# Patient Record
Sex: Female | Born: 1965 | Race: White | Hispanic: No | Marital: Married | Smoking: Never smoker
Health system: Southern US, Community
[De-identification: ages and names within clinical notes are randomized; demographics above are authoritative.]

## PROBLEM LIST (undated history)

## (undated) DIAGNOSIS — F0150 Vascular dementia without behavioral disturbance: Secondary | ICD-10-CM

## (undated) DIAGNOSIS — R51 Headache: Secondary | ICD-10-CM

## (undated) DIAGNOSIS — G459 Transient cerebral ischemic attack, unspecified: Secondary | ICD-10-CM

## (undated) DIAGNOSIS — F0390 Unspecified dementia without behavioral disturbance: Secondary | ICD-10-CM

## (undated) DIAGNOSIS — I69853 Hemiplegia and hemiparesis following other cerebrovascular disease affecting right non-dominant side: Secondary | ICD-10-CM

## (undated) DIAGNOSIS — F319 Bipolar disorder, unspecified: Secondary | ICD-10-CM

## (undated) DIAGNOSIS — I10 Essential (primary) hypertension: Secondary | ICD-10-CM

## (undated) DIAGNOSIS — I69390 Apraxia following cerebral infarction: Secondary | ICD-10-CM

## (undated) DIAGNOSIS — F329 Major depressive disorder, single episode, unspecified: Secondary | ICD-10-CM

## (undated) DIAGNOSIS — E119 Type 2 diabetes mellitus without complications: Secondary | ICD-10-CM

## (undated) DIAGNOSIS — R1310 Dysphagia, unspecified: Secondary | ICD-10-CM

## (undated) DIAGNOSIS — I69320 Aphasia following cerebral infarction: Secondary | ICD-10-CM

## (undated) DIAGNOSIS — F419 Anxiety disorder, unspecified: Secondary | ICD-10-CM

## (undated) DIAGNOSIS — I639 Cerebral infarction, unspecified: Secondary | ICD-10-CM

## (undated) DIAGNOSIS — D649 Anemia, unspecified: Secondary | ICD-10-CM

## (undated) DIAGNOSIS — M6281 Muscle weakness (generalized): Secondary | ICD-10-CM

## (undated) DIAGNOSIS — E785 Hyperlipidemia, unspecified: Secondary | ICD-10-CM

## (undated) DIAGNOSIS — Z1612 Extended spectrum beta lactamase (ESBL) resistance: Secondary | ICD-10-CM

## (undated) DIAGNOSIS — I82409 Acute embolism and thrombosis of unspecified deep veins of unspecified lower extremity: Secondary | ICD-10-CM

## (undated) DIAGNOSIS — R519 Headache, unspecified: Secondary | ICD-10-CM

## (undated) HISTORY — PX: FOOT GANGLION EXCISION: SHX1660

## (undated) HISTORY — DX: Type 2 diabetes mellitus without complications: E11.9

## (undated) HISTORY — PX: VAGINAL HYSTERECTOMY: SUR661

## (undated) HISTORY — PX: HERNIA REPAIR: SHX51

## (undated) HISTORY — DX: Headache: R51

## (undated) HISTORY — DX: Headache, unspecified: R51.9

## (undated) HISTORY — PX: APPENDECTOMY: SHX54

## (undated) HISTORY — PX: CHOLECYSTECTOMY: SHX55

---

## 1998-08-19 ENCOUNTER — Encounter (HOSPITAL_COMMUNITY): Admission: RE | Admit: 1998-08-19 | Discharge: 1998-11-17 | Payer: Self-pay | Admitting: Obstetrics & Gynecology

## 1999-10-31 ENCOUNTER — Ambulatory Visit: Admission: RE | Admit: 1999-10-31 | Discharge: 1999-10-31 | Payer: Self-pay | Admitting: *Deleted

## 1999-10-31 ENCOUNTER — Encounter: Payer: Self-pay | Admitting: Internal Medicine

## 1999-11-07 ENCOUNTER — Encounter: Admission: RE | Admit: 1999-11-07 | Discharge: 1999-11-07 | Payer: Self-pay | Admitting: Internal Medicine

## 1999-11-07 ENCOUNTER — Encounter: Payer: Self-pay | Admitting: Internal Medicine

## 2010-05-13 ENCOUNTER — Other Ambulatory Visit: Payer: Self-pay | Admitting: Internal Medicine

## 2010-05-13 ENCOUNTER — Other Ambulatory Visit: Payer: Self-pay | Admitting: Gynecologic Oncology

## 2010-05-13 DIAGNOSIS — Z1231 Encounter for screening mammogram for malignant neoplasm of breast: Secondary | ICD-10-CM

## 2010-05-13 DIAGNOSIS — N644 Mastodynia: Secondary | ICD-10-CM

## 2010-05-16 ENCOUNTER — Other Ambulatory Visit: Payer: Self-pay

## 2010-05-20 ENCOUNTER — Ambulatory Visit
Admission: RE | Admit: 2010-05-20 | Discharge: 2010-05-20 | Disposition: A | Discharge status: Home / Self Care | Payer: BC Managed Care – PPO | Source: Ambulatory Visit | Attending: Internal Medicine | Admitting: Internal Medicine

## 2010-05-20 DIAGNOSIS — Z1231 Encounter for screening mammogram for malignant neoplasm of breast: Secondary | ICD-10-CM

## 2010-05-23 ENCOUNTER — Other Ambulatory Visit: Payer: Self-pay | Admitting: Internal Medicine

## 2010-05-23 DIAGNOSIS — R928 Other abnormal and inconclusive findings on diagnostic imaging of breast: Secondary | ICD-10-CM

## 2010-05-25 ENCOUNTER — Emergency Department: Payer: Self-pay | Admitting: Emergency Medicine

## 2010-05-30 ENCOUNTER — Ambulatory Visit
Admission: RE | Admit: 2010-05-30 | Discharge: 2010-05-30 | Disposition: A | Discharge status: Home / Self Care | Payer: BC Managed Care – PPO | Source: Ambulatory Visit | Attending: Internal Medicine | Admitting: Internal Medicine

## 2010-05-30 DIAGNOSIS — R928 Other abnormal and inconclusive findings on diagnostic imaging of breast: Secondary | ICD-10-CM

## 2013-10-29 ENCOUNTER — Emergency Department: Payer: Self-pay | Admitting: Emergency Medicine

## 2013-10-29 LAB — CBC WITH DIFFERENTIAL/PLATELET
BASOS ABS: 0 10*3/uL (ref 0.0–0.1)
Basophil %: 0.3 %
Eosinophil #: 0.1 10*3/uL (ref 0.0–0.7)
Eosinophil %: 1.1 %
HCT: 38.9 % (ref 35.0–47.0)
HGB: 12.9 g/dL (ref 12.0–16.0)
Lymphocyte #: 1.5 10*3/uL (ref 1.0–3.6)
Lymphocyte %: 18.4 %
MCH: 28.3 pg (ref 26.0–34.0)
MCHC: 33.1 g/dL (ref 32.0–36.0)
MCV: 85 fL (ref 80–100)
MONO ABS: 0.5 x10 3/mm (ref 0.2–0.9)
MONOS PCT: 6.4 %
NEUTROS PCT: 73.8 %
Neutrophil #: 6 10*3/uL (ref 1.4–6.5)
PLATELETS: 248 10*3/uL (ref 150–440)
RBC: 4.56 10*6/uL (ref 3.80–5.20)
RDW: 13.4 % (ref 11.5–14.5)
WBC: 8.1 10*3/uL (ref 3.6–11.0)

## 2013-10-29 LAB — URINALYSIS, COMPLETE
Bacteria: NONE SEEN
Bilirubin,UR: NEGATIVE
KETONE: NEGATIVE
LEUKOCYTE ESTERASE: NEGATIVE
Nitrite: NEGATIVE
Ph: 6 (ref 4.5–8.0)
RBC,UR: 19 /HPF (ref 0–5)
Specific Gravity: 1.02 (ref 1.003–1.030)
Squamous Epithelial: 1
WBC UR: 15 /HPF (ref 0–5)

## 2013-10-29 LAB — COMPREHENSIVE METABOLIC PANEL
ALBUMIN: 3.1 g/dL — AB (ref 3.4–5.0)
ALK PHOS: 96 U/L
ALT: 17 U/L
Anion Gap: 8 (ref 7–16)
BILIRUBIN TOTAL: 0.4 mg/dL (ref 0.2–1.0)
BUN: 13 mg/dL (ref 7–18)
CALCIUM: 8.9 mg/dL (ref 8.5–10.1)
CREATININE: 0.96 mg/dL (ref 0.60–1.30)
Chloride: 105 mmol/L (ref 98–107)
Co2: 29 mmol/L (ref 21–32)
GLUCOSE: 80 mg/dL (ref 65–99)
Osmolality: 282 (ref 275–301)
POTASSIUM: 3.9 mmol/L (ref 3.5–5.1)
SGOT(AST): 23 U/L (ref 15–37)
SODIUM: 142 mmol/L (ref 136–145)
Total Protein: 7.8 g/dL (ref 6.4–8.2)

## 2013-10-29 LAB — LIPASE, BLOOD: Lipase: 51 U/L — ABNORMAL LOW (ref 73–393)

## 2013-10-31 LAB — URINE CULTURE

## 2014-10-19 ENCOUNTER — Emergency Department: Payer: 59

## 2014-10-19 DIAGNOSIS — R51 Headache: Secondary | ICD-10-CM | POA: Diagnosis not present

## 2014-10-19 DIAGNOSIS — H53149 Visual discomfort, unspecified: Secondary | ICD-10-CM | POA: Diagnosis not present

## 2014-10-19 DIAGNOSIS — I159 Secondary hypertension, unspecified: Secondary | ICD-10-CM | POA: Diagnosis not present

## 2014-10-19 DIAGNOSIS — R110 Nausea: Secondary | ICD-10-CM | POA: Diagnosis not present

## 2014-10-19 MED ORDER — HYDROCODONE-ACETAMINOPHEN 5-325 MG PO TABS
1.0000 | ORAL_TABLET | Freq: Once | ORAL | Status: AC
  Administered 2014-10-19: 1 via ORAL

## 2014-10-19 MED ORDER — ONDANSETRON 4 MG PO TBDP
4.0000 mg | ORAL_TABLET | Freq: Once | ORAL | Status: AC
  Administered 2014-10-19: 4 mg via ORAL

## 2014-10-19 MED ORDER — HYDROCODONE-ACETAMINOPHEN 5-325 MG PO TABS
ORAL_TABLET | ORAL | Status: AC
  Administered 2014-10-19: 1 via ORAL
  Filled 2014-10-19: qty 1

## 2014-10-19 MED ORDER — ONDANSETRON 4 MG PO TBDP
ORAL_TABLET | ORAL | Status: AC
  Administered 2014-10-19: 4 mg via ORAL
  Filled 2014-10-19: qty 1

## 2014-10-19 NOTE — ED Notes (Signed)
Patient reports history of hypertension and states she has been taking medications as prescribed.

## 2014-10-19 NOTE — ED Notes (Signed)
Reports headache all day and not relieved by over the counter meds.  Reports headache pain goes into her jaw.

## 2014-10-20 ENCOUNTER — Emergency Department
Admission: EM | Admit: 2014-10-20 | Discharge: 2014-10-20 | Disposition: A | Discharge status: Home / Self Care | Payer: 59 | Attending: Emergency Medicine | Admitting: Emergency Medicine

## 2014-10-20 DIAGNOSIS — I159 Secondary hypertension, unspecified: Secondary | ICD-10-CM

## 2014-10-20 DIAGNOSIS — R519 Headache, unspecified: Secondary | ICD-10-CM

## 2014-10-20 DIAGNOSIS — R51 Headache: Secondary | ICD-10-CM

## 2014-10-20 MED ORDER — DIPHENHYDRAMINE HCL 50 MG/ML IJ SOLN
INTRAMUSCULAR | Status: AC
  Filled 2014-10-20: qty 1

## 2014-10-20 MED ORDER — DIPHENHYDRAMINE HCL 50 MG/ML IJ SOLN: 25.0000 mg | Freq: Once | INTRAMUSCULAR | Status: DC

## 2014-10-20 MED ORDER — SODIUM CHLORIDE 0.9 % IV BOLUS (SEPSIS)
1000.0000 mL | Freq: Once | INTRAVENOUS | Status: AC
  Administered 2014-10-20: 1000 mL via INTRAVENOUS

## 2014-10-20 MED ORDER — PROCHLORPERAZINE EDISYLATE 5 MG/ML IJ SOLN
10.0000 mg | Freq: Four times a day (QID) | INTRAMUSCULAR | Status: DC | PRN
  Administered 2014-10-20: 10 mg via INTRAVENOUS
  Filled 2014-10-20: qty 2

## 2014-10-20 NOTE — ED Provider Notes (Signed)
[facility] Emergency Department Provider Note   ____________________________________________  Time seen: 15  I have reviewed the triage vital signs and the nursing notes.   HISTORY  Chief Complaint Headache   History limited by: Not Limited   HPI Kelly Romero is a 49 y.o. female who presents to the emergency department today because of concerns for headache. The headache started roughly 22 hours ago. She states it is located in the frontal area. It is throbbing. She does have sensitivity to light. She states it is progressively gotten worse since that time. She is tried on home meds without any relief. States she has a history of headaches although this one is severe. She denies any fevers. Has had some nausea without any vomiting.      No past medical history on file.  There are no active problems to display for this patient.   No past surgical history on file.  No current outpatient prescriptions on file.  Allergies Erythromycin  No family history on file.  Social History History  Substance Use Topics  . Smoking status: Not on file  . Smokeless tobacco: Not on file  . Alcohol Use: Not on file    Review of Systems  Constitutional: Negative for fever. Cardiovascular: Negative for chest pain. Respiratory: Negative for shortness of breath. Gastrointestinal: Negative for abdominal pain, vomiting and diarrhea. Genitourinary: Negative for dysuria. Musculoskeletal: Negative for back pain. Skin: Negative for rash. Neurological: Positive for headache  10-point ROS otherwise negative.  ____________________________________________   PHYSICAL EXAM:  VITAL SIGNS: ED Triage Vitals  Enc Vitals Group     BP 10/19/14 2256 187/129 mmHg     Pulse Rate 10/19/14 2256 76     Resp 10/19/14 2256 20     Temp 10/19/14 2256 97.9 F (36.6 C)     Temp Source 10/19/14 2256 Oral     SpO2 10/19/14 2256 98 %     Weight 10/19/14 2256 225 lb  (102.059 kg)     Height 10/19/14 2256 5\' 2"  (1.575 m)   Constitutional: Alert and oriented. Mildly uncomfortable. Shielding her eyes. Eyes: Conjunctivae are normal. PERRL. Normal extraocular movements. ENT   Head: Normocephalic and atraumatic.   Nose: No congestion/rhinnorhea.   Mouth/Throat: Mucous membranes are moist.   Neck: No stridor. Hematological/Lymphatic/Immunilogical: No cervical lymphadenopathy. Cardiovascular: Normal rate, regular rhythm.  No murmurs, rubs, or gallops. Respiratory: Normal respiratory effort without tachypnea nor retractions. Breath sounds are clear and equal bilaterally. No wheezes/rales/rhonchi. Gastrointestinal: Soft and nontender. No distention. Genitourinary: Deferred Musculoskeletal: Normal range of motion in all extremities. No joint effusions.  No lower extremity tenderness nor edema. Neurologic:  Normal speech and language. No gross focal neurologic deficits are appreciated. Speech is normal.  Skin:  Skin is warm, dry and intact. No rash noted. Psychiatric: Mood and affect are normal. Speech and behavior are normal. Patient exhibits appropriate insight and judgment.  ____________________________________________    LABS (pertinent positives/negatives)  None  ____________________________________________   EKG  None  ____________________________________________    RADIOLOGY  CT head IMPRESSION: Normal brain  ____________________________________________   PROCEDURES  Procedure(s) performed: None  Critical Care performed: No  ____________________________________________   INITIAL IMPRESSION / ASSESSMENT AND PLAN / ED COURSE  Pertinent labs & imaging results that were available during my care of the patient were reviewed by me and considered in my medical decision making (see chart for details).  Patient presents to the emergency department with concerns for headache and high blood pressure. On  exam patient does  appear mildly uncomfortable. She is shielding her eyes. No focal neuro deficits. At this point will plan on treating headache with medications and reassessment. Head CT was negative. I have low suspicion for intracranial bleed.  Patient did have a bad reaction to the Compazine however this did resolve spontaneously. Patient did state that she felt much better after the Compazine and some fluids. Will plan on discharging home.  ____________________________________________   FINAL CLINICAL IMPRESSION(S) / ED DIAGNOSES  Final diagnoses:  Headache, unspecified headache type  Secondary hypertension, unspecified     Phineas Semen, MD 10/20/14 [phone]

## 2014-10-20 NOTE — Discharge Instructions (Signed)
Please seek medical attention for any high fevers, chest pain, shortness of breath, change in behavior, persistent vomiting, bloody stool or any other new or concerning symptoms. ° °Hypertension °Hypertension, commonly called high blood pressure, is when the force of blood pumping through your arteries is too strong. Your arteries are the blood vessels that carry blood from your heart throughout your body. A blood pressure reading consists of a higher number over a lower number, such as 110/72. The higher number (systolic) is the pressure inside your arteries when your heart pumps. The lower number (diastolic) is the pressure inside your arteries when your heart relaxes. Ideally you want your blood pressure below 120/80. °Hypertension forces your heart to work harder to pump blood. Your arteries may become narrow or stiff. Having hypertension puts you at risk for heart disease, stroke, and other problems.  °RISK FACTORS °Some risk factors for high blood pressure are controllable. Others are not.  °Risk factors you cannot control include:  °· Race. You may be at higher risk if you are African American. °· Age. Risk increases with age. °· Gender. Men are at higher risk than women before age 45 years. After age 65, women are at higher risk than men. °Risk factors you can control include: °· Not getting enough exercise or physical activity. °· Being overweight. °· Getting too much fat, sugar, calories, or salt in your diet. °· Drinking too much alcohol. °SIGNS AND SYMPTOMS °Hypertension does not usually cause signs or symptoms. Extremely high blood pressure (hypertensive crisis) may cause headache, anxiety, shortness of breath, and nosebleed. °DIAGNOSIS  °To check if you have hypertension, your health care provider will measure your blood pressure while you are seated, with your arm held at the level of your heart. It should be measured at least twice using the same arm. Certain conditions can cause a difference in  blood pressure between your right and left arms. A blood pressure reading that is higher than normal on one occasion does not mean that you need treatment. If one blood pressure reading is high, ask your health care provider about having it checked again. °TREATMENT  °Treating high blood pressure includes making lifestyle changes and possibly taking medicine. Living a healthy lifestyle can help lower high blood pressure. You may need to change some of your habits. °Lifestyle changes may include: °· Following the DASH diet. This diet is high in fruits, vegetables, and whole grains. It is low in salt, red meat, and added sugars. °· Getting at least 2½ hours of brisk physical activity every week. °· Losing weight if necessary. °· Not smoking. °· Limiting alcoholic beverages. °· Learning ways to reduce stress. ° If lifestyle changes are not enough to get your blood pressure under control, your health care provider may prescribe medicine. You may need to take more than one. Work closely with your health care provider to understand the risks and benefits. °HOME CARE INSTRUCTIONS °· Have your blood pressure rechecked as directed by your health care provider.   °· Take medicines only as directed by your health care provider. Follow the directions carefully. Blood pressure medicines must be taken as prescribed. The medicine does not work as well when you skip doses. Skipping doses also puts you at risk for problems.   °· Do not smoke.   °· Monitor your blood pressure at home as directed by your health care provider.  °SEEK MEDICAL CARE IF:  °· You think you are having a reaction to medicines taken. °· You have recurrent headaches or feel   dizzy.  You have swelling in your ankles.  You have trouble with your vision. SEEK IMMEDIATE MEDICAL CARE IF:  You develop a severe headache or confusion.  You have unusual weakness, numbness, or feel faint.  You have severe chest or abdominal pain.  You vomit repeatedly.  You  have trouble breathing. MAKE SURE YOU:   Understand these instructions.  Will watch your condition.  Will get help right away if you are not doing well or get worse. Document Released: 03/02/2005 Document Revised: 07/17/2013 Document Reviewed: 12/23/2012 [facility] Patient Information 2015 [place], [place]. This information is not intended to replace advice given to you by your health care provider. Make sure you discuss any questions you have with your health care provider.  General Headache Without Cause A headache is pain or discomfort felt around the head or neck area. The specific cause of a headache may not be found. There are many causes and types of headaches. A few common ones are:  Tension headaches.  Migraine headaches.  Cluster headaches.  Chronic daily headaches. HOME CARE INSTRUCTIONS   Keep all follow-up appointments with your caregiver or any specialist referral.  Only take over-the-counter or prescription medicines for pain or discomfort as directed by your caregiver.  Lie down in a dark, quiet room when you have a headache.  Keep a headache journal to find out what may trigger your migraine headaches. For example, write down:  What you eat and drink.  How much sleep you get.  Any change to your diet or medicines.  Try massage or other relaxation techniques.  Put ice packs or heat on the head and neck. Use these 3 to 4 times per day for 15 to 20 minutes each time, or as needed.  Limit stress.  Sit up straight, and do not tense your muscles.  Quit smoking if you smoke.  Limit alcohol use.  Decrease the amount of caffeine you drink, or stop drinking caffeine.  Eat and sleep on a regular schedule.  Get 7 to 9 hours of sleep, or as recommended by your caregiver.  Keep lights dim if bright lights bother you and make your headaches worse. SEEK MEDICAL CARE IF:   You have problems with the medicines you were prescribed.  Your medicines are not  working.  You have a change from the usual headache.  You have nausea or vomiting. SEEK IMMEDIATE MEDICAL CARE IF:   Your headache becomes severe.  You have a fever.  You have a stiff neck.  You have loss of vision.  You have muscular weakness or loss of muscle control.  You start losing your balance or have trouble walking.  You feel faint or pass out.  You have severe symptoms that are different from your first symptoms. MAKE SURE YOU:   Understand these instructions.  Will watch your condition.  Will get help right away if you are not doing well or get worse. Document Released: 03/02/2005 Document Revised: 05/25/2011 Document Reviewed: 03/18/2011 [facility] Patient Information 2015 [place], [place]. This information is not intended to replace advice given to you by your health care provider. Make sure you discuss any questions you have with your health care provider.

## 2014-11-02 ENCOUNTER — Other Ambulatory Visit: Payer: 59

## 2014-11-02 ENCOUNTER — Ambulatory Visit (INDEPENDENT_AMBULATORY_CARE_PROVIDER_SITE_OTHER): Payer: 59 | Admitting: Neurology

## 2014-11-02 ENCOUNTER — Encounter: Payer: Self-pay | Admitting: Neurology

## 2014-11-02 VITALS — BP 140/86 | HR 84 | Resp 16 | Ht 65.0 in | Wt 227.1 lb

## 2014-11-02 DIAGNOSIS — R51 Headache: Secondary | ICD-10-CM | POA: Diagnosis not present

## 2014-11-02 DIAGNOSIS — R519 Headache, unspecified: Secondary | ICD-10-CM

## 2014-11-02 LAB — CBC
HCT: 40.9 % (ref 36.0–46.0)
HEMOGLOBIN: 13.2 g/dL (ref 12.0–15.0)
MCH: 27.7 pg (ref 26.0–34.0)
MCHC: 32.3 g/dL (ref 30.0–36.0)
MCV: 85.9 fL (ref 78.0–100.0)
MPV: 11.3 fL (ref 8.6–12.4)
Platelets: 295 10*3/uL (ref 150–400)
RBC: 4.76 MIL/uL (ref 3.87–5.11)
RDW: 14.1 % (ref 11.5–15.5)
WBC: 6 10*3/uL (ref 4.0–10.5)

## 2014-11-02 LAB — COMPLETE METABOLIC PANEL WITH GFR
ALT: 11 U/L (ref 6–29)
AST: 14 U/L (ref 10–35)
Albumin: 3.5 g/dL — ABNORMAL LOW (ref 3.6–5.1)
Alkaline Phosphatase: 86 U/L (ref 33–130)
BUN: 14 mg/dL (ref 7–25)
CHLORIDE: 101 mmol/L (ref 98–110)
CO2: 29 mmol/L (ref 20–31)
Calcium: 9.1 mg/dL (ref 8.6–10.4)
Creat: 0.7 mg/dL (ref 0.50–1.05)
GFR, Est African American: >89 mL/min (ref >=60)
GFR, Est Non African American: >89 mL/min (ref >=60)
GLUCOSE: 201 mg/dL — AB (ref 65–99)
POTASSIUM: 4.3 mmol/L (ref 3.5–5.3)
SODIUM: 139 mmol/L (ref 135–146)
Total Bilirubin: 0.6 mg/dL (ref 0.2–1.2)
Total Protein: 6.5 g/dL (ref 6.1–8.1)

## 2014-11-02 MED ORDER — NORTRIPTYLINE HCL 25 MG PO CAPS: 25.0000 mg | ORAL_CAPSULE | Freq: Every day | ORAL | Status: DC

## 2014-11-02 NOTE — Patient Instructions (Addendum)
Start nortriptyline  at bedtime.  Call in 4 weeks with update Will check MRI of brain with and without contrast and MRA of head and neck [facility]  11/09/14 2:45pm  Check CBC, CMP, Sed Rate, ANA, ANCA, ENA Follow up in 8 weeks

## 2014-11-02 NOTE — Progress Notes (Signed)
NEUROLOGY CONSULTATION NOTE  ROGELIO WINBUSH MRN: [id number] DOB: 13-Jun-1965  Referring provider: Dr. Jeanie Sewer Primary care provider: Dr. Jeanie Sewer  Reason for consult:  headache  HISTORY OF PRESENT ILLNESS: Kelly Romero is a 49 year old right-handed female with history of uncontrolled diabetes, hypertension and depression who presents for new onset headache.  History obtained from patient and ED note.  CT image of head personally reviewed.  Onset:  3 weeks ago.  Sudden onset. Location:  Right sided, from front to back of head.  Initially had pain behind right ear. Quality:  squeezing Intensity:  7-8/10 Aura:  no Prodrome:  no Associated symptoms:  Some photophobia and phonophobia.  Sometimes nausea.  She denies visual disturbance. Duration:  All day Frequency:  Daily.  Sometimes wakes her up at night Triggers/exacerbating factors:  Loud noise Relieving factors:  Laying in a cool dark quiet room Activity:  6 days out of last 3 weeks, unable to function  Past abortive medication:  Tylenol, Excedrin, Percocet Past preventative medication:  none Other past therapy:  none  Current abortive medication:  Ibuprofen  Antihypertensive medications:  Lisinopril, metoprolol Antidepressant medications:  lexapro  Anticonvulsant medications:  none Vitamins/Herbal/Supplements:  none Other therapy:  none  She went to [facility] ED on 10/20/14 about 22 hours after onset of headache.  Her blood pressure was elevated at 187/129.  CT of the head was performed and was normal.  She was treated with Benadryl and Compazine. She reports that she fell and hit the right side of her head in April.  There was no loss of consciousness but it was a hard fall. She has uncontrolled diabetes.  Blood sugars are in the 200s.  She has not been hypoglycemic.  Blood pressure has been running higher lately.  Caffeine:  Coffee, cola Alcohol:  seldom Smoker:  no Diet:  Not strict diet Exercise:   no Depression/stress:  stress Sleep hygiene:  varies Family history of headache:  No.  No family history of aneurysm She has had occasional tension-type headache, but no history of migraine.  PAST MEDICAL HISTORY: Past Medical History  Diagnosis Date  . Diabetes mellitus without complication   . Headache     PAST SURGICAL HISTORY: Past Surgical History  Procedure Laterality Date  . Cholecystectomy    . Hernia repair    . Appendectomy    . Foot ganglion excision    . Vaginal hysterectomy      MEDICATIONS: No current outpatient prescriptions on file prior to visit.   No current facility-administered medications on file prior to visit.    ALLERGIES: Allergies  Allergen Reactions  . Erythromycin Itching    FAMILY HISTORY: Family History  Problem Relation Age of Onset  . COPD Mother   . Stroke Father   . Heart failure Brother   . Cancer Maternal Grandmother     unknown   . COPD    . Heart failure Maternal Grandfather   . Hypertension Maternal Grandfather   . Cancer Paternal Grandfather     lung  . Diabetes Paternal Grandmother     SOCIAL HISTORY: Social History   Social History  . Marital Status: Married    Spouse Name: N/A  . Number of Children: N/A  . Years of Education: N/A   Occupational History  . Not on file.   Social History Main Topics  . Smoking status: Never Smoker   . Smokeless tobacco: Never Used  . Alcohol Use: 0.0 oz/week    0  Standard drinks or equivalent per week     Comment: rare   . Drug Use: No  . Sexual Activity: No   Other Topics Concern  . Not on file   Social History Narrative  . No narrative on file    REVIEW OF SYSTEMS: Constitutional: No fevers, chills, or sweats, no generalized fatigue, change in appetite Eyes: No visual changes, double vision, eye pain Ear, nose and throat: No hearing loss, ear pain, nasal congestion, sore throat Cardiovascular: No chest pain, palpitations Respiratory:  No shortness of breath  at rest or with exertion, wheezes GastrointestinaI: No nausea, vomiting, diarrhea, abdominal pain, fecal incontinence Genitourinary:  No dysuria, urinary retention or frequency Musculoskeletal:  No neck pain, back pain Integumentary: No rash, pruritus, skin lesions Neurological: as above Psychiatric: No depression, insomnia, anxiety Endocrine: No palpitations, fatigue, diaphoresis, mood swings, change in appetite, change in weight, increased thirst Hematologic/Lymphatic:  No anemia, purpura, petechiae. Allergic/Immunologic: no itchy/runny eyes, nasal congestion, recent allergic reactions, rashes  PHYSICAL EXAM: Filed Vitals:   11/02/14 1255  BP: 140/86  Pulse: 84  Resp: 16   General: No acute distress.  Patient appears well-groomed.   Head:  Normocephalic/atraumatic Eyes:  fundi unremarkable, without vessel changes, exudates, hemorrhages or papilledema. Neck: supple, right sided tenderness to palpation, full range of motion Back: No paraspinal tenderness Heart: regular rate and rhythm Lungs: Clear to auscultation bilaterally. Vascular: No carotid bruits. Neurological Exam: Mental status: alert and oriented to person, place, and time, recent and remote memory intact, fund of knowledge intact, attention and concentration intact, speech fluent and not dysarthric, language intact. Cranial nerves: CN I: not tested CN II: pupils equal, round and reactive to light, visual fields intact, fundi unremarkable, without vessel changes, exudates, hemorrhages or papilledema. CN III, IV, VI:  full range of motion, no nystagmus, no ptosis CN V: facial sensation intact CN VII: upper and lower face symmetric CN VIII: hearing intact CN IX, X: gag intact, uvula midline CN XI: sternocleidomastoid and trapezius muscles intact CN XII: tongue midline Bulk & Tone: normal, no fasciculations. Motor:  5/5 throughout Sensation:  Pinprick intact.  Mildly reduced vibration sensation in toes. Deep Tendon  Reflexes:  1+ in upper extremities, absent in lower extremities.  Toes downgoing. Finger to nose testing:  intact Heel to shin:  intact Gait:  Normal station and stride.  Able to turn and walk in tandem. Romberg negative.  IMPRESSION: New onset right sided headache.  No history of migraine.  Differential includes hemicrania continua Elevated blood pressure, may be related to pain.  PLAN: 1.  Will start nortriptyline 25mg  at bedtime 2.  Check MRI of brain with and without contrast and MRA of head and neck to look for tumor, aneurysm or arterial dissection for new onset unilateral headache that wakes her up at night 3.  Check CBC, CMP, Sed Rate, ANA, ANCA, ENA 4.  BP should be rechecked with PCP 5.  Follow up in 8 weeks  Thank you for allowing me to take part in the care of this patient.  Shon Millet, DO  CC:  Gwendlyn Deutscher, MD

## 2014-11-03 LAB — SEDIMENTATION RATE: Sed Rate: 30 mm/hr — ABNORMAL HIGH (ref 0–20)

## 2014-11-05 LAB — ANA: Anti Nuclear Antibody(ANA): NEGATIVE

## 2014-11-05 LAB — ANCA SCREEN W REFLEX TITER: ANCA Screen: NEGATIVE

## 2014-11-09 ENCOUNTER — Telehealth: Payer: Self-pay | Admitting: Neurology

## 2014-11-09 ENCOUNTER — Ambulatory Visit (HOSPITAL_COMMUNITY)
Admission: RE | Admit: 2014-11-09 | Discharge: 2014-11-09 | Disposition: A | Discharge status: Home / Self Care | Payer: 59 | Source: Ambulatory Visit | Attending: Neurology | Admitting: Neurology

## 2014-11-09 ENCOUNTER — Other Ambulatory Visit (HOSPITAL_COMMUNITY): Payer: 59

## 2014-11-09 DIAGNOSIS — R51 Headache: Principal | ICD-10-CM

## 2014-11-09 DIAGNOSIS — R519 Headache, unspecified: Secondary | ICD-10-CM

## 2014-11-09 NOTE — Telephone Encounter (Signed)
Pt called and said that she couldn't relax for the MRI @ [facility]/so it wasn't done/ they suggested that she have it done @ [facility] instead?call back @ [phone]

## 2014-11-09 NOTE — Telephone Encounter (Signed)
I spoke with patient and told her that I would set up next MRI at [facility].  She also requested valium.

## 2014-11-12 NOTE — Telephone Encounter (Signed)
I called patient and informed her that I am having trouble getting through to [facility] so I have faxed referral to them and they should be calling her to set up the appointment.  Rx called in for Valium 5 mg.  Patient instructed to have a Zaveon Gillen for MRI.

## 2014-11-12 NOTE — Telephone Encounter (Signed)
Please advise about valium.  Thanks.

## 2014-11-12 NOTE — Telephone Encounter (Signed)
She can have  valium to take 15-30 minutes prior to MRI.  She will need a driver.

## 2014-11-13 ENCOUNTER — Telehealth: Payer: Self-pay | Admitting: *Deleted

## 2014-11-13 NOTE — Telephone Encounter (Signed)
Patient is aware that labs were normal

## 2014-11-16 ENCOUNTER — Telehealth: Payer: Self-pay | Admitting: Neurology

## 2014-11-16 NOTE — Telephone Encounter (Signed)
I spoke with patient and told her Morrie Sheldon would follow up with her regarding MRI at [facility]

## 2014-11-16 NOTE — Telephone Encounter (Signed)
Pt called concerning/ an MRI to be done at [facility]/ call back @ [phone]

## 2014-12-04 ENCOUNTER — Telehealth: Payer: Self-pay | Admitting: Neurology

## 2014-12-04 DIAGNOSIS — I639 Cerebral infarction, unspecified: Secondary | ICD-10-CM

## 2014-12-04 NOTE — Telephone Encounter (Signed)
Pt called and wanted to know the results of her MRI/[name] CB# [phone]

## 2014-12-04 NOTE — Telephone Encounter (Signed)
I had left a message for patient to call me back.  She returned my call and we discussed MRI results.  It showed chronic small vessel ischemic changes, which correlate with her co-morbidities of diabetes, hypertension, and hyperlipidemia.  However, it did show a probable tiny subacute stroke, which is likely incidental.  Anyway, it was recommended to repeat MRI of brain with and without contrast in 3 months for abnormal white matter on MRI of brain.  She is already on ASA and Crestor.  I also would like to check a fasting lipid panel for stroke.

## 2014-12-05 ENCOUNTER — Telehealth: Payer: Self-pay | Admitting: Neurology

## 2014-12-05 ENCOUNTER — Encounter: Payer: Self-pay | Admitting: Neurology

## 2014-12-05 NOTE — Telephone Encounter (Signed)
Pt called and needs a note of no restrictions for place of employment/[name] CB# [phone]  Work Fax# [phone]

## 2014-12-05 NOTE — Telephone Encounter (Signed)
Patient advised. Faxed letter. [facility]

## 2014-12-05 NOTE — Telephone Encounter (Signed)
Spoke with pt. Pt. Will have lab work done and will follow up with [place] in October. Thanks

## 2014-12-05 NOTE — Telephone Encounter (Signed)
Letter drafted and is in [facility]

## 2014-12-14 ENCOUNTER — Telehealth: Payer: Self-pay | Admitting: Neurology

## 2014-12-14 NOTE — Telephone Encounter (Signed)
PT called and would like a call back in regards to see if Dr Everlena Cooper wanted to see her before she went back to work, was in the hospital/[name] CB# [phone]

## 2014-12-14 NOTE — Telephone Encounter (Signed)
Dr Jaffe-  Please advise 

## 2014-12-17 NOTE — Telephone Encounter (Signed)
I have no information regarding why she was in the hospital.

## 2014-12-17 NOTE — Telephone Encounter (Signed)
Put her records in accordion  File folder under [address] per Dr Everlena Cooper

## 2014-12-17 NOTE — Telephone Encounter (Signed)
Called Marcelino Duster back from Medical Records. Will fax letter head over to [facility] for records.. Waiting on call back for fax number

## 2014-12-17 NOTE — Telephone Encounter (Signed)
Dr Everlena Cooper Patient has an appointment on 10/14 and was in [facility] ED patient felt like she was having a stroke  But was not diagnosed with a stroke  Just having symptoms   She was having high blood sugar and hypertension and headache   Dr Everlena Cooper , patient will bring those records with her to her appointment on October the 14th wants to discuss going back to work , she does not feel like she is able to work at this point until she sees you at your appointment. I explained to her that we CAN NOT give her a note out of work until you review her records. That it is her choice to be out of work at this time

## 2014-12-17 NOTE — Telephone Encounter (Signed)
Contacted patient... She was at [facility]  Will needs records for Dr Everlena Cooper to review

## 2014-12-17 NOTE — Telephone Encounter (Signed)
Marcelino Duster with [facility] in the medical records department called in regards to PT coming in to see Dr Everlena Cooper and asked if she could get something in writing to send her medical records to us/[name] CB# [phone]

## 2014-12-17 NOTE — Telephone Encounter (Signed)
Marcelino Duster called back with fax number  [phone]  Faxed letterhead to her for patients records from [facility]  For Dr Moises Blood review before her appointment on 10-14

## 2014-12-19 ENCOUNTER — Ambulatory Visit: Payer: 59 | Admitting: Neurology

## 2014-12-28 ENCOUNTER — Ambulatory Visit (INDEPENDENT_AMBULATORY_CARE_PROVIDER_SITE_OTHER): Payer: 59 | Admitting: Neurology

## 2014-12-28 ENCOUNTER — Encounter: Payer: Self-pay | Admitting: Neurology

## 2014-12-28 VITALS — BP 128/70 | HR 78 | Ht 65.0 in | Wt 225.0 lb

## 2014-12-28 DIAGNOSIS — G9341 Metabolic encephalopathy: Secondary | ICD-10-CM | POA: Diagnosis not present

## 2014-12-28 DIAGNOSIS — IMO0002 Reserved for concepts with insufficient information to code with codable children: Secondary | ICD-10-CM | POA: Insufficient documentation

## 2014-12-28 DIAGNOSIS — E1165 Type 2 diabetes mellitus with hyperglycemia: Secondary | ICD-10-CM

## 2014-12-28 DIAGNOSIS — E1142 Type 2 diabetes mellitus with diabetic polyneuropathy: Secondary | ICD-10-CM | POA: Diagnosis not present

## 2014-12-28 DIAGNOSIS — I161 Hypertensive emergency: Secondary | ICD-10-CM | POA: Diagnosis not present

## 2014-12-28 DIAGNOSIS — I10 Essential (primary) hypertension: Secondary | ICD-10-CM | POA: Diagnosis not present

## 2014-12-28 DIAGNOSIS — I639 Cerebral infarction, unspecified: Secondary | ICD-10-CM

## 2014-12-28 NOTE — Patient Instructions (Signed)
1.  Continue propranolol 30mg .  Call in 4 weeks with update of headaches 2.  Will get 2D echocardiogram and carotid doppler 3.  Will get results of lipid panel 4.  Will get updated medication list from Dr. Jeanie Seweredding 5.  Must get diabetes under control 6.  Discuss with Dr. Jeanie Seweredding regarding chest discomfort 7.  Follow up in 3 months.

## 2014-12-28 NOTE — Progress Notes (Signed)
Note routed

## 2014-12-28 NOTE — Progress Notes (Signed)
NEUROLOGY FOLLOW UP OFFICE NOTE  QUANDA PAVLICEK [id number]  HISTORY OF PRESENT ILLNESS: Kelly Romero is a 49 year old right-handed female with history of uncontrolled diabetes, hypertension and depression who follows up for headache and episode of altered mental status.  History obtained by patient, daughter and hospital notes.  Images of brain MRI, reports of head CT and labs reviewed.  UPDATE:  Sed Rate was 30.  CBC and CMP were unremarkable except for elevated glucose of 201.  MRI of the brain from 11/09/14 showed chronic small vessel ischemic changes with an incidental tiny subacute infarct in the right frontal lobe.  She reportedly had a lipid panel done, but I don't have those results.  She was in [facility] on 12/07/14 for stroke symptoms.  She was going to the funeral of her ex-sister in law, who was like a real sister.  She took her death hard.  She developed slurred speech and right arm numbness.   She was agitated, yelling and screaming in the ED.  She was given Ativan and Haldol.  CT of head was unremarkable.  Repeat CT of head two days later was negative.  CXR negative.  Urine tox screen was positive for opioids.  She was found to have severally elevated blood pressure with systolic at 240 and higher.  She was treated with IV Labetolol and Vasotec.  Hgb A1c was 12 with serum glucose level of 400.  She did not have fever or leukocytosis, and UA was borderline with trace leukocytes but negative nitrite.  She was treated with 5 days of IV Rocephin.  Troponins were negative.  Etiology was unknown but TIA was suspected as a possibility.  Headaches have improved, but they are still present.  She never started nortriptyline.  Her PCP just increased her propranolol from  to  daily.  Sometimes, she notes numbness in the left arm and leg.  She also notes some chest discomfort.  HISTORY: Onset:  July.  Sudden onset. Location:  Right sided, from front to back of head.   Initially had pain behind right ear. Quality:  squeezing Initial intensity:  7-8/10 Aura:  no Prodrome:  no Associated symptoms:  Some photophobia and phonophobia.  Sometimes nausea.  She denies visual disturbance. Initial Duration:  All day Initial Frequency:  Daily.  Sometimes wakes her up at night Triggers/exacerbating factors:  Loud noise Relieving factors:  Laying in a cool dark quiet room Activity:  6 days out of last 3 weeks, unable to function  Past abortive medication:  Tylenol, Excedrin, Percocet Past preventative medication:  none Other past therapy:  none  Current abortive medication:  Ibuprofen  Antihypertensive medications:  Lisinopril, metoprolol Antidepressant medications:  lexapro  Anticonvulsant medications:  none Vitamins/Herbal/Supplements:  none Other therapy:  none  She went to [facility] ED on 10/20/14 about 22 hours after onset of headache.  Her blood pressure was elevated at 187/129.  CT of the head was performed and was normal.  She was treated with Benadryl and Compazine. She reports that she fell and hit the right side of her head in April.  There was no loss of consciousness but it was a hard fall. She has uncontrolled diabetes.  Blood sugars are in the 200s.  She has not been hypoglycemic.  Blood pressure has been running higher lately.  PAST MEDICAL HISTORY: Past Medical History  Diagnosis Date  . Diabetes mellitus without complication (HCC)   . Headache     MEDICATIONS: Current Outpatient Prescriptions  on File Prior to Visit  Medication Sig Dispense Refill  . aspirin EC 81 MG tablet Take 81 mg by mouth.    . escitalopram (LEXAPRO) 10 MG tablet Take 10 mg by mouth.    . Insulin Degludec 100 UNIT/ML SOPN Inject into the skin.    Marland Kitchen lisinopril (PRINIVIL,ZESTRIL) 2.5 MG tablet Take 2.5 mg by mouth.    . metoprolol succinate (TOPROL-XL) 50 MG 24 hr tablet Take 50 mg by mouth daily. Take with or immediately following a meal.    . nortriptyline  (PAMELOR) 25 MG capsule Take 1 capsule (25 mg total) by mouth at bedtime. (Patient not taking: Reported on 12/28/2014) 30 capsule 2  . propranolol (INDERAL) 20 MG tablet Take 20 mg by mouth.    . rosuvastatin (CRESTOR) 10 MG tablet Take 10 mg by mouth.     No current facility-administered medications on file prior to visit.    ALLERGIES: Allergies  Allergen Reactions  . Erythromycin Itching    FAMILY HISTORY: Family History  Problem Relation Age of Onset  . COPD Mother   . Stroke Father   . Heart failure Brother   . Cancer Maternal Grandmother     unknown   . COPD    . Heart failure Maternal Grandfather   . Hypertension Maternal Grandfather   . Cancer Paternal Grandfather     lung  . Diabetes Paternal Grandmother     SOCIAL HISTORY: Social History   Social History  . Marital Status: Married    Spouse Name: N/A  . Number of Children: N/A  . Years of Education: N/A   Occupational History  . Not on file.   Social History Main Topics  . Smoking status: Never Smoker   . Smokeless tobacco: Never Used  . Alcohol Use: 0.0 oz/week    0 Standard drinks or equivalent per week     Comment: rare   . Drug Use: No  . Sexual Activity: No   Other Topics Concern  . Not on file   Social History Narrative    REVIEW OF SYSTEMS: Constitutional: No fevers, chills, or sweats, no generalized fatigue, change in appetite Eyes: No visual changes, double vision, eye pain Ear, nose and throat: No hearing loss, ear pain, nasal congestion, sore throat Cardiovascular: No chest pain, palpitations Respiratory:  No shortness of breath at rest or with exertion, wheezes GastrointestinaI: No nausea, vomiting, diarrhea, abdominal pain, fecal incontinence Genitourinary:  No dysuria, urinary retention or frequency Musculoskeletal:  No neck pain, back pain Integumentary: No rash, pruritus, skin lesions Neurological: as above Psychiatric: No depression, insomnia, anxiety Endocrine: No  palpitations, fatigue, diaphoresis, mood swings, change in appetite, change in weight, increased thirst Hematologic/Lymphatic:  No anemia, purpura, petechiae. Allergic/Immunologic: no itchy/runny eyes, nasal congestion, recent allergic reactions, rashes  PHYSICAL EXAM: Filed Vitals:   12/28/14 1058  BP: 128/70  Pulse: 78   General: No acute distress.  Patient appears well-groomed.   Head:  Normocephalic/atraumatic Eyes:  Fundoscopic exam unremarkable without vessel changes, exudates, hemorrhages or papilledema. Neck: supple, no paraspinal tenderness, full range of motion Heart:  Regular rate and rhythm Lungs:  Clear to auscultation bilaterally Back: No paraspinal tenderness Neurological Exam: alert and oriented to person, place, and time. Attention span and concentration intact, recent and remote memory intact, fund of knowledge intact.  Speech fluent and not dysarthric, language intact.  CN II-XII intact. Fundoscopic exam unremarkable without vessel changes, exudates, hemorrhages or papilledema.  Bulk and tone normal, muscle strength 5/5 throughout.  Sensation to temperature and vibration reduced in feet.  Deep tendon reflexes 2+ in upper extremities and absent in lower extremities, toes downgoing.  Finger to nose and heel to shin testing intact.  Gait normal.  IMPRESSION: Metabolic encephalopathy and hypertensive encephalopathy.  I really do not suspect a stroke, although it cannot be ruled out. CVA, as per incidental finding on MRI Unspecified headache Hypertension Uncontrolled type 2 diabetes.  PLAN: ASA Continue Crestor 10mg  daily. We will check and see if a lipid panel was performed.  If not, this will be done.  LDL goal should be less than 70. Check carotid doppler and 2D echo to complete stroke workup Glycemic and blood pressure control Propranolol 30mg  daily.  She is to call in 4 weeks with update of headache and we can make adjustments if needed. Advised that she contact Dr.  Jeanie Seweredding to inform him about her chest discomfort. Follow up in 3 months.  Shon MilletAdam Eulogia Dismore, DO  CC:  Gwendlyn DeutscherJohn [name], MD

## 2015-01-02 ENCOUNTER — Encounter (HOSPITAL_COMMUNITY): Payer: 59

## 2015-01-04 ENCOUNTER — Other Ambulatory Visit (HOSPITAL_COMMUNITY): Payer: 59

## 2015-01-04 ENCOUNTER — Inpatient Hospital Stay (HOSPITAL_COMMUNITY): Admission: RE | Admit: 2015-01-04 | Payer: 59 | Source: Ambulatory Visit

## 2015-01-07 ENCOUNTER — Other Ambulatory Visit: Payer: Self-pay | Admitting: Neurology

## 2015-01-07 DIAGNOSIS — G9341 Metabolic encephalopathy: Secondary | ICD-10-CM

## 2015-01-07 DIAGNOSIS — R4701 Aphasia: Secondary | ICD-10-CM

## 2015-01-07 DIAGNOSIS — I639 Cerebral infarction, unspecified: Secondary | ICD-10-CM

## 2015-01-07 DIAGNOSIS — R200 Anesthesia of skin: Secondary | ICD-10-CM

## 2015-01-09 ENCOUNTER — Other Ambulatory Visit: Payer: Self-pay | Admitting: Neurology

## 2015-01-09 ENCOUNTER — Encounter: Payer: Self-pay | Admitting: Neurology

## 2015-01-11 ENCOUNTER — Encounter: Payer: Self-pay | Admitting: Neurology

## 2015-01-14 ENCOUNTER — Ambulatory Visit (HOSPITAL_COMMUNITY)
Admission: RE | Admit: 2015-01-14 | Discharge: 2015-01-14 | Disposition: A | Discharge status: Home / Self Care | Payer: 59 | Source: Ambulatory Visit | Attending: Cardiology | Admitting: Cardiology

## 2015-01-14 ENCOUNTER — Other Ambulatory Visit: Payer: Self-pay

## 2015-01-14 ENCOUNTER — Telehealth: Payer: Self-pay

## 2015-01-14 ENCOUNTER — Ambulatory Visit (HOSPITAL_BASED_OUTPATIENT_CLINIC_OR_DEPARTMENT_OTHER): Payer: 59

## 2015-01-14 DIAGNOSIS — I5189 Other ill-defined heart diseases: Secondary | ICD-10-CM | POA: Insufficient documentation

## 2015-01-14 DIAGNOSIS — Q211 Atrial septal defect: Secondary | ICD-10-CM | POA: Insufficient documentation

## 2015-01-14 DIAGNOSIS — G9341 Metabolic encephalopathy: Secondary | ICD-10-CM

## 2015-01-14 DIAGNOSIS — E119 Type 2 diabetes mellitus without complications: Secondary | ICD-10-CM | POA: Insufficient documentation

## 2015-01-14 DIAGNOSIS — I161 Hypertensive emergency: Secondary | ICD-10-CM | POA: Diagnosis not present

## 2015-01-14 DIAGNOSIS — I6523 Occlusion and stenosis of bilateral carotid arteries: Secondary | ICD-10-CM | POA: Insufficient documentation

## 2015-01-14 DIAGNOSIS — I517 Cardiomegaly: Secondary | ICD-10-CM | POA: Diagnosis not present

## 2015-01-14 DIAGNOSIS — I071 Rheumatic tricuspid insufficiency: Secondary | ICD-10-CM | POA: Diagnosis not present

## 2015-01-14 DIAGNOSIS — I340 Nonrheumatic mitral (valve) insufficiency: Secondary | ICD-10-CM | POA: Insufficient documentation

## 2015-01-14 DIAGNOSIS — E1142 Type 2 diabetes mellitus with diabetic polyneuropathy: Secondary | ICD-10-CM

## 2015-01-14 DIAGNOSIS — I639 Cerebral infarction, unspecified: Secondary | ICD-10-CM | POA: Diagnosis not present

## 2015-01-14 DIAGNOSIS — I10 Essential (primary) hypertension: Secondary | ICD-10-CM

## 2015-01-14 DIAGNOSIS — R200 Anesthesia of skin: Secondary | ICD-10-CM | POA: Diagnosis not present

## 2015-01-14 DIAGNOSIS — R4701 Aphasia: Secondary | ICD-10-CM | POA: Diagnosis not present

## 2015-01-14 DIAGNOSIS — E1165 Type 2 diabetes mellitus with hyperglycemia: Secondary | ICD-10-CM

## 2015-01-14 NOTE — Telephone Encounter (Signed)
Message left on pt's vm

## 2015-01-14 NOTE — Telephone Encounter (Signed)
-----   Message from Drema DallasAdam R Jaffe, DO sent at 01/14/2015 12:05 PM EDT ----- 2D echo shows a small hole in the heart, which some people have.  However, it is nothing that would change current management for stroke prevention.

## 2015-01-15 ENCOUNTER — Telehealth: Payer: Self-pay

## 2015-01-15 DIAGNOSIS — R519 Headache, unspecified: Secondary | ICD-10-CM

## 2015-01-15 DIAGNOSIS — G9341 Metabolic encephalopathy: Secondary | ICD-10-CM

## 2015-01-15 DIAGNOSIS — R51 Headache: Secondary | ICD-10-CM

## 2015-01-15 NOTE — Telephone Encounter (Signed)
-----   Message from Drema DallasAdam R Jaffe, DO sent at 01/15/2015  2:03 PM EDT ----- Carotid doppler shows some narrowing of the right carotid artery.  Management would be continuing the aspirin and statin.  Is she taking Crestor?  Also, I would like to check a fasting lipid panel because I never received those results.

## 2015-01-15 NOTE — Telephone Encounter (Signed)
Patient aware.

## 2015-01-15 NOTE — Telephone Encounter (Signed)
Patient aware. Pt is taking Crestor.  Will fax lab order to PCP - Dr. Gwendlyn DeutscherJohn [name] [facility] [place] fax # [phone]. Patient will go get fasting lipid drawn.

## 2015-04-15 ENCOUNTER — Ambulatory Visit (INDEPENDENT_AMBULATORY_CARE_PROVIDER_SITE_OTHER): Payer: 59 | Admitting: Neurology

## 2015-04-15 ENCOUNTER — Telehealth: Payer: Self-pay

## 2015-04-15 ENCOUNTER — Encounter: Payer: Self-pay | Admitting: Neurology

## 2015-04-15 VITALS — BP 138/86 | HR 98 | Ht 64.5 in | Wt 230.0 lb

## 2015-04-15 DIAGNOSIS — G44219 Episodic tension-type headache, not intractable: Secondary | ICD-10-CM

## 2015-04-15 DIAGNOSIS — I639 Cerebral infarction, unspecified: Secondary | ICD-10-CM

## 2015-04-15 DIAGNOSIS — I10 Essential (primary) hypertension: Secondary | ICD-10-CM

## 2015-04-15 DIAGNOSIS — E1165 Type 2 diabetes mellitus with hyperglycemia: Secondary | ICD-10-CM

## 2015-04-15 DIAGNOSIS — E1142 Type 2 diabetes mellitus with diabetic polyneuropathy: Secondary | ICD-10-CM

## 2015-04-15 DIAGNOSIS — G250 Essential tremor: Secondary | ICD-10-CM

## 2015-04-15 NOTE — Progress Notes (Signed)
Chart forwarded.  

## 2015-04-15 NOTE — Progress Notes (Signed)
NEUROLOGY FOLLOW UP OFFICE NOTE  SHANNELLE Romero [id number]  HISTORY OF PRESENT ILLNESS: Kelly Romero is a 50 year old right-handed female with history of uncontrolled diabetes, hypertension and depression who follows up for headache and stroke.  History obtained by patient and hospital notes from [facility].  MRI report and labs reviewed.  UPDATE: She had a similar stroke-like episode on 02/22/15.  She woke up with headache and later developed slurred speech.  She was admitted to [facility], where MRI of brain revealed 2 mm left occipital subacute infarct.  2D echo was performed, which appeared unremarkable, but didn't make mention of a PFO.  Carotid doppler again showed no hemodynamically significant ICA stenosis.  Hypercoagulable panel, ANA, lupus panel, antiextractable nuclear antigen were negative.  LDL was 130.  I am uncertain how elevated was her blood pressure.  Plavix was added to her ASA.   At some point, propranolol was discontinued and she was started on amitriptyline  at bedtime.  Headaches are improved.  They occur once every 2 weeks and respond to ibuprofen , sometimes requiring hydrocodone.  HISTORY: Onset:  July.  Sudden onset. Location:  Right sided, from front to back of head.  Initially had pain behind right ear. Quality:  squeezing Initial intensity:  7-8/10 Aura:  no Prodrome:  no Associated symptoms:  Some photophobia and phonophobia.  Sometimes nausea.  She denies visual disturbance. Initial Duration:  All day Initial Frequency:  Daily.  Sometimes wakes her up at night Triggers/exacerbating factors:  Loud noise Relieving factors:  Laying in a cool dark quiet room Activity:  6 days out of last 3 weeks, unable to function  Past abortive medication:  Tylenol, Excedrin, Percocet Past preventative medication:  none Other past therapy:  none  Current abortive medication:  Ibuprofen  Antihypertensive medications:  Lisinopril,  metoprolol Antidepressant medications:  lexapro  Anticonvulsant medications:  none Vitamins/Herbal/Supplements:  none Other therapy:  none  Sed Rate was 30.  CBC and CMP were unremarkable except for elevated glucose of 201.  MRI of the brain from 11/09/14 showed chronic small vessel ischemic changes with an incidental tiny subacute infarct in the right frontal lobe.    Encephalopathy/Stroke: She was in [facility] on 12/07/14 for stroke symptoms.  She developed slurred speech and right arm numbness.   She was agitated, yelling and screaming in the ED.  She was given Ativan and Haldol.  CT of head was unremarkable.  Repeat CT of head two days later was negative.  CXR negative.  Urine tox screen was positive for opioids.  She was found to have severally elevated blood pressure with systolic at 240 and higher.  She was treated with IV Labetolol and Vasotec.  Hgb A1c was 12 with serum glucose level of 400.  She did not have fever or leukocytosis, and UA was borderline with trace leukocytes but negative nitrite.  She was treated with 5 days of IV Rocephin.  Troponins were negative.  Etiology was unknown but TIA was suspected as a possibility.  PAST MEDICAL HISTORY: Past Medical History  Diagnosis Date  . Diabetes mellitus without complication (HCC)   . Headache     MEDICATIONS: Current Outpatient Prescriptions on File Prior to Visit  Medication Sig Dispense Refill  . aspirin EC 81 MG tablet Take 81 mg by mouth.    . escitalopram (LEXAPRO) 10 MG tablet Take 10 mg by mouth.    . Insulin Degludec 100 UNIT/ML SOPN Inject into the skin.    Marland Kitchen  lisinopril (PRINIVIL,ZESTRIL) 2.5 MG tablet Take 2.5 mg by mouth.    . rosuvastatin (CRESTOR) 10 MG tablet Take 10 mg by mouth.     No current facility-administered medications on file prior to visit.    ALLERGIES: Allergies  Allergen Reactions  . Erythromycin Itching    FAMILY HISTORY: Family History  Problem Relation Age of Onset  . COPD  Mother   . Stroke Father   . Heart failure Brother   . Cancer Maternal Grandmother     unknown   . COPD    . Heart failure Maternal Grandfather   . Hypertension Maternal Grandfather   . Cancer Paternal Grandfather     lung  . Diabetes Paternal Grandmother     SOCIAL HISTORY: Social History   Social History  . Marital Status: Married    Spouse Name: N/A  . Number of Children: N/A  . Years of Education: N/A   Occupational History  . Not on file.   Social History Main Topics  . Smoking status: Never Smoker   . Smokeless tobacco: Never Used  . Alcohol Use: 0.0 oz/week    0 Standard drinks or equivalent per week     Comment: rare   . Drug Use: No  . Sexual Activity: No   Other Topics Concern  . Not on file   Social History Narrative    REVIEW OF SYSTEMS: Constitutional: No fevers, chills, or sweats, no generalized fatigue, change in appetite Eyes: No visual changes, double vision, eye pain Ear, nose and throat: No hearing loss, ear pain, nasal congestion, sore throat Cardiovascular: No chest pain, palpitations Respiratory:  No shortness of breath at rest or with exertion, wheezes GastrointestinaI: No nausea, vomiting, diarrhea, abdominal pain, fecal incontinence Genitourinary:  No dysuria, urinary retention or frequency Musculoskeletal:  No neck pain, back pain Integumentary: No rash, pruritus, skin lesions Neurological: as above Psychiatric: No depression, insomnia, anxiety Endocrine: No palpitations, fatigue, diaphoresis, mood swings, change in appetite, change in weight, increased thirst Hematologic/Lymphatic:  No anemia, purpura, petechiae. Allergic/Immunologic: no itchy/runny eyes, nasal congestion, recent allergic reactions, rashes  PHYSICAL EXAM: Filed Vitals:   04/15/15 1339  BP: 138/86  Pulse: 98   General: No acute distress.  Patient appears well-groomed.   Head:  Normocephalic/atraumatic Eyes:  Fundoscopic exam unremarkable without vessel changes,  exudates, hemorrhages or papilledema. Neck: supple, no paraspinal tenderness, full range of motion Heart:  Regular rate and rhythm Lungs:  Clear to auscultation bilaterally Back: No paraspinal tenderness Neurological Exam: alert and oriented to person, place, and time. Attention span and concentration intact, recent and remote memory intact, fund of knowledge intact.  Speech fluent and not dysarthric, language intact.  CN II-XII intact. Fundoscopic exam unremarkable without vessel changes, exudates, hemorrhages or papilledema.  Bulk and tone normal, muscle strength 5/5 throughout.  Sensation to light touch, temperature and vibration intact.  Deep tendon reflexes 2+ throughout, toes downgoing.  Finger to nose with postural and kinetic tremor, heel to shin testing intact.  Gait normal, Romberg negative.  IMPRESSION: CVA.  There is no mention of PFO on repeat TTE at [facility] Hypertension Uncontrolled type 2 diabetes mellitus Hyperlipidemia Probable tension type headache  PLAN: 1.  Continue ASA  daily and Plavix  daily.  In one month, we can discontinue ASA and just continue Plavix 2.  Continue Crestor.  Check fasting lipid panel in one month and adjust dose if needed (LDL goal less than 70) 3.  Will get TEE.  If a PFO  is confirmed, may consider referral to cardiology for PFO closure, since this is the second stroke and both occurred in different vascular territories 4.  Continue amitriptyline  daily for now 5.  Ibuprofen  for abortive therapy, try to avoid hydrocodone 6.  Monitor tremor for now. 7.  Optimize glycemic control and continue BP control 8.  Follow up in 6 months  Shon Millet, DO  CC: Gwendlyn Deutscher, MD

## 2015-04-15 NOTE — Telephone Encounter (Signed)
Received order from Neurology (Dr. Everlena Cooper) for TEE for TIA. Scheduled patient Friday, April 19, 2015 with Dr. Anne Fu.  Reviewed instructions in detail with patient. Instructional letter and e-mail sent per patient request. She understands to call with further questions or concerns.

## 2015-04-15 NOTE — Telephone Encounter (Signed)
Opened in error

## 2015-04-15 NOTE — Patient Instructions (Signed)
1. Try to avoid using the hydrocodone, Take ibfrophen for your migraines. No more than 2 days a week, to avoid rebound headaches.  2. Continue amtriptyline 25 mg.  3. Continue aspirin 81 mg and Plavix.  4. Try to get your Diabetes undercontrol 5. Follow up in 6 months.

## 2015-04-18 ENCOUNTER — Telehealth: Payer: Self-pay | Admitting: Cardiology

## 2015-04-18 NOTE — Telephone Encounter (Signed)
Reviewed instructions with patient who is aware she may have clear liquids including water, broth, sprite,ginger ale, black coffee, tea (no sugar), cranberry,grape, apple juice, jello (not red) and/or popsicles from clear juices up until 7 am.  She will keep her appt for TEE as scheduled for 04/19/2015.

## 2015-04-18 NOTE — Telephone Encounter (Signed)
Pt is having a procedure tomorrow at 2. She wants to know if you have anything earlier,pt is a diabetic. She wants to know if she can eat anything before procedure.

## 2015-04-19 ENCOUNTER — Encounter (HOSPITAL_COMMUNITY): Payer: Self-pay

## 2015-04-19 ENCOUNTER — Ambulatory Visit (HOSPITAL_COMMUNITY)
Admission: RE | Admit: 2015-04-19 | Discharge: 2015-04-19 | Disposition: A | Discharge status: Home / Self Care | Payer: 59 | Source: Ambulatory Visit | Attending: Cardiology | Admitting: Cardiology

## 2015-04-19 ENCOUNTER — Ambulatory Visit (HOSPITAL_BASED_OUTPATIENT_CLINIC_OR_DEPARTMENT_OTHER): Payer: 59

## 2015-04-19 ENCOUNTER — Encounter (HOSPITAL_COMMUNITY)
Admission: RE | Disposition: A | Discharge status: Home / Self Care | Payer: Self-pay | Source: Ambulatory Visit | Attending: Cardiology

## 2015-04-19 DIAGNOSIS — Z8673 Personal history of transient ischemic attack (TIA), and cerebral infarction without residual deficits: Secondary | ICD-10-CM | POA: Diagnosis not present

## 2015-04-19 DIAGNOSIS — Z79899 Other long term (current) drug therapy: Secondary | ICD-10-CM | POA: Diagnosis not present

## 2015-04-19 DIAGNOSIS — I639 Cerebral infarction, unspecified: Secondary | ICD-10-CM | POA: Diagnosis not present

## 2015-04-19 DIAGNOSIS — F329 Major depressive disorder, single episode, unspecified: Secondary | ICD-10-CM | POA: Insufficient documentation

## 2015-04-19 DIAGNOSIS — E785 Hyperlipidemia, unspecified: Secondary | ICD-10-CM | POA: Insufficient documentation

## 2015-04-19 DIAGNOSIS — E1165 Type 2 diabetes mellitus with hyperglycemia: Secondary | ICD-10-CM | POA: Insufficient documentation

## 2015-04-19 DIAGNOSIS — I340 Nonrheumatic mitral (valve) insufficiency: Secondary | ICD-10-CM | POA: Insufficient documentation

## 2015-04-19 DIAGNOSIS — I10 Essential (primary) hypertension: Secondary | ICD-10-CM | POA: Diagnosis not present

## 2015-04-19 HISTORY — DX: Hyperlipidemia, unspecified: E78.5

## 2015-04-19 HISTORY — PX: TEE WITHOUT CARDIOVERSION: SHX5443

## 2015-04-19 HISTORY — DX: Essential (primary) hypertension: I10

## 2015-04-19 LAB — GLUCOSE, CAPILLARY: GLUCOSE-CAPILLARY: 149 mg/dL — AB (ref 65–99)

## 2015-04-19 SURGERY — ECHOCARDIOGRAM, TRANSESOPHAGEAL
Anesthesia: Moderate Sedation

## 2015-04-19 MED ORDER — HYDRALAZINE HCL 20 MG/ML IJ SOLN
INTRAMUSCULAR | Status: DC | PRN
  Administered 2015-04-19: 10 mg via INTRAVENOUS

## 2015-04-19 MED ORDER — MIDAZOLAM HCL 5 MG/ML IJ SOLN
INTRAMUSCULAR | Status: AC
Start: 2015-04-19 — End: 2015-04-19
  Filled 2015-04-19: qty 2

## 2015-04-19 MED ORDER — LIDOCAINE VISCOUS 2 % MT SOLN
OROMUCOSAL | Status: AC
  Filled 2015-04-19: qty 15

## 2015-04-19 MED ORDER — BUTAMBEN-TETRACAINE-BENZOCAINE 2-2-14 % EX AERO
INHALATION_SPRAY | CUTANEOUS | Status: DC | PRN
  Administered 2015-04-19: 2 via TOPICAL

## 2015-04-19 MED ORDER — FENTANYL CITRATE (PF) 100 MCG/2ML IJ SOLN
INTRAMUSCULAR | Status: AC
Start: 2015-04-19 — End: 2015-04-19
  Filled 2015-04-19: qty 2

## 2015-04-19 MED ORDER — MIDAZOLAM HCL 10 MG/2ML IJ SOLN
INTRAMUSCULAR | Status: DC | PRN
Start: 2015-04-19 — End: 2015-04-19
  Administered 2015-04-19: 2 mg via INTRAVENOUS
  Administered 2015-04-19: 1 mg via INTRAVENOUS
  Administered 2015-04-19: 2 mg via INTRAVENOUS

## 2015-04-19 MED ORDER — HYDRALAZINE HCL 20 MG/ML IJ SOLN
INTRAMUSCULAR | Status: AC
Start: 2015-04-19 — End: 2015-04-19
  Filled 2015-04-19: qty 1

## 2015-04-19 MED ORDER — FENTANYL CITRATE (PF) 100 MCG/2ML IJ SOLN
INTRAMUSCULAR | Status: DC | PRN
  Administered 2015-04-19 (×3): 25 ug via INTRAVENOUS

## 2015-04-19 NOTE — Interval H&P Note (Signed)
History and Physical Interval Note:  04/19/2015 12:48 PM  Kelly Romero  has presented today for surgery, with the diagnosis of TIA  The various methods of treatment have been discussed with the patient and family. After consideration of risks, benefits and other options for treatment, the patient has consented to  Procedure(s): TRANSESOPHAGEAL ECHOCARDIOGRAM (TEE) (N/A) as a surgical intervention .  [name] history has been reviewed, patient examined, no change in status, stable for surgery.  I have reviewed the patient's chart and labs.  Questions were answered to the patient's satisfaction.     SKAINS, MARK

## 2015-04-19 NOTE — H&P (View-Only) (Signed)
 NEUROLOGY FOLLOW UP OFFICE NOTE  Kelly Romero [id number]  HISTORY OF PRESENT ILLNESS: Kelly Romero is a 50 year old right-handed female with history of uncontrolled diabetes, hypertension and depression who follows up for headache and stroke.  History obtained by patient and hospital notes from [facility].  MRI report and labs reviewed.  UPDATE: She had a similar stroke-like episode on 02/22/15.  She woke up with headache and later developed slurred speech.  She was admitted to [facility], where MRI of brain revealed 2 mm left occipital subacute infarct.  2D echo was performed, which appeared unremarkable, but didn't make mention of a PFO.  Carotid doppler again showed no hemodynamically significant ICA stenosis.  Hypercoagulable panel, ANA, lupus panel, antiextractable nuclear antigen were negative.  LDL was 130.  I am uncertain how elevated was her blood pressure.  Plavix was added to her ASA.   At some point, propranolol was discontinued and she was started on amitriptyline 25mg at bedtime.  Headaches are improved.  They occur once every 2 weeks and respond to ibuprofen 800mg, sometimes requiring hydrocodone.  HISTORY: Onset:  July.  Sudden onset. Location:  Right sided, from front to back of head.  Initially had pain behind right ear. Quality:  squeezing Initial intensity:  7-8/10 Aura:  no Prodrome:  no Associated symptoms:  Some photophobia and phonophobia.  Sometimes nausea.  She denies visual disturbance. Initial Duration:  All day Initial Frequency:  Daily.  Sometimes wakes her up at night Triggers/exacerbating factors:  Loud noise Relieving factors:  Laying in a cool dark quiet room Activity:  6 days out of last 3 weeks, unable to function  Past abortive medication:  Tylenol, Excedrin, Percocet Past preventative medication:  none Other past therapy:  none  Current abortive medication:  Ibuprofen 800mg Antihypertensive medications:  Lisinopril,  metoprolol Antidepressant medications:  lexapro 10mg Anticonvulsant medications:  none Vitamins/Herbal/Supplements:  none Other therapy:  none  Sed Rate was 30.  CBC and CMP were unremarkable except for elevated glucose of 201.  MRI of the brain from 11/09/14 showed chronic small vessel ischemic changes with an incidental tiny subacute infarct in the right frontal lobe.    Encephalopathy/Stroke: She was in [facility] on 12/07/14 for stroke symptoms.  She developed slurred speech and right arm numbness.   She was agitated, yelling and screaming in the ED.  She was given Ativan and Haldol.  CT of head was unremarkable.  Repeat CT of head two days later was negative.  CXR negative.  Urine tox screen was positive for opioids.  She was found to have severally elevated blood pressure with systolic at 240 and higher.  She was treated with IV Labetolol and Vasotec.  Hgb A1c was 12 with serum glucose level of 400.  She did not have fever or leukocytosis, and UA was borderline with trace leukocytes but negative nitrite.  She was treated with 5 days of IV Rocephin.  Troponins were negative.  Etiology was unknown but TIA was suspected as a possibility.  PAST MEDICAL HISTORY: Past Medical History  Diagnosis Date  . Diabetes mellitus without complication (HCC)   . Headache     MEDICATIONS: Current Outpatient Prescriptions on File Prior to Visit  Medication Sig Dispense Refill  . aspirin EC 81 MG tablet Take 81 mg by mouth.    . escitalopram (LEXAPRO) 10 MG tablet Take 10 mg by mouth.    . Insulin Degludec 100 UNIT/ML SOPN Inject into the skin.    .   lisinopril (PRINIVIL,ZESTRIL) 2.5 MG tablet Take 2.5 mg by mouth.    . rosuvastatin (CRESTOR) 10 MG tablet Take 10 mg by mouth.     No current facility-administered medications on file prior to visit.    ALLERGIES: Allergies  Allergen Reactions  . Erythromycin Itching    FAMILY HISTORY: Family History  Problem Relation Age of Onset  . COPD  Mother   . Stroke Father   . Heart failure Brother   . Cancer Maternal Grandmother     unknown   . COPD    . Heart failure Maternal Grandfather   . Hypertension Maternal Grandfather   . Cancer Paternal Grandfather     lung  . Diabetes Paternal Grandmother     SOCIAL HISTORY: Social History   Social History  . Marital Status: Married    Spouse Name: N/A  . Number of Children: N/A  . Years of Education: N/A   Occupational History  . Not on file.   Social History Main Topics  . Smoking status: Never Smoker   . Smokeless tobacco: Never Used  . Alcohol Use: 0.0 oz/week    0 Standard drinks or equivalent per week     Comment: rare   . Drug Use: No  . Sexual Activity: No   Other Topics Concern  . Not on file   Social History Narrative    REVIEW OF SYSTEMS: Constitutional: No fevers, chills, or sweats, no generalized fatigue, change in appetite Eyes: No visual changes, double vision, eye pain Ear, nose and throat: No hearing loss, ear pain, nasal congestion, sore throat Cardiovascular: No chest pain, palpitations Respiratory:  No shortness of breath at rest or with exertion, wheezes GastrointestinaI: No nausea, vomiting, diarrhea, abdominal pain, fecal incontinence Genitourinary:  No dysuria, urinary retention or frequency Musculoskeletal:  No neck pain, back pain Integumentary: No rash, pruritus, skin lesions Neurological: as above Psychiatric: No depression, insomnia, anxiety Endocrine: No palpitations, fatigue, diaphoresis, mood swings, change in appetite, change in weight, increased thirst Hematologic/Lymphatic:  No anemia, purpura, petechiae. Allergic/Immunologic: no itchy/runny eyes, nasal congestion, recent allergic reactions, rashes  PHYSICAL EXAM: Filed Vitals:   04/15/15 1339  BP: 138/86  Pulse: 98   General: No acute distress.  Patient appears well-groomed.   Head:  Normocephalic/atraumatic Eyes:  Fundoscopic exam unremarkable without vessel changes,  exudates, hemorrhages or papilledema. Neck: supple, no paraspinal tenderness, full range of motion Heart:  Regular rate and rhythm Lungs:  Clear to auscultation bilaterally Back: No paraspinal tenderness Neurological Exam: alert and oriented to person, place, and time. Attention span and concentration intact, recent and remote memory intact, fund of knowledge intact.  Speech fluent and not dysarthric, language intact.  CN II-XII intact. Fundoscopic exam unremarkable without vessel changes, exudates, hemorrhages or papilledema.  Bulk and tone normal, muscle strength 5/5 throughout.  Sensation to light touch, temperature and vibration intact.  Deep tendon reflexes 2+ throughout, toes downgoing.  Finger to nose with postural and kinetic tremor, heel to shin testing intact.  Gait normal, Romberg negative.  IMPRESSION: CVA.  There is no mention of PFO on repeat TTE at [facility] Hypertension Uncontrolled type 2 diabetes mellitus Hyperlipidemia Probable tension type headache  PLAN: 1.  Continue ASA 81mg daily and Plavix 75mg daily.  In one month, we can discontinue ASA and just continue Plavix 2.  Continue Crestor.  Check fasting lipid panel in one month and adjust dose if needed (LDL goal less than 70) 3.  Will get TEE.  If a PFO   is confirmed, may consider referral to cardiology for PFO closure, since this is the second stroke and both occurred in different vascular territories 4.  Continue amitriptyline 25mg daily for now 5.  Ibuprofen 800mg for abortive therapy, try to avoid hydrocodone 6.  Monitor tremor for now. 7.  Optimize glycemic control and continue BP control 8.  Follow up in 6 months  Adam Jaffe, DO  CC: John [name], MD        

## 2015-04-19 NOTE — CV Procedure (Signed)
   Transesophageal echocardiogram   Indications: Stroke , recurrent   50 year old female with hypertension, recurrent stroke.  During this procedure the patient is administered a total of Versed 5 mg and Fentanyl 75 mg to achieve and maintain moderate conscious sedation.  [name] heart rate, blood pressure, and oxygen saturation are monitored continuously during the procedure. The period of conscious sedation is 7 minutes, of which I was present face-to-face 100% of this time.   Findings:   No embolic source identified  No evidence of PFO with bubble study , with and without Valsalva.  Trace mitral regurgitation  Normal ejection fraction   Impressions:   Reassuring transesophageal echocardiogram.  Continue to treat blood pressure and other risk factors.

## 2015-04-19 NOTE — Discharge Instructions (Signed)
Transesophageal Echocardiogram °Transesophageal echocardiography (TEE) is a picture test of your heart using sound waves. The pictures taken can give very detailed pictures of your heart. This can help your doctor see if there are problems with your heart. TEE can check: °· If your heart has blood clots in it. °· How well your heart valves are working. °· If you have an infection on the inside of your heart. °· Some of the major arteries of your heart. °· If your heart valve is working after a repair. °· Your heart before a procedure that uses a shock to your heart to get the rhythm back to normal. ° °AFTER THE PROCEDURE °· You will be taken to a recovery area so the sedative can wear off. °· Your throat may be sore and scratchy. This will go away slowly over time. °· You will go home when you are fully awake and able to swallow liquids. °· You should have someone stay with you for the next 24 hours. °· Do not drive or operate machinery for the next 24 hours. °  °This information is not intended to replace advice given to you by your health care provider. Make sure you discuss any questions you have with your health care provider. °  °Document Released: 12/28/2008 Document Revised: 03/07/2013 Document Reviewed: 09/01/2012 °Elsevier Interactive Patient Education ©2016 [employer]vier Inc[employer] ° °

## 2015-04-19 NOTE — Progress Notes (Signed)
Echocardiogram 2D Echocardiogram has been performed.  Dorothey Baseman 04/19/2015, 1:24 PM

## 2015-04-22 ENCOUNTER — Encounter (HOSPITAL_COMMUNITY): Payer: Self-pay | Admitting: Cardiology

## 2015-05-02 ENCOUNTER — Other Ambulatory Visit: Payer: Self-pay

## 2015-05-02 DIAGNOSIS — Z1231 Encounter for screening mammogram for malignant neoplasm of breast: Secondary | ICD-10-CM

## 2015-05-13 ENCOUNTER — Telehealth: Payer: Self-pay

## 2015-05-13 NOTE — Telephone Encounter (Signed)
-----   Message from Richarda Overlie, [place] sent at 04/15/2015  2:31 PM EST ----- Needs fasting lipid checked. Order placed. Stop aspirin 81 mg. Continue plavix

## 2015-05-13 NOTE — Telephone Encounter (Signed)
Spoke w/ patient. Just had lipid done at PCP. Dr. Gwendlyn Deutscher, [place]. Called office, left vm on medical records line.

## 2015-05-13 NOTE — Telephone Encounter (Signed)
Lipid results were faxed. Placed in provider's box for review.

## 2015-05-17 ENCOUNTER — Ambulatory Visit: Payer: 59

## 2015-06-04 ENCOUNTER — Ambulatory Visit
Admission: RE | Admit: 2015-06-04 | Discharge: 2015-06-04 | Disposition: A | Discharge status: Home / Self Care | Payer: 59 | Source: Ambulatory Visit

## 2015-06-04 DIAGNOSIS — Z1231 Encounter for screening mammogram for malignant neoplasm of breast: Secondary | ICD-10-CM

## 2015-08-15 ENCOUNTER — Emergency Department (HOSPITAL_COMMUNITY): Payer: 59

## 2015-08-15 ENCOUNTER — Observation Stay (HOSPITAL_COMMUNITY): Payer: 59

## 2015-08-15 ENCOUNTER — Observation Stay (HOSPITAL_COMMUNITY)
Admission: EM | Admit: 2015-08-15 | Discharge: 2015-08-19 | Disposition: A | Discharge status: Home Health | Payer: 59 | Attending: Oncology | Admitting: Oncology

## 2015-08-15 ENCOUNTER — Encounter (HOSPITAL_COMMUNITY): Payer: Self-pay | Admitting: Neurology

## 2015-08-15 DIAGNOSIS — R519 Headache, unspecified: Secondary | ICD-10-CM | POA: Diagnosis present

## 2015-08-15 DIAGNOSIS — G43909 Migraine, unspecified, not intractable, without status migrainosus: Secondary | ICD-10-CM | POA: Insufficient documentation

## 2015-08-15 DIAGNOSIS — N390 Urinary tract infection, site not specified: Secondary | ICD-10-CM | POA: Diagnosis not present

## 2015-08-15 DIAGNOSIS — I10 Essential (primary) hypertension: Secondary | ICD-10-CM | POA: Insufficient documentation

## 2015-08-15 DIAGNOSIS — R4781 Slurred speech: Secondary | ICD-10-CM | POA: Insufficient documentation

## 2015-08-15 DIAGNOSIS — Z794 Long term (current) use of insulin: Secondary | ICD-10-CM | POA: Diagnosis not present

## 2015-08-15 DIAGNOSIS — E1142 Type 2 diabetes mellitus with diabetic polyneuropathy: Secondary | ICD-10-CM | POA: Diagnosis not present

## 2015-08-15 DIAGNOSIS — R51 Headache: Secondary | ICD-10-CM

## 2015-08-15 DIAGNOSIS — Z8249 Family history of ischemic heart disease and other diseases of the circulatory system: Secondary | ICD-10-CM | POA: Insufficient documentation

## 2015-08-15 DIAGNOSIS — I639 Cerebral infarction, unspecified: Secondary | ICD-10-CM | POA: Diagnosis present

## 2015-08-15 DIAGNOSIS — G9340 Encephalopathy, unspecified: Secondary | ICD-10-CM | POA: Diagnosis not present

## 2015-08-15 DIAGNOSIS — IMO0002 Reserved for concepts with insufficient information to code with codable children: Secondary | ICD-10-CM | POA: Diagnosis present

## 2015-08-15 DIAGNOSIS — N39498 Other specified urinary incontinence: Secondary | ICD-10-CM | POA: Diagnosis not present

## 2015-08-15 DIAGNOSIS — R410 Disorientation, unspecified: Secondary | ICD-10-CM | POA: Diagnosis not present

## 2015-08-15 DIAGNOSIS — Z7982 Long term (current) use of aspirin: Secondary | ICD-10-CM | POA: Insufficient documentation

## 2015-08-15 DIAGNOSIS — R29818 Other symptoms and signs involving the nervous system: Secondary | ICD-10-CM | POA: Diagnosis not present

## 2015-08-15 DIAGNOSIS — E669 Obesity, unspecified: Secondary | ICD-10-CM | POA: Insufficient documentation

## 2015-08-15 DIAGNOSIS — Z6838 Body mass index (BMI) 38.0-38.9, adult: Secondary | ICD-10-CM | POA: Insufficient documentation

## 2015-08-15 DIAGNOSIS — Z8673 Personal history of transient ischemic attack (TIA), and cerebral infarction without residual deficits: Secondary | ICD-10-CM | POA: Insufficient documentation

## 2015-08-15 DIAGNOSIS — E785 Hyperlipidemia, unspecified: Secondary | ICD-10-CM | POA: Insufficient documentation

## 2015-08-15 DIAGNOSIS — I160 Hypertensive urgency: Secondary | ICD-10-CM | POA: Insufficient documentation

## 2015-08-15 DIAGNOSIS — F419 Anxiety disorder, unspecified: Secondary | ICD-10-CM | POA: Insufficient documentation

## 2015-08-15 DIAGNOSIS — R2990 Unspecified symptoms and signs involving the nervous system: Secondary | ICD-10-CM | POA: Insufficient documentation

## 2015-08-15 DIAGNOSIS — R4182 Altered mental status, unspecified: Secondary | ICD-10-CM | POA: Insufficient documentation

## 2015-08-15 DIAGNOSIS — R200 Anesthesia of skin: Secondary | ICD-10-CM | POA: Diagnosis not present

## 2015-08-15 DIAGNOSIS — F418 Other specified anxiety disorders: Secondary | ICD-10-CM | POA: Diagnosis not present

## 2015-08-15 DIAGNOSIS — R531 Weakness: Secondary | ICD-10-CM | POA: Insufficient documentation

## 2015-08-15 DIAGNOSIS — F329 Major depressive disorder, single episode, unspecified: Secondary | ICD-10-CM | POA: Insufficient documentation

## 2015-08-15 DIAGNOSIS — Z7902 Long term (current) use of antithrombotics/antiplatelets: Secondary | ICD-10-CM | POA: Diagnosis not present

## 2015-08-15 DIAGNOSIS — R8271 Bacteriuria: Secondary | ICD-10-CM | POA: Diagnosis not present

## 2015-08-15 DIAGNOSIS — R32 Unspecified urinary incontinence: Secondary | ICD-10-CM | POA: Insufficient documentation

## 2015-08-15 DIAGNOSIS — Z79899 Other long term (current) drug therapy: Secondary | ICD-10-CM | POA: Insufficient documentation

## 2015-08-15 DIAGNOSIS — E1165 Type 2 diabetes mellitus with hyperglycemia: Secondary | ICD-10-CM | POA: Diagnosis not present

## 2015-08-15 DIAGNOSIS — F32A Depression, unspecified: Secondary | ICD-10-CM | POA: Diagnosis present

## 2015-08-15 DIAGNOSIS — I619 Nontraumatic intracerebral hemorrhage, unspecified: Secondary | ICD-10-CM | POA: Diagnosis present

## 2015-08-15 LAB — COMPREHENSIVE METABOLIC PANEL
ALK PHOS: 91 U/L (ref 38–126)
ALT: 15 U/L (ref 14–54)
ANION GAP: 6 (ref 5–15)
AST: 17 U/L (ref 15–41)
Albumin: 3.1 g/dL — ABNORMAL LOW (ref 3.5–5.0)
BILIRUBIN TOTAL: 0.7 mg/dL (ref 0.3–1.2)
BUN: 19 mg/dL (ref 6–20)
CALCIUM: 9.4 mg/dL (ref 8.9–10.3)
CO2: 28 mmol/L (ref 22–32)
Chloride: 98 mmol/L — ABNORMAL LOW (ref 101–111)
Creatinine, Ser: 0.86 mg/dL (ref 0.44–1.00)
Glucose, Bld: 370 mg/dL — ABNORMAL HIGH (ref 65–99)
POTASSIUM: 4 mmol/L (ref 3.5–5.1)
Sodium: 132 mmol/L — ABNORMAL LOW (ref 135–145)
TOTAL PROTEIN: 6.7 g/dL (ref 6.5–8.1)

## 2015-08-15 LAB — RAPID URINE DRUG SCREEN, HOSP PERFORMED
Amphetamines: NOT DETECTED
Barbiturates: NOT DETECTED
Benzodiazepines: NOT DETECTED
COCAINE: NOT DETECTED
OPIATES: NOT DETECTED
Tetrahydrocannabinol: NOT DETECTED

## 2015-08-15 LAB — I-STAT TROPONIN, ED: TROPONIN I, POC: 0.01 ng/mL (ref 0.00–0.08)

## 2015-08-15 LAB — URINALYSIS, ROUTINE W REFLEX MICROSCOPIC
BILIRUBIN URINE: NEGATIVE
KETONES UR: NEGATIVE mg/dL
LEUKOCYTES UA: NEGATIVE
NITRITE: POSITIVE — AB
PH: 7 (ref 5.0–8.0)
PROTEIN: 100 mg/dL — AB
Specific Gravity, Urine: 1.017 (ref 1.005–1.030)

## 2015-08-15 LAB — APTT: aPTT: 28 seconds (ref 24–37)

## 2015-08-15 LAB — DIFFERENTIAL
Basophils Absolute: 0 10*3/uL (ref 0.0–0.1)
Basophils Relative: 0 %
EOS ABS: 0.1 10*3/uL (ref 0.0–0.7)
EOS PCT: 2 %
LYMPHS ABS: 1.6 10*3/uL (ref 0.7–4.0)
Lymphocytes Relative: 27 %
MONO ABS: 0.3 10*3/uL (ref 0.1–1.0)
MONOS PCT: 6 %
NEUTROS PCT: 65 %
Neutro Abs: 4 10*3/uL (ref 1.7–7.7)

## 2015-08-15 LAB — URINE MICROSCOPIC-ADD ON

## 2015-08-15 LAB — I-STAT CHEM 8, ED
BUN: 23 mg/dL — ABNORMAL HIGH (ref 6–20)
CALCIUM ION: 1.19 mmol/L (ref 1.12–1.23)
CREATININE: 0.8 mg/dL (ref 0.44–1.00)
Chloride: 95 mmol/L — ABNORMAL LOW (ref 101–111)
Glucose, Bld: 369 mg/dL — ABNORMAL HIGH (ref 65–99)
HEMATOCRIT: 38 % (ref 36.0–46.0)
HEMOGLOBIN: 12.9 g/dL (ref 12.0–15.0)
Potassium: 4.1 mmol/L (ref 3.5–5.1)
Sodium: 135 mmol/L (ref 135–145)
TCO2: 31 mmol/L (ref 0–100)

## 2015-08-15 LAB — ETHANOL: Alcohol, Ethyl (B): <5 mg/dL (ref <5)

## 2015-08-15 LAB — CBC
HEMATOCRIT: 38.2 % (ref 36.0–46.0)
HEMOGLOBIN: 12.4 g/dL (ref 12.0–15.0)
MCH: 27.1 pg (ref 26.0–34.0)
MCHC: 32.5 g/dL (ref 30.0–36.0)
MCV: 83.6 fL (ref 78.0–100.0)
Platelets: 221 10*3/uL (ref 150–400)
RBC: 4.57 MIL/uL (ref 3.87–5.11)
RDW: 12.9 % (ref 11.5–15.5)
WBC: 6 10*3/uL (ref 4.0–10.5)

## 2015-08-15 LAB — PROTIME-INR
INR: 1.07 (ref 0.00–1.49)
Prothrombin Time: 14.1 seconds (ref 11.6–15.2)

## 2015-08-15 LAB — GLUCOSE, CAPILLARY: Glucose-Capillary: 225 mg/dL — ABNORMAL HIGH (ref 65–99)

## 2015-08-15 LAB — CBG MONITORING, ED: Glucose-Capillary: 160 mg/dL — ABNORMAL HIGH (ref 65–99)

## 2015-08-15 MED ORDER — ESCITALOPRAM OXALATE 10 MG PO TABS
10.0000 mg | ORAL_TABLET | Freq: Every day | ORAL | Status: DC
  Administered 2015-08-15 – 2015-08-19 (×4): 10 mg via ORAL
  Filled 2015-08-15 (×4): qty 1

## 2015-08-15 MED ORDER — LORAZEPAM 2 MG/ML IJ SOLN
0.5000 mg | Freq: Once | INTRAMUSCULAR | Status: AC
  Administered 2015-08-15: 0.5 mg via INTRAVENOUS
  Filled 2015-08-15: qty 1

## 2015-08-15 MED ORDER — KETOROLAC TROMETHAMINE 15 MG/ML IJ SOLN
15.0000 mg | Freq: Once | INTRAMUSCULAR | Status: AC
  Administered 2015-08-15: 15 mg via INTRAVENOUS
  Filled 2015-08-15: qty 1

## 2015-08-15 MED ORDER — INSULIN ASPART 100 UNIT/ML ~~LOC~~ SOLN
0.0000 [IU] | Freq: Every day | SUBCUTANEOUS | Status: DC
  Administered 2015-08-15 – 2015-08-16 (×2): 2 [IU] via SUBCUTANEOUS
  Administered 2015-08-18: 3 [IU] via SUBCUTANEOUS

## 2015-08-15 MED ORDER — INSULIN DETEMIR 100 UNIT/ML ~~LOC~~ SOLN
25.0000 [IU] | Freq: Every day | SUBCUTANEOUS | Status: DC
  Administered 2015-08-15 – 2015-08-16 (×2): 25 [IU] via SUBCUTANEOUS
  Filled 2015-08-15 (×2): qty 0.25

## 2015-08-15 MED ORDER — PANTOPRAZOLE SODIUM 40 MG IV SOLR
40.0000 mg | Freq: Once | INTRAVENOUS | Status: AC
  Administered 2015-08-15: 40 mg via INTRAVENOUS
  Filled 2015-08-15: qty 40

## 2015-08-15 MED ORDER — VALPROATE SODIUM 500 MG/5ML IV SOLN
500.0000 mg | Freq: Once | INTRAVENOUS | Status: AC
  Administered 2015-08-15: 500 mg via INTRAVENOUS
  Filled 2015-08-15: qty 5

## 2015-08-15 MED ORDER — CLOPIDOGREL BISULFATE 75 MG PO TABS
75.0000 mg | ORAL_TABLET | Freq: Every day | ORAL | Status: DC
  Administered 2015-08-17 – 2015-08-19 (×3): 75 mg via ORAL
  Filled 2015-08-15 (×3): qty 1

## 2015-08-15 MED ORDER — GADOBENATE DIMEGLUMINE 529 MG/ML IV SOLN
20.0000 mL | Freq: Once | INTRAVENOUS | Status: AC | PRN
  Administered 2015-08-15: 20 mL via INTRAVENOUS

## 2015-08-15 MED ORDER — AMITRIPTYLINE HCL 50 MG PO TABS
25.0000 mg | ORAL_TABLET | Freq: Every evening | ORAL | Status: DC | PRN
  Filled 2015-08-15: qty 1

## 2015-08-15 MED ORDER — INSULIN ASPART 100 UNIT/ML ~~LOC~~ SOLN
0.0000 [IU] | Freq: Three times a day (TID) | SUBCUTANEOUS | Status: DC
  Administered 2015-08-16: 8 [IU] via SUBCUTANEOUS
  Administered 2015-08-16 – 2015-08-17 (×3): 5 [IU] via SUBCUTANEOUS
  Administered 2015-08-17: 8 [IU] via SUBCUTANEOUS
  Administered 2015-08-17: 5 [IU] via SUBCUTANEOUS
  Administered 2015-08-18 (×2): 3 [IU] via SUBCUTANEOUS
  Administered 2015-08-18: 5 [IU] via SUBCUTANEOUS

## 2015-08-15 MED ORDER — ZINC OXIDE 40 % EX OINT
TOPICAL_OINTMENT | Freq: Every morning | CUTANEOUS | Status: DC
  Administered 2015-08-16 – 2015-08-17 (×2): 1 via TOPICAL
  Administered 2015-08-18 – 2015-08-19 (×2): via TOPICAL
  Filled 2015-08-15: qty 114

## 2015-08-15 MED ORDER — BACLOFEN 10 MG PO TABS
10.0000 mg | ORAL_TABLET | Freq: Once | ORAL | Status: AC
  Administered 2015-08-15: 10 mg via ORAL
  Filled 2015-08-15: qty 1

## 2015-08-15 MED ORDER — ASPIRIN EC 81 MG PO TBEC
81.0000 mg | DELAYED_RELEASE_TABLET | Freq: Every day | ORAL | Status: DC
  Administered 2015-08-17: 81 mg via ORAL
  Filled 2015-08-15: qty 1

## 2015-08-15 MED ORDER — ROSUVASTATIN CALCIUM 20 MG PO TABS
20.0000 mg | ORAL_TABLET | Freq: Every day | ORAL | Status: DC
  Administered 2015-08-15 – 2015-08-18 (×3): 20 mg via ORAL
  Filled 2015-08-15 (×3): qty 1

## 2015-08-15 MED ORDER — LORAZEPAM 2 MG/ML IJ SOLN
1.0000 mg | INTRAMUSCULAR | Status: DC | PRN
  Administered 2015-08-15: 1 mg via INTRAVENOUS
  Filled 2015-08-15 (×3): qty 1

## 2015-08-15 MED ORDER — LABETALOL HCL 5 MG/ML IV SOLN
10.0000 mg | Freq: Once | INTRAVENOUS | Status: AC
  Administered 2015-08-15: 10 mg via INTRAVENOUS
  Filled 2015-08-15: qty 4

## 2015-08-15 MED ORDER — ENOXAPARIN SODIUM 40 MG/0.4ML ~~LOC~~ SOLN
40.0000 mg | SUBCUTANEOUS | Status: DC
  Administered 2015-08-16 – 2015-08-19 (×4): 40 mg via SUBCUTANEOUS
  Filled 2015-08-15 (×4): qty 0.4

## 2015-08-15 NOTE — ED Notes (Signed)
Per ems- pt reports woke up at 0300 with h/a, took some excedrin h/a went back to sleep, up around 6 am to get ready for work. Went to work and around [employer]0730 noticed left sided numbness. Slurred speech and feeling "forgetful". Has hx of TIA. EMS arrived endorsed sx but in route developed into AMS with improvement of left sided weakness. CBG 341. BP 240/140 initial BP. Recent BP 200/102. Pt is alert, speech sounds slurred, c/o h/a

## 2015-08-15 NOTE — H&P (Signed)
Date: 08/15/2015               Patient Name:  Kelly Romero MRN: [id number]  DOB: 05/05/1965 Age / Sex: 50 y.o., female   PCP: Noni SaupeJohn F. [name] II, MD              Medical Service: Internal Medicine Teaching Service              Attending Physician: Dr. Levert FeinsteinJames M Granfortuna, MD    First Contact: Ernest HaberNate Belicia Difatta, MS 3 Pager: [phone]  Second Contact: Dr. Darreld McleanVishal Patel Pager: [phone]  Third Contact Dr. Heywood Ilesushil Patel Pager: [phone]       After Hours (After 5p/  First Contact Pager: [phone]  weekends / holidays): Second Contact Pager: [phone]   Chief Complaint: Headahce, dizziness, and left arm weakness  History of Present Illness: Ms. Kelly Romero is a 50 year-old female with a history of hypertension, uncontrolled diabetes mellitus, and hyperlipidemia who presented with headache, dizziness, and left arm weakness.  Patient reports last night she developed a frontal headache that was partially alleviated with ibuprofen 800 mg.   This morning at 3 am she was awoken from sleep with a worsening frontal headache that did not respond to ibuprofen.  She has a history of these intermittent headaches but claims they usually respond to ibuprofen.  Patient went to work later in the morning when she developed left arm weakness, dizziness, and slurred speech around 0730.  At this time, she admitted to difficulty typing on her computer at work because of left arm weakness.  She denies lower extremity weakness.  She endorsed dizziness in a seated and standing position.  Patient also claims she had difficulty forming her words. She denies changes in vision, light headedness, syncope, numbness, and tingling.  At the time of the interview, patient states her weakness and dizziness have improved but continues to complain of a headache.    Of note, she has had 2 prior workups for stroke like symptoms over the past year.  First in September 2016, she presented to [facility] with slurred speech and right arm  numbness.  Her initial head CT was negative, as was a repeat head CT 2 days later.  She was found to have significantly elevated systolic BP over 295240 and a Hgb A1c of 12.  Etiology at the time was unknown but a TIA was suspected.  In 12/16, patient was admitted to [facility] with headache and slurred speech after an MRI of the brain revealed a 2 mm left occipital subacute infarct.  A 2D echo was performed which was unremarkable and didn't mention a PFO as previously reported.  Carotid dopplers were negative for significant ICA stenosis.   Patient's family reports she returned to her baseline level of health and function after her most recent hospitalization and has been doing well other than her intermittent headaches.      Meds: Current Facility-Administered Medications  Medication Dose Route Frequency Provider Last Rate Last Dose  . LORazepam (ATIVAN) injection 1 mg  1 mg Intravenous PRN Pricilla LovelessScott Goldston, MD       Current Outpatient Prescriptions  Medication Sig Dispense Refill  . amitriptyline (ELAVIL) 25 MG tablet Take 25 mg by mouth at bedtime.     Marland Kitchen. aspirin EC 81 MG tablet Take 81 mg by mouth daily.     Marland Kitchen. atorvastatin (LIPITOR) 80 MG tablet Take 80 mg by mouth 2 (two) times daily.     . clopidogrel (PLAVIX) 75  MG tablet Take 75 mg by mouth daily.    . Empagliflozin-Linagliptin (GLYXAMBI) 10-5 MG TABS Take 1 tablet by mouth daily.     Marland Kitchen escitalopram (LEXAPRO) 10 MG tablet Take 10 mg by mouth daily.     . insulin detemir (LEVEMIR) 100 UNIT/ML injection Inject 80 Units into the skin daily.    Marland Kitchen lisinopril (PRINIVIL,ZESTRIL) 30 MG tablet Take 30 mg by mouth daily.    . rosuvastatin (CRESTOR) 10 MG tablet Take 10 mg by mouth at bedtime.     Marland Kitchen lisinopril (PRINIVIL,ZESTRIL) 2.5 MG tablet Take 2.5 mg by mouth daily.       Allergies: Allergies as of 08/15/2015 - Review Complete 08/15/2015  Allergen Reaction Noted  . Erythromycin Itching 10/19/2014   Past Medical History  Diagnosis  Date  . Diabetes mellitus without complication (HCC)   . Headache   . Hypertension   . Hyperlipidemia    Past Surgical History  Procedure Laterality Date  . Cholecystectomy    . Hernia repair    . Appendectomy    . Foot ganglion excision    . Vaginal hysterectomy    . Tee without cardioversion N/A 04/19/2015    Procedure: TRANSESOPHAGEAL ECHOCARDIOGRAM (TEE);  Surgeon: Jake Bathe, MD;  Location: [facility] ENDOSCOPY;  Service: Cardiovascular;  Laterality: N/A;   Family History  Problem Relation Age of Onset  . COPD Mother   . Stroke Father     51s  . Heart failure Brother   . Cancer Maternal Grandmother     unknown   . COPD    . Heart failure Maternal Grandfather   . Hypertension Maternal Grandfather   . Cancer Paternal Grandfather     lung  . Diabetes Paternal Grandmother    Social History   Social History  . Marital Status: Married    Spouse Name: N/A  . Number of Children: N/A  . Years of Education: N/A   Occupational History  . Not on file.   Social History Main Topics  . Smoking status: Never Smoker   . Smokeless tobacco: Never Used  . Alcohol Use: No     Comment: rare   . Drug Use: No  . Sexual Activity: No   Other Topics Concern  . Not on file   Social History Narrative    Review of Systems: Pertinent items noted in HPI and remainder of comprehensive ROS otherwise negative.   Physical Exam: Blood pressure 147/80, pulse 86, resp. rate 20, SpO2 99 %. General:  Alert and oriented x3.  Lying in bed in NAD.   HEENT: Normocephalic, MMM Cardiovascular: RRR without murmurs.  Normal S1 and S2.  No carotid bruits Lungs:  Normal work of breathing.  CTA bilaterally without wheezes or crackles Abdomen: Soft, nontender, nondistended Extremities: No peripheral edema Neuro: 5/5 weakness throughout the bilateral upper and lower extremities.  Cranial nerves II-XII intact.  Sensation intact to light touch bilaterally.  Deep tendon reflexes 1+ bilaterally in the LE.   Normal finger to nose test.  Difficulty forming words.  Lab Results:  Recent Labs Lab 08/15/15 0820 08/15/15 0841  WBC 6.0  --   HGB 12.4 12.9  HCT 38.2 38.0  PLT 221  --     Recent Labs Lab 08/15/15 0820 08/15/15 0841  NA 132* 135  K 4.0 4.1  CL 98* 95*  CO2 28  --   BUN 19 23*  CREATININE 0.86 0.80  CALCIUM 9.4  --   PROT 6.7  --  BILITOT 0.7  --   ALKPHOS 91  --   ALT 15  --   AST 17  --   GLUCOSE 370* 369*    Imaging results:  Ct Head Wo Contrast  08/15/2015  CLINICAL DATA:  Patient with headache, dizziness and stumbling. Slurred speech. Right-sided weakness. EXAM: CT HEAD WITHOUT CONTRAST TECHNIQUE: Contiguous axial images were obtained from the base of the skull through the vertex without intravenous contrast. COMPARISON:  Brain CT 05/13/2015. FINDINGS: Ventricles and sulci are appropriate for patient's age. Periventricular and subcortical white matter hypodensity compatible with chronic microvascular ischemic changes. 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 No evidence for acute cortically based infarct, intracranial hemorrhage, mass lesion or mass-effect. Calvarium is intact. Paranasal sinuses are well aerated. Mastoid air cells unremarkable. IMPRESSION: No acute intracranial process. Chronic microvascular ischemic changes. Critical Value/emergent results were called by telephone at the time of interpretation on 08/15/2015 at 8:54 am to Dr. Lavon Paganini, who verbally acknowledged these results. Electronically Signed   By: Annia Belt M.D.   On: 08/15/2015 08:56   Mr Brain Ltd W/o Cm  08/15/2015  CLINICAL DATA:  50 year old female with acute onset slurred speech and headache upon waking today. Limited diffusion only Brain MRI requested by Neurology. EXAM: LIMITED MRI HEAD WITHOUT CONTRAST TECHNIQUE: Multiplanar, multiecho pulse sequences of the brain and surrounding structures were obtained without  intravenous contrast. COMPARISON:  Head CT without contrast 0827 hours today. [facility] Brain MRI 05/13/2015. [facility] brain MRI 02/22/2015. FINDINGS: Axial in coronal diffusion weighted imaging only as requested by Neurology. Small 8-9 mm focus of restricted diffusion involving the right corona radiata and some subcortical white matter in the posterior right superior frontal lobe (series 3, image 36 and series 4, image 14). This appears to be separate from a small linear area of chronic T2 and FLAIR white matter signal abnormality here seen on the prior studies. No evidence of associated mass effect. No contralateral or other restricted diffusion. No ventriculomegaly. Stable cerebral volume. Facilitated diffusion in the Areas of white matter adjacent to the right frontal horn and caudate nucleus as well as in the bilateral periatrial white matter, which appears to correspond to chronic small vessel ischemia. IMPRESSION: 1. 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 2. Evidence of underlying chronic small vessel disease as seen on the 05/13/2015 comparison. Electronically Signed   By: Odessa Fleming M.D.   On: 08/15/2015 10:38    Other results: EKG: normal EKG, normal sinus rhythm  Assessment & Plan by Problem: Active Problems   Left arm weakness and dizziness   Hypertensive urgency   Headache   Diabetes Mellitus   Hyperlipidemia  Ms. Kushnir is a 50 year-old female with a history of hypertension, uncontrolled diabetes mellitus, hyperlipidemia, and two previous stroke like episodes who presented with hypertensive urgency, headache, dizziness, and left arm weakness.   # Left arm weakness and dizziness: Likely secondary to ischemic CVA vs TIA.  MRI on arrival significant for a small acute white matter infarct in the posterior right MCA distribution and evidence of underlying chronic small vessel disease as seen on previous MRI.  There is some question  from neurology about whether this white matter infarct is new or chronic when compared to previous MRI in Feb 2017.  Other items on the differential include hypoglycemia, syncope, and seizure.  However, given her elevated blood glucose to 370 on arrival this  is less likely.  Patient never lost consciousness and there were no convulsions or change in mental status that would suggest seizure or stroke.  Her weakness, slurred speech, and dizziness improved while in the ED, though she was still having some trouble forming words.  - Consult neurology, recs appreciated - Admit to floor - Brain MRA per neuro - Continue home Asp 81 mg daily and Plavix 75 mg daily - Continue home atorvastatin 80 mg BID - Order Echocardiogram and Carotid dopplers - Risk factor stratification: Hgb A1c, lipid panel - PT, OT, and Speech consult  # Hypertensive urgency: Systolic BPs up to 227.  Given labetalol 10 mg IV in the ED and pressure has been in the 140-160s systolic since then.   -Hold home lisinopril for permissive hypertension  # Headache: Likely 2/2 hypertensive urgency.  History of similar intermittent headaches.  Takes amitryptiline 25 mg qhs and ibuprofen 800 mg at home for this.  Given toradol 15 mg injection and baclofen 10 mg PO in the ED without resolution.   - Ibuprofen as needed  # Diabetes Mellitus: Blood glucose was 370 on arrival to the ED.  Hgb A1c pending - f/u Hgb A1c. - Changed detemir to 25 units basal dosing with moderate SSI.   # Hyperlipidemia: Takes atorvastatin 80 mg BID and rosuvastatin 10 mg qhs at home  - f/u lipid panel - Atorvastatin as above   Diet: normal diet  Prophylaxis: Lovenox  Code: Full  This is a Psychologist, occupational Note.  The care of the patient was discussed with Dr. Allena Katz and the assessment and plan was formulated with their assistance.  Please see their note for official documentation of the patient encounter.   Signed: Jolinda Croak, Med Student 08/15/2015,  5:29 PM

## 2015-08-15 NOTE — ED Notes (Signed)
CODE STROKE ACTIVATED @ 08:23

## 2015-08-15 NOTE — Code Documentation (Signed)
49yo [facility] via [facility] at [phone].  Patient reports waking up at 0300 with headache, dizziness and unsteady gait.  She took Excedrin and went back to bed.  She later woke up at 0600 and got ready and went to work. While at work patient developed right sided weakness and numbness as well as felt "forgetful" and EMS was called.  Code stroke called on patient arrival.  Patient hypertensive on arrival.  Patient to CT.  Stroke team to the bedside.  NIHSS 4, see documentation for details and code stroke times.  Patient with right arm and bilateral leg drift as well as subjective right sided decreased sensation on exam.  Patient's speech is slow, but no evidence of dysarthria or aphasia.  Patient reports going to bed last night at 2000, LKW 2000.  Dr. Lavon PaganiniNandigam to the bedside.  Patient is outside the window for treatment with tPA.  No acute stroke treatment at this time.  Bedside handoff with ED RN Maralyn SagoSarah.

## 2015-08-15 NOTE — ED Notes (Signed)
Checked patient blood sugar it was 160 notified RN Sarah of blood sugar

## 2015-08-15 NOTE — ED Notes (Signed)
Pt told she will need another MRI, pt is agreeable. MRI made aware

## 2015-08-15 NOTE — ED Notes (Signed)
Pt going to MRI, MD made aware pt BP. No orders at this time.

## 2015-08-15 NOTE — H&P (Signed)
Date: 08/15/2015               Patient Name:  Kelly Romero MRN: [id number]  DOB: 08/06/1965 Age / Sex: 50 y.o., female   PCP: Noni SaupeJohn F. [name] II, MD         Medical Service: Internal Medicine Teaching Service         Attending Physician: Dr. Levert FeinsteinJames M Granfortuna, MD    First Contact: Ernest HaberNate Koutlas, MS3 Pager: [phone]  Second Contact: Dr. Darreld McleanVishal Andry Bogden Pager: [phone]       After Hours (After 5p/  First Contact Pager: [phone]  weekends / holidays): Second Contact Pager: [phone]   Chief Complaint: Dizziness, headache, slurred speech, left arm weakness  History of Present Illness: Kelly Romero is a 50 year old female with PMH of HTN, T2DM, HLD, depression, and CVA who presents with  dizziness, headache, slurred speech, left arm weakness. Patient's family is present, who provide additional history. Her headache began last night, located at the front of her head, which found partial relief from Ibuprofen. She went to work and was noted to have new onset dizziness, left arm weakness, and slurred speech around 0730. She reported difficulty typing due to her weakness. She did not try to ambulate due to her dizziness, which persisted regardless of position. She reports difficulty finding words, unable to say what she intended. She reports occasional palpitations at night, which occurred last night as well. She had nausea without vomiting. She denies any lower extremity weakness, chest pain, SOB, loss of consciousness, or seizure-like activity. She reports one episode of urinary incontinence yesterday, but no loss of bowel.  Patient had 2 similar episodes worked up in the last year, which were more severe symptom-wise per family. In September 2016 she was seen [facility] for slurred speech and right arm weakness and suspected to have a TIA after workup. TTE at that time reported a PFO. In December 2016, she was seen at [facility] with headache and slurred speech and found to  have a left occipital subacute infarct on MRI of the brain. TTE at that time did not mention a PFO. She was seen by Dr. Everlena CooperJaffe of Neurology on follow up on 04/15/2015 and scheduled for TEE. This was completed on 04/19/2015 which showed an EF of 60-65%, but no PFO.  In the ED, she was noted to be hypertensive at 206/86. CT head was negative for acute changes. MRI brain was performed and showed a small acute white matter infarct in the posterior right MCA territory. Neurology was consulted in the ED. Per ED note, "Dr. Lavon PaganiniNandigam looks at images he thinks it's similar to Feb 2017 and thinks it's not a CVA. Recommends full MRI w/ and w/o contrast and admission to medicine for BP/DM control."   Social Hx: Never smoker, no alcohol, no illicit drug use. Works as an Data processing managerassistant teacher.  Family Hx: Father had CVA and died of MI at 2363, Mother with emphysema  Meds: Current Facility-Administered Medications  Medication Dose Route Frequency Provider Last Rate Last Dose  . amitriptyline (ELAVIL) tablet 25 mg  25 mg Oral QHS PRN Darreld McleanVishal Shamarie Call, MD      . aspirin EC tablet 81 mg  81 mg Oral Daily Darreld McleanVishal Karisma Meiser, MD      . clopidogrel (PLAVIX) tablet 75 mg  75 mg Oral Daily Darreld McleanVishal Jadelin Eng, MD      . escitalopram (LEXAPRO) tablet 10 mg  10 mg Oral Daily Darreld McleanVishal Delrae Hagey, MD      .  gadobenate dimeglumine (MULTIHANCE) injection 20 mL  20 mL Intravenous Once PRN Levert Feinstein, MD      . Melene Muller ON 08/16/2015] insulin aspart (novoLOG) injection 0-15 Units  0-15 Units Subcutaneous TID WC Darreld Mclean, MD      . insulin aspart (novoLOG) injection 0-5 Units  0-5 Units Subcutaneous QHS Darreld Mclean, MD      . insulin detemir (LEVEMIR) injection 25 Units  25 Units Subcutaneous QHS Darreld Mclean, MD      . LORazepam (ATIVAN) injection 1 mg  1 mg Intravenous PRN Pricilla Loveless, MD   1 mg at 08/15/15 1732  . rosuvastatin (CRESTOR) tablet 20 mg  20 mg Oral QHS Darreld Mclean, MD       Current Outpatient Prescriptions  Medication Sig  Dispense Refill  . amitriptyline (ELAVIL) 25 MG tablet Take 25 mg by mouth at bedtime.     Marland Kitchen aspirin EC 81 MG tablet Take 81 mg by mouth daily.     Marland Kitchen atorvastatin (LIPITOR) 80 MG tablet Take 80 mg by mouth 2 (two) times daily.     . clopidogrel (PLAVIX) 75 MG tablet Take 75 mg by mouth daily.    . Empagliflozin-Linagliptin (GLYXAMBI) 10-5 MG TABS Take 1 tablet by mouth daily.     Marland Kitchen escitalopram (LEXAPRO) 10 MG tablet Take 10 mg by mouth daily.     . insulin detemir (LEVEMIR) 100 UNIT/ML injection Inject 80 Units into the skin daily.    Marland Kitchen lisinopril (PRINIVIL,ZESTRIL) 30 MG tablet Take 30 mg by mouth daily.    . rosuvastatin (CRESTOR) 10 MG tablet Take 10 mg by mouth at bedtime.     Marland Kitchen lisinopril (PRINIVIL,ZESTRIL) 2.5 MG tablet Take 2.5 mg by mouth daily.       Allergies: Allergies as of 08/15/2015 - Review Complete 08/15/2015  Allergen Reaction Noted  . Erythromycin Itching 10/19/2014   Past Medical History  Diagnosis Date  . Diabetes mellitus without complication (HCC)   . Headache   . Hypertension   . Hyperlipidemia    Past Surgical History  Procedure Laterality Date  . Cholecystectomy    . Hernia repair    . Appendectomy    . Foot ganglion excision    . Vaginal hysterectomy    . Tee without cardioversion N/A 04/19/2015    Procedure: TRANSESOPHAGEAL ECHOCARDIOGRAM (TEE);  Surgeon: Jake Bathe, MD;  Location: [facility] ENDOSCOPY;  Service: Cardiovascular;  Laterality: N/A;   Family History  Problem Relation Age of Onset  . COPD Mother   . Stroke Father     59s  . Heart failure Brother   . Cancer Maternal Grandmother     unknown   . COPD    . Heart failure Maternal Grandfather   . Hypertension Maternal Grandfather   . Cancer Paternal Grandfather     lung  . Diabetes Paternal Grandmother   . Heart attack Father 23   Social History   Social History  . Marital Status: Married    Spouse Name: N/A  . Number of Children: N/A  . Years of Education: N/A   Occupational  History  . Data processing manager    Social History Main Topics  . Smoking status: Never Smoker   . Smokeless tobacco: Never Used  . Alcohol Use: No     Comment: rare   . Drug Use: No  . Sexual Activity: No   Other Topics Concern  . Not on file   Social History Narrative    Review of  Systems: Review of Systems  Constitutional: Negative for fever, chills and malaise/fatigue.  Eyes: Negative for blurred vision and double vision.  Respiratory: Negative for cough and shortness of breath.   Cardiovascular: Positive for palpitations. Negative for chest pain.  Gastrointestinal: Positive for nausea. Negative for vomiting, abdominal pain, diarrhea and constipation.  Neurological: Positive for dizziness, sensory change, speech change, focal weakness and headaches. Negative for tingling, tremors, seizures and loss of consciousness.  Psychiatric/Behavioral: Negative for substance abuse.     Physical Exam: Blood pressure 147/80, pulse 86, resp. rate 20, SpO2 99 %. Physical Exam  Constitutional: She is oriented to person, place, and time. She appears well-developed and well-nourished. No distress.  HENT:  Head: Normocephalic and atraumatic.  Mouth/Throat: Oropharynx is clear and moist.  Eyes: EOM are normal. Pupils are equal, round, and reactive to light.  Cardiovascular: Normal rate and regular rhythm.   No murmur heard. Pulmonary/Chest: Effort normal. No respiratory distress. She has no wheezes. She has no rales.  Abdominal: Soft. Bowel sounds are normal. She exhibits no distension. There is no tenderness.  Musculoskeletal: Normal range of motion. She exhibits no edema or tenderness.  Neurological: She is alert and oriented to person, place, and time.  Strength 5/5 all extremities, finger to nose intact, sensation intact to light touch bilaterally. Has difficulty formulating sentences, replacing intended words with "dizzy" numerous times. Patellar tendon reflexes +1  Skin: Skin is warm.  She is not diaphoretic.  Psychiatric: She has a normal mood and affect.     Lab results: Basic Metabolic Panel:  Recent Labs  16/10/96 0820 08/15/15 0841  NA 132* 135  K 4.0 4.1  CL 98* 95*  CO2 28  --   GLUCOSE 370* 369*  BUN 19 23*  CREATININE 0.86 0.80  CALCIUM 9.4  --    Liver Function Tests:  Recent Labs  08/15/15 0820  AST 17  ALT 15  ALKPHOS 91  BILITOT 0.7  PROT 6.7  ALBUMIN 3.1*   No results for input(s): LIPASE, AMYLASE in the last 72 hours. No results for input(s): AMMONIA in the last 72 hours. CBC:  Recent Labs  08/15/15 0820 08/15/15 0841  WBC 6.0  --   NEUTROABS 4.0  --   HGB 12.4 12.9  HCT 38.2 38.0  MCV 83.6  --   PLT 221  --    Cardiac Enzymes: No results for input(s): CKTOTAL, CKMB, CKMBINDEX, TROPONINI in the last 72 hours. BNP: No results for input(s): PROBNP in the last 72 hours. D-Dimer: No results for input(s): DDIMER in the last 72 hours. CBG:  Recent Labs  08/15/15 1516  GLUCAP 160*   Hemoglobin A1C: No results for input(s): HGBA1C in the last 72 hours. Fasting Lipid Panel: No results for input(s): CHOL, HDL, LDLCALC, TRIG, CHOLHDL, LDLDIRECT in the last 72 hours. Thyroid Function Tests: No results for input(s): TSH, T4TOTAL, FREET4, T3FREE, THYROIDAB in the last 72 hours. Anemia Panel: No results for input(s): VITAMINB12, FOLATE, FERRITIN, TIBC, IRON, RETICCTPCT in the last 72 hours. Coagulation:  Recent Labs  08/15/15 0820  LABPROT 14.1  INR 1.07   Urine Drug Screen: Drugs of Abuse     Component Value Date/Time   LABOPIA NONE DETECTED 08/15/2015 0852   COCAINSCRNUR NONE DETECTED 08/15/2015 0852   LABBENZ NONE DETECTED 08/15/2015 0852   AMPHETMU NONE DETECTED 08/15/2015 0852   THCU NONE DETECTED 08/15/2015 0852   LABBARB NONE DETECTED 08/15/2015 0852    Alcohol Level:  Recent Labs  08/15/15 0820  ETH <5   Urinalysis:  Recent Labs  08/15/15 0852  COLORURINE YELLOW  LABSPEC 1.017  PHURINE 7.0   GLUCOSEU >1000*  HGBUR MODERATE*  BILIRUBINUR NEGATIVE  KETONESUR NEGATIVE  PROTEINUR 100*  NITRITE POSITIVE*  LEUKOCYTESUR NEGATIVE     Imaging results:  Ct Head Wo Contrast  08/15/2015  CLINICAL DATA:  Patient with headache, dizziness and stumbling. Slurred speech. Right-sided weakness. EXAM: CT HEAD WITHOUT CONTRAST TECHNIQUE: Contiguous axial images were obtained from the base of the skull through the vertex without intravenous contrast. COMPARISON:  Brain CT 05/13/2015. FINDINGS: Ventricles and sulci are appropriate for patient's age. Periventricular and subcortical white matter hypodensity compatible with chronic microvascular ischemic changes. 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 No evidence for acute cortically based infarct, intracranial hemorrhage, mass lesion or mass-effect. Calvarium is intact. Paranasal sinuses are well aerated. Mastoid air cells unremarkable. IMPRESSION: No acute intracranial process. Chronic microvascular ischemic changes. Critical Value/emergent results were called by telephone at the time of interpretation on 08/15/2015 at 8:54 am to Dr. Lavon Paganini, who verbally acknowledged these results. Electronically Signed   By: Annia Belt M.D.   On: 08/15/2015 08:56   Mr Brain Ltd W/o Cm  08/15/2015  CLINICAL DATA:  50 year old female with acute onset slurred speech and headache upon waking today. Limited diffusion only Brain MRI requested by Neurology. EXAM: LIMITED MRI HEAD WITHOUT CONTRAST TECHNIQUE: Multiplanar, multiecho pulse sequences of the brain and surrounding structures were obtained without intravenous contrast. COMPARISON:  Head CT without contrast 0827 hours today. [facility] Brain MRI 05/13/2015. [facility] brain MRI 02/22/2015. FINDINGS: Axial in coronal diffusion weighted imaging only as requested by Neurology. Small 8-9 mm focus of restricted diffusion involving the  right corona radiata and some subcortical white matter in the posterior right superior frontal lobe (series 3, image 36 and series 4, image 14). This appears to be separate from a small linear area of chronic T2 and FLAIR white matter signal abnormality here seen on the prior studies. No evidence of associated mass effect. No contralateral or other restricted diffusion. No ventriculomegaly. Stable cerebral volume. Facilitated diffusion in the Areas of white matter adjacent to the right frontal horn and caudate nucleus as well as in the bilateral periatrial white matter, which appears to correspond to chronic small vessel ischemia. IMPRESSION: 1. 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 2. Evidence of underlying chronic small vessel disease as seen on the 05/13/2015 comparison. Electronically Signed   By: Odessa Fleming M.D.   On: 08/15/2015 10:38    Other results: EKG: sinus rhythm, non-specific t-wave changes.  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 Neurology was consulted in the ED. Per ED note, "Dr. Lavon Paganini looks at images he thinks it's similar to Feb 2017 and thinks it's not a CVA. Recommends full MRI w/ and w/o contrast." Patient's LUE weakness is resolved, but has continued issues with word finding. If this is considered a new stroke, this would mean she has failed ASA and Plavix as secondary prevention.  - Neurology consulted, recs appreciated - MRI, MRA brain with and without contrast per  neuro - Continue home Asp 81 mg daily and Plavix 75 mg daily - May need to consider Aggrenox on discharge - Increase home Rosuvastatin to 20  mg - Order Echocardiogram and Carotid dopplers - Risk factor stratification: Hgb A1c, lipid panel - PT, OT, and Speech consult  Hypertensive urgency: Systolic BPs up to 227. Given labetalol 10 mg IV in the ED and pressure has been in the 140-160s systolic since then.  -Hold home lisinopril for permissive hypertension  Headache: Likely 2/2 hypertensive urgency vs history of tension headaches.Takes amitryptiline 25 mg qhs and ibuprofen 800 mg at home for this. Given toradol 15 mg injection and baclofen 10 mg PO in the ED without resolution. - Ibuprofen as needed  Diabetes Mellitus: Blood glucose was 370 on arrival to the ED. Hgb A1c pending - f/u Hgb A1c. - Changed detemir to 25 units basal dosing with moderate SSI.   Hyperlipidemia: Curiously takes both atorvastatin 80 mg BID and rosuvastatin 10 mg qhs at home. - f/u lipid panel - Rosuvastatin 20 mg as above   Diet: normal diet  Prophylaxis: Lovenox  Code: Full   Dispo: Disposition is deferred at this time, awaiting improvement of current medical problems. Anticipated discharge in approximately 1-2 day(s).   The patient does have a current PCP Arvin Collard II, MD) and does not need an [facility] hospital follow-up appointment after discharge.  The patient does not have transportation limitations that hinder transportation to clinic appointments.  Signed: Darreld Mclean, MD 08/15/2015, 7:20 PM

## 2015-08-15 NOTE — ED Notes (Signed)
Attempted report. Admitting MDs at bedside.

## 2015-08-15 NOTE — ED Provider Notes (Signed)
CSN: [id number]     Arrival date & time 08/15/15  0813 History   First MD Initiated Contact with Patient 08/15/15 0818     No chief complaint on file.    (Consider location/radiation/quality/duration/timing/severity/associated sxs/prior Treatment) HPI  50 year old female presents by EMS with acute numbness and weakness. Patient states she had a headache that woke her up at 3 AM. Headache went from her front to her back. She states she's had headaches like this before. Most recently was when she had a stroke in the past. She gets headaches like this often. She took 2 Excedrin and went back to sleep. Around 6 exam got up again and states that she did not have much of a headache at all. Went to work and the headache seemed to return. Developed right-sided numbness and heaviness in her arm. Did not notice any leg symptoms. Has been having slurred speech as well. She feels like some the symptoms are improving currently. The numbness and speech symptoms started around 7:30. EMS noted her to be hypertensive with a blood pressure of 240/120. Currently has the headache.  Past Medical History  Diagnosis Date  . Diabetes mellitus without complication (HCC)   . Headache   . Hypertension   . Hyperlipidemia    Past Surgical History  Procedure Laterality Date  . Cholecystectomy    . Hernia repair    . Appendectomy    . Foot ganglion excision    . Vaginal hysterectomy    . Tee without cardioversion N/A 04/19/2015    Procedure: TRANSESOPHAGEAL ECHOCARDIOGRAM (TEE);  Surgeon: Jake Bathe, MD;  Location: [facility] ENDOSCOPY;  Service: Cardiovascular;  Laterality: N/A;   Family History  Problem Relation Age of Onset  . COPD Mother   . Stroke Father   . Heart failure Brother   . Cancer Maternal Grandmother     unknown   . COPD    . Heart failure Maternal Grandfather   . Hypertension Maternal Grandfather   . Cancer Paternal Grandfather     lung  . Diabetes Paternal Grandmother    Social History   Substance Use Topics  . Smoking status: Never Smoker   . Smokeless tobacco: Never Used  . Alcohol Use: 0.0 oz/week    0 Standard drinks or equivalent per week     Comment: rare    OB History    No data available     Review of Systems  Unable to perform ROS: Acuity of condition      Allergies  Erythromycin  Home Medications   Prior to Admission medications   Medication Sig Start Date End Date Taking? Authorizing Provider  amitriptyline (ELAVIL) 25 MG tablet Take 25 mg by mouth 2 (two) times daily.     Historical Provider, MD  aspirin EC 81 MG tablet Take 81 mg by mouth daily.     Historical Provider, MD  atorvastatin (LIPITOR) 80 MG tablet Take 80 mg by mouth 2 (two) times daily.     Historical Provider, MD  clopidogrel (PLAVIX) 75 MG tablet Take 75 mg by mouth daily.    Historical Provider, MD  Empagliflozin-Linagliptin (GLYXAMBI) 10-5 MG TABS Take 1 tablet by mouth daily.     Historical Provider, MD  escitalopram (LEXAPRO) 10 MG tablet Take 10 mg by mouth daily.     Historical Provider, MD  insulin detemir (LEVEMIR) 100 UNIT/ML injection Inject 80 Units into the skin daily.    Historical Provider, MD  lisinopril (PRINIVIL,ZESTRIL) 2.5 MG tablet Take 2.5  mg by mouth daily.     Historical Provider, MD  rosuvastatin (CRESTOR) 10 MG tablet Take 10 mg by mouth daily.     Historical Provider, MD   BP 178/109 mmHg  Pulse 87  Resp 15  SpO2 100% Physical Exam  Constitutional: She appears well-developed and well-nourished.  HENT:  Head: Normocephalic and atraumatic.  Right Ear: External ear normal.  Left Ear: External ear normal.  Nose: Nose normal.  Eyes: EOM are normal. Pupils are equal, round, and reactive to light. Right eye exhibits no discharge. Left eye exhibits no discharge.  Neck: Neck supple.  Cardiovascular: Normal rate, regular rhythm and normal heart sounds.   Pulmonary/Chest: Effort normal and breath sounds normal.  Abdominal: Soft. There is no tenderness.   Neurological: She is alert. She is disoriented.  CN 3-12 grossly intact. No facial droop. Slurred speech. Seems mildly confused. Mild decreased strength in RUE compared to LUE (although this seems somewhat weak/poor effort as well). RLE barely able to be lifted off bed. LLE seems strained to lift off bed but is stronger.  Skin: Skin is warm and dry.  Nursing note and vitals reviewed.   ED Course  Procedures (including critical care time) Labs Review Labs Reviewed  COMPREHENSIVE METABOLIC PANEL - Abnormal; Notable for the following:    Sodium 132 (*)    Chloride 98 (*)    Glucose, Bld 370 (*)    Albumin 3.1 (*)    All other components within normal limits  URINALYSIS, ROUTINE W REFLEX MICROSCOPIC (NOT [facility]) - Abnormal; Notable for the following:    APPearance CLOUDY (*)    Glucose, UA >1000 (*)    Hgb urine dipstick MODERATE (*)    Protein, ur 100 (*)    Nitrite POSITIVE (*)    All other components within normal limits  URINE MICROSCOPIC-ADD ON - Abnormal; Notable for the following:    Squamous Epithelial / LPF 0-5 (*)    Bacteria, UA MANY (*)    Casts HYALINE CASTS (*)    All other components within normal limits  I-STAT CHEM 8, ED - Abnormal; Notable for the following:    Chloride 95 (*)    BUN 23 (*)    Glucose, Bld 369 (*)    All other components within normal limits  CBG MONITORING, ED - Abnormal; Notable for the following:    Glucose-Capillary 160 (*)    All other components within normal limits  ETHANOL  PROTIME-INR  APTT  CBC  DIFFERENTIAL  URINE RAPID DRUG SCREEN, HOSP PERFORMED  I-STAT TROPOININ, ED    Imaging Review Ct Head Wo Contrast  08/15/2015  CLINICAL DATA:  Patient with headache, dizziness and stumbling. Slurred speech. Right-sided weakness. EXAM: CT HEAD WITHOUT CONTRAST TECHNIQUE: Contiguous axial images were obtained from the base of the skull through the vertex without intravenous contrast. COMPARISON:  Brain CT 05/13/2015. FINDINGS: Ventricles  and sulci are appropriate for patient's age. Periventricular and subcortical white matter hypodensity compatible with chronic microvascular ischemic changes. 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 No evidence for acute cortically based infarct, intracranial hemorrhage, mass lesion or mass-effect. Calvarium is intact. Paranasal sinuses are well aerated. Mastoid air cells unremarkable. IMPRESSION: No acute intracranial process. Chronic microvascular ischemic changes. Critical Value/emergent results were called by telephone at the time of interpretation on 08/15/2015 at 8:54 am to Dr. Lavon PaganiniNandigam, who verbally acknowledged these results. Electronically Signed   By: Francis Gainesrew  Davis M.D.  On: 08/15/2015 08:56   Mr Brain Ltd W/o Cm  08/15/2015  CLINICAL DATA:  50 year old female with acute onset slurred speech and headache upon waking today. Limited diffusion only Brain MRI requested by Neurology. EXAM: LIMITED MRI HEAD WITHOUT CONTRAST TECHNIQUE: Multiplanar, multiecho pulse sequences of the brain and surrounding structures were obtained without intravenous contrast. COMPARISON:  Head CT without contrast 0827 hours today. [facility] Brain MRI 05/13/2015. [facility] brain MRI 02/22/2015. FINDINGS: Axial in coronal diffusion weighted imaging only as requested by Neurology. Small 8-9 mm focus of restricted diffusion involving the right corona radiata and some subcortical white matter in the posterior right superior frontal lobe (series 3, image 36 and series 4, image 14). This appears to be separate from a small linear area of chronic T2 and FLAIR white matter signal abnormality here seen on the prior studies. No evidence of associated mass effect. No contralateral or other restricted diffusion. No ventriculomegaly. Stable cerebral volume. Facilitated diffusion in the Areas of white matter adjacent to the right frontal horn and  caudate nucleus as well as in the bilateral periatrial white matter, which appears to correspond to chronic small vessel ischemia. IMPRESSION: 1. 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 2. Evidence of underlying chronic small vessel disease as seen on the 05/13/2015 comparison. Electronically Signed   By: Odessa Fleming M.D.   On: 08/15/2015 10:38   I have personally reviewed and evaluated these images and lab results as part of my medical decision-making.   EKG Interpretation   Date/Time:  Thursday August 15 2015 08:19:41 EDT Ventricular Rate:  86 PR Interval:  157 QRS Duration: 85 QT Interval:  372 QTC Calculation: 445 R Axis:   76 Text Interpretation:  Sinus rhythm nonspecific T waves No old tracing to  compare Confirmed by Adaia Matthies MD, Dyllin Gulley [phone]) on 08/15/2015 8:35:15 AM      MDM   Final diagnoses:  Acute nonintractable headache, unspecified headache type    Code stroke called given acute numbness and slurred speech, but neuro exam is inconsistent. Neuro feels this unlikely to be acute CVA, thus will hold on TPA. MRI shows possible acute CVA but when Dr. Lavon Paganini looks at images he thinks it's similar to Feb 2017 and thinks it's not a CVA. Recommends full MRI w/ and w/o contrast and admission to medicine for BP/DM control. Headache is better. No specific BP goal per neuro but do not need to permissively keep somewhat hypertensive as he doesn't feel a CVA is present. Admit to internal medicine teaching service.     Pricilla Loveless, MD 08/15/15 [phone]

## 2015-08-15 NOTE — ED Notes (Signed)
Pt given graham crackers and coke. Meal tray ordered for patient. Pt family at bedside. Pt is drowsy, awaiting bed placement, MRI, admitting team consult.

## 2015-08-15 NOTE — ED Notes (Signed)
Patient undressed, in gown, on monitor, continuous pulse oximetry and blood pressure cuff 

## 2015-08-15 NOTE — Consult Note (Signed)
Requesting Physician: Dr. Criss Alvine    Chief Complaint: HA and hypertension  History obtained from:  Patient     HPI:                                                                                                                                         Kelly Romero is an 50 y.o. female with PMHx of HA. She awoke and followed by Dr Everlena Cooper neurology as out patient. Per his previous notes, "She had a similar stroke-like episode on 02/22/15. She woke up with headache and later developed slurred speech. She was admitted to [facility], where MRI of brain revealed 2 mm left occipital subacute infarct. 2D echo was performed, which appeared unremarkable, but didn't make mention of a PFO. Carotid doppler again showed no hemodynamically significant ICA stenosis. Hypercoagulable panel, ANA, lupus panel, antiextractable nuclear antigen were negative. LDL was 130. I am uncertain how elevated was her blood pressure. Plavix was added to her ASA. TEE showed no PFO on 04/2015".    Today she awoke at 0300 and noted she had a HA and was off balance when walking to the bathroom. She later woke up at 0600 and got ready and went to work. While at work patient developed right sided weakness and numbness as well as felt "forgetful" and EMS was called. Code stroke called on patient arrival. Patient hypertensive on arrival. Patient to CT.NIHSS 4. CT head was negative. On exam she was slow to speak but had no dysarthria or aphasia. Code stroke was called off due to minimal symptoms. HE BP remained elevated and medication was administered to lower the BP.    Date last known well: 6.1.2017 Time last known well: Time: 03:00 tPA Given: No: minimal symptoms   Past Medical History  Diagnosis Date  . Diabetes mellitus without complication (HCC)   . Headache   . Hypertension   . Hyperlipidemia     Past Surgical History  Procedure Laterality Date  . Cholecystectomy    . Hernia repair    . Appendectomy     . Foot ganglion excision    . Vaginal hysterectomy    . Tee without cardioversion N/A 04/19/2015    Procedure: TRANSESOPHAGEAL ECHOCARDIOGRAM (TEE);  Surgeon: Jake Bathe, MD;  Location: [facility] ENDOSCOPY;  Service: Cardiovascular;  Laterality: N/A;    Family History  Problem Relation Age of Onset  . COPD Mother   . Stroke Father   . Heart failure Brother   . Cancer Maternal Grandmother     unknown   . COPD    . Heart failure Maternal Grandfather   . Hypertension Maternal Grandfather   . Cancer Paternal Grandfather     lung  . Diabetes Paternal Grandmother    Social History:  reports that she has never smoked. She has never used smokeless tobacco. She reports that she drinks alcohol. She reports  that she does not use illicit drugs.  Allergies:  Allergies  Allergen Reactions  . Erythromycin Itching    Medications:                                                                                                                           Current Facility-Administered Medications  Medication Dose Route Frequency Provider Last Rate Last Dose  . baclofen (LIORESAL) tablet 10 mg  10 mg Oral Once Ram Daniel Nones, MD      . ketorolac (TORADOL) 15 MG/ML injection 15 mg  15 mg Intravenous Once Ram Daniel Nones, MD      . LORazepam (ATIVAN) injection 0.5 mg  0.5 mg Intravenous Once Ram Daniel Nones, MD      . pantoprazole (PROTONIX) injection 40 mg  40 mg Intravenous Once Ram Daniel Nones, MD      . valproate (DEPACON) 500 mg in dextrose 5 % 50 mL IVPB  500 mg Intravenous Once Ram Daniel Nones, MD       Current Outpatient Prescriptions  Medication Sig Dispense Refill  . amitriptyline (ELAVIL) 25 MG tablet Take 25 mg by mouth 2 (two) times daily.     Marland Kitchen aspirin EC 81 MG tablet Take 81 mg by mouth daily.     Marland Kitchen atorvastatin (LIPITOR) 80 MG tablet Take 80 mg by mouth 2 (two) times daily.     . clopidogrel (PLAVIX) 75 MG tablet Take 75 mg  by mouth daily.    . Empagliflozin-Linagliptin (GLYXAMBI) 10-5 MG TABS Take 1 tablet by mouth daily.     Marland Kitchen escitalopram (LEXAPRO) 10 MG tablet Take 10 mg by mouth daily.     . insulin detemir (LEVEMIR) 100 UNIT/ML injection Inject 80 Units into the skin daily.    Marland Kitchen lisinopril (PRINIVIL,ZESTRIL) 2.5 MG tablet Take 2.5 mg by mouth daily.     . rosuvastatin (CRESTOR) 10 MG tablet Take 10 mg by mouth daily.        ROS:                                                                                                                                       History obtained from the patient  General ROS: negative for - chills, fatigue, fever, night sweats, weight gain or weight loss Psychological ROS: negative for - behavioral disorder, hallucinations, memory  difficulties, mood swings or suicidal ideation Ophthalmic ROS: negative for - blurry vision, double vision, eye pain or loss of vision ENT ROS: negative for - epistaxis, nasal discharge, oral lesions, sore throat, tinnitus or vertigo Allergy and Immunology ROS: negative for - hives or itchy/watery eyes Hematological and Lymphatic ROS: negative for - bleeding problems, bruising or swollen lymph nodes Endocrine ROS: negative for - galactorrhea, hair pattern changes, polydipsia/polyuria or temperature intolerance Respiratory ROS: negative for - cough, hemoptysis, shortness of breath or wheezing Cardiovascular ROS: negative for - chest pain, dyspnea on exertion, edema or irregular heartbeat Gastrointestinal ROS: negative for - abdominal pain, diarrhea, hematemesis, nausea/vomiting or stool incontinence Genito-Urinary ROS: negative for - dysuria, hematuria, incontinence or urinary frequency/urgency Musculoskeletal ROS: negative for - joint swelling or muscular weakness Neurological ROS: as noted in HPI Dermatological ROS: negative for rash and skin lesion changes  Neurologic Examination:                                                                                                       Blood pressure 206/86, pulse 93, resp. rate 14, SpO2 100 %.  HEENT-  Normocephalic, no lesions, without obvious abnormality.  Normal external eye and conjunctiva.  Normal TM's bilaterally.  Normal auditory canals and external ears. Normal external nose, mucus membranes and septum.  Normal pharynx. Cardiovascular- S1, S2 normal, pulses palpable throughout   Lungs- chest clear, no wheezing, rales, normal symmetric air entry Abdomen- normal findings: bowel sounds normal Extremities- no edema Lymph-no adenopathy palpable Musculoskeletal-no joint tenderness, deformity or swelling Skin-warm and dry, no hyperpigmentation, vitiligo, or suspicious lesions  Neurological Examination Mental Status: Alert, oriented, thought content appropriate.  Speech fluent without evidence of aphasia.  Able to follow 3 step commands without difficulty. Cranial Nerves: II: Visual fields grossly normal, pupils equal, round, reactive to light and accommodation III,IV, VI: ptosis not present, extra-ocular motions intact bilaterally V,VII: smile symmetric, facial light touch sensation decreased on the right and splits midline.  VIII: hearing normal bilaterally IX,X: uvula rises symmetrically XI: bilateral shoulder shrug XII: midline tongue extension Motor: Right : Upper extremity   5/5    Left:     Upper extremity   5/5  Lower extremity   5/5     Lower extremity   5/5 Tone and bulk:normal tone throughout; no atrophy noted Sensory: Pinprick and light touch intact throughout, bilaterally Deep Tendon Reflexes: 1+ and symmetric throughout Plantars: Right: downgoing   Left: downgoing Cerebellar: normal finger-to-nose, normal rapid alternating movements and normal heel-to-shin test Gait: not tested       Lab Results: Basic Metabolic Panel:  Recent Labs Lab 08/15/15 0841  NA 135  K 4.1  CL 95*  GLUCOSE 369*  BUN 23*  CREATININE 0.80    Liver Function Tests: No  results for input(s): AST, ALT, ALKPHOS, BILITOT, PROT, ALBUMIN in the last 168 hours. No results for input(s): LIPASE, AMYLASE in the last 168 hours. No results for input(s): AMMONIA in the last 168 hours.  CBC:  Recent Labs Lab 08/15/15 0820 08/15/15 0841  WBC 6.0  --  NEUTROABS 4.0  --   HGB 12.4 12.9  HCT 38.2 38.0  MCV 83.6  --   PLT 221  --     Cardiac Enzymes: No results for input(s): CKTOTAL, CKMB, CKMBINDEX, TROPONINI in the last 168 hours.  Lipid Panel: No results for input(s): CHOL, TRIG, HDL, CHOLHDL, VLDL, LDLCALC in the last 168 hours.  CBG: No results for input(s): GLUCAP in the last 168 hours.  Microbiology: Results for orders placed or performed in visit on 10/29/13  Urine culture     Status: None   Collection Time: 10/29/13  6:01 PM  Result Value Ref Range Status   Micro Text Report   Final       SOURCE: CLEAN CATCH    ORGANISM 1                >100,000 CFU/ML ESCHERICHIA COLI   COMMENT                   WITH MIXED-BACTERIAL-ORGANISMS   ANTIBIOTIC                    ORG#1     AMPICILLIN                    S         CEFAZOLIN                     S         CEFOXITIN                     S         CEFTRIAXONE                   S         CIPROFLOXACIN                 S         GENTAMICIN                    S         IMIPENEM                      S         LEVOFLOXACIN                  S         NITROFURANTOIN                S         TRIMETHOPRIM/SULFAMETHOXAZOLE S             Coagulation Studies: No results for input(s): LABPROT, INR in the last 72 hours.  Imaging: No results found.     Assessment and plan discussed with with attending physician and they are in agreement.    Felicie Morn PA-C [facility] [phone]  08/15/2015, 8:48 AM   Assessment: 50 y.o. female presenting in hypertensive urgency, HA and right sided weakness. Exam shows right arm and leg drift but no notable weakness. CT head was negative. At this time  she is not a tPA candidate but cannot exclude CVA given her elevated BP. Patient would still benefit from Stroke work up. She currently is on ASA and Plavix.   Stroke Risk Factors - diabetes mellitus, hyperlipidemia and hypertension  Recommend: 1. HgbA1c, fasting lipid panel 2. MRI, MRA  of the brain without contrast 3. PT consult, OT consult, Speech consult 4. Echocardiogram 5. Carotid dopplers 6. Prophylactic therapy-Antiplatelet med: Aspirin - dose 81 mg  and Antiplatelet med: Plavix - dose 75 mg daily 7. Risk factor modification 8. Telemetry monitoring 9. Frequent neuro checks 10 NPO until passes stroke swallow screen 11 please page stroke NP  Or  PA  Or MD from 8am -4 pm  as this patient from this time will be  followed by the stroke.   You can look them up on [email or web address]  Password [email or web address]

## 2015-08-16 ENCOUNTER — Inpatient Hospital Stay (HOSPITAL_BASED_OUTPATIENT_CLINIC_OR_DEPARTMENT_OTHER): Payer: 59

## 2015-08-16 ENCOUNTER — Inpatient Hospital Stay (HOSPITAL_COMMUNITY): Payer: 59

## 2015-08-16 ENCOUNTER — Encounter (HOSPITAL_COMMUNITY): Payer: 59

## 2015-08-16 DIAGNOSIS — G9340 Encephalopathy, unspecified: Secondary | ICD-10-CM | POA: Diagnosis not present

## 2015-08-16 DIAGNOSIS — G43709 Chronic migraine without aura, not intractable, without status migrainosus: Secondary | ICD-10-CM | POA: Diagnosis not present

## 2015-08-16 DIAGNOSIS — N39498 Other specified urinary incontinence: Secondary | ICD-10-CM | POA: Diagnosis not present

## 2015-08-16 DIAGNOSIS — R4182 Altered mental status, unspecified: Secondary | ICD-10-CM | POA: Diagnosis not present

## 2015-08-16 DIAGNOSIS — F418 Other specified anxiety disorders: Secondary | ICD-10-CM | POA: Diagnosis not present

## 2015-08-16 DIAGNOSIS — F419 Anxiety disorder, unspecified: Secondary | ICD-10-CM

## 2015-08-16 DIAGNOSIS — R8271 Bacteriuria: Secondary | ICD-10-CM | POA: Diagnosis not present

## 2015-08-16 DIAGNOSIS — M6248 Contracture of muscle, other site: Secondary | ICD-10-CM | POA: Diagnosis not present

## 2015-08-16 DIAGNOSIS — Z8673 Personal history of transient ischemic attack (TIA), and cerebral infarction without residual deficits: Secondary | ICD-10-CM

## 2015-08-16 DIAGNOSIS — I6789 Other cerebrovascular disease: Secondary | ICD-10-CM

## 2015-08-16 DIAGNOSIS — F329 Major depressive disorder, single episode, unspecified: Secondary | ICD-10-CM | POA: Diagnosis present

## 2015-08-16 DIAGNOSIS — R29818 Other symptoms and signs involving the nervous system: Secondary | ICD-10-CM | POA: Diagnosis not present

## 2015-08-16 DIAGNOSIS — R410 Disorientation, unspecified: Secondary | ICD-10-CM | POA: Diagnosis not present

## 2015-08-16 DIAGNOSIS — G43909 Migraine, unspecified, not intractable, without status migrainosus: Secondary | ICD-10-CM | POA: Diagnosis not present

## 2015-08-16 DIAGNOSIS — I10 Essential (primary) hypertension: Secondary | ICD-10-CM

## 2015-08-16 LAB — COMPREHENSIVE METABOLIC PANEL
ALT: 16 U/L (ref 14–54)
ANION GAP: 8 (ref 5–15)
AST: 20 U/L (ref 15–41)
Albumin: 2.9 g/dL — ABNORMAL LOW (ref 3.5–5.0)
Alkaline Phosphatase: 96 U/L (ref 38–126)
BILIRUBIN TOTAL: 0.7 mg/dL (ref 0.3–1.2)
BUN: 18 mg/dL (ref 6–20)
CHLORIDE: 99 mmol/L — AB (ref 101–111)
CO2: 29 mmol/L (ref 22–32)
Calcium: 9.3 mg/dL (ref 8.9–10.3)
Creatinine, Ser: 0.88 mg/dL (ref 0.44–1.00)
Glucose, Bld: 226 mg/dL — ABNORMAL HIGH (ref 65–99)
POTASSIUM: 3.6 mmol/L (ref 3.5–5.1)
Sodium: 136 mmol/L (ref 135–145)
TOTAL PROTEIN: 6.7 g/dL (ref 6.5–8.1)

## 2015-08-16 LAB — BLOOD GAS, ARTERIAL
ACID-BASE EXCESS: 3.8 mmol/L — AB (ref 0.0–2.0)
BICARBONATE: 27.6 meq/L — AB (ref 20.0–24.0)
Drawn by: 275531
FIO2: 0.21
O2 SAT: 94.8 %
PATIENT TEMPERATURE: 98.6
PO2 ART: 72.2 mmHg — AB (ref 80.0–100.0)
TCO2: 28.8 mmol/L (ref 0–100)
pCO2 arterial: 40.2 mmHg (ref 35.0–45.0)
pH, Arterial: 7.451 — ABNORMAL HIGH (ref 7.350–7.450)

## 2015-08-16 LAB — LIPID PANEL
CHOLESTEROL: 307 mg/dL — AB (ref 0–200)
HDL: 37 mg/dL — ABNORMAL LOW (ref >40)
LDL CALC: 231 mg/dL — AB (ref 0–99)
Total CHOL/HDL Ratio: 8.3 RATIO
Triglycerides: 193 mg/dL — ABNORMAL HIGH (ref <150)
VLDL: 39 mg/dL (ref 0–40)

## 2015-08-16 LAB — ECHOCARDIOGRAM LIMITED
HEIGHTINCHES: 64 in
WEIGHTICAEL: 3622.6 [oz_av]

## 2015-08-16 LAB — CBC
HEMATOCRIT: 40.3 % (ref 36.0–46.0)
Hemoglobin: 12.9 g/dL (ref 12.0–15.0)
MCH: 27 pg (ref 26.0–34.0)
MCHC: 32 g/dL (ref 30.0–36.0)
MCV: 84.3 fL (ref 78.0–100.0)
Platelets: 246 10*3/uL (ref 150–400)
RBC: 4.78 MIL/uL (ref 3.87–5.11)
RDW: 13.2 % (ref 11.5–15.5)
WBC: 11.7 10*3/uL — AB (ref 4.0–10.5)

## 2015-08-16 LAB — GLUCOSE, CAPILLARY
GLUCOSE-CAPILLARY: 180 mg/dL — AB (ref 65–99)
GLUCOSE-CAPILLARY: 219 mg/dL — AB (ref 65–99)
Glucose-Capillary: 244 mg/dL — ABNORMAL HIGH (ref 65–99)
Glucose-Capillary: 269 mg/dL — ABNORMAL HIGH (ref 65–99)
Glucose-Capillary: 207 mg/dL — ABNORMAL HIGH (ref 65–99)

## 2015-08-16 MED ORDER — CEFTRIAXONE SODIUM 2 G IJ SOLR
2.0000 g | Freq: Once | INTRAMUSCULAR | Status: AC
  Administered 2015-08-16: 2 g via INTRAVENOUS
  Filled 2015-08-16: qty 2

## 2015-08-16 MED ORDER — HALOPERIDOL LACTATE 5 MG/ML IJ SOLN: 5.0000 mg | Freq: Once | INTRAMUSCULAR | Status: DC

## 2015-08-16 MED ORDER — HALOPERIDOL LACTATE 5 MG/ML IJ SOLN
5.0000 mg | Freq: Once | INTRAMUSCULAR | Status: AC
  Administered 2015-08-16: 5 mg via INTRAVENOUS
  Filled 2015-08-16: qty 1

## 2015-08-16 MED ORDER — LORAZEPAM 2 MG/ML IJ SOLN
2.0000 mg | Freq: Once | INTRAMUSCULAR | Status: AC
Start: 2015-08-16 — End: 2015-08-16
  Administered 2015-08-16: 2 mg via INTRAVENOUS

## 2015-08-16 NOTE — Significant Event (Signed)
Rapid Response Event Note  Overview: Time Called: 0648 Arrival Time: 0650 Event Type: Neurologic  Initial Focused Assessment: Met Wes as he was transporting pt to CT (HEAD), pt initially calm in bed, during transport, patient became agitated, tried to get out of bed, patient's behavior persisted while on CT table, patient was shouting, speech was incomprehensible, patient was still delirious. Unable to CT HEAD, IM team resident/attending arrived in CT, CT cancelled per MD, patient taken back to room.  Once in the room, patient was incontinent, patient vomited, patient was changed, IV redressed, and while blood was being obtained, patient again became extremely agitated, physically aggressive, impulsive, attempted to get of out bed, IM Team paged again, I spoke with resident, informed him on current patient's status (patient was hypertensive/tachycardiac/agitated etc),  patient given '5mg'$  IV Haldol per MD.  Patient did relax after several minutes.  Primary RN informed to call primary if another episode occurs.  Interventions: - Patient's IV dressing changed - Patient given '5mg'$  IV Haldol - Patient suctioned after vomiting episode x 1  Event Summary: Name of Physician Notified: [place] and Residents at [phone]    at    Outcome: Stayed in room and stabalized  Event End Time: 0900  Benjamim Harnish, [place]

## 2015-08-16 NOTE — Progress Notes (Signed)
RT called and requested that I attempt to draw ABG at 1620. Pt was using bathroom and being cleaned by NT.  ABG was drawn at 17:20 when pt was first available.

## 2015-08-16 NOTE — Progress Notes (Addendum)
Kelly Romero is a 50 y.o. female patient admitted from ED awake, responds to voice - oriented  X 3 - no acute distress noted.  VSS - Blood pressure 161/86, pulse 87, temperature 99.5 F (37.5 C), temperature source Oral, resp. rate 17, height 5\' 4"  (1.626 m), weight 102.7 kg (226 lb 6.6 oz), SpO2 98 %.    IV in place, occlusive dsg intact without redness.  Orientation to room, and floor completed with information packet given to patient/family.  Patient declined safety video at this time.  Admission INP armband ID verified with patient/family, and in place.   SR up x 2, fall assessment complete, with family able to verbalize understanding of risk associated with falls, and verbalized understanding to call nsg before up out of bed.  Call light within reach.  Skin, clean-dry- intact without evidence of bruising, or skin tears.  No evidence of skin break down noted on exam.     Will continue to evaluate and treat per MD orders.  Otis Dialsassandra J Earsel Shouse, RN 08/15/2015 8:00 PM

## 2015-08-16 NOTE — Progress Notes (Signed)
Inpatient Diabetes Program Recommendations  AACE/ADA: New Consensus Statement on Inpatient Glycemic Control (2015)  Target Ranges:  Prepandial:   less than 140 mg/dL      Peak postprandial:   less than 180 mg/dL (1-2 hours)      Critically ill patients:  140 - 180 mg/dL   Lab Results  Component Value Date   GLUCAP 269* 08/16/2015    Review of Glycemic Control  Diabetes history: DM 2  Outpatient Diabetes medications: Empagliflozin-Linagliptin 10-5 mg Daily, Levemir 80 units Daily Current orders for Inpatient glycemic control: Levemir 25 units Daily, Novolog Moderate + HS scale  Inpatient Diabetes Program Recommendations:   Glucose in the 200's after meals. Please consider starting Novolog 5 units TID meal coverage in addition to correction scale if patient is consuming at least 50% of meals.  Thanks,  Christena DeemShannon Jorje Vanatta RN, MSN, [facility] Inpatient Diabetes Coordinator Team Pager [phone] (8a-5p)

## 2015-08-16 NOTE — Progress Notes (Signed)
Called at bedside when patient when to CT sometime around 7-730am. She was resting in bed and did not answer many questions. She had good muscle strength and was able to move all extremities as evident by when she resisted us during neurologic evaluation. Pupils were round and reactive to light, symmetric in size. She was not in any respiratory distress.   Per nursing, moving her to the machine seemed to worsen her agitation.   We asked vitals to be repeated upon returning to the floor. At this point, it is difficult to discern the etiology of her delirium. We have ordered repeat labwork this morning to help us better identify any toxic/metabolic etiologies.

## 2015-08-16 NOTE — Progress Notes (Signed)
Subjective: Patient with several episodes of significant agitation this morning, screaming spells, physical aggression, emesis, and bowel/bladder incontinence. Was unable to complete CT head this morning due to agitation when transferred to imaging bed. She has waxing and waning of severe agitation to somnolence. Appears that her agitation worsens when she feels the need to urinate. Husband reports similar episodes when hospitalized for prior CVA. Neurology has reassessed patient and do not feel that she has had an acute CVA, and believe a lot of her current symptoms are psychiatric in nature.  Objective: Vital signs in last 24 hours: Filed Vitals:   08/15/15 2300 08/16/15 0514 08/16/15 0839 08/16/15 1146  BP:  137/61 130/111 155/80  Pulse:  88 110 111  Temp:  99 F (37.2 C)  99.7 F (37.6 C)  TempSrc:  Oral  Axillary  Resp:  16  18  Height:  (1.626 m)     Weight: 226 lb 6.6 oz (102.7 kg)     SpO2:  91% 95% 95%   Weight change:   Intake/Output Summary (Last 24 hours) at 08/16/15 1340 Last data filed at 08/16/15 0853  Gross per 24 hour  Intake      0 ml  Output      1 ml  Net     -1 ml   General: resting in bed during our examination when left alone, easily agitated with stimulation HEENT: PERRL, EOMI, no scleral icterus Cardiac: tachycardic, systolic murmur heard today, no rubs, or gallops Pulm: clear to auscultation bilaterally, moving normal volumes of air Abd: soft, nondistended Ext: warm and well perfused, no pedal edema, moves all extremities Neuro: alternating somnolence and agitation, demonstrates 5/5 strength all extremities when resisting during exam  Assessment/Plan: Principal Problem:   Cerebrovascular accident (CVA) (HCC) Active Problems:   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 Has history of similar episode on prior admissions for CVA per husband. No obvious metabolic or toxic etiology to cause her symptoms. She does have bacteruria and incontinence which may indicate UTI as possible cause. She does have a history of depression and is on Escitalopram on home. Neurology has seen patient and do not think this is related to a CVA and have ordered an EEG for further evaluation and recommended psychiatric consultation. -Psychiatry consulted, appreciate recommendations -Continue home Escitalopram 10 mg po daily -Continue home Amitryptyline 25 mg po qhs prn -Bedside sitter -Consider Haldol if significant agitation -f/u EEG -f/u ABG -Monitor respiratory status and for aspiration -f/u blood and urine cultures  CVA: MRI brain on 08/15/2015 showed a small acute white matter infarct in the posterior right MCA. Neurology has reviewed imaging and reassessed patient today and do not believe this is an acute CVA, but an old infarct similarly seen on prior scans. They have recommended discontinuing stroke workup at this time. As above, neurology feels current symptoms are psychiatric in nature. -Appreciate Neurology's assistance -Further CVA workup cancelled per neuro -Continue home Asp 81 mg daily and Plavix 75 mg daily -Rosuvastatin 20 mg qd -f/u Hgb A1C -Lipid panel >> Total cholesterol 307, Triglycerides 193, HDL 37, LDL 231 -Continue risk factor modification with DM and HTN control   Bacteruria, incontinence: Urine study positive for nitrites and bacteruria. Patient with multiple episodes of incontinence.  She cannot verbalize other urinary symptoms to us. May be contributing to her agitation/AMS.  -f/u Urine culture -IV Ceftriaxone 2g  Diabetes Mellitus:  - f/u Hgb A1c. - Insulin detemir 25 units basal dosing with moderate SSI and HS  coverage.  HTN: -home lisinopril initially held for permissive hypertension, may restart if BPs elevated    Dispo: Disposition is deferred at this time, awaiting improvement of current medical problems.  Anticipated discharge in approximately 1-3 day(s).   The patient does have a current PCP Arvin Collard(John F. [name] II, MD) and [facility] after [facility].     LOS: 1 day   Darreld McleanVishal Aliyyah Riese, MD 08/16/2015, 1:40 PM

## 2015-08-16 NOTE — Progress Notes (Addendum)
  Nurse Cassandra paged me and notified me that Ms. Hadley had a normal neuro exam at 4am; she was alert and oriented at that time. At 6am she awoke crying inconsolably, speaking incomprehensibly, attempting to get out of bed and fervently asking the nurses to "please leave me alone." When I arrived to the room, she would respond intermittently to my questions with a "yes" to all answers but was not following commands. She was moving all extremities without an obvious facial droop but I was not able to perform a full neurologic exam because she was so uncooperative.  I suspect this is primarily delirium precipitated by her acute right posterior frontal coronary radiata stroke yesterday, but I cannot confidently rule out hemorrhagic conversion or a new stroke because I cannot perform a neurologic exam. I've asked nursing to continue trying to calm her and see if they can elucidate a rational reason for her delirium upon further questioning. If she is still confused in the next 30 minutes, I think we should get a non-contrast CT of her head to rule out hemorrhagic conversion. We will likely need lorazepam to facilitate the imaging.  Addendum: Nurse Cassandra called me back that she started vomiting and her right and left leg are shaking. We'll get a non-contrast head CT to rule out hemorrhage. Should that be normal, I think we should call neurology for a stat consultation to consider giving tPa if she does indeed have new neurologic deficits. An EEG may be prudent because this could very well be an ischemia-induced partial seizure.

## 2015-08-16 NOTE — Progress Notes (Addendum)
PT/OT Cancellation Note  Patient Details Name: Kelly Romero MRN: [id number] DOB: 04/17/1965   Cancelled Treatment:    Reason Eval/Treat Not Completed: Medical issues which prohibited therapy; patient with AMS, vomiting, unable to perform repeat CT to r/o hemorrhage.  Will attempt to see tomorrow.    Elray McgregorCynthia Wynn 08/16/2015, 11:00 AM  Sheran Lawlessyndi Wynn, PT [phone] 08/16/2015  08/16/2015 Martie RoundJulie Tayja Manzer, OTR/L Pager: [phone]

## 2015-08-16 NOTE — Progress Notes (Signed)
EEG Completed; Results Pending  

## 2015-08-16 NOTE — Consult Note (Signed)
[facility] Consult   Reason for Consult:  Depression and stress induced neurological symptoms Referring Physician:  Dr. Servando Snare Patient Identification: Kelly Romero MRN:  [id number] Principal Diagnosis: Cerebrovascular accident (CVA) [facility]) Diagnosis:   Patient Active Problem List   Diagnosis Date Noted  . Acute headache [R51] 08/15/2015  . CVA (cerebrovascular accident due to intracerebral hemorrhage) ([place]) [I61.9] 08/15/2015  . Cerebrovascular accident (CVA) ([place]) [I63.9]   . Encephalopathy, metabolic [K16.01] 09/32/3557  . Uncontrolled type 2 diabetes mellitus with diabetic polyneuropathy ([place]) [E11.42, E11.65] 12/28/2014  . Essential hypertension [I10] 12/28/2014  . New onset headache [R51] 11/02/2014    Total Time spent with patient: 1 hour  Subjective:   Kelly Romero is a 50 y.o. female patient admitted with depression and anxiety.  HPI:  Kelly Romero is a 50 year old female with PMH of HTN, T2DM, HLD, depression, and CVA who presents with dizziness, headache, slurred speech, left arm weakness. Patient seen, chart reviewed for the test for psychiatric consultation and evaluation of increased symptoms of depression, anxiety and neurological symptoms without neuromuscular focus. Patient was not able to contribute for this a history and evaluation. Information for this evaluation of pain from the patient husband was at bedside. Patient was not able to contribute because she just completed her EEG and has been sleeping since returned to her room. Patient husband stated he and other staff member could not wake up. Patient is a Pharmacist, hospital at [employer] and suddenly develop slurred speech, left arm weakness and right facial numbness which required this hospitalization. Reportedly she has it to previous episodes like this which were last about 3-4 days. Patient has been reported no stress and factors for her but she worried about financial and pain pills.  Patient husband has been diagnosed with a COPD and has been on disability. 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 Psychiatric consultation will follow-up with the patient when she was able to participate appropriately for the evaluation.   Review the following information for more details: Patient had 2 similar episodes worked up in the last year, which were more severe symptom-wise per family. In September 2016 she was seen at [facility] for slurred speech and right arm weakness and suspected to have a TIA after workup. TTE at that time reported a PFO. In December 2016, she was seen at [facility] with headache and slurred speech and found to have a left occipital subacute infarct on MRI of the brain. TTE at that time did not mention a PFO. She was seen by Dr. Tomi Likens of Neurology on follow up on 04/15/2015 and scheduled for TEE. This was completed on 04/19/2015 which showed an EF of 60-65%, but no PFO.  In the ED, she was hypertensive at 206/86. CT head was negative for acute changes. MRI brain was performed and showed a small acute white matter infarct in the posterior right MCA territory. Neurology was consulted in the ED. Per ED note, "Dr. Silverio Decamp looks at images he thinks it's similar to Feb 2017 and thinks it's not a CVA. Recommends full MRI w/ and w/o contrast and admission to medicine for BP/DM control."  Past Psychiatric History: No known history of mental health treatment.  Risk to Self: Is patient at risk for suicide?: No Risk to Others:   Prior Inpatient Therapy:   Prior Outpatient Therapy:    Past Medical History:  Past Medical History  Diagnosis Date  . Diabetes mellitus  without complication ([place])   . Headache   . Hypertension   . Hyperlipidemia     Past Surgical History  Procedure Laterality Date  . Cholecystectomy    . Hernia repair    . Appendectomy    . Foot ganglion excision    . Vaginal hysterectomy    .  Tee without cardioversion N/A 04/19/2015    Procedure: TRANSESOPHAGEAL ECHOCARDIOGRAM (TEE);  Surgeon: Jerline Pain, MD;  Location: [facility] ENDOSCOPY;  Service: Cardiovascular;  Laterality: N/A;   Family History:  Family History  Problem Relation Age of Onset  . COPD Mother   . Stroke Father     73s  . Heart failure Brother   . Cancer Maternal Grandmother     unknown   . COPD    . Heart failure Maternal Grandfather   . Hypertension Maternal Grandfather   . Cancer Paternal Grandfather     lung  . Diabetes Paternal Grandmother   . Heart attack Father 25   Family Psychiatric  History: Patient has no known history of psychiatric family history  Social History:  History  Alcohol Use No    Comment: rare      History  Drug Use No    Social History   Social History  . Marital Status: Married    Spouse Name: N/A  . Number of Children: N/A  . Years of Education: N/A   Occupational History  . Building control surveyor    Social History Main Topics  . Smoking status: Never Smoker   . Smokeless tobacco: Never Used  . Alcohol Use: No     Comment: rare   . Drug Use: No  . Sexual Activity: No   Other Topics Concern  . None   Social History Narrative   Additional Social History:    Allergies:   Allergies  Allergen Reactions  . Erythromycin Itching    Labs:  Results for orders placed or performed during the hospital encounter of 08/15/15 (from the past 48 hour(s))  Ethanol     Status: None   Collection Time: 08/15/15  8:20 AM  Result Value Ref Range   Alcohol, Ethyl (B) <5 <5 mg/dL    Comment:        LOWEST DETECTABLE LIMIT FOR SERUM ALCOHOL IS 5 mg/dL FOR MEDICAL PURPOSES ONLY   Protime-INR     Status: None   Collection Time: 08/15/15  8:20 AM  Result Value Ref Range   Prothrombin Time 14.1 11.6 - 15.2 seconds   INR 1.07 0.00 - 1.49  APTT     Status: None   Collection Time: 08/15/15  8:20 AM  Result Value Ref Range   aPTT 28 24 - 37 seconds  CBC     Status: None    Collection Time: 08/15/15  8:20 AM  Result Value Ref Range   WBC 6.0 4.0 - 10.5 K/uL   RBC 4.57 3.87 - 5.11 MIL/uL   Hemoglobin 12.4 12.0 - 15.0 g/dL   HCT 38.2 36.0 - 46.0 %   MCV 83.6 78.0 - 100.0 fL   MCH 27.1 26.0 - 34.0 pg   MCHC 32.5 30.0 - 36.0 g/dL   RDW 12.9 11.5 - 15.5 %   Platelets 221 150 - 400 K/uL  Differential     Status: None   Collection Time: 08/15/15  8:20 AM  Result Value Ref Range   Neutrophils Relative % 65 %   Neutro Abs 4.0 1.7 - 7.7 K/uL   Lymphocytes Relative  27 %   Lymphs Abs 1.6 0.7 - 4.0 K/uL   Monocytes Relative 6 %   Monocytes Absolute 0.3 0.1 - 1.0 K/uL   Eosinophils Relative 2 %   Eosinophils Absolute 0.1 0.0 - 0.7 K/uL   Basophils Relative 0 %   Basophils Absolute 0.0 0.0 - 0.1 K/uL  Comprehensive metabolic panel     Status: Abnormal   Collection Time: 08/15/15  8:20 AM  Result Value Ref Range   Sodium 132 (L) 135 - 145 mmol/L   Potassium 4.0 3.5 - 5.1 mmol/L   Chloride 98 (L) 101 - 111 mmol/L   CO2 28 22 - 32 mmol/L   Glucose, Bld 370 (H) 65 - 99 mg/dL   BUN 19 6 - 20 mg/dL   Creatinine, Ser 0.86 0.44 - 1.00 mg/dL   Calcium 9.4 8.9 - 10.3 mg/dL   Total Protein 6.7 6.5 - 8.1 g/dL   Albumin 3.1 (L) 3.5 - 5.0 g/dL   AST 17 15 - 41 U/L   ALT 15 14 - 54 U/L   Alkaline Phosphatase 91 38 - 126 U/L   Total Bilirubin 0.7 0.3 - 1.2 mg/dL   GFR calc non Af Amer >60 >60 mL/min   GFR calc Af Amer >60 >60 mL/min    Comment: (NOTE) The eGFR has been calculated using the CKD EPI equation. This calculation has not been validated in all clinical situations. eGFR's persistently <60 mL/min signify possible Chronic Kidney Disease.    Anion gap 6 5 - 15  I-stat troponin, ED (not at [facility], [facility])     Status: None   Collection Time: 08/15/15  8:39 AM  Result Value Ref Range   Troponin i, poc 0.01 0.00 - 0.08 ng/mL   Comment 3            Comment: Due to the release kinetics of cTnI, a negative result within the first hours of the onset of symptoms does  not rule out myocardial infarction with certainty. If myocardial infarction is still suspected, repeat the test at appropriate intervals.   I-Stat Chem 8, ED  (not at [facility], [facility])     Status: Abnormal   Collection Time: 08/15/15  8:41 AM  Result Value Ref Range   Sodium 135 135 - 145 mmol/L   Potassium 4.1 3.5 - 5.1 mmol/L   Chloride 95 (L) 101 - 111 mmol/L   BUN 23 (H) 6 - 20 mg/dL   Creatinine, Ser 0.80 0.44 - 1.00 mg/dL   Glucose, Bld 369 (H) 65 - 99 mg/dL   Calcium, Ion 1.19 1.12 - 1.23 mmol/L   TCO2 31 0 - 100 mmol/L   Hemoglobin 12.9 12.0 - 15.0 g/dL   HCT 38.0 36.0 - 46.0 %  Urine rapid drug screen (hosp performed)not at [facility]     Status: None   Collection Time: 08/15/15  8:52 AM  Result Value Ref Range   Opiates NONE DETECTED NONE DETECTED   Cocaine NONE DETECTED NONE DETECTED   Benzodiazepines NONE DETECTED NONE DETECTED   Amphetamines NONE DETECTED NONE DETECTED   Tetrahydrocannabinol NONE DETECTED NONE DETECTED   Barbiturates NONE DETECTED NONE DETECTED    Comment:        DRUG SCREEN FOR MEDICAL PURPOSES ONLY.  IF CONFIRMATION IS NEEDED FOR ANY PURPOSE, NOTIFY LAB WITHIN 5 DAYS.        LOWEST DETECTABLE LIMITS FOR URINE DRUG SCREEN Drug Class       Cutoff (ng/mL) Amphetamine  1000 Barbiturate      200 Benzodiazepine   112 Tricyclics       162 Opiates          300 Cocaine          300 THC              50   Urinalysis, Routine w reflex microscopic (not at [facility])     Status: Abnormal   Collection Time: 08/15/15  8:52 AM  Result Value Ref Range   Color, Urine YELLOW YELLOW   APPearance CLOUDY (A) CLEAR   Specific Gravity, Urine 1.017 1.005 - 1.030   pH 7.0 5.0 - 8.0   Glucose, UA >1000 (A) NEGATIVE mg/dL   Hgb urine dipstick MODERATE (A) NEGATIVE   Bilirubin Urine NEGATIVE NEGATIVE   Ketones, ur NEGATIVE NEGATIVE mg/dL   Protein, ur 100 (A) NEGATIVE mg/dL   Nitrite POSITIVE (A) NEGATIVE   Leukocytes, UA NEGATIVE NEGATIVE  Urine microscopic-add on      Status: Abnormal   Collection Time: 08/15/15  8:52 AM  Result Value Ref Range   Squamous Epithelial / LPF 0-5 (A) NONE SEEN   WBC, UA 0-5 0 - 5 WBC/hpf   RBC / HPF 6-30 0 - 5 RBC/hpf   Bacteria, UA MANY (A) NONE SEEN   Casts HYALINE CASTS (A) NEGATIVE   Urine-Other YEAST PRESENT   CBG monitoring, ED     Status: Abnormal   Collection Time: 08/15/15  3:16 PM  Result Value Ref Range   Glucose-Capillary 160 (H) 65 - 99 mg/dL  Glucose, capillary     Status: Abnormal   Collection Time: 08/15/15 10:34 PM  Result Value Ref Range   Glucose-Capillary 225 (H) 65 - 99 mg/dL  Lipid panel     Status: Abnormal   Collection Time: 08/16/15  5:05 AM  Result Value Ref Range   Cholesterol 307 (H) 0 - 200 mg/dL   Triglycerides 193 (H) <150 mg/dL   HDL 37 (L) >40 mg/dL   Total CHOL/HDL Ratio 8.3 RATIO   VLDL 39 0 - 40 mg/dL   LDL Cholesterol 231 (H) 0 - 99 mg/dL    Comment:        Total Cholesterol/HDL:CHD Risk Coronary Heart Disease Risk Table                     Men   Women  1/2 Average Risk   3.4   3.3  Average Risk       5.0   4.4  2 X Average Risk   9.6   7.1  3 X Average Risk  23.4   11.0        Use the calculated Patient Ratio above and the CHD Risk Table to determine the patient's CHD Risk.        ATP III CLASSIFICATION (LDL):  <100     mg/dL   Optimal  100-129  mg/dL   Near or Above                    Optimal  130-159  mg/dL   Borderline  160-189  mg/dL   High  >190     mg/dL   Very High   Glucose, capillary     Status: Abnormal   Collection Time: 08/16/15  6:53 AM  Result Value Ref Range   Glucose-Capillary 180 (H) 65 - 99 mg/dL  Comprehensive metabolic panel     Status: Abnormal   Collection  Time: 08/16/15  7:53 AM  Result Value Ref Range   Sodium 136 135 - 145 mmol/L   Potassium 3.6 3.5 - 5.1 mmol/L   Chloride 99 (L) 101 - 111 mmol/L   CO2 29 22 - 32 mmol/L   Glucose, Bld 226 (H) 65 - 99 mg/dL   BUN 18 6 - 20 mg/dL   Creatinine, Ser 0.88 0.44 - 1.00 mg/dL   Calcium  9.3 8.9 - 10.3 mg/dL   Total Protein 6.7 6.5 - 8.1 g/dL   Albumin 2.9 (L) 3.5 - 5.0 g/dL   AST 20 15 - 41 U/L   ALT 16 14 - 54 U/L   Alkaline Phosphatase 96 38 - 126 U/L   Total Bilirubin 0.7 0.3 - 1.2 mg/dL   GFR calc non Af Amer >60 >60 mL/min   GFR calc Af Amer >60 >60 mL/min    Comment: (NOTE) The eGFR has been calculated using the CKD EPI equation. This calculation has not been validated in all clinical situations. eGFR's persistently <60 mL/min signify possible Chronic Kidney Disease.    Anion gap 8 5 - 15  CBC     Status: Abnormal   Collection Time: 08/16/15  7:53 AM  Result Value Ref Range   WBC 11.7 (H) 4.0 - 10.5 K/uL   RBC 4.78 3.87 - 5.11 MIL/uL   Hemoglobin 12.9 12.0 - 15.0 g/dL   HCT 40.3 36.0 - 46.0 %   MCV 84.3 78.0 - 100.0 fL   MCH 27.0 26.0 - 34.0 pg   MCHC 32.0 30.0 - 36.0 g/dL   RDW 13.2 11.5 - 15.5 %   Platelets 246 150 - 400 K/uL  Glucose, capillary     Status: Abnormal   Collection Time: 08/16/15  8:39 AM  Result Value Ref Range   Glucose-Capillary 244 (H) 65 - 99 mg/dL    Current Facility-Administered Medications  Medication Dose Route Frequency Provider Last Rate Last Dose  . amitriptyline (ELAVIL) tablet 25 mg  25 mg Oral QHS PRN Zada Finders, MD      . aspirin EC tablet 81 mg  81 mg Oral Daily Zada Finders, MD   81 mg at 08/16/15 0925  . cefTRIAXone (ROCEPHIN) 2 g in dextrose 5 % 50 mL IVPB  2 g Intravenous Once Zada Finders, MD      . clopidogrel (PLAVIX) tablet 75 mg  75 mg Oral Daily Zada Finders, MD   75 mg at 08/16/15 0926  . enoxaparin (LOVENOX) injection 40 mg  40 mg Subcutaneous Q24H Veronda P Bryk, RPH      . escitalopram (LEXAPRO) tablet 10 mg  10 mg Oral Daily Zada Finders, MD   10 mg at 08/15/15 2244  . insulin aspart (novoLOG) injection 0-15 Units  0-15 Units Subcutaneous TID WC Zada Finders, MD   5 Units at 08/16/15 0924  . insulin aspart (novoLOG) injection 0-5 Units  0-5 Units Subcutaneous QHS Zada Finders, MD   2 Units at 08/15/15  2243  . insulin detemir (LEVEMIR) injection 25 Units  25 Units Subcutaneous QHS Zada Finders, MD   25 Units at 08/15/15 2243  . liver oil-zinc oxide (DESITIN) 40 % ointment   Topical q morning - 10a Loleta Chance, MD   1 application at 39/03/00 [phone]  . LORazepam (ATIVAN) injection 1 mg  1 mg Intravenous PRN Sherwood Gambler, MD   1 mg at 08/15/15 1732  . rosuvastatin (CRESTOR) tablet 20 mg  20 mg Oral QHS Zada Finders, MD  20 mg at 08/15/15 2244    Musculoskeletal: Strength & Muscle Tone: decreased Gait & Station: unable to stand Patient leans: N/A  Psychiatric Specialty Exam: Physical Exam as per history and physical   Review of Systems  Unable to perform ROS   Blood pressure 155/80, pulse 111, temperature 99.7 F (37.6 C), temperature source Axillary, resp. rate 18, height 5' 4" (1.626 m), weight 102.7 kg (226 lb 6.6 oz), SpO2 95 %.Body mass index is 38.84 kg/(m^2).  General Appearance: Guarded  Eye Contact:  NA  Speech:  NA  Volume:  na  Mood:  NA  Affect:  NA  Thought Process:  NA  Orientation:  NA  Thought Content:  NA  Suicidal Thoughts:   unknown  Homicidal Thoughts:  Unknown  Memory:  NA  Judgement:  NA  Insight:  NA  Psychomotor Activity:  NA  Concentration:  unknown  Recall:  NA  Fund of Knowledge:  NA  Language:  NA  Akathisia:  NA  Handed:  Right  AIMS (if indicated):     Assets:  Others:  Will be determined  ADL's:  Impaired  Cognition:  Impaired,  Mild  Sleep:        Treatment Plan Summary: Reviewed chart, interviewed the patient husband who is at bedside as patient was unable to wake up from her sleep and reportedly returned to her room 5 minutes ago after procedure. Reportedly patient family and staff RN unable to wake her up in her room even though she woke up briefly on the way to her room.   Patient will be followed by the psychiatric consultation for further evaluation when it is appropriate and she is able to contribute. So please call the  psychiatric consultation when patient is able to stay awake and contribute either during this weekend or this provider will follow-up with her Monday   Reportedly patient has no safety concerns.   Recommended no psychiatric medication at this time.  Daily contact with patient to assess and evaluate symptoms and progress in treatment and Medication management  Disposition: Supportive therapy provided about ongoing stressors. Will be reassessed when appropriate  and able to participate the psychiatric evaluation   Ambrose Finland, MD 08/16/2015 12:25 PM

## 2015-08-16 NOTE — Progress Notes (Signed)
Subjective: Around 6 am for her neuro exam check patient began to cry inconsolably, speak incomprehensibly, and resisted examination.  She was not willing to follow commands but was moving all of her extremities.  Patient later started vomiting and developed bilateral LE tremors.  She went to radiology for a non contrast head CT to rule out a bleed but became delirious, prompting a rapid response.  She was sleeping calmly in bed when we arrived but wouldn't follow commands.  Patient was breathing comfortably and resisting examination with appropriate strength in her extremities.  During our examination, patient was still not following commands but did fluently verbalize a need to go to the bathroom after an episode of urinary incontinence.    Objective: Vital signs in last 24 hours: Filed Vitals:   08/15/15 2300 08/16/15 0514 08/16/15 0839 08/16/15 1146  BP:  137/61 130/111 155/80  Pulse:  88 110 111  Temp:  99 F (37.2 C)  99.7 F (37.6 C)  TempSrc:  Oral  Axillary  Resp:  16  18  Height: 5\' 4"  (1.626 m)     Weight: 102.7 kg (226 lb 6.6 oz)     SpO2:  91% 95% 95%   Physical Exam: General: somnolent, unable to follow commands.  NAD HEENT: Pupils equal, round, and reactive to light.  MMM CV:  Regular rhythm, tachycardic.  No murmurs appreciated.  Normal S1 and S2 Lungs: Normal work of breathing.  CTA bilaterally, no crackles or wheezes Ext:  No peripheral edema Neuro:  Unable to perform a full neuro exam 2/2 patient resistance.  Appropriate strength during resistance.  Able to speak fluently, though didn't follow commands.  Moves all extremities.  Patellar reflexes intact.   Psych: delerius and agitated.    Lab Results:  Recent Labs Lab 08/15/15 0820 08/15/15 0841 08/16/15 0753  WBC 6.0  --  11.7*  HGB 12.4 12.9 12.9  HCT 38.2 38.0 40.3  PLT 221  --  246    Recent Labs Lab 08/15/15 0820 08/15/15 0841 08/16/15 0753  NA 132* 135 136  K 4.0 4.1 3.6  CL 98* 95* 99*  CO2  28  --  29  BUN 19 23* 18  CREATININE 0.86 0.80 0.88  CALCIUM 9.4  --  9.3  PROT 6.7  --  6.7  BILITOT 0.7  --  0.7  ALKPHOS 91  --  96  ALT 15  --  16  AST 17  --  20  GLUCOSE 370* 369* 226*    Studies/Results: Ct Head Wo Contrast  08/15/2015  CLINICAL DATA:  Patient with headache, dizziness and stumbling. Slurred speech. Right-sided weakness. EXAM: CT HEAD WITHOUT CONTRAST TECHNIQUE: Contiguous axial images were obtained from the base of the skull through the vertex without intravenous contrast. COMPARISON:  Brain CT 05/13/2015. FINDINGS: Ventricles and sulci are appropriate for patient's age. Periventricular and subcortical white matter hypodensity compatible with chronic microvascular ischemic changes. 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 No evidence for acute cortically based infarct, intracranial hemorrhage, mass lesion or mass-effect. Calvarium is intact. Paranasal sinuses are well aerated. Mastoid air cells unremarkable. IMPRESSION: No acute intracranial process. Chronic microvascular ischemic changes. Critical Value/emergent results were called by telephone at the time of interpretation on 08/15/2015 at 8:54 am to Dr. Lavon PaganiniNandigam, who verbally acknowledged these results. Electronically Signed   By: Annia Beltrew  Davis M.D.   On: 08/15/2015 08:56   Mr Laqueta JeanBrain W EAWo Contrast  08/15/2015  CLINICAL DATA:  50 y.o. female presenting in hypertensive urgency, with headache, and LEFT arm weakness. Stroke Risk Factors - diabetes mellitus, hyperlipidemia and hypertension EXAM: MRI HEAD WITHOUT AND WITH CONTRAST TECHNIQUE: Multiplanar, multiecho pulse sequences of the brain and surrounding structures were obtained without and with intravenous contrast. CONTRAST:  MultiHance 20 mL. COMPARISON:  Diffusion-weighted imaging earlier today. FINDINGS: Significant motion degradation. Small or subtle lesions could be overlooked. Redemonstrated is a small  acute nonhemorrhagic deep white matter infarct, RIGHT posterior frontal lobe. No other similar abnormalities. No progression from earlier. Premature for age global atrophy. Moderately extensive T2 and FLAIR hyperintensities throughout the white matter, likely chronic microvascular ischemic change. Moderate-sized remote deep white matter lacunar infarct adjacent to the RIGHT frontal horn. This shows sequelae chronic hemorrhage. Flow voids are maintained. Post infusion, no definite abnormal enhancement of the brain or meninges, given the non orthogonal positioning and motion degradation. Extracranial soft tissues grossly unremarkable. 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 This is nonhemorrhagic, and stable from earlier today. Motion degraded examination demonstrating no associated hemorrhage, or associated postcontrast enhancement. Electronically Signed   By: Elsie Stain M.D.   On: 08/15/2015 19:52   Mr Brain Ltd W/o Cm  08/15/2015  CLINICAL DATA:  50 year old female with acute onset slurred speech and headache upon waking today. Limited diffusion only Brain MRI requested by Neurology. EXAM: LIMITED MRI HEAD WITHOUT CONTRAST TECHNIQUE: Multiplanar, multiecho pulse sequences of the brain and surrounding structures were obtained without intravenous contrast. COMPARISON:  Head CT without contrast 0827 hours today. [facility] Brain MRI 05/13/2015. [facility] brain MRI 02/22/2015. FINDINGS: Axial in coronal diffusion weighted imaging only as requested by Neurology. Small 8-9 mm focus of restricted diffusion involving the right corona radiata and some subcortical white matter in the posterior right superior frontal lobe (series 3, image 36 and series 4, image 14). This appears to be separate from a small linear area of chronic T2 and FLAIR white matter signal abnormality here seen on the prior studies. No evidence of associated mass effect. No  contralateral or other restricted diffusion. No ventriculomegaly. Stable cerebral volume. Facilitated diffusion in the Areas of white matter adjacent to the right frontal horn and caudate nucleus as well as in the bilateral periatrial white matter, which appears to correspond to chronic small vessel ischemia. IMPRESSION: 1. 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 2. Evidence of underlying chronic small vessel disease as seen on the 05/13/2015 comparison. Electronically Signed   By: Odessa Fleming M.D.   On: 08/15/2015 10:38   Medications: I have reviewed the patient's current medications. Scheduled Meds: . aspirin EC  81 mg Oral Daily  . cefTRIAXone (ROCEPHIN)  IV  2 g Intravenous Once  . clopidogrel  75 mg Oral Daily  . enoxaparin (LOVENOX) injection  40 mg Subcutaneous Q24H  . escitalopram  10 mg Oral Daily  . insulin aspart  0-15 Units Subcutaneous TID WC  . insulin aspart  0-5 Units Subcutaneous QHS  . insulin detemir  25 Units Subcutaneous QHS  . liver oil-zinc oxide   Topical q morning - 10a  . rosuvastatin  20 mg Oral QHS   Continuous Infusions:  PRN Meds:.amitriptyline, LORazepam Assessment/Plan: Principal Problem:   Cerebrovascular accident (CVA) (HCC) Active Problems:   Uncontrolled type 2 diabetes mellitus with diabetic polyneuropathy (HCC)   Essential hypertension   Acute headache   CVA (cerebrovascular accident due to intracerebral hemorrhage) (  HCC)  Ms. Schecter is a 50 year-old female with a history of hypertension, uncontrolled diabetes mellitus, hyperlipidemia, and two previous stroke like episodes who presented with hypertensive urgency, headache, dizziness, and left arm weakness but later developed altered mental status.  # Altered mental status:  Has had 2 previous similar episodes of AMS during admission for focal neurologic complaints and hypertensive urgency.  MRI on arrival significant for a small acute white matter infarct in the posterior  right MCA distribution and evidence of underlying chronic small vessel disease as seen on previous MRI. Neurology believes this is not a CVA and thewhite matter infarct is chronic when compared to previous MRI in Feb 2017. Her weakness, slurred speech, and dizziness improved while in the ED, though she was still having some trouble forming words. On 6/2 she developed AMS as described above.  Neuro was again consulted and felt this was likely due to an underlying psychiatric illness because of her previous episodes and recommended Psych consult.    - Consult psychiatry, recs appreciated - EEG per neuro  - ABG and CBC/BMP to evaluate for metabolic or hypoxic causes of AMS - Cancelled further CVA workup (echo, carotid doppler, PT/OT/Speech consults) - Continue home Asp 81 mg daily and Plavix 75 mg daily - Rosuvastatin 20 mg BID  # Hypertensive urgency: Systolic BPs up to 227. Given labetalol 10 mg IV in the ED and pressure has been in the 140-160s systolic since then.  -Hold home lisinopril for permissive hypertension  # Headache: Likely 2/2 hypertensive urgency. History of similar intermittent headaches. Takes amitryptiline 25 mg qhs and ibuprofen 800 mg at home for this. Given toradol 15 mg injection and baclofen 10 mg PO in the ED without resolution.  - Ibuprofen and amitryptyline as needed  #UTI: U/A on admission showed >1000 glucose, positive for protein and nitrites, but negative for LE.  Microscopic exam showed many bacteria with hyaline casts.  Unlikely to be a contributing factor to her AMS without systemic signs of infection such as a fever or leukocytosis. - Urine culture - Ceftriaxone 2g IV  # Diabetes Mellitus: Blood glucose was 370 on arrival to the ED. Hgb A1c pending - f/u Hgb A1c. - Changed detemir to 25 units basal dosing with moderate SSI.   # Hyperlipidemia: Takes atorvastatin 80 mg BID and rosuvastatin 10 mg qhs at home.  Lipid panel significant for LDL 231, HDL 37,  Cholesterol 307.  - Rosuvastatin as above   Diet: normal diet  Prophylaxis: Lovenox  Code: Full  This is a Psychologist, occupational Note.  The care of the patient was discussed with Dr. Darreld Mclean and the assessment and plan formulated with their assistance.  Please see their attached note for official documentation of the daily encounter.   LOS: 1 day   Jolinda Croak, Med Student 08/16/2015, 12:36 PM

## 2015-08-16 NOTE — Progress Notes (Signed)
RT Note: AM RT left about 1520 this afternoon and I took over the unit 5W from her at that time. Mrs. Leifheit had an order for an ABG from 0745 am which was ordered prior to my taking over the assignment. I was not notified that there was an order for an ABG nor that it was still pending when given report nor communicated anything about the patient. As soon as I saw the order and confirmed with the RN that it still needed to be done, I attempted to obtain the lab. I stuck the patient once and was unsuccessful so I called and requested another RT come to attempt to do the ABG. ABG is still pending presently but it will be collected. Patient did not appear to be in any acute respiratory distress when I attempted to obtain the ABG but she is a little difficult to arouse and her husband who is at bedside says that she has been like that all day. RT will continue to monitor and will obtain the ABG.

## 2015-08-16 NOTE — Progress Notes (Signed)
SLP Cancellation Note  Patient Details Name: Kelly Romero MRN: [id number] DOB: 07/28/1965   Cancelled treatment:       Reason Eval/Treat Not Completed: Patient's level of consciousness. Pt is not able to participate in swallow eval at this time due to AMS. RN can page me if mentation improves later today.  Harlon DittyBonnie Rhina Kramme, [place] CCC-SLP [phone]    Claudine MoutonDeBlois, Shey Yott Caroline 08/16/2015, 8:59 AM

## 2015-08-16 NOTE — Care Management Note (Addendum)
Case Management Note  Patient Details  Name: Haskel Khanamela R Gamel MRN: [id number] Date of Birth: 08/27/1965  Subjective/Objective:                 Spoke with patient's husband in the room while she was down at EEG. This is the third episode of these symptoms the patient has had. Patient continues to have slurred speech. Husband denies a psychiatric history. He states that she has regained all functionality with each episode prior to hospital discharge. He states that he has not witnessed any episodes as they ave occurred at work. He states that they have walker wc etc at home from patient taking care of her mother when prior to her death. Patient is still employed, drives, and independent.  MRI: Small acute deep white matter infarct RIGHT posterior frontal coronal radiata. This is nonhemorrhagic, and stable from earlier today.   Action/Plan:  CM will continue to follow for DC planning.  Expected Discharge Date:                  Expected Discharge Plan:  Home w Home Health Services  In-House Referral:     Discharge planning Services  CM Consult  Post Acute Care Choice:    Choice offered to:     DME Arranged:    DME Agency:     HH Arranged:    HH Agency:     Status of Service:  In process, will continue to follow  Medicare Important Message Given:    Date Medicare IM Given:    Medicare IM give by:    Date Additional Medicare IM Given:    Additional Medicare Important Message give by:     If discussed at Long Length of Stay Meetings, dates discussed:    Additional Comments:  Lawerance SabalDebbie Hebe Merriwether, RN 08/16/2015, 2:23 PM

## 2015-08-16 NOTE — Progress Notes (Signed)
  Echocardiogram 2D Echocardiogram has been performed.  Kelly Romero, Kelly Romero 08/16/2015, 11:50 AM

## 2015-08-16 NOTE — Progress Notes (Signed)
Paged Teaching service multiple times to inquire about NIH stroke scale orders. No response. Will continue to follow orders, no new orders at this time.

## 2015-08-16 NOTE — Progress Notes (Signed)
Patient's bed alarm was going off. Upon arrival to the patient's room patient was disoriented, jumping out of bed, unable to follow commands, yelling incomprehensibly. Occasionally she would yell "leave me alone" and then "help me" while crying and grabbing my hand. She refused to stay still, constantly moving from bed to chair. RN attempted a neuro check but pt was unable to follow commands, pupils were equal but non-reactive to light. RN paged MD to come assess patient due to the rapid onset of mental status change.  After MD visited pt, she continued this behavior to the point of almost falling. Once RN was able to get patient safely seated, pt began vomiting. Rapid Response was then paged to come assess patient. MD was also paged to be made aware of new symptoms.

## 2015-08-16 NOTE — Consult Note (Signed)
HPI:                                                                                                                                         Kelly Romero is an 50 y.o. female patient admitted with headache and slurred speech symptoms as described in the consultation note. Patient reportedly has been very agitated today, not cooperative with nursing care. Her husband reports that she had similar episodes of agitation and previous hospitalizations. She does have chronic headaches on a daily basis as per husband. It is unclear if she was using any medications for headaches, and possible withdrawal from these are triggered his mental status changes. Urine toxicology screen has been negative.  Repeat MRI of the brain with contrast was performed which did not show any symptom change compared to yesterday's MRI. 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   Past Medical History: Past Medical History  Diagnosis Date  . Diabetes mellitus without complication (HCC)   . Headache   . Hypertension   . Hyperlipidemia     Past Surgical History  Procedure Laterality Date  . Cholecystectomy    . Hernia repair    . Appendectomy    . Foot ganglion excision    . Vaginal hysterectomy    . Tee without cardioversion N/A 04/19/2015    Procedure: TRANSESOPHAGEAL ECHOCARDIOGRAM (TEE);  Surgeon: Jake BatheMark C Skains, MD;  [facility];  Service: Cardiovascular;  Laterality: N/A;    Family History: Family History  Problem Relation Age of Onset  . COPD Mother   . Stroke Father     3850s  . Heart failure Brother   . Cancer Maternal Grandmother     unknown   . COPD    . Heart failure Maternal Grandfather   . Hypertension Maternal Grandfather   . Cancer Paternal Grandfather     lung   . Diabetes Paternal Grandmother   . Heart attack Father [id number]    Social History:   reports that she has never smoked. She has never used smokeless tobacco. She reports that she does not drink alcohol or use illicit drugs.  Allergies:  Allergies  Allergen Reactions  . Erythromycin Itching     Medications:  Current facility-administered medications:  .  amitriptyline (ELAVIL) tablet 25 mg, 25 mg, Oral, QHS PRN, Darreld Mclean, MD .  aspirin EC tablet 81 mg, 81 mg, Oral, Daily, Darreld Mclean, MD, 81 mg at 08/16/15 0925 .  clopidogrel (PLAVIX) tablet 75 mg, 75 mg, Oral, Daily, Darreld Mclean, MD, 75 mg at 08/16/15 0926 .  enoxaparin (LOVENOX) injection 40 mg, 40 mg, Subcutaneous, Q24H, Veronda P Bryk, RPH .  escitalopram (LEXAPRO) tablet 10 mg, 10 mg, Oral, Daily, Darreld Mclean, MD, 10 mg at 08/15/15 2244 .  insulin aspart (novoLOG) injection 0-15 Units, 0-15 Units, Subcutaneous, TID WC, Darreld Mclean, MD, 5 Units at 08/16/15 0924 .  insulin aspart (novoLOG) injection 0-5 Units, 0-5 Units, Subcutaneous, QHS, Darreld Mclean, MD, 2 Units at 08/15/15 2243 .  insulin detemir (LEVEMIR) injection 25 Units, 25 Units, Subcutaneous, QHS, Darreld Mclean, MD, 25 Units at 08/15/15 2243 .  liver oil-zinc oxide (DESITIN) 40 % ointment, , Topical, q morning - 10a, Selina Cooley, MD, 1 application at 08/16/15 [phone] .  LORazepam (ATIVAN) injection 1 mg, 1 mg, Intravenous, PRN, Pricilla Loveless, MD, 1 mg at 08/15/15 1732 .  rosuvastatin (CRESTOR) tablet 20 mg, 20 mg, Oral, QHS, Darreld Mclean, MD, 20 mg at 08/15/15 2244  Neurologic Examination:                                                                                                    Today's Vitals   08/15/15 2300 08/16/15 0514 08/16/15 0815 08/16/15 0839  BP:  137/61  130/111  Pulse:  88  110  Temp:  99 F (37.2 C)    TempSrc:  Oral    Resp:   16    Height:  (1.626 m)     Weight: 102.7 kg (226 lb 6.6 oz)     SpO2:  91%  95%  PainSc:   Asleep    Patient is very drowsy from medication given to her due to agitation. Not cooperative with exam.    Lab Results: Basic Metabolic Panel:  Recent Labs Lab 08/15/15 0820 08/15/15 0841 08/16/15 0753  NA 132* 135 136  K 4.0 4.1 3.6  CL 98* 95* 99*  CO2 28  --  29  GLUCOSE 370* 369* 226*  BUN 19 23* 18  CREATININE 0.86 0.80 0.88  CALCIUM 9.4  --  9.3    Liver Function Tests:  Recent Labs Lab 08/15/15 0820 08/16/15 0753  AST 17 20  ALT 15 16  ALKPHOS 91 96  BILITOT 0.7 0.7  PROT 6.7 6.7  ALBUMIN 3.1* 2.9*   No results for input(s): LIPASE, AMYLASE in the last 168 hours. No results for input(s): AMMONIA in the last 168 hours.  CBC:  Recent Labs Lab 08/15/15 0820 08/15/15 0841 08/16/15 0753  WBC 6.0  --  11.7*  NEUTROABS 4.0  --   --   HGB 12.4 12.9 12.9  HCT 38.2 38.0 40.3  MCV 83.6  --  84.3  PLT 221  --  246    Cardiac Enzymes: No results for input(s): CKTOTAL, CKMB, CKMBINDEX, TROPONINI in  the last 168 hours.  Lipid Panel:  Recent Labs Lab 08/16/15 0505  CHOL 307*  TRIG 193*  HDL 37*  CHOLHDL 8.3  VLDL 39  LDLCALC 308*    CBG:  Recent Labs Lab 08/15/15 1516 08/15/15 2234 08/16/15 0653 08/16/15 0839  GLUCAP 160* 225* 180* 244*    Microbiology: Results for orders placed or performed in visit on 10/29/13  Urine culture     Status: None   Collection Time: 10/29/13  6:01 PM  Result Value Ref Range Status   Micro Text Report   Final       SOURCE: CLEAN CATCH    ORGANISM 1                >100,000 CFU/ML ESCHERICHIA COLI   COMMENT                   WITH MIXED-BACTERIAL-ORGANISMS   ANTIBIOTIC                    ORG#1     AMPICILLIN                    S         CEFAZOLIN                     S         CEFOXITIN                     S         CEFTRIAXONE                   S         CIPROFLOXACIN                 S          GENTAMICIN                    S         IMIPENEM                      S         LEVOFLOXACIN                  S         NITROFURANTOIN                S         TRIMETHOPRIM/SULFAMETHOXAZOLE S              Imaging: Ct Head Wo Contrast  08/15/2015  CLINICAL DATA:  Patient with headache, dizziness and stumbling. Slurred speech. Right-sided weakness. EXAM: CT HEAD WITHOUT CONTRAST TECHNIQUE: Contiguous axial images were obtained from the base of the skull through the vertex without intravenous contrast. COMPARISON:  Brain CT 05/13/2015. FINDINGS: Ventricles and sulci are appropriate for patient's age. Periventricular and subcortical white matter hypodensity compatible with chronic microvascular ischemic changes. 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 No evidence for acute cortically based infarct, intracranial hemorrhage, mass lesion or mass-effect. Calvarium is intact. Paranasal sinuses are well aerated. Mastoid air cells unremarkable. IMPRESSION: No acute intracranial process. Chronic microvascular ischemic changes. Critical Value/emergent results were called by telephone at the time of interpretation on 08/15/2015 at 8:54 am to Dr. Lavon Paganini, who verbally acknowledged these results. Electronically Signed  By: Annia Belt M.D.   On: 08/15/2015 08:56   Mr Laqueta Jean WJ Contrast  08/15/2015  CLINICAL DATA:  50 y.o. female presenting in hypertensive urgency, with headache, and LEFT arm weakness. Stroke Risk Factors - diabetes mellitus, hyperlipidemia and hypertension EXAM: MRI HEAD WITHOUT AND WITH CONTRAST TECHNIQUE: Multiplanar, multiecho pulse sequences of the brain and surrounding structures were obtained without and with intravenous contrast. CONTRAST:  MultiHance 20 mL. COMPARISON:  Diffusion-weighted imaging earlier today. FINDINGS: Significant motion degradation. Small or subtle lesions could be overlooked. Redemonstrated is a small  acute nonhemorrhagic deep white matter infarct, RIGHT posterior frontal lobe. No other similar abnormalities. No progression from earlier. Premature for age global atrophy. Moderately extensive T2 and FLAIR hyperintensities throughout the white matter, likely chronic microvascular ischemic change. Moderate-sized remote deep white matter lacunar infarct adjacent to the RIGHT frontal horn. This shows sequelae chronic hemorrhage. Flow voids are maintained. Post infusion, no definite abnormal enhancement of the brain or meninges, given the non orthogonal positioning and motion degradation. Extracranial soft tissues grossly unremarkable. 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 This is nonhemorrhagic, and stable from earlier today. Motion degraded examination demonstrating no associated hemorrhage, or associated postcontrast enhancement. Electronically Signed   By: Elsie Stain M.D.   On: 08/15/2015 19:52   Mr Brain Ltd W/o Cm  08/15/2015  CLINICAL DATA:  50 year old female with acute onset slurred speech and headache upon waking today. Limited diffusion only Brain MRI requested by Neurology. EXAM: LIMITED MRI HEAD WITHOUT CONTRAST TECHNIQUE: Multiplanar, multiecho pulse sequences of the brain and surrounding structures were obtained without intravenous contrast. COMPARISON:  Head CT without contrast 0827 hours today. [facility] Brain MRI 05/13/2015. [facility] brain [facility] 02/22/2015. FINDINGS: Axial in coronal diffusion weighted imaging only as requested by Neurology. Small 8-9 mm focus of restricted diffusion involving the right corona radiata and some subcortical white matter in the posterior right superior frontal lobe (series 3, image 36 and series 4, image 14). This appears to be separate from a small linear area of chronic T2 and FLAIR white matter signal abnormality here seen on the prior studies. No evidence of associated mass effect. No  contralateral or other restricted diffusion. No ventriculomegaly. Stable cerebral volume. Facilitated diffusion in the Areas of white matter adjacent to the right frontal horn and caudate nucleus as well as in the bilateral periatrial white matter, which appears to correspond to chronic small vessel ischemia. IMPRESSION: 1. 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 2. Evidence of underlying chronic small vessel disease as seen on the 05/13/2015 comparison. Electronically Signed   By: Odessa Fleming M.D.   On: 08/15/2015 10:38    Assessment and plan:   Kelly Romero is an 50 y.o. female patient admitted with headache and slurred speech symptoms as described in the consultation note. Patient reportedly has been very agitated today, not cooperative with nursing care. Her husband reports that she had similar episodes of agitation and previous hospitalizations. She does have chronic headaches on a daily basis as per husband. It is unclear if she was using any medications for headaches, and possible withdrawal from these are triggered his mental status changes. Urine toxicology screen has been negative.  Repeat MRI of the brain with contrast was performed which did not show any symptom change compared to yesterday's MRI. 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  Psychiatry service is consulted to further help with her agitation symptoms, possible acute psychosis as she had similar symptoms of psychosis in the past with hospitalization was reported by husband. Defer management to psychiatry.  We will continue to follow-up to monitor her clinical course.

## 2015-08-17 ENCOUNTER — Encounter: Payer: Self-pay | Admitting: Oncology

## 2015-08-17 DIAGNOSIS — G43709 Chronic migraine without aura, not intractable, without status migrainosus: Secondary | ICD-10-CM

## 2015-08-17 DIAGNOSIS — N39498 Other specified urinary incontinence: Secondary | ICD-10-CM | POA: Diagnosis not present

## 2015-08-17 DIAGNOSIS — F418 Other specified anxiety disorders: Secondary | ICD-10-CM | POA: Diagnosis not present

## 2015-08-17 DIAGNOSIS — R4182 Altered mental status, unspecified: Secondary | ICD-10-CM

## 2015-08-17 DIAGNOSIS — M62838 Other muscle spasm: Secondary | ICD-10-CM

## 2015-08-17 DIAGNOSIS — Z8673 Personal history of transient ischemic attack (TIA), and cerebral infarction without residual deficits: Secondary | ICD-10-CM | POA: Diagnosis not present

## 2015-08-17 DIAGNOSIS — G9340 Encephalopathy, unspecified: Secondary | ICD-10-CM | POA: Diagnosis present

## 2015-08-17 DIAGNOSIS — E1165 Type 2 diabetes mellitus with hyperglycemia: Secondary | ICD-10-CM | POA: Diagnosis not present

## 2015-08-17 LAB — CBC
HCT: 40 % (ref 36.0–46.0)
HEMOGLOBIN: 12.8 g/dL (ref 12.0–15.0)
MCH: 26.6 pg (ref 26.0–34.0)
MCHC: 32 g/dL (ref 30.0–36.0)
MCV: 83.2 fL (ref 78.0–100.0)
Platelets: 232 10*3/uL (ref 150–400)
RBC: 4.81 MIL/uL (ref 3.87–5.11)
RDW: 13.2 % (ref 11.5–15.5)
WBC: 7.9 10*3/uL (ref 4.0–10.5)

## 2015-08-17 LAB — COMPREHENSIVE METABOLIC PANEL
ALBUMIN: 2.6 g/dL — AB (ref 3.5–5.0)
ALK PHOS: 87 U/L (ref 38–126)
ALT: 15 U/L (ref 14–54)
ANION GAP: 11 (ref 5–15)
AST: 19 U/L (ref 15–41)
BUN: 16 mg/dL (ref 6–20)
CALCIUM: 9.2 mg/dL (ref 8.9–10.3)
CO2: 27 mmol/L (ref 22–32)
Chloride: 97 mmol/L — ABNORMAL LOW (ref 101–111)
Creatinine, Ser: 0.89 mg/dL (ref 0.44–1.00)
GFR calc Af Amer: >60 mL/min (ref >60)
GLUCOSE: 244 mg/dL — AB (ref 65–99)
POTASSIUM: 3.2 mmol/L — AB (ref 3.5–5.1)
Sodium: 135 mmol/L (ref 135–145)
TOTAL PROTEIN: 6.2 g/dL — AB (ref 6.5–8.1)
Total Bilirubin: 0.7 mg/dL (ref 0.3–1.2)

## 2015-08-17 LAB — GLUCOSE, CAPILLARY
GLUCOSE-CAPILLARY: 236 mg/dL — AB (ref 65–99)
GLUCOSE-CAPILLARY: 290 mg/dL — AB (ref 65–99)
GLUCOSE-CAPILLARY: 154 mg/dL — AB (ref 65–99)
Glucose-Capillary: 250 mg/dL — ABNORMAL HIGH (ref 65–99)

## 2015-08-17 LAB — HEMOGLOBIN A1C
Hgb A1c MFr Bld: 10.5 % — ABNORMAL HIGH (ref 4.8–5.6)
Mean Plasma Glucose: 255 mg/dL

## 2015-08-17 MED ORDER — VALPROATE SODIUM 500 MG/5ML IV SOLN
250.0000 mg | Freq: Four times a day (QID) | INTRAVENOUS | Status: DC
  Administered 2015-08-17 – 2015-08-18 (×4): 250 mg via INTRAVENOUS
  Filled 2015-08-17 (×5): qty 2.5

## 2015-08-17 MED ORDER — HALOPERIDOL LACTATE 5 MG/ML IJ SOLN
2.0000 mg | Freq: Once | INTRAMUSCULAR | Status: AC
  Administered 2015-08-17: 2 mg via INTRAVENOUS
  Filled 2015-08-17: qty 1

## 2015-08-17 MED ORDER — BACLOFEN 10 MG PO TABS
5.0000 mg | ORAL_TABLET | Freq: Three times a day (TID) | ORAL | Status: DC
  Administered 2015-08-17 – 2015-08-19 (×6): 5 mg via ORAL
  Filled 2015-08-17 (×6): qty 1

## 2015-08-17 MED ORDER — INSULIN DETEMIR 100 UNIT/ML ~~LOC~~ SOLN
30.0000 [IU] | Freq: Every day | SUBCUTANEOUS | Status: DC
  Administered 2015-08-17: 30 [IU] via SUBCUTANEOUS
  Filled 2015-08-17 (×2): qty 0.3

## 2015-08-17 MED ORDER — IBUPROFEN 400 MG PO TABS
800.0000 mg | ORAL_TABLET | Freq: Once | ORAL | Status: AC
  Administered 2015-08-17: 800 mg via ORAL
  Filled 2015-08-17: qty 2

## 2015-08-17 MED ORDER — LEVETIRACETAM 500 MG PO TABS: 500.0000 mg | ORAL_TABLET | Freq: Two times a day (BID) | ORAL | Status: DC

## 2015-08-17 MED ORDER — AMITRIPTYLINE HCL 50 MG PO TABS
25.0000 mg | ORAL_TABLET | Freq: Every day | ORAL | Status: DC
  Administered 2015-08-17 – 2015-08-18 (×2): 25 mg via ORAL
  Filled 2015-08-17 (×2): qty 1

## 2015-08-17 NOTE — Progress Notes (Signed)
Interval History:                                                                                                                      Kelly Romero is an 50 y.o. female patient with  altered mental status, appears to have improved today. She had recurrent bouts of such altered mental status with agitation associated with it, typically last 1-2 days and then she recovers gradually on her own. She had multiple such episodes in the past as reported by husband. The etiology of these episodes of acute encephalopathy are unknown at this time. Patient reports having chronic daily headaches for which she takes ibuprofen 1600 mg on a daily basis. Urine drug screen has been negative.  EEG done yesterday showed evidence of mild to moderate encephalopathy, no seizures or abnormal discharges noted.   Primary team felt that her presentation of altered mental status could represent seizures and was empirically started on Keppra 500 twice a day.  MRI of the brain showed a tiny area of restricted diffusion in the right right white matter. But the similar restricted diffusion was seen previous MRI from February 2017, and hence I don't think this lesion represents an acute infarct. This is most likely an area of susceptibility signal. 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          Past Medical History: Past Medical History  Diagnosis Date  . Diabetes mellitus without complication (HCC)   . Headache   . Hypertension   . Hyperlipidemia     Past Surgical History  Procedure Laterality Date  . Cholecystectomy    . Hernia repair    . Appendectomy    . Foot ganglion excision    . Vaginal hysterectomy    . Tee without cardioversion N/A 04/19/2015    Procedure: TRANSESOPHAGEAL ECHOCARDIOGRAM (TEE);  Surgeon: Jake Bathe, MD;  Location: [facility] ENDOSCOPY;  Service: Cardiovascular;  Laterality: N/A;    Family History: Family History  Problem Relation Age of Onset    . COPD Mother   . Stroke Father     75s  . Heart failure Brother   . Cancer Maternal Grandmother     unknown   . COPD    . Heart failure Maternal Grandfather   . Hypertension Maternal Grandfather   . Cancer Paternal Grandfather     lung  . Diabetes Paternal Grandmother   . Heart attack Father 31    Social History:   reports that she has never smoked. She has never used smokeless tobacco. She reports that she does not drink alcohol or use illicit drugs.  Allergies:  Allergies  Allergen Reactions  . Erythromycin Itching     Medications:  Current facility-administered medications:  .  amitriptyline (ELAVIL) tablet 25 mg, 25 mg, Oral, QHS, Rushil Patel V, MD .  baclofen (LIORESAL) tablet 5 mg, 5 mg, Oral, TID, Izumi Mixon Daniel Nones, MD .  clopidogrel (PLAVIX) tablet 75 mg, 75 mg, Oral, Daily, Darreld Mclean, MD, 75 mg at 08/17/15 1100 .  enoxaparin (LOVENOX) injection 40 mg, 40 mg, Subcutaneous, Q24H, Veronda P Bryk, RPH, 40 mg at 08/17/15 1100 .  escitalopram (LEXAPRO) tablet 10 mg, 10 mg, Oral, Daily, Darreld Mclean, MD, 10 mg at 08/17/15 1100 .  insulin aspart (novoLOG) injection 0-15 Units, 0-15 Units, Subcutaneous, TID WC, Darreld Mclean, MD, 8 Units at 08/17/15 1751 .  insulin aspart (novoLOG) injection 0-5 Units, 0-5 Units, Subcutaneous, QHS, Darreld Mclean, MD, 2 Units at 08/16/15 2200 .  insulin detemir (LEVEMIR) injection 30 Units, 30 Units, Subcutaneous, QHS, Rushil Patel V, MD .  liver oil-zinc oxide (DESITIN) 40 % ointment, , Topical, q morning - 10a, Selina Cooley, MD, 1 application at 08/16/15 [phone] .  LORazepam (ATIVAN) injection 1 mg, 1 mg, Intravenous, PRN, Pricilla Loveless, MD, 1 mg at 08/15/15 1732 .  rosuvastatin (CRESTOR) tablet 20 mg, 20 mg, Oral, QHS, Darreld Mclean, MD, 20 mg at 08/15/15 2244 .  valproate (DEPACON) 250 mg in dextrose 5 % 50 mL  IVPB, 250 mg, Intravenous, Q6H, Christee Mervine Daniel Nones, MD   Neurologic Examination:                                                                                                     Today's Vitals   08/17/15 0537 08/17/15 0548 08/17/15 0704 08/17/15 1436  BP: 161/135  117/76 139/66  Pulse: 111  101 89  Temp:  99.3 F (37.4 C)  98.7 F (37.1 C)  TempSrc:  Oral  Oral  Resp:    17  Height:      Weight:      SpO2: 96%   100%  PainSc:        Evaluation of higher integrative functions including: Level of alertness: Alert, Calm, cooperative today Oriented to time, place and person Speech: fluent, no evidence of dysarthria or aphasia noted.  Test the following cranial nerves: 2-12 grossly intact Motor examination: Normal tone, bulk, full 5/5 motor strength in all 4 extremities Test coordination: Normal finger nose testing, with no evidence of limb appendicular ataxia or abnormal involuntary movements or tremors noted.   Lab Results: Basic Metabolic Panel:  Recent Labs Lab 08/15/15 0820 08/15/15 0841 08/16/15 0753 08/17/15 0603  NA 132* 135 136 135  K 4.0 4.1 3.6 3.2*  CL 98* 95* 99* 97*  CO2 28  --  29 27  GLUCOSE 370* 369* 226* 244*  BUN 19 23* 18 16  CREATININE 0.86 0.80 0.88 0.89  CALCIUM 9.4  --  9.3 9.2    Liver Function Tests:  Recent Labs Lab 08/15/15 0820 08/16/15 0753 08/17/15 0603  AST 17 20 19   ALT 15 16 15   ALKPHOS 91 96 87  BILITOT 0.7 0.7 0.7  PROT 6.7 6.7 6.2*  ALBUMIN 3.1* 2.9* 2.6*  No results for input(s): LIPASE, AMYLASE in the last 168 hours. No results for input(s): AMMONIA in the last 168 hours.  CBC:  Recent Labs Lab 08/15/15 0820 08/15/15 0841 08/16/15 0753 08/17/15 0603  WBC 6.0  --  11.7* 7.9  NEUTROABS 4.0  --   --   --   HGB 12.4 12.9 12.9 12.8  HCT 38.2 38.0 40.3 40.0  MCV 83.6  --  84.3 83.2  PLT 221  --  246 232    Cardiac Enzymes: No results for input(s): CKTOTAL, CKMB, CKMBINDEX, TROPONINI in the last  168 hours.  Lipid Panel:  Recent Labs Lab 08/16/15 0505  CHOL 307*  TRIG 193*  HDL 37*  CHOLHDL 8.3  VLDL 39  LDLCALC 161231*    CBG:  Recent Labs Lab 08/16/15 1729 08/16/15 2145 08/17/15 0808 08/17/15 1248 08/17/15 1716  GLUCAP 219* 207* 236* 250* 290*    Microbiology: Results for orders placed or performed during the hospital encounter of 08/15/15  Culture, blood (routine x 2)     Status: None (Preliminary result)   Collection Time: 08/16/15  8:15 AM  Result Value Ref Range Status   Specimen Description BLOOD RIGHT HAND  Final   Special Requests IN PEDIATRIC BOTTLE 4CC  Final   Culture NO GROWTH < 24 HOURS  Final   Report Status PENDING  Incomplete  Culture, blood (routine x 2)     Status: None (Preliminary result)   Collection Time: 08/16/15  8:20 AM  Result Value Ref Range Status   Specimen Description BLOOD RIGHT ANTECUBITAL  Final   Special Requests IN PEDIATRIC BOTTLE .5CC  Final   Culture NO GROWTH < 24 HOURS  Final   Report Status PENDING  Incomplete    Imaging: Ct Head Wo Contrast  08/15/2015  CLINICAL DATA:  Patient with headache, dizziness and stumbling. Slurred speech. Right-sided weakness. EXAM: CT HEAD WITHOUT CONTRAST TECHNIQUE: Contiguous axial images were obtained from the base of the skull through the vertex without intravenous contrast. COMPARISON:  Brain CT 05/13/2015. FINDINGS: Ventricles and sulci are appropriate for patient's age. Periventricular and subcortical white matter hypodensity compatible with chronic microvascular ischemic changes. 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 No evidence for acute cortically based infarct, intracranial hemorrhage, mass lesion or mass-effect. Calvarium is intact. Paranasal sinuses are well aerated. Mastoid air cells unremarkable. IMPRESSION: No acute intracranial process. Chronic microvascular ischemic changes. Critical Value/emergent results  were called by telephone at the time of interpretation on 08/15/2015 at 8:54 am to Dr. Lavon PaganiniNandigam, who verbally acknowledged these results. Electronically Signed   By: Annia Beltrew  Davis M.D.   On: 08/15/2015 08:56   Mr Laqueta JeanBrain W WRWo Contrast  08/15/2015  CLINICAL DATA:  50 y.o. female presenting in hypertensive urgency, with headache, and LEFT arm weakness. Stroke Risk Factors - diabetes mellitus, hyperlipidemia and hypertension EXAM: MRI HEAD WITHOUT AND WITH CONTRAST TECHNIQUE: Multiplanar, multiecho pulse sequences of the brain and surrounding structures were obtained without and with intravenous contrast. CONTRAST:  MultiHance 20 mL. COMPARISON:  Diffusion-weighted imaging earlier today. FINDINGS: Significant motion degradation. Small or subtle lesions could be overlooked. Redemonstrated is a small acute nonhemorrhagic deep white matter infarct, RIGHT posterior frontal lobe. No other similar abnormalities. No progression from earlier. Premature for age global atrophy. Moderately extensive T2 and FLAIR hyperintensities throughout the white matter, likely chronic microvascular ischemic change. Moderate-sized remote deep white matter lacunar infarct adjacent to the RIGHT frontal horn. This shows  sequelae chronic hemorrhage. Flow voids are maintained. Post infusion, no definite abnormal enhancement of the brain or meninges, given the non orthogonal positioning and motion degradation. Extracranial soft tissues grossly unremarkable. 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 This is nonhemorrhagic, and stable from earlier today. Motion degraded examination demonstrating no associated hemorrhage, or associated postcontrast enhancement. Electronically Signed   By: Elsie Stain M.D.   On: 08/15/2015 19:52   Mr Brain Ltd W/o Cm  08/15/2015  CLINICAL DATA:  50 year old female with acute onset slurred speech and headache upon waking today. Limited diffusion only Brain MRI requested by  Neurology. EXAM: LIMITED MRI HEAD WITHOUT CONTRAST TECHNIQUE: Multiplanar, multiecho pulse sequences of the brain and surrounding structures were obtained without intravenous contrast. COMPARISON:  Head CT without contrast 0827 hours today. [facility] Brain MRI 05/13/2015. High [facility] brain MRI 02/22/2015. FINDINGS: Axial in coronal diffusion weighted imaging only as requested by Neurology. Small 8-9 mm focus of restricted diffusion involving the right corona radiata and some subcortical white matter in the posterior right superior frontal lobe (series 3, image 36 and series 4, image 14). This appears to be separate from a small linear area of chronic T2 and FLAIR white matter signal abnormality here seen on the prior studies. No evidence of associated mass effect. No contralateral or other restricted diffusion. No ventriculomegaly. Stable cerebral volume. Facilitated diffusion in the Areas of white matter adjacent to the right frontal horn and caudate nucleus as well as in the bilateral periatrial white matter, which appears to correspond to chronic small vessel ischemia. IMPRESSION: 1. 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 2. Evidence of underlying chronic small vessel disease as seen on the 05/13/2015 comparison. Electronically Signed   By: Odessa Fleming M.D.   On: 08/15/2015 10:38    Assessment and plan:  Kelly Romero is an 50 y.o. female patient with  altered mental status, appears to have improved today. She had recurrent bouts of such altered mental status with agitation associated with it, typically last 1-2 days and then she recovers gradually on her own. She had multiple such episodes in the past as reported by husband. The etiology of these episodes of acute encephalopathy are unknown at this time. Patient reports having chronic daily headaches for which she takes ibuprofen 1600 mg on a daily basis. Urine drug screen has been negative.  EEG  done yesterday showed evidence of mild to moderate encephalopathy, no seizures or abnormal discharges noted.   Primary team felt that her presentation of altered mental status could represent seizures and was empirically started on Keppra 500 twice a day. MRI of the brain showed a tiny area of restricted diffusion in the right right white matter. But the similar restricted diffusion was seen previous MRI from February 2017, and hence I don't think this lesion represents an acute infarct. This is most likely an area of susceptibility signal. 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  I do not have strong evidence at this point to suggest seizures or underlying epilepsy. Hence do not recommend continuing Keppra at this time.  She does have chronic daily headaches with migraines and may benefit from using a migraine prophylactic medication. Discussed about various available medications with the patient. Based on our discussion she is agreeable to start Depakote for migraine prophylaxis. Discussed about need for continued monitoring of the liver function test, platelets, and to follow-up  with outpatient neurology for dose titration. Discussed about common side effects including weight gain secondary to increased appetite. If she has any side effects or symptoms weight gain and no future, she is advised to switch to different reflecting medication to her follow-up with outpatient neurology. For now we'll start with IV Depacon 250 mg Q6H while she is in the hospital as she's been having severe migraine headache at this time. If her headache improves in the next 24 hours, we'll switch to by mouth dose , anticipating discharge. Also recommend starting baclofen 5 mg TID for chronic neck pain and spasms.  Discontinue keppra. She is on Plavix currently. Echocardiogram showed an EF of 65%. Physical, occupational therapy follow-up.

## 2015-08-17 NOTE — Procedures (Signed)
History: Kelly Romero is an 50 y.o. female patient with altered mental status. Routine inpatient EEG was performed for further evaluation.   Patient Active Problem List   Diagnosis Date Noted  . Acute encephalopathy 08/17/2015  . Anxiety and depression   . CVA (cerebrovascular accident due to intracerebral hemorrhage) (HCC) 08/15/2015  . Encephalopathy, metabolic 12/28/2014  . 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 Essential hypertension 12/28/2014  . New onset headache 11/02/2014     Current facility-administered medications:  .  amitriptyline (ELAVIL) tablet 25 mg, 25 mg, Oral, QHS, Rushil Patel V, MD, 25 mg at 08/17/15 2150 .  baclofen (LIORESAL) tablet 5 mg, 5 mg, Oral, TID, Merril Isakson Daniel NonesNarayan Kaveer Xzaria Teo, MD, 5 mg at 08/17/15 2152 .  clopidogrel (PLAVIX) tablet 75 mg, 75 mg, Oral, Daily, Darreld McleanVishal Patel, MD, 75 mg at 08/17/15 1100 .  enoxaparin (LOVENOX) injection 40 mg, 40 mg, Subcutaneous, Q24H, Veronda P Bryk, RPH, 40 mg at 08/17/15 1100 .  escitalopram (LEXAPRO) tablet 10 mg, 10 mg, Oral, Daily, Darreld McleanVishal Patel, MD, 10 mg at 08/17/15 1100 .  insulin aspart (novoLOG) injection 0-15 Units, 0-15 Units, Subcutaneous, TID WC, Darreld McleanVishal Patel, MD, 8 Units at 08/17/15 1751 .  insulin aspart (novoLOG) injection 0-5 Units, 0-5 Units, Subcutaneous, QHS, Darreld McleanVishal Patel, MD, 2 Units at 08/16/15 2200 .  insulin detemir (LEVEMIR) injection 30 Units, 30 Units, Subcutaneous, QHS, Rushil Terrilee CroakPatel V, MD, 30 Units at 08/17/15 2201 .  liver oil-zinc oxide (DESITIN) 40 % ointment, , Topical, q morning - 10a, Selina CooleyKyle Flores, MD, 1 application at 08/17/15 1000 .  LORazepam (ATIVAN) injection 1 mg, 1 mg, Intravenous, PRN, Pricilla LovelessScott Goldston, MD, 1 mg at 08/15/15 1732 .  rosuvastatin (CRESTOR) tablet 20 mg, 20 mg, Oral, QHS, Darreld McleanVishal Patel, MD, 20 mg at 08/17/15 2150 .  valproate (DEPACON) 250 mg in dextrose 5 % 50 mL IVPB, 250 mg, Intravenous, Q6H, Saifullah Jolley Daniel NonesNarayan Kaveer Marguriete Wootan,  MD, 250 mg at 08/17/15 2145   Introduction:  This is a 19 channel routine scalp EEG performed at the bedside with bipolar and monopolar montages arranged in accordance to the international 10/20 system of electrode placement. One channel was dedicated to EKG recording.   Findings:  Mild generalized background slowing mostly in the range of 7 Hz noted, with intermittent generalized delta activity. No definite evidence of abnormal epileptiform discharges or electrographic seizures were noted during this recording.   Impression:  Abnormal awake and drowsy routine inpatient EEG suggestive of mild to moderate encephalopathy as described. Clinical correlation is recommended .

## 2015-08-17 NOTE — Evaluation (Signed)
Physical Therapy Evaluation Patient Details Name: Kelly Romero MRN: [id number] DOB: 1966/02/24 Today's Date: 08/17/2015   History of Present Illness  Ms. Kelly Romero is a 50 year-old female with a history of hypertension, uncontrolled diabetes mellitus, hyperlipidemia, and two previous stroke like episodes who presented with hypertensive urgency, headache, dizziness, and left arm weakness. Later developed agitation and altered mental status. Small acute deep white matter infarct RIGHT posterior frontal  Clinical Impression   Pt admitted with above diagnosis. Pt currently with functional limitations due to the deficits listed below (see PT Problem List).  Pt will benefit from skilled PT to increase their independence and safety with mobility to allow discharge to the venue listed below.       Follow Up Recommendations Home health PT;Supervision/Assistance - 24 hour (May progress well enough to not need HHPT)    Equipment Recommendations  Rolling walker with 5" wheels;3in1 (PT);Other (comment) (will monitor for progress; may not need these)    Recommendations for Other Services OT consult     Precautions / Restrictions Precautions Precautions: Fall      Mobility  Bed Mobility Overal bed mobility: Needs Assistance Bed Mobility: Supine to Sit     Supine to sit: Min guard     General bed mobility comments: Good use of rail to come to sit  Transfers Overall transfer level: Needs assistance Equipment used: Rolling walker (2 wheeled) Transfers: Sit to/from Stand Sit to Stand: Min assist         General transfer comment: Min assist to steady  Ambulation/Gait Ambulation/Gait assistance: Min assist Ambulation Distance (Feet):  (pivot steps bed to chair with RW) Assistive device: Rolling walker (2 wheeled) Gait Pattern/deviations: Shuffle     General Gait Details: Very sleepy, so sat to recliner earlier than anticipated; no knee buckling noted; did not walk enough to get  a good read on her gait pattern  Stairs            Wheelchair Mobility    Modified Rankin (Stroke Patients Only) Modified Rankin (Stroke Patients Only) Pre-Morbid Rankin Score: No symptoms Modified Rankin: Slight disability     Balance Overall balance assessment: Needs assistance           Standing balance-Leahy Scale: Poor                               Pertinent Vitals/Pain Pain Assessment: No/denies pain    Home Living Family/patient expects to be discharged to:: Private residence Living Arrangements: Spouse/significant other Available Help at Discharge: Family Type of Home: Mobile home Home Access: Stairs to enter Entrance Stairs-Rails: Right Entrance Stairs-Number of Steps: 4 Home Layout: One level Home Equipment: None      Prior Function Level of Independence: Independent         Comments: Very sleepy on eval; Will need to verify above information     Hand Dominance        Extremity/Trunk Assessment   Upper Extremity Assessment: Defer to OT evaluation           Lower Extremity Assessment: Generalized weakness (Difficult to get a good read, quite lethargic and unable to follow about half of commands)         Communication   Communication: Other (comment) (Sleepy; had recieved Haldol)  Cognition Arousal/Alertness: Lethargic;Suspect due to medications Behavior During Therapy: Flat affect Overall Cognitive Status: Difficult to assess  General Comments      Exercises        Assessment/Plan    PT Assessment Patient needs continued PT services  PT Diagnosis Difficulty walking;Generalized weakness   PT Problem List    PT Treatment Interventions DME instruction;Gait training;Stair training;Functional mobility training;Therapeutic activities;Therapeutic exercise;Balance training;Patient/family education   PT Goals (Current goals can be found in the Care Plan section) Acute Rehab PT  Goals Patient Stated Goal: did not state PT Goal Formulation: Patient unable to participate in goal setting Time For Goal Achievement: 08/31/15 Potential to Achieve Goals: Good    Frequency Min 4X/week   Barriers to discharge        Co-evaluation               End of Session Equipment Utilized During Treatment: Gait belt Activity Tolerance: Patient limited by lethargy Patient left: in chair;with call bell/phone within reach;with chair alarm set Nurse Communication: Mobility status    Functional Assessment Tool Used: Clinical Judgement Functional Limitation: Mobility: Walking and moving around Mobility: Walking and Moving Around Current Status [phone](G8978): At least 20 percent but less than 40 percent impaired, limited or restricted Mobility: Walking and Moving Around Goal Status [phone](G8979): 0 percent impaired, limited or restricted    Time: [id number] PT Time Calculation (min) (ACUTE ONLY): 13 min   Charges:   PT Evaluation $PT Eval Moderate Complexity: 1 Procedure     PT G Codes:   PT G-Codes **NOT FOR INPATIENT CLASS** Functional Assessment Tool Used: Clinical Judgement Functional Limitation: Mobility: Walking and moving around Mobility: Walking and Moving Around Current Status ([id number]): At least 20 percent but less than 40 percent impaired, limited or restricted Mobility: Walking and Moving Around Goal Status [phone](G8979): 0 percent impaired, limited or restricted    Van ClinesGarrigan, Maisey Deandrade Hamff 08/17/2015, 12:15 PM  Van ClinesHolly Jetty Berland, PT  Acute Rehabilitation Services Pager [phone] Office [phone]

## 2015-08-17 NOTE — Progress Notes (Addendum)
Subjective: Overnight, she required Haldol 5 mg IV 1 agitation.  This morning, her husband was at bedside. He feels her mental status is slowly improving. He feels the Foley may have provoked her some more yesterday. He denies similar episodes at home though they seem to happen when she is at work.  Objective: Vital signs in last 24 hours: Filed Vitals:   08/17/15 0537 08/17/15 0548 08/17/15 0704 08/17/15 1436  BP: 161/135  117/76 139/66  Pulse: 111  101 89  Temp:  99.3 F (37.4 C)  98.7 F (37.1 C)  TempSrc:  Oral  Oral  Resp:    17  Height:      Weight:      SpO2: 96%   100%   Weight change:   Intake/Output Summary (Last 24 hours) at 08/17/15 1525 Last data filed at 08/17/15 1330  Gross per 24 hour  Intake    480 ml  Output    420 ml  Net     60 ml   General: resting in bed, sleepy HEENT: PERRL, no scleral icterus Cardiac: tachycardic, systolic murmur heard today, no rubs, or gallops Pulm: clear to auscultation bilaterally in the anterior lung fields, moving normal volumes of air Abd: soft, nondistended Ext: warm and well perfused, no pedal edema, moves all extremities Neuro: oriented to name, place, year; moving all extremities spontaneously  Assessment/Plan: Principal Problem:   Cerebrovascular accident (CVA) (HCC) Active Problems:   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 Urinary incontinence could also be consistent with seizure-like activity though EEG is pending. Drug discontinuation syndrome is another possibility and that she has not had amitriptyline in 48 hours though she is on a low dose. No recommendations from psychiatry yesterday.  -Start Keppra  twice  daily -Discontinue Foley and reassess mental status -Continue home Escitalopram 10 mg po daily -Continue home Amitryptyline 25 mg po qhs -Continue Bedside sitter -Consider Haldol if significant agitation -F/u EEG, blood and urine cultures -Psychiatry consulted, appreciate recommendations  History of CVA: MRI brain on 08/15/2015 showed a small acute white matter infarct in the posterior right MCA. Neurology has reviewed imaging and reassessed patient today and do not believe this is an acute CVA, but an old infarct similarly seen on prior scans. They have recommended discontinuing stroke workup at this time. As above, neurology feels current symptoms are psychiatric in nature. Lipid panel notable for total cholesterol 307, Triglycerides 193, HDL 37, LDL 231.  -Continue home Asp 81 mg daily and Plavix 75 mg daily -Continue Rosuvastatin 20 mg qd -Appreciate Neurology's assistance  Diabetes Mellitus: A1c 10.5. CBGs trending 200s yesterday though oral intake poor. More alert this morning and able to eat. -Increase detemir 30 units basal dosing with moderate SSI and HS coverage.  HTN: Mostly normotensive off home lisinopril.   Dispo: Disposition is deferred at this time, awaiting improvement of current medical problems.  Anticipated discharge in approximately 1-3 day(s).   The patient does have a current PCP Arvin Collard II, MD) and does not need an [facility] hospital follow-up appointment after discharge.       [name], MD 08/17/2015, 3:25 PM Medicine attending: I examined this patient today and I concur with the evaluation and management plan  as recorded by resident physician Dr. Heywood Ilesushil Patel which we discussed together.

## 2015-08-17 NOTE — Progress Notes (Signed)
Pt suddenly became agitated and somewhat combative. Patient began pushing against the RN and forced herself to stand up and start walking, then pt had to quickly sit down in the chair. She then started pushing against 3 RN's and 1 nurse tech trying to get out of the chair stating that she had to go to the bathroom. RN explained that pt had a catheter in place and could go ahead and void. Pt persisted to go to the bathroom and continued to push against the staff. RN paged internal medicine teaching services for something to calm the pt. MD placed order for Haldol.

## 2015-08-17 NOTE — Progress Notes (Signed)
Subjective: Patient had another episode of agitation overnight that resolved with Haldol.  This morning she was much more alert and cooperative than yesterday.  Patient claims she is feeling better with her only complaint being a mild headache.  She denies weakness, slurred speech, nausea, vomiting, abdominal pain.  She was able to eat breakfast without any problems.    Objective: Vital signs in last 24 hours: Filed Vitals:   08/17/15 0014 08/17/15 0537 08/17/15 0548 08/17/15 0704  BP: 128/55 161/135  117/76  Pulse: 89 111  101  Temp:   99.3 F (37.4 C)   TempSrc:   Oral   Resp:      Height:      Weight:      SpO2: 96% 96%     Weight change:   Intake/Output Summary (Last 24 hours) at 08/17/15 0808 Last data filed at 08/17/15 0609  Gross per 24 hour  Intake      0 ml  Output    421 ml  Net   -421 ml   Physical Exam: General: NAD, lying comfortably in bed HEENT: Extraocular movements intact.  Pupils equal, round, and reactive to light.  MMM CV:  RRR without appreciable murmur.  Normal S1 S2 without extra heart sounds Lungs:  Normal work of breathing.  CTA bilaterally without crackles or wheezes Abd:  Soft, nontender, nondistended Ext: No peripheral edema Neuro:  Alert and oriented.  Able to follow commands.  Mildly sleepy.  Strength 5/5 throughout.  Normal sensation to light touch bilaterally.  Patellar reflexes 1+. Psych: flat affect  Lab Results:  Recent Labs Lab 08/15/15 0820 08/15/15 0841 08/16/15 0753 08/17/15 0603  WBC 6.0  --  11.7* 7.9  HGB 12.4 12.9 12.9 12.8  HCT 38.2 38.0 40.3 40.0  PLT 221  --  246 232    Recent Labs Lab 08/15/15 0820 08/15/15 0841 08/16/15 0753 08/17/15 0603  NA 132* 135 136 135  K 4.0 4.1 3.6 3.2*  CL 98* 95* 99* 97*  CO2 28  --  29 27  BUN 19 23* 18 16  CREATININE 0.86 0.80 0.88 0.89  CALCIUM 9.4  --  9.3 9.2  PROT 6.7  --  6.7 6.2*  BILITOT 0.7  --  0.7 0.7  ALKPHOS 91  --  96 87  ALT 15  --  16 15  AST 17  --  20 19    GLUCOSE 370* 369* 226* 244*   Arterial Blood Gas result:  pO2 72.2; pCO2 40.2; pH 7.45;  HCO3 27.6  Studies/Results: Ct Head Wo Contrast  08/15/2015  CLINICAL DATA:  Patient with headache, dizziness and stumbling. Slurred speech. Right-sided weakness. EXAM: CT HEAD WITHOUT CONTRAST TECHNIQUE: Contiguous axial images were obtained from the base of the skull through the vertex without intravenous contrast. COMPARISON:  Brain CT 05/13/2015. FINDINGS: Ventricles and sulci are appropriate for patient's age. Periventricular and subcortical white matter hypodensity compatible with chronic microvascular ischemic changes. 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 No evidence for acute cortically based infarct, intracranial hemorrhage, mass lesion or mass-effect. Calvarium is intact. Paranasal sinuses are well aerated. Mastoid air cells unremarkable. IMPRESSION: No acute intracranial process. Chronic microvascular ischemic changes. Critical Value/emergent results were called by telephone at the time of interpretation on 08/15/2015 at 8:54 am to Dr. Lavon Paganini, who verbally acknowledged these results. Electronically Signed   By: Annia Belt M.D.   On: 08/15/2015 08:56   Mr  Brain W Wo Contrast  08/15/2015  CLINICAL DATA:  50 y.o. female presenting in hypertensive urgency, with headache, and LEFT arm weakness. Stroke Risk Factors - diabetes mellitus, hyperlipidemia and hypertension EXAM: MRI HEAD WITHOUT AND WITH CONTRAST TECHNIQUE: Multiplanar, multiecho pulse sequences of the brain and surrounding structures were obtained without and with intravenous contrast. CONTRAST:  MultiHance 20 mL. COMPARISON:  Diffusion-weighted imaging earlier today. FINDINGS: Significant motion degradation. Small or subtle lesions could be overlooked. Redemonstrated is a small acute nonhemorrhagic deep white matter infarct, RIGHT posterior frontal lobe. No other similar  abnormalities. No progression from earlier. Premature for age global atrophy. Moderately extensive T2 and FLAIR hyperintensities throughout the white matter, likely chronic microvascular ischemic change. Moderate-sized remote deep white matter lacunar infarct adjacent to the RIGHT frontal horn. This shows sequelae chronic hemorrhage. Flow voids are maintained. Post infusion, no definite abnormal enhancement of the brain or meninges, given the non orthogonal positioning and motion degradation. Extracranial soft tissues grossly unremarkable. 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 This is nonhemorrhagic, and stable from earlier today. Motion degraded examination demonstrating no associated hemorrhage, or associated postcontrast enhancement. Electronically Signed   By: Elsie Stain M.D.   On: 08/15/2015 19:52   Mr Brain Ltd W/o Cm  08/15/2015  CLINICAL DATA:  50 year old female with acute onset slurred speech and headache upon waking today. Limited diffusion only Brain MRI requested by Neurology. EXAM: LIMITED MRI HEAD WITHOUT CONTRAST TECHNIQUE: Multiplanar, multiecho pulse sequences of the brain and surrounding structures were obtained without intravenous contrast. COMPARISON:  Head CT without contrast 0827 hours today. [facility] Brain MRI 05/13/2015. [employer] [facility] brain MRI 02/22/2015. FINDINGS: Axial in coronal diffusion weighted imaging only as requested by Neurology. Small 8-9 mm focus of restricted diffusion involving the right corona radiata and some subcortical white matter in the posterior right superior frontal lobe (series 3, image 36 and series 4, image 14). This appears to be separate from a small linear area of chronic T2 and FLAIR white matter signal abnormality here seen on the prior studies. No evidence of associated mass effect. No contralateral or other restricted diffusion. No ventriculomegaly. Stable cerebral volume.  Facilitated diffusion in the Areas of white matter adjacent to the right frontal horn and caudate nucleus as well as in the bilateral periatrial white matter, which appears to correspond to chronic small vessel ischemia. IMPRESSION: 1. 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 2. Evidence of underlying chronic small vessel disease as seen on the 05/13/2015 comparison. Electronically Signed   By: Odessa Fleming M.D.   On: 08/15/2015 10:38   Medications: I have reviewed the patient's current medications. Scheduled Meds: . aspirin EC  81 mg Oral Daily  . clopidogrel  75 mg Oral Daily  . enoxaparin (LOVENOX) injection  40 mg Subcutaneous Q24H  . escitalopram  10 mg Oral Daily  . insulin aspart  0-15 Units Subcutaneous TID WC  . insulin aspart  0-5 Units Subcutaneous QHS  . insulin detemir  30 Units Subcutaneous QHS  . liver oil-zinc oxide   Topical q morning - 10a  . 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 Strum is a 50 year-old female with a history of hypertension, uncontrolled diabetes mellitus, hyperlipidemia, and two previous stroke like episodes who presented with hypertensive urgency, headache, dizziness, and left arm weakness.  Later developed agitation and altered mental status.  #AMS: Patient with several episodes of significant agitation 6/2 characterized by screaming, physical aggression, emesis, and bowel/bladder incontinence. Has had 2 similar episodes on prior admissions for CVA per husband. No obvious metabolic, hypoxic, or toxic etiology of her symptoms. ABG unremarkable.  She does have bacteruria and  incontinence which may indicate UTI as possible cause. She has a history of depression and is on Escitalopram and amitryptiline at home.  Symptoms could be related to a missed dose of amitryptiline on 6/1, leading to tricyclic discontinuation symptoms.  Neurology doesn't believe this episode is related to a CVA and ordered an EEG for further evaluation and recommended psychiatric consultation.  Psych unable to evaluate initially due to patient noncompliance and somnolence.   - Psychiatry consulted, appreciate recommendations - Continue home Escitalopram 10 mg po daily - Continue home Amitryptiline 25 mg po qhs prn - Take out foley - Bedside sitter - Consider Haldol if significant agitation - f/u EEG - Monitor respiratory status and for aspiration - f/u blood and urine cultures  #CVA: MRI brain on 08/15/2015 showed a small acute white matter infarct in the posterior right MCA territory. Neurology reviewed image and evaluated patient.  They do not believe this is an acute CVA, but an old infarct similarly seen on prior scans. They have recommended discontinuing stroke workup at this time as they feel symptoms are psychiatric in nature.  - Appreciate Neurology's assistance - Further CVA workup cancelled per neuro - Continue home Asp 81 mg daily and Plavix 75 mg daily - Rosuvastatin 20 mg qd - A1c: 10.5 - Lipid panel: Total cholesterol 307, triglycerides 193, HDL 37, LDL 231  # Hypertensive urgency: Systolic BPs up to 227. Given labetalol 10 mg IV in the ED and pressure has been in the 140-160s systolic since then.  - Hold home lisinopril for permissive hypertension.  May consider adding back if patient becomes hypertensive again given Neuro doesn't believe her symptoms are due to CVA  # Headache: Likely 2/2 hypertensive urgency. History of similar intermittent headaches. Takes amitryptiline 25 mg qhs and ibuprofen 800 mg at home for this. Given toradol 15 mg injection and baclofen 10 mg PO in  the ED without resolution.  - Ibuprofen and amitryptyline as needed  #UTI: U/A on admission showed >1000 glucose, positive for protein and nitrites, but negative for LE. Microscopic exam showed many bacteria with hyaline casts. Unlikely to be a contributing factor to her AMS without systemic signs of infection such as a fever or leukocytosis. - f/u Urine and blood culture - Ceftriaxone 2g IV  # Diabetes Mellitus: Blood glucose was 370 on arrival to the ED. Hgb A1c 10.5.  DM coordinator recommended addition of Novolog 5 units TID meal coverage given postprandial glucose has been in the 200s.   - Changed detemir to 30 units basal dosing with moderate SSI.   # Hyperlipidemia: Takes atorvastatin 80 mg BID and rosuvastatin 10 mg qhs at home. Lipid panel significant for LDL 231, triglycerides 193, HDL 37, Cholesterol 307.  - Rosuvastatin as above   Diet: normal diet  Prophylaxis: Lovenox  Code: Full  This is a Psychologist, occupational Note.  The care of the patient was discussed with Dr. Heywood Iles and the assessment and plan formulated with  their assistance.  Please see their attached note for official documentation of the daily encounter.  Jolinda CroakNathaniel T Chaya Dehaan, Med Student 08/17/2015, 8:08 AM

## 2015-08-18 DIAGNOSIS — R2990 Unspecified symptoms and signs involving the nervous system: Secondary | ICD-10-CM | POA: Insufficient documentation

## 2015-08-18 DIAGNOSIS — G9340 Encephalopathy, unspecified: Secondary | ICD-10-CM | POA: Diagnosis not present

## 2015-08-18 DIAGNOSIS — E1165 Type 2 diabetes mellitus with hyperglycemia: Secondary | ICD-10-CM | POA: Diagnosis not present

## 2015-08-18 DIAGNOSIS — R4182 Altered mental status, unspecified: Secondary | ICD-10-CM | POA: Insufficient documentation

## 2015-08-18 DIAGNOSIS — M6248 Contracture of muscle, other site: Secondary | ICD-10-CM | POA: Diagnosis not present

## 2015-08-18 DIAGNOSIS — G43909 Migraine, unspecified, not intractable, without status migrainosus: Secondary | ICD-10-CM

## 2015-08-18 DIAGNOSIS — F418 Other specified anxiety disorders: Secondary | ICD-10-CM | POA: Diagnosis not present

## 2015-08-18 DIAGNOSIS — I10 Essential (primary) hypertension: Secondary | ICD-10-CM | POA: Diagnosis not present

## 2015-08-18 DIAGNOSIS — Z8673 Personal history of transient ischemic attack (TIA), and cerebral infarction without residual deficits: Secondary | ICD-10-CM | POA: Diagnosis not present

## 2015-08-18 LAB — URINE CULTURE: CULTURE: NO GROWTH

## 2015-08-18 LAB — GLUCOSE, CAPILLARY
GLUCOSE-CAPILLARY: 216 mg/dL — AB (ref 65–99)
Glucose-Capillary: 179 mg/dL — ABNORMAL HIGH (ref 65–99)
Glucose-Capillary: 174 mg/dL — ABNORMAL HIGH (ref 65–99)
Glucose-Capillary: 247 mg/dL — ABNORMAL HIGH (ref 65–99)

## 2015-08-18 LAB — SALICYLATE LEVEL

## 2015-08-18 MED ORDER — DIVALPROEX SODIUM 500 MG PO DR TAB
500.0000 mg | DELAYED_RELEASE_TABLET | Freq: Two times a day (BID) | ORAL | Status: DC
  Administered 2015-08-18 – 2015-08-19 (×2): 500 mg via ORAL
  Filled 2015-08-18 (×4): qty 1

## 2015-08-18 MED ORDER — INSULIN DETEMIR 100 UNIT/ML ~~LOC~~ SOLN
35.0000 [IU] | Freq: Every day | SUBCUTANEOUS | Status: DC
  Administered 2015-08-18: 35 [IU] via SUBCUTANEOUS
  Filled 2015-08-18 (×2): qty 0.35

## 2015-08-18 NOTE — Progress Notes (Signed)
Occupational Therapy Evaluation Patient Details Name: Kelly Romero MRN: [id number] DOB: Apr 26, 1965 Today's Date: 08/18/2015    History of Present Illness Kelly Romero is a 50 year-old female with a history of hypertension, uncontrolled diabetes mellitus, hyperlipidemia, and two previous stroke like episodes who presented with hypertensive urgency, headache, dizziness, and left arm weakness. Later developed agitation and altered mental status. Small acute deep white matter infarct RIGHT posterior frontal   Clinical Impression   PTA, pt was independent with ADLs, mobility, and worked as a Runner, broadcasting/film/video at [employer]. Pt currently presents with increased lethargy, cognitive deficits, dizziness, generalized weakness, and LUE weakness and sensation changes. Pt required mod-max assist for transfers and ambulation due to sudden knee buckling, dizziness and increased lethargy - pt is a very high fall risk and RN notified. Pt was also incontinent of bowel on OT arrival and was unaware. Currently recommend SNF as I feel pt could benefit from post-acute rehab prior to return home, however pt appears to have had episodes similar to this before and could possibly progress to only needing HHOT. Will continue to follow acutely.  After session, RN reported pt was stand-by assist to go to the bathroom with some dizziness and weakness noted. Pt demonstrating inconsistent levels of assistance in short period of time.    Follow Up Recommendations  SNF;Supervision/Assistance - 24 hour (make to progress to needing HHOT)    Equipment Recommendations  3 in 1 bedside comode    Recommendations for Other Services       Precautions / Restrictions Precautions Precautions: Fall Restrictions Weight Bearing Restrictions: No      Mobility Bed Mobility Overal bed mobility: Needs Assistance Bed Mobility: Supine to Sit;Sit to Supine     Supine to sit: Min guard Sit to supine: Min guard   General bed  mobility comments: HOB slightly elevated, use of bedrail. Cues to scoot hips forward, but no physical assist required.   Transfers Overall transfer level: Needs assistance Equipment used: Rolling walker (2 wheeled) Transfers: Sit to/from [employer] Sit to Stand: Min assist Stand pivot transfers: Max assist       General transfer comment: Min assist for initial sit-stand and pt unable to grip RW with L hand. Pt became increasingly lethargic and dizzy while ambulating 5' towards bathroom and required max assist to sit safely on chair pulled up behind her. Subsequent sit-stand and stand-pivot transfers required mod-max assist to due pt having increased knee buckling,  dizziness, and lethargy. VCs and tactile cues for initiation, sequencing, and safety throughout entire session once OOB.    Balance Overall balance assessment: Needs assistance Sitting-balance support: No upper extremity supported;Feet supported Sitting balance-Leahy Scale: Good     Standing balance support: Bilateral upper extremity supported;During functional activity Standing balance-Leahy Scale: Poor Standing balance comment: Bilateral knee buckling, dizziness, mod-max assist for balance and to complete safe transfer.                            ADL Overall ADL's : Needs assistance/impaired         Upper Body Bathing: Moderate assistance;Sitting   Lower Body Bathing: Maximal assistance;Sit to/from stand   Upper Body Dressing : Moderate assistance;Sitting   Lower Body Dressing: Maximal assistance;Sit to/from stand   Toilet Transfer: Maximal assistance;Stand-pivot;RW Toilet Transfer Details (indicate cue type and reason): simulated to/from chair and bed Toileting- Clothing Manipulation and Hygiene: Maximal assistance;Sit to/from stand  Functional mobility during ADLs: Maximal assistance;Rolling walker;Cueing for safety;Cueing for sequencing General ADL Comments: Pt increasingly  lethargic when mobilizing OOB. After ambulating 5' towards bathroom, pt's knees began to buckle and pt reported feeling increased dizziness - immediately pulled chair up behind and assisted pt with controlled descent onto chair. Pt required max assist to complete pericare and bathing as she was incontinent of bowel on OT arrival.       Vision Vision Assessment?: Vision impaired- to be further tested in functional context Additional Comments: Unable to complete vision assessment due to pt's increasing lethargy. Pt reports she sometimes sees double, but unsure of accuracy of this report.   Perception     Praxis      Pertinent Vitals/Pain Pain Assessment: No/denies pain     Hand Dominance Right   Extremity/Trunk Assessment Upper Extremity Assessment Upper Extremity Assessment: LUE deficits/detail LUE Deficits / Details: STRENGTH: very weak grip, overall 2/5 strength; ROM: uanble to achieve full shoulder flexion and abduction, elbow flexion/extension with significantly increased time; SENSATION: pt reports tingling and heavy feeling, unable to accurately assess sensation LUE Sensation: decreased light touch LUE Coordination: decreased fine motor;decreased gross motor   Lower Extremity Assessment Lower Extremity Assessment: Generalized weakness;Defer to PT evaluation   Cervical / Trunk Assessment Cervical / Trunk Assessment: Other exceptions Cervical / Trunk Exceptions: forward head, rounded shoulders   Communication Communication Communication: No difficulties;Other (comment) (Lethargic once mobilizing OOB)   Cognition Arousal/Alertness: Lethargic;Suspect due to medications Behavior During Therapy: Flat affect Overall Cognitive Status: Impaired/Different from baseline Area of Impairment: Attention;Following commands;Memory;Safety/judgement;Awareness;Problem solving;Orientation Orientation Level: Disoriented to;Time Current Attention Level: Sustained Memory: Decreased short-term  memory Following Commands: Follows one step commands inconsistently;Follows one step commands with increased time Safety/Judgement: Decreased awareness of safety;Decreased awareness of deficits Awareness: Intellectual Problem Solving: Slow processing;Decreased initiation;Difficulty sequencing;Requires verbal cues;Requires tactile cues General Comments: Pt unable to remember what day of the week she had her "episode" at work or what day of the week it was. Pt also provided incorrect home information that her husband was able to correct. Pt became increasingly lethargic and less responsive once mobilizing OOB. Pt required max cues to keep eyes open and to respond to various directional cues.   General Comments       Exercises       Shoulder Instructions      Home Living Family/patient expects to be discharged to:: Private residence Living Arrangements: Spouse/significant other Available Help at Discharge: Family;Available 24 hours/day Type of Home: Mobile home Home Access: Stairs to enter Entrance Stairs-Number of Steps: 4 Entrance Stairs-Rails: Right;Left;Can reach both Home Layout: One level     Bathroom Shower/Tub: Walk-in shower;Door   [employer] Toilet: Standard     Home Equipment: Environmental consultant - 2 wheels;Wheelchair - manual   Additional Comments: Pt with inaccurate responses, pt's husband corrected some information.      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    OT Diagnosis: Generalized weakness;Cognitive deficits;Hemiplegia non-dominant side   OT Problem List: Decreased range of motion;Decreased strength;Decreased activity tolerance;Impaired balance (sitting and/or standing);Decreased coordination;Decreased cognition;Decreased knowledge of use of DME or AE;Decreased knowledge of precautions;Decreased safety awareness;Impaired sensation;Impaired  tone;Obesity;Impaired UE functional use   OT Treatment/Interventions: Self-care/ADL training;Therapeutic exercise;Energy conservation;DME and/or AE instruction;Therapeutic activities;Cognitive remediation/compensation;Balance training;Patient/family education    OT Goals(Current goals can be found in the care plan section) Acute Rehab OT Goals Patient Stated Goal: to  get better since her daughter-in-law is pregnant with triplets OT Goal Formulation: With patient Time For Goal Achievement: 09/01/15 Potential to Achieve Goals: Good ADL Goals Pt Will Perform Grooming: with supervision;standing Pt Will Perform Upper Body Bathing: with supervision;sitting Pt Will Perform Lower Body Bathing: with supervision;sit to/from stand Pt Will Transfer to Toilet: with supervision;ambulating;bedside commode (over toilet) Pt Will Perform Toileting - Clothing Manipulation and hygiene: with supervision;sitting/lateral leans;sit to/from stand  OT Frequency: Min 2X/week   Barriers to D/C: Decreased caregiver support  Pt's husband unable to provided current level of physical assistance that she requires.       Co-evaluation              End of Session Equipment Utilized During Treatment: Gait belt;Rolling walker Nurse Communication: Mobility status  Activity Tolerance: Patient limited by lethargy Patient left: in bed;with call bell/phone within reach;with bed alarm set;with family/visitor present   Time: [id number] OT Time Calculation (min): 42 min Charges:  OT General Charges $OT Visit: 1 Procedure OT Evaluation $OT Eval Moderate Complexity: 1 Procedure OT Treatments $Self Care/Home Management : 23-37 mins G-Codes: OT G-codes **NOT FOR INPATIENT CLASS** Functional Assessment Tool Used: clinical judgement Functional Limitation: Self care Self Care Current Status ([id number]): At least 40 percent but less than 60 percent impaired, limited or restricted Self Care Goal Status ([id number]): At least 20  percent but less than 40 percent impaired, limited or restricted  Nils PyleJulia Russel Morain, OTR/L Pager: [phone] 08/18/2015, 11:17 AM

## 2015-08-18 NOTE — Progress Notes (Signed)
   Subjective: This morning, she was answering questions and appeared markedly improved as compared to 48 hours ago. We explained to her that there may be an underlying seizure disorder.   Objective: Vital signs in last 24 hours: Filed Vitals:   08/17/15 1436 08/17/15 2115 08/18/15 0023 08/18/15 0409  BP: 139/66 118/52 127/66 120/58  Pulse: 89 82 79 76  Temp: 98.7 F (37.1 C) 98.1 F (36.7 C) 98.2 F (36.8 C) 98.4 F (36.9 C)  TempSrc: Oral  Oral Oral  Resp: 17 18 15    Height:      Weight:      SpO2: 100% 92% 96% 95%   Weight change:   Intake/Output Summary (Last 24 hours) at 08/18/15 1243 Last data filed at 08/18/15 1036  Gross per 24 hour  Intake    360 ml  Output    250 ml  Net    110 ml   General: resting in bed, sleepy HEENT: PERRL, no scleral icterus Cardiac: tachycardic, no murmurms, no rubs, or gallops Pulm: clear to auscultation bilaterally in the anterior lung fields, moving normal volumes of air Abd: soft, nondistended Ext: warm and well perfused, no pedal edema, moves all extremities Neuro: oriented to name, place, year; moving all extremities spontaneously, slowed movement of LUE with cerebellar testing  Assessment/Plan: Principal Problem:   Acute encephalopathy Active Problems:   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 Blood and urine cultures unremarkable otherwise.  -Continue Depakote 250mg  twice daily today -Continue home Escitalopram 10 mg po daily -Continue home Amitryptyline 25 mg po qhs -Discontinue bedside sitter -OT recommending SNF but will monitor for next 24 hours to decide.  History of CVA: MRI brain on 08/15/2015 showed a small acute white matter infarct in the posterior right MCA.  Neurology has reviewed imaging and reassessed patient today and do not believe this is an acute CVA, but an old infarct similarly seen on prior scans. Lipid panel notable for total cholesterol 307, Triglycerides 193, HDL 37, LDL 231.  -Continue Plavix 75 mg daily only as aspirin was to be discontinued per outpatient neurologist note -Continue Rosuvastatin 20 mg qd -Appreciate Neurology's assistance  Insulin-dependent Type 2 Diabetes Mellitus: A1c 10.5. CBGs trending 150s-170s. -Increase detemir 30->35 units basal dosing with moderate SSI and HS coverage.  HTN: Mostly normotensive off home lisinopril.   Dispo: Disposition is deferred at this time, awaiting improvement of current medical problems.  Anticipated discharge in approximately 1 day.   The patient does have a current PCP Arvin Collard(John F. [name] II, MD) and [facility] after discharge.       Beather Arbourushil Rollie Hynek V, MD 08/18/2015, 12:43 PM

## 2015-08-18 NOTE — Progress Notes (Signed)
Interval History:                                                                                                                      Kelly Romero is an 50 y.o. female patient with  improved encephalopathy, at her baseline mental status now. Her migraine headache is also significantly improved, reports 2/10 on pain scale now. She was on IV Depacon started yesterday. She tolerated the medication without side effects. Also on baclofen 5 mg 3 times a day, improved neck Spasms.  No other new neurological symptoms. Both patient and her husband happy with her improved symptoms and response to treatment  As described in my previous notes, she does not have an acute infarct on the MRI shows and currently reported on the previous radiology report. I discussed this again with radiologist, Dr. 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     Past Medical History: Past Medical History  Diagnosis Date  . Diabetes mellitus without complication (HCC)   . Headache   . Hypertension   . Hyperlipidemia     Past Surgical History  Procedure Laterality Date  . Cholecystectomy    . Hernia repair    . Appendectomy    . Foot ganglion excision    . Vaginal hysterectomy    . Tee without cardioversion N/A 04/19/2015    Procedure: TRANSESOPHAGEAL ECHOCARDIOGRAM (TEE);  Surgeon: Jake Bathe, MD;  Location: [facility] ENDOSCOPY;  Service: Cardiovascular;  Laterality: N/A;    Family History: Family History  Problem Relation Age of Onset  . COPD Mother   . Stroke Father     6s  . Heart failure Brother   . Cancer Maternal Grandmother     unknown   . COPD    . Heart failure Maternal Grandfather   . Hypertension Maternal Grandfather   . Cancer  Paternal Grandfather     lung  . Diabetes Paternal Grandmother   . Heart attack Father 31    Social History:   reports that she has never smoked. She has never used smokeless tobacco. She reports that she does not drink alcohol or use illicit drugs.  Allergies:  Allergies  Allergen Reactions  . Erythromycin Itching     Medications:  Current facility-administered medications:  .  amitriptyline (ELAVIL) tablet 25 mg, 25 mg, Oral, QHS, Rushil Patel V, MD, 25 mg at 08/17/15 2150 .  baclofen (LIORESAL) tablet 5 mg, 5 mg, Oral, TID, Salia Cangemi Daniel Nones, MD, 5 mg at 08/18/15 [phone] .  clopidogrel (PLAVIX) tablet 75 mg, 75 mg, Oral, Daily, Darreld Mclean, MD, 75 mg at 08/18/15 0825 .  divalproex (DEPAKOTE) DR tablet 500 mg, 500 mg, Oral, Q12H, Creola Krotz Daniel Nones, MD, 500 mg at 08/18/15 1246 .  enoxaparin (LOVENOX) injection 40 mg, 40 mg, Subcutaneous, Q24H, Veronda P Bryk, RPH, 40 mg at 08/18/15 0826 .  escitalopram (LEXAPRO) tablet 10 mg, 10 mg, Oral, Daily, Darreld Mclean, MD, 10 mg at 08/18/15 0826 .  insulin aspart (novoLOG) injection 0-15 Units, 0-15 Units, Subcutaneous, TID WC, Darreld Mclean, MD, 3 Units at 08/18/15 1230 .  insulin aspart (novoLOG) injection 0-5 Units, 0-5 Units, Subcutaneous, QHS, Darreld Mclean, MD, 2 Units at 08/16/15 2200 .  insulin detemir (LEVEMIR) injection 35 Units, 35 Units, Subcutaneous, QHS, Rushil Patel V, MD .  liver oil-zinc oxide (DESITIN) 40 % ointment, , Topical, q morning - 10a, Selina Cooley, MD, 1 application at 08/17/15 1000 .  LORazepam (ATIVAN) injection 1 mg, 1 mg, Intravenous, PRN, Pricilla Loveless, MD, 1 mg at 08/15/15 1732 .  rosuvastatin (CRESTOR) tablet 20 mg, 20 mg, Oral, QHS, Darreld Mclean, MD, 20 mg at 08/17/15 2150   Neurologic Examination:                                                                                                      Today's Vitals   08/17/15 2115 08/18/15 0023 08/18/15 0409 08/18/15 0800  BP: 118/52 127/66 120/58   Pulse: 82 79 76   Temp: 98.1 F (36.7 C) 98.2 F (36.8 C) 98.4 F (36.9 C)   TempSrc:  Oral Oral   Resp: 18 15    Height:      Weight:      SpO2: 92% 96% 95%   PainSc:  0-No pain  0-No pain   Alert, normal speech and follows commands appropriately, is calm and cooperative. Full motor strength. No ataxia or tremors noted.   Lab Results: Basic Metabolic Panel:  Recent Labs Lab 08/15/15 0820 08/15/15 0841 08/16/15 0753 08/17/15 0603  NA 132* 135 136 135  K 4.0 4.1 3.6 3.2*  CL 98* 95* 99* 97*  CO2 28  --  29 27  GLUCOSE 370* 369* 226* 244*  BUN 19 23* 18 16  CREATININE 0.86 0.80 0.88 0.89  CALCIUM 9.4  --  9.3 9.2    Liver Function Tests:  Recent Labs Lab 08/15/15 0820 08/16/15 0753 08/17/15 0603  AST ALT ALKPHOS 91 96 87  BILITOT 0.7 0.7 0.7  PROT 6.7 6.7 6.2*  ALBUMIN 3.1* 2.9* 2.6*   No results for input(s): LIPASE, AMYLASE in the last 168 hours. No results for input(s): AMMONIA in the last 168 hours.  CBC:  Recent Labs Lab 08/15/15 0820 08/15/15 9604 08/16/15 5409 08/17/15 8119  WBC 6.0  --  11.7* 7.9  NEUTROABS 4.0  --   --   --   HGB 12.4 12.9 12.9 12.8  HCT 38.2 38.0 40.3 40.0  MCV 83.6  --  84.3 83.2  PLT 221  --  246 232    Cardiac Enzymes: No results for input(s): CKTOTAL, CKMB, CKMBINDEX, TROPONINI in the last 168 hours.  Lipid Panel:  Recent Labs Lab 08/16/15 0505  CHOL 307*  TRIG 193*  HDL 37*  CHOLHDL 8.3  VLDL 39  LDLCALC 161*    CBG:  Recent Labs Lab 08/17/15 1248 08/17/15 1716 08/17/15 2200 08/18/15 0811 08/18/15 1147  GLUCAP 250* 290* 154* 179* 174*    Microbiology: Results for orders placed or performed during the hospital encounter of 08/15/15  Culture, blood (routine x 2)     Status: None (Preliminary result)   Collection Time: 08/16/15  8:15 AM  Result  Value Ref Range Status   Specimen Description BLOOD RIGHT HAND  Final   Special Requests IN PEDIATRIC BOTTLE 4CC  Final   Culture NO GROWTH < 24 HOURS  Final   Report Status PENDING  Incomplete  Culture, blood (routine x 2)     Status: None (Preliminary result)   Collection Time: 08/16/15  8:20 AM  Result Value Ref Range Status   Specimen Description BLOOD RIGHT ANTECUBITAL  Final   Special Requests IN PEDIATRIC BOTTLE .5CC  Final   Culture NO GROWTH < 24 HOURS  Final   Report Status PENDING  Incomplete  Culture, Urine     Status: None   Collection Time: 08/16/15  6:37 PM  Result Value Ref Range Status   Specimen Description URINE, CATHETERIZED  Final   Special Requests NONE  Final   Culture NO GROWTH  Final   Report Status 08/18/2015 FINAL  Final    Imaging: Ct Head Wo Contrast  08/15/2015  CLINICAL DATA:  Patient with headache, dizziness and stumbling. Slurred speech. Right-sided weakness. EXAM: CT HEAD WITHOUT CONTRAST TECHNIQUE: Contiguous axial images were obtained from the base of the skull through the vertex without intravenous contrast. COMPARISON:  Brain CT 05/13/2015. FINDINGS: Ventricles and sulci are appropriate for patient's age. Periventricular and subcortical white matter hypodensity compatible with chronic microvascular ischemic changes. 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 No evidence for acute cortically based infarct, intracranial hemorrhage, mass lesion or mass-effect. Calvarium is intact. Paranasal sinuses are well aerated. Mastoid air cells unremarkable. IMPRESSION: No acute intracranial process. Chronic microvascular ischemic changes. Critical Value/emergent results were called by telephone at the time of interpretation on 08/15/2015 at 8:54 am to Dr. Lavon Paganini, who verbally acknowledged these results. Electronically Signed   By: Annia Belt M.D.   On: 08/15/2015 08:56   Mr Laqueta Jean WR  Contrast  08/18/2015  ADDENDUM REPORT: 08/18/2015 11:21 ADDENDUM: In comparison with earlier MRI scans the brain, a similar area diffusion abnormality and T2 signal change is present in the is same area of the posterior right frontal lobe. Although there is no abnormality evident on the susceptibility weighted images, this may represent 3T artifact rather than acute infarct. Follow-up MRI at 3T in another 2-3 months may be useful for further evaluation of this as an acute versus chronic lesion. Electronically Signed   By: Marin Roberts M.D.   On: 08/18/2015 11:21  08/18/2015  CLINICAL DATA:  50 y.o. female presenting in hypertensive urgency, with headache, and LEFT arm weakness. Stroke Risk  Factors - diabetes mellitus, hyperlipidemia and hypertension EXAM: MRI HEAD WITHOUT AND WITH CONTRAST TECHNIQUE: Multiplanar, multiecho pulse sequences of the brain and surrounding structures were obtained without and with intravenous contrast. CONTRAST:  MultiHance 20 mL. COMPARISON:  Diffusion-weighted imaging earlier today. FINDINGS: Significant motion degradation. Small or subtle lesions could be overlooked. Redemonstrated is a small acute nonhemorrhagic deep white matter infarct, RIGHT posterior frontal lobe. No other similar abnormalities. No progression from earlier. Premature for age global atrophy. Moderately extensive T2 and FLAIR hyperintensities throughout the white matter, likely chronic microvascular ischemic change. Moderate-sized remote deep white matter lacunar infarct adjacent to the RIGHT frontal horn. This shows sequelae chronic hemorrhage. Flow voids are maintained. Post infusion, no definite abnormal enhancement of the brain or meninges, given the non orthogonal positioning and motion degradation. Extracranial soft tissues grossly unremarkable. 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 This is nonhemorrhagic, and stable from earlier today. Motion  degraded examination demonstrating no associated hemorrhage, or associated postcontrast enhancement. Electronically Signed: By: Elsie StainJohn T Curnes M.D. On: 08/15/2015 19:52   Mr Brain Ltd W/o Cm  08/15/2015  CLINICAL DATA:  50 year old female with acute onset slurred speech and headache upon waking today. Limited diffusion only Brain MRI requested by Neurology. EXAM: LIMITED MRI HEAD WITHOUT CONTRAST TECHNIQUE: Multiplanar, multiecho pulse sequences of the brain and surrounding structures were obtained without intravenous contrast. COMPARISON:  Head CT without contrast 0827 hours today[facility]. FINDINGS: Axial in coronal diffusion weighted imaging only as requested by Neurology. Small 8-9 mm focus of restricted diffusion involving the right corona radiata and some subcortical white matter in the posterior right superior frontal lobe (series 3, image 36 and series 4, image 14). This appears to be separate from a small linear area of chronic T2 and FLAIR white matter signal abnormality here seen on the prior studies. No evidence of associated mass effect. No contralateral or other restricted diffusion. No ventriculomegaly. Stable cerebral volume. Facilitated diffusion in the Areas of white matter adjacent to the right frontal horn and caudate nucleus as well as in the bilateral periatrial white matter, which appears to correspond to chronic small vessel ischemia. IMPRESSION: 1. 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 2. Evidence of underlying chronic small vessel disease as seen on the 05/13/2015 comparison. Electronically Signed   By: Odessa FlemingH  Hall M.D.   On: 08/15/2015 10:38    Assessment and plan:   Kelly Romero is an 50 y.o. female patient with  improved encephalopathy, at her baseline mental status now. Her migraine headache is also significantly improved, reports 2/10 on pain scale now. She was on  IV Depacon started yesterday. She tolerated the medication without side effects. Also on baclofen 5 mg 3 times a day, improved neck Spasms.  No other new neurological symptoms. Both patient and her husband happy with her improved symptoms and response to treatment  As described in my previous notes, she does not have an acute infarct on the MRI shows and currently reported on the previous radiology report. I discussed this again with radiologist, Dr. 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  Nonfocal neurological exam.  At this  time, recommend switching her IV Depacon to by mouth Depakote DR 500 mg twice a day, continue baclofen 5 g 3 times a day. Recommend physical and occupation therapy follow up for gait and balance training. Her discharge planning based on physical therapy recommendations if she needs any inpatient rehabilitation versus outpatient therapy. Patient was encouraged to participate with therapy.  No other new recommendations from neurology standpoint. We'll sign off.

## 2015-08-19 DIAGNOSIS — G9340 Encephalopathy, unspecified: Secondary | ICD-10-CM | POA: Diagnosis not present

## 2015-08-19 LAB — CBC
HEMATOCRIT: 37.7 % (ref 36.0–46.0)
Hemoglobin: 11.9 g/dL — ABNORMAL LOW (ref 12.0–15.0)
MCH: 26.4 pg (ref 26.0–34.0)
MCHC: 31.6 g/dL (ref 30.0–36.0)
MCV: 83.6 fL (ref 78.0–100.0)
Platelets: 230 10*3/uL (ref 150–400)
RBC: 4.51 MIL/uL (ref 3.87–5.11)
RDW: 13.2 % (ref 11.5–15.5)
WBC: 4.8 10*3/uL (ref 4.0–10.5)

## 2015-08-19 LAB — COMPREHENSIVE METABOLIC PANEL
ALBUMIN: 2.5 g/dL — AB (ref 3.5–5.0)
ALK PHOS: 72 U/L (ref 38–126)
ALT: 13 U/L — AB (ref 14–54)
AST: 15 U/L (ref 15–41)
Anion gap: 7 (ref 5–15)
BILIRUBIN TOTAL: 0.3 mg/dL (ref 0.3–1.2)
BUN: 36 mg/dL — AB (ref 6–20)
CALCIUM: 9.2 mg/dL (ref 8.9–10.3)
CO2: 31 mmol/L (ref 22–32)
CREATININE: 1.19 mg/dL — AB (ref 0.44–1.00)
Chloride: 100 mmol/L — ABNORMAL LOW (ref 101–111)
GFR calc Af Amer: >60 mL/min (ref >60)
GFR calc non Af Amer: 52 mL/min — ABNORMAL LOW (ref >60)
GLUCOSE: 130 mg/dL — AB (ref 65–99)
POTASSIUM: 3.4 mmol/L — AB (ref 3.5–5.1)
Sodium: 138 mmol/L (ref 135–145)
TOTAL PROTEIN: 5.7 g/dL — AB (ref 6.5–8.1)

## 2015-08-19 LAB — GLUCOSE, CAPILLARY
Glucose-Capillary: 101 mg/dL — ABNORMAL HIGH (ref 65–99)
Glucose-Capillary: 109 mg/dL — ABNORMAL HIGH (ref 65–99)

## 2015-08-19 MED ORDER — BACLOFEN 10 MG PO TABS: 5.0000 mg | ORAL_TABLET | Freq: Three times a day (TID) | ORAL | Status: DC

## 2015-08-19 MED ORDER — DIVALPROEX SODIUM 500 MG PO DR TAB: 500.0000 mg | DELAYED_RELEASE_TABLET | Freq: Two times a day (BID) | ORAL | Status: DC

## 2015-08-19 MED ORDER — POTASSIUM CHLORIDE 20 MEQ/15ML (10%) PO SOLN
40.0000 meq | Freq: Once | ORAL | Status: AC
  Administered 2015-08-19: 40 meq via ORAL
  Filled 2015-08-19: qty 30

## 2015-08-19 MED ORDER — ROSUVASTATIN CALCIUM 20 MG PO TABS: 20.0000 mg | ORAL_TABLET | Freq: Every day | ORAL | Status: DC

## 2015-08-19 NOTE — Discharge Summary (Signed)
Name: IQRA ROTUNDO MRN: [id number] DOB: 1965/11/18 50 y.o. PCP: Noni Saupe, MD  Date of Admission: 08/15/2015  8:13 AM Date of Discharge: 08/19/2015 Attending Physician: Levert Feinstein, MD  Discharge Diagnosis: 1. 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     baclofen 10 MG tablet  Commonly known as:  LIORESAL  Take 0.5 tablets (5 mg total) by mouth 3 (three) times daily.     clopidogrel 75 MG tablet  Commonly known as:  PLAVIX  Take 75 mg by mouth daily.     divalproex 500 MG DR tablet  Commonly known as:  DEPAKOTE  Take 1 tablet (500 mg total) by mouth every 12 (twelve) hours.     escitalopram 10 MG tablet  Commonly known as:  LEXAPRO  Take 10 mg by mouth daily.     GLYXAMBI 10-5 MG Tabs  Generic drug:  Empagliflozin-Linagliptin  Take 1 tablet by mouth daily.     insulin detemir 100 UNIT/ML injection  Commonly known as:  LEVEMIR  Inject 80 Units into the skin daily.     lisinopril 2.5 MG tablet  Commonly known as:  PRINIVIL,ZESTRIL  Take 2.5 mg by mouth daily.     rosuvastatin 20 MG tablet  Commonly known as:  CRESTOR  Take 1 tablet (20 mg total) by mouth at bedtime.        Disposition and follow-up:   Ms.Raini R Lutter was discharged from [facility] in Stable condition.  At the hospital follow up visit please address:  1.   Acute  encephalopathy/migraine: Is patient having recurrent symptoms, tolerating Depakote and Baclofen?  2.  Labs / imaging needed at time of follow-up: BMP, LFTs, and Depakote levels.  3.  Pending labs/ test needing follow-up: none  Follow-up Appointments: Follow-up Information    Follow up with [facility] Valrie Hart., MD. Go on 09/02/2015.   Specialty:  Family Medicine   Why:  @ 1030 AM   Contact information:   [address] [place] [address] [phone]       Follow up with Cira Servant, DO. Go on 08/26/2015.   Specialty:  Neurology   Why:  @ 915 AM   Contact information:   [address]  [address] [place] [address] [phone]       Follow up with [facility].   Contact information:   [address] [place] [address] [phone]       Discharge Instructions:   Consultations: Treatment Team:  Leata Mouse, MD  Procedures Performed:  Ct Head Wo Contrast  08/15/2015  CLINICAL DATA:  Patient with headache, dizziness and stumbling. Slurred speech. Right-sided weakness. EXAM: CT HEAD WITHOUT CONTRAST TECHNIQUE: Contiguous axial images were obtained from the base of the skull through the vertex  without intravenous contrast. COMPARISON:  Brain CT 05/13/2015. FINDINGS: Ventricles and sulci are appropriate for patient's age. Periventricular and subcortical white matter hypodensity compatible with chronic microvascular ischemic changes. 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 No evidence for acute cortically based infarct, intracranial hemorrhage, mass lesion or mass-effect. Calvarium is intact. Paranasal sinuses are well aerated. Mastoid air cells unremarkable. IMPRESSION: No acute intracranial process. Chronic microvascular ischemic changes. Critical Value/emergent results were called by telephone at the time of interpretation on 08/15/2015 at 8:54 am to Dr.  Lavon Paganini, who verbally acknowledged these results. Electronically Signed   By: Annia Belt M.D.   On: 08/15/2015 08:56   Mr Laqueta Jean UJ Contrast  08/18/2015  ADDENDUM REPORT: 08/18/2015 11:21 ADDENDUM: In comparison with earlier MRI scans the brain, a similar area diffusion abnormality and T2 signal change is present in the is same area of the posterior right frontal lobe. Although there is no abnormality evident on the susceptibility weighted images, this may represent 3T artifact rather than acute infarct. Follow-up MRI at 3T in another 2-3 months may be useful for further evaluation of this as an acute versus chronic lesion. Electronically Signed   By: Marin Roberts M.D.   On: 08/18/2015 11:21  08/18/2015  CLINICAL DATA:  50 y.o. female presenting in hypertensive urgency, with headache, and LEFT arm weakness. Stroke Risk Factors - diabetes mellitus, hyperlipidemia and hypertension EXAM: MRI HEAD WITHOUT AND WITH CONTRAST TECHNIQUE: Multiplanar, multiecho pulse sequences of the brain and surrounding structures were obtained without and with intravenous contrast. CONTRAST:  MultiHance 20 mL. COMPARISON:  Diffusion-weighted imaging earlier today. FINDINGS: Significant motion degradation. Small or subtle lesions could be overlooked. Redemonstrated is a small acute nonhemorrhagic deep white matter infarct, RIGHT posterior frontal lobe. No other similar abnormalities. No progression from earlier. Premature for age global atrophy. Moderately extensive T2 and FLAIR hyperintensities throughout the white matter, likely chronic microvascular ischemic change. Moderate-sized remote deep white matter lacunar infarct adjacent to the RIGHT frontal horn. This shows sequelae chronic hemorrhage. Flow voids are maintained. Post infusion, no definite abnormal enhancement of the brain or meninges, given the non orthogonal positioning and motion degradation. Extracranial soft tissues grossly unremarkable. IMPRESSION: Small  acute deep white matter infarct RIGHT posterior frontal coronal radiata. This is nonhemorrhagic, and stable from earlier today. Motion degraded examination demonstrating no associated hemorrhage, or associated postcontrast enhancement. Electronically Signed: By: Elsie Stain M.D. On: 08/15/2015 19:52   Mr Brain Ltd W/o Cm  08/15/2015  CLINICAL DATA:  50 year old female with acute onset slurred speech and headache upon waking today. Limited diffusion only Brain MRI requested by Neurology. EXAM: LIMITED MRI HEAD WITHOUT CONTRAST TECHNIQUE: Multiplanar, multiecho pulse sequences of the brain and surrounding structures were obtained without intravenous contrast. COMPARISON:  Head CT without contrast 0827 hours today. [facility] Brain MRI 05/13/2015. [facility] brain MRI 02/22/2015. FINDINGS: Axial in coronal diffusion weighted imaging only as requested by Neurology. Small 8-9 mm focus of restricted diffusion involving the right corona radiata and some subcortical white matter in the posterior right superior frontal lobe (series 3, image 36 and series 4, image 14). This appears to be separate from a small linear area of chronic T2 and FLAIR white matter signal abnormality here seen on the prior studies. No evidence of associated mass effect. No contralateral or other restricted diffusion. No ventriculomegaly. Stable cerebral volume. Facilitated diffusion in the Areas of white matter adjacent to the right  frontal horn and caudate nucleus as well as in the bilateral periatrial white matter, which appears to correspond to chronic small vessel ischemia. IMPRESSION: 1. 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 2. Evidence of underlying chronic small vessel disease as seen on the 05/13/2015 comparison. Electronically Signed   By: Odessa Fleming M.D.   On: 08/15/2015 10:38    2D Echo: TTE 08/16/2015 Left ventricle: The cavity size was normal. Systolic function was  vigorous.  The estimated ejection fraction was in the range of 65%  to 70%. Wall motion was normal; there were no regional wall  motion abnormalities. There was an increased relative  contribution of atrial contraction to ventricular filling.  Doppler parameters are consistent with abnormal left ventricular  relaxation (grade 1 diastolic dysfunction). - Aortic valve: Trileaflet; normal thickness, mildly calcified  leaflets.  Cardiac Cath:   Admission HPI:  Ms. Curley Fayette is a 50 year old female with PMH of HTN, T2DM, HLD, depression, and CVA who presents with dizziness, headache, slurred speech, left arm weakness. Patient's family is present, who provide additional history. Her headache began last night, located at the front of her head, which found partial relief from Ibuprofen. She went to work and was noted to have new onset dizziness, left arm weakness, and slurred speech around 0730. She reported difficulty typing due to her weakness. She did not try to ambulate due to her dizziness, which persisted regardless of position. She reports difficulty finding words, unable to say what she intended. She reports occasional palpitations at night, which occurred last night as well. She had nausea without vomiting. She denies any lower extremity weakness, chest pain, SOB, loss of consciousness, or seizure-like activity. She reports one episode of urinary incontinence yesterday, but no loss of bowel.  Patient had 2 similar episodes worked up in the last year, which were more severe symptom-wise per family. In September 2016 she was seen at [facility] for slurred speech and right arm weakness and suspected to have a TIA after workup. TTE at that time reported a PFO. In December 2016, she was seen at [facility] with headache and slurred speech and found to have a left occipital subacute infarct on MRI of the brain. TTE at that time did not mention a PFO. She was seen by Dr. Everlena Cooper of Neurology  on follow up on 04/15/2015 and scheduled for TEE. This was completed on 04/19/2015 which showed an EF of 60-65%, but no PFO.  In the ED, she was noted to be hypertensive at 206/86. CT head was negative for acute changes. MRI brain was performed and showed a small acute white matter infarct in the posterior right MCA territory. Neurology was consulted in the ED. Per ED note, "Dr. Lavon Paganini looks at images he thinks it's similar to Feb 2017 and thinks it's not a CVA. Recommends full MRI w/ and w/o contrast and admission to medicine for BP/DM control."   Social Hx: Never smoker, no alcohol, no illicit drug use. Works as an Data processing manager.  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 Neurology reviewed imaging and reassessed patient and did not believe this was an acute CVA, but an old infarct similarly seen on prior scans. She developed considerable agitated, delirious, screaming fits interspersed with lethargic episodes. She was empirically treated for an asymptomatic UTI given her incontinence and inability to provide history due to her delirious state, however this was considered less likely to be the cause of her symptoms. Neurology had considered a psychogenic component or seizure activity (EEG with generalized slow activity but not seizure focus) and eventually her symptoms were thought related to atypical manifestations of migraine headaches. She was started on Depakote and had quick improvement from delirious and  agitated fits to a return to her baseline on hospital day 3. Baclofen was also initiated for muscle spasms.   Discharge Vitals:   BP 156/74 mmHg  Pulse 85  Temp(Src) 97.8 F (36.6 C) (Oral)  Resp 18  Ht 5\' 4"  (1.626 m)  Wt 226 lb 6.6 oz (102.7 kg)  BMI 38.84 kg/m2  SpO2 98%  Discharge Labs:  No results found for this or any previous visit (from the past 24 hour(s)).  Signed: Darreld McleanVishal Faustina Gebert, MD 08/21/2015, 6:27 PM    Services [facility] Discharge[facility] Equipment Ordered on Discharge: 3 in 1

## 2015-08-19 NOTE — Progress Notes (Signed)
Physical Therapy Treatment Patient Details Name: Kelly Romero MRN: [id number] DOB: 05/31/1965 Today's Date: 08/19/2015    History of Present Illness Kelly Romero is a 50 year-old female with a history of hypertension, uncontrolled diabetes mellitus, hyperlipidemia, and two previous stroke like episodes who presented with hypertensive urgency, headache, dizziness, and left arm weakness. Later developed agitation and altered mental status. Small acute deep white matter infarct RIGHT posterior frontal    PT Comments    Patient progressing toward mobility goals. Stair training complete. Pt c/o dizziness when ambulating and needed seated rest breaks. BP checked frequently (see below). Pt continues to demo generalized weakness, balance deficits, and decreased activity tolerance. Recommending OPPT for further skilled PT services to maximize independence and safety with mobility.    Follow Up Recommendations  Outpatient PT;Supervision/Assistance - 24 hour (May progress well enough to not need HHPT)     Equipment Recommendations  3in1 (PT)    Recommendations for Other Services OT consult     Precautions / Restrictions Precautions Precautions: Fall Restrictions Weight Bearing Restrictions: No    Mobility  Bed Mobility               General bed mobility comments: pt sitting EOB upon PT arrival  Transfers Overall transfer level: Needs assistance Equipment used: Rolling walker (2 wheeled) Transfers: Sit to/from Stand Sit to Stand: Min guard         General transfer comment: min guard for safety; cues for hand placement  Ambulation/Gait Ambulation/Gait assistance: Supervision Ambulation Distance (Feet): 100 Feet (2 seated rest breaks) Assistive device: Rolling walker (2 wheeled) Gait Pattern/deviations: Step-through pattern;Decreased stride length;Trunk flexed     General Gait Details: slow cadence and decreased step length; c/o dizziness with ambulation; cues for  posture, stride length, and position of RW   Stairs Stairs: Yes Stairs assistance: Min guard Stair Management: One rail Right;Forwards;Sideways Number of Stairs: 8 General stair comments: educated pt on sequencing and technique; min guard for safety  Wheelchair Mobility    Modified Rankin (Stroke Patients Only) Modified Rankin (Stroke Patients Only) Pre-Morbid Rankin Score: No symptoms Modified Rankin: Slight disability     Balance Overall balance assessment: Needs assistance Sitting-balance support: No upper extremity supported;Feet supported Sitting balance-Leahy Scale: Good     Standing balance support: Bilateral upper extremity supported Standing balance-Leahy Scale: Fair                      Cognition Arousal/Alertness: Awake/alert Behavior During Therapy: WFL for tasks assessed/performed Overall Cognitive Status: Within Functional Limits for tasks assessed                      Exercises      General Comments General comments (skin integrity, edema, etc.): BP in sitting 95/59, in standing 98/62, and after ambulation 113/69; husband present for session      Pertinent Vitals/Pain Pain Assessment: No/denies pain    Home Living                      Prior Function            PT Goals (current goals can now be found in the care plan section) Acute Rehab PT Goals Patient Stated Goal: did not state PT Goal Formulation: Patient unable to participate in goal setting Time For Goal Achievement: 08/31/15 Potential to Achieve Goals: Good Progress towards PT goals: Progressing toward goals    Frequency  Min 4X/week  PT Plan Discharge plan needs to be updated    Co-evaluation             End of Session Equipment Utilized During Treatment: Gait belt Activity Tolerance: Patient tolerated treatment well Patient left: with call bell/phone within reach;with family/visitor present;in bed     Time: 1225-1300 PT Time Calculation  (min) (ACUTE ONLY): 35 min  Charges:  $Gait Training: 8-22 mins $Therapeutic Activity: 8-22 mins                    G Codes:      Derek Mound, PTA Pager: [phone]   08/19/2015, 1:49 PM

## 2015-08-19 NOTE — Discharge Instructions (Signed)
The neurologists believe that the symptoms you were having which brought you to the hospital were NOT related to a new stroke. These might have been side-effects related to the headaches you are having. We have started a medicine called Depakote to help with this. Please try to follow up with Dr. Jeanie Seweredding and Dr. Everlena CooperJaffe in the next few weeks to keep track of your progress and any needed changes to your medical management.

## 2015-08-19 NOTE — Progress Notes (Signed)
   Subjective: Patient alert and oriented this morning. Answering questions appropriately. She reports some continued dizziness with ambulation. She is eager to go home and reports that her husband will be home to assist her, but may need extra help for a short period of time after discharge.  Objective: Vital signs in last 24 hours: Filed Vitals:   08/18/15 1729 08/18/15 2223 08/19/15 0528 08/19/15 1036  BP: 118/80 141/69 120/67 142/79  Pulse: 83 82 78 85  Temp: 97.8 F (36.6 C) 98.7 F (37.1 C) 97.8 F (36.6 C)   TempSrc:      Resp: 17 18 18    Height:      Weight:      SpO2: 97% 95% 97%    Weight change:   Intake/Output Summary (Last 24 hours) at 08/19/15 1155 Last data filed at 08/18/15 2240  Gross per 24 hour  Intake      0 ml  Output    300 ml  Net   -300 ml   General: resting in bed, no acute distress HEENT: PERRL, EOMI, no scleral icterus Cardiac: RRR, no murmurs, no rubs, or gallops Pulm: clear to auscultation bilaterally, moving normal volumes of air Abd: soft, nondistended Ext: warm and well perfused, no pedal edema, moves all extremities Neuro: alert, oriented to name, place, year; finger to nose intact; able to spell house, but has difficulty spelling backwards.  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 Blood and urine cultures unremarkable otherwise.  -Continue Depakote 500mg  twice daily -Continue home Escitalopram 10 mg po daily -Continue home Amitryptyline 25 mg po qhs -f/u PT recommendations, will likely need home health PT. She is resistant to go to SNF. -F/u with  Neurology, Dr. Everlena CooperJaffe outpatient -Will need f/u LFTs and Depakote level outpatient  History of CVA: MRI brain on 08/15/2015 showed a small acute white matter infarct in the posterior right MCA. Neurology has reviewed imaging and reassessed patient and do not believe this is an acute CVA, but an old infarct similarly seen on prior scans. Lipid panel notable for total cholesterol 307, Triglycerides 193, HDL 37, LDL 231.  -Continue Plavix 75 mg daily only as aspirin was to be discontinued per outpatient neurologist note -Continue Rosuvastatin 20 mg qd -Appreciate Neurology's assistance  Insulin-dependent Type 2 Diabetes Mellitus: A1c 10.5. CBGs trending 150s-170s. -Increase detemir 35 units basal dosing with moderate SSI and HS coverage.  HTN: Mostly normotensive off home lisinopril. SCr slightly increased this morning, likely from decreased oral intake. -f/u with Dr. Jeanie Seweredding after discharge, repeat BMP at that time -resume Lisinopril on discharge  Dispo:  Anticipated discharge today pending PT recs for home health assistance.  The patient does have a current PCP Arvin Collard(John F. [name] II, MD) and does not need [facility] after discharge.       Darreld McleanVishal Afomia Blackley, MD 08/19/2015, 11:55 AM

## 2015-08-19 NOTE — Progress Notes (Signed)
Patient was discharged home accompanied by her husband with no apparent distress noted. Medications and discharge instructions explained to the patient. She verbalized understanding. Copies given to her including the Note for work.

## 2015-08-19 NOTE — Care Management Note (Signed)
Case Management Note  Patient Details  Name: Kelly Romero MRN: [id number] Date of Birth: 12/28/1965  Subjective/Objective:                 Spoke with patient and hisband in room. They state they have walker and wc at home, and may have shower seat. Insurance does not cover BSC or shower stool. Patient given the option of shower seat being ordered for a cost of $38, but declined. Patient will DC to home with husband who is able to provide 24 hour assistance. [facility]   Action/Plan:  [facility]  Expected Discharge Date:                  Expected Discharge Plan:  Home w Home Health Services  In-House Referral:     Discharge planning Services  CM Consult  Post Acute Care Choice:  Home Health Choice offered to:     DME Arranged:    DME Agency:     HH Arranged:  PT, OT HH Agency:  [facility]  Status of Service:  Completed, signed off  Medicare Important Message Given:    Date Medicare IM Given:    Medicare IM give by:    Date Additional Medicare IM Given:    Additional Medicare Important Message give by:     If discussed at Long Length of Stay Meetings, dates discussed:    Additional Comments:  Lawerance SabalDebbie Bellagrace Sylvan, RN 08/19/2015, 2:24 PM

## 2015-08-21 LAB — CULTURE, BLOOD (ROUTINE X 2)
CULTURE: NO GROWTH
CULTURE: NO GROWTH

## 2015-08-26 ENCOUNTER — Encounter: Payer: Self-pay | Admitting: Oncology

## 2015-08-26 ENCOUNTER — Ambulatory Visit: Payer: Self-pay | Admitting: Neurology

## 2015-09-04 ENCOUNTER — Ambulatory Visit (INDEPENDENT_AMBULATORY_CARE_PROVIDER_SITE_OTHER): Payer: 59 | Admitting: Neurology

## 2015-09-04 ENCOUNTER — Ambulatory Visit (HOSPITAL_COMMUNITY)
Admission: RE | Admit: 2015-09-04 | Discharge: 2015-09-04 | Disposition: A | Discharge status: Home / Self Care | Payer: 59 | Source: Ambulatory Visit | Attending: Neurology | Admitting: Neurology

## 2015-09-04 ENCOUNTER — Encounter: Payer: Self-pay | Admitting: Neurology

## 2015-09-04 VITALS — BP 130/76 | HR 96 | Ht 64.0 in | Wt 231.0 lb

## 2015-09-04 DIAGNOSIS — G9340 Encephalopathy, unspecified: Secondary | ICD-10-CM | POA: Diagnosis not present

## 2015-09-04 DIAGNOSIS — Z79899 Other long term (current) drug therapy: Secondary | ICD-10-CM | POA: Insufficient documentation

## 2015-09-04 DIAGNOSIS — Z794 Long term (current) use of insulin: Secondary | ICD-10-CM | POA: Insufficient documentation

## 2015-09-04 DIAGNOSIS — I6300 Cerebral infarction due to thrombosis of unspecified precerebral artery: Secondary | ICD-10-CM

## 2015-09-04 DIAGNOSIS — E1142 Type 2 diabetes mellitus with diabetic polyneuropathy: Secondary | ICD-10-CM | POA: Diagnosis not present

## 2015-09-04 DIAGNOSIS — E1165 Type 2 diabetes mellitus with hyperglycemia: Secondary | ICD-10-CM

## 2015-09-04 DIAGNOSIS — G43009 Migraine without aura, not intractable, without status migrainosus: Secondary | ICD-10-CM | POA: Diagnosis not present

## 2015-09-04 DIAGNOSIS — Z7902 Long term (current) use of antithrombotics/antiplatelets: Secondary | ICD-10-CM | POA: Diagnosis not present

## 2015-09-04 DIAGNOSIS — R413 Other amnesia: Secondary | ICD-10-CM

## 2015-09-04 MED ORDER — AMITRIPTYLINE HCL 50 MG PO TABS: 50.0000 mg | ORAL_TABLET | Freq: Every day | ORAL | Status: DC

## 2015-09-04 MED ORDER — IBUPROFEN 800 MG PO TABS: 800.0000 mg | ORAL_TABLET | Freq: Three times a day (TID) | ORAL | Status: DC | PRN

## 2015-09-04 NOTE — Progress Notes (Signed)
NEUROLOGY FOLLOW UP OFFICE NOTE  Kelly Romero [id number]  HISTORY OF PRESENT ILLNESS: Kelly Romero is a 50 year old right-handed female with history of uncontrolled diabetes, hypertension and depression who follows up for headache and stroke.  History obtained by patient and hospital notes from [facility].  Imaging of brain CT and MRI from 08/15/15 personally reviewed.  UPDATE: For continued stroke workup, she had a TEE on 04/19/15, which revealed EF 60-65% with no PFO or atrial septal defect or other cardiac source of emboli.  After 3 months on dual antiplatelet therapy, she was continued on Plavix alone.  She was on amitriptyline 65m at bedtime to help with depression and headache.  [facility] for right-sided (however some notes state left-sided) weakness and numbness associated with forgetfulness and agitation.  Blood pressure was elevated.  NIHSS was 4.  Due to minimal symptoms, she did not receive tPA.  Urine tox screen was negative.  CT of head showed no acute findings.  MRI of brain with and without contrast revealed possible small acute infarct in the posterior right MCA territory.  However, it appears on previous imaging, which suggests it is likely an old finding.  Neurology believed it to be an old infarct, as it apparently was seen on a prior MRI in February 2017, which I do not have.  TTE again was unremarkable with EF of 65-70%.  LDL was 231.  Hgb A1c was 10.5.  Exact cause of encephalopathy was unclear.  She underwent an EEG on 08/18/15, which revealed mild to moderate generalized background slowing but no epileptiform activity.  Neurology felt symptoms may be psychogenic.  She was started on Depakote 5039mtwice daily for possible seizures.    She was maintained on ASA 8141mnd Plavix, as well as Rosuvastatin 65m58mShe reports word-finding difficulties, which has been ongoing for several months.  Meanwhile, headaches are somewhat  improved. Intensity:  5-9/10 Duration:  No more than 2 hours with ibuprofen Frequency:  2 days per week Current NSAIDS:  Ibuprofen 800mg44mrent analgesics:  no Current triptans:  no Current anti-emetic:  no Current muscle relaxants:  baclofen 5mg C93ment anti-anxiolytic:  no Current sleep aide:  no Current Antihypertensive medications:  lisinopril Current Antidepressant medications:  amitriptyline 25mg, 38mtalopram 10mg Cu70mt Anticonvulsant medications:  divalproex DR 500mg twi22maily Current Vitamins/Herbal/Supplements:  no Current Antihistamines/Decongestants:  no Other therapy:  no  08/19/15:  Na 138, K 3.4, BUN 36, Cr 1.19, TP 5.7, albumin 2.5, AST 15, ALT 13, Alk Phos 72, TB 0.3, WBC 4.8, HGB 11.9, HCT 37.7, PLT 230.  HISTORY: Onset:  July 2016.  Sudden onset. Location:  Right sided, from front to back of head.  Initially had pain behind right ear. Quality:  squeezing Initial intensity:  7-8/10 Aura:  no Prodrome:  no Associated symptoms:  Some photophobia and phonophobia.  Sometimes nausea.  She denies visual disturbance. Initial Duration:  All day Initial Frequency:  Daily.  Sometimes wakes her up at night Triggers/exacerbating factors:  Loud noise Relieving factors:  Laying in a cool dark quiet room  Past abortive medication:  Tylenol, Excedrin, Percocet Past preventative medication:  none Other past therapy:  none  Current abortive medication:  Ibuprofen 800mg Anti12mrtensive medications:  Lisinopril, metoprolol Antidepressant medications:  lexapro 10mg Antic91mlsant medications:  none Vitamins/Herbal/Supplements:  none Other therapy:  none  Sed Rate was 30.  CBC and CMP were unremarkable except for elevated glucose of 201.  MRI of the brain from 11/09/14 showed chronic small vessel ischemic changes with an incidental tiny subacute infarct in the right frontal lobe.    Encephalopathy/Stroke: She was in [facility] on 12/07/14 for stroke symptoms.  She  developed slurred speech and right arm numbness.   She was agitated, yelling and screaming in the ED.  She was given Ativan and Haldol.  CT of head was unremarkable.  Repeat CT of head two days later was negative.  CXR negative.  Urine tox screen was positive for opioids.  She was found to have severally elevated blood pressure with systolic at 416 and higher.  Hgb A1c was 12 with serum glucose level of 400.  She did not have fever or leukocytosis, and UA was borderline with trace leukocytes but negative nitrite.  She was treated with 5 days of IV Rocephin.  Troponins were negative.  Etiology was unknown but TIA was suspected as a possibility.  She had a similar stroke-like episode on 02/22/15.  She woke up with headache and later developed slurred speech.  She was admitted to [facility], where MRI of brain revealed 2 mm left occipital subacute infarct.  2D echo was performed, which appeared unremarkable, but didn't make mention of a PFO.  Carotid doppler again showed no hemodynamically significant ICA stenosis.  Hypercoagulable panel, ANA, lupus panel, antiextractable nuclear antigen were negative.  LDL was 130.  Plavix was added to her ASA.  Labs from August 2016 include Sed Rate 30, ANA negative, ANCA screen negative, antiextractable nuclear antigen (RNP abs, Smith abs) negative.    PAST MEDICAL HISTORY: Past Medical History  Diagnosis Date  . Diabetes mellitus without complication ([place])   . Headache   . Hypertension   . Hyperlipidemia     MEDICATIONS: Current Outpatient Prescriptions on File Prior to Visit  Medication Sig Dispense Refill  . clopidogrel (PLAVIX) 75 MG tablet Take 75 mg by mouth daily.    Marland Kitchen escitalopram (LEXAPRO) 10 MG tablet Take 10 mg by mouth daily.     . insulin detemir (LEVEMIR) 100 UNIT/ML injection Inject 80 Units into the skin daily.    Marland Kitchen lisinopril (PRINIVIL,ZESTRIL) 2.5 MG tablet Take 2.5 mg by mouth daily.     . rosuvastatin (CRESTOR) 20 MG tablet Take 1  tablet (20 mg total) by mouth at bedtime. 30 tablet 0  . baclofen (LIORESAL) 10 MG tablet Take 0.5 tablets (5 mg total) by mouth 3 (three) times daily. (Patient not taking: Reported on 09/04/2015) 30 each 0  . Empagliflozin-Linagliptin (GLYXAMBI) 10-5 MG TABS Take 1 tablet by mouth daily. Reported on 09/04/2015     No current facility-administered medications on file prior to visit.    ALLERGIES: Allergies  Allergen Reactions  . Erythromycin Itching    FAMILY HISTORY: Family History  Problem Relation Age of Onset  . COPD Mother   . Stroke Father     55s  . Heart failure Brother   . Cancer Maternal Grandmother     unknown   . COPD    . Heart failure Maternal Grandfather   . Hypertension Maternal Grandfather   . Cancer Paternal Grandfather     lung  . Diabetes Paternal Grandmother   . Heart attack Father 2   Her father had "mini strokes" in his 43s.  SOCIAL HISTORY: Social History   Social History  . Marital Status: Married    Spouse Name: N/A  . Number of Children: N/A  . Years of Education: N/A  Occupational History  . Building control surveyor    Social History Main Topics  . Smoking status: Never Smoker   . Smokeless tobacco: Never Used  . Alcohol Use: No     Comment: rare   . Drug Use: No  . Sexual Activity: No   Other Topics Concern  . Not on file   Social History Narrative    REVIEW OF SYSTEMS: Constitutional: No fevers, chills, or sweats, no generalized fatigue, change in appetite Eyes: No visual changes, double vision, eye pain Ear, nose and throat: No hearing loss, ear pain, nasal congestion, sore throat Cardiovascular: No chest pain, palpitations Respiratory:  No shortness of breath at rest or with exertion, wheezes GastrointestinaI: No nausea, vomiting, diarrhea, abdominal pain, fecal incontinence Genitourinary:  No dysuria, urinary retention or frequency Musculoskeletal:  No neck pain, back pain Integumentary: No rash, pruritus, skin  lesions Neurological: as above Psychiatric: depression Endocrine: No palpitations, fatigue, diaphoresis, mood swings, change in appetite, change in weight, increased thirst Hematologic/Lymphatic:  No purpura, petechiae. Allergic/Immunologic: no itchy/runny eyes, nasal congestion, recent allergic reactions, rashes  PHYSICAL EXAM: Filed Vitals:   09/04/15 1056  BP: 130/76  Pulse: 96   General: No acute distress.  Patient appears well-groomed.  Head:  Normocephalic/atraumatic Eyes:  Fundi examined but not visualized Neck: supple, no paraspinal tenderness, full range of motion Heart:  Regular rate and rhythm Lungs:  Clear to auscultation bilaterally Back: No paraspinal tenderness Neurological Exam: alert and oriented to person, place, and time. Attention span and concentration intact, delayed recall poor and remote memory intact, fund of knowledge intact.  Naming fluency poor.  Otherwise, speech fluent and not dysarthric, language intact.   Montreal Cognitive Assessment  09/04/2015  Visuospatial/ Executive (0/5) 4  Naming (0/3) 3  Attention: Read list of digits (0/2) 2  Attention: Read list of letters (0/1) 1  Attention: Serial 7 subtraction starting at 100 (0/3) 2  Language: Repeat phrase (0/2) 0  Language : Fluency (0/1) 0  Abstraction (0/2) 2  Delayed Recall (0/5) 0  Orientation (0/6) 6  Total 20  Adjusted Score (based on education) 20   CN II-XII intact. Bulk and tone normal, muscle strength 5/5 throughout.  Sensation to light touch, temperature and vibration intact.  Deep tendon reflexes 2+ throughout, toes downgoing.  Finger to nose and heel to shin testing intact.  Gait normal, Romberg negative.  IMPRESSION: 1.  Cerebrovascular disease with recurrent strokes.  She has multiple stroke risk factors.  Findings on MRI of the brain does show restricted diffusion.  This is likely an acute on chronic small infarct.  If she did have "left" sided weakness, then this correlates.   Otherwise, it is an incidental finding.  MRI does reveal chronic small vessel disease, moderately advanced for age.   2.  Acute encephalopathy.  Unclear etiology.  Uncertain if it could have been medication effect.  Seizures are possible, as the semiology is recurrent, however they are not typical presentation.  EEG was unremarkable.  It reported very mild slowing, which could be attributed to drowsiness.  Complicated migraine versus stress-related are other possibilities 3.  Migraine without aura 4.  Memory/word-finding difficulties.  May be vascular-related. 5.  Uncontrolled type 2 diabetes 6.  Hyperlipidemia 7.  Hypertension  PLAN: 1.  I would like to get a 24 hour ambulatory EEG.  She will remain on Depakote '500mg'$  twice daily for now. 2.  Increase amitriptyline to '50mg'$  at bedtime to optimize headache control 3.  Ibuprofen '800mg'$  for acute  headache therapy, limited to 2 days per week 4.  Continue Plavix for secondary stroke prevention.  May discontinue ASA.  Combination of dual antiplatelet therapy has not been proven in secondary stroke prevention.  I would not switch to Aggrenox due to headaches. 5.  Crestor 23m daily.  Statin should be managed by PCP.  LDL goal should be less than 70. 6.  Optimize glycemic control 7.  Blood pressure control 8.  Since she had an unexplained episode of loss of awareness,  law states you should not drive until you have not had another event for 6 months. 9.  Follow up in 3 months.  30 minutes spent face to face with patient, over 50% spent discussing diagnosis and management.  AMetta Clines DO  CC:  JLovette Cliche MD

## 2015-09-04 NOTE — Patient Instructions (Addendum)
1.  Continue depakote 500mg  twice daily for now.  Continue Plavix but stop aspirin. 2.  Increase amitriptyline to 50mg  at bedtime 3.  Continue Crestor 20mg  daily 4.  Diabetes control 5.  Will get 24 hour ambulatory EEG 6.  Since you had an unexplained episode of loss of awareness, [place] law states you should not drive until you have not had another event for 6 months. 7.  Would like to refer you for neuropsychological testing to evaluate memory 8.  Follow up in 3 months.

## 2015-09-04 NOTE — Progress Notes (Signed)
Chart forwarded.  

## 2015-09-06 ENCOUNTER — Telehealth: Payer: Self-pay

## 2015-09-06 NOTE — Telephone Encounter (Signed)
-----   Message from Drema DallasAdam R Jaffe, DO sent at 09/06/2015 12:05 PM EDT ----- EEG is normal.  I would like her to remain on depakote for now, since we really don't know the cause of these episodes of confusion.  It helps the headache anyway.  I would like to check CBC and LFTs in 3 months.

## 2015-09-06 NOTE — Procedures (Signed)
24 HOUR AMBULATORY ELECTROENCEPHALOGRAM REPORT  Date of Study: 09/04/2015 to 09/05/2015  Patient's Name: Kelly Romero MRN: [id number] Date of Birth: 02/16/1966  Indication: 50 year old woman with history of strokes and recurrent episodes of right sided weakness and confusion  Medications: clopidogrel (PLAVIX) 75 MG tablet   escitalopram (LEXAPRO) 10 MG tablet Amitriptyline Depakote   insulin detemir (LEVEMIR) 100 UNIT/ML injection   lisinopril (PRINIVIL,ZESTRIL) 2.5 MG tablet   rosuvastatin (CRESTOR) 20 MG tablet  baclofen (LIORESAL) 10 MG tablet  Empagliflozin-Linagliptin (GLYXAMBI) 10-5 MG TABS  Technical Summary: This is a multichannel digital EEG recording, using the international 10-20 placement system with electrodes applied with paste and impedances below 5000 ohms.    Description: The EEG background is symmetric, with a well-developed posterior dominant rhythm of 8-9 Hz, which is reactive to eye opening and closing.  Diffuse beta activity is seen, with a bilateral frontal preponderance.  There are bursts of theta delta in drowsiness but no focal or generalized epileptiform discharges are seen.  Stage II sleep is seen, with normal and symmetric sleep patterns.  ECG revealed normal cardiac rate and rhythm.  Impression: This is a normal 24 hour ambulatory EEG.  [name] habitual spells were not captured during this recording.  A normal study does not rule out the possibility of a seizure disorder in this patient.  Adam R. Everlena CooperJaffe, DO

## 2015-09-06 NOTE — Telephone Encounter (Signed)
Message relayed to patient. Verbalized understanding and denied questions. Reminder set for 3 month lab work.

## 2015-09-19 ENCOUNTER — Encounter (HOSPITAL_COMMUNITY): Payer: Self-pay | Admitting: Emergency Medicine

## 2015-09-19 ENCOUNTER — Emergency Department (HOSPITAL_COMMUNITY): Payer: 59

## 2015-09-19 ENCOUNTER — Emergency Department (HOSPITAL_COMMUNITY)
Admission: EM | Admit: 2015-09-19 | Discharge: 2015-09-19 | Disposition: A | Discharge status: Home / Self Care | Payer: 59 | Attending: Emergency Medicine | Admitting: Emergency Medicine

## 2015-09-19 DIAGNOSIS — Z794 Long term (current) use of insulin: Secondary | ICD-10-CM | POA: Diagnosis not present

## 2015-09-19 DIAGNOSIS — IMO0001 Reserved for inherently not codable concepts without codable children: Secondary | ICD-10-CM

## 2015-09-19 DIAGNOSIS — E1165 Type 2 diabetes mellitus with hyperglycemia: Secondary | ICD-10-CM | POA: Insufficient documentation

## 2015-09-19 DIAGNOSIS — I10 Essential (primary) hypertension: Secondary | ICD-10-CM | POA: Insufficient documentation

## 2015-09-19 DIAGNOSIS — Z7902 Long term (current) use of antithrombotics/antiplatelets: Secondary | ICD-10-CM | POA: Diagnosis not present

## 2015-09-19 DIAGNOSIS — Z7982 Long term (current) use of aspirin: Secondary | ICD-10-CM | POA: Insufficient documentation

## 2015-09-19 DIAGNOSIS — R791 Abnormal coagulation profile: Secondary | ICD-10-CM | POA: Diagnosis not present

## 2015-09-19 DIAGNOSIS — Z8673 Personal history of transient ischemic attack (TIA), and cerebral infarction without residual deficits: Secondary | ICD-10-CM | POA: Diagnosis not present

## 2015-09-19 DIAGNOSIS — R200 Anesthesia of skin: Secondary | ICD-10-CM | POA: Insufficient documentation

## 2015-09-19 DIAGNOSIS — E1365 Other specified diabetes mellitus with hyperglycemia: Secondary | ICD-10-CM

## 2015-09-19 DIAGNOSIS — R4781 Slurred speech: Secondary | ICD-10-CM

## 2015-09-19 HISTORY — DX: Transient cerebral ischemic attack, unspecified: G45.9

## 2015-09-19 LAB — CBC WITH DIFFERENTIAL/PLATELET
BASOS PCT: 0 %
Basophils Absolute: 0 10*3/uL (ref 0.0–0.1)
EOS PCT: 1 %
Eosinophils Absolute: 0.1 10*3/uL (ref 0.0–0.7)
HEMATOCRIT: 37.3 % (ref 36.0–46.0)
Hemoglobin: 12 g/dL (ref 12.0–15.0)
Lymphocytes Relative: 27 %
Lymphs Abs: 1.5 10*3/uL (ref 0.7–4.0)
MCH: 27.5 pg (ref 26.0–34.0)
MCHC: 32.2 g/dL (ref 30.0–36.0)
MCV: 85.4 fL (ref 78.0–100.0)
MONO ABS: 0.3 10*3/uL (ref 0.1–1.0)
MONOS PCT: 5 %
NEUTROS ABS: 3.7 10*3/uL (ref 1.7–7.7)
Neutrophils Relative %: 67 %
PLATELETS: 249 10*3/uL (ref 150–400)
RBC: 4.37 MIL/uL (ref 3.87–5.11)
RDW: 13.1 % (ref 11.5–15.5)
WBC: 5.6 10*3/uL (ref 4.0–10.5)

## 2015-09-19 LAB — I-STAT TROPONIN, ED: TROPONIN I, POC: 0 ng/mL (ref 0.00–0.08)

## 2015-09-19 LAB — I-STAT CHEM 8, ED
BUN: 20 mg/dL (ref 6–20)
CALCIUM ION: 1.21 mmol/L (ref 1.13–1.30)
CHLORIDE: 95 mmol/L — AB (ref 101–111)
Creatinine, Ser: 0.9 mg/dL (ref 0.44–1.00)
GLUCOSE: 496 mg/dL — AB (ref 65–99)
HCT: 37 % (ref 36.0–46.0)
Hemoglobin: 12.6 g/dL (ref 12.0–15.0)
Potassium: 4.3 mmol/L (ref 3.5–5.1)
Sodium: 133 mmol/L — ABNORMAL LOW (ref 135–145)
TCO2: 31 mmol/L (ref 0–100)

## 2015-09-19 LAB — COMPREHENSIVE METABOLIC PANEL
ALK PHOS: 72 U/L (ref 38–126)
ALT: 15 U/L (ref 14–54)
AST: 22 U/L (ref 15–41)
Albumin: 2.8 g/dL — ABNORMAL LOW (ref 3.5–5.0)
Anion gap: 6 (ref 5–15)
BILIRUBIN TOTAL: 0.3 mg/dL (ref 0.3–1.2)
BUN: 17 mg/dL (ref 6–20)
CALCIUM: 9.2 mg/dL (ref 8.9–10.3)
CO2: 29 mmol/L (ref 22–32)
CREATININE: 0.99 mg/dL (ref 0.44–1.00)
Chloride: 98 mmol/L — ABNORMAL LOW (ref 101–111)
Glucose, Bld: 500 mg/dL — ABNORMAL HIGH (ref 65–99)
Potassium: 4.3 mmol/L (ref 3.5–5.1)
Sodium: 133 mmol/L — ABNORMAL LOW (ref 135–145)
TOTAL PROTEIN: 6.1 g/dL — AB (ref 6.5–8.1)

## 2015-09-19 LAB — CBG MONITORING, ED: Glucose-Capillary: 304 mg/dL — ABNORMAL HIGH (ref 65–99)

## 2015-09-19 LAB — PROTIME-INR
INR: 1.07 (ref 0.00–1.49)
Prothrombin Time: 14.1 seconds (ref 11.6–15.2)

## 2015-09-19 LAB — ETHANOL

## 2015-09-19 LAB — APTT: aPTT: 32 seconds (ref 24–37)

## 2015-09-19 MED ORDER — INSULIN ASPART 100 UNIT/ML ~~LOC~~ SOLN
10.0000 [IU] | Freq: Once | SUBCUTANEOUS | Status: AC
  Administered 2015-09-19: 10 [IU] via INTRAVENOUS
  Filled 2015-09-19: qty 1

## 2015-09-19 MED ORDER — IBUPROFEN 800 MG PO TABS
800.0000 mg | ORAL_TABLET | Freq: Once | ORAL | Status: AC
  Administered 2015-09-19: 800 mg via ORAL
  Filled 2015-09-19: qty 1

## 2015-09-19 MED ORDER — SODIUM CHLORIDE 0.9 % IV BOLUS (SEPSIS)
1000.0000 mL | Freq: Once | INTRAVENOUS | Status: AC
  Administered 2015-09-19: 1000 mL via INTRAVENOUS

## 2015-09-19 MED ORDER — HYDROCODONE-ACETAMINOPHEN 5-325 MG PO TABS
2.0000 | ORAL_TABLET | Freq: Once | ORAL | Status: AC
  Administered 2015-09-19: 2 via ORAL
  Filled 2015-09-19: qty 2

## 2015-09-19 NOTE — ED Notes (Signed)
Code Stroke called @ 1120

## 2015-09-19 NOTE — ED Notes (Signed)
Pt states she started having slurred speech and left hand numbness/tingling at 1036 today. Pt denies any pain

## 2015-09-19 NOTE — Code Documentation (Signed)
50yo [id number].  [facility] [place] daughter-in-law was delivering triplets when she had sudden onset of slurred speech and left hand numbness and tingling.  LKW 1036.  Code stroke called in the ED.  Patient to CT.  Stroke team to the bedside.  CT completed.  NIHSS 0, see documentation for details and code stroke times.  Patient with slow, exaggerated speech on exam.  Dr. Roxy Mannsster at the bedside.  No acute stroke treatment at this time.  Code stroke canceled.  Bedside handoff with ED RN Apolinar JunesBrandon.

## 2015-09-19 NOTE — Discharge Instructions (Signed)
Continue your insulin as prescribed.  Keep a record of your blood sugars several times daily over the next week and follow-up with your doctor to review the results.  Return to the emergency department if symptoms significantly worsen or change.   Hyperglycemia Hyperglycemia occurs when the glucose (sugar) in your blood is too high. Hyperglycemia can happen for many reasons, but it most often happens to people who do not know they have diabetes or are not managing their diabetes properly.  CAUSES  Whether you have diabetes or not, there are other causes of hyperglycemia. Hyperglycemia can occur when you have diabetes, but it can also occur in other situations that you might not be as aware of, such as: Diabetes  If you have diabetes and are having problems controlling your blood glucose, hyperglycemia could occur because of some of the following reasons:  Not following your meal plan.  Not taking your diabetes medications or not taking it properly.  Exercising less or doing less activity than you normally do.  Being sick. Pre-diabetes  This cannot be ignored. Before people develop Type 2 diabetes, they almost always have "pre-diabetes." This is when your blood glucose levels are higher than normal, but not yet high enough to be diagnosed as diabetes. Research has shown that some long-term damage to the body, especially the heart and circulatory system, may already be occurring during pre-diabetes. If you take action to manage your blood glucose when you have pre-diabetes, you may delay or prevent Type 2 diabetes from developing. Stress  If you have diabetes, you may be "diet" controlled or on oral medications or insulin to control your diabetes. However, you may find that your blood glucose is higher than usual in the hospital whether you have diabetes or not. This is often referred to as "stress hyperglycemia." Stress can elevate your blood glucose. This happens because of hormones put  out by the body during times of stress. If stress has been the cause of your high blood glucose, it can be followed regularly by your caregiver. That way he/she can make sure your hyperglycemia does not continue to get worse or progress to diabetes. Steroids  Steroids are medications that act on the infection fighting system (immune system) to block inflammation or infection. One side effect can be a rise in blood glucose. Most people can produce enough extra insulin to allow for this rise, but for those who cannot, steroids make blood glucose levels go even higher. It is not unusual for steroid treatments to "uncover" diabetes that is developing. It is not always possible to determine if the hyperglycemia will go away after the steroids are stopped. A special blood test called an A1c is sometimes done to determine if your blood glucose was elevated before the steroids were started. SYMPTOMS  Thirsty.  Frequent urination.  Dry mouth.  Blurred vision.  Tired or fatigue.  Weakness.  Sleepy.  Tingling in feet or leg. DIAGNOSIS  Diagnosis is made by monitoring blood glucose in one or all of the following ways:  A1c test. This is a chemical found in your blood.  Fingerstick blood glucose monitoring.  Laboratory results. TREATMENT  First, knowing the cause of the hyperglycemia is important before the hyperglycemia can be treated. Treatment may include, but is not be limited to:  Education.  Change or adjustment in medications.  Change or adjustment in meal plan.  Treatment for an illness, infection, etc.  More frequent blood glucose monitoring.  Change in exercise plan.  Decreasing  or stopping steroids.  Lifestyle changes. HOME CARE INSTRUCTIONS   Test your blood glucose as directed.  Exercise regularly. Your caregiver will give you instructions about exercise. Pre-diabetes or diabetes which comes on with stress is helped by exercising.  Eat wholesome, balanced meals.  Eat often and at regular, fixed times. Your caregiver or nutritionist will give you a meal plan to guide your sugar intake.  Being at an ideal weight is important. If needed, losing as little as 10 to 15 pounds may help improve blood glucose levels. SEEK MEDICAL CARE IF:   You have questions about medicine, activity, or diet.  You continue to have symptoms (problems such as increased thirst, urination, or weight gain). SEEK IMMEDIATE MEDICAL CARE IF:   You are vomiting or have diarrhea.  Your breath smells fruity.  You are breathing faster or slower.  You are very sleepy or incoherent.  You have numbness, tingling, or pain in your feet or hands.  You have chest pain.  Your symptoms get worse even though you have been following your caregiver's orders.  If you have any other questions or concerns.   This information is not intended to replace advice given to you by your health care provider. Make sure you discuss any questions you have with your health care provider.   Document Released: 08/26/2000 Document Revised: 05/25/2011 Document Reviewed: 11/06/2014 Elsevier Interactive Patient Education [employer]2016 [employer]

## 2015-09-19 NOTE — ED Provider Notes (Signed)
CSN: [id number]     Arrival date & time 09/19/15  1108 History   First MD Initiated Contact with Patient 09/19/15 1110     Chief Complaint  Patient presents with  . Code Stroke     (Consider location/radiation/quality/duration/timing/severity/associated sxs/prior Treatment) HPI Comments: Patient is a 50 year old female with poorly controlled diabetes, obesity, and prior TIA. She presents today for evaluation of slurred speech and numbness that began at approximately 10:30 this morning. She was at [facility] attending a doctor's appointment with her daughter-in-law who is apparently expecting. While there, she developed the above symptoms. She felt numbness to the left side of her face and left hand, and states that her speech became slurred. 911 was called and the patient was transported here. She was found to have a blood sugar in excess of 500 and was also markedly hypertensive, however this hypertension has improved.  She denies to me she has had any recent illnesses. She does report feeling somewhat dizzy at home this morning prior to leaving for the appointment.  The history is provided by the patient.    Past Medical History  Diagnosis Date  . Diabetes mellitus without complication (HCC)   . Headache   . Hypertension   . Hyperlipidemia    Past Surgical History  Procedure Laterality Date  . Cholecystectomy    . Hernia repair    . Appendectomy    . Foot ganglion excision    . Vaginal hysterectomy    . Tee without cardioversion N/A 04/19/2015    Procedure: TRANSESOPHAGEAL ECHOCARDIOGRAM (TEE);  Surgeon: Jake BatheMark C Skains, MD;  [facility];  Service: Cardiovascular;  Laterality: N/A;   Family History  Problem Relation Age of Onset  . COPD Mother   . Stroke Father     3850s  . Heart failure Brother   . Cancer Maternal Grandmother     unknown   . COPD    . Heart failure Maternal Grandfather   . Hypertension Maternal Grandfather   . Cancer Paternal Grandfather      lung  . Diabetes Paternal Grandmother   . Heart attack Father [id number]   Social History  Substance Use Topics  . Smoking status: Never Smoker   . Smokeless tobacco: Never Used  . Alcohol Use: No     Comment: rare    OB History    No data available     Review of Systems  All other systems reviewed and are negative.     Allergies  Erythromycin  Home Medications   Prior to Admission medications   Medication Sig Start Date End Date Taking? Authorizing Provider  amitriptyline (ELAVIL) 50 MG tablet Take 1 tablet (50 mg total) by mouth at bedtime. 09/04/15   Drema DallasAdam R Jaffe, DO  aspirin EC 81 MG tablet Take 81 mg by mouth daily.    Historical Provider, MD  baclofen (LIORESAL) 10 MG tablet Take 0.5 tablets (5 mg total) by mouth 3 (three) times daily. Patient not taking: Reported on 09/04/2015 08/19/15   Beather Arbourushil V Patel, MD  clopidogrel (PLAVIX) 75 MG tablet Take 75 mg by mouth daily.    Historical Provider, MD  Empagliflozin-Linagliptin (GLYXAMBI) 10-5 MG TABS Take 1 tablet by mouth daily. Reported on 09/04/2015    Historical Provider, MD  escitalopram (LEXAPRO) 10 MG tablet Take 10 mg by mouth daily.     Historical Provider, MD  ibuprofen (ADVIL,MOTRIN) 800 MG tablet Take 1 tablet (800 mg total) by mouth every 8 (eight) hours as  needed. 09/04/15   Drema DallasAdam R Jaffe, DO  insulin detemir (LEVEMIR) 100 UNIT/ML injection Inject 80 Units into the skin daily.    Historical Provider, MD  lisinopril (PRINIVIL,ZESTRIL) 2.5 MG tablet Take 2.5 mg by mouth daily.     Historical Provider, MD  rosuvastatin (CRESTOR) 20 MG tablet Take 1 tablet (20 mg total) by mouth at bedtime. 08/19/15   Darreld McleanVishal Patel, MD   BP 152/93 mmHg  Pulse 93  Temp(Src) 98 F (36.7 C)  Ht 5\' 4"  (1.626 m)  Wt 231 lb (104.781 kg)  BMI 39.63 kg/m2  SpO2 99% Physical Exam  Constitutional: She is oriented to person, place, and time. She appears well-developed and well-nourished. No distress.  HENT:  Head: Normocephalic and atraumatic.  Eyes:  EOM are normal. Pupils are equal, round, and reactive to light.  Neck: Normal range of motion. Neck supple.  Cardiovascular: Normal rate and regular rhythm.  Exam reveals no gallop and no friction rub.   No murmur heard. Pulmonary/Chest: Effort normal and breath sounds normal. No respiratory distress. She has no wheezes.  Abdominal: Soft. Bowel sounds are normal. She exhibits no distension. There is no tenderness.  Musculoskeletal: Normal range of motion.  Neurological: She is alert and oriented to person, place, and time. No cranial nerve deficit. She exhibits normal muscle tone. Coordination normal.  Skin: Skin is warm and dry. She is not diaphoretic.  Nursing note and vitals reviewed.   ED Course  Procedures (including critical care time) Labs Review Labs Reviewed - No data to display  Imaging Review No results found. I have personally reviewed and evaluated these images and lab results as part of my medical decision-making.   EKG Interpretation   Date/Time:  Thursday September 19 2015 11:20:54 EDT Ventricular Rate:  88 PR Interval:    QRS Duration: 89 QT Interval:  356 QTC Calculation: 431 R Axis:   67 Text Interpretation:  Sinus rhythm Borderline T wave abnormalities  Confirmed by Jahmal Dunavant  MD, Carmelina Balducci ([id number]) on 09/19/2015 2:19:28 PM      MDM   Final diagnoses:  None    Patient arrived here initially as a code stroke complaining of left-sided facial numbness and slurred speech. She was immediately evaluated by myself and neurology. Neurology did not feel as though her symptoms were related to a stroke, however were more likely related to her blood sugar, blood pressure, and stress. She was evaluated by Dr. Roxy Mannsster.   Her workup reveals a blood sugar of greater than 500, however no evidence for DKA. She was hydrated, given NovoLog, and her sugars have improved. Her symptoms have completely resolved in the emergency department and I feel as though she is appropriate for  discharge.    Geoffery Lyonsouglas Christianjames Soule, MD 09/19/15 1447

## 2015-09-19 NOTE — ED Notes (Addendum)
Glucose 304

## 2015-09-20 NOTE — Consult Note (Signed)
Neurology Consult Note  Reason for Consultation: Slurred speech and numbness  Requesting provider: Geoffery Lyonsouglas DeLo  CC: Left face and hand numbness, difficulty speaking  HPI: This is a 50 year old right-handed woman who presents to the emergency department for evaluation of numbness in her left hand and face as well as some slurred speech. She says that this morning she went to a doctor's appointment with her daughter-in-law who is presently expecting triplets. During the appointment, she developed some tingling in her left hand and her left face. She noticed that when she tried to speak, her speech sounded slurred. EMS was called to the doctor's office and she was brought to the emergency department. On arrival in the ED, she continued to complain of similar symptoms. Code stroke was activated. She was last known to be well at 1036 and arrived in the ED at 1108. She went for emergent CT scan of the head which did not reveal any abnormalities. NIH stroke scale score was 0 and she demonstrated no symptoms at the time of her assessment. The code stroke was therefore canceled as she was no longer a candidate for thrombolytic therapy because of complete resolution of her symptoms.  PMH:  Past Medical History  Diagnosis Date  . Diabetes mellitus without complication (HCC)   . Headache   . Hypertension   . Hyperlipidemia   . TIA (transient ischemic attack)     PSH:  Past Surgical History  Procedure Laterality Date  . Cholecystectomy    . Hernia repair    . Appendectomy    . Foot ganglion excision    . Vaginal hysterectomy    . Tee without cardioversion N/A 04/19/2015    Procedure: TRANSESOPHAGEAL ECHOCARDIOGRAM (TEE);  Surgeon: Jake BatheMark C Skains, MD;  [facility];  Service: Cardiovascular;  Laterality: N/A;    Family history: Family History  Problem Relation Age of Onset  . COPD Mother   . Stroke Father     3850s  . Heart failure Brother   . Cancer Maternal Grandmother     unknown    . COPD    . Heart failure Maternal Grandfather   . Hypertension Maternal Grandfather   . Cancer Paternal Grandfather     lung  . Diabetes Paternal Grandmother   . Heart attack Father [id number]    Social history:  Social History   Social History  . Marital Status: Married    Spouse Name: N/A  . Number of Children: N/A  . Years of Education: N/A   Occupational History  . Data processing managerAssistant Teacher    Social History Main Topics  . Smoking status: Never Smoker   . Smokeless tobacco: Never Used  . Alcohol Use: No     Comment: rare   . Drug Use: No  . Sexual Activity: No   Other Topics Concern  . Not on file   Social History Narrative    Current outpatient meds: Current Meds  Medication Sig  . amitriptyline (ELAVIL) 50 MG tablet Take 1 tablet (50 mg total) by mouth at bedtime.  . clopidogrel (PLAVIX) 75 MG tablet Take 75 mg by mouth daily.  . divalproex (DEPAKOTE ER) 250 MG 24 hr tablet Take 250 mg by mouth daily.  Marland Kitchen. escitalopram (LEXAPRO) 10 MG tablet Take 10 mg by mouth daily.   Marland Kitchen. ibuprofen (ADVIL,MOTRIN) 800 MG tablet Take 1 tablet (800 mg total) by mouth every 8 (eight) hours as needed.  . insulin detemir (LEVEMIR) 100 UNIT/ML injection Inject 80 Units into the  skin daily.  Marland Kitchen. lisinopril (PRINIVIL,ZESTRIL) 30 MG tablet Take 30 mg by mouth daily.    Current inpatient meds:  No current facility-administered medications for this encounter.   Current Outpatient Prescriptions  Medication Sig Dispense Refill  . amitriptyline (ELAVIL) 50 MG tablet Take 1 tablet (50 mg total) by mouth at bedtime. 30 tablet 2  . clopidogrel (PLAVIX) 75 MG tablet Take 75 mg by mouth daily.    . divalproex (DEPAKOTE ER) 250 MG 24 hr tablet Take 250 mg by mouth daily.    Marland Kitchen. escitalopram (LEXAPRO) 10 MG tablet Take 10 mg by mouth daily.     Marland Kitchen. ibuprofen (ADVIL,MOTRIN) 800 MG tablet Take 1 tablet (800 mg total) by mouth every 8 (eight) hours as needed. 30 tablet 2  . insulin detemir (LEVEMIR) 100 UNIT/ML  injection Inject 80 Units into the skin daily.    Marland Kitchen. lisinopril (PRINIVIL,ZESTRIL) 30 MG tablet Take 30 mg by mouth daily.    . baclofen (LIORESAL) 10 MG tablet Take 0.5 tablets (5 mg total) by mouth 3 (three) times daily. (Patient not taking: Reported on 09/04/2015) 30 each 0  . rosuvastatin (CRESTOR) 20 MG tablet Take 1 tablet (20 mg total) by mouth at bedtime. (Patient not taking: Reported on 09/19/2015) 30 tablet 0    Allergies: Allergies  Allergen Reactions  . Erythromycin Itching    ROS: As per HPI. A full 14-point review of systems was performed and is otherwise Notable for significant stress in her life. She feels overwhelmed and indeed becomes tearful at times during the course of this evaluation. She is not endorsing any other physical complaints.  PE:  BP 186/70 mmHg  Pulse 82  Temp(Src) 98 F (36.7 C)  Resp 15  Ht 5\' 4"  (1.626 m)  Wt 104.781 kg (231 lb)  BMI 39.63 kg/m2  SpO2 99%  General: WDWN, occasionally tearful but otherwise no acute distress.Marland Kitchen. AAO x4. Speech clear, no dysarthria. No aphasia. Follows commands briskly. Affect is restricted with depressed mood. Comportment is normal.  HEENT: Normocephalic. Neck supple without LAD. MMM, OP clear. Dentition good. Sclerae anicteric. No conjunctival injection.  CV: Regular, no murmur. Carotid pulses full and symmetric, no bruits. Distal pulses 2+ and symmetric.  Lungs: CTAB.  Abdomen: Soft, non-distended, non-tender. Bowel sounds present x4.  Extremities: No C/C/E. Neuro:  CN: Pupils are equal and round. They are symmetrically reactive from 3-->2 mm. EOMI without nystagmus. No reported diplopia. Facial sensation is intact to light touch. Face is symmetric at rest with normal strength an mobility. Hearing is intact to conversational voice. Palate elevates symmetrically and uvula is midline. Voice is normal in tone, pitch and quality. Bilateral SCM and trapezii are 5/5. Tongue is midline with normal bulk and mobility.  Motor:  Normal bulk, tone, and strength. No tremor or other abnormal movements. No drift.  Sensation: intact to light touch, pinprick, vibration, and joint position.  Coordination: Finger-to-nose and heel-to-shin are without dysmetria. Finger taps are normal in amplitude and speed, no decrement.   Labs:  Lab Results  Component Value Date   WBC 5.6 09/19/2015   HGB 12.6 09/19/2015   HCT 37.0 09/19/2015   PLT 249 09/19/2015   GLUCOSE 496* 09/19/2015   CHOL 307* 08/16/2015   TRIG 193* 08/16/2015   HDL 37* 08/16/2015   LDLCALC 231* 08/16/2015   ALT 15 09/19/2015   AST 22 09/19/2015   NA 133* 09/19/2015   K 4.3 09/19/2015   CL 95* 09/19/2015   CREATININE 0.90 09/19/2015  BUN 20 09/19/2015   CO2 29 09/19/2015   INR 1.07 09/19/2015   HGBA1C 10.5* 08/15/2015    Imaging:  I have personally and independently reviewed the CT scan of the head without contrast obtained during this visit. This shows a moderate degree of chronic small vessel ischemic change in the bihemispheric white matter. 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 A chronic ischemic infarction is also seen in the left posterior temporal lobe extending into the left occipital lobe. No acute abnormality is present.  Assessment and Plan:  1. Left face and hand numbness: This is not consistent with an acute ischemic stroke. She has had complete resolution of symptoms. I suspect that her symptoms were related to overwhelming stress with other contributions likely from her elevated blood pressure and extremely elevated blood glucose. At this time, no further evaluation for stroke is warranted. She should follow up closely with her primary care provider to help improve her blood sugar and her blood pressure. She may consider referral to a counselor or a therapist for stress management and anxiety control.  2. Cerebrovascular disease: She has clear evidence of chronic small vessel disease as well as prior infarctions  on her imaging. Her known risk factors for cerebrovascular disease include diabetes, hypertension, hyperlipidemia, prior stroke, and obesity. She has been taking aspirin and Plavix at home and he should be continued. She should continue her statin. Ensure aggressive control of her risk factors. Goal LDL should be less than 70, goal systolic blood pressure less than 140.  This was discussed with the patient and her husband. They are in agreement with the plan as stated. There are given the opportunity to ask any questions and these were answered to their satisfaction. The above impression was also discussed with the emergency room physician at the time of consultation.  Rhona Leavens, M.D. Neurology

## 2015-09-28 ENCOUNTER — Encounter: Payer: Self-pay | Admitting: Emergency Medicine

## 2015-09-28 ENCOUNTER — Emergency Department
Admission: EM | Admit: 2015-09-28 | Discharge: 2015-09-28 | Disposition: A | Discharge status: Home / Self Care | Payer: 59 | Attending: Emergency Medicine | Admitting: Emergency Medicine

## 2015-09-28 ENCOUNTER — Emergency Department: Payer: 59

## 2015-09-28 DIAGNOSIS — Z791 Long term (current) use of non-steroidal anti-inflammatories (NSAID): Secondary | ICD-10-CM | POA: Diagnosis not present

## 2015-09-28 DIAGNOSIS — Z794 Long term (current) use of insulin: Secondary | ICD-10-CM | POA: Diagnosis not present

## 2015-09-28 DIAGNOSIS — Z79899 Other long term (current) drug therapy: Secondary | ICD-10-CM | POA: Insufficient documentation

## 2015-09-28 DIAGNOSIS — E785 Hyperlipidemia, unspecified: Secondary | ICD-10-CM | POA: Diagnosis not present

## 2015-09-28 DIAGNOSIS — R51 Headache: Secondary | ICD-10-CM | POA: Diagnosis present

## 2015-09-28 DIAGNOSIS — Z5181 Encounter for therapeutic drug level monitoring: Secondary | ICD-10-CM | POA: Insufficient documentation

## 2015-09-28 DIAGNOSIS — Z8673 Personal history of transient ischemic attack (TIA), and cerebral infarction without residual deficits: Secondary | ICD-10-CM | POA: Diagnosis not present

## 2015-09-28 DIAGNOSIS — R519 Headache, unspecified: Secondary | ICD-10-CM

## 2015-09-28 DIAGNOSIS — I161 Hypertensive emergency: Secondary | ICD-10-CM | POA: Diagnosis not present

## 2015-09-28 DIAGNOSIS — E119 Type 2 diabetes mellitus without complications: Secondary | ICD-10-CM | POA: Insufficient documentation

## 2015-09-28 LAB — COMPREHENSIVE METABOLIC PANEL
ALT: 13 U/L — AB (ref 14–54)
AST: 17 U/L (ref 15–41)
Albumin: 3.1 g/dL — ABNORMAL LOW (ref 3.5–5.0)
Alkaline Phosphatase: 74 U/L (ref 38–126)
Anion gap: 7 (ref 5–15)
BUN: 19 mg/dL (ref 6–20)
CHLORIDE: 96 mmol/L — AB (ref 101–111)
CO2: 31 mmol/L (ref 22–32)
CREATININE: 0.75 mg/dL (ref 0.44–1.00)
Calcium: 9.2 mg/dL (ref 8.9–10.3)
GFR calc Af Amer: >60 mL/min (ref >60)
GFR calc non Af Amer: >60 mL/min (ref >60)
Glucose, Bld: 237 mg/dL — ABNORMAL HIGH (ref 65–99)
Potassium: 4.3 mmol/L (ref 3.5–5.1)
SODIUM: 134 mmol/L — AB (ref 135–145)
Total Bilirubin: 0.6 mg/dL (ref 0.3–1.2)
Total Protein: 6.6 g/dL (ref 6.5–8.1)

## 2015-09-28 LAB — APTT: aPTT: 32 seconds (ref 24–36)

## 2015-09-28 LAB — URINALYSIS COMPLETE WITH MICROSCOPIC (ARMC ONLY)
BILIRUBIN URINE: NEGATIVE
Glucose, UA: 150 mg/dL — AB
KETONES UR: NEGATIVE mg/dL
Leukocytes, UA: NEGATIVE
Nitrite: NEGATIVE
PROTEIN: 100 mg/dL — AB
Specific Gravity, Urine: 1.009 (ref 1.005–1.030)
pH: 6 (ref 5.0–8.0)

## 2015-09-28 LAB — CBC WITH DIFFERENTIAL/PLATELET
Basophils Absolute: 0 10*3/uL (ref 0–0.1)
Basophils Relative: 0 %
EOS PCT: 2 %
Eosinophils Absolute: 0.1 10*3/uL (ref 0–0.7)
HCT: 37.7 % (ref 35.0–47.0)
Hemoglobin: 13 g/dL (ref 12.0–16.0)
LYMPHS ABS: 1.6 10*3/uL (ref 1.0–3.6)
LYMPHS PCT: 33 %
MCH: 28.7 pg (ref 26.0–34.0)
MCHC: 34.4 g/dL (ref 32.0–36.0)
MCV: 83.3 fL (ref 80.0–100.0)
MONO ABS: 0.3 10*3/uL (ref 0.2–0.9)
MONOS PCT: 6 %
Neutro Abs: 2.8 10*3/uL (ref 1.4–6.5)
Neutrophils Relative %: 59 %
PLATELETS: 211 10*3/uL (ref 150–440)
RBC: 4.53 MIL/uL (ref 3.80–5.20)
RDW: 13.5 % (ref 11.5–14.5)
WBC: 4.8 10*3/uL (ref 3.6–11.0)

## 2015-09-28 LAB — PROTIME-INR
INR: 0.97
Prothrombin Time: 13.1 seconds (ref 11.4–15.0)

## 2015-09-28 MED ORDER — HYDRALAZINE HCL 20 MG/ML IJ SOLN
10.0000 mg | Freq: Once | INTRAMUSCULAR | Status: AC
  Administered 2015-09-28: 10 mg via INTRAVENOUS
  Filled 2015-09-28: qty 1

## 2015-09-28 MED ORDER — METOCLOPRAMIDE HCL 5 MG/ML IJ SOLN
10.0000 mg | Freq: Once | INTRAMUSCULAR | Status: AC
  Administered 2015-09-28: 10 mg via INTRAVENOUS
  Filled 2015-09-28: qty 2

## 2015-09-28 MED ORDER — HYDROCHLOROTHIAZIDE 25 MG PO TABS: 25.0000 mg | ORAL_TABLET | Freq: Every day | ORAL | Status: DC

## 2015-09-28 MED ORDER — KETOROLAC TROMETHAMINE 30 MG/ML IJ SOLN
30.0000 mg | Freq: Once | INTRAMUSCULAR | Status: AC
  Administered 2015-09-28: 30 mg via INTRAVENOUS
  Filled 2015-09-28: qty 1

## 2015-09-28 MED ORDER — HYDROCHLOROTHIAZIDE 25 MG PO TABS
25.0000 mg | ORAL_TABLET | Freq: Every day | ORAL | Status: DC
  Administered 2015-09-28: 25 mg via ORAL
  Filled 2015-09-28: qty 1

## 2015-09-28 MED ORDER — DIPHENHYDRAMINE HCL 50 MG/ML IJ SOLN
50.0000 mg | Freq: Once | INTRAMUSCULAR | Status: AC
  Administered 2015-09-28: 50 mg via INTRAVENOUS
  Filled 2015-09-28: qty 1

## 2015-09-28 MED ORDER — FENTANYL CITRATE (PF) 100 MCG/2ML IJ SOLN
25.0000 ug | Freq: Once | INTRAMUSCULAR | Status: AC
  Administered 2015-09-28: 25 ug via INTRAVENOUS
  Filled 2015-09-28: qty 2

## 2015-09-28 NOTE — ED Provider Notes (Signed)
[facility] Emergency Department Provider Note  ____________________________________________  Time seen: Approximately 9:57 AM  I have reviewed the triage vital signs and the nursing notes.   HISTORY  Chief Complaint Headache and Aphasia   HPI Kelly Romero is a 50 y.o. female history of CVA on Plavix and aspirin, diabetes, hyperlipidemia, and hypertension who presents for evaluation of headache and slurred speech. Patient reports that at 9:30 AM this morning she started having a severe thunderclap headache. The headache is diffuse, sharp, non radiating, and initially was associated with bilateral facial numbness, slurred speech, and right upper extremity numbness. Patient reports that her neurological deficits have now resolved although she still has a severe headache. She denies blurry vision, nausea, vomiting, chest pain, shortness of breath, abdominal pain. Patient has had episodes of severe headache in the past with neurological deficits that resolved with no evidence of stroke. She endorses compliance with her medications.  Past Medical History  Diagnosis Date  . Diabetes mellitus without complication (HCC)   . Headache   . Hypertension   . Hyperlipidemia   . TIA (transient ischemic attack)     Patient Active Problem List   Diagnosis Date Noted  . Altered mental status   . Stroke-like symptoms   . Acute encephalopathy 08/17/2015  . Anxiety and depression   . CVA (cerebrovascular accident due to intracerebral hemorrhage) (HCC) 08/15/2015  . Encephalopathy, metabolic 12/28/2014  . 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 Essential hypertension 12/28/2014  . New onset headache 11/02/2014    Past Surgical History  Procedure Laterality Date  . Cholecystectomy    . Hernia repair    . Appendectomy    . Foot ganglion excision    . Vaginal hysterectomy    . Tee without cardioversion N/A 04/19/2015      Procedure: TRANSESOPHAGEAL ECHOCARDIOGRAM (TEE);  Surgeon: Jake Bathe, MD;  Location: [facility] ENDOSCOPY;  Service: Cardiovascular;  Laterality: N/A;    Current Outpatient Rx  Name  Route  Sig  Dispense  Refill  . amitriptyline (ELAVIL) 25 MG tablet   Oral   Take 25 mg by mouth 2 (two) times daily.         . clopidogrel (PLAVIX) 75 MG tablet   Oral   Take 75 mg by mouth daily.         . divalproex (DEPAKOTE) 500 MG DR tablet   Oral   Take 500 mg by mouth daily.         . Empagliflozin-Linagliptin (GLYXAMBI) 10-5 MG TABS   Oral   Take 1 tablet by mouth daily.         Marland Kitchen escitalopram (LEXAPRO) 10 MG tablet   Oral   Take 10 mg by mouth daily.          Marland Kitchen ibuprofen (ADVIL,MOTRIN) 800 MG tablet   Oral   Take 1 tablet (800 mg total) by mouth every 8 (eight) hours as needed.   30 tablet   2   . insulin detemir (LEVEMIR) 100 UNIT/ML injection   Subcutaneous   Inject 80 Units into the skin every morning.          Marland Kitchen lisinopril (PRINIVIL,ZESTRIL) 30 MG tablet   Oral   Take 30 mg by mouth daily.         . rosuvastatin (CRESTOR) 10 MG tablet   Oral   Take 10 mg by mouth 2 (two) times daily.         Marland Kitchen  amitriptyline (ELAVIL) 50 MG tablet   Oral   Take 1 tablet (50 mg total) by mouth at bedtime.   30 tablet   2   . baclofen (LIORESAL) 10 MG tablet   Oral   Take 0.5 tablets (5 mg total) by mouth 3 (three) times daily. Patient not taking: Reported on 09/04/2015   30 each   0   . hydrochlorothiazide (HYDRODIURIL) 25 MG tablet   Oral   Take 1 tablet (25 mg total) by mouth daily.   30 tablet   1   . rosuvastatin (CRESTOR) 20 MG tablet   Oral   Take 1 tablet (20 mg total) by mouth at bedtime. Patient not taking: Reported on 09/19/2015   30 tablet   0     Allergies Erythromycin  Family History  Problem Relation Age of Onset  . COPD Mother   . Stroke Father     6050s  . Heart failure Brother   . Cancer Maternal Grandmother     unknown   . COPD    .  Heart failure Maternal Grandfather   . Hypertension Maternal Grandfather   . Cancer Paternal Grandfather     lung  . Diabetes Paternal Grandmother   . Heart attack Father 1463    Social History Social History  Substance Use Topics  . Smoking status: Never Smoker   . Smokeless tobacco: Never Used  . Alcohol Use: No     Comment: rare     Review of Systems  Constitutional: Negative for fever. Eyes: Negative for visual changes. ENT: Negative for sore throat. Cardiovascular: Negative for chest pain. Respiratory: Negative for shortness of breath. Gastrointestinal: Negative for abdominal pain, vomiting or diarrhea. Genitourinary: Negative for dysuria. Musculoskeletal: Negative for back pain. Skin: Negative for rash. Neurological: + headaches, facial and RUE numbness and slurred speech. No weakness. ____________________________________________   PHYSICAL EXAM:  VITAL SIGNS: ED Triage Vitals  Enc Vitals Group     BP 09/28/15 0953 223/117 mmHg     Pulse Rate 09/28/15 0953 96     Resp 09/28/15 0953 18     Temp 09/28/15 0953 98.3 F (36.8 C)     Temp Source 09/28/15 0953 Oral     SpO2 09/28/15 0953 98 %     Weight 09/28/15 0953 233 lb (105.688 kg)     Height 09/28/15 0953 5\' 5"  (1.651 m)     Head Cir --      Peak Flow --      Pain Score --      Pain Loc --      Pain Edu? --      Excl. in GC? --     Constitutional: Alert and oriented, crying and in mild distress.  HEENT:      Head: Normocephalic and atraumatic.         Eyes: Conjunctivae are normal. Sclera is non-icteric. EOMI. PERRL      Mouth/Throat: Mucous membranes are moist.       Neck: Supple with no signs of meningismus. Cardiovascular: Regular rate and rhythm. No murmurs, gallops, or rubs. 2+ symmetrical distal pulses are present in all extremities. No JVD. Respiratory: Normal respiratory effort. Lungs are clear to auscultation bilaterally. No wheezes, crackles, or rhonchi.  Gastrointestinal: Soft, non tender,  and non distended with positive bowel sounds. No rebound or guarding. Genitourinary: No CVA tenderness. Musculoskeletal: Nontender with normal range of motion in all extremities. No edema, cyanosis, or erythema of extremities. Neurologic: A & O  x3, PERRL, no nystagmus, CN II-XII intact, motor testing reveals good tone and bulk throughout. There is no evidence of pronator drift or dysmetria. Muscle strength is 5/5 throughout. Deep tendon reflexes are 2+ throughout with downgoing toes. Sensory examination is intact. Gait deferred.Normal speech and language. NIHSS 0 Skin: Skin is warm, dry and intact. No rash noted. Psychiatric: Mood and affect are normal. Speech and behavior are normal.  ____________________________________________   LABS (all labs ordered are listed, but only abnormal results are displayed)  Labs Reviewed  COMPREHENSIVE METABOLIC PANEL - Abnormal; Notable for the following:    Sodium 134 (*)    Chloride 96 (*)    Glucose, Bld 237 (*)    Albumin 3.1 (*)    ALT 13 (*)    All other components within normal limits  URINALYSIS COMPLETEWITH MICROSCOPIC ([facility] ONLY) - Abnormal; Notable for the following:    Color, Urine YELLOW (*)    APPearance CLEAR (*)    Glucose, UA 150 (*)    Hgb urine dipstick 2+ (*)    Protein, ur 100 (*)    Bacteria, UA RARE (*)    Squamous Epithelial / LPF 0-5 (*)    All other components within normal limits  APTT  CBC WITH DIFFERENTIAL/PLATELET  PROTIME-INR   ____________________________________________  EKG  ED ECG REPORT I, [name], the attending physician, personally viewed and interpreted this ECG.  Normal sinus rhythm, rate of 82, normal intervals, normal axis, no ST elevations or depressions, T-wave inversion in aVL. New from prior.  ____________________________________________  RADIOLOGY  Head CT:  Negative ____________________________________________   PROCEDURES  Procedure(s) performed: None Critical Care  performed:  None ____________________________________________   INITIAL IMPRESSION / ASSESSMENT AND PLAN / ED COURSE  50 y.o. female history of CVA on Plavix and aspirin, diabetes, hyperlipidemia, and hypertension who presents for evaluation of headache and slurred speech. NIHSS on arrival 0 and patient complaining initially of b/l facial numbness making stroke less likely. BP in the 230s. Neuro intact. On plavix and ASA. No code stroke was called. Patient taken straight to CT scan. Will check labs, CT head, control pain and BP. ddx SAH vs TIA vs hypertensive emergency vs metabolic encephalopathy.  _________________________ 10:21 AM on 09/28/2015 -----------------------------------------  Patient Returned from CT scan. R and evaluated patient was complaining of left-sided lower face numbness. I reevaluated the patient was now complaining of right lower face numbness. Her exam remaining consistent with that of a stroke. Her CT read is pending. [facility] consult neurology.  _________________________ 11:56 AM on 09/28/2015 -----------------------------------------  Patient awaiting Neurology consult. Head CT negative. BP improving. Will treat HA with IV Reglan and IV toradol. She remains neuro intact.  ----------------------------------------- 12:28 PM on 09/28/2015 -----------------------------------------  Patient evaluated by Dr. Robb Matar via tele-neurology who recommended treating patient's headache which I have already ordered Toradol and Reglan and also treating patient's blood pressure. We'll order IV hydralazine and reassessed. Patient remains neuro intact. No indications for TIA workup based on my evaluation and Dr. Donato Heinz evaluation. Presentation concerning for HTN urgency/ emergency  _________________________ 12:50 PM on 09/28/2015 -----------------------------------------  BP 153/65. Patient reports her HA is markedly improved. No longer having slurred speech per patient. Will start  patient on HCTZ and watch for another 30 min to make sure BP does not go back up.  _________________________ 2:36 PM on 09/28/2015 -----------------------------------------  HA Foley resolved. Patient was started on hydrochlorothiazide here. Remains neurologically intact. We'll discharge her home with a prescription for hydrochlorothiazide, continue lisinopril,  follow-up with primary care doctor. Discussed return precautions with patient and her daughter.  Pertinent labs & imaging results that were available during my care of the patient were reviewed by me and considered in my medical decision making (see chart for details).    I discussed my evaluation of the patient's symptoms, my clinical impression, and my proposed outpatient treatment plan with patient/ family member.. We have discussed anticipatory guidance, scheduled follow-up, and careful return precautions. The patient expresses understanding and is comfortable with the discharge plan. All patient's questions were answered.   ____________________________________________   FINAL CLINICAL IMPRESSION(S) / ED DIAGNOSES  Final diagnoses:  Hypertensive emergency  Acute nonintractable headache, unspecified headache type      NEW MEDICATIONS STARTED DURING THIS VISIT:  New Prescriptions   HYDROCHLOROTHIAZIDE (HYDRODIURIL) 25 MG TABLET    Take 1 tablet (25 mg total) by mouth daily.     Note:  This document was prepared using Dragon voice recognition software and may include unintentional dictation errors.    [name], MD 09/28/15 [phone]

## 2015-09-28 NOTE — ED Notes (Signed)
RN completed secondary neuro exam with same NIH however, exam was inconsistent with initial in regards to limb and hemisphere involvement. Patient's speech is noted to rapidly resolve and reoccur dependant on conversation. When pressing patient on her neuro symptoms, patient's speech worsens. When discussing non-symptom issues, patient's speech resolves.

## 2015-09-28 NOTE — ED Notes (Signed)
Pt came to ED via EMS from home c/o headache starting last night. Pt reports 30 mins ago started having speech changes and feeling numb on right side of face. Blood pressure 257/113 per EMS. Pt has history htn, diabetes, previous TIA. Taking plavix.

## 2015-09-28 NOTE — ED Notes (Signed)
Headache and neuro symptoms resolved

## 2015-09-28 NOTE — ED Notes (Signed)
Patients family states that patient passed out. Dr Don Perkingveronese at bedside, patient "unresponsive" initially, unable to follow commands but alert making eye contact... however patient responded with complete movement of all 4 extremities with an ammonia inhalent. OK to discharge per Don PerkingVeronese

## 2015-09-28 NOTE — ED Notes (Signed)
Pt transported to CT ?

## 2015-09-28 NOTE — Discharge Instructions (Signed)
Continue lisinopril. Start hydrochlorothiazide 25 mg daily tomorrow. Follow-up with your doctor Monday. Return to the emergency department if you have chest pain, shortness of breath, dizziness, severe headache, facial droop, weakness or numbness of extremities, difficulty walking, slurred speech, or any new symptoms concerning to you.

## 2015-10-14 ENCOUNTER — Ambulatory Visit: Payer: 59 | Admitting: Neurology

## 2015-12-05 ENCOUNTER — Telehealth: Payer: Self-pay

## 2015-12-05 DIAGNOSIS — I619 Nontraumatic intracerebral hemorrhage, unspecified: Secondary | ICD-10-CM

## 2015-12-05 NOTE — Telephone Encounter (Signed)
-----   Message from Richarda OverlieJada A Fox, [place] sent at 09/06/2015  1:46 PM EDT ----- LFT,  CBC

## 2015-12-05 NOTE — Telephone Encounter (Signed)
Message left for pt to return call to come have labs completed.

## 2015-12-17 ENCOUNTER — Ambulatory Visit: Payer: Self-pay | Admitting: Neurology

## 2015-12-26 ENCOUNTER — Emergency Department (HOSPITAL_COMMUNITY): Payer: 59

## 2015-12-26 ENCOUNTER — Encounter (HOSPITAL_COMMUNITY): Payer: Self-pay

## 2015-12-26 ENCOUNTER — Inpatient Hospital Stay (HOSPITAL_COMMUNITY)
Admission: EM | Admit: 2015-12-26 | Discharge: 2016-01-01 | DRG: 064 | Disposition: A | Discharge status: Home / Self Care | Payer: 59 | Attending: Internal Medicine | Admitting: Internal Medicine

## 2015-12-26 DIAGNOSIS — I10 Essential (primary) hypertension: Secondary | ICD-10-CM | POA: Diagnosis present

## 2015-12-26 DIAGNOSIS — B373 Candidiasis of vulva and vagina: Secondary | ICD-10-CM | POA: Diagnosis not present

## 2015-12-26 DIAGNOSIS — D649 Anemia, unspecified: Secondary | ICD-10-CM | POA: Diagnosis present

## 2015-12-26 DIAGNOSIS — Z7902 Long term (current) use of antithrombotics/antiplatelets: Secondary | ICD-10-CM

## 2015-12-26 DIAGNOSIS — I748 Embolism and thrombosis of other arteries: Secondary | ICD-10-CM | POA: Diagnosis present

## 2015-12-26 DIAGNOSIS — Z79899 Other long term (current) drug therapy: Secondary | ICD-10-CM | POA: Diagnosis not present

## 2015-12-26 DIAGNOSIS — Z823 Family history of stroke: Secondary | ICD-10-CM | POA: Diagnosis not present

## 2015-12-26 DIAGNOSIS — Z801 Family history of malignant neoplasm of trachea, bronchus and lung: Secondary | ICD-10-CM

## 2015-12-26 DIAGNOSIS — Z794 Long term (current) use of insulin: Secondary | ICD-10-CM | POA: Diagnosis not present

## 2015-12-26 DIAGNOSIS — E1142 Type 2 diabetes mellitus with diabetic polyneuropathy: Secondary | ICD-10-CM | POA: Diagnosis present

## 2015-12-26 DIAGNOSIS — G43909 Migraine, unspecified, not intractable, without status migrainosus: Secondary | ICD-10-CM | POA: Diagnosis present

## 2015-12-26 DIAGNOSIS — G9340 Encephalopathy, unspecified: Secondary | ICD-10-CM | POA: Diagnosis present

## 2015-12-26 DIAGNOSIS — E875 Hyperkalemia: Secondary | ICD-10-CM | POA: Diagnosis present

## 2015-12-26 DIAGNOSIS — Z808 Family history of malignant neoplasm of other organs or systems: Secondary | ICD-10-CM

## 2015-12-26 DIAGNOSIS — E785 Hyperlipidemia, unspecified: Secondary | ICD-10-CM | POA: Diagnosis present

## 2015-12-26 DIAGNOSIS — I82491 Acute embolism and thrombosis of other specified deep vein of right lower extremity: Secondary | ICD-10-CM | POA: Diagnosis not present

## 2015-12-26 DIAGNOSIS — E860 Dehydration: Secondary | ICD-10-CM

## 2015-12-26 DIAGNOSIS — F332 Major depressive disorder, recurrent severe without psychotic features: Secondary | ICD-10-CM

## 2015-12-26 DIAGNOSIS — Z8249 Family history of ischemic heart disease and other diseases of the circulatory system: Secondary | ICD-10-CM

## 2015-12-26 DIAGNOSIS — T383X6A Underdosing of insulin and oral hypoglycemic [antidiabetic] drugs, initial encounter: Secondary | ICD-10-CM | POA: Diagnosis present

## 2015-12-26 DIAGNOSIS — E1165 Type 2 diabetes mellitus with hyperglycemia: Secondary | ICD-10-CM | POA: Diagnosis present

## 2015-12-26 DIAGNOSIS — F329 Major depressive disorder, single episode, unspecified: Secondary | ICD-10-CM | POA: Diagnosis present

## 2015-12-26 DIAGNOSIS — Z833 Family history of diabetes mellitus: Secondary | ICD-10-CM | POA: Diagnosis not present

## 2015-12-26 DIAGNOSIS — A879 Viral meningitis, unspecified: Secondary | ICD-10-CM | POA: Diagnosis present

## 2015-12-26 DIAGNOSIS — Z9114 Patient's other noncompliance with medication regimen: Secondary | ICD-10-CM

## 2015-12-26 DIAGNOSIS — I824Z1 Acute embolism and thrombosis of unspecified deep veins of right distal lower extremity: Secondary | ICD-10-CM | POA: Diagnosis present

## 2015-12-26 DIAGNOSIS — I639 Cerebral infarction, unspecified: Secondary | ICD-10-CM

## 2015-12-26 DIAGNOSIS — I161 Hypertensive emergency: Secondary | ICD-10-CM | POA: Diagnosis present

## 2015-12-26 DIAGNOSIS — I6789 Other cerebrovascular disease: Secondary | ICD-10-CM | POA: Diagnosis not present

## 2015-12-26 DIAGNOSIS — I6621 Occlusion and stenosis of right posterior cerebral artery: Secondary | ICD-10-CM | POA: Diagnosis present

## 2015-12-26 DIAGNOSIS — IMO0002 Reserved for concepts with insufficient information to code with codable children: Secondary | ICD-10-CM | POA: Diagnosis present

## 2015-12-26 DIAGNOSIS — E861 Hypovolemia: Secondary | ICD-10-CM | POA: Diagnosis present

## 2015-12-26 DIAGNOSIS — N179 Acute kidney failure, unspecified: Secondary | ICD-10-CM | POA: Diagnosis present

## 2015-12-26 DIAGNOSIS — I82409 Acute embolism and thrombosis of unspecified deep veins of unspecified lower extremity: Secondary | ICD-10-CM

## 2015-12-26 DIAGNOSIS — E669 Obesity, unspecified: Secondary | ICD-10-CM | POA: Diagnosis present

## 2015-12-26 DIAGNOSIS — Z881 Allergy status to other antibiotic agents status: Secondary | ICD-10-CM

## 2015-12-26 DIAGNOSIS — R29898 Other symptoms and signs involving the musculoskeletal system: Secondary | ICD-10-CM | POA: Diagnosis not present

## 2015-12-26 DIAGNOSIS — I6330 Cerebral infarction due to thrombosis of unspecified cerebral artery: Secondary | ICD-10-CM | POA: Diagnosis not present

## 2015-12-26 DIAGNOSIS — R4189 Other symptoms and signs involving cognitive functions and awareness: Secondary | ICD-10-CM

## 2015-12-26 DIAGNOSIS — R739 Hyperglycemia, unspecified: Secondary | ICD-10-CM

## 2015-12-26 DIAGNOSIS — R509 Fever, unspecified: Secondary | ICD-10-CM

## 2015-12-26 DIAGNOSIS — M79609 Pain in unspecified limb: Secondary | ICD-10-CM | POA: Diagnosis not present

## 2015-12-26 DIAGNOSIS — Z6838 Body mass index (BMI) 38.0-38.9, adult: Secondary | ICD-10-CM

## 2015-12-26 DIAGNOSIS — I6340 Cerebral infarction due to embolism of unspecified cerebral artery: Secondary | ICD-10-CM | POA: Diagnosis not present

## 2015-12-26 DIAGNOSIS — E876 Hypokalemia: Secondary | ICD-10-CM | POA: Diagnosis present

## 2015-12-26 DIAGNOSIS — G0490 Encephalitis and encephalomyelitis, unspecified: Secondary | ICD-10-CM

## 2015-12-26 LAB — I-STAT CHEM 8, ED
BUN: 46 mg/dL — AB (ref 6–20)
CALCIUM ION: 1.09 mmol/L — AB (ref 1.15–1.40)
CHLORIDE: 97 mmol/L — AB (ref 101–111)
CREATININE: 1.4 mg/dL — AB (ref 0.44–1.00)
GLUCOSE: 436 mg/dL — AB (ref 65–99)
HCT: 39 % (ref 36.0–46.0)
Hemoglobin: 13.3 g/dL (ref 12.0–15.0)
POTASSIUM: 5.5 mmol/L — AB (ref 3.5–5.1)
Sodium: 131 mmol/L — ABNORMAL LOW (ref 135–145)
TCO2: 29 mmol/L (ref 0–100)

## 2015-12-26 LAB — DIFFERENTIAL
BASOS PCT: 0 %
Basophils Absolute: 0 10*3/uL (ref 0.0–0.1)
EOS ABS: 0.1 10*3/uL (ref 0.0–0.7)
Eosinophils Relative: 1 %
LYMPHS ABS: 1.2 10*3/uL (ref 0.7–4.0)
Lymphocytes Relative: 15 %
MONO ABS: 0.4 10*3/uL (ref 0.1–1.0)
MONOS PCT: 6 %
NEUTROS ABS: 6.3 10*3/uL (ref 1.7–7.7)
Neutrophils Relative %: 78 %

## 2015-12-26 LAB — PROTIME-INR
INR: 0.98
Prothrombin Time: 13 seconds (ref 11.4–15.2)

## 2015-12-26 LAB — URINALYSIS, ROUTINE W REFLEX MICROSCOPIC
BILIRUBIN URINE: NEGATIVE
Glucose, UA: >1000 mg/dL — AB
KETONES UR: NEGATIVE mg/dL
Leukocytes, UA: NEGATIVE
NITRITE: NEGATIVE
PROTEIN: 100 mg/dL — AB
SPECIFIC GRAVITY, URINE: 1.021 (ref 1.005–1.030)
pH: 8 (ref 5.0–8.0)

## 2015-12-26 LAB — COMPREHENSIVE METABOLIC PANEL
ALT: 34 U/L (ref 14–54)
AST: 68 U/L — AB (ref 15–41)
Albumin: 3.3 g/dL — ABNORMAL LOW (ref 3.5–5.0)
Alkaline Phosphatase: 82 U/L (ref 38–126)
Anion gap: 9 (ref 5–15)
BILIRUBIN TOTAL: 1 mg/dL (ref 0.3–1.2)
BUN: 31 mg/dL — AB (ref 6–20)
CHLORIDE: 96 mmol/L — AB (ref 101–111)
CO2: 25 mmol/L (ref 22–32)
CREATININE: 1.39 mg/dL — AB (ref 0.44–1.00)
Calcium: 9.4 mg/dL (ref 8.9–10.3)
GFR, EST AFRICAN AMERICAN: 50 mL/min — AB (ref >60)
GFR, EST NON AFRICAN AMERICAN: 43 mL/min — AB (ref >60)
Glucose, Bld: 432 mg/dL — ABNORMAL HIGH (ref 65–99)
Potassium: 5.9 mmol/L — ABNORMAL HIGH (ref 3.5–5.1)
Sodium: 130 mmol/L — ABNORMAL LOW (ref 135–145)
TOTAL PROTEIN: 6.3 g/dL — AB (ref 6.5–8.1)

## 2015-12-26 LAB — URINE MICROSCOPIC-ADD ON: WBC, UA: NONE SEEN WBC/hpf (ref 0–5)

## 2015-12-26 LAB — RAPID URINE DRUG SCREEN, HOSP PERFORMED
Amphetamines: NOT DETECTED
Barbiturates: NOT DETECTED
Benzodiazepines: NOT DETECTED
COCAINE: NOT DETECTED
OPIATES: NOT DETECTED
TETRAHYDROCANNABINOL: NOT DETECTED

## 2015-12-26 LAB — I-STAT BETA HCG BLOOD, ED (MC, WL, AP ONLY): HCG, QUANTITATIVE: 15.7 m[IU]/mL — AB (ref <5)

## 2015-12-26 LAB — VALPROIC ACID LEVEL

## 2015-12-26 LAB — APTT: aPTT: 26 seconds (ref 24–36)

## 2015-12-26 LAB — I-STAT TROPONIN, ED: Troponin i, poc: 0.02 ng/mL (ref 0.00–0.08)

## 2015-12-26 LAB — CBC
HEMATOCRIT: 37.1 % (ref 36.0–46.0)
HEMOGLOBIN: 12.5 g/dL (ref 12.0–15.0)
MCH: 27.8 pg (ref 26.0–34.0)
MCHC: 33.7 g/dL (ref 30.0–36.0)
MCV: 82.6 fL (ref 78.0–100.0)
Platelets: 187 10*3/uL (ref 150–400)
RBC: 4.49 MIL/uL (ref 3.87–5.11)
RDW: 12.8 % (ref 11.5–15.5)
WBC: 8 10*3/uL (ref 4.0–10.5)

## 2015-12-26 LAB — GLUCOSE, CAPILLARY
GLUCOSE-CAPILLARY: 317 mg/dL — AB (ref 65–99)
GLUCOSE-CAPILLARY: 295 mg/dL — AB (ref 65–99)
Glucose-Capillary: 275 mg/dL — ABNORMAL HIGH (ref 65–99)
Glucose-Capillary: 219 mg/dL — ABNORMAL HIGH (ref 65–99)

## 2015-12-26 LAB — SALICYLATE LEVEL: Salicylate Lvl: <7 mg/dL (ref 2.8–30.0)

## 2015-12-26 LAB — CK: CK TOTAL: 202 U/L (ref 38–234)

## 2015-12-26 LAB — CBG MONITORING, ED
GLUCOSE-CAPILLARY: 439 mg/dL — AB (ref 65–99)
GLUCOSE-CAPILLARY: 332 mg/dL — AB (ref 65–99)

## 2015-12-26 LAB — ACETAMINOPHEN LEVEL: Acetaminophen (Tylenol), Serum: <10 ug/mL — ABNORMAL LOW (ref 10–30)

## 2015-12-26 LAB — I-STAT CG4 LACTIC ACID, ED: Lactic Acid, Venous: 1.34 mmol/L (ref 0.5–1.9)

## 2015-12-26 LAB — MRSA PCR SCREENING: MRSA BY PCR: NEGATIVE

## 2015-12-26 LAB — AMMONIA: AMMONIA: 50 umol/L — AB (ref 9–35)

## 2015-12-26 MED ORDER — LORAZEPAM 2 MG/ML IJ SOLN
2.0000 mg | Freq: Once | INTRAMUSCULAR | Status: AC
  Administered 2015-12-26: 2 mg via INTRAVENOUS
  Filled 2015-12-26: qty 1

## 2015-12-26 MED ORDER — SODIUM CHLORIDE 0.9 % IV BOLUS (SEPSIS)
500.0000 mL | Freq: Once | INTRAVENOUS | Status: AC
  Administered 2015-12-26: 500 mL via INTRAVENOUS

## 2015-12-26 MED ORDER — VANCOMYCIN HCL IN DEXTROSE 1-5 GM/200ML-% IV SOLN: 1000.0000 mg | Freq: Once | INTRAVENOUS | Status: DC

## 2015-12-26 MED ORDER — SODIUM CHLORIDE 0.9 % IV SOLN
2.0000 g | INTRAVENOUS | Status: DC
  Administered 2015-12-26 – 2015-12-30 (×23): 2 g via INTRAVENOUS
  Filled 2015-12-26 (×31): qty 2000

## 2015-12-26 MED ORDER — ACETAMINOPHEN 325 MG PO TABS
650.0000 mg | ORAL_TABLET | Freq: Four times a day (QID) | ORAL | Status: DC | PRN
  Administered 2015-12-28 – 2016-01-01 (×4): 650 mg via ORAL
  Filled 2015-12-26 (×4): qty 2

## 2015-12-26 MED ORDER — LORAZEPAM 2 MG/ML IJ SOLN
INTRAMUSCULAR | Status: AC
  Administered 2015-12-26: 2 mg
  Filled 2015-12-26: qty 1

## 2015-12-26 MED ORDER — VANCOMYCIN HCL IN DEXTROSE 1-5 GM/200ML-% IV SOLN
1000.0000 mg | Freq: Two times a day (BID) | INTRAVENOUS | Status: DC
  Administered 2015-12-26 – 2015-12-27 (×3): 1000 mg via INTRAVENOUS
  Filled 2015-12-26 (×5): qty 200

## 2015-12-26 MED ORDER — PIPERACILLIN-TAZOBACTAM 3.375 G IVPB 30 MIN
3.3750 g | Freq: Once | INTRAVENOUS | Status: AC
  Administered 2015-12-26: 3.375 g via INTRAVENOUS
  Filled 2015-12-26: qty 50

## 2015-12-26 MED ORDER — ACETAMINOPHEN 650 MG RE SUPP
650.0000 mg | Freq: Once | RECTAL | Status: AC
  Administered 2015-12-26: 650 mg via RECTAL
  Filled 2015-12-26: qty 1

## 2015-12-26 MED ORDER — INSULIN ASPART 100 UNIT/ML ~~LOC~~ SOLN
5.0000 [IU] | Freq: Once | SUBCUTANEOUS | Status: AC
  Administered 2015-12-26: 5 [IU] via SUBCUTANEOUS
  Filled 2015-12-26: qty 1

## 2015-12-26 MED ORDER — DEXTROSE-NACL 5-0.45 % IV SOLN: INTRAVENOUS | Status: DC

## 2015-12-26 MED ORDER — DEXTROSE 5 % IV SOLN
2.0000 g | Freq: Two times a day (BID) | INTRAVENOUS | Status: DC
  Administered 2015-12-26 – 2015-12-29 (×8): 2 g via INTRAVENOUS
  Filled 2015-12-26 (×11): qty 2

## 2015-12-26 MED ORDER — LORAZEPAM 2 MG/ML IJ SOLN: 2.0000 mg | Freq: Once | INTRAMUSCULAR | Status: DC | PRN

## 2015-12-26 MED ORDER — SODIUM CHLORIDE 0.9 % IV SOLN
INTRAVENOUS | Status: DC
  Administered 2015-12-26 – 2015-12-28 (×3): via INTRAVENOUS

## 2015-12-26 MED ORDER — DEXTROSE 5 % IV SOLN
500.0000 mg | Freq: Every day | INTRAVENOUS | Status: DC
Start: 2015-12-26 — End: 2015-12-31
  Administered 2015-12-26 – 2015-12-31 (×6): 500 mg via INTRAVENOUS
  Filled 2015-12-26 (×6): qty 5

## 2015-12-26 MED ORDER — ENOXAPARIN SODIUM 40 MG/0.4ML ~~LOC~~ SOLN
40.0000 mg | SUBCUTANEOUS | Status: DC
  Administered 2015-12-26 – 2015-12-27 (×2): 40 mg via SUBCUTANEOUS
  Filled 2015-12-26 (×3): qty 0.4

## 2015-12-26 MED ORDER — SODIUM CHLORIDE 0.9 % IV BOLUS (SEPSIS)
1000.0000 mL | Freq: Once | INTRAVENOUS | Status: AC
  Administered 2015-12-26: 1000 mL via INTRAVENOUS

## 2015-12-26 MED ORDER — HYDRALAZINE HCL 20 MG/ML IJ SOLN
10.0000 mg | INTRAMUSCULAR | Status: DC | PRN
  Administered 2015-12-26 – 2015-12-29 (×3): 10 mg via INTRAVENOUS
  Filled 2015-12-26 (×4): qty 1

## 2015-12-26 MED ORDER — INSULIN ASPART 100 UNIT/ML ~~LOC~~ SOLN
0.0000 [IU] | SUBCUTANEOUS | Status: DC
  Administered 2015-12-26: 8 [IU] via SUBCUTANEOUS
  Administered 2015-12-26: 5 [IU] via SUBCUTANEOUS
  Administered 2015-12-26: 8 [IU] via SUBCUTANEOUS
  Administered 2015-12-27 – 2015-12-28 (×7): 3 [IU] via SUBCUTANEOUS

## 2015-12-26 MED ORDER — ACETAMINOPHEN 650 MG RE SUPP
650.0000 mg | Freq: Four times a day (QID) | RECTAL | Status: DC | PRN
  Administered 2015-12-26: 650 mg via RECTAL
  Filled 2015-12-26: qty 1

## 2015-12-26 MED ORDER — HALOPERIDOL LACTATE 5 MG/ML IJ SOLN
5.0000 mg | Freq: Once | INTRAMUSCULAR | Status: AC
  Administered 2015-12-26: 5 mg via INTRAMUSCULAR
  Filled 2015-12-26: qty 1

## 2015-12-26 MED ORDER — LABETALOL HCL 5 MG/ML IV SOLN
10.0000 mg | Freq: Once | INTRAVENOUS | Status: AC
  Administered 2015-12-26: 10 mg via INTRAVENOUS
  Filled 2015-12-26: qty 4

## 2015-12-26 MED ORDER — VANCOMYCIN HCL 10 G IV SOLR
1500.0000 mg | Freq: Once | INTRAVENOUS | Status: AC
  Administered 2015-12-26: 1500 mg via INTRAVENOUS
  Filled 2015-12-26: qty 1500

## 2015-12-26 NOTE — Progress Notes (Signed)
Inpatient Diabetes Program Recommendations  AACE/ADA: New Consensus Statement on Inpatient Glycemic Control (2015)  Target Ranges:  Prepandial:   less than 140 mg/dL      Peak postprandial:   less than 180 mg/dL (1-2 hours)      Critically ill patients:  140 - 180 mg/dL   Lab Results  Component Value Date   GLUCAP 439 (H) 12/26/2015   HGBA1C 10.5 (H) 08/15/2015    Review of Glycemic Control  Results for Kelly Romero, Kelly R (MRN [id number]) as of 12/26/2015 13:06  Ref. Range 12/26/2015 04:29  Glucose-Capillary Latest Ref Range: 65 - 99 mg/dL 469439 (H)    Diabetes history: Type 2 Outpatient Diabetes medications: Levemir 80 units qam- last given on 12/25/15   Current orders for Inpatient glycemic control: none  Inpatient Diabetes Program Recommendations:  last CBG taken on 439mg /dl @ [id number].   Recommend Novolog moderate correction 0-15 units tid, Novolog 0-5 units qhs.  Consider starting Levemir 1/2 home dose(80 units qam), 40 units qhs starting tonight.  Susette RacerJulie Kyian Obst, RN, BA, MHA, CDE Diabetes Coordinator Inpatient Diabetes Program  [phone] (Team Pager) [phone] [facility] Office) 12/26/2015 1:16 PM

## 2015-12-26 NOTE — ED Triage Notes (Signed)
Via EMS pt husband last seen pt normal at 7:30p; pt became altered at 7:30pm; pt has hx of multiple TIA; Pt is altered using inappropriate words; Pt is highly anxious and screams out; pt is hypertensive with hx of HTN; Pt has hx of DM with insulin use; Pt not following commands well; Pt able to move all extremities;

## 2015-12-26 NOTE — ED Notes (Signed)
Attempted report 

## 2015-12-26 NOTE — ED Notes (Signed)
admitting MD at bedside

## 2015-12-26 NOTE — Consult Note (Signed)
Kelly Romero   Reason for Romero:  AMS and agitation and history of depression Referring Physician:  Dr. Daryll Drown Patient Identification: Kelly Romero MRN:  [id number] Principal Diagnosis: Acute encephalopathy Diagnosis:   Patient Active Problem List   Diagnosis Date Noted  . AKI (acute kidney injury) ([place]) [N17.9] 12/26/2015  . Altered mental status [R41.82]   . Stroke-like symptoms [R29.90]   . Acute encephalopathy [G93.40] 08/17/2015  . Anxiety and depression [F41.8]   . 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 Encephalopathy, metabolic [Z16.96] 78/93/8101  . Uncontrolled type 2 diabetes mellitus with diabetic polyneuropathy ([place]) [E11.42, E11.65] 12/28/2014  . Essential hypertension [I10] 12/28/2014  . New onset headache [R51] 11/02/2014    Total Time spent with patient: 1 hour  Subjective:   Kelly Romero is a 50 y.o. female patient admitted with encephalopathy.  HPI:  Chief Complaint: acute encephalopathy  History of Present Illness: Kelly Romero  Is a 50 years old female admitted with altered mental status and encephalopathy. Patient was unable to contribute much to the psychiatric evaluation because of decreased mental status and  Psychiatric history is limited due to her acute medical condition.  Information for this evaluation up to and from available medical records, case discussed with the staff and also Patient husband, patient repeatedly asking that she wanted to go home and does not want talk with her husband..Patient urine drug screen seems to be negative for drugs of abuse.  Medical history:50 year old female with past medical history of hypertension, diabetes, history of CVA, and depression who presents with altered mental status. Patient was at her aunt's funeral last night and her husband noticed that she had mumbled speech when she was on the phone with her friend at the funeral  home. She then went home and ate dinner and her husband took her to bed. She then woke up around 2:30 AM this morning to use the bathroom and her speech was incoherent. He then called EMS. He denies any seizure-like activity from patient. He has noticed that for the past 2-3 days she has had chills but no night sweats or fevers. He states she's been eating well. She has been more tearful lately due to the passing of her on but otherwise he states she has been able to go to work every day. She has been out of her Depakote and amitriptyline for about 2-3 weeks now. Husband states that patient presented the exact same way in June of this year for acute encephalopathy. Stroke workup at that time was negative and neurology felt her symptoms were due to either seizure activity versus psychogenic versus atypical manifestations of migraine headaches. She was started on Depakote at that time and had quick improvement of her delirium and agitation. They also started her on the baclofen for muscle spasms that admission. She had follow-up with Dr. Tomi Likens with neurology on June 21 and a 24 hour ambulatory EEG ordered that visit was negative for seizures. He recommended referral or neuropsychological testing to evaluate memory. Husband reports pt only take ibuprofen for pain, denies benzo use.   In the emergency department she was given Ativan and Haldol for agitation. A head CT was negative. She was noted to have a rectal temperature of 102.60F. Her husband at bedside preferred to hold off on lumbar puncture at that time. She was started on vancomycin and Zosyn due to concern for meningitis.  Past Psychiatric History: history of depression and was treated with  depakote but no [facility] admissions  Risk to Self: Is patient at risk for suicide?: No Risk to Others:   Prior Inpatient Therapy:   Prior Outpatient Therapy:    Past Medical History:  Past Medical History:  Diagnosis Date  . Diabetes mellitus without complication  (HCC)   . Headache   . Hyperlipidemia   . Hypertension   . TIA (transient ischemic attack)     Past Surgical History:  Procedure Laterality Date  . APPENDECTOMY    . CHOLECYSTECTOMY    . FOOT GANGLION EXCISION    . HERNIA REPAIR    . TEE WITHOUT CARDIOVERSION N/A 04/19/2015   Procedure: TRANSESOPHAGEAL ECHOCARDIOGRAM (TEE);  Surgeon: Jake Bathe, MD;  Location: [facility] ENDOSCOPY;  Service: Cardiovascular;  Laterality: N/A;  . VAGINAL HYSTERECTOMY     Family History:  Family History  Problem Relation Age of Onset  . Stroke Father     9s  . Heart attack Father 25  . COPD Mother   . Heart failure Brother   . Cancer Maternal Grandmother     unknown   . COPD    . Heart failure Maternal Grandfather   . Hypertension Maternal Grandfather   . Cancer Paternal Grandfather     lung  . Diabetes Paternal Grandmother    Family Psychiatric  History: unable to obtain Social History:  History  Alcohol Use No    Comment: rare      History  Drug Use No    Social History   Social History  . Marital status: Married    Spouse name: N/A  . Number of children: N/A  . Years of education: N/A   Occupational History  . Data processing manager    Social History Main Topics  . Smoking status: Never Smoker  . Smokeless tobacco: Never Used  . Alcohol use No     Comment: rare   . Drug use: No  . Sexual activity: No   Other Topics Concern  . None   Social History Narrative  . None   Additional Social History:    Allergies:   Allergies  Allergen Reactions  . Erythromycin Itching    Labs:  Results for orders placed or performed during the hospital encounter of 12/26/15 (from the past 48 hour(s))  CBG monitoring, ED     Status: Abnormal   Collection Time: 12/26/15  4:29 AM  Result Value Ref Range   Glucose-Capillary 439 (H) 65 - 99 mg/dL  Protime-INR     Status: None   Collection Time: 12/26/15  4:30 AM  Result Value Ref Range   Prothrombin Time 13.0 11.4 - 15.2 seconds   INR  0.98   APTT     Status: None   Collection Time: 12/26/15  4:30 AM  Result Value Ref Range   aPTT 26 24 - 36 seconds  CBC     Status: None   Collection Time: 12/26/15  4:30 AM  Result Value Ref Range   WBC 8.0 4.0 - 10.5 K/uL   RBC 4.49 3.87 - 5.11 MIL/uL   Hemoglobin 12.5 12.0 - 15.0 g/dL   HCT 18.1 89.9 - 47.6 %   MCV 82.6 78.0 - 100.0 fL   MCH 27.8 26.0 - 34.0 pg   MCHC 33.7 30.0 - 36.0 g/dL   RDW 49.0 70.6 - 42.7 %   Platelets 187 150 - 400 K/uL  Differential     Status: None   Collection Time: 12/26/15  4:30 AM  Result  Value Ref Range   Neutrophils Relative % 78 %   Neutro Abs 6.3 1.7 - 7.7 K/uL   Lymphocytes Relative 15 %   Lymphs Abs 1.2 0.7 - 4.0 K/uL   Monocytes Relative 6 %   Monocytes Absolute 0.4 0.1 - 1.0 K/uL   Eosinophils Relative 1 %   Eosinophils Absolute 0.1 0.0 - 0.7 K/uL   Basophils Relative 0 %   Basophils Absolute 0.0 0.0 - 0.1 K/uL  Comprehensive metabolic panel     Status: Abnormal   Collection Time: 12/26/15  4:30 AM  Result Value Ref Range   Sodium 130 (L) 135 - 145 mmol/L   Potassium 5.9 (H) 3.5 - 5.1 mmol/L    Comment: SPECIMEN HEMOLYZED. HEMOLYSIS MAY AFFECT INTEGRITY OF RESULTS.   Chloride 96 (L) 101 - 111 mmol/L   CO2 25 22 - 32 mmol/L   Glucose, Bld 432 (H) 65 - 99 mg/dL   BUN 31 (H) 6 - 20 mg/dL   Creatinine, Ser 1.39 (H) 0.44 - 1.00 mg/dL   Calcium 9.4 8.9 - 10.3 mg/dL   Total Protein 6.3 (L) 6.5 - 8.1 g/dL   Albumin 3.3 (L) 3.5 - 5.0 g/dL   AST 68 (H) 15 - 41 U/L   ALT 34 14 - 54 U/L   Alkaline Phosphatase 82 38 - 126 U/L   Total Bilirubin 1.0 0.3 - 1.2 mg/dL   GFR calc non Af Amer 43 (L) >60 mL/min   GFR calc Af Amer 50 (L) >60 mL/min    Comment: (NOTE) The eGFR has been calculated using the CKD EPI equation. This calculation has not been validated in all clinical situations. eGFR's persistently <60 mL/min signify possible Chronic Kidney Disease.    Anion gap 9 5 - 15  CK     Status: None   Collection Time: 12/26/15  4:30 AM   Result Value Ref Range   Total CK 202 38 - 234 U/L    Comment: SPECIMEN HEMOLYZED. HEMOLYSIS MAY AFFECT INTEGRITY OF RESULTS.  Acetaminophen level     Status: Abnormal   Collection Time: 12/26/15  4:30 AM  Result Value Ref Range   Acetaminophen (Tylenol), Serum <10 (L) 10 - 30 ug/mL    Comment:        THERAPEUTIC CONCENTRATIONS VARY SIGNIFICANTLY. A RANGE OF 10-30 ug/mL MAY BE AN EFFECTIVE CONCENTRATION FOR MANY PATIENTS. HOWEVER, SOME ARE BEST TREATED AT CONCENTRATIONS OUTSIDE THIS RANGE. ACETAMINOPHEN CONCENTRATIONS >150 ug/mL AT 4 HOURS AFTER INGESTION AND >50 ug/mL AT 12 HOURS AFTER INGESTION ARE OFTEN ASSOCIATED WITH TOXIC REACTIONS.   Salicylate level     Status: None   Collection Time: 12/26/15  4:30 AM  Result Value Ref Range   Salicylate Lvl <9.9 2.8 - 30.0 mg/dL  Ammonia     Status: Abnormal   Collection Time: 12/26/15  4:30 AM  Result Value Ref Range   Ammonia 50 (H) 9 - 35 umol/L  Valproic acid level     Status: Abnormal   Collection Time: 12/26/15  4:30 AM  Result Value Ref Range   Valproic Acid Lvl <10 (L) 50.0 - 100.0 ug/mL    Comment: RESULTS CONFIRMED BY MANUAL DILUTION  I-stat troponin, ED     Status: None   Collection Time: 12/26/15  4:37 AM  Result Value Ref Range   Troponin i, poc 0.02 0.00 - 0.08 ng/mL   Comment 3            Comment: Due to the release kinetics  of cTnI, a negative result within the first hours of the onset of symptoms does not rule out myocardial infarction with certainty. If myocardial infarction is still suspected, repeat the test at appropriate intervals.   I-Stat Beta hCG blood, ED ([facility], [facility], [facility] only)     Status: Abnormal   Collection Time: 12/26/15  4:37 AM  Result Value Ref Range   I-stat hCG, quantitative 15.7 (H) <5 mIU/mL   Comment 3            Comment:   GEST. AGE      CONC.  (mIU/mL)   <=1 WEEK        5 - 50     2 WEEKS       50 - 500     3 WEEKS       100 - 10,000     4 WEEKS     1,000 - 30,000        FEMALE  AND NON-PREGNANT FEMALE:     LESS THAN 5 mIU/mL   I-Stat Chem 8, ED     Status: Abnormal   Collection Time: 12/26/15  4:38 AM  Result Value Ref Range   Sodium 131 (L) 135 - 145 mmol/L   Potassium 5.5 (H) 3.5 - 5.1 mmol/L   Chloride 97 (L) 101 - 111 mmol/L   BUN 46 (H) 6 - 20 mg/dL   Creatinine, Ser 1.40 (H) 0.44 - 1.00 mg/dL   Glucose, Bld 436 (H) 65 - 99 mg/dL   Calcium, Ion 1.09 (L) 1.15 - 1.40 mmol/L   TCO2 29 0 - 100 mmol/L   Hemoglobin 13.3 12.0 - 15.0 g/dL   HCT 39.0 36.0 - 46.0 %  Urinalysis, Routine w reflex microscopic (not at [facility])     Status: Abnormal   Collection Time: 12/26/15  6:50 AM  Result Value Ref Range   Color, Urine YELLOW YELLOW   APPearance CLEAR CLEAR   Specific Gravity, Urine 1.021 1.005 - 1.030   pH 8.0 5.0 - 8.0   Glucose, UA >1000 (A) NEGATIVE mg/dL   Hgb urine dipstick MODERATE (A) NEGATIVE   Bilirubin Urine NEGATIVE NEGATIVE   Ketones, ur NEGATIVE NEGATIVE mg/dL   Protein, ur 100 (A) NEGATIVE mg/dL   Nitrite NEGATIVE NEGATIVE   Leukocytes, UA NEGATIVE NEGATIVE  Rapid urine drug screen (hospital performed)     Status: None   Collection Time: 12/26/15  6:50 AM  Result Value Ref Range   Opiates NONE DETECTED NONE DETECTED   Cocaine NONE DETECTED NONE DETECTED   Benzodiazepines NONE DETECTED NONE DETECTED   Amphetamines NONE DETECTED NONE DETECTED   Tetrahydrocannabinol NONE DETECTED NONE DETECTED   Barbiturates NONE DETECTED NONE DETECTED    Comment:        DRUG SCREEN FOR MEDICAL PURPOSES ONLY.  IF CONFIRMATION IS NEEDED FOR ANY PURPOSE, NOTIFY LAB WITHIN 5 DAYS.        LOWEST DETECTABLE LIMITS FOR URINE DRUG SCREEN Drug Class       Cutoff (ng/mL) Amphetamine      1000 Barbiturate      200 Benzodiazepine   694 Tricyclics       854 Opiates          300 Cocaine          300 THC              50   Urine microscopic-add on     Status: Abnormal   Collection Time: 12/26/15  6:50 AM  Result Value Ref Range   Squamous Epithelial / LPF 0-5  (A) NONE SEEN   WBC, UA NONE SEEN 0 - 5 WBC/hpf   RBC / HPF 0-5 0 - 5 RBC/hpf   Bacteria, UA RARE (A) NONE SEEN  I-Stat CG4 Lactic Acid, ED  (not at  [facility])     Status: None   Collection Time: 12/26/15  7:50 AM  Result Value Ref Range   Lactic Acid, Venous 1.34 0.5 - 1.9 mmol/L  CBG monitoring, ED     Status: Abnormal   Collection Time: 12/26/15  1:25 PM  Result Value Ref Range   Glucose-Capillary 332 (H) 65 - 99 mg/dL    Current Facility-Administered Medications  Medication Dose Route Frequency Provider Last Rate Last Dose  . ampicillin (OMNIPEN) 2 g in sodium chloride 0.9 % 50 mL IVPB  2 g Intravenous Q4H Denton Brick, MD   Stopped at 12/26/15 1157  . cefTRIAXone (ROCEPHIN) 2 g in dextrose 5 % 50 mL IVPB  2 g Intravenous Q12H Faye Ramsay Rumbarger, RPH   Stopped at 12/26/15 1157  . dextrose 5 %-0.45 % sodium chloride infusion   Intravenous Continuous Denton Brick, MD      . enoxaparin (LOVENOX) injection 40 mg  40 mg Subcutaneous Q24H Denton Brick, MD      . hydrALAZINE (APRESOLINE) injection 10 mg  10 mg Intravenous Q4H PRN Denton Brick, MD   10 mg at 12/26/15 1022  . LORazepam (ATIVAN) injection 2 mg  2 mg Intravenous Once PRN Denton Brick, MD      . valproate (DEPACON) 500 mg in dextrose 5 % 50 mL IVPB  500 mg Intravenous Daily Denton Brick, MD   Stopped at 12/26/15 1323  . vancomycin (VANCOCIN) IVPB 1000 mg/200 mL premix  1,000 mg Intravenous Q12H Faye Ramsay Rumbarger, [facility]       Current Outpatient Prescriptions  Medication Sig Dispense Refill  . amitriptyline (ELAVIL) 25 MG tablet Take 25 mg by mouth at bedtime.     . divalproex (DEPAKOTE) 500 MG DR tablet Take 500 mg by mouth daily.    Marland Kitchen ibuprofen (ADVIL,MOTRIN) 800 MG tablet Take 1 tablet (800 mg total) by mouth every 8 (eight) hours as needed. 30 tablet 2  . insulin detemir (LEVEMIR) 100 UNIT/ML injection Inject 80 Units into the skin every morning.     Marland Kitchen lisinopril (PRINIVIL,ZESTRIL) 30 MG tablet Take 30 mg by mouth  daily.    . rosuvastatin (CRESTOR) 20 MG tablet Take 1 tablet (20 mg total) by mouth at bedtime. 30 tablet 0  . amitriptyline (ELAVIL) 50 MG tablet Take 1 tablet (50 mg total) by mouth at bedtime. (Patient not taking: Reported on 12/26/2015) 30 tablet 2  . baclofen (LIORESAL) 10 MG tablet Take 0.5 tablets (5 mg total) by mouth 3 (three) times daily. (Patient not taking: Reported on 09/04/2015) 30 each 0  . clopidogrel (PLAVIX) 75 MG tablet Take 75 mg by mouth daily.    Marland Kitchen escitalopram (LEXAPRO) 10 MG tablet Take 10 mg by mouth daily.     . hydrochlorothiazide (HYDRODIURIL) 25 MG tablet Take 1 tablet (25 mg total) by mouth daily. (Patient not taking: Reported on 12/26/2015) 30 tablet 1    Musculoskeletal: Strength & Muscle Tone: decreased Gait & Station: unable to stand Patient leans: N/A  Psychiatric Specialty Exam: Physical Exam  ROS  Blood pressure 150/70, pulse 107, temperature (S) 102.9 F (39.4 C), temperature source Rectal, resp. rate 16,  SpO2 96 %.There is no height or weight on file to calculate BMI.  General Appearance: Guarded  Eye Contact:  Fair  Speech:  Slow and Slurred  Volume:  Decreased  Mood:  Anxious  Affect:  Constricted and Labile  Thought Process:  Irrelevant  Orientation:  Full (Time, Place, and Person)  Thought Content:  Rumination  Suicidal Thoughts:  No  Homicidal Thoughts:  No  Memory:  Immediate;   Fair Recent;   Poor  Judgement:  Impaired  Insight:  Shallow  Psychomotor Activity:  Restlessness  Concentration:  Concentration: Fair and Attention Span: Poor  Recall:  [employer] of Knowledge:  Fair  Language:  Good  Akathisia:  Negative  Handed:  Right  AIMS (if indicated):     Assets:  Communication Skills Desire for Improvement Financial Resources/Insurance Housing Intimacy Resilience Social Support Transportation  ADL's:  Impaired  Cognition:  Impaired,  Moderate  Sleep:        Treatment Plan Summary: Daily contact with patient to  assess and evaluate symptoms and progress in treatment and Medication management   Recommended to continue to evaluate for the altered mental status and possible causes for delirium  Psychiatrically does not recommend additional medication at this time  Appreciate psychiatric consultation and follow up as clinically required Please contact [phone] or [phone] if needs further assistance  Disposition: Supportive therapy provided about ongoing stressors.  Ambrose Finland, MD 12/26/2015 1:28 PM

## 2015-12-26 NOTE — ED Notes (Signed)
Pt awake screaming to the top of her lungs , yelling incoherent words,pt continues to rip off monitor and pull at her iv  family at bedside , ativan iv given and pt placed in bil wrist restraints ,

## 2015-12-26 NOTE — ED Notes (Signed)
Report called  

## 2015-12-26 NOTE — ED Provider Notes (Signed)
[facility]-EMERGENCY DEPT Provider Note   CSN: [id number] Arrival date & time: 12/26/15  4098  By signing my name below, I, Kelly Romero, attest that this documentation has been prepared under the direction and in the presence of Kelly Rhine, MD . Electronically Signed: Freida Romero, Scribe. 12/26/2015. 4:04 AM.  History   Chief Complaint Chief Complaint  Patient presents with  . Stroke Symptoms   LEVEL 5 CAVEAT DUE TO AMS  The history is provided by the EMS personnel. No language interpreter was used.     HPI Comments:  Kelly Romero is a 50 y.o. female with a history of TIA and HTN, who presents to the Emergency Department via EMS for AMS. Pt was last seen normal ~ 1930 yesterday (12/25/15) by husband who states pt became confused and belligerent around that time. Husband reports h/o similar episodes with unknown trigger. No syncope or seizure like activity per husband. Husband also denies new meds.  Husband does not suspect overdose.  Tonight she went to the funeral home and soon after that this episode began  Home meds:  Baclofen 10 mg Divalproex Sodium DR 500 mg Amitriptyline HCL 50mg  and 25mg   Past Medical History:  Diagnosis Date  . Diabetes mellitus without complication (HCC)   . Headache   . Hyperlipidemia   . Hypertension   . TIA (transient ischemic attack)     Patient Active Problem List   Diagnosis Date Noted  . Altered mental status   . Stroke-like symptoms   . Acute encephalopathy 08/17/2015  . Anxiety and depression   . CVA (cerebrovascular accident due to intracerebral hemorrhage) (HCC) 08/15/2015  . Encephalopathy, metabolic 12/28/2014  . 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 Essential hypertension 12/28/2014  . New onset headache 11/02/2014    Past Surgical History:  Procedure Laterality Date  . APPENDECTOMY    . CHOLECYSTECTOMY    . FOOT GANGLION EXCISION    . HERNIA REPAIR    . TEE  WITHOUT CARDIOVERSION N/A 04/19/2015   Procedure: TRANSESOPHAGEAL ECHOCARDIOGRAM (TEE);  Surgeon: Jake Bathe, MD;  Location: [facility] ENDOSCOPY;  Service: Cardiovascular;  Laterality: N/A;  . VAGINAL HYSTERECTOMY      OB History    No data available       Home Medications    Prior to Admission medications   Medication Sig Start Date End Date Taking? Authorizing Provider  amitriptyline (ELAVIL) 25 MG tablet Take 25 mg by mouth 2 (two) times daily.    Historical Provider, MD  amitriptyline (ELAVIL) 50 MG tablet Take 1 tablet (50 mg total) by mouth at bedtime. 09/04/15   Kelly Dallas, DO  baclofen (LIORESAL) 10 MG tablet Take 0.5 tablets (5 mg total) by mouth 3 (three) times daily. Patient not taking: Reported on 09/04/2015 08/19/15   Kelly Arbour, MD  clopidogrel (PLAVIX) 75 MG tablet Take 75 mg by mouth daily.    Historical Provider, MD  divalproex (DEPAKOTE) 500 MG DR tablet Take 500 mg by mouth daily.    Historical Provider, MD  Empagliflozin-Linagliptin (GLYXAMBI) 10-5 MG TABS Take 1 tablet by mouth daily.    Historical Provider, MD  escitalopram (LEXAPRO) 10 MG tablet Take 10 mg by mouth daily.     Historical Provider, MD  hydrochlorothiazide (HYDRODIURIL) 25 MG tablet Take 1 tablet (25 mg total) by mouth daily. 09/28/15   Kelly Sickle, MD  ibuprofen (ADVIL,MOTRIN) 800 MG tablet Take 1 tablet (800 mg total) by mouth every 8 (  eight) hours as needed. 09/04/15   Kelly DallasAdam R Jaffe, DO  insulin detemir (LEVEMIR) 100 UNIT/ML injection Inject 80 Units into the skin every morning.     Historical Provider, MD  lisinopril (PRINIVIL,ZESTRIL) 30 MG tablet Take 30 mg by mouth daily.    Historical Provider, MD  rosuvastatin (CRESTOR) 10 MG tablet Take 10 mg by mouth 2 (two) times daily.    Historical Provider, MD  rosuvastatin (CRESTOR) 20 MG tablet Take 1 tablet (20 mg total) by mouth at bedtime. Patient not taking: Reported on 09/19/2015 08/19/15   Darreld McleanVishal Patel, MD    Family History Family History    Problem Relation Age of Onset  . Stroke Father     3650s  . Heart attack Father 6063  . COPD Mother   . Heart failure Brother   . Cancer Maternal Grandmother     unknown   . COPD    . Heart failure Maternal Grandfather   . Hypertension Maternal Grandfather   . Cancer Paternal Grandfather     lung  . Diabetes Paternal Grandmother     Social History Social History  Substance Use Topics  . Smoking status: Never Smoker  . Smokeless tobacco: Never Used  . Alcohol use No     Comment: rare      Allergies   Erythromycin   Review of Systems Review of Systems  Unable to perform ROS: Mental status change     Physical Exam Updated Vital Signs Pulse 119   Resp 21   SpO2 100%   Physical Exam  CONSTITUTIONAL: Anxious, agitated, yelling HEAD: Normocephalic/atraumatic EYES: EOMI/PERRL, no nystagmus.   ENMT: Mucous membranes dry NECK: supple no meningeal signs SPINE/BACK:entire spine nontender CV: S1/S2 noted, tachycardic LUNGS: Lungs are clear to auscultation bilaterally, no apparent distress ABDOMEN: soft, nontender NEURO: Pt is awake/alert, she is confused, she is yelling frequently and she is mumbling and using inappropriate words.  She moves all extremities x 4 No clonus.  No hyper-reflexia  EXTREMITIES: pulses normal/equal, full ROM SKIN: warm, color normal PSYCH:agitated   ED Treatments / Results  DIAGNOSTIC STUDIES:  Oxygen Saturation is 100% on RA, normal by my interpretation.     Labs (all labs ordered are listed, but only abnormal results are displayed) Labs Reviewed  COMPREHENSIVE METABOLIC PANEL - Abnormal; Notable for the following:       Result Value   Sodium 130 (*)    Potassium 5.9 (*)    Chloride 96 (*)    Glucose, Bld 432 (*)    BUN 31 (*)    Creatinine, Ser 1.39 (*)    Total Protein 6.3 (*)    Albumin 3.3 (*)    AST 68 (*)    GFR calc non Af Amer 43 (*)    GFR calc Af Amer 50 (*)    All other components within normal limits   ACETAMINOPHEN LEVEL - Abnormal; Notable for the following:    Acetaminophen (Tylenol), Serum <10 (*)    All other components within normal limits  AMMONIA - Abnormal; Notable for the following:    Ammonia 50 (*)    All other components within normal limits  VALPROIC ACID LEVEL - Abnormal; Notable for the following:    Valproic Acid Lvl <10 (*)    All other components within normal limits  URINALYSIS, ROUTINE W REFLEX MICROSCOPIC (NOT AT [facility]) - Abnormal; Notable for the following:    Glucose, UA >1000 (*)    Hgb urine dipstick MODERATE (*)  Protein, ur 100 (*)    All other components within normal limits  URINE MICROSCOPIC-ADD ON - Abnormal; Notable for the following:    Squamous Epithelial / LPF 0-5 (*)    Bacteria, UA RARE (*)    All other components within normal limits  CBG MONITORING, ED - Abnormal; Notable for the following:    Glucose-Capillary 439 (*)    All other components within normal limits  I-STAT CHEM 8, ED - Abnormal; Notable for the following:    Sodium 131 (*)    Potassium 5.5 (*)    Chloride 97 (*)    BUN 46 (*)    Creatinine, Ser 1.40 (*)    Glucose, Bld 436 (*)    Calcium, Ion 1.09 (*)    All other components within normal limits  I-STAT BETA HCG BLOOD, ED ([facility], [facility], [facility] ONLY) - Abnormal; Notable for the following:    I-stat hCG, quantitative 15.7 (*)    All other components within normal limits  CULTURE, BLOOD (ROUTINE X 2)  CULTURE, BLOOD (ROUTINE X 2)  URINE CULTURE  PROTIME-INR  APTT  CBC  DIFFERENTIAL  CK  SALICYLATE LEVEL  RAPID URINE DRUG SCREEN, HOSP PERFORMED  I-STAT TROPOININ, ED  I-STAT CG4 LACTIC ACID, ED  CBG MONITORING, ED    EKG  EKG Interpretation  Date/Time:  Thursday December 26 2015 03:56:17 EDT Ventricular Rate:  117 PR Interval:    QRS Duration: 91 QT Interval:  334 QTC Calculation: 466 R Axis:   56 Text Interpretation:  Sinus tachycardia Nonspecific T abnormalities, lateral leads Confirmed by Bebe Shaggy  MD, Chester Sibert  [phone]) on 12/26/2015 4:19:41 AM       Radiology Ct Head Wo Contrast  Result Date: 12/26/2015 CLINICAL DATA:  Stroke symptoms. EXAM: CT HEAD WITHOUT CONTRAST TECHNIQUE: Contiguous axial images were obtained from the base of the skull through the vertex without intravenous contrast. COMPARISON:  Multiple prior most recent head CT 09/28/2015, most recent brain MRI 08/15/2015 FINDINGS: Brain: No evidence of acute infarction, hemorrhage, hydrocephalus, extra-axial collection or mass lesion/mass effect. Remote lacunar infarct in the periventricular right frontal lobe. Remote small left occipital infarct. Vascular: No hyperdense vessel or unexpected calcification. Skull: Normal. Negative for fracture or focal lesion. Sinuses/Orbits: No acute finding. Other: None. IMPRESSION: No acute intracranial abnormality. Remote small vessel ischemic infarcts are unchanged. Electronically Signed   By: Rubye Oaks M.D.   On: 12/26/2015 06:01   Dg Chest Portable 1 View  Result Date: 12/26/2015 CLINICAL DATA:  Fever EXAM: PORTABLE CHEST 1 VIEW COMPARISON:  May 13, 2015 FINDINGS: There is atelectatic change in the right base with questionable early pneumonia in this area. Lungs elsewhere clear. Heart size and pulmonary vascularity are normal. No adenopathy. No bone lesions. Trachea appears normal. IMPRESSION: Atelectasis right base. Question early pneumonia in this area. Lungs elsewhere clear. Cardiac silhouette within normal limits. Electronically Signed   By: Bretta Bang III M.D.   On: 12/26/2015 07:36    Procedures Procedures  CRITICAL CARE Performed by: Joya Gaskins Total critical care time: 50 minutes Critical care time was exclusive of separately billable procedures and treating other patients. Critical care was necessary to treat or prevent imminent or life-threatening deterioration. Critical care was time spent personally by me on the following activities: development of treatment plan  with patient and/or surrogate as well as nursing, discussions with consultants, evaluation of patient's response to treatment, examination of patient, obtaining history from patient or surrogate, ordering and performing treatments and interventions, ordering  and review of laboratory studies, ordering and review of radiographic studies, pulse oximetry and re-evaluation of patient's condition.  Medications Ordered in ED Medications  sodium chloride 0.9 % bolus 1,000 mL (1,000 mLs Intravenous New Bag/Given 12/26/15 0745)    And  sodium chloride 0.9 % bolus 1,000 mL (1,000 mLs Intravenous New Bag/Given 12/26/15 0745)    And  sodium chloride 0.9 % bolus 1,000 mL (not administered)    And  sodium chloride 0.9 % bolus 500 mL (not administered)  piperacillin-tazobactam (ZOSYN) IVPB 3.375 g (3.375 g Intravenous New Bag/Given 12/26/15 0746)  vancomycin (VANCOCIN) 1,500 mg in sodium chloride 0.9 % 500 mL IVPB (not administered)  LORazepam (ATIVAN) injection 2 mg (2 mg Intravenous Given 12/26/15 0411)  haloperidol lactate (HALDOL) injection 5 mg (5 mg Intramuscular Given 12/26/15 0400)  insulin aspart (novoLOG) injection 5 Units (5 Units Subcutaneous Given 12/26/15 0457)  labetalol (NORMODYNE,TRANDATE) injection 10 mg (10 mg Intravenous Given 12/26/15 0652)  acetaminophen (TYLENOL) suppository 650 mg (650 mg Rectal Given 12/26/15 0746)     Initial Impression / Assessment and Plan / ED Course  I have reviewed the triage vital signs and the nursing notes.  Pertinent labs & imaging results that were available during my care of the patient were reviewed by me and considered in my medical decision making (see chart for details).  Clinical Course    Pt here for altered mental status Per husband this has happened previously Review of records reveals the following from June 2017 notes:  "She developed considerable agitated, delirious, screaming fits interspersed with lethargic episodes. She was  empirically treated for an asymptomatic UTI given her incontinence and inability to provide history due to her delirious state, however this was considered less likely to be the cause of her symptoms. Neurology had considered a psychogenic component or seizure activity (EEG with generalized slow activity but not seizure focus) and eventually her symptoms were thought related to atypical manifestations of migraine headaches. She was started on Depakote and had quick improvement from delirious and agitated fits to a return to her baseline on hospital day 3. Baclofen was also initiated for muscle spasms."  5:05 AM Due to agitation, ativan and haldol given to patient She is now more calm/cooperative Workup pending at this time 7:00 AM Rectal temp now indicates fever (unable to initially to obtain rectal temp due to her agitation) Urinalysis/cxr pending Per family, this episode is similar to prior episodes that occurred last summer Sister in law at bedside would like to defer any LP procedure at this time Due to fever/tachycardia, I did page out code sepsis 7:42 AM After discussion with husband, he would like to defer LP at this time as pt had similar episode last summer and feels it is exactly the same and does not want procedure performed We will start with broad spectrum antibiotics Discussed with medicine team for admission.   BP (!) 160/109   Pulse (!) 133   Temp (S) 102.9 F (39.4 C) (Rectal) Comment: Dr.Adel Neyer notified   Resp 15   SpO2 98%  Other causes could include serotonin syndrome as well Pt will likely need stepdown admit  Final Clinical Impressions(s) / ED Diagnoses   Final diagnoses:  Acute encephalopathy  Hypertensive emergency  Hyperglycemia    New Prescriptions New Prescriptions   No medications on file    I personally performed the services described in this documentation, which was scribed in my presence. The recorded information has been reviewed and is  accurate.  Kelly Rhine, MD 12/26/15 [phone]

## 2015-12-26 NOTE — H&P (Signed)
Date: 12/26/2015               Patient Name:  Kelly Romero MRN: [id number]  DOB: 22-Apr-1965 Age / Sex: 50 y.o., female   PCP: Noni Saupe, MD         Medical Service: Internal Medicine Teaching Service         Attending Physician: Dr. Inez Catalina, MD    First Contact: Dr. Antony Contras Pager: [phone]  Second Contact: Dr. Earlene Plater Pager: [phone]       After Hours (After 5p/  First Contact Pager: [phone]  weekends / holidays): Second Contact Pager: [phone]   Chief Complaint: acute encephalopathy  History of Present Illness: 50 year old female with past medical history of hypertension, diabetes, history of CVA, and depression who presents with altered mental status. Patient was at her aunt's funeral last night and her husband noticed that she had mumbled speech when she was on the phone with her friend at the funeral home. She then went home and ate dinner and her husband took her to bed. She then woke up around 2:30 AM this morning to use the bathroom and her speech was incoherent. He then called EMS. He denies any seizure-like activity from patient. He has noticed that for the past 2-3 days she has had chills but no night sweats or fevers. He states she's been eating well. She has been more tearful lately due to the passing of her on but otherwise he states she has been able to go to work every day. She has been out of her Depakote and amitriptyline for about 2-3 weeks now. Husband states that patient presented the exact same way in June of this year for acute encephalopathy. Stroke workup at that time was negative and neurology felt her symptoms were due to either seizure activity versus psychogenic versus atypical manifestations of migraine headaches. She was started on Depakote at that time and had quick improvement of her delirium and agitation. They also started her on the baclofen for muscle spasms that admission. She had follow-up with Dr. Everlena Cooper with neurology on June 21  and a 24 hour ambulatory EEG ordered that visit was negative for seizures. He recommended referral or neuropsychological testing to evaluate memory. Husband reports pt only take ibuprofen for pain, denies benzo use.   In the emergency department she was given Ativan and Haldol for agitation. A head CT was negative. She was noted to have a rectal temperature of 102.7F. Her husband at bedside preferred to hold off on lumbar puncture at that time. She was started on vancomycin and Zosyn due to concern for meningitis.  Meds:  Current Meds  Medication Sig  . amitriptyline (ELAVIL) 25 MG tablet Take 25 mg by mouth at bedtime.   . divalproex (DEPAKOTE) 500 MG DR tablet Take 500 mg by mouth daily.  Marland Kitchen ibuprofen (ADVIL,MOTRIN) 800 MG tablet Take 1 tablet (800 mg total) by mouth every 8 (eight) hours as needed.  . insulin detemir (LEVEMIR) 100 UNIT/ML injection Inject 80 Units into the skin every morning.   Marland Kitchen lisinopril (PRINIVIL,ZESTRIL) 30 MG tablet Take 30 mg by mouth daily.  . rosuvastatin (CRESTOR) 20 MG tablet Take 1 tablet (20 mg total) by mouth at bedtime.     Allergies: Allergies as of 12/26/2015 - Review Complete 12/26/2015  Allergen Reaction Noted  . Erythromycin Itching 10/19/2014   Past Medical History:  Diagnosis Date  . Diabetes mellitus without complication (HCC)   . Headache   .  Hyperlipidemia   . Hypertension   . TIA (transient ischemic attack)     Family History  Problem Relation Age of Onset  . Stroke Father     7s  . Heart attack Father 76  . COPD Mother   . Heart failure Brother   . Cancer Maternal Grandmother     unknown   . COPD    . Heart failure Maternal Grandfather   . Hypertension Maternal Grandfather   . Cancer Paternal Grandfather     lung  . Diabetes Paternal Grandmother     Social History   Social History  . Marital status: Married    Spouse name: N/A  . Number of children: N/A  . Years of education: N/A   Occupational History  .  Data processing manager    Social History Main Topics  . Smoking status: Never Smoker  . Smokeless tobacco: Never Used  . Alcohol use No     Comment: rare   . Drug use: No  . Sexual activity: No   Other Topics Concern  . Not on file   Social History Narrative  . No narrative on file    Review of Systems: A complete ROS was negative except as per HPI. Limited by patient's altered mental status.  Physical Exam: Blood pressure 184/87, pulse 112, temperature (S) 102.9 F (39.4 C), temperature source Rectal, resp. rate 15, SpO2 94 %. Physical Exam  Constitutional: She appears well-developed and well-nourished.  HENT:  Head: Normocephalic and atraumatic.  Dry mucous membranes  Eyes: Conjunctivae are normal. Right eye exhibits no discharge. Left eye exhibits no discharge. No scleral icterus.  Pupils reactive to light bilaterally  Neck: Normal range of motion. Neck supple.  Cardiovascular: Normal rate, regular rhythm and normal heart sounds.  Exam reveals no gallop and no friction rub.   No murmur heard. Pulmonary/Chest: Effort normal. No respiratory distress. She has no wheezes.  Abdominal: Soft. Bowel sounds are normal. She exhibits no distension and no mass. There is no tenderness. There is no rebound and no guarding.  Musculoskeletal: She exhibits no edema.  B/l wrist restraints  Neurological:  Patient does not follow commands, she is moving all 4 extremities, negative for neck stiffness and photophobia, negative for brudzinski and Kernig sign.  Skin: Skin is warm. No rash noted. She is not diaphoretic. No erythema. No pallor.    EKG: Sinus tachycardia, negative for spiked T waves  CXR: Poor quality film with poor inspiration. No signs of consolidation  Head CT--No acute intracranial abnormality. Remote small vessel ischemic infarcts are unchanged.  Assessment & Plan by Problem: Principal Problem:   Acute encephalopathy Active Problems:   Uncontrolled type 2 diabetes  mellitus with diabetic polyneuropathy (HCC)   Essential hypertension   AKI (acute kidney injury) (HCC)  Acute encephalopathy--likely secondary to underlying depression. She has been out of her antidepressant medication for 3-[redacted] weeks along with recent stressor of the passing of her family member. Head CT was negative for any acute intracranial abnormalities. UA and UDS negative, CXR not concerning for PNA. No leukocytosis and lactic acid nl. Other considerations would be metabolic abnormalities as BUN and is elevated at 46 and ammonia is 50. This could also be a manifestation of meningitis as she had a fever of 102.91F along with altered mental status which places her at intermediate risk category (1 risk factor which was altered mental status) which confers a 33% chance of adverse outcome. On physical exam she does not have any nuchal  rigidity, Kernig sign, or Brudzinski sign. Her husband would like to hold off on lumbar puncture at this time which is reasonable given that she would be unable to cooperate during the procedure. She was started on broad-spectrum antibiotics in the ED and blood cultures were obtained. Her BP was elevated 222/196 and thus hypertensive encephalopathy vs PRES could also be the cause of patient's presentation. BP improved with ativan and  Haldol.  - Admit to SDU - psych consulted for medication recommendations - continue vanc and stop zosyn - due to age 50>50 will start ampicillin 2g q4h and rocephin per pharm  - follow up blood cultures - IV hydralazine 10mg  for BP >180 - continue home dose depakote 500mg  IV once daily - may need PCCM consult for consideration of precedex gtt  - neuro checks q2h - IV ativan 2mg  prn for agitation, ordered one dose PRN so that IMTS will be notified when additional doses are needed  AKI-- secondary to hypovolemia as she had dry mucous membranes, urine specific gravity was 1.021, BUN/Cr >20. She received 3.5L NS bolus in the ED. CK 202, wnl. -  D5 1/2 normal saline at 75 cc/hr - BMET in the am  Hyperkalemia--K 5.9 on presentation but was hemolyzed, repeat K was 5.5, EKG negative for peaked T waves. Hyperkalemia could be spurious as she was noted to be thrashing around in bed and could have been clenching her fist during blood draw vs acute AKI vs ACEinh use from poor po intake.  - IVF  - BMET in the am  DM2-- last hgba1c was 10.5 on 08/15/2015. On levemir 80 units qAM. Her glucose was elevated at 432 without an anion gap. UA w/ >1000 glucose and negative for ketones.  - IVF - repeat a1c - SSI q4h   HTN-- on HCTZ 25mg  and lisinopril at home, will give IV hydralazine for elevated BP was NPO  Depression-- on amitriptyline 25mg  at home.  - consulted psych per above  DVT ppx- lovenox Diet: NPO Code- FULL     Dispo: Admit patient to Inpatient with expected length of stay greater than 2 midnights.  Signed: Denton Brickiana M Lavida Patch, MD 12/26/2015, 8:37 AM  Pager: [phone]

## 2015-12-26 NOTE — Progress Notes (Signed)
Pharmacy Antibiotic Note  Kelly Romero is a 50 y.o. female admitted on 12/26/2015 with r/o meningitis.  Pharmacy has been consulted for vancomycin and ceftriaxone dosing. She is also being started on ampicillin per MD. TMax is 102.9 and WBC is WNL. SCr 1.39 and lactic is <2.   Plan: - Vancomycin 1500mg  IV x 1 then 1gm IV Q12H - Ceftriaxone 2gm IV Q12H - F/u renal fxn, C&S, clinical status and trough at SS    Temp (24hrs), Avg:102.9 F (39.4 C), Min:102.9 F (39.4 C), Max:102.9 F (39.4 C)   Recent Labs Lab 12/26/15 0430 12/26/15 0438 12/26/15 0750  WBC 8.0  --   --   CREATININE 1.39* 1.40*  --   LATICACIDVEN  --   --  1.34    CrCl cannot be calculated (Unknown ideal weight.).    Allergies  Allergen Reactions  . Erythromycin Itching    Antimicrobials this admission: Vanc 10/12>> Ampicillin 10/12>> CTX 10/12>> Zosyn x 1 10/12  Dose adjustments this admission: N/A  Microbiology results: Pending  Thank you for allowing pharmacy to be a part of this patient's care.  Georgianne Gritz, Drake LeachRachel Lynn 12/26/2015 10:09 AM

## 2015-12-26 NOTE — Progress Notes (Signed)
At ~14:15, Raymon MuttonWhitney Davis, RN and I witnessed seizure like activity including nystagmus and shaking of upper extremities.  Pupils were assessed and reactive.  Notified attending MD.  Patient appears in postictal state at this time.  Will continue to monitor.

## 2015-12-27 ENCOUNTER — Inpatient Hospital Stay (HOSPITAL_COMMUNITY): Payer: 59

## 2015-12-27 DIAGNOSIS — Z90710 Acquired absence of both cervix and uterus: Secondary | ICD-10-CM

## 2015-12-27 DIAGNOSIS — I6340 Cerebral infarction due to embolism of unspecified cerebral artery: Secondary | ICD-10-CM

## 2015-12-27 LAB — COMPREHENSIVE METABOLIC PANEL
ALK PHOS: 62 U/L (ref 38–126)
ALT: 14 U/L (ref 14–54)
AST: 20 U/L (ref 15–41)
Albumin: 2.6 g/dL — ABNORMAL LOW (ref 3.5–5.0)
Anion gap: 7 (ref 5–15)
BUN: 11 mg/dL (ref 6–20)
CALCIUM: 8.4 mg/dL — AB (ref 8.9–10.3)
CHLORIDE: 105 mmol/L (ref 101–111)
CO2: 26 mmol/L (ref 22–32)
CREATININE: 0.91 mg/dL (ref 0.44–1.00)
Glucose, Bld: 211 mg/dL — ABNORMAL HIGH (ref 65–99)
Potassium: 3.3 mmol/L — ABNORMAL LOW (ref 3.5–5.1)
SODIUM: 138 mmol/L (ref 135–145)
Total Bilirubin: 0.3 mg/dL (ref 0.3–1.2)
Total Protein: 5.4 g/dL — ABNORMAL LOW (ref 6.5–8.1)

## 2015-12-27 LAB — GLUCOSE, CAPILLARY
GLUCOSE-CAPILLARY: 198 mg/dL — AB (ref 65–99)
GLUCOSE-CAPILLARY: 194 mg/dL — AB (ref 65–99)
GLUCOSE-CAPILLARY: 173 mg/dL — AB (ref 65–99)
GLUCOSE-CAPILLARY: 179 mg/dL — AB (ref 65–99)
GLUCOSE-CAPILLARY: 177 mg/dL — AB (ref 65–99)
Glucose-Capillary: 177 mg/dL — ABNORMAL HIGH (ref 65–99)

## 2015-12-27 LAB — AMMONIA: Ammonia: 16 umol/L (ref 9–35)

## 2015-12-27 LAB — BASIC METABOLIC PANEL
Anion gap: 7 (ref 5–15)
BUN: 15 mg/dL (ref 6–20)
CALCIUM: 8.2 mg/dL — AB (ref 8.9–10.3)
CO2: 25 mmol/L (ref 22–32)
Chloride: 107 mmol/L (ref 101–111)
Creatinine, Ser: 0.89 mg/dL (ref 0.44–1.00)
GFR calc Af Amer: >60 mL/min (ref >60)
GLUCOSE: 204 mg/dL — AB (ref 65–99)
Potassium: 3.2 mmol/L — ABNORMAL LOW (ref 3.5–5.1)
Sodium: 139 mmol/L (ref 135–145)

## 2015-12-27 LAB — CBC
HCT: 31.6 % — ABNORMAL LOW (ref 36.0–46.0)
Hemoglobin: 10.3 g/dL — ABNORMAL LOW (ref 12.0–15.0)
MCH: 27.8 pg (ref 26.0–34.0)
MCHC: 32.6 g/dL (ref 30.0–36.0)
MCV: 85.2 fL (ref 78.0–100.0)
PLATELETS: 188 10*3/uL (ref 150–400)
RBC: 3.71 MIL/uL — ABNORMAL LOW (ref 3.87–5.11)
RDW: 13.3 % (ref 11.5–15.5)
WBC: 5.9 10*3/uL (ref 4.0–10.5)

## 2015-12-27 LAB — URINE CULTURE

## 2015-12-27 LAB — HCG, QUANTITATIVE, PREGNANCY: hCG, Beta Chain, Quant, S: 9 m[IU]/mL — ABNORMAL HIGH (ref <5)

## 2015-12-27 LAB — SEDIMENTATION RATE: SED RATE: 49 mm/h — AB (ref 0–22)

## 2015-12-27 LAB — C-REACTIVE PROTEIN: CRP: 1 mg/dL — AB (ref <1.0)

## 2015-12-27 MED ORDER — DEXTROSE 5 % IV SOLN
700.0000 mg | Freq: Three times a day (TID) | INTRAVENOUS | Status: DC
  Administered 2015-12-27 – 2015-12-30 (×10): 700 mg via INTRAVENOUS
  Filled 2015-12-27 (×13): qty 14

## 2015-12-27 MED ORDER — INFLUENZA VAC SPLIT QUAD 0.5 ML IM SUSY
0.5000 mL | PREFILLED_SYRINGE | INTRAMUSCULAR | Status: AC
  Administered 2016-01-01: 0.5 mL via INTRAMUSCULAR
  Filled 2015-12-27: qty 0.5

## 2015-12-27 MED ORDER — POTASSIUM CHLORIDE 10 MEQ/100ML IV SOLN
10.0000 meq | INTRAVENOUS | Status: AC
  Administered 2015-12-27 (×4): 10 meq via INTRAVENOUS
  Filled 2015-12-27 (×4): qty 100

## 2015-12-27 MED ORDER — ENOXAPARIN SODIUM 40 MG/0.4ML ~~LOC~~ SOLN: 40.0000 mg | SUBCUTANEOUS | Status: DC

## 2015-12-27 MED ORDER — LIDOCAINE HCL (PF) 1 % IJ SOLN
INTRAMUSCULAR | Status: AC
  Administered 2015-12-27: 16:00:00
  Filled 2015-12-27: qty 5

## 2015-12-27 MED ORDER — PNEUMOCOCCAL VAC POLYVALENT 25 MCG/0.5ML IJ INJ
0.5000 mL | INJECTION | INTRAMUSCULAR | Status: AC
  Administered 2016-01-01: 0.5 mL via INTRAMUSCULAR
  Filled 2015-12-27: qty 0.5

## 2015-12-27 MED ORDER — GADOBENATE DIMEGLUMINE 529 MG/ML IV SOLN
20.0000 mL | Freq: Once | INTRAVENOUS | Status: AC
  Administered 2015-12-27: 20 mL via INTRAVENOUS

## 2015-12-27 MED ORDER — STROKE: EARLY STAGES OF RECOVERY BOOK
Freq: Once | Status: AC
  Administered 2015-12-27: 18:00:00
  Filled 2015-12-27: qty 1

## 2015-12-27 NOTE — Progress Notes (Signed)
   Subjective: Patient is lethargic this morning, laying in bed. Moans in response to questions. Per husband, patient's mental status is unchanged from yesterday.  Objective:  Vital signs in last 24 hours: Vitals:   12/27/15 0633 12/27/15 0700 12/27/15 0800 12/27/15 1147  BP:   (!) 157/65 (!) 148/68  Pulse:    90  Resp: 17  (!) 28 16  Temp:  98.6 F (37 C)  99.2 F (37.3 C)  TempSrc:  Oral  Axillary  SpO2: 98% 98% 100% 100%  Weight:      Height:       Physical Exam Constitutional: NAD, appears comfortable HEENT: Atraumatic, normocephalic. Pupils reactive to light. anicteric sclera.  Neck: Supple, trachea midline.  Cardiovascular: RRR, no murmurs, rubs, or gallops.  Pulmonary/Chest: CTAB, no wheezes, rales, or rhonchi. No chest wall abnormalities.  Abdominal: Soft, non tender, non distended. +BS.  Extremities: Warm and well perfused. Distal pulses intact. No edema.  Neurological: A&Ox3, CN II - XII grossly intact. Moving all extremities spontaneously. Moving head and neck.  Skin: No rashes or erythema  Psychiatric: Normal mood and affect  Assessment/Plan:  Acute Encephalopathy: With hypertensive episodes to 200s systolic yesterday and fever to 103.4 overnight. Patient is lethargic today and has not received any sedating medications since early yesterday evening (ativan and haldol for agitation). Patient ran out of her depakote and amitriptyline for 2-3 weeks prior to presenting. Head CT was negative. Etiology at this time is unclear. Meningitis is high on the differential given her persistent fevers. PRES or hypertensive encephalopathy is also a consideration with her high blood pressures. Patient was started on vancomycin and zosyn in the ER and switched to ampicillin and rocephin on admission. Today is day 2 of antibiotics with no improvement in mental status. Of note, beta hCG was checked in the ED and noted to be mildly elevated, and remained elevated today on repeat testing.  Patient is [age] and s/p hysterectomy. She is not complaining of abdominal pain, I think ectopic pregnancy is highly unlikely. Per uptodate, elevated hCG in a perimenopausal women is likely due to tumor or pituitary secretion.  -- Will start IV acyclovir per pharmacy for HSV coverage -- MRI today, follow up results  -- Will proceed with LP today if family agrees. Initially declined in the ED.  -- Droplet precaution until neisseria meningitis is ruled out with LP -- Blood cultures pending -- Continue IV hydralazine 10 mg for BP>180 -- Continue home depakote 500 mg IV daily  -- Neuro checks q2h  AKI: Likely prerenal from hypoveolemia, BUN/Cr > 20. She recevied 3.5 L NS in ED.  -- normal saline at 75 cc / hr  -- Resolved today   Hyperkalemia: Resovled  DM2-- last hgba1c was 10.5 on 08/15/2015. On levemir 80 units qAM. Her glucose was elevated at 432 without an anion gap. UA w/ >1000 glucose and negative for ketones.  -- IVF -- repeat a1c -- SSI q4h   HTN --  HCTZ 25mg  and lisinopril at home, will hold for now given NPO --  IV hydralazine PRN for elevated BP   FEN: NS 75 cc/hr, replete lytes prn, NPO VTE ppx: Lovenox  Code Status: FULL   Dispo: Anticipated discharge pending improvement in mental status and further work up.   Kelly Pollarolyn Kaliegh Willadsen, MD 12/27/2015, 12:23 PM Pager: [phone]

## 2015-12-27 NOTE — Progress Notes (Signed)
Inpatient Diabetes Program Recommendations  AACE/ADA: New Consensus Statement on Inpatient Glycemic Control (2015)  Target Ranges:  Prepandial:   less than 140 mg/dL      Peak postprandial:   less than 180 mg/dL (1-2 hours)      Critically ill patients:  140 - 180 mg/dL   Lab Results  Component Value Date   GLUCAP 198 (H) 12/27/2015   HGBA1C 10.5 (H) 08/15/2015    Review of Glycemic Control    Results for [name], Kelly Romero (MRN [id number]) as of 12/27/2015 10:48  Ref. Range 12/26/2015 16:38 12/26/2015 20:00 12/26/2015 23:22 12/27/2015 03:15 12/27/2015 08:16  Glucose-Capillary Latest Ref Range: 65 - 99 mg/dL 409275 (H) 811219 (H) 914295 (H) 177 (H) 198 (H)   Diabetes history: Type 2  Outpatient Diabetes medications: Levemir 80 units qam- last given on 12/25/15   Current orders for Inpatient glycemic control: Novolog moderate correction 0-15 units q4h  Inpatient Diabetes Program Recommendations: Consider starting Levemir 10units qhs (0.1 units/kg) starting tonight.  Susette RacerJulie Emilynn Srinivasan, RN, BA, MHA, CDE Diabetes Coordinator Inpatient Diabetes Program  [phone] (Team Pager) [phone] [employer]([facility] Office) 12/27/2015 10:56 AM

## 2015-12-27 NOTE — Progress Notes (Signed)
Pharmacy Antibiotic Note  ALMAS RAKE is a 50 y.o. 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  Pharmacy has been consulted for acyclovir.     Plan: Acyclovir 700 mg IV q8h  Height:  (162.6 cm) Weight: 222 lb 7.1 oz (100.9 kg) IBW/kg (Calculated) : 54.7  Temp (24hrs), Avg:101.3 F (38.5 C), Min:99 F (37.2 C), Max:103.4 F (39.7 C)   Recent Labs Lab 12/26/15 0430 12/26/15 0438 12/26/15 0750  WBC 8.0  --   --   CREATININE 1.39* 1.40*  --   LATICACIDVEN  --   --  1.34    Estimated Creatinine Clearance: 54.3 mL/min (by C-G formula based on SCr of 1.4 mg/dL (H)).    Allergies  Allergen Reactions  . Erythromycin Itching    Antimicrobials this admission: Vanc 10/12>> Ampicillin 10/12>> CTX 10/12>> Zosyn x 1 10/12 Acyclovir 10/13  >>  Eddie Candle 12/27/2015 7:50 AM

## 2015-12-27 NOTE — Progress Notes (Signed)
Requesting Physician: Dr. Criselda PeachesMullen    Chief Complaint: weakness, AMS, new acute CVA  History obtained from:   Chart     HPI:                                                                                                                                         Kelly Romero is an 50 y.o. female with past medical history of hypertension, diabetes, history of CVA, and depression who presents with altered mental status. Patient was seen back in 08/2015 and was [facility] between 08/15/15 and 08/19/15 for right-sided (however some notes state left-sided) weakness and numbness associated with forgetfulness and agitation.  Blood pressure was elevated.  NIHSS was 4.  Due to minimal symptoms, she did not receive tPA.  Urine tox screen was negative.  CT of head showed no acute findings.  MRI of brain with and without contrast revealed possible small acute infarct in the posterior right MCA territory.  However, it appears on previous imaging, which suggests it is likely an old finding.  Neurology believed it to be an old infarct, as it apparently was seen on a prior MRI in February 2017, which I do not have.  TTE again was unremarkable with EF of 65-70%.  LDL was 231.  Hgb A1c was 10.5.  Exact cause of encephalopathy was unclear.  She underwent an EEG on 08/18/15, which revealed mild to moderate generalized background slowing but no epileptiform activity.  Neurology felt symptoms may be psychogenic.  She was started on Depakote 500mg  twice daily for possible seizures.    She was maintained on ASA 81mg  and Plavix, as well as Rosuvastatin 20mg .  She reports word-finding difficulties, which has been ongoing for several months. Patient then went under ambulatory EEG on 09/04/2015 which also showed no epileptiform activity.   Patient returns to the hospital after she was at her aunt's funeral last night and her husband noticed that she had mumbled speech when she was on the phone with her friend at the funeral home.  She then went home and ate dinner and her husband took her to bed. She then woke up around 2:30 AM this morning to use the bathroom and her speech was incoherent. He then called EMS. He denies any seizure-like activity from patient. He has noticed that for the past 2-3 days she has had chills but no night sweats or fevers.  She has been more tearful lately due to the passing of her on but otherwise he states she has been able to go to work every day. She has been out of her Depakote and amitriptyline for about 2-3 weeks now. Husband states that patient presented the exact same way in June of this year for acute encephalopathy. Stroke workup at that time was negative and neurology felt her symptoms were due to either seizure activity versus psychogenic versus atypical manifestations  of migraine headaches. As noted above, She was started on Depakote at that time and had quick improvement of her delirium and agitation. They also started her on the baclofen for muscle spasms that admission. She had follow-up with Dr. Everlena Cooper with neurology on June 21 and a 24 hour ambulatory EEG ordered that visit was negative for seizures. He recommended referral or neuropsychological testing to evaluate memory.   In the emergency department she was given Ativan and Haldol for agitation. A head CT was negative. She was noted to have a rectal temperature of 102.62F. On arrival her ammonia was 50, sodium 1:30, potassium 5.9, AST 68, ALT 34,  As of 2:00 this morning patient's temperature has decreased and now currently 99.2. White blood cell count 5.9, platelets 188, BUN/creatinine 15, creatinine 0.89. MRI brain shows 1 cm in smaller areas of restricted diffusion in bilateral cerebral hemispheres, parietal white matter on the right and lateral/posterior frontal cortex on the left. Blood cultures of return negative. Urine cultures need to be repeated as it had multiple species suggesting ovary collection. A urine a rapid drug screen was  negative.   Patient currently is awake and able to follow all commands. She states that she aches all over in her joints. Patient has no nuchal rigidity with supple neck. Patient is able to look around the room and states that she is in the hospital. MRA of neck is pending.  Patient states that she has been achy all over for some great time. When asked exactly what this means she states at least 3 or 4 weeks.  Patient currently has received Lovenox at 10 AM.  Date last known well: Unable to determine Time last known well: Unable to determine tPA Given: No: No last seen normal  Past Medical History:  Diagnosis Date  . Diabetes mellitus without complication (HCC)   . Headache   . Hyperlipidemia   . Hypertension   . TIA (transient ischemic attack)     Past Surgical History:  Procedure Laterality Date  . APPENDECTOMY    . CHOLECYSTECTOMY    . FOOT GANGLION EXCISION    . HERNIA REPAIR    . TEE WITHOUT CARDIOVERSION N/A 04/19/2015   Procedure: TRANSESOPHAGEAL ECHOCARDIOGRAM (TEE);  Surgeon: Jake Bathe, MD;  Location: [facility] ENDOSCOPY;  Service: Cardiovascular;  Laterality: N/A;  . VAGINAL HYSTERECTOMY      Family History  Problem Relation Age of Onset  . Stroke Father     3s  . Heart attack Father 93  . COPD Mother   . Heart failure Brother   . Cancer Maternal Grandmother     unknown   . COPD    . Heart failure Maternal Grandfather   . Hypertension Maternal Grandfather   . Cancer Paternal Grandfather     lung  . Diabetes Paternal Grandmother    Social History:  reports that she has never smoked. She has never used smokeless tobacco. She reports that she does not drink alcohol or use drugs.  Allergies:  Allergies  Allergen Reactions  . Erythromycin Itching    Medications:  Scheduled: . acyclovir  700 mg Intravenous Q8H  . ampicillin (OMNIPEN) IV   2 g Intravenous Q4H  . cefTRIAXone (ROCEPHIN)  IV  2 g Intravenous Q12H  . enoxaparin (LOVENOX) injection  40 mg Subcutaneous Q24H  . insulin aspart  0-15 Units Subcutaneous Q4H  . lidocaine (PF)      . potassium chloride  10 mEq Intravenous Q1 Hr x 4  . valproate sodium  500 mg Intravenous Daily  . vancomycin  1,000 mg Intravenous Q12H    ROS:                                                                                                                                       History obtained from the patient  General ROS: negative for - chills, fatigue, fever, night sweats, weight gain or weight loss Psychological ROS: negative for - behavioral disorder, hallucinations, memory difficulties, mood swings or suicidal ideation Ophthalmic ROS: negative for - blurry vision, double vision, eye pain or loss of vision ENT ROS: negative for - epistaxis, nasal discharge, oral lesions, sore throat, tinnitus or vertigo Allergy and Immunology ROS: negative for - hives or itchy/watery eyes Hematological and Lymphatic ROS: negative for - bleeding problems, bruising or swollen lymph nodes Endocrine ROS: negative for - galactorrhea, hair pattern changes, polydipsia/polyuria or temperature intolerance Respiratory ROS: negative for - cough, hemoptysis, shortness of breath or wheezing Cardiovascular ROS: negative for - chest pain, dyspnea on exertion, edema or irregular heartbeat Gastrointestinal ROS: negative for - abdominal pain, diarrhea, hematemesis, nausea/vomiting or stool incontinence Genito-Urinary ROS: negative for - dysuria, hematuria, incontinence or urinary frequency/urgency Musculoskeletal ROS: negative for - joint swelling or muscular weakness Neurological ROS: as noted in HPI Dermatological ROS: negative for rash and skin lesion changes  Neurologic Examination:                                                                                                      Blood pressure (!) 148/68, pulse  90, temperature 99.2 F (37.3 C), temperature source Axillary, resp. rate 16, height 5\' 4"  (1.626 m), weight 100.9 kg (222 lb 7.1 oz), SpO2 100 %.  HEENT-  Normocephalic, no lesions, without obvious abnormality.  Normal external eye and conjunctiva.  Normal TM's bilaterally.  Normal auditory canals and external ears. Normal external nose, mucus membranes and septum.  Normal pharynx. Cardiovascular- S1, S2 normal, pulses palpable throughout   Lungs- chest clear, no wheezing, rales, normal symmetric air entry Abdomen- normal findings: bowel sounds  normal Extremities- no edema Lymph-no adenopathy palpable Musculoskeletal-no joint tenderness, deformity or swelling Skin-warm and dry, no hyperpigmentation, vitiligo, or suspicious lesions  Neurological Examination Mental Status: Drowsy but alert to the fact that she is in the hospital and able to follow commands.  Speech fluent without evidence of aphasia.  Able to follow simple commands without difficulty. Cranial Nerves: II:  Visual fields grossly normal, pupils equal, round, reactive to light and accommodation III,IV, VI: ptosis not present, extra-ocular motions intact bilaterally V,VII: smile symmetric, facial light touch sensation normal bilaterally VIII: hearing normal bilaterally IX,X: uvula rises symmetrically XI: bilateral shoulder shrug XII: midline tongue extension Motor: Right : Upper extremity   5/5    Left:     Upper extremity   5/5  Lower extremity   4/5     Lower extremity   5/5 Tone and bulk:normal tone throughout; no atrophy noted Sensory: Pinprick and light touch intact throughout, bilaterally Deep Tendon Reflexes: 1+ and symmetric throughout Plantars: Right: downgoing   Left: downgoing Cerebellar: normal finger-to-nose,and normal heel-to-shin test Gait: Not tested       Lab Results: Basic Metabolic Panel:  Recent Labs Lab 12/26/15 0430 12/26/15 0438 12/27/15 0633  NA 130* 131* 139  K 5.9* 5.5* 3.2*  CL  96* 97* 107  CO2 25  --  25  GLUCOSE 432* 436* 204*  BUN 31* 46* 15  CREATININE 1.39* 1.40* 0.89  CALCIUM 9.4  --  8.2*    Liver Function Tests:  Recent Labs Lab 12/26/15 0430  AST 68*  ALT 34  ALKPHOS 82  BILITOT 1.0  PROT 6.3*  ALBUMIN 3.3*   No results for input(s): LIPASE, AMYLASE in the last 168 hours.  Recent Labs Lab 12/26/15 0430  AMMONIA 50*    CBC:  Recent Labs Lab 12/26/15 0430 12/26/15 0438 12/27/15 0633  WBC 8.0  --  5.9  NEUTROABS 6.3  --   --   HGB 12.5 13.3 10.3*  HCT 37.1 39.0 31.6*  MCV 82.6  --  85.2  PLT 187  --  188    Cardiac Enzymes:  Recent Labs Lab 12/26/15 0430  CKTOTAL 202    Lipid Panel: No results for input(s): CHOL, TRIG, HDL, CHOLHDL, VLDL, LDLCALC in the last 168 hours.  CBG:  Recent Labs Lab 12/26/15 2000 12/26/15 2322 12/27/15 0315 12/27/15 0816 12/27/15 1239  GLUCAP 219* 295* 177* 198* 194*    Microbiology: Results for orders placed or performed during the hospital encounter of 12/26/15  Urine culture     Status: Abnormal   Collection Time: 12/26/15  6:50 AM  Result Value Ref Range Status   Specimen Description URINE, RANDOM  Final   Special Requests NONE  Final   Culture MULTIPLE SPECIES PRESENT, SUGGEST RECOLLECTION (A)  Final   Report Status 12/27/2015 FINAL  Final  Blood Culture (routine x 2)     Status: None (Preliminary result)   Collection Time: 12/26/15  7:20 AM  Result Value Ref Range Status   Specimen Description BLOOD RIGHT ANTECUBITAL  Final   Special Requests   Final    BOTTLES DRAWN AEROBIC AND ANAEROBIC 10CC AER 5CC ANA   Culture NO GROWTH 1 DAY  Final   Report Status PENDING  Incomplete  Blood Culture (routine x 2)     Status: None (Preliminary result)   Collection Time: 12/26/15  7:43 AM  Result Value Ref Range Status   Specimen Description BLOOD RIGHT HAND  Final   Special Requests BOTTLES  DRAWN AEROBIC AND ANAEROBIC 10CC  Final   Culture NO GROWTH 1 DAY  Final   Report Status  PENDING  Incomplete  MRSA PCR Screening     Status: None   Collection Time: 12/26/15  3:12 PM  Result Value Ref Range Status   MRSA by PCR NEGATIVE NEGATIVE Final    Comment:        The GeneXpert MRSA Assay (FDA approved for NASAL specimens only), is one component of a comprehensive MRSA colonization surveillance program. It is not intended to diagnose MRSA infection nor to guide or monitor treatment for MRSA infections.     Coagulation Studies:  Recent Labs  12/26/15 0430  LABPROT 13.0  INR 0.98    Imaging: Ct Head Wo Contrast  Result Date: 12/26/2015 CLINICAL DATA:  Stroke symptoms. EXAM: CT HEAD WITHOUT CONTRAST TECHNIQUE: Contiguous axial images were obtained from the base of the skull through the vertex without intravenous contrast. COMPARISON:  Multiple prior most recent head CT 09/28/2015, most recent brain MRI 08/15/2015 FINDINGS: Brain: No evidence of acute infarction, hemorrhage, hydrocephalus, extra-axial collection or mass lesion/mass effect. Remote lacunar infarct in the periventricular right frontal lobe. Remote small left occipital infarct. Vascular: No hyperdense vessel or unexpected calcification. Skull: Normal. Negative for fracture or focal lesion. Sinuses/Orbits: No acute finding. Other: None. IMPRESSION: No acute intracranial abnormality. Remote small vessel ischemic infarcts are unchanged. Electronically Signed   By: Rubye Oaks M.D.   On: 12/26/2015 06:01   Mr Laqueta Jean RU Contrast  Result Date: 12/27/2015 CLINICAL DATA:  Encephalopathy. EXAM: MRI HEAD WITHOUT AND WITH CONTRAST TECHNIQUE: Multiplanar, multiecho pulse sequences of the brain and surrounding structures were obtained without and with intravenous contrast. CONTRAST:  20mL MULTIHANCE GADOBENATE DIMEGLUMINE 529 MG/ML IV SOLN COMPARISON:  08/15/2015 FINDINGS: Brain: 1 cm and smaller areas of restricted diffusion in the bilateral cerebral hemispheres, periatrial white matter on the right and  lateral/ posterior frontal cortex on the left. There are additionally more hazy areas of DWI hyperintensity around the atrium of the left lateral ventricle and in the subcortical white matter at the vertex (the right posterior frontal infarct was acute on MRI June 2017). There are 2 areas of cortically based enhancement that have a flat appearance on coronal acquisition, present along bilateral sylvian fissure and measuring up to 14 mm length. There is no malignancy history and these are best attributed to subacute infarcts in this setting. There are chronic small infarcts in the inferior left occipital cortex and right corona radiata with hemosiderin staining. Recurrent small-vessel infarct may be related to embolic phenomenon or vasculitis. No evidence of hemorrhage, hydrocephalus, or shift. Vascular: Normal flow voids. Skull and upper cervical spine: Negative Sinuses/Orbits: No acute finding Other: Motion degraded study which could obscure pathology. IMPRESSION: 1. Small bilateral acute and subacute cerebral infarcts as described. 2. Motion degraded exam. Electronically Signed   By: Marnee Spring M.D.   On: 12/27/2015 12:01   Dg Chest Portable 1 View  Result Date: 12/26/2015 CLINICAL DATA:  Fever EXAM: PORTABLE CHEST 1 VIEW COMPARISON:  May 13, 2015 FINDINGS: There is atelectatic change in the right base with questionable early pneumonia in this area. Lungs elsewhere clear. Heart size and pulmonary vascularity are normal. No adenopathy. No bone lesions. Trachea appears normal. IMPRESSION: Atelectasis right base. Question early pneumonia in this area. Lungs elsewhere clear. Cardiac silhouette within normal limits. Electronically Signed   By: Bretta Bang III M.D.   On: 12/26/2015 07:36  Assessment and plan discussed with with attending physician and they are in agreement.    Felicie Morn PA-C [facility] [phone]  12/27/2015, 2:49 PM   Assessment: 50 y.o. female  presenting with fever, multifocal infarcts, headaches. Possibilities include endocarditis, vasculitis, emboli from another source.  Her previous episodes are unusual, TIA may be possible  of explanation for those episodes, vasculitis can sometimes present with transient neurological deficits as well.  Stroke Risk Factors - diabetes mellitus, hyperlipidemia and hypertension   1. HgbA1c, fasting lipid panel 2. CT angiogram of the head and neck 3. Frequent neuro checks 4. Echocardiogram 5. Lower extremity Dopplers 6. Prophylactic therapy-Antiplatelet med: Aspirin - dose 325mg  PO or 300mg  PR 7. Risk factor modification 8. Telemetry monitoring 9. PT consult, OT consult, Speech consult 10. Agree with lumbar puncture, received Lovenox earlier today. 11. Agree with empiric coverage pending lumbar puncture 12. . Agree with Vasculitic workup  13. Stroke team to follow  Ritta Slot, MD [facility] [phone]  If 7pm- 7am, please page neurology on call as listed in AMION.

## 2015-12-27 NOTE — Progress Notes (Addendum)
Patient's temperature at 2037 was 102.5 axillary. MD notified and orders for PRN 650mg  Tylenol q6h were obtained. Temp rechecked an hour after administration, was 103.4 axillary. Room temperature was lowered and blankets were removed from patient.  Temp rechecked within the hour, 102.6 axillary. Ice packs applied to axilla and groins. Will continue to monitor patient.

## 2015-12-28 ENCOUNTER — Inpatient Hospital Stay (HOSPITAL_COMMUNITY): Payer: 59

## 2015-12-28 ENCOUNTER — Other Ambulatory Visit: Payer: Self-pay | Admitting: Diagnostic Radiology

## 2015-12-28 DIAGNOSIS — R739 Hyperglycemia, unspecified: Secondary | ICD-10-CM

## 2015-12-28 DIAGNOSIS — I161 Hypertensive emergency: Secondary | ICD-10-CM

## 2015-12-28 DIAGNOSIS — E1142 Type 2 diabetes mellitus with diabetic polyneuropathy: Secondary | ICD-10-CM

## 2015-12-28 DIAGNOSIS — M79609 Pain in unspecified limb: Secondary | ICD-10-CM

## 2015-12-28 DIAGNOSIS — I82409 Acute embolism and thrombosis of unspecified deep veins of unspecified lower extremity: Secondary | ICD-10-CM

## 2015-12-28 DIAGNOSIS — I639 Cerebral infarction, unspecified: Principal | ICD-10-CM

## 2015-12-28 DIAGNOSIS — E785 Hyperlipidemia, unspecified: Secondary | ICD-10-CM

## 2015-12-28 DIAGNOSIS — I82491 Acute embolism and thrombosis of other specified deep vein of right lower extremity: Secondary | ICD-10-CM

## 2015-12-28 DIAGNOSIS — Z7902 Long term (current) use of antithrombotics/antiplatelets: Secondary | ICD-10-CM

## 2015-12-28 LAB — HIV ANTIBODY (ROUTINE TESTING W REFLEX)
HIV Screen 4th Generation wRfx: NONREACTIVE
HIV Screen 4th Generation wRfx: NONREACTIVE

## 2015-12-28 LAB — BLOOD CULTURE ID PANEL (REFLEXED)
ACINETOBACTER BAUMANNII: NOT DETECTED
CANDIDA ALBICANS: NOT DETECTED
CANDIDA GLABRATA: NOT DETECTED
Candida krusei: NOT DETECTED
Candida parapsilosis: NOT DETECTED
Candida tropicalis: NOT DETECTED
ENTEROBACTER CLOACAE COMPLEX: NOT DETECTED
ENTEROBACTERIACEAE SPECIES: NOT DETECTED
Enterococcus species: NOT DETECTED
Escherichia coli: NOT DETECTED
Haemophilus influenzae: NOT DETECTED
Klebsiella oxytoca: NOT DETECTED
Klebsiella pneumoniae: NOT DETECTED
LISTERIA MONOCYTOGENES: NOT DETECTED
NEISSERIA MENINGITIDIS: NOT DETECTED
PSEUDOMONAS AERUGINOSA: NOT DETECTED
Proteus species: NOT DETECTED
STREPTOCOCCUS AGALACTIAE: NOT DETECTED
STREPTOCOCCUS PNEUMONIAE: NOT DETECTED
Serratia marcescens: NOT DETECTED
Staphylococcus aureus (BCID): NOT DETECTED
Staphylococcus species: NOT DETECTED
Streptococcus pyogenes: NOT DETECTED
Streptococcus species: NOT DETECTED

## 2015-12-28 LAB — HEMOGLOBIN A1C
Hgb A1c MFr Bld: 13.2 % — ABNORMAL HIGH (ref 4.8–5.6)
Mean Plasma Glucose: 332 mg/dL

## 2015-12-28 LAB — GRAM STAIN

## 2015-12-28 LAB — CBC
HEMATOCRIT: 30.3 % — AB (ref 36.0–46.0)
Hemoglobin: 9.7 g/dL — ABNORMAL LOW (ref 12.0–15.0)
MCH: 27.9 pg (ref 26.0–34.0)
MCHC: 32 g/dL (ref 30.0–36.0)
MCV: 87.1 fL (ref 78.0–100.0)
PLATELETS: 173 10*3/uL (ref 150–400)
RBC: 3.48 MIL/uL — ABNORMAL LOW (ref 3.87–5.11)
RDW: 13.4 % (ref 11.5–15.5)
WBC: 4.3 10*3/uL (ref 4.0–10.5)

## 2015-12-28 LAB — GLUCOSE, CAPILLARY
GLUCOSE-CAPILLARY: 168 mg/dL — AB (ref 65–99)
GLUCOSE-CAPILLARY: 286 mg/dL — AB (ref 65–99)
GLUCOSE-CAPILLARY: 199 mg/dL — AB (ref 65–99)
GLUCOSE-CAPILLARY: 275 mg/dL — AB (ref 65–99)
Glucose-Capillary: 157 mg/dL — ABNORMAL HIGH (ref 65–99)

## 2015-12-28 LAB — CSF CELL COUNT WITH DIFFERENTIAL
RBC COUNT CSF: 8 /mm3 — AB
TUBE #: 3
WBC, CSF: 1 /mm3 (ref 0–5)

## 2015-12-28 LAB — RPR: RPR Ser Ql: NONREACTIVE

## 2015-12-28 LAB — LIPID PANEL
CHOLESTEROL: 212 mg/dL — AB (ref 0–200)
HDL: 27 mg/dL — AB (ref >40)
LDL Cholesterol: 153 mg/dL — ABNORMAL HIGH (ref 0–99)
TRIGLYCERIDES: 158 mg/dL — AB (ref <150)
Total CHOL/HDL Ratio: 7.9 RATIO
VLDL: 32 mg/dL (ref 0–40)

## 2015-12-28 LAB — VANCOMYCIN, TROUGH: VANCOMYCIN TR: 22 ug/mL — AB (ref 15–20)

## 2015-12-28 LAB — PROTEIN, CSF: Total  Protein, CSF: 49 mg/dL — ABNORMAL HIGH (ref 15–45)

## 2015-12-28 LAB — GLUCOSE, CSF: Glucose, CSF: 98 mg/dL — ABNORMAL HIGH (ref 40–70)

## 2015-12-28 MED ORDER — INSULIN ASPART 100 UNIT/ML ~~LOC~~ SOLN
0.0000 [IU] | Freq: Three times a day (TID) | SUBCUTANEOUS | Status: DC
  Administered 2015-12-28: 3 [IU] via SUBCUTANEOUS
  Administered 2015-12-28 – 2015-12-29 (×2): 8 [IU] via SUBCUTANEOUS
  Administered 2015-12-29 (×2): 5 [IU] via SUBCUTANEOUS
  Administered 2015-12-30: 8 [IU] via SUBCUTANEOUS
  Administered 2015-12-31: 5 [IU] via SUBCUTANEOUS
  Administered 2015-12-31: 8 [IU] via SUBCUTANEOUS
  Administered 2015-12-31 – 2016-01-01 (×2): 3 [IU] via SUBCUTANEOUS
  Administered 2016-01-01: 2 [IU] via SUBCUTANEOUS

## 2015-12-28 MED ORDER — ROSUVASTATIN CALCIUM 20 MG PO TABS: 20.0000 mg | ORAL_TABLET | Freq: Every day | ORAL | Status: DC

## 2015-12-28 MED ORDER — HYDROCHLOROTHIAZIDE 25 MG PO TABS
12.5000 mg | ORAL_TABLET | Freq: Every day | ORAL | Status: DC
  Administered 2015-12-28: 12.5 mg via ORAL
  Filled 2015-12-28: qty 1

## 2015-12-28 MED ORDER — VANCOMYCIN HCL IN DEXTROSE 750-5 MG/150ML-% IV SOLN
750.0000 mg | Freq: Two times a day (BID) | INTRAVENOUS | Status: DC
  Administered 2015-12-28 – 2015-12-30 (×4): 750 mg via INTRAVENOUS
  Filled 2015-12-28 (×7): qty 150

## 2015-12-28 MED ORDER — HYDROCHLOROTHIAZIDE 25 MG PO TABS
25.0000 mg | ORAL_TABLET | Freq: Every day | ORAL | Status: DC
  Administered 2015-12-29 – 2016-01-01 (×3): 25 mg via ORAL
  Filled 2015-12-28 (×3): qty 1

## 2015-12-28 MED ORDER — INSULIN ASPART 100 UNIT/ML ~~LOC~~ SOLN
0.0000 [IU] | Freq: Every day | SUBCUTANEOUS | Status: DC
  Administered 2015-12-28: 3 [IU] via SUBCUTANEOUS
  Administered 2015-12-30: 4 [IU] via SUBCUTANEOUS
  Administered 2015-12-31: 2 [IU] via SUBCUTANEOUS

## 2015-12-28 MED ORDER — ROSUVASTATIN CALCIUM 10 MG PO TABS
40.0000 mg | ORAL_TABLET | Freq: Every day | ORAL | Status: DC
  Administered 2015-12-28 – 2015-12-31 (×4): 40 mg via ORAL
  Filled 2015-12-28 (×2): qty 4
  Filled 2015-12-28: qty 1
  Filled 2015-12-28: qty 4

## 2015-12-28 MED ORDER — LIDOCAINE HCL 1 % IJ SOLN
INTRAMUSCULAR | Status: AC
  Filled 2015-12-28: qty 20

## 2015-12-28 NOTE — Progress Notes (Signed)
3:02 PM   12/28/2015  Discussed with Neurology (Dr. Roda ShuttersXu) regarding patients LE dopplers with evidence of acute DVT involving a small segment of the right peroneal veins in the mid calf.  Given this new finding, we considered having to start anticoagulation and also discussed with IR about timing of LP under fluoroscopy.  Given concerns raised regarding endocarditis as an etiology, it was considered whether a CT angiogram of head and neck should be obtained prior to any anticoagulation.  We calculated her Duke Criteria for infective endocarditis and obtained a score of 1-2 (fevers plus possible embolic pattern stroke) which points to an alternative cause of her presentation and makes endocarditis unlikely.  There is also concern regarding the timing of her lumbar puncture under fluoroscopy and starting anticoagulation.  LP was attempted yesterday at the bedside but unsuccessful.  IR consulted thereafter yesterday afternoon, but wanted to hold off on procedure as patient had Lovenox at 10am on 12/27/2015.  They were agreeable to proceeding with LP this afternoon, for which we are very appreciative.  After further discussion with Neurology and the complicated of this clinical picture, need for cerebral angiogram on Monday, and potential for other procedures we opted to hold off on anticoagulation at this time.  This is based on the presence of an asymptomatic, distal DVT and recommendations per UpToDate.  We will consider observation with serial ultrasound every week for 2 weeks.  If DVT, has proximal extension would consider anticoagulation.  If need to anticoagulate prior to Monday, will plan to start Heparin due to scheduled cerebral angiogram on Monday.  Gwynn BurlyAndrew Bellarae Lizer 12/28/2015, 3:36 PM PGY-2, [facility] Pager: [phone]

## 2015-12-28 NOTE — Progress Notes (Signed)
PHARMACY - PHYSICIAN COMMUNICATION CRITICAL VALUE ALERT - BLOOD CULTURE IDENTIFICATION (BCID)  Results for orders placed or performed during the hospital encounter of 12/26/15  Blood Culture ID Panel (Reflexed) (Collected: 12/26/2015  7:43 AM)  Result Value Ref Range   Enterococcus species NOT DETECTED NOT DETECTED   Listeria monocytogenes NOT DETECTED NOT DETECTED   Staphylococcus species NOT DETECTED NOT DETECTED   Staphylococcus aureus NOT DETECTED NOT DETECTED   Streptococcus species NOT DETECTED NOT DETECTED   Streptococcus agalactiae NOT DETECTED NOT DETECTED   Streptococcus pneumoniae NOT DETECTED NOT DETECTED   Streptococcus pyogenes NOT DETECTED NOT DETECTED   Acinetobacter baumannii NOT DETECTED NOT DETECTED   Enterobacteriaceae species NOT DETECTED NOT DETECTED   Enterobacter cloacae complex NOT DETECTED NOT DETECTED   Escherichia coli NOT DETECTED NOT DETECTED   Klebsiella oxytoca NOT DETECTED NOT DETECTED   Klebsiella pneumoniae NOT DETECTED NOT DETECTED   Proteus species NOT DETECTED NOT DETECTED   Serratia marcescens NOT DETECTED NOT DETECTED   Haemophilus influenzae NOT DETECTED NOT DETECTED   Neisseria meningitidis NOT DETECTED NOT DETECTED   Pseudomonas aeruginosa NOT DETECTED NOT DETECTED   Candida albicans NOT DETECTED NOT DETECTED   Candida glabrata NOT DETECTED NOT DETECTED   Candida krusei NOT DETECTED NOT DETECTED   Candida parapsilosis NOT DETECTED NOT DETECTED   Candida tropicalis NOT DETECTED NOT DETECTED   50 year old female on broad spectrum antibiotics for rule out meningitis. She had a set of blood cultures obtained on 10/12 and 1 bottle is growing Gram positive rods. Nothing was detected on BCID.  This is likely a contaminant but will await ID of the organism on culture.  Name of physician (or Provider) Contacted: Alm BustardMatthew O'Sullivan, MD  Changes to prescribed antibiotics required: None - continue Vancomycin, Ceftriaxone, Ampicillin, Acyclovir for r/o  meningitis  Sallee Provencalurner, Edrees Valent S 12/28/2015  9:24 PM

## 2015-12-28 NOTE — Evaluation (Signed)
Clinical/Bedside Swallow Evaluation Patient Details  Name: LUKA REISCH MRN: [id number] Date of Birth: [birth year]  Today's Date: 12/28/2015 Time: SLP Start Time (ACUTE ONLY): 0827 SLP Stop Time (ACUTE ONLY): 0851 SLP Time Calculation (min) (ACUTE ONLY): 24 min  Past Medical History:  Past Medical History:  Diagnosis Date  . Diabetes mellitus without complication (HCC)   . Headache   . Hyperlipidemia   . Hypertension   . TIA (transient ischemic attack)    Past Surgical History:  Past Surgical History:  Procedure Laterality Date  . APPENDECTOMY    . CHOLECYSTECTOMY    . FOOT GANGLION EXCISION    . HERNIA REPAIR    . TEE WITHOUT CARDIOVERSION N/A 04/19/2015   Procedure: TRANSESOPHAGEAL ECHOCARDIOGRAM (TEE);  Surgeon: Jake Bathe, MD;  Location: [facility] ENDOSCOPY;  Service: Cardiovascular;  Laterality: N/A;  . VAGINAL HYSTERECTOMY     HPI:  50 yo female adm to [facility] with acute AMS - diagnosed with encephalopathy.  PMH + for DM, seizures - per neuro note, pt has been without her Depakote and Amitriptyline for 3 weeks.  Spouse report pt was informed not to take medicine and then told to take it again by prior MD.  Advised to speak to MD re: this question.  Pt admits to issue with sensing food sticking *pointing to proximal cervical esophagus* requiring liquids to clear. She denies coughing associated with eating nor unintentional weight loss.  CXR 10/12 showed ATX Right base, ? early pna.  Of note, pt recently lost her aunt.  Swallow evaluation ordered.     Assessment / Plan / Recommendation Clinical Impression  Pt presents with negative CN exam re: swallow function/ability and functional swallow observed clinically.  Swallow was strong and timely with clear voice throughout and no indication of residuals.  Pt even challenged with successive water swallows with adequate airway protection apparent.    Pt admits to issue with sensing food sticking *pointing to proximal cervical esophagus*  requiring liquids to clear.  She does state liquids are always effective to clear however.  Pt denies coughing associated with eating, active reflux nor unintentional weight loss.    Advised pt to monitor swallowing for worsening and advise MD if warranted.  Educated pt and spouse to alternative ways to take medications to mitigate dysphagia symptoms.  No follow up indicated as all education completed.  Thanks for this consult.     Aspiration Risk  Mild aspiration risk    Diet Recommendation Regular;Thin liquid   Liquid Administration via: Cup;Straw Medication Administration: Whole meds with liquid (as tolerated) Supervision: Patient able to self feed Compensations: Small sips/bites;Slow rate Postural Changes: Seated upright at 90 degrees;Remain upright for at least 30 minutes after po intake    Other  Recommendations Oral Care Recommendations: Oral care BID   Follow up Recommendations        Frequency and Duration            Prognosis        Swallow Study   General Date of Onset: 12/28/15 HPI: 50 yo female adm to [facility] with acute AMS - diagnosed with encephalopathy.  PMH + for DM, seizures - per neuro note, pt has been without her Depakote and Amitriptyline for 3 weeks.  Spouse report pt was informed not to take medicine and then told to take it again by prior MD.  Advised to speak to MD re: this question.  Pt admits to issue with sensing food sticking *pointing to proximal cervical esophagus*  requiring liquids to clear. She denies coughing associated with eating nor unintentional weight loss.  CXR 10/12 showed ATX Right base, ? early pna.  Of note, pt recently lost her aunt.  Swallow evaluation ordered.   Type of Study: Bedside Swallow Evaluation Diet Prior to this Study: NPO Temperature Spikes Noted: Yes (low grade 99.8) Respiratory Status: Nasal cannula History of Recent Intubation: No Behavior/Cognition: Alert;Cooperative;Pleasant mood Oral Cavity Assessment: Other  (comment) (slight whitish coating on lingua musculature) Oral Care Completed by SLP: No Oral Cavity - Dentition: Adequate natural dentition Vision: Functional for self-feeding Self-Feeding Abilities: Able to feed self Patient Positioning: Upright in bed Baseline Vocal Quality: Normal Volitional Cough: Strong Volitional Swallow: Able to elicit    Oral/Motor/Sensory Function Overall Oral Motor/Sensory Function: Within functional limits   Ice Chips Ice chips: Within functional limits Presentation: Spoon   Thin Liquid Thin Liquid: Within functional limits Presentation: Cup;Straw    Nectar Thick Nectar Thick Liquid: Not tested   Honey Thick Honey Thick Liquid: Not tested   Puree Puree: Within functional limits Presentation: Self Fed;Spoon   Solid   GO   Solid: Within functional limits Presentation: Self Lisabeth Pick, MS [facility] SLP [phone]

## 2015-12-28 NOTE — Progress Notes (Addendum)
**  Preliminary report by tech**  Bilateral lower extremity venous duplex completed. There is evidence of acute deep vein thrombosis involving a small segment of the right peroneal veins in the mid calf. There is no evidence of superficial vein thrombosis involving the right lower extremity. There is no evidence of deep or superficial vein thrombosis involving the left lower extremity.  There is no evidence of Baker's cysts bilaterally.  12/28/15 2:01 PM Olen CordialGreg Dorothia Passmore RVT

## 2015-12-28 NOTE — Procedures (Signed)
Post-Procedure Note  Pre-operative Diagnosis:  Encephalitis       Post-operative Diagnosis: Same   Indications: Altered mental status  Procedure Details:   Consent: Informed consent was obtained. Risks of the procedure were discussed including: infection, bleeding, pain and headache.  Findings: 12 CCs Clear color less CSF obtained from L3-4. OP - 28 cm H2O  Complications: None     Condition: Good  Plan: Return to floor

## 2015-12-28 NOTE — Progress Notes (Signed)
Pt's spouse states he can not find pt's earrings. Pt states they were removed when she had a CT scan while in the ED. ED was called and they were unable to locate them , I called CT and the tech stated they would have been put in a denture container and sent back to ED with pt. Marisue Ivanobyn Zuhair Lariccia RN

## 2015-12-28 NOTE — Progress Notes (Signed)
Subjective: Patient seen and examined this morning.  Her husband is present.  She is anxious about LP but understands and is willing to have it done.  She feels better overall.  She is hungry and inquires about when she will be able to go home.  Objective:  Vital signs in last 24 hours: Vitals:   12/27/15 1642 12/27/15 2023 12/27/15 2325 12/28/15 0307  BP: (!) 166/84 (!) 159/71 140/68 135/70  Pulse: 91 88 89 88  Resp: _0 Temp: 99.8 F (37.7 C) 99.4 F (37.4 C) 98.9 F (37.2 C) 98.8 F (37.1 C)  TempSrc: Axillary Axillary Oral Oral  SpO2: 100% 99% 100% 100%  Weight:      Height:       Physical Exam Constitutional: no distress, lying in bed, appears comfortable HEENT: Atraumatic, normocephalic, anicteric sclera.  Neck: Supple, trachea midline.  Cardiovascular: RRR, no murmurs, rubs, or gallops.  Pulmonary/Chest: CTAB, normal effort Abdominal: Soft, obese, non tender, non distended. Extremities: Warm and well perfused. Distal pulses intact. No edema.  Neurological: A&Ox3, CN II - XII grossly intact. Moving all extremities spontaneously. Moving head and neck. Upper extremity strength 5/5, normal coordination, normal finger-to-nose testing Skin: No rashes or erythema  Psychiatric: Normal mood and affect  Assessment/Plan:  Acute Encephalopathy: Unclear etiology.  Afebrile over the past 24 hours.  Mental status has cleared up a lot.  Still has some slowed speech but otherwise coherent and following commands.  Hypertension has improved and was recorded as 135/70 this morning.  It was in the SBP 150s upon my evaluation.  She is more alert this morning as well.  Differential includes vasculitic process, embolic event, TIA, infection such as meningitis, HSV, or endocarditis. PRES or hypertensive encephalopathy is also a consideration with her high blood pressures, but seems less likely given her fevers on presentation.  She has improved on broad spectrum antibiotics, acyclovir  and with lowering her blood pressure.  Neurology has been consulted and their recommendations are appreciated. Of note, beta hCG was checked in the ED and noted to be mildly elevated, and remained elevated today on repeat testing. Patient is 39 and s/p hysterectomy.  Per uptodate, elevated hCG in a perimenopausal women is likely due to tumor or pituitary secretion.  -- ANA pending, ESR 49, CRP 1.  Had negative autoimmune serologies in Dec 2016 (Care Everywhere) -- consider pituitary imaging given elevated bHCG -- continue vanc, rocephin, and ampicillin pending LP -- continue acyclovir pending LP -- LP today with IR under fluoroscopy after unsuccessful bedside attempt yesterday -- Multiple other stroke-work up, hypercoagulable labs ordered by neurology. -- Will proceed with LP today if family agrees. Initially declined in the ED.  -- Droplet precaution until bacterial meningitis is ruled out with LP -- Blood cultures negative thus far -- Continue IV hydralazine 10 mg for BP>180 -- Continue home depakote 500 mg IV daily  -- Neuro checks Q4H -- TTE pending -- Stroke work up per neuro  DM2-- last Hgb A1C was 10.5 on 08/15/2015. On levemir 80 units qAM. Her glucose was elevated at 432 without an anion gap. UA w/ >1000 glucose and negative for ketones.  -- CBGs acceptable so far -- repeat A1C pending -- SSI   HTN --  HCTZ 61m and lisinopril at home --  BP much improved.  Resume home HCTZ at 12.516mtoday since cleared by SLP and BP improving --  IV hydralazine PRN for elevated BP   HLD -- resume  statin.  LDL goal should be less than 70.  Lipid panel is pending.  Hx of Cerebrovascular Disease with Recurrent Strokes: multiple stroke risk factors.  MRI on 10/13 with small bilateral acute and subacute cerebral infarcts. -- stroke work up as above -- will hold her anti-platelets for now since getting LP.  Her outpatient neurologist recommended only continuing Plavix when seen in June 2017.  FEN:  stop fluids, replete lytes prn, Carb mod  VTE ppx: Lovenox   Code Status: FULL   Dispo: Anticipated discharge pending improvement in mental status and further work up.   Jule Ser, DO 12/28/2015, 8:32 AM Pager: [phone]

## 2015-12-28 NOTE — Progress Notes (Signed)
Pharmacy Antibiotic Note  Kelly Romero is a 50 y.o. 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  Pharmacy has been consulted for acyclovir, ceftriaxone, and vancomycin. Cultures negative to date, HSV and viral panel still pending, LP planned for 10/14. Pt currently afebrile, WBC wnl, SCr improving. Vancomycin trough elevated at 22 on 10/14, drawn appropriately.  Plan: -Decrease vancomycin to 750mg  q12h. -Continue ceftriaxone 2g q12h -Continue acyclovir 700 mg IV q8h -Continue ampicillin 2g q4h per MD -Follow up results from LP and culture data, narrow as appropriate -Monitor CBC, SCr, S/Sx infection daily and vanc trough as needed  Height: 5\' 4"  (162.6 cm) Weight: 222 lb 7.1 oz (100.9 kg) IBW/kg (Calculated) : 54.7  Temp (24hrs), Avg:99.1 F (37.3 C), Min:98.6 F (37 C), Max:99.8 F (37.7 C)   Recent Labs Lab 12/26/15 0430 12/26/15 0438 12/26/15 0750 12/27/15 0633 12/27/15 1624 12/28/15 0540 12/28/15 0714  WBC 8.0  --   --  5.9  --  4.3  --   CREATININE 1.39* 1.40*  --  0.89 0.91  --   --   LATICACIDVEN  --   --  1.34  --   --   --   --   VANCOTROUGH  --   --   --   --   --   --  22*    Estimated Creatinine Clearance: 83.6 mL/min (by C-G formula based on SCr of 0.91 mg/dL).    Allergies  Allergen Reactions  . Erythromycin Itching    Antimicrobials this admission: Vanc 10/12>> Ampicillin 10/12>> CTX 10/12>> Acyclovir 10/13 >> Zosyn x 1 10/12  Dose adjustments this admission:  10/14: VT 22, drawn appropriately - reduce to 750 mg q12h  Microbiology results:  10/13 HSV: IP 10/12 blood: ngtd 10/12 urine: redo IP MRSA PCR- neg  Fredonia HighlandMichael Lamari Beckles, PharmD PGY-1 Pharmacy Resident Pager: [phone] 12/28/2015

## 2015-12-28 NOTE — Progress Notes (Addendum)
STROKE TEAM PROGRESS NOTE   HISTORY OF PRESENT ILLNESS (per record) Kelly Romero is an 50 y.o. female with past medical history of hypertension, diabetes, history of CVA, and depression who presents with altered mental status. Patient was seen back in 08/2015 and was admitted to [facility] between 08/15/15 and 08/19/15 for right-sided(however some notes state left-sided)weakness and numbness associated with forgetfulness and agitation. Blood pressure was elevated. NIHSS was 4. Due to minimal symptoms, she did not receive tPA. Urine tox screen was negative. CT of head showed no acute findings. MRI of brain with and without contrast revealed possible small acute infarct in the posterior right MCA territory. However, it appears on previous imaging, which suggests it is likely an old finding. Neurology believed it to be an old infarct, as it apparently was seen on a prior MRI in February 2017, which I do not have. TTE again was unremarkable with EF of 65-70%. LDL was 231. Hgb A1c was 10.5. Exact cause of encephalopathy was unclear. She underwent an EEG on 08/18/15, which revealed mild to moderate generalized background slowing but no epileptiform activity. Neurology felt symptoms may be psychogenic. She was started on Depakote 500mg  twice daily for possible seizures. She was maintained on ASA 81mg  and Plavix, as well as Rosuvastatin 20mg .She reports word-finding difficulties, which has been ongoing for several months. Patient then went under ambulatory EEG on 09/04/2015 which also showed no epileptiform activity.   Patient returns to the hospital after she was at her aunt'sfuneral last night and her husband noticed that she had mumbled speech when she was on the phone with her friend at the funeral home. She then went home and ate dinner and her husband took her to bed. She then woke up around 2:30 AM this morning to use the bathroom and her speech was incoherent. He then called EMS. He denies  any seizure-like activity from patient. He has noticed that for the past 2-3 days she has had chills but no night sweats or fevers.  She has been more tearful lately due to the passing of her aunt but otherwise he states she has been able to go to work every day.She has been out of her Depakote and amitriptyline for about 2-3 weeks now. Husband states that patient presented the exact same way in June of this year for acute encephalopathy. Stroke workup at that time was negative and neurology felt her symptoms were due to either seizure activity versus psychogenic versus atypical manifestations of migraine headaches. As noted above, She was started on Depakote at that time and had quick improvement of her delirium and agitation. They also started her on the baclofen for muscle spasms that admission. She had follow-up with Dr. Everlena Cooper with neurology on June 21 and a 24 hour ambulatory EEG ordered that visit was negative for seizures. He recommended referral or neuropsychological testing to evaluate memory.   In the emergency department she was given Ativan and Haldol for agitation. A head CT was negative. She was noted to have a rectal temperature of 102.38F. On arrival her ammonia was 50, sodium 130, potassium 5.9, AST 68, ALT 34,  As of 2:00 this morning patient's temperature has decreased and now currently 99.2. White blood cell count 5.9, platelets 188, BUN/creatinine 15, creatinine 0.89. MRI brain shows 1 cm in smaller areas of restricted diffusion in bilateral cerebral hemispheres, parietal white matter on the right and lateral/posterior frontal cortex on the left. Blood cultures of return negative. Urine cultures need to be  repeated as it had multiple species suggesting ovary collection. A urine a rapid drug screen was negative.   Patient currently is awake and able to follow all commands. She states that she aches all over in her joints. Patient has no nuchal rigidity with supple neck. Patient is able  to look around the room and states that she is in the hospital. MRA of neck is pending.  Patient states that she has been achy all over for some great time. When asked exactly what this means she states at least 3 or 4 weeks.  Patient currently has received Lovenox at 10 AM.  Date last known well: Unable to determine Time last known well: Unable to determine tPA Given: No: No last seen normal   SUBJECTIVE (INTERVAL HISTORY) Her husband and RN are at the bedside.  Overall she feels her condition is gradually improving. She still has slow speech and complains of memory loss over the years. Denies HA this morning. Discuss with pt and husband about further work up with cerebral angiogram and LP. They are in agreement. Scheduled for angio on Monday.    OBJECTIVE Temp:  [98.8 F (37.1 C)-99.8 F (37.7 C)] 98.8 F (37.1 C) (10/14 0307) Pulse Rate:  [88-91] 88 (10/14 0307) Cardiac Rhythm: Normal sinus rhythm (10/13 1940) Resp:  [12-28] 19 (10/14 0307) BP: (135-166)/(65-84) 135/70 (10/14 0307) SpO2:  [99 %-100 %] 100 % (10/14 0307)  CBC:  Recent Labs Lab 12/26/15 0430  12/27/15 0633 12/28/15 0540  WBC 8.0  --  5.9 4.3  NEUTROABS 6.3  --   --   --   HGB 12.5  < > 10.3* 9.7*  HCT 37.1  < > 31.6* 30.3*  MCV 82.6  --  85.2 87.1  PLT 187  --  188 173  < > = values in this interval not displayed.  Basic Metabolic Panel:  Recent Labs Lab 12/27/15 0633 12/27/15 1624  NA 139 138  K 3.2* 3.3*  CL 107 105  CO2 25 26  GLUCOSE 204* 211*  BUN 15 11  CREATININE 0.89 0.91  CALCIUM 8.2* 8.4*    Lipid Panel:    Component Value Date/Time   CHOL 307 (H) 08/16/2015 0505   TRIG 193 (H) 08/16/2015 0505   HDL 37 (L) 08/16/2015 0505   CHOLHDL 8.3 08/16/2015 0505   VLDL 39 08/16/2015 0505   LDLCALC 231 (H) 08/16/2015 0505   HgbA1c:  Lab Results  Component Value Date   HGBA1C 13.2 (H) 12/27/2015   Urine Drug Screen:    Component Value Date/Time   LABOPIA NONE DETECTED 12/26/2015  0650   COCAINSCRNUR NONE DETECTED 12/26/2015 0650   LABBENZ NONE DETECTED 12/26/2015 0650   AMPHETMU NONE DETECTED 12/26/2015 0650   THCU NONE DETECTED 12/26/2015 0650   LABBARB NONE DETECTED 12/26/2015 0650      IMAGING I have personally reviewed the radiological images below and agree with the radiology interpretations.  Mr Lodema PilotBrain W Wo Contrast 12/27/2015 1. Small bilateral acute and subacute cerebral infarcts as described.  2. Motion degraded exam.   Ct Head Wo Contrast 12/26/2015  IMPRESSION: No acute intracranial abnormality. Remote small vessel ischemic infarcts are unchanged.   Dg Chest Portable 1 View 12/26/2015 IMPRESSION: Atelectasis right base. Question early pneumonia in this area. Lungs elsewhere clear. Cardiac silhouette within normal limits. Electronically Signed   By: Bretta BangWilliam  Woodruff III M.D.   On: 12/26/2015 07:36   LE venous doppler  There is evidence of acute deep vein thrombosis  involving a small segment of the right peroneal veins in the mid calf. There is no evidence of superficial vein thrombosis involving the right lower extremity. There is no evidence of deep or superficial vein thrombosis involving the left lower extremity.  There is no evidence of Baker's cysts bilaterally.  TTE - pending  LP - pending  Cerebral angio - pending  TCD bubble study - pending   PHYSICAL EXAM  Temp:  [98.6 F (37 C)-99.8 F (37.7 C)] 99 F (37.2 C) (10/14 1100) Pulse Rate:  [88-101] 101 (10/14 1100) Resp:  [12-21] 12 (10/14 1100) BP: (135-166)/(68-118) 162/80 (10/14 1100) SpO2:  [96 %-100 %] 96 % (10/14 1100)  General - Well nourished, well developed, depressed mood and in tears.  Ophthalmologic - Fundi not visualized due to eye movement.  Cardiovascular - Regular rate and rhythm.  Mental Status -  Level of arousal and orientation to time, place, and person were intact, but slow on thought processing. Language including expression, naming, repetition,  comprehension was assessed and found intact, but bradyphasia, and mild psychomotor slowing. Attention span and concentration were normal for backward spelling, but no correct on calculation, slow in performing tasks. Recent and remote memory were impaired with 3/3 registration and 1/3 delayed recall Fund of Knowledge was assessed and was intact.  Cranial Nerves II - XII - II - Visual field intact OU. III, IV, VI - Extraocular movements intact. V - Facial sensation intact bilaterally. VII - Facial movement intact bilaterally. VIII - Hearing & vestibular intact bilaterally. X - Palate elevates symmetrically. XI - Chin turning & shoulder shrug intact bilaterally. XII - Tongue protrusion intact.  Motor Strength - [name] strength was normal in all extremities and pronator drift was absent.  Bulk was normal and fasciculations were absent.   Motor Tone - Muscle tone was assessed at the neck and appendages and was normal.  Reflexes - [name] reflexes were 1+ in all extremities and she had no pathological reflexes.  Sensory - Light touch, temperature/pinprick were assessed and were symmetrical.    Coordination - The patient had normal movements in the hands and feet with no ataxia or dysmetria.  Tremor was absent.  Gait and Station - deferred.    ASSESSMENT/PLAN Kelly Romero is a 50 y.o. female with history of hypertension, diabetes mellitus, previous stroke / TIA, depression, and hyperlipidemia presenting with altered mental status, fever, achiness, agitation, and speech difficulties. She did not receive IV t-PA due to unknown time of onset.  Strokes:  Bilateral infarcts cortical and subcortical infarcts, etiology unclear. DDx include: 1. Primary CNS vasculitis - pt has HA and cognitive impairment and recurrent strokes over the last two years, young age, fits well with the clinical picture - will need further LP and cerebral angio for evaluation. Will discuss with pt and  family regarding leptomeningeal and brain biopsy if needed. 2. Atherosclerosis - pt does have multiple stroke risk factors including obesity, HTN, HLD, uncontrolled DM, but with HA, cognitive impairment, young age and concurrent subcortical and cortical infarcts not typical for atherosclerosis 3. Paradoxical emboli - pt has DVT at right LE this time, paradoxical emboli needs to be considered. However, pt had TEE in 04/2015 showed no PFO or ASD. However, will do TCD bubble study to further rule out. 4. Endocarditis - pt had fever PTA, with embolic pattern stroke, endocarditis needs to be ruled out. However, she was afebrile since admission, blood culture NGTD, and clinical picture did not support endocarditis.  Resultant  HA, cognitive impairment  MRI - Small bilateral acute and subacute cerebral infarcts  Lumbar puncture - pending  LE Dopplers - right distal DVT, asymptomatic  Hypercoagulable panel - pending  Cerebral angio - plan for Monday   2D Echo - pending  LDL - 153  HgbA1c pending  VTE prophylaxis - lovenox on hold for LP  Diet NPO time specified  clopidogrel 75 mg daily prior to admission, now on No antithrombotic in preparation for LP.  Patient counseled to be compliant with her antithrombotic medications  Ongoing aggressive stroke risk factor management  Therapy recommendations:  pending  Disposition:  Pending  Follow up with Dr. Everlena Cooper after discharge.  Right LE distal DVT, asymptomatic   Due to complexity of pt clinic picture and work up, may consider to continue observation with serial ultrasound (every week for 2 weeks). If DVT has proximal extension, then consider anticoagulation.   If need anticoagulation, recommend to consider heparin drip first due to cerebral angio on Monday.   History of stroke  11/2014 - dysarthria and right arm numbness - TIA - but elevated BP and glucose with UTI  02/2015 - HA and dysarthria - MRI showed small left occipital  infarct - autoimmune and lupus work up negative  04/2015 - MRI right frontal infarct - TEE negative, no PFO  08/2015 - encephalopathy, HA and ? Seizure - EEG neg, MRI ? Right frontal infarct - A1C 10.5, TTE neg, LDL 231 - put on depakote  08/2015 - follow up with Dr. Everlena Cooper - on ASA and depakote and amitriptyline  09/2015 - ED visit for left facial nubmness - glucose > 500, considered DKA  ? Endocarditis  Fever PTA  Afebrile after admission  Blood culture NGTD  Low Duke score  Less likely endocarditis - agree to hold of TEE and CTA head and neck  Migraine   On amitriptyline and depakote at home  Followed with Dr. Everlena Cooper in clinic  On IV depakote now  Hypertension  Blood pressure somewhat elevated at times.  Permissive hypertension (OK if < 220/120) but gradually normalize in 5-7 days  Long-term BP goal normotensive  Hyperlipidemia  Home meds:  Crestor 20 mg daily resumed in hospital  LDL 153, goal < 70  increase dose to 40 mg daily.  Continue statin at discharge  Diabetes  HgbA1c pending, goal < 7.0  Uncontrolled  SSI  Other Stroke Risk Factors  Obesity, Body mass index is 38.18 kg/m., recommend weight loss, diet and exercise as appropriate   Family hx stroke (father)  Other Active Problems  Anemia - 9.7 / 30.3  Hypokalemia - 3.3 - supplement  Hospital day # 2  I had long discussion with pt and husband at bedside, updated her condition and our thought process about further work up, treatment options, and continued outpt follow up. They expressed understanding.   Marvel Plan, MD PhD Stroke Neurology 12/28/2015 3:30 PM    To contact Stroke Continuity provider, please refer to [email or web address]. After hours, contact General Neurology

## 2015-12-29 ENCOUNTER — Inpatient Hospital Stay (HOSPITAL_COMMUNITY): Payer: 59

## 2015-12-29 DIAGNOSIS — I6330 Cerebral infarction due to thrombosis of unspecified cerebral artery: Secondary | ICD-10-CM

## 2015-12-29 DIAGNOSIS — I10 Essential (primary) hypertension: Secondary | ICD-10-CM

## 2015-12-29 DIAGNOSIS — I6789 Other cerebrovascular disease: Secondary | ICD-10-CM

## 2015-12-29 DIAGNOSIS — R739 Hyperglycemia, unspecified: Secondary | ICD-10-CM

## 2015-12-29 DIAGNOSIS — Z794 Long term (current) use of insulin: Secondary | ICD-10-CM

## 2015-12-29 DIAGNOSIS — Z79899 Other long term (current) drug therapy: Secondary | ICD-10-CM

## 2015-12-29 DIAGNOSIS — D649 Anemia, unspecified: Secondary | ICD-10-CM

## 2015-12-29 DIAGNOSIS — E785 Hyperlipidemia, unspecified: Secondary | ICD-10-CM

## 2015-12-29 DIAGNOSIS — G9340 Encephalopathy, unspecified: Secondary | ICD-10-CM

## 2015-12-29 DIAGNOSIS — I824Z1 Acute embolism and thrombosis of unspecified deep veins of right distal lower extremity: Secondary | ICD-10-CM

## 2015-12-29 DIAGNOSIS — E1165 Type 2 diabetes mellitus with hyperglycemia: Secondary | ICD-10-CM

## 2015-12-29 DIAGNOSIS — Z8673 Personal history of transient ischemic attack (TIA), and cerebral infarction without residual deficits: Secondary | ICD-10-CM

## 2015-12-29 LAB — URINE CULTURE: Culture: 50000 — AB

## 2015-12-29 LAB — MISC LABCORP TEST (SEND OUT)

## 2015-12-29 LAB — COMPREHENSIVE METABOLIC PANEL
ALT: 11 U/L — AB (ref 14–54)
AST: 13 U/L — ABNORMAL LOW (ref 15–41)
Albumin: 2.1 g/dL — ABNORMAL LOW (ref 3.5–5.0)
Alkaline Phosphatase: 58 U/L (ref 38–126)
Anion gap: 6 (ref 5–15)
BUN: 9 mg/dL (ref 6–20)
CHLORIDE: 103 mmol/L (ref 101–111)
CO2: 29 mmol/L (ref 22–32)
CREATININE: 0.98 mg/dL (ref 0.44–1.00)
Calcium: 8.4 mg/dL — ABNORMAL LOW (ref 8.9–10.3)
Glucose, Bld: 327 mg/dL — ABNORMAL HIGH (ref 65–99)
POTASSIUM: 3.7 mmol/L (ref 3.5–5.1)
Sodium: 138 mmol/L (ref 135–145)
Total Bilirubin: 0.6 mg/dL (ref 0.3–1.2)
Total Protein: 4.6 g/dL — ABNORMAL LOW (ref 6.5–8.1)

## 2015-12-29 LAB — CBC
HCT: 30.2 % — ABNORMAL LOW (ref 36.0–46.0)
Hemoglobin: 9.5 g/dL — ABNORMAL LOW (ref 12.0–15.0)
MCH: 27.4 pg (ref 26.0–34.0)
MCHC: 31.5 g/dL (ref 30.0–36.0)
MCV: 87 fL (ref 78.0–100.0)
PLATELETS: 149 10*3/uL — AB (ref 150–400)
RBC: 3.47 MIL/uL — ABNORMAL LOW (ref 3.87–5.11)
RDW: 13.1 % (ref 11.5–15.5)
WBC: 3.2 10*3/uL — ABNORMAL LOW (ref 4.0–10.5)

## 2015-12-29 LAB — CMV IGM: CMV IgM: <30 AU/mL (ref 0.0–29.9)

## 2015-12-29 LAB — GLUCOSE, CAPILLARY
GLUCOSE-CAPILLARY: 288 mg/dL — AB (ref 65–99)
GLUCOSE-CAPILLARY: 189 mg/dL — AB (ref 65–99)
Glucose-Capillary: 213 mg/dL — ABNORMAL HIGH (ref 65–99)
Glucose-Capillary: 239 mg/dL — ABNORMAL HIGH (ref 65–99)

## 2015-12-29 LAB — LUPUS ANTICOAGULANT PANEL
DRVVT: 37.9 s (ref 0.0–47.0)
PTT Lupus Anticoagulant: 35.7 s (ref 0.0–51.9)

## 2015-12-29 LAB — CMV ANTIBODY, IGG (EIA): CMV Ab - IgG: 3.9 U/mL — ABNORMAL HIGH (ref 0.00–0.59)

## 2015-12-29 LAB — ECHOCARDIOGRAM COMPLETE
HEIGHTINCHES: 64 in
WEIGHTICAEL: 3559.11 [oz_av]

## 2015-12-29 LAB — HEMOGLOBIN A1C
Hgb A1c MFr Bld: 13 % — ABNORMAL HIGH (ref 4.8–5.6)
Mean Plasma Glucose: 326 mg/dL

## 2015-12-29 LAB — VARICELLA ZOSTER ANTIBODY, IGG: Varicella IgG: 1130 index (ref >165)

## 2015-12-29 MED ORDER — HEPARIN SODIUM (PORCINE) 5000 UNIT/ML IJ SOLN
5000.0000 [IU] | Freq: Three times a day (TID) | INTRAMUSCULAR | Status: DC
  Administered 2015-12-29 – 2016-01-01 (×9): 5000 [IU] via SUBCUTANEOUS
  Filled 2015-12-29 (×10): qty 1

## 2015-12-29 MED ORDER — ASPIRIN EC 325 MG PO TBEC
325.0000 mg | DELAYED_RELEASE_TABLET | Freq: Every day | ORAL | Status: DC
  Administered 2015-12-29 – 2016-01-01 (×4): 325 mg via ORAL
  Filled 2015-12-29 (×4): qty 1

## 2015-12-29 MED ORDER — INSULIN DETEMIR 100 UNIT/ML ~~LOC~~ SOLN
40.0000 [IU] | Freq: Every day | SUBCUTANEOUS | Status: DC
  Administered 2015-12-29 – 2016-01-01 (×3): 40 [IU] via SUBCUTANEOUS
  Filled 2015-12-29 (×4): qty 0.4

## 2015-12-29 MED ORDER — LISINOPRIL 20 MG PO TABS
30.0000 mg | ORAL_TABLET | Freq: Every day | ORAL | Status: DC
  Administered 2015-12-29 – 2016-01-01 (×3): 30 mg via ORAL
  Filled 2015-12-29 (×3): qty 1

## 2015-12-29 NOTE — Progress Notes (Signed)
  Echocardiogram 2D Echocardiogram has been performed.  Kelly SavoyCasey N Kiona Blume 12/29/2015, 12:54 PM

## 2015-12-29 NOTE — Progress Notes (Signed)
Report received from Robyn, RN

## 2015-12-29 NOTE — Progress Notes (Signed)
Subjective: Patient seen and examined this morning.  Her husband is present.  Overall, her condition is improved.  Had headaches overnight and BP was elevated.  BP came down with Hydralazine PRN.  Otherwise, no new complaints.  Her blood cultures grew Gram positive rods in 1 of 2 bottles and none detected on BCID panel, likely reflects a contaminant.  Objective:  Vital signs in last 24 hours: Vitals:   12/29/15 0833 12/29/15 0900 12/29/15 1000 12/29/15 1107  BP: (!) 173/87 107/76 (!) 129/56 (!) 141/66  Pulse: 87 70 89 94  Resp: '16 12 18 '$ (!) 23  Temp: 98 F (36.7 C)     TempSrc: Oral     SpO2: 100% 100% 97% 100%  Weight:      Height:       Physical Exam Constitutional: no distress, lying in bed, conversational, comfortable HEENT: Atraumatic, normocephalic Pulmonary: CTAB, normal effort Extremities: Warm and well perfused. Bilateral calf tenderness that has been present for some time Neurological: alert and oriented, CN II - XII grossly intact. Moving extremities spontaneously. Psychiatric: Normal mood and affect  Assessment/Plan:  Acute Encephalopathy: unclear etiology with MRI showing bilateral cortical and subcortical infarcts.  Neurology is following, appreciate their recommendations.  Differential includes primary CNS vasculitis, atherosclerotic disease, embolic events, or endocarditis.  She is planned for cerebral angiogram on 10/16 and had LP done yesterday to further work up CNS vasculitis.  She does have multiple risk factors for ischemic stroke such as obesity, HLD, DM, HTN.  Yesterday, it was found that she had a distal DVT of the right lower extremity so embolic event was considered.  However, TEE done in 04/2015 did not show PFO or ASD.  She is getting TCD bubble study to rule this out.  Endocarditis seems unlikely based on Duke Criteria.  Afebrile since admission with 1/2 blood cultures currently reflecting a likely contaminant.  Will hold off on TEE at this time. - CSF  with no organism on Gram stain.  Glucose 98, protein 49, WBC 1 RBC 8.  Other data pending.  Not indicative of bacterial meningitis but LP performed after receiving broad spectrum antibiotics.  Will plan to continue antibiotics for another day then discontinue in light of 1/2 positive blood cultures.  Will continue acyclovir pending negative return of HSV PCR - Cerebral angiogram and TCD bubble study tomorrow - TTE pending - ANA pending, ESR 49, CRP 1.  Had negative autoimmune serologies in Dec 2016 (Care Everywhere) - consider pituitary imaging given elevated bHCG - multiple other hypercoagulable labs ordered by neurology. - continue home depakote 500 mg IV daily  - Neuro checks Q4H  Right LE Distal DVT: asymptomatic and given her complexity of presentation, we have opted to observe off full dose anticoagulation.  She currently has heparin ordered for DVT prophylaxis.   - continue observation with serial ultrasound every week for 2 weeks.  If there is proximal extension, then consider anticoagulation - if anticoagulation full dose needed prior to procedures on Monday, would recommend Heparin   DM2-- last Hgb A1C was 10.5 on 08/15/2015. On levemir 80 units QAM. Her glucose has been more elevated with giving her a diet.  She is only on moderate SSI - repeat A1C pending - continue SSI  - add Levemir 40units QAM  HTN -  HCTZ '25mg'$  and lisinopril at home -  BP better but elevated at times.   - HCTZ resumed yesterday.  Resume Lisinopril today - IV hydralazine PRN for elevated BP >  180   HLD - Crestor increased to '40mg'$  daily given LDL 153  Hx of Cerebrovascular Disease with Recurrent Strokes: multiple stroke risk factors.  MRI on 10/13 with small bilateral acute and subacute cerebral infarcts. -- stroke work up as above -- will hold her anti-platelets for now since getting LP.  Her outpatient neurologist recommended only continuing Plavix when seen in June 2017.  Normocytic anemia: Hemoglobin  may be decreased in setting of acute illness.  Currently stable at 9.5.  No active bleeding. - continue to monitor  FEN: stop fluids, replete lytes prn, Carb mod  VTE ppx: Heparin  Code Status: FULL   Dispo: Transfer to Med-Surg.  Anticipated discharge pending improvement in mental status and further work up.   Jule Ser, DO 12/29/2015, 12:26 PM Pager: [phone]

## 2015-12-29 NOTE — Progress Notes (Signed)
Gave report to Bristol-Myers SquibbConstance RN on [place]. Pt transferred in wheelchair on room air. Marisue Ivanobyn Aldena Worm RN

## 2015-12-29 NOTE — Progress Notes (Signed)
STROKE TEAM PROGRESS NOTE   SUBJECTIVE (INTERVAL HISTORY) Kelly Romero husband is at the bedside. Pt overall condition is improving. Stated that she had some HA overnight and tylenol helped and she does not have HA at this time. Pt blood culture showed 1 out of 2 bottles of positive G+ rods, but none detected on BCID panel, likely contamination.    OBJECTIVE Temp:  [97.9 F (36.6 C)-99 F (37.2 C)] 98 F (36.7 C) (10/15 0833) Pulse Rate:  [77-102] 87 (10/15 0833) Cardiac Rhythm: Normal sinus rhythm;Heart block (10/15 0833) Resp:  [12-20] 16 (10/15 0833) BP: (139-184)/(67-87) 173/87 (10/15 0833) SpO2:  [96 %-100 %] 100 % (10/15 0833)  CBC:  Recent Labs Lab 12/26/15 0430  12/28/15 0540 12/29/15 0528  WBC 8.0  < > 4.3 3.2*  NEUTROABS 6.3  --   --   --   HGB 12.5  < > 9.7* 9.5*  HCT 37.1  < > 30.3* 30.2*  MCV 82.6  < > 87.1 87.0  PLT 187  < > 173 149*  < > = values in this interval not displayed.  Basic Metabolic Panel:   Recent Labs Lab 12/27/15 1624 12/29/15 0528  NA 138 138  K 3.3* 3.7  CL 105 103  CO2 26 29  GLUCOSE 211* 327*  BUN 11 9  CREATININE 0.91 0.98  CALCIUM 8.4* 8.4*    Lipid Panel:     Component Value Date/Time   CHOL 212 (H) 12/28/2015 0714   TRIG 158 (H) 12/28/2015 0714   HDL 27 (L) 12/28/2015 0714   CHOLHDL 7.9 12/28/2015 0714   VLDL 32 12/28/2015 0714   LDLCALC 153 (H) 12/28/2015 0714   HgbA1c:  Lab Results  Component Value Date   HGBA1C 13.2 (H) 12/27/2015   Urine Drug Screen:     Component Value Date/Time   LABOPIA NONE DETECTED 12/26/2015 0650   COCAINSCRNUR NONE DETECTED 12/26/2015 0650   LABBENZ NONE DETECTED 12/26/2015 0650   AMPHETMU NONE DETECTED 12/26/2015 0650   THCU NONE DETECTED 12/26/2015 0650   LABBARB NONE DETECTED 12/26/2015 0650      IMAGING I have personally reviewed the radiological images below and agree with the radiology interpretations.  [name] Contrast 12/27/2015 1. Small bilateral acute and subacute  cerebral infarcts as described.  2. Motion degraded exam.   Ct Head Wo Contrast 12/26/2015  IMPRESSION: No acute intracranial abnormality. Remote small vessel ischemic infarcts are unchanged.   Dg Chest Portable 1 View 12/26/2015 IMPRESSION: Atelectasis right base. Question early pneumonia in this area. Lungs elsewhere clear. Cardiac silhouette within normal limits. Electronically Signed   By: Lowella Grip III M.D.   On: 12/26/2015 07:36   LE venous doppler  There is evidence of acute deep vein thrombosis involving a small segment of the right peroneal veins in the mid calf. There is no evidence of superficial vein thrombosis involving the right lower extremity. There is no evidence of deep or superficial vein thrombosis involving the left lower extremity.  There is no evidence of Baker's cysts bilaterally.  LP  Component     Latest Ref Rng & Units 12/28/2015  Tube #      3  Color, CSF     COLORLESS COLORLESS  Appearance, CSF     CLEAR CLEAR (A)  Supernatant      NOT INDICATED  RBC Count, CSF     0 /cu mm 8 (H)  WBC, CSF     0 - 5 /cu mm  1  Other Cells, CSF      TOO FEW TO COUNT, SMEAR AVAILABLE FOR REVIEW  Specimen Description      CSF  Special Requests      NONE  Gram Stain      CYTOSPIN SLIDE . Marland Kitchen Marland Kitchen  Report Status      12/28/2015 FINAL  Glucose, CSF     40 - 70 mg/dL 98 (H)  Total  Protein, CSF     15 - 45 mg/dL 49 (H)    Cerebral angio - pending  TCD bubble study - pending  TTE pending   PHYSICAL EXAM  Temp:  [97.9 F (36.6 C)-99 F (37.2 C)] 98 F (36.7 C) (10/15 0833) Pulse Rate:  [77-102] 87 (10/15 0833) Resp:  [12-20] 16 (10/15 0833) BP: (139-184)/(67-87) 173/87 (10/15 0833) SpO2:  [96 %-100 %] 100 % (10/15 0833)  General - Well nourished, well developed, not in acute distress.  Ophthalmologic - Fundi not visualized due to eye movement.  Cardiovascular - Regular rate and rhythm.  Mental Status -  Level of arousal and orientation to  time, place, and person were intact, but slow on thought processing. Language including expression, naming, repetition, comprehension was assessed and found intact, but bradyphasia, and mild psychomotor slowing. Attention span and concentration were normal for backward spelling, but no correct on calculation, slow in performing tasks. Recent and remote memory were impaired with 3/3 registration and 1/3 delayed recall Fund of Knowledge was assessed and was intact.  Cranial Nerves II - XII - II - Visual field intact OU. III, IV, VI - Extraocular movements intact. V - Facial sensation intact bilaterally. VII - Facial movement intact bilaterally. VIII - Hearing & vestibular intact bilaterally. X - Palate elevates symmetrically. XI - Chin turning & shoulder shrug intact bilaterally. XII - Tongue protrusion intact.  Motor Strength - [name] strength was normal in all extremities and pronator drift was absent.  Bulk was normal and fasciculations were absent.   Motor Tone - Muscle tone was assessed at the neck and appendages and was normal.  Reflexes - [name] reflexes were 1+ in all extremities and she had no pathological reflexes.  Sensory - Light touch, temperature/pinprick were assessed and were symmetrical.    Coordination - The patient had normal movements in the hands and feet with no ataxia or dysmetria.  Tremor was absent.  Gait and Station - deferred.    ASSESSMENT/PLAN Kelly Romero is a 50 y.o. female with history of hypertension, diabetes mellitus, previous stroke / TIA, depression, and hyperlipidemia presenting with altered mental status, fever, achiness, agitation, and speech difficulties. She did not receive IV t-PA due to unknown time of onset.  Strokes:  Bilateral infarcts cortical and subcortical infarcts, etiology unclear. DDx include: 1. Primary CNS vasculitis - pt has HA and cognitive impairment and recurrent strokes over the last two years, young  age, fits well with the clinical picture - will need further LP and cerebral angio for evaluation. Will discuss with pt and family regarding leptomeningeal and brain biopsy if needed. 2. Atherosclerosis - pt does have multiple stroke risk factors including obesity, HTN, HLD, uncontrolled DM, but with HA, cognitive impairment, young age and concurrent subcortical and cortical infarcts not typical for atherosclerosis 3. Paradoxical emboli - pt has DVT at right LE this time, paradoxical emboli needs to be considered. However, pt had TEE in 04/2015 showed no PFO or ASD. However, will do TCD bubble study to further rule out.  4. Endocarditis - pt had fever PTA, with embolic pattern stroke and blood culture 1/2 positive, endocarditis needs to be ruled out. However, she was afebrile since admission, positive blood culture likely contamination and clinical picture did not support endocarditis.  Resultant  HA, cognitive impairment  MRI - Small bilateral acute and subacute cerebral infarcts  Lumbar puncture - 12/28/2015 - glucose 98 (H) ; Protein 49 (H) ; 1 WBC, 8 RBC; No organisms. Other CSF labs pending  LE Dopplers - right distal DVT, asymptomatic  TCD bubble study - pending  Hypercoagulable panel - pending  Cerebral angio - plan for Monday   2D Echo - pending  LDL - 153  HgbA1c pending  VTE prophylaxis - heparin subq Diet Carb Modified Fluid consistency: Thin; Room service appropriate? Yes Diet NPO time specified  clopidogrel 75 mg daily prior to admission, now on ASA '325mg'$  for stroke prevention.  Patient counseled to be compliant with Kelly Romero antithrombotic medications  Ongoing aggressive stroke risk factor management  Therapy recommendations:  pending  Disposition:  Pending  Follow up with Dr. Tomi Likens after discharge.  Right LE distal DVT, asymptomatic   Due to complexity of pt clinic picture and work up, may consider to continue observation with serial ultrasound (every week for 2  weeks). If DVT has proximal extension, then consider anticoagulation.   If need anticoagulation, recommend to consider heparin drip first due to cerebral angio on Monday.   History of stroke  11/2014 - dysarthria and right arm numbness - TIA - but elevated BP and glucose with UTI  02/2015 - HA and dysarthria - MRI showed small left occipital infarct - autoimmune and lupus work up negative  04/2015 - MRI right frontal infarct - TEE negative, no PFO  08/2015 - encephalopathy, HA and ? Seizure - EEG neg, MRI ? Right frontal infarct - A1C 10.5, TTE neg, LDL 231 - put on depakote  08/2015 - follow up with Dr. Tomi Likens - on ASA and depakote and amitriptyline  09/2015 - ED visit for left facial nubmness - glucose > 500, considered DKA  ? Endocarditis  Fever PTA  Afebrile after admission  Blood culture - 1 out of 2 showed G+ rod, however, negative for BCID, likely contamination  Less likely endocarditis - agree to hold off TEE   ? Meningitis   Fever at home with neuro change and stroke  Blood culture - 1 out of 2 showed G+ rod, however, negative for BCID, likely contamination  LP unremarkable, but pt has been on Abx for 3 days   LP pattern not consistent with partial treated meningitis, but agree with one more day of Abx due to suspicious positive blood cultures  Consider d/c acyclovir once HSV PCR result available.  Migraine   On amitriptyline and depakote at home  Followed with Dr. Tomi Likens in clinic  On IV depakote now  Hypertension  Blood pressure somewhat elevated at times.  Long-term BP goal normotensive  Hyperlipidemia  Home meds:  Crestor 20 mg daily resumed in hospital  LDL 153, goal < 70  Increase dose to 40 mg daily.  Continue statin at discharge  Diabetes  HgbA1c pending, goal < 7.0  Uncontrolled  SSI  Other Stroke Risk Factors  Obesity, Body mass index is 38.18 kg/m., recommend weight loss, diet and exercise as appropriate   Family hx stroke  (father)  Other Active Problems  Anemia - 9.7 / 30.3  Hypokalemia - 3.3 - supplement -> 3.7   Homocysteine, serum -  Cardiolipin Antibodies -  Lupus anticoagulant -  Beta-2-glycoprotein -   ESR - 49 (H) C reactive protein - 1.0 (H)  ANA Ab IFA -   HIV - non reactive RPR - non reactive   Hospital day # 3  Rosalin Hawking, MD PhD Stroke Neurology 12/29/2015 11:39 AM   To contact Stroke Continuity provider, please refer to [email or web address] After hours, contact General Neurology

## 2015-12-30 ENCOUNTER — Inpatient Hospital Stay (HOSPITAL_COMMUNITY): Payer: 59

## 2015-12-30 ENCOUNTER — Encounter (HOSPITAL_COMMUNITY): Payer: Self-pay | Admitting: General Surgery

## 2015-12-30 DIAGNOSIS — R29898 Other symptoms and signs involving the musculoskeletal system: Secondary | ICD-10-CM

## 2015-12-30 HISTORY — PX: IR GENERIC HISTORICAL: IMG1180011

## 2015-12-30 LAB — CULTURE, BLOOD (ROUTINE X 2)

## 2015-12-30 LAB — CBC
HEMATOCRIT: 29.3 % — AB (ref 36.0–46.0)
HEMOGLOBIN: 9.3 g/dL — AB (ref 12.0–15.0)
MCH: 27.4 pg (ref 26.0–34.0)
MCHC: 31.7 g/dL (ref 30.0–36.0)
MCV: 86.4 fL (ref 78.0–100.0)
Platelets: 154 10*3/uL (ref 150–400)
RBC: 3.39 MIL/uL — ABNORMAL LOW (ref 3.87–5.11)
RDW: 13.1 % (ref 11.5–15.5)
WBC: 3.4 10*3/uL — ABNORMAL LOW (ref 4.0–10.5)

## 2015-12-30 LAB — BASIC METABOLIC PANEL
Anion gap: 7 (ref 5–15)
BUN: 13 mg/dL (ref 6–20)
CHLORIDE: 102 mmol/L (ref 101–111)
CO2: 29 mmol/L (ref 22–32)
CREATININE: 1.03 mg/dL — AB (ref 0.44–1.00)
Calcium: 8.8 mg/dL — ABNORMAL LOW (ref 8.9–10.3)
GFR calc non Af Amer: >60 mL/min (ref >60)
GLUCOSE: 210 mg/dL — AB (ref 65–99)
Potassium: 3.5 mmol/L (ref 3.5–5.1)
Sodium: 138 mmol/L (ref 135–145)

## 2015-12-30 LAB — GLUCOSE, CAPILLARY
Glucose-Capillary: 120 mg/dL — ABNORMAL HIGH (ref 65–99)
Glucose-Capillary: 117 mg/dL — ABNORMAL HIGH (ref 65–99)
Glucose-Capillary: 272 mg/dL — ABNORMAL HIGH (ref 65–99)
Glucose-Capillary: 308 mg/dL — ABNORMAL HIGH (ref 65–99)

## 2015-12-30 LAB — BETA-2-GLYCOPROTEIN I ABS, IGG/M/A

## 2015-12-30 LAB — VDRL, CSF: VDRL Quant, CSF: NONREACTIVE

## 2015-12-30 LAB — CARDIOLIPIN ANTIBODIES, IGG, IGM, IGA
Anticardiolipin IgA: <9 APL U/mL (ref 0–11)
Anticardiolipin IgM: <9 MPL U/mL (ref 0–12)

## 2015-12-30 LAB — HERPES SIMPLEX VIRUS(HSV) DNA BY PCR
HSV 1 DNA: NEGATIVE
HSV 2 DNA: NEGATIVE

## 2015-12-30 LAB — ANGIOTENSIN CONVERTING ENZYME: ANGIOTENSIN-CONVERTING ENZYME: 26 U/L (ref 14–82)

## 2015-12-30 LAB — HOMOCYSTEINE: Homocysteine: 9.6 umol/L (ref 0.0–15.0)

## 2015-12-30 LAB — ANTINUCLEAR ANTIBODIES, IFA: ANTINUCLEAR ANTIBODIES, IFA: NEGATIVE

## 2015-12-30 MED ORDER — HEPARIN SODIUM (PORCINE) 1000 UNIT/ML IJ SOLN
INTRAMUSCULAR | Status: AC
  Filled 2015-12-30: qty 2

## 2015-12-30 MED ORDER — IOPAMIDOL (ISOVUE-300) INJECTION 61%
INTRAVENOUS | Status: DC
Start: 2015-12-30 — End: 2015-12-30
  Filled 2015-12-30: qty 50

## 2015-12-30 MED ORDER — LABETALOL HCL 5 MG/ML IV SOLN
INTRAVENOUS | Status: AC | PRN
  Administered 2015-12-30: 5 mg via INTRAVENOUS

## 2015-12-30 MED ORDER — IOPAMIDOL (ISOVUE-300) INJECTION 61%
INTRAVENOUS | Status: AC
  Administered 2015-12-30: 75 mL
  Filled 2015-12-30: qty 150

## 2015-12-30 MED ORDER — DEXTROSE 5 % IV SOLN
2.0000 g | Freq: Two times a day (BID) | INTRAVENOUS | Status: DC
  Administered 2015-12-30 (×2): 2 g via INTRAVENOUS
  Filled 2015-12-30 (×5): qty 2

## 2015-12-30 MED ORDER — DEXTROSE 5 % IV SOLN
700.0000 mg | Freq: Three times a day (TID) | INTRAVENOUS | Status: DC
  Administered 2015-12-30 – 2015-12-31 (×3): 700 mg via INTRAVENOUS
  Filled 2015-12-30 (×4): qty 14

## 2015-12-30 MED ORDER — FENTANYL CITRATE (PF) 100 MCG/2ML IJ SOLN
INTRAMUSCULAR | Status: AC
  Filled 2015-12-30: qty 2

## 2015-12-30 MED ORDER — MIDAZOLAM HCL 2 MG/2ML IJ SOLN
INTRAMUSCULAR | Status: AC | PRN
  Administered 2015-12-30: 0.5 mg via INTRAVENOUS
  Administered 2015-12-30: 1 mg via INTRAVENOUS

## 2015-12-30 MED ORDER — LABETALOL HCL 5 MG/ML IV SOLN
INTRAVENOUS | Status: AC
  Filled 2015-12-30: qty 4

## 2015-12-30 MED ORDER — SODIUM CHLORIDE 0.9 % IV SOLN
INTRAVENOUS | Status: AC
Start: 2015-12-30 — End: 2015-12-30

## 2015-12-30 MED ORDER — FENTANYL CITRATE (PF) 100 MCG/2ML IJ SOLN
INTRAMUSCULAR | Status: AC | PRN
  Administered 2015-12-30 (×2): 25 ug via INTRAVENOUS

## 2015-12-30 MED ORDER — HEPARIN SODIUM (PORCINE) 1000 UNIT/ML IJ SOLN
INTRAMUSCULAR | Status: AC | PRN
  Administered 2015-12-30: 1000 [IU] via INTRAVENOUS

## 2015-12-30 MED ORDER — LIDOCAINE HCL 1 % IJ SOLN
INTRAMUSCULAR | Status: AC | PRN
  Administered 2015-12-30: 10 mL

## 2015-12-30 MED ORDER — LIDOCAINE HCL 1 % IJ SOLN
INTRAMUSCULAR | Status: AC
  Filled 2015-12-30: qty 20

## 2015-12-30 MED ORDER — HYDRALAZINE HCL 20 MG/ML IJ SOLN
INTRAMUSCULAR | Status: AC | PRN
  Administered 2015-12-30 (×3): 5 mg via INTRAVENOUS

## 2015-12-30 MED ORDER — HYDRALAZINE HCL 20 MG/ML IJ SOLN
INTRAMUSCULAR | Status: AC
Start: 2015-12-30 — End: 2015-12-30
  Filled 2015-12-30: qty 1

## 2015-12-30 MED ORDER — MIDAZOLAM HCL 2 MG/2ML IJ SOLN
INTRAMUSCULAR | Status: AC
  Filled 2015-12-30: qty 2

## 2015-12-30 MED ORDER — OXYCODONE-ACETAMINOPHEN 5-325 MG PO TABS
1.0000 | ORAL_TABLET | Freq: Four times a day (QID) | ORAL | Status: DC | PRN
  Administered 2015-12-30 – 2015-12-31 (×4): 1 via ORAL
  Filled 2015-12-30 (×5): qty 1

## 2015-12-30 NOTE — Progress Notes (Signed)
*  PRELIMINARY RESULTS* Vascular Ultrasound Transcranial Doppler with Bubbles has been completed with Dr. Pearlean BrownieSethi. Trivial amount of High Intensity Transient Signals (HITS) heard with Valsalva, therefore there is a trivial Patent Foramen Ovale (PFO) which is clinically insignificant.  12/30/2015 3:45 PM Gertie FeyMichelle Shakevia Sarris, BS, RVT, RDCS, RDMS

## 2015-12-30 NOTE — Sedation Documentation (Signed)
Right groin checked with Rockne CoonsKatelin, RN. Dressing dry and intact.

## 2015-12-30 NOTE — Sedation Documentation (Signed)
5 Fr. Exoseal to right groin 

## 2015-12-30 NOTE — Procedures (Signed)
4vessel cerebral arteriogram RT CFA approach. Findings. 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 2.Approx 65 top 70 % stenosis of LT MCA sup division 3.Tapered stenosis of distal Rt pericallosal  In the region of splenium of CC

## 2015-12-30 NOTE — Consult Note (Signed)
Chief Complaint: CNS vasculitis  Referring Physician:Dr. Rosalin Hawking  Supervising Physician: Luanne Bras  Patient Status: In-pt / Out-pt  HPI: Kelly Romero is an 50 y.o. female who was recently admitted secondary to dysarthria and altered mental status. She does have a history of TIAs and CVAs. She has been evaluated by neurology and they are suspicious for primary CNS vasculitis. They have recommended a cerebral angiogram for further evaluation to help confirm this diagnosis. The patient admits to having a headache today. She still struggles with word finding at times. She did take Plavix but has not received it at least since being admitted.  Past Medical History:  Past Medical History:  Diagnosis Date  . Diabetes mellitus without complication ([place])   . Headache   . Hyperlipidemia   . Hypertension   . TIA (transient ischemic attack)     Past Surgical History:  Past Surgical History:  Procedure Laterality Date  . APPENDECTOMY    . CHOLECYSTECTOMY    . FOOT GANGLION EXCISION    . HERNIA REPAIR    . TEE WITHOUT CARDIOVERSION N/A 04/19/2015   Procedure: TRANSESOPHAGEAL ECHOCARDIOGRAM (TEE);  Surgeon: Jerline Pain, MD;  Location: [facility] ENDOSCOPY;  Service: Cardiovascular;  Laterality: N/A;  . VAGINAL HYSTERECTOMY      Family History:  Family History  Problem Relation Age of Onset  . Stroke Father     26s  . Heart attack Father 16  . COPD Mother   . Heart failure Brother   . Cancer Maternal Grandmother     unknown   . COPD    . Heart failure Maternal Grandfather   . Hypertension Maternal Grandfather   . Cancer Paternal Grandfather     lung  . Diabetes Paternal Grandmother     Social History:  reports that she has never smoked. She has never used smokeless tobacco. She reports that she does not drink alcohol or use drugs.  Allergies:  Allergies  Allergen Reactions  . Erythromycin Itching    Medications: Medications reviewed in Epic  Please HPI for  pertinent positives, otherwise complete 10 system ROS negative.  Mallampati Score: MD Evaluation Airway: WNL Heart: WNL Abdomen: WNL Chest/ Lungs: WNL ASA  Classification: 2 Mallampati/Airway Score: Two  Physical Exam: BP (!) 147/60 (BP Location: Right Arm)   Pulse 89   Temp 97.9 F (36.6 C) (Oral)   Resp 18   Ht '5\' 4"'$  (1.626 m)   Wt 222 lb 7.1 oz (100.9 kg)   SpO2 97%   BMI 38.18 kg/m  Body mass index is 38.18 kg/m. General: pleasant, obese white female who is laying in bed in NAD HEENT: head is normocephalic, atraumatic.  Sclera are noninjected.  PERRL.  Ears and nose without any masses or lesions.  Mouth is pink and moist Heart: regular, rate, and rhythm.  Normal s1,s2. No obvious murmurs, gallops, or rubs noted.  Palpable radial and pedal pulses bilaterally Lungs: CTAB, no wheezes, rhonchi, or rales noted.  Respiratory effort nonlabored Abd: soft, NT, ND, +BS, no masses, hernias, or organomegaly Neuro: grossly intact, but some word finding difficulties Psych: A&Ox3 with an appropriate affect.   Labs: Results for orders placed or performed during the hospital encounter of 12/26/15 (from the past 48 hour(s))  Miscellaneous [facility] test (send-out)     Status: None   Collection Time: 12/28/15 11:40 AM  Result Value Ref Range   [facility] test code [id number]     Comment: CORRECTED ON 10/15 AT 1212: PREVIOUSLY  REPORTED AS CSF   [facility] test name CMV PCR IGM IGG,VZV PCR IGM IGG     Comment: CORRECTED ON 10/15 AT 1212: PREVIOUSLY REPORTED AS CSF   Source ([facility]) 1PO    Misc [facility] result COMMENT     Comment: (NOTE) Performed At: [facility] [place], [place] [id number] Lindon Romp MD DD:[id number]   Glucose, capillary     Status: Abnormal   Collection Time: 12/28/15 12:15 PM  Result Value Ref Range   Glucose-Capillary 286 (H) 65 - 99 mg/dL  Glucose, CSF     Status: Abnormal   Collection Time: 12/28/15  4:11 PM    Result Value Ref Range   Glucose, CSF 98 (H) 40 - 70 mg/dL  Protein, CSF     Status: Abnormal   Collection Time: 12/28/15  4:11 PM  Result Value Ref Range   Total  Protein, CSF 49 (H) 15 - 45 mg/dL  CSF cell count with differential     Status: Abnormal   Collection Time: 12/28/15  4:11 PM  Result Value Ref Range   Tube # 3    Color, CSF COLORLESS COLORLESS   Appearance, CSF CLEAR (A) CLEAR   Supernatant NOT INDICATED    RBC Count, CSF 8 (H) 0 /cu mm   WBC, CSF 1 0 - 5 /cu mm   Other Cells, CSF TOO FEW TO COUNT, SMEAR AVAILABLE FOR REVIEW     Comment: RARE NEUTROPHIL, RARE MONOCYTE, RARE LYMPHOCYTE  Gram stain     Status: None   Collection Time: 12/28/15  4:11 PM  Result Value Ref Range   Specimen Description CSF    Special Requests NONE    Gram Stain CYTOSPIN SLIDE NO WBC SEEN NO ORGANISMS SEEN     Report Status 12/28/2015 FINAL   Glucose, capillary     Status: Abnormal   Collection Time: 12/28/15  5:22 PM  Result Value Ref Range   Glucose-Capillary 199 (H) 65 - 99 mg/dL   Comment 1 Notify RN   Aerobic/Anaerobic Culture (surgical/deep wound)     Status: None (Preliminary result)   Collection Time: 12/28/15  8:47 PM  Result Value Ref Range   Specimen Description CSF    Special Requests NONE    Culture NO GROWTH < 24 HOURS    Report Status PENDING   Glucose, capillary     Status: Abnormal   Collection Time: 12/28/15 10:11 PM  Result Value Ref Range   Glucose-Capillary 275 (H) 65 - 99 mg/dL   Comment 1 Notify RN   CBC     Status: Abnormal   Collection Time: 12/29/15  5:28 AM  Result Value Ref Range   WBC 3.2 (L) 4.0 - 10.5 K/uL   RBC 3.47 (L) 3.87 - 5.11 MIL/uL   Hemoglobin 9.5 (L) 12.0 - 15.0 g/dL   HCT 30.2 (L) 36.0 - 46.0 %   MCV 87.0 78.0 - 100.0 fL   MCH 27.4 26.0 - 34.0 pg   MCHC 31.5 30.0 - 36.0 g/dL   RDW 13.1 11.5 - 15.5 %   Platelets 149 (L) 150 - 400 K/uL  Comprehensive metabolic panel     Status: Abnormal   Collection Time: 12/29/15  5:28 AM   Result Value Ref Range   Sodium 138 135 - 145 mmol/L   Potassium 3.7 3.5 - 5.1 mmol/L   Chloride 103 101 - 111 mmol/L   CO2 29 22 - 32 mmol/L   Glucose, Bld 327 (H) 65 -  99 mg/dL   BUN 9 6 - 20 mg/dL   Creatinine, Ser 0.98 0.44 - 1.00 mg/dL   Calcium 8.4 (L) 8.9 - 10.3 mg/dL   Total Protein 4.6 (L) 6.5 - 8.1 g/dL   Albumin 2.1 (L) 3.5 - 5.0 g/dL   AST 13 (L) 15 - 41 U/L   ALT 11 (L) 14 - 54 U/L   Alkaline Phosphatase 58 38 - 126 U/L   Total Bilirubin 0.6 0.3 - 1.2 mg/dL   GFR calc non Af Amer >60 >60 mL/min   GFR calc Af Amer >60 >60 mL/min    Comment: (NOTE) The eGFR has been calculated using the CKD EPI equation. This calculation has not been validated in all clinical situations. eGFR's persistently <60 mL/min signify possible Chronic Kidney Disease.    Anion gap 6 5 - 15  Glucose, capillary     Status: Abnormal   Collection Time: 12/29/15  7:21 AM  Result Value Ref Range   Glucose-Capillary 288 (H) 65 - 99 mg/dL   Comment 1 Notify RN    Comment 2 Document in Chart   Glucose, capillary     Status: Abnormal   Collection Time: 12/29/15 11:58 AM  Result Value Ref Range   Glucose-Capillary 213 (H) 65 - 99 mg/dL   Comment 1 Notify RN    Comment 2 Document in Chart   Glucose, capillary     Status: Abnormal   Collection Time: 12/29/15  5:16 PM  Result Value Ref Range   Glucose-Capillary 239 (H) 65 - 99 mg/dL   Comment 1 Notify RN   Glucose, capillary     Status: Abnormal   Collection Time: 12/29/15 11:28 PM  Result Value Ref Range   Glucose-Capillary 189 (H) 65 - 99 mg/dL  CBC     Status: Abnormal   Collection Time: 12/30/15  2:19 AM  Result Value Ref Range   WBC 3.4 (L) 4.0 - 10.5 K/uL   RBC 3.39 (L) 3.87 - 5.11 MIL/uL   Hemoglobin 9.3 (L) 12.0 - 15.0 g/dL   HCT 29.3 (L) 36.0 - 46.0 %   MCV 86.4 78.0 - 100.0 fL   MCH 27.4 26.0 - 34.0 pg   MCHC 31.7 30.0 - 36.0 g/dL   RDW 13.1 11.5 - 15.5 %   Platelets 154 150 - 400 K/uL  Basic metabolic panel     Status:  Abnormal   Collection Time: 12/30/15  2:19 AM  Result Value Ref Range   Sodium 138 135 - 145 mmol/L   Potassium 3.5 3.5 - 5.1 mmol/L   Chloride 102 101 - 111 mmol/L   CO2 29 22 - 32 mmol/L   Glucose, Bld 210 (H) 65 - 99 mg/dL   BUN 13 6 - 20 mg/dL   Creatinine, Ser 1.03 (H) 0.44 - 1.00 mg/dL   Calcium 8.8 (L) 8.9 - 10.3 mg/dL   GFR calc non Af Amer >60 >60 mL/min   GFR calc Af Amer >60 >60 mL/min    Comment: (NOTE) The eGFR has been calculated using the CKD EPI equation. This calculation has not been validated in all clinical situations. eGFR's persistently <60 mL/min signify possible Chronic Kidney Disease.    Anion gap 7 5 - 15  Glucose, capillary     Status: Abnormal   Collection Time: 12/30/15  7:57 AM  Result Value Ref Range   Glucose-Capillary 120 (H) 65 - 99 mg/dL    Imaging: Dg Fluoro Guide Lumbar Puncture  Result Date: 12/28/2015  CLINICAL DATA:  50 year old female with altered mental status and possible encephalitis. EXAM: DIAGNOSTIC LUMBAR PUNCTURE UNDER FLUOROSCOPIC GUIDANCE FLUOROSCOPY TIME:  Fluoroscopy Time:  0 minutes 54 seconds Number of Acquired Spot Images: 1 PROCEDURE: Informed consent was obtained from the patient prior to the procedure, including potential complications of headache, allergy, and pain. With the patient prone, the lower back was prepped with Betadine. 1% Lidocaine was used for local anesthesia. Lumbar puncture was performed at the L3-4 level using a 20 gauge needle with return of clear colorless CSF with an opening pressure of 28 cm water. 12 CCs of CSF were obtained for laboratory studies. The patient tolerated the procedure well and there were no apparent complications. IMPRESSION: Fluoroscopic guided lumbar puncture without immediate complication. Electronically Signed   By: Margarette Canada M.D.   On: 12/28/2015 17:21    Assessment/Plan 1. Primary CNS vasculitis -We'll plan to proceed with diagnostic cerebral angiogram today. -Labs and vital  signs have been reviewed. -She has been nothing by mouth since midnight last night. -Risks and Benefits discussed with the patient including, but not limited to bleeding, infection, vascular injury or contrast induced renal failure. All of the patient's questions were answered, patient is agreeable to proceed. Consent signed and in chart.   Thank you for this interesting consult.  I greatly enjoyed meeting Kelly Romero and look forward to participating in their care.  A copy of this report was sent to the requesting provider on this date.  Electronically Signed: Henreitta Cea 12/30/2015, 10:19 AM   I spent a total of 40 Minutes    in face to face in clinical consultation, greater than 50% of which was counseling/coordinating care for primary CNS vasculitis

## 2015-12-30 NOTE — Progress Notes (Signed)
Received call from infection prevention that pt can be taken off droplet precaution. Dr. Antony ContrasGuilloud updated and order discontinued. Will continue to monitor

## 2015-12-30 NOTE — Progress Notes (Signed)
STROKE TEAM PROGRESS NOTE   SUBJECTIVE (INTERVAL HISTORY) Patient just returned from cerebral catheter angio. Husband and daughter are in room. I discussed angio resluts with them and answered questions. I did extensive review of her chart and prior presentations in Sep 2016, Dec 2016 and now.   OBJECTIVE Temp:  [97.8 F (36.6 C)-98.2 F (36.8 C)] 97.9 F (36.6 C) (10/16 0342) Pulse Rate:  [85-99] 99 (10/16 1117) Cardiac Rhythm: Normal sinus rhythm (10/16 1116) Resp:  [18-20] 20 (10/16 1117) BP: (129-193)/(52-96) 193/96 (10/16 1117) SpO2:  [97 %-100 %] 100 % (10/16 1117)  CBC:  Recent Labs Lab 12/26/15 0430  12/29/15 0528 12/30/15 0219  WBC 8.0  < > 3.2* 3.4*  NEUTROABS 6.3  --   --   --   HGB 12.5  < > 9.5* 9.3*  HCT 37.1  < > 30.2* 29.3*  MCV 82.6  < > 87.0 86.4  PLT 187  < > 149* 154  < > = values in this interval not displayed.  Basic Metabolic Panel:   Recent Labs Lab 12/29/15 0528 12/30/15 0219  NA 138 138  K 3.7 3.5  CL 103 102  CO2 29 29  GLUCOSE 327* 210*  BUN 9 13  CREATININE 0.98 1.03*  CALCIUM 8.4* 8.8*    Lipid Panel:     Component Value Date/Time   CHOL 212 (H) 12/28/2015 0714   TRIG 158 (H) 12/28/2015 0714   HDL 27 (L) 12/28/2015 0714   CHOLHDL 7.9 12/28/2015 0714   VLDL 32 12/28/2015 0714   LDLCALC 153 (H) 12/28/2015 0714   HgbA1c:  Lab Results  Component Value Date   HGBA1C 13.0 (H) 12/28/2015   Urine Drug Screen:     Component Value Date/Time   LABOPIA NONE DETECTED 12/26/2015 0650   COCAINSCRNUR NONE DETECTED 12/26/2015 0650   LABBENZ NONE DETECTED 12/26/2015 0650   AMPHETMU NONE DETECTED 12/26/2015 0650   THCU NONE DETECTED 12/26/2015 0650   LABBARB NONE DETECTED 12/26/2015 0650      IMAGING Mr Brain W Wo Contrast 12/27/2015 1. Small bilateral acute and subacute cerebral infarcts as described.  2. Motion degraded exam.   Ct Head Wo Contrast 12/26/2015  IMPRESSION: No acute intracranial abnormality. Remote small  vessel ischemic infarcts are unchanged.   Dg Chest Portable 1 View 12/26/2015 IMPRESSION: Atelectasis right base. Question early pneumonia in this area. Lungs elsewhere clear. Cardiac silhouette within normal limits. Electronically Signed   By: Lowella Grip III M.D.   On: 12/26/2015 07:36   LE venous doppler  There is evidence of acute deep vein thrombosis involving a small segment of the right peroneal veins in the mid calf. There is no evidence of superficial vein thrombosis involving the right lower extremity. There is no evidence of deep or superficial vein thrombosis involving the left lower extremity.  There is no evidence of Baker's cysts bilaterally.  LP  Component     Latest Ref Rng & Units 12/28/2015  Tube #      3  Color, CSF     COLORLESS COLORLESS  Appearance, CSF     CLEAR CLEAR (A)  Supernatant      NOT INDICATED  RBC Count, CSF     0 /cu mm 8 (H)  WBC, CSF     0 - 5 /cu mm 1  Other Cells, CSF      TOO FEW TO COUNT, SMEAR AVAILABLE FOR REVIEW  Specimen Description      CSF  Special  Requests      NONE  Gram Stain      CYTOSPIN SLIDE . Marland Kitchen Marland Kitchen  Report Status      12/28/2015 FINAL  Glucose, CSF     40 - 70 mg/dL 98 (H)  Total  Protein, CSF     15 - 45 mg/dL 49 (H)    Cerebral angio -  1.Severe stenosis of prox PCA P1 seg. 2.Approx 65 top 70 % stenosis of LT MCA sup division 3.Tapered stenosis of distal Rt pericallosal  In the region of splenium of CC  TCD bubble study -  Weakly positive for trivial clinically not significant right to left shunt with valsalva only.  TTE  - Left ventricle: The cavity size was normal. Wall thickness was increased in a pattern of mild LVH. Systolic function was normal. The estimated ejection fraction was in the range of 55% to 60%. Images were inadequate for LV wall motion assessment. Diastolic dysfunction, grade indeterminate. - Aortic valve: Mildly calcified annulus. Trileaflet. - Mitral valve: Mildly calcified annulus.  There was mild regurgitation.   PHYSICAL EXAM General - Well nourished, well developed, not in acute distress.  Ophthalmologic - Fundi not visualized due to eye movement.  Cardiovascular - Regular rate and rhythm.  Mental Status -  Level of arousal and orientation to time, place, and person were intact, but slow on thought processing. Language including expression, naming, repetition, comprehension was assessed and found intact, but bradyphasia, and mild psychomotor slowing. Attention span and concentration were normal for backward spelling, but no correct on calculation, slow in performing tasks. Recent and remote memory were impaired with 3/3 registration and 1/3 delayed recall Fund of Knowledge was assessed and was intact.  Cranial Nerves II - XII - II - Visual field intact OU. III, IV, VI - Extraocular movements intact. V - Facial sensation intact bilaterally. VII - Facial movement intact bilaterally. VIII - Hearing & vestibular intact bilaterally. X - Palate elevates symmetrically. XI - Chin turning & shoulder shrug intact bilaterally. XII - Tongue protrusion intact.  Motor Strength - [name] strength was normal in all extremities and pronator drift was absent.  Bulk was normal and fasciculations were absent.   Motor Tone - Muscle tone was assessed at the neck and appendages and was normal.  Reflexes - [name] reflexes were 1+ in all extremities and she had no pathological reflexes.  Sensory - Light touch, temperature/pinprick were assessed and were symmetrical.    Coordination - The patient had normal movements in the hands and feet with no ataxia or dysmetria.  Tremor was absent.  Gait and Station - deferred.    ASSESSMENT/PLAN Ms. Kelly Romero is a 50 y.o. female with history of hypertension, diabetes mellitus, previous stroke / TIA, depression, and hyperlipidemia presenting with altered mental status, fever, achiness, agitation, and speech  difficulties. She did not receive IV t-PA due to unknown time of onset.  Strokes:  Bilateral infarcts cortical and subcortical infarcts, etiology unclear. DDx include: 1. Primary CNS vasculitis - pt has HA and cognitive impairment and recurrent strokes over the last two years, young age, fits well with the clinical picture - will need further LP and cerebral angio for evaluation. Will discuss with pt and family regarding leptomeningeal and brain biopsy if needed. 2. Atherosclerosis - pt does have multiple stroke risk factors including obesity, HTN, HLD, uncontrolled DM, but with HA, cognitive impairment, young age and concurrent subcortical and cortical infarcts not typical for atherosclerosis 3. Paradoxical emboli -  pt has DVT at right LE this time, paradoxical emboli needs to be considered. However, pt had TEE in 04/2015 showed no PFO or ASD. However, will do TCD bubble study to further rule out. 4. Endocarditis - pt had fever PTA, with embolic pattern stroke and blood culture 1/2 positive, endocarditis needs to be ruled out. However, she was afebrile since admission, positive blood culture likely contamination and clinical picture did not support endocarditis.  Resultant  HA, cognitive impairment  MRI - Small bilateral acute and subacute cerebral infarcts  Lumbar puncture - 12/28/2015 - glucose 98 (H) ; Protein 49 (H) ; 1 WBC, 8 RBC; No organisms. Other CSF labs  pending  LE Dopplers - right distal DVT, asymptomatic  TCD bubble study -  Positive for trivial right to left shunt during valsalva release only  Hypercoagulable panel - see below Cerebral angio - 1.Severe stenosis of prox PCA P1 seg. 2.Approx 65 top 70 % stenosis of LT MCA sup division  3.Tapered stenosis of distal Rt pericallosal  In the region of splenium of CC   2D Echo -EF 55-60%. No source of embolus. LA AP 37   LDL - 153  HgbA1c 13.0  VTE prophylaxis - heparin subq Diet NPO time specified  clopidogrel 75 mg daily  prior to admission, now on ASA 361m for stroke prevention.  Patient counseled to be compliant with her antithrombotic medications  Ongoing aggressive stroke risk factor management  Therapy recommendations: pending  Disposition:  home  Follow up with Dr. JTomi Likensafter discharge.  Right LE distal DVT, asymptomatic   Due to complexity of pt clinic picture and work up, may consider to continue observation with serial ultrasound (every week for 2 weeks). If DVT has proximal extension, then consider anticoagulation.   If need anticoagulation, recommend to consider heparin drip first due to cerebral angio on Monday.   History of stroke  11/2014 - dysarthria and right arm numbness - TIA - but elevated BP and glucose with UTI  02/2015 - HA and dysarthria - MRI showed small left occipital infarct - autoimmune and lupus work up negative  04/2015 - MRI right frontal infarct - TEE negative, no PFO  08/2015 - encephalopathy, HA and ? Seizure - EEG neg, MRI ? Right frontal infarct - A1C 10.5, TTE neg, LDL 231 - put on depakote  08/2015 - follow up with Dr. JTomi Likens- on ASA and depakote and amitriptyline  09/2015 - ED visit for left facial nubmness - glucose > 500, considered DKA  ? Endocarditis  Fever PTA  Afebrile after admission  Blood culture - 1 out of 2 showed G+ rod, however, negative for BCID, likely contamination  Less likely endocarditis - agree to hold off TEE   ? Meningitis   Fever at home with neuro change and stroke  Blood culture - 1 out of 2 showed G+ rod, however, negative for BCID, likely contamination  LP unremarkable, but pt has been on Abx for 3 days   LP pattern not consistent with partial treated meningitis, but agree with one more day of Abx due to suspicious positive blood cultures  Consider d/c acyclovir once HSV PCR result available.  Migraine   On amitriptyline and depakote at home  Followed with Dr. JTomi Likensin clinic  On IV depakote  now  Hypertension  Blood pressure somewhat elevated at times.  Long-term BP goal normotensive  Hyperlipidemia  Home meds:  Crestor 20 mg daily resumed in hospital  LDL 153, goal < 70  Increase dose to 40 mg daily.  Continue statin at discharge  Diabetes  HgbA1c 13.0, goal < 7.0  Uncontrolled  SSI  Other Stroke Risk Factors  Obesity, Body mass index is 38.18 kg/m., recommend weight loss, diet and exercise as appropriate   Family hx stroke (father)  Other Active Problems  Anemia - 9.7 / 30.3  Hypokalemia - 3.3 - supplement -> 3.7   Homocysteine, serum -  Cardiolipin Antibodies -  Lupus anticoagulant - not detected Beta-2-glycoprotein -  negative ESR - 49 (H) C reactive protein - 1.0 (H)  ANA Ab IFA -   HIV - non reactive RPR - non reactive ACE levels normal  Hospital day # 4   I have personally examined this patient, reviewed notes, independently viewed imaging studies, participated in medical decision making and plan of care.ROS completed by me personally and pertinent positives fully documented  I have made any additions or clarifications directly to the above note.  Patient has had multiple small subcortical infarcts in September 2016, December 2016 as well as present admission in the setting of uncontrolled risk factors of hypertension and diabetes and hyperlipidemia. She's had extensive evaluation, vasculitis and inflammatory conditions and sarcoidosis and spinal tap all of which have been a needing. Cerebral catheter angiogram shows isolated scattered changes of intracranial atherosclerosis which do not explain her infarcts. I think she likely has significant small vessel disease and mild intracranial atherosclerosis secondary to poorly controlled vascular risk factors. I recommend she stay on Aspirin 325 mg daily  For her superficial vein thrombosis in leg and make significant lifestyle changes and maintain aggressive risk factor modification. I had a long  discussion the patient and husband and daughter and answered questions. TCD bubble study done today shows only trivial right-to-left intracardiac shunt only during release phase of Valsalva which is clinically insignificant and unlikely to be large enough to explain paradoxical embolism. Greater than 50% time during this 35 minute visit was spent on counseling and coordination of care about stroke risk, prevention and treatment.Consider long term cardiac monitoring for PAF given some of her infarcts being cortical.  Antony Contras, [place] Pager: [phone] 12/30/2015 4:08 PM   To contact Stroke Continuity provider, please refer to [email or web address] After hours, contact General Neurology

## 2015-12-30 NOTE — Progress Notes (Signed)
Subjective: Patient complaining of chills, diffuse body aches, and generalized headache this morning. Very tearful but grateful for her care. Patient reported today that she was told by a provider many years ago that she had Rheumatoid arthritis. She has not followed up.   Objective:  Vital signs in last 24 hours: Vitals:   12/29/15 1107 12/29/15 1720 12/29/15 2151 12/30/15 0342  BP: (!) 141/66 (!) 158/68 (!) 129/52 (!) 147/60  Pulse: 94 87 85 89  Resp: (!) 23 18    Temp: 98.1 F (36.7 C) 98.2 F (36.8 C) 97.8 F (36.6 C) 97.9 F (36.6 C)  TempSrc: Oral Oral Oral Oral  SpO2: 100% 100% 99% 97%  Weight:      Height:       Physical Exam Constitutional: no distress, lying in bed, conversational, comfortable HEENT: Atraumatic, normocephalic Pulmonary: CTAB, normal effort Extremities: Warm and well perfused. Bilateral calf tenderness that has been present for some time Neurological: alert and oriented x3, CN II - XII grossly intact. Moving extremities spontaneously. Psychiatric: Normal mood and affect  Assessment/Plan:  Acute Encephalopathy: Patient presented with AMS, fevers with Tmax of 103, and hypertension (systolic to 607P). Metal status is improving but patient continues to complain of generalized HA and chills. MRI showed bilateral cortical and subcortical infarcts. LP was performed 10/14 after three days of broad spectrum antibiotics. Cell count and gram stain were largely normal. Protein and glucose were mildly elevated at 49 and 98 respectively. Cultures are still pending. Patient presented in June after a similar episode of AMS but was afebrile and CT and MRI were negative at that time. Differential remains broad, but concerning for CNS vasculitis vs. neurological manifestation of RA (given reported history) vs. Embolic disease. She was found to have a small distal DVT of the right lower extremity this admission. ECHO was normal. Also worth noting, patient's bHCG was mildly  elevated on admission and remained elevated on repeat testing. Patient is 20 and s/p hysterectomy. Further imaging of ovaries or pituitary may be warranted if other work up is non contributory. Teratoma or other malignancy is certainly on the differential. ANA pending, ESR 49, CRP 1. -- Neurology following, appreciate recommendations  -- Continue home depakote 500 mg IV daily  -- Neuro checks Q4hrs -- Extensive lab work up pending -- CSF labs & cultures pending; no growth in 2 days  -- Continue IV acyclovir pending HSV PCR result -- Vancomycin and ampicillin discontinued -- Continue IV ceftriaxone  -- TCD bubble studying to r/o PFO -- Cerebral angiogram today  -- Check RF and anti-CCP today   Right LE Distal DVT: asymptomatic and given her complexity of presentation, we have opted to observe off full dose anticoagulation.  She currently has heparin ordered for DVT prophylaxis.   -- continue observation with serial ultrasound every week for 2 weeks.  If there is proximal extension, then consider anticoagulation  DM2-- last Hgb A1C was 10.5 on 08/15/2015. On levemir 80 units QAM. Her glucose has been more elevated with giving her a diet.  Repeat A1C 13 this admission.  -- continue SSI  -- added Levemir 40units QAM  HTN: Home meds initially help due to AMS and NPO status. Mental status has cleared and patient is tolerating PO.  -- Contnue home HCTZ.  Resumed Lisinopril yesterday.  -- IV hydralazine PRN for elevated BP >180   HLD -- Crestor increased to '40mg'$  daily given LDL 153  Hx of Cerebrovascular Disease with Recurrent Strokes: multiple stroke risk  factors.  MRI on 10/13 with small bilateral acute and subacute cerebral infarcts. -- stroke work up as above -- will hold her anti-platelets for now.  Her outpatient neurologist recommended only continuing Plavix when seen in June 2017 -- Continue ASA 325 mg per neurology   Normocytic anemia: Hemoglobin may be decreased in setting of  acute illness.  Currently stable at 9.5.  No active bleeding. - continue to monitor  FEN: No fluids, replete lytes prn, Carb modified diet VTE ppx: Heparin  Code Status: FULL   Dispo: Anticipated discharge in approximately 3-4 day(s).   Velna Ochs, MD 12/30/2015, 8:48 AM Pager: [phone]

## 2015-12-31 DIAGNOSIS — G9340 Encephalopathy, unspecified: Secondary | ICD-10-CM

## 2015-12-31 LAB — MISC LABCORP TEST (SEND OUT): LABCORP TEST CODE: 216839

## 2015-12-31 LAB — CYCLIC CITRUL PEPTIDE ANTIBODY, IGG/IGA: CCP ANTIBODIES IGG/IGA: 4 U (ref 0–19)

## 2015-12-31 LAB — CBC
HEMATOCRIT: 27.7 % — AB (ref 36.0–46.0)
HEMOGLOBIN: 9 g/dL — AB (ref 12.0–15.0)
MCH: 28.3 pg (ref 26.0–34.0)
MCHC: 32.5 g/dL (ref 30.0–36.0)
MCV: 87.1 fL (ref 78.0–100.0)
Platelets: 165 10*3/uL (ref 150–400)
RBC: 3.18 MIL/uL — ABNORMAL LOW (ref 3.87–5.11)
RDW: 13.4 % (ref 11.5–15.5)
WBC: 3.5 10*3/uL — ABNORMAL LOW (ref 4.0–10.5)

## 2015-12-31 LAB — GLUCOSE, CAPILLARY
GLUCOSE-CAPILLARY: 163 mg/dL — AB (ref 65–99)
GLUCOSE-CAPILLARY: 233 mg/dL — AB (ref 65–99)
Glucose-Capillary: 278 mg/dL — ABNORMAL HIGH (ref 65–99)
Glucose-Capillary: 220 mg/dL — ABNORMAL HIGH (ref 65–99)

## 2015-12-31 LAB — BASIC METABOLIC PANEL
Anion gap: 10 (ref 5–15)
BUN: 11 mg/dL (ref 6–20)
CHLORIDE: 96 mmol/L — AB (ref 101–111)
CO2: 27 mmol/L (ref 22–32)
CREATININE: 1.15 mg/dL — AB (ref 0.44–1.00)
Calcium: 8.3 mg/dL — ABNORMAL LOW (ref 8.9–10.3)
GFR calc Af Amer: >60 mL/min (ref >60)
GFR calc non Af Amer: 54 mL/min — ABNORMAL LOW (ref >60)
GLUCOSE: 383 mg/dL — AB (ref 65–99)
Potassium: 3.7 mmol/L (ref 3.5–5.1)
Sodium: 133 mmol/L — ABNORMAL LOW (ref 135–145)

## 2015-12-31 LAB — CULTURE, BLOOD (ROUTINE X 2): CULTURE: NO GROWTH

## 2015-12-31 LAB — VARICELLA-ZOSTER BY PCR: VARICELLA-ZOSTER, PCR: NEGATIVE

## 2015-12-31 LAB — CMV DNA BY PCR, QUALITATIVE: CMV DNA, Qual PCR: NEGATIVE

## 2015-12-31 LAB — RHEUMATOID FACTOR: Rhuematoid fact SerPl-aCnc: <10 IU/mL (ref 0.0–13.9)

## 2015-12-31 LAB — VARICELLA ZOSTER ANTIBODY, IGM

## 2015-12-31 MED ORDER — DIVALPROEX SODIUM 500 MG PO DR TAB: 500.0000 mg | DELAYED_RELEASE_TABLET | Freq: Every day | ORAL | Status: DC

## 2015-12-31 MED ORDER — DEXTROSE 5 % IV SOLN
2.0000 g | INTRAVENOUS | Status: DC
  Administered 2015-12-31: 2 g via INTRAVENOUS
  Filled 2015-12-31 (×4): qty 2

## 2015-12-31 MED ORDER — SODIUM CHLORIDE 0.9 % IV SOLN
INTRAVENOUS | Status: DC
  Administered 2015-12-31 (×2): via INTRAVENOUS

## 2015-12-31 MED ORDER — GUAIFENESIN ER 600 MG PO TB12
600.0000 mg | ORAL_TABLET | Freq: Two times a day (BID) | ORAL | Status: DC
  Administered 2015-12-31 – 2016-01-01 (×3): 600 mg via ORAL
  Filled 2015-12-31 (×3): qty 1

## 2015-12-31 MED ORDER — DIVALPROEX SODIUM 500 MG PO DR TAB
500.0000 mg | DELAYED_RELEASE_TABLET | Freq: Two times a day (BID) | ORAL | Status: DC
  Administered 2015-12-31 – 2016-01-01 (×2): 500 mg via ORAL
  Filled 2015-12-31 (×2): qty 1

## 2015-12-31 NOTE — Progress Notes (Signed)
Pharmacy Antibiotic Note  Kelly Romero is a 50 y.o. 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 Pharmacy continues to dose Ceftriaxone. BCx from 10/12 show a contaminant. Antibiotic LOT was discussed with IMTS today - concern is no longer for meningitis but wants to keep empiric coverage for 1 more day in the setting of fevers. Due to this, will lower the Rocephin dose from twice daily to once daily.   Plan: - Adjust Rocephin to 2g IV every 24 hours - Will continue to follow renal function, culture results, LOT, and antibiotic de-escalation plans   Height: 5\' 4"  (162.6 cm) Weight: 222 lb 7.1 oz (100.9 kg) IBW/kg (Calculated) : 54.7  Temp (24hrs), Avg:98.9 F (37.2 C), Min:98.1 F (36.7 C), Max:99.9 F (37.7 C)   Recent Labs Lab 12/26/15 0750 12/27/15 0633 12/27/15 1624 12/28/15 0540 12/28/15 0714 12/29/15 0528 12/30/15 0219 12/31/15 0636  WBC  --  5.9  --  4.3  --  3.2* 3.4* 3.5*  CREATININE  --  0.89 0.91  --   --  0.98 1.03* 1.15*  LATICACIDVEN 1.34  --   --   --   --   --   --   --   VANCOTROUGH  --   --   --   --  22*  --   --   --     Estimated Creatinine Clearance: 66.1 mL/min (by C-G formula based on SCr of 1.15 mg/dL (H)).    Allergies  Allergen Reactions  . Erythromycin Itching    Antimicrobials this admission: Vanc 10/12>>10/16 Amp 10/12>>10/16 CTX 10/12>> Acyclovir 10/13>> 10/17 Zosyn x 1 10/12 >> 10/12  Dose adjustments this admission:  10/14: VT 22, drawn appropriately - reduce to 750 mg q12h  Microbiology results:  10/13 HSV: neg 10/12 blood: diptheroids (contaminant) 10/12 urine: 50k yeast MRSA PCR- neg 10/14 LP: glucose 98, protein 49, WBC wnl, gram stain negative 10/15 CSF Cx: ngtd  Georgina PillionElizabeth Shelaine Frie, PharmD, BCPS Clinical Pharmacist Pager: [phone] 12/31/2015 10:35 AM

## 2015-12-31 NOTE — Progress Notes (Signed)
STROKE TEAM PROGRESS NOTE   SUBJECTIVE (INTERVAL HISTORY) Patient sitting up in bedside chair. I discussed angio resluts with them and stressed need for aggressive risk factor modification and life style changes to reduce stroke risk.   OBJECTIVE Temp:  [98.7 F (37.1 C)-99.9 F (37.7 C)] 98.7 F (37.1 C) (10/17 0624) Pulse Rate:  [94-100] 94 (10/17 0624) Cardiac Rhythm: Normal sinus rhythm (10/17 0924) Resp:  [16] 16 (10/17 0624) BP: (140-145)/(44-53) 145/44 (10/17 0624) SpO2:  [97 %-98 %] 98 % (10/17 0624)  CBC:  Recent Labs Lab 12/26/15 0430  12/30/15 0219 12/31/15 0636  WBC 8.0  < > 3.4* 3.5*  NEUTROABS 6.3  --   --   --   HGB 12.5  < > 9.3* 9.0*  HCT 37.1  < > 29.3* 27.7*  MCV 82.6  < > 86.4 87.1  PLT 187  < > 154 165  < > = values in this interval not displayed.  Basic Metabolic Panel:   Recent Labs Lab 12/30/15 0219 12/31/15 0636  NA 138 133*  K 3.5 3.7  CL 102 96*  CO2 29 27  GLUCOSE 210* 383*  BUN 13 11  CREATININE 1.03* 1.15*  CALCIUM 8.8* 8.3*    Lipid Panel:     Component Value Date/Time   CHOL 212 (H) 12/28/2015 0714   TRIG 158 (H) 12/28/2015 0714   HDL 27 (L) 12/28/2015 0714   CHOLHDL 7.9 12/28/2015 0714   VLDL 32 12/28/2015 0714   LDLCALC 153 (H) 12/28/2015 0714   HgbA1c:  Lab Results  Component Value Date   HGBA1C 13.0 (H) 12/28/2015   Urine Drug Screen:     Component Value Date/Time   LABOPIA NONE DETECTED 12/26/2015 0650   COCAINSCRNUR NONE DETECTED 12/26/2015 0650   LABBENZ NONE DETECTED 12/26/2015 0650   AMPHETMU NONE DETECTED 12/26/2015 0650   THCU NONE DETECTED 12/26/2015 0650   LABBARB NONE DETECTED 12/26/2015 0650      IMAGING Mr Brain W Wo Contrast 12/27/2015 1. Small bilateral acute and subacute cerebral infarcts as described.  2. Motion degraded exam.   Ct Head Wo Contrast 12/26/2015  IMPRESSION: No acute intracranial abnormality. Remote small vessel ischemic infarcts are unchanged.   Dg Chest Portable 1  View 12/26/2015 IMPRESSION: Atelectasis right base. Question early pneumonia in this area. Lungs elsewhere clear. Cardiac silhouette within normal limits. Electronically Signed   By: Lowella Grip III M.D.   On: 12/26/2015 07:36   LE venous doppler  There is evidence of acute deep vein thrombosis involving a small segment of the right peroneal veins in the mid calf. There is no evidence of superficial vein thrombosis involving the right lower extremity. There is no evidence of deep or superficial vein thrombosis involving the left lower extremity.  There is no evidence of Baker's cysts bilaterally.  LP  Component     Latest Ref Rng & Units 12/28/2015  Tube #      3  Color, CSF     COLORLESS COLORLESS  Appearance, CSF     CLEAR CLEAR (A)  Supernatant      NOT INDICATED  RBC Count, CSF     0 /cu mm 8 (H)  WBC, CSF     0 - 5 /cu mm 1  Other Cells, CSF      TOO FEW TO COUNT, SMEAR AVAILABLE FOR REVIEW  Specimen Description      CSF  Special Requests      NONE  Gram Stain  CYTOSPIN SLIDE . Marland Kitchen Marland Kitchen  Report Status      12/28/2015 FINAL  Glucose, CSF     40 - 70 mg/dL 98 (H)  Total  Protein, CSF     15 - 45 mg/dL 49 (H)    Cerebral angio -  1.Severe stenosis of prox PCA P1 seg. 2.Approx 65 top 70 % stenosis of LT MCA sup division 3.Tapered stenosis of distal Rt pericallosal  In the region of splenium of CC  TCD bubble study -  Weakly positive for trivial clinically not significant right to left shunt with valsalva only.  TTE  - Left ventricle: The cavity size was normal. Wall thickness was increased in a pattern of mild LVH. Systolic function was normal. The estimated ejection fraction was in the range of 55% to 60%. Images were inadequate for LV wall motion assessment. Diastolic dysfunction, grade indeterminate. - Aortic valve: Mildly calcified annulus. Trileaflet. - Mitral valve: Mildly calcified annulus. There was mild regurgitation.   PHYSICAL EXAM General - Well  nourished, well developed, not in acute distress.  Ophthalmologic - Fundi not visualized due to eye movement.  Cardiovascular - Regular rate and rhythm.  Mental Status -  Level of arousal and orientation to time, place, and person were intact, but slow on thought processing. Language including expression, naming, repetition, comprehension was assessed and found intact, but bradyphasia, and mild psychomotor slowing. Attention span and concentration were normal for backward spelling, but no correct on calculation, slow in performing tasks. Recent and remote memory were impaired with 3/3 registration and 1/3 delayed recall Fund of Knowledge was assessed and was intact.  Cranial Nerves II - XII - II - Visual field intact OU. III, IV, VI - Extraocular movements intact. V - Facial sensation intact bilaterally. VII - Facial movement intact bilaterally. VIII - Hearing & vestibular intact bilaterally. X - Palate elevates symmetrically. XI - Chin turning & shoulder shrug intact bilaterally. XII - Tongue protrusion intact.  Motor Strength - [name] strength was normal in all extremities and pronator drift was absent.  Bulk was normal and fasciculations were absent.   Motor Tone - Muscle tone was assessed at the neck and appendages and was normal.  Reflexes - [name] reflexes were 1+ in all extremities and she had no pathological reflexes.  Sensory - Light touch, temperature/pinprick were assessed and were symmetrical.    Coordination - The patient had normal movements in the hands and feet with no ataxia or dysmetria.  Tremor was absent.  Gait and Station - deferred.    ASSESSMENT/PLAN Ms. Kelly Romero is a 50 y.o. female with history of hypertension, diabetes mellitus, previous stroke / TIA, depression, and hyperlipidemia presenting with altered mental status, fever, achiness, agitation, and speech difficulties. She did not receive IV t-PA due to unknown time of  onset.  Strokes:  Bilateral infarcts cortical and subcortical infarcts, etiology unclear likeley small vessel disease and mild intracranial atherosclerosis with uncontrolled multiple risk factors..    Resultant  HA, cognitive impairment  MRI - Small bilateral acute and subacute cerebral infarcts  Lumbar puncture - 12/28/2015 - glucose 98 (H) ; Protein 49 (H) ; 1 WBC, 8 RBC; No organisms. Other CSF labs  pending  LE Dopplers - right distal DVT, asymptomatic  TCD bubble study -  Positive for trivial right to left shunt during valsalva release only  Hypercoagulable panel - see below Cerebral angio - 1.Severe stenosis of prox PCA P1 seg. 2.Approx 65 top 70 % stenosis  of LT MCA sup division  3.Tapered stenosis of distal Rt pericallosal  In the region of splenium of CC   2D Echo -EF 55-60%. No source of embolus. LA AP 37   LDL - 153  HgbA1c 13.0  VTE prophylaxis - heparin subq Diet Heart Room service appropriate? Yes; Fluid consistency: Thin  clopidogrel 75 mg daily prior to admission, now on ASA 357m for stroke prevention.  Patient counseled to be compliant with her antithrombotic medications  Ongoing aggressive stroke risk factor management  Therapy recommendations: pending  Disposition:  home  Follow up with Dr. JTomi Likensafter discharge.  Right LE distal DVT, asymptomatic   Due to complexity of pt clinic picture and work up, may consider to continue observation with serial ultrasound (every week for 2 weeks). If DVT has proximal extension, then consider anticoagulation.   If need anticoagulation, recommend to consider heparin drip first due to cerebral angio on Monday.   History of stroke  11/2014 - dysarthria and right arm numbness - TIA - but elevated BP and glucose with UTI  02/2015 - HA and dysarthria - MRI showed small left occipital infarct - autoimmune and lupus work up negative  04/2015 - MRI right frontal infarct - TEE negative, no PFO  08/2015 -  encephalopathy, HA and ? Seizure - EEG neg, MRI ? Right frontal infarct - A1C 10.5, TTE neg, LDL 231 - put on depakote  08/2015 - follow up with Dr. JTomi Likens- on ASA and depakote and amitriptyline  09/2015 - ED visit for left facial nubmness - glucose > 500, considered DKA  ? Endocarditis  Fever PTA  Afebrile after admission  Blood culture - 1 out of 2 showed G+ rod, however, negative for BCID, likely contamination  Less likely endocarditis - agree to hold off TEE   ? Meningitis   Fever at home with neuro change and stroke  Blood culture - 1 out of 2 showed G+ rod, however, negative for BCID, likely contamination  LP unremarkable, but pt has been on Abx for 3 days   LP pattern not consistent with partial treated meningitis, but agree with one more day of Abx due to suspicious positive blood cultures  Consider d/c acyclovir once HSV PCR result available.  Migraine   On amitriptyline and depakote at home  Followed with Dr. JTomi Likensin clinic  On IV depakote now  Hypertension  Blood pressure somewhat elevated at times.  Long-term BP goal normotensive  Hyperlipidemia  Home meds:  Crestor 20 mg daily resumed in hospital  LDL 153, goal < 70  Increase dose to 40 mg daily.  Continue statin at discharge  Diabetes  HgbA1c 13.0, goal < 7.0  Uncontrolled  SSI  Other Stroke Risk Factors  Obesity, Body mass index is 38.18 kg/m., recommend weight loss, diet and exercise as appropriate   Family hx stroke (father)  Other Active Problems  Anemia - 9.7 / 30.3  Hypokalemia - 3.3 - supplement -> 3.7   Homocysteine, serum -  Cardiolipin Antibodies -  Lupus anticoagulant - not detected Beta-2-glycoprotein -  negative ESR - 49 (H) C reactive protein - 1.0 (H)  ANA Ab IFA -   HIV - non reactive RPR - non reactive ACE levels normal  Hospital day # 5   I have personally examined this patient, reviewed notes, independently viewed imaging studies, participated in  medical decision making and plan of care.ROS completed by me personally and pertinent positives fully documented  I have made any  additions or clarifications directly to the above note.  Patient has had multiple small subcortical infarcts in September 2016, December 2016 as well as present admission in the setting of uncontrolled risk factors of hypertension and diabetes and hyperlipidemia. She's had extensive evaluation, vasculitis and inflammatory conditions and sarcoidosis and spinal tap all of which have been a needing. Cerebral catheter angiogram shows isolated scattered changes of intracranial atherosclerosis which do not explain her infarcts. I think she likely has significant small vessel disease and mild intracranial atherosclerosis secondary to poorly controlled vascular risk factors. I recommend she stay on Aspirin 325 mg daily  for her superficial vein thrombosis in leg and make significant lifestyle changes and maintain aggressive risk factor modification. I had a long discussion the patient and husband and daughter and answered questions. TCD bubble study done today shows only trivial right-to-left intracardiac shunt only during release phase of Valsalva which is clinically insignificant and unlikely to be large enough to explain paradoxical embolism. Greater than 50% time during this 25 minute visit was spent on counseling and coordination of care about stroke risk, prevention and treatment.Consider long term cardiac monitoring for PAF given some of her infarcts being cortical. Stroke team will sign off. Kindly call for questions. Follow-up in an outpatient with her neurologist Dr. Gale Journey, MD Medical Director [place] Pager: [phone] 12/31/2015 2:36 PM   To contact Stroke Continuity provider, please refer to [email or web address] After hours, contact General Neurology

## 2015-12-31 NOTE — Progress Notes (Signed)
Subjective: Patient continues to improve and is feeling better today. Headache has resolved and she has been out of bed walking around her room. Inquiring about her discharge.  Objective:  Vital signs in last 24 hours: Vitals:   12/30/15 2028 12/31/15 0624 12/31/15 1438 12/31/15 2028  BP: (!) 140/53 (!) 145/44 (!) 141/67 (!) 155/74  Pulse: 100 94 87 90  Resp:  16 18 18   Temp: 99.9 F (37.7 C) 98.7 F (37.1 C) 98.3 F (36.8 C) 98.2 F (36.8 C)  TempSrc: Oral Oral Oral Oral  SpO2: 97% 98% 100% 96%  Weight:      Height:       Physical Exam Constitutional: NAD, appears comfortable Cardiovascular: RRR, no murmurs, rubs, or gallops.  Pulmonary/Chest: CTAB, no wheezes, rales, or rhonchi. Abdominal: Soft, non tender, non distended. +BS.  Extremities: Warm and well perfused. Distal pulses intact.  Neurological: A&Ox3, CN II - XII grossly intact.   Assessment/Plan:  Acute Encephalopathy: Patient presented with AMS, fevers with Tmax of 103, and hypertension (systolic to 220s). MRI showed bilateral cortical and subcortical infarcts. LP was performed 10/14 after three days of broad spectrum antibiotics consistent with an aseptic picture, cultures are negative growth to date. Patient presented in June after a similar episode of AMS but was afebrile and CT and MRI were negative at that time. She was found to have a small distal DVT of the right lower extremity this admission and there was concern for paradoxical emboli. ECHO was normal and TCD bubble study was negative for PFO. Also worth noting, patient's bHCG was mildly elevated on admission and remained elevated on repeat testing. Patient is [age] and s/p hysterectomy. Further imaging of ovaries or pituitary may be warranted if other work up is non contributory. Teratoma or other malignancy is certainly on the differential. Cerebral angiogram yesterday showed multivessel atherosclerosis. Etiology at this time is felt to be CVA due to her multiple  risk factors. However, this does not explain her fevers. It is possible that patient had a superimposed viral meningitis despite negative LP. Patient improved on IV antibiotics and IV acyclovir and mental status is now back to baseline. -- Neurology following, appreciate recommendations  -- Depakote 500 mg IV changed to PO BID -- CSF labs & cultures pending: NGTD  -- IV acyclovir discontinued, HSV PCR negative -- Continue IV ceftriaxone for today, plan to changed to PO abx tomorrow -- PT/OT eval   Right LE Distal DVT: asymptomatic and given her complexity of presentation, we have opted to observe off full dose anticoagulation. She currently has heparin ordered for DVT prophylaxis.  -- continue observation with serial ultrasound every week for 2 weeks. If there is proximal extension, then consider anticoagulation  DM2-- last Hgb A1C was 10.5 on 08/15/2015. On levemir 80 units QAM. Her glucose has been more elevated with giving her a diet. Repeat A1C 13 this admission.  -- continueSSI  -- added Levemir 40units QAM  HTN: Home meds initially help due to AMS and NPO status. Mental status has cleared and patient is tolerating PO.  -- Contnue home HCTZ and Lisinopril .  -- IV hydralazine PRN discontinued   HLD -- Continue Crestor 40mg  daily  Hx of Cerebrovascular Disease with Recurrent Strokes: multiple stroke risk factors. MRI on 10/13 with small bilateral acute and subacute cerebral infarcts. -- stroke work up as above -- will hold her anti-platelets for now. Her outpatient neurologist recommended only continuing Plavix when seen in June 2017 -- Continue ASA  325 mg per neurology   Normocytic anemia:Hemoglobin may be decreased in setting of acute illness. Currently stable at 9.5. No active bleeding. - continue to monitor  FEN: No fluids, replete lytes prn, Carb modified diet VTE ppx: Heparin  Code Status: FULL   Dispo: Anticipated discharge in approximately 1-2 day(s).    Reymundo Poll, MD 12/31/2015, 8:33 PM Pager: [phone]

## 2015-12-31 NOTE — Plan of Care (Signed)
Problem: Tissue Perfusion: Goal: Cerebral tissue perfusion will improve (applicable to all stroke diagnoses) Outcome: Progressing Denies S/S of DVT  Problem: Bowel/Gastric: Goal: Will not experience complications related to bowel motility Outcome: Progressing No bowel or gastric issues noted

## 2015-12-31 NOTE — Evaluation (Signed)
Physical Therapy Evaluation Patient Details Name: Kelly Romero MRN: [id number] DOB: [birth year] Today's Date: 12/31/2015   History of Present Illness  50 yo female admitted with acute encephalopathy of unclear etiology, acute DVT R LE.  MRI (+) small bilateral acute/subacute cerebral infarcts 10/13. LP 10/14. Cerebral angiogram 10/16  Clinical Impression  On eval, pt was Min guard assist for mobility. She walked ~150 feet in hallway. She was mildly unsteady at times but no overt LOB. Pt c/o bil LEs feeling weak and painful at times. Pt tolerated activity well. Pt states she hopes to return home tomorrow and she hopes to get back to work soon. Pt politely declines HHPT follow-feels she and husband can manage at home. Recommend daily ambulation with nursing or family supervision.  Encouraged pt to use RW at home if/when she needs it.     Follow Up Recommendations No PT follow up;Supervision/Assistance - 24 hour (pt declined HHPT follow up)    Equipment Recommendations  None recommended by PT    Recommendations for Other Services       Precautions / Restrictions Precautions Precautions: Fall Restrictions Weight Bearing Restrictions: No      Mobility  Bed Mobility Overal bed mobility: Modified Independent                Transfers Overall transfer level: Modified independent                  Ambulation/Gait Ambulation/Gait assistance: Min guard Ambulation Distance (Feet): 150 Feet Assistive device: None Gait Pattern/deviations: Step-through pattern;Decreased stride length     General Gait Details: close guard for safety. Pt intermittently held on to hallway handrail. slow gait speed. Unsteady at times but no overt LOB  Information systems manager [employer] (Stroke Patients Only)       Balance Overall balance assessment: Needs assistance           Standing balance-Leahy Scale: Good                                Pertinent Vitals/Pain Pain Assessment: No/denies pain    Home Living Family/patient expects to be discharged to:: Private residence Living Arrangements: Spouse/significant other Available Help at Discharge: Family;Available 24 hours/day Type of Home: Mobile home Home Access: Stairs to enter Entrance Stairs-Rails: Right;Left;Can reach both Entrance Stairs-Number of Steps: 4 Home Layout: One level Home Equipment: Walker - 2 wheels (belonged to her deceased mother)      Prior Function Level of Independence: Independent         Comments: Works as a Runner, 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     Hand Dominance        Extremity/Trunk Assessment   Upper Extremity Assessment: Overall WFL for tasks assessed           Lower Extremity Assessment: Generalized weakness      Cervical / Trunk Assessment: Normal  Communication   Communication: No difficulties  Cognition Arousal/Alertness: Awake/alert Behavior During Therapy: WFL for tasks assessed/performed Overall Cognitive Status: Within Functional Limits for tasks assessed       Memory: Decreased short-term memory              General Comments      Exercises     Assessment/Plan    PT Assessment Patient needs continued PT services  PT Problem List Decreased  mobility;Decreased activity tolerance;Decreased balance          PT Treatment Interventions Gait training;Therapeutic activities;Therapeutic exercise;Balance training;Functional mobility training    PT Goals (Current goals can be found in the Care Plan section)  Acute Rehab PT Goals Patient Stated Goal: home soon. return to work PT Goal Formulation: With patient Time For Goal Achievement: 01/14/16    Frequency Min 3X/week   Barriers to discharge        Co-evaluation               End of Session Equipment Utilized During Treatment: Gait belt Activity Tolerance: Patient tolerated treatment well Patient left: in  chair;with call bell/phone within reach           Time: [id number] PT Time Calculation (min) (ACUTE ONLY): 26 min   Charges:   PT Evaluation $PT Eval Low Complexity: 1 Procedure PT Treatments $Gait Training: 8-22 mins   PT G Codes:        Rebeca AlertJannie Normal Recinos, MPT Pager: [phone]

## 2015-12-31 NOTE — Progress Notes (Signed)
Chaplain presented to the patient for spiritual care support, and to complete a request to provide a Advance Directive for the patient's completion. Chaplain will follow up at a later time for assistance if needed prior to notarized copy being placed in patient's medical record. Chaplain Janell Quiet [phone]

## 2015-12-31 NOTE — Progress Notes (Signed)
Inpatient Diabetes Program Recommendations  AACE/ADA: New Consensus Statement on Inpatient Glycemic Control (2015)  Target Ranges:  Prepandial:   less than 140 mg/dL      Peak postprandial:   less than 180 mg/dL (1-2 hours)      Critically ill patients:  140 - 180 mg/dL   Lab Results  Component Value Date   GLUCAP 220 (H) 12/31/2015   HGBA1C 13.0 (H) 12/28/2015   Results for [name], Kelly Romero (MRN [id number]) as of 12/31/2015 14:54  Ref. Range 12/30/2015 16:40 12/30/2015 20:32 12/31/2015 06:48 12/31/2015 12:14  Glucose-Capillary Latest Ref Range: 65 - 99 mg/dL 657272 (H) 846308 (H) 962278 (H) 220 (H)   Review of Glycemic Control  Needs increase in basal insulin. Post-prandial blood sugars elevated - may benefit from meal coverage insulin.   Inpatient Diabetes Program Recommendations:    Increase Levemir to 50 units QAM. Add Novolog 4 units tidwc.  HgbA1C pending.  Thank you. Ailene Ardshonda Tomie Elko, RD, LDN, CDE Inpatient Diabetes Coordinator [phone]

## 2016-01-01 ENCOUNTER — Encounter (HOSPITAL_COMMUNITY): Payer: Self-pay | Admitting: Interventional Radiology

## 2016-01-01 LAB — GLUCOSE, CAPILLARY
Glucose-Capillary: 150 mg/dL — ABNORMAL HIGH (ref 65–99)
Glucose-Capillary: 185 mg/dL — ABNORMAL HIGH (ref 65–99)

## 2016-01-01 LAB — CBC
HEMATOCRIT: 28.1 % — AB (ref 36.0–46.0)
Hemoglobin: 8.9 g/dL — ABNORMAL LOW (ref 12.0–15.0)
MCH: 28 pg (ref 26.0–34.0)
MCHC: 31.7 g/dL (ref 30.0–36.0)
MCV: 88.4 fL (ref 78.0–100.0)
PLATELETS: 182 10*3/uL (ref 150–400)
RBC: 3.18 MIL/uL — ABNORMAL LOW (ref 3.87–5.11)
RDW: 13.2 % (ref 11.5–15.5)
WBC: 4.2 10*3/uL (ref 4.0–10.5)

## 2016-01-01 LAB — BASIC METABOLIC PANEL
Anion gap: 7 (ref 5–15)
BUN: 7 mg/dL (ref 6–20)
CHLORIDE: 101 mmol/L (ref 101–111)
CO2: 32 mmol/L (ref 22–32)
CREATININE: 0.92 mg/dL (ref 0.44–1.00)
Calcium: 8.9 mg/dL (ref 8.9–10.3)
GFR calc Af Amer: >60 mL/min (ref >60)
GLUCOSE: 155 mg/dL — AB (ref 65–99)
Potassium: 3.9 mmol/L (ref 3.5–5.1)
SODIUM: 140 mmol/L (ref 135–145)

## 2016-01-01 LAB — OLIGOCLONAL BANDS, CSF + SERM

## 2016-01-01 LAB — CMV DNA BY PCR, QUALITATIVE: CMV DNA, Qual PCR: NEGATIVE

## 2016-01-01 MED ORDER — FLUCONAZOLE 150 MG PO TABS
150.0000 mg | ORAL_TABLET | Freq: Once | ORAL | Status: AC
  Administered 2016-01-01: 150 mg via ORAL
  Filled 2016-01-01: qty 1

## 2016-01-01 MED ORDER — ROSUVASTATIN CALCIUM 40 MG PO TABS
40.0000 mg | ORAL_TABLET | Freq: Every day | ORAL | 3 refills | Status: DC
Start: 2016-01-01 — End: 2017-08-06

## 2016-01-01 MED ORDER — CEFIXIME 400 MG PO CAPS: 400.0000 mg | ORAL_CAPSULE | Freq: Every day | ORAL | 0 refills | Status: DC

## 2016-01-01 MED ORDER — FLUCONAZOLE 150 MG PO TABS: 150.0000 mg | ORAL_TABLET | Freq: Every day | ORAL | Status: DC

## 2016-01-01 MED ORDER — ESCITALOPRAM OXALATE 10 MG PO TABS: 10.0000 mg | ORAL_TABLET | Freq: Every day | ORAL | 3 refills | Status: DC

## 2016-01-01 MED ORDER — DIVALPROEX SODIUM 500 MG PO DR TAB: 500.0000 mg | DELAYED_RELEASE_TABLET | Freq: Two times a day (BID) | ORAL | 3 refills | Status: DC

## 2016-01-01 MED ORDER — LISINOPRIL 30 MG PO TABS: 30.0000 mg | ORAL_TABLET | Freq: Every day | ORAL | 3 refills | Status: DC

## 2016-01-01 MED ORDER — FLUCONAZOLE 150 MG PO TABS: 150.0000 mg | ORAL_TABLET | Freq: Once | ORAL | 0 refills | Status: AC

## 2016-01-01 MED ORDER — CEFIXIME 400 MG PO CAPS
400.0000 mg | ORAL_CAPSULE | Freq: Every day | ORAL | Status: DC
  Administered 2016-01-01: 400 mg via ORAL
  Filled 2016-01-01: qty 1

## 2016-01-01 MED ORDER — CEFDINIR 300 MG PO CAPS: 300.0000 mg | ORAL_CAPSULE | Freq: Two times a day (BID) | ORAL | 0 refills | Status: DC

## 2016-01-01 MED ORDER — ASPIRIN 325 MG PO TBEC: 325.0000 mg | DELAYED_RELEASE_TABLET | Freq: Every day | ORAL | 1 refills | Status: DC

## 2016-01-01 MED ORDER — AMITRIPTYLINE HCL 25 MG PO TABS: 25.0000 mg | ORAL_TABLET | Freq: Every day | ORAL | 3 refills | Status: DC

## 2016-01-01 NOTE — Evaluation (Signed)
Occupational Therapy Evaluation/Discharge Patient Details Name: Kelly Romero MRN: [id number] DOB: [birth year] Today's Date: 01/01/2016    History of Present Illness 50 yo female admitted with acute encephalopathy of unclear etiology, acute DVT R LE.  MRI (+) small bilateral acute/subacute cerebral infarcts 10/13. LP 10/14. 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 Pt currently requires min guard assist during dynamic standing and functional mobility and supervision for other ADL. Upper body coordination in-tact at this time. Noted decreased short term memory but no other cognitive limitations impacted session and ability to participate in ADL.  Pt plans to D/C home with husband who is able to provide 24 hour supervision/assistance. Completed education concerning energy conservation and fall prevention in the home. Pt does not have any further acute OT needs. OT will sign off.    Follow Up Recommendations  No OT follow up;Supervision/Assistance - 24 hour    Equipment Recommendations  None recommended by OT       Precautions / Restrictions Precautions Precautions: Fall Restrictions Weight Bearing Restrictions: No      Mobility Bed Mobility Overal bed mobility: Modified Independent                Transfers Overall transfer level: Needs assistance Equipment used: None Transfers: Sit to/from Stand Sit to Stand: Supervision         General transfer comment: Supervision for sit<>stand. Min guard for more complicated shower transfer while weight shifting.    Balance Overall balance assessment: Needs assistance Sitting-balance support: No upper extremity supported;Feet supported Sitting balance-Leahy Scale: Good     Standing balance support: No upper extremity supported;During functional activity Standing balance-Leahy Scale: Fair Standing balance comment:  Pt requiring min guard assist for loss of balance x1 during dynamic standing activity at sink.                             ADL Overall ADL's : Needs assistance/impaired Eating/Feeding: Set up;Sitting   Grooming: Min guard;Standing   Upper Body Bathing: Set up;Sitting   Lower Body Bathing: Supervison/ safety;Sit to/from stand   Upper Body Dressing : Set up;Sitting   Lower Body Dressing: Supervision/safety;Sit to/from stand   Toilet Transfer: Ambulation;Supervision/safety   Toileting- Architect and Hygiene: Supervision/safety;Sit to/from stand   Tub/ Shower Transfer: Ambulation;Min guard   Functional mobility during ADLs: Cueing for sequencing;Min guard General ADL Comments: Pt with loss of balance x1 while standing at sink to brush teeth requiring min guard assist for safety. Pt and husband educated on fall prevention, energy conservation, and safety post-D/C.               Pertinent Vitals/Pain Pain Assessment: No/denies pain     Hand Dominance Right   Extremity/Trunk Assessment Upper Extremity Assessment Upper Extremity Assessment: Overall WFL for tasks assessed   Lower Extremity Assessment Lower Extremity Assessment: Generalized weakness   Cervical / Trunk Assessment Cervical / Trunk Assessment: Normal   Communication Communication Communication: No difficulties   Cognition Arousal/Alertness: Awake/alert Behavior During Therapy: WFL for tasks assessed/performed Overall Cognitive Status: Within Functional Limits for tasks assessed       Memory: Decreased short-term memory                        Home Living Family/patient expects to be discharged to:: Private residence Living Arrangements: Spouse/significant  other Available Help at Discharge: Family;Available 24 hours/day Type of Home: Mobile home Home Access: Stairs to enter Entrance Stairs-Number of Steps: 4 Entrance Stairs-Rails: Right;Left;Can reach both Home  Layout: One level     Bathroom Shower/Tub: Walk-in shower;Door   Foot LockerBathroom Toilet: Standard     Home Equipment: Environmental consultantWalker - 2 wheels;Cane - single point   Additional Comments: Pt requiring reminders from husband to provide accurate information.      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        OT Problem List: Decreased activity tolerance;Impaired balance (sitting and/or standing);Decreased safety awareness;Decreased cognition   OT Treatment/Interventions:      OT Goals(Current goals can be found in the care plan section) Acute Rehab OT Goals Patient Stated Goal: to go back to work OT Goal Formulation: With patient/family Time For Goal Achievement: 01/15/16 Potential to Achieve Goals: Good  OT Frequency:      End of Session Equipment Utilized During Treatment: Gait belt  Activity Tolerance: Patient tolerated treatment well Patient left: in chair;with call bell/phone within reach;with family/visitor present   Time: [id number] OT Time Calculation (min): 33 min Charges:  OT General Charges $OT Visit: 1 Procedure OT Evaluation $OT Eval Moderate Complexity: 1 Procedure OT Treatments $Self Care/Home Management : 8-22 mins  Doristine SectionCharity A Sarthak Rubenstein, OTR/L [phone] 01/01/2016, 3:10 PM

## 2016-01-01 NOTE — Progress Notes (Signed)
Completed advanced directive. Chaplain available for follow-up.

## 2016-01-01 NOTE — Plan of Care (Signed)
Problem: Nutrition: Goal: Risk of aspiration will decrease Outcome: Progressing No S/S of aspiration noted  Problem: Tissue Perfusion: Goal: Complications of Ischemic Stroke will be minimized (choose ONE based on patient diagnosis) Outcome: Progressing No complications noted

## 2016-01-01 NOTE — Progress Notes (Signed)
Pt stable for d/c home this afternoon per MD. Peripheral IVs removed, tele monitor removed. Belongings gathered and will be sent with pt. Discharge instructions and prescriptions reviewed with pt, all questions answered. Assisted to car in wheelchair by NT.  [place], Latricia HeftKorie G

## 2016-01-01 NOTE — Progress Notes (Signed)
Visited with patient referred by nurse. Patient is to be discharged today. Provided spiritual support, explained advanced directive to patient and husband. Chaplain to follow-up to learn if patient wishes to execute document today before discharge.   01/01/16 1200  Clinical Encounter Type  Visited With Patient and family together  Visit Type Initial;Spiritual support;Other (Comment)  Referral From Nurse (Advanced directive)  Spiritual Encounters  Spiritual Needs Literature;Emotional;Other (Comment)  Stress Factors  Patient Stress Factors Health changes (Explanation of advanced directive to patient and family)  Family Stress Factors Family relationships

## 2016-01-01 NOTE — Discharge Summary (Signed)
Name: Kelly Romero MRN: [id number] DOB: 04-08-1965 50 y.o. PCP: Kelly Saupe, MD  Date of Admission: 12/26/2015  3:47 AM Date of Discharge: 01/01/2016 Attending Physician: Kelly Catalina, MD  Discharge Diagnosis: 1. Acute Encephalopathy  2. CVA  Discharge Medications:   Medication List    STOP taking these medications   baclofen 10 MG tablet Commonly known as:  LIORESAL   clopidogrel 75 MG tablet Commonly known as:  PLAVIX     TAKE these medications   amitriptyline 25 MG tablet Commonly known as:  ELAVIL Take 1 tablet (25 mg total) by mouth at bedtime. What changed:  Another medication with the same name was removed. Continue taking this medication, and follow the directions you see here.   aspirin 325 MG EC tablet Take 1 tablet (325 mg total) by mouth daily. Start taking on:  01/02/2016   cefdinir 300 MG capsule Commonly known as:  OMNICEF Take 1 capsule (300 mg total) by mouth 2 (two) times daily.   divalproex 500 MG DR tablet Commonly known as:  DEPAKOTE Take 1 tablet (500 mg total) by mouth 2 (two) times daily. What changed:  when to take this   escitalopram 10 MG tablet Commonly known as:  LEXAPRO Take 1 tablet (10 mg total) by mouth daily.   hydrochlorothiazide 25 MG tablet Commonly known as:  HYDRODIURIL Take 1 tablet (25 mg total) by mouth daily.   ibuprofen 800 MG tablet Commonly known as:  ADVIL,MOTRIN Take 1 tablet (800 mg total) by mouth every 8 (eight) hours as needed.   insulin detemir 100 UNIT/ML injection Commonly known as:  LEVEMIR Inject 80 Units into the skin every morning.   lisinopril 30 MG tablet Commonly known as:  PRINIVIL,ZESTRIL Take 1 tablet (30 mg total) by mouth daily.   rosuvastatin 40 MG tablet Commonly known as:  CRESTOR Take 1 tablet (40 mg total) by mouth at bedtime. What changed:  medication strength  how much to take       Disposition and follow-up:   Kelly Romero was discharged from  [facility] in Stable condition.  At the hospital follow up visit please address:  1.  DVT: Lower extremity ultrasound performed on 10/14 revealing a small distal DVT of the right lower extremity. Decision was made to monitor with serial ultrasound every two weeks rather than start anticoagulation due to complicated clinical course. Patient will need repeat ultrasound at one and two weeks after initial ultrasound. If there is proximal extension, consider anticoagulation.   2. bHCG: Mildly elevated this admission, remained elevated on repeat testing. Patient is 9 and s/p hysterectomy. Further imaging of ovaries or pituitary may be warranted, teratoma or other malignancy is certainly on the differential. Defered to PCP.  3. Diabetes: A1C 13 this admission. Suspect poor compliance with home insulin. Please follow up.   4.  Labs / imaging needed at time of follow-up: bHCG, CBC  5.  Pending labs/ test needing follow-up: None  Follow-up Appointments:   Hospital Course by problem list:  1. Acute Encephalopathy / CVA: Patient presented with AMS, persistent fevers with Tmax of 103, and hypertension (systolic to 220s). She was at her aunt's funeral when her husband noticed garbled speech. She went to bed that evening and woke up around 2:30am. Husband noted her speech was incoherent and called EMS. Per her family, she has had chills over the few days prior, but no fevers. She has otherwise been in her normal state  of health. She had a similar episode of AMS back in June of this year but was afebrile and CT and MRI were negative at that time. 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 24 hour ambulatory EEG was later negative. This admission, patient's family reported that she had been out of her amitriptyline and Depakote for the past 2-3 weeks. CT head was negative but MRI showed bilateral cortical and  subcortical infarcts (acute and subacute) concerning for CVA vs. Vasculitis vs. Embolic disease. Endocarditis seemed unlikely based on Duke's criteria and TTE was normal. She was placed on empiric treatment for meningitis given her high fevers and AMS and family initially declined LP on presentation. Family later reconsidered and agreed, unfortunately LP results were consistent with an aseptic picture. Patient had been on IV antibiotics and IV acyclovir for 3 days at that time. CSF cultures were negative for growth. Blood cultures grew Diphtheroids (Corynebacterium species) in 1 out of 2 cultures, likely a contaminant. But give her complicated course the decision was made to continue treatment. Sent home with a prescription of cefdinir to complete a 10 day course of antibitoics. Of note, she was found to have a small distal DVT of the right lower extremity this admission and there was concern for paradoxical emboli. ECHO was normal and TCD bubble study was negative for PFO. Anticoagulation was not started due to her complicated medical picture and the decision was made to monitor with serial ultrasound weekly for the next two weeks. 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 Results showed multivessel atherosclerosis (see result report below). Per neurology, etiology was felt to be ischemic CVA due to her multiple risk factors. She is obsese with HTN, HLD, and a poorly controlled diabetic (A1C this admission was 13). I suspect poor compliance with her home insulin. Lipid panel also showed elevated LDL and her statin was increased from 20 mg Crestor to 40 mg daily. Patient was counseled extensively on lifestyle and risk factor modifications to prevent recurrent strokes. Although her MRI and risk factors are consistent with CVA, this does not explain her high, persistent fevers and AMS. It is possible  that patient had a superimposed viral meningitis despite negative LP. Patient improved on IV antibiotics and IV acyclovir and mental status was back to baseline on discharge. She does not have any deficits from her stroke. Patient under went extensive lab work up for infectious and autoimmune etiologies, all of which resulted negative. Of note, patient was found to have a mildly elevated bHCG on admission that was checked in the ED. She is [age] and s/p hysterectomy. bHCG remained elevated on repeat testing the following day. Suspicion for ectopic pregnancy was low given her benign abdominal exam and patient was not complaining of pain. Further imaging of ovaries or pituitary may be warranted at this time. Teratoma or other malignancy is certainly on the differential. Further work up was deferred to her PCP, recommend repeat bHCG testing at follow up. Patient was counseled on medication compliance and encouraged not to stop taking her Depakote. She should be on this medicine twice daily (reported taking only once). **If patient presents a third time with a similar clinical picture, high dose steroids or possibly brain biopsy may be indicated. Instructed patient to return to ER if any symptoms recur.**   2. Right LE Distal  DVT: Asymptomatic and given her complexity of presentation, we opted to observe off full dose anticoagulation. She received heparin for further DVT prophylaxis. Recommend continued observation with serial ultrasound every week for the next two weeks. Initial ultrasound performed 10/14, recommend repeat sometime around 10/21 and 10/28. If there is proximal extension, consider anticoagulation.   2. Vaginal Candidiasis: Patient complaining of itching and vaginal discharge prior to going home. Urine cultures grew yeast. She was given one dose of diflucan 150 mg and sent home with a prescription for a second dose if needed.   3. HTN: Patient was made NPO initially due to her AMS and home meds were  held. Her BP was treated with IV hydralazine prn. Mental status improved on day 2-3 of hospitalization and home HCTZ and Lisinopril were restarted with good BP response. Down to 140s systolic on discharge.   4. HLD: Lipid panel this admission with LDL 153. Crestor increased to 40 mg daily.   5. Hx of Cerebrovascular Disease with Recurrent Strokes: Patient with multiple stroke risk factors.  MRI on 10/13 with small bilateral acute and subacute cerebral infarcts. Plavix was held on admission and she was started on ASA 325 daily per neurology consult recommendations. Her outpatient neurologist recommended only continuing Plavix when seen in June 2017. Instructed patient to hold plavix and take ASA 325 until she follows up with her neurologist.   6. Normocytic anemia: Hemoglobin decreased 9-10 from her baseline of 13 but stable during admission. Possibly due to acute illness. No active bleeding. May warrant further outpatient work up.   Discharge Vitals:   BP (!) 145/52 (BP Location: Right Arm)   Pulse 89   Temp 97.6 F (36.4 C) (Oral)   Resp 17   Ht 5\' 4"  (1.626 m)   Wt 222 lb 7.1 oz (100.9 kg)   SpO2 96%   BMI 38.18 kg/m   Pertinent Labs, Studies, and Procedures:   12/26/2015 CT Head Wo Contrast: IMPRESSION: No acute intracranial abnormality. Remote small vessel ischemic infarcts are unchanged.  12/27/2015 MRI Brain IMPRESSION: 1. Small bilateral acute and subacute cerebral infarcts as described. 2. Motion degraded exam.  12/28/2015 DG Fluoro Guided Lumbar Puncture  IMPRESSION: Fluoroscopic guided lumbar puncture without immediate complication.  12/28/2015 Lower Extremity Doppler Summary: Bilateral lower extremity venous duplex completed.  There is evidence of acute deep vein thrombosis involving a small segment of the right peroneal veins in the mid calf. There is no evidence of superficial vein thrombosis involving the right lower extremity. There is no evidence of deep  or superficial vein thrombosis involving the left lower extremity. There is no evidence of Baker&'s cysts bilaterally. Other specific details can be found in the table(s) above.  12/29/2015 TTE: Study Conclusions  - Left ventricle: The cavity size was normal. Wall thickness was   increased in a pattern of mild LVH. Systolic function was normal.   The estimated ejection fraction was in the range of 55% to 60%.   Images were inadequate for LV wall motion assessment. Diastolic   dysfunction, grade indeterminate. - Aortic valve: Mildly calcified annulus. Trileaflet. - Mitral valve: Mildly calcified annulus. There was mild   regurgitation.  12/30/2015 Cerebral Angiogram  IMPRESSION: Approximately 65-70% stenosis of the prominent origin of the superior division of the left middle cerebral artery with slow antegrade flow.  Severe stenosis of the right posterior cerebellar artery P1 segment.  Smooth tapered narrowing of the right pericallosal artery in its A3 A4 segment.  Focal areas of caliber  irregularity involving the midportion of the left posterior-inferior cerebellar artery, the left posterior cerebral artery P1 segment and the P3 regions.  Mild atherosclerotic narrowing of the internal carotid arteries bilaterally left greater than right as mentioned above.  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     Discharge Instructions: Discharge Instructions    Diet - low sodium heart healthy    Complete by:  As directed    Discharge instructions    Complete by:  As directed    Please continue taking your antibiotic, cefdinir, twice a day for the next 4 days. Stop taking your Plavix and start taking Aspirin 325 mg daily until you follow up with your neurologist. Your Crestor has  been increased to 40 mg daily. New prescriptions for all of these medicines have been sent to your pharmacy. You have been scheduled for a follow up appointment with your primary care doctor, Dr. Jeanie Sewer, this Friday at 4:10pm. It is very important that you keep this appointment. You will need close follow up of your diabetes and will likely need adjustments to your insulin dosing. You will also need to get another ultrasound of the clot in your leg on the 21st of this month (one week after your first ultrasound) and again on the 28th (two weeks after). Your primary care doctor may decide to start you on blood thinners if the clot is progressing. Please follow up with neurology for your scheduled appointment on 10/31. If your symptoms recur, please return to the ER for evaluation. It was a pleasure taking care of you. Thank you!   Increase activity slowly    Complete by:  As directed       Signed: Reymundo Poll, MD 01/01/2016, 3:33 PM   Pager: [phone]

## 2016-01-01 NOTE — Progress Notes (Signed)
Counseled patient on antibiotic (cefdinir) in regards to indication, how to properly take medication, and potential side effects.  Patient and husband were both present and vocalized understanding. Patient mentioned she was concerned about running out of her medications at home and requested refills. Contacted Dr. Antony ContrasGuilloud concerning the patient's request and she stated she would contact the patient.  Monika SalkAngela Yassir Enis, PharmD Candidate, [employer] [employer]

## 2016-01-01 NOTE — Progress Notes (Signed)
Subjective: Patient feels better today and is back to her baseline. She endorses a mild headache but otherwise is asymptomatic. Requesting to go home today.   Objective:  Vital signs in last 24 hours: Vitals:   12/31/15 0624 12/31/15 1438 12/31/15 2028 01/01/16 0550  BP: (!) 145/44 (!) 141/67 (!) 155/74 (!) 145/52  Pulse: 94 87 90 89  Resp: 16 18 18 17   Temp: 98.7 F (37.1 C) 98.3 F (36.8 C) 98.2 F (36.8 C) 97.6 F (36.4 C)  TempSrc: Oral Oral Oral Oral  SpO2: 98% 100% 96% 96%  Weight:      Height:       Physical Exam Constitutional: NAD, appears comfortable Cardiovascular: RRR, no murmurs, rubs, or gallops.  Pulmonary/Chest: CTAB, no wheezes, rales, or rhonchi. Abdominal: Soft, non tender, non distended. +BS.  Extremities: Warm and well perfused. Distal pulses intact.  Neurological: A&Ox3, CN II - XII grossly intact.   Assessment/Plan:  Acute Encephalopathy:Resolved. Patient presented with AMS, fevers with Tmax of 103, and hypertension (systolic to 220s). MRI showed bilateral cortical and subcortical infarcts. LP was performed 10/14 after three days of broad spectrum antibiotics consistent with an aseptic picture, cultures are negative growth to date. Patient presented in June after a similar episode of AMS but was afebrile and CT and MRI were negative at that time. She was found to have a small distal DVT of the right lower extremity this admission and there was concern for paradoxical emboli. ECHO was normal and TCD bubble study was negative for PFO. Cerebral angiogram 10/16 showed multivessel atherosclerosis. Etiology at this time is felt to be CVA due to her multiple risk factors. However, this does not explain her fevers. It is possible that patient had a superimposed viral meningitis despite negative LP. Patient improved on IV antibiotics and IV acyclovir and mental status is now back to baseline.  -- Continue depakote 500 mg PO BID -- CSF labs &cultures pending: NGTD    -- IV ceftriaxone discontinued; started PO cefixime 400 mg daily to complete a 10 day course. Today is day 6.  -- PT/OT   Vaginal candidiasis: Patient complaining of discharge and itching this morning on rounds. Urine cultures crew 50,000 colonies of yeast / ml.  -- Diflucan 150 mg today; will sent home with script for a second dose tomorrow   Right LE Distal DVT: asymptomatic and given her complexity of presentation, we have opted to observe off full dose anticoagulation. She currently has heparin ordered for DVT prophylaxis.  -- continue observation with serial ultrasound every week for 2 weeks. If there is proximal extension, then consider anticoagulation -- ECHO and TCD bubble study negative for PFO  DM2-- last Hgb A1C was 10.5 on 08/15/2015. On levemir 80 units QAM. Her glucose has been more elevated with giving her a diet. Repeat A1C 13 this admission.  --continueSSI  --addedLevemir 40units QAM -- Close PCP follow up   HTN: Home meds initially help due to AMS and NPO status. Mental status has cleared and patient is tolerating PO.  -- Contnue home HCTZ andLisinopril .    HLD --Continue Crestor 40mg  daily  Hx of Cerebrovascular Disease with Recurrent Strokes: multiple stroke risk factors. MRI on 10/13 with small bilateral acute and subacute cerebral infarcts. -- stroke work up with elevated LDL and A1C of 13; Rosuvastatin increased 20 -> 40 mg daily  -- will hold her anti-platelets for now. Her outpatient neurologist recommended only continuing Plavix when seen in June 2017 -- Continue  ASA 325 mg per neurology   Normocytic anemia:Hemoglobin may be decreased in setting of acute illness. Currently stable. No active bleeding. - continue to monitor  FEN: No fluids, replete lytes prn, Carb modified diet VTE ppx: Heparin  Code Status: FULL   Dispo: Anticipated discharge today.   Reymundo Poll, MD 01/01/2016, 10:32 AM Pager: [phone]

## 2016-01-02 LAB — AEROBIC/ANAEROBIC CULTURE W GRAM STAIN (SURGICAL/DEEP WOUND): Culture: NO GROWTH

## 2016-01-02 LAB — AEROBIC/ANAEROBIC CULTURE (SURGICAL/DEEP WOUND)

## 2016-01-02 LAB — ANGIOTENSIN CONVERTING ENZYME, CSF: Angio Convert Enzyme: 0.7 U/L (ref 0.0–2.5)

## 2016-01-02 LAB — VARICELLA-ZOSTER BY PCR: VARICELLA-ZOSTER, PCR: NEGATIVE

## 2016-01-09 LAB — ARBOVIRUS PANEL, ~~LOC~~ LAB

## 2016-01-14 ENCOUNTER — Encounter: Payer: Self-pay | Admitting: Neurology

## 2016-01-14 ENCOUNTER — Ambulatory Visit (INDEPENDENT_AMBULATORY_CARE_PROVIDER_SITE_OTHER): Payer: 59 | Admitting: Neurology

## 2016-01-14 VITALS — BP 142/70 | HR 97 | Ht 65.0 in | Wt 226.0 lb

## 2016-01-14 DIAGNOSIS — G9340 Encephalopathy, unspecified: Secondary | ICD-10-CM | POA: Diagnosis not present

## 2016-01-14 DIAGNOSIS — E1165 Type 2 diabetes mellitus with hyperglycemia: Secondary | ICD-10-CM

## 2016-01-14 DIAGNOSIS — E1142 Type 2 diabetes mellitus with diabetic polyneuropathy: Secondary | ICD-10-CM | POA: Diagnosis not present

## 2016-01-14 DIAGNOSIS — G43009 Migraine without aura, not intractable, without status migrainosus: Secondary | ICD-10-CM

## 2016-01-14 DIAGNOSIS — I639 Cerebral infarction, unspecified: Secondary | ICD-10-CM

## 2016-01-14 DIAGNOSIS — I10 Essential (primary) hypertension: Secondary | ICD-10-CM

## 2016-01-14 DIAGNOSIS — R413 Other amnesia: Secondary | ICD-10-CM

## 2016-01-14 DIAGNOSIS — E785 Hyperlipidemia, unspecified: Secondary | ICD-10-CM

## 2016-01-14 DIAGNOSIS — I82409 Acute embolism and thrombosis of unspecified deep veins of unspecified lower extremity: Secondary | ICD-10-CM

## 2016-01-14 NOTE — Progress Notes (Signed)
Chart forwarded.  

## 2016-01-14 NOTE — Progress Notes (Addendum)
NEUROLOGY FOLLOW UP OFFICE NOTE  Kelly Romero [id number]  HISTORY OF PRESENT ILLNESS: Kelly Romero is a 50 year old right-handed female with history of uncontrolled diabetes, hypertension and depression who follows up for headache and stroke.  History obtained by patient and hospital notes from [facility].  Imaging of brain CT and MRI from 08/15/15 personally reviewed.   UPDATE: For continued stroke workup, she had a TEE on 04/19/15, which revealed EF 60-65% with no PFO or atrial septal defect or other cardiac source of emboli.  After 3 months on dual antiplatelet therapy, she was continued on Plavix alone.  She was on amitriptyline 25mg  at bedtime to help with depression and headache.   She was admitted to [facility] between 08/15/15 and 08/19/15 for right-sided (however some notes state left-sided) weakness and numbness associated with forgetfulness and agitation.  Blood pressure was elevated.  NIHSS was 4.  Due to minimal symptoms, she did not receive tPA.  Urine tox screen was negative.  CT of head showed no acute findings.  MRI of brain with and without contrast revealed possible small acute infarct in the posterior right MCA territory.  However, it appears on previous imaging, which suggests it is likely an old finding.  Neurology believed it to be an old infarct, as it apparently was seen on a prior MRI in February 2017, which I do not have.  TTE again was unremarkable with EF of 65-70%.  LDL was 231.  Hgb A1c was 10.5.  Exact cause of encephalopathy was unclear.  She underwent an EEG on 08/18/15, which revealed mild to moderate generalized background slowing but no epileptiform activity.  Neurology felt symptoms may be psychogenic.  She was started on Depakote 500mg  twice daily for possible seizures.    She was maintained on ASA 81mg  and Plavix, as well as Rosuvastatin 20mg .  She reports word-finding difficulties, which has been ongoing for several months.  She was readmitted to [facility] from  12/26/15 to 12/22/2015 for altered mental status, fever of 103 and elevated blood pressure with systolic in 220s.  MRI of brain was personally reviewed and revealed small acute and subacute infarcts in the bilateral cerebral hemisphere.  Due to fever, she was started on IV antibiotics and acyclovir.  UA and urine tox screen was negative.  Blood cultures showed 1 of 2 positive for gram +, likely contaminated.  Endocarditis was not suspected.  She underwent LP.  CSF cell count was 1, gram stain negative, protein 49, glucose 98, oligoclonal bands negative, fungal cultures, CMV, VZV, VDRL and HSV were negative.  In the serum, HIV and RPR were nonreactive.  Arbovirus panel was negative.  ACE was 26.  Hypercoagulable panel (including homocysteine, lupus anticoagulant, beta-2-glycoprotein, cardiolipin antibodies) was negative.  TTE showed normal LVEF 55-60% and TCD showed clinically insignificant right to left shunt with valsalva only.  Cerebral angiogram was performed, which revealed mild athersclerosis with 65-70% stenosis of left MCA superior division, severe stenosis of proximal PCA P1 segment and tapered stenosis of distal right pericallosal artery.  Hgb A1c was 13.  LDL was 153.  Lower extremity doppler revealed distal DVT in the right lower extremity.  Plavix was switched to ASA 325mg  daily.  DVT is being monitored and managed by her PCP.  Recommendation was to recheck venous ultrasound every 2 weeks and if there is any proximal extension, then the switch to anticoagulation.   Meanwhile, headaches are somewhat improved.  They occur once in a while. Current  NSAIDS:  Ibuprofen 800mg  Current analgesics:  no Current triptans:  no Current anti-emetic:  no Current muscle relaxants:  baclofen 5mg  Current anti-anxiolytic:  no Current sleep aide:  no Current Antihypertensive medications:  lisinopril Current Antidepressant medications:  amitriptyline 25mg , escitalopram 10mg  Current Anticonvulsant medications:   divalproex DR 500mg  twice daily Current Vitamins/Herbal/Supplements:  no Current Antihistamines/Decongestants:  no Other therapy:  no   Recent labs:  Total bili 0.6, ALP 58, AST 13, ALT 11.  WBC 4.2, HGB 8.9, HCT 28.1 and PLT 182.   HISTORY: Onset:  July 2016.  Sudden onset. Location:  Right sided, from front to back of head.  Initially had pain behind right ear. Quality:  squeezing Initial intensity:  7-8/10 Aura:  no Prodrome:  no Associated symptoms:  Some photophobia and phonophobia.  Sometimes nausea.  She denies visual disturbance. Initial Duration:  All day Initial Frequency:  Daily.  Sometimes wakes her up at night Triggers/exacerbating factors:  Loud noise Relieving factors:  Laying in a cool dark quiet room   Past abortive medication:  Tylenol, Excedrin, Percocet Past preventative medication:  none Other past therapy:  none   Current abortive medication:  Ibuprofen 800mg  Antihypertensive medications:  Lisinopril, metoprolol Antidepressant medications:  lexapro 10mg  Anticonvulsant medications:  none Vitamins/Herbal/Supplements:  none Other therapy:  none   Sed Rate was 30.  CBC and CMP were unremarkable except for elevated glucose of 201.  MRI of the brain from 11/09/14 showed chronic small vessel ischemic changes with an incidental tiny subacute infarct in the right frontal lobe.     Encephalopathy/Stroke: She was in [facility] on 12/07/14 for stroke symptoms.  She developed slurred speech and right arm numbness.   She was agitated, yelling and screaming in the ED.  She was given Ativan and Haldol.  CT of head was unremarkable.  Repeat CT of head two days later was negative.  CXR negative.  Urine tox screen was positive for opioids.  She was found to have severally elevated blood pressure with systolic at 240 and higher.  Hgb A1c was 12 with serum glucose level of 400.  She did not have fever or leukocytosis, and UA was borderline with trace leukocytes but negative  nitrite.  She was treated with 5 days of IV Rocephin.  Troponins were negative.  Etiology was unknown but TIA was suspected as a possibility.   She had a similar stroke-like episode on 02/22/15.  She woke up with headache and later developed slurred speech.  She was admitted to [facility], where MRI of brain revealed 2 mm left occipital subacute infarct.  2D echo was performed, which appeared unremarkable, but didn't make mention of a PFO.  Carotid doppler again showed no hemodynamically significant ICA stenosis.  Hypercoagulable panel, ANA, lupus panel, antiextractable nuclear antigen were negative.  LDL was 130.  Plavix was added to her ASA.  Crestor was increased to 40mg  daily.   Labs from August 2016 include Sed Rate 30, ANA negative, ANCA screen negative, antiextractable nuclear antigen (RNP abs, Smith abs) negative.  PAST MEDICAL HISTORY: Past Medical History:  Diagnosis Date  . Diabetes mellitus without complication (HCC)   . Headache   . Hyperlipidemia   . Hypertension   . TIA (transient ischemic attack)     MEDICATIONS: Current Outpatient Prescriptions on File Prior to Visit  Medication Sig Dispense Refill  . amitriptyline (ELAVIL) 25 MG tablet Take 1 tablet (25 mg total) by mouth at bedtime. 30 tablet 3  . aspirin  EC 325 MG EC tablet Take 1 tablet (325 mg total) by mouth daily. 30 tablet 1  . divalproex (DEPAKOTE) 500 MG DR tablet Take 1 tablet (500 mg total) by mouth 2 (two) times daily. 60 tablet 3  . escitalopram (LEXAPRO) 10 MG tablet Take 1 tablet (10 mg total) by mouth daily. 30 tablet 3  . ibuprofen (ADVIL,MOTRIN) 800 MG tablet Take 1 tablet (800 mg total) by mouth every 8 (eight) hours as needed. 30 tablet 2  . insulin detemir (LEVEMIR) 100 UNIT/ML injection Inject 85 Units into the skin every morning.     Marland Kitchen lisinopril (PRINIVIL,ZESTRIL) 30 MG tablet Take 1 tablet (30 mg total) by mouth daily. 30 tablet 3  . rosuvastatin (CRESTOR) 40 MG tablet Take 1 tablet (40 mg  total) by mouth at bedtime. 30 tablet 3   No current facility-administered medications on file prior to visit.     ALLERGIES: Allergies  Allergen Reactions  . Erythromycin Itching    FAMILY HISTORY: Family History  Problem Relation Age of Onset  . Stroke Father     55s  . Heart attack Father 74  . COPD Mother   . Heart failure Brother   . Cancer Maternal Grandmother     unknown   . COPD    . Heart failure Maternal Grandfather   . Hypertension Maternal Grandfather   . Cancer Paternal Grandfather     lung  . Diabetes Paternal Grandmother     SOCIAL HISTORY: Social History   Social History  . Marital status: Married    Spouse name: N/A  . Number of children: N/A  . Years of education: N/A   Occupational History  . Data processing manager    Social History Main Topics  . Smoking status: Never Smoker  . Smokeless tobacco: Never Used  . Alcohol use No     Comment: rare   . Drug use: No  . Sexual activity: No   Other Topics Concern  . Not on file   Social History Narrative  . No narrative on file    REVIEW OF SYSTEMS: Constitutional: No fevers, chills, or sweats, no generalized fatigue, change in appetite Eyes: No visual changes, double vision, eye pain Ear, nose and throat: No hearing loss, ear pain, nasal congestion, sore throat Cardiovascular: No chest pain, palpitations Respiratory:  No shortness of breath at rest or with exertion, wheezes GastrointestinaI: No nausea, vomiting, diarrhea, abdominal pain, fecal incontinence Genitourinary:  No dysuria, urinary retention or frequency Musculoskeletal:  No neck pain, back pain Integumentary: No rash, pruritus, skin lesions Neurological: as above Psychiatric: No depression, insomnia, anxiety Endocrine: No palpitations, fatigue, diaphoresis, mood swings, change in appetite, change in weight, increased thirst Hematologic/Lymphatic:  No purpura, petechiae. Allergic/Immunologic: no itchy/runny eyes, nasal congestion,  recent allergic reactions, rashes  PHYSICAL EXAM: Vitals:   01/14/16 0730  BP: (!) 142/70  Pulse: 97   General: No acute distress.   Head:  Normocephalic/atraumatic Eyes:  Fundi examined but not visualized Neck: supple, no paraspinal tenderness, full range of motion Heart:  Regular rate and rhythm Lungs:  Clear to auscultation bilaterally Back: No paraspinal tenderness Neurological Exam: alert and oriented to person, place, and time. Attention span and concentration intact, recent and remote memory intact, fund of knowledge intact.  Speech fluent and not dysarthric, language intact.  CN II-XII intact. Bulk and tone normal, muscle strength 5/5 throughout.  Sensation to light touch  intact.  Deep tendon reflexes 2+ throughout.  Finger to nose testing intact.  Gait normal, Romberg positive.  IMPRESSION: 1.  Recurrent strokes.  Etiology unclear.  Hypercoagulable workup is negative.  Vasculitis/autoimmune workup is negative.  TEE is negative for cardiac source.  She has multiple uncontrolled stroke risk factors, which may be the cause.  2.  Recurrent acute encephalopathy.  24 hour ambulatory EEG unremarkable.  May be metabolic and hypertensive encephalopathy.  Recent admission associated with fever, so viral encephalitis possible despite negative CSF. Endocarditis was not suspected.   She is no longer with fever. 3.  Migraine, stable 4.  Uncontrolled diabetes 5.  Hypertension 6.  Hyperlipidemia 7.  DVT 8. Vascular cognitive impairment  PLAN: 1.  We will restart Plavix 75mg  daily.  She will continue ASA 325mg  daily for 3 months and then stop.  She will continue on Plavix thereafter..   2.  Continue Crestor 40mg  daily as managed by PCP (LDL goal should be less than 70) 3.  Optimize blood pressure and glycemic control 4.  Continue Depakote 500mg  twice daily and amitriptyline for migraine control 5.  Some strokes are cortical, so cardiac source still possible.  Will get 30 day cardiac event  monitor.  If unremarkable, consider implantable loop recorder. 6.  Due to recurrent strokes and DVT, will check CT chest/abdomen/pelvis to look for evidence of malignancy. 7.  Follow up in 5 months.  26 minutes spent face to face with patient, over 50% spent counseling.   Shon MilletAdam Jaffe, DO  CC:  Noni SaupeJohn F [name] II, MD

## 2016-01-14 NOTE — Patient Instructions (Signed)
1.  We will check a 30 day Holter monitor  2.  We will check CT of chest abdomen and pelvis 3.  Continue aspirin daily 4.  Continue Crestor 5.  Continue blood pressure medication 6.  Mediterranean diet    Why follow it? Research shows. . Those who follow the Mediterranean diet have a reduced risk of heart disease  . The diet is associated with a reduced incidence of Parkinson's and Alzheimer's diseases . People following the diet may have longer life expectancies and lower rates of chronic diseases  . The Dietary Guidelines for Americans recommends the Mediterranean diet as an eating plan to promote health and prevent disease  What Is the Mediterranean Diet?  . Healthy eating plan based on typical foods and recipes of Mediterranean-style cooking . The diet is primarily a plant based diet; these foods should make up a majority of meals   Starches - Plant based foods should make up a majority of meals - They are an important sources of vitamins, minerals, energy, antioxidants, and fiber - Choose whole grains, foods high in fiber and minimally processed items  - Typical grain sources include wheat, oats, barley, corn, brown rice, bulgar, farro, millet, polenta, couscous  - Various types of beans include chickpeas, lentils, fava beans, black beans, white beans   Fruits  Veggies - Large quantities of antioxidant rich fruits & veggies; 6 or more servings  - Vegetables can be eaten raw or lightly drizzled with oil and cooked  - Vegetables common to the traditional Mediterranean Diet include: artichokes, arugula, beets, broccoli, brussel sprouts, cabbage, carrots, celery, collard greens, cucumbers, eggplant, kale, leeks, lemons, lettuce, mushrooms, okra, onions, peas, peppers, potatoes, pumpkin, radishes, rutabaga, shallots, spinach, sweet potatoes, turnips, zucchini - Fruits common to the Mediterranean Diet include: apples, apricots, avocados, cherries, clementines, dates, figs, grapefruits, grapes,  melons, nectarines, oranges, peaches, pears, pomegranates, strawberries, tangerines  Fats - Replace butter and margarine with healthy oils, such as olive oil, canola oil, and tahini  - Limit nuts to no more than a handful a day  - Nuts include walnuts, almonds, pecans, pistachios, pine nuts  - Limit or avoid candied, honey roasted or heavily salted nuts - Olives are central to the [employer]iterranean diet - can be eaten whole or used in a variety of dishes   Meats Protein - Limiting red meat: no more than a few times a month - When eating red meat: choose lean cuts and keep the portion to the size of deck of cards - Eggs: approx. 0 to 4 times a week  - Fish and lean poultry: at least 2 a week  - Healthy protein sources include, chicken, Malaw[place]urkey, lean beef, lamb - Increase intake of seafood such as tuna, salmon, trout, mackerel, shrimp, scallops - Avoid or limit high fat processed meats such as sausage and bacon  Dairy - Include moderate amounts of low fat dairy products  - Focus on healthy dairy such as fat free yogurt, skim milk, low or reduced fat cheese - Limit dairy products higher in fat such as whole or 2% milk, cheese, ice cream  Alcohol - Moderate amounts of red wine is ok  - No more than 5 oz daily for women (all ages) and men older than age 52  - No more than 10 oz of wine daily for men younger than 7065  Other - Limit sweets and other desserts  - Use herbs and spices instead of salt to flavor foods  - Herbs and  spices common to the traditional Mediterranean Diet include: basil, bay leaves, chives, cloves, cumin, fennel, garlic, lavender, marjoram, mint, oregano, parsley, pepper, rosemary, sage, savory, sumac, tarragon, thyme   It's not just a diet, it's a lifestyle:  . The Mediterranean diet includes lifestyle factors typical of those in the region  . Foods, drinks and meals are best eaten with others and savored . Daily physical activity is important for overall good health . This could  be strenuous exercise like running and aerobics . This could also be more leisurely activities such as walking, housework, yard-work, or taking the stairs . Moderation is the key; a balanced and healthy diet accommodates most foods and drinks . Consider portion sizes and frequency of consumption of certain foods   Meal Ideas & Options:  . Breakfast:  o Whole wheat toast or whole wheat English muffins with peanut butter & hard boiled egg o Steel cut oats topped with apples & cinnamon and skim milk  o Fresh fruit: banana, strawberries, melon, berries, peaches  o Smoothies: strawberries, bananas, greek yogurt, peanut butter o Low fat greek yogurt with blueberries and granola  o Egg white omelet with spinach and mushrooms o Breakfast couscous: whole wheat couscous, apricots, skim milk, cranberries  . Sandwiches:  o Hummus and grilled vegetables (peppers, zucchini, squash) on whole wheat bread   o Grilled chicken on whole wheat pita with lettuce, tomatoes, cucumbers or tzatziki  o Tuna salad on whole wheat bread: tuna salad made with greek yogurt, olives, red peppers, capers, green onions o Garlic rosemary lamb pita: lamb sauted with garlic, rosemary, salt & pepper; add lettuce, cucumber, greek yogurt to pita - flavor with lemon juice and black pepper  . Seafood:  o Mediterranean grilled salmon, seasoned with garlic, basil, parsley, lemon juice and black pepper o Shrimp, lemon, and spinach whole-grain pasta salad made with low fat greek yogurt  o Seared scallops with lemon orzo  o Seared tuna steaks seasoned salt, pepper, coriander topped with tomato mixture of olives, tomatoes, olive oil, minced garlic, parsley, green onions and cappers  . Meats:  o Herbed greek chicken salad with kalamata olives, cucumber, feta  o Red bell peppers stuffed with spinach, bulgur, lean ground beef (or lentils) & topped with feta   o Kebabs: skewers of chicken, tomatoes, onions, zucchini, squash  o Mal[place]urkey  burgers: made with red onions, mint, dill, lemon juice, feta cheese topped with roasted red peppers . Vegetarian o Cucumber salad: cucumbers, artichoke hearts, celery, red onion, feta cheese, tossed in olive oil & lemon juice  o Hummus and whole grain pita points with a greek salad (lettuce, tomato, feta, olives, cucumbers, red onion) o Lentil soup with celery, carrots made with vegetable broth, garlic, salt and pepper  o Tabouli salad: parsley, bulgur, mint, scallions, cucumbers, tomato, radishes, lemon juice, olive oil, salt and pepper. 7.Continue depakote and amitriptyline 8.  Follow up in 5 months.

## 2016-01-15 ENCOUNTER — Other Ambulatory Visit: Payer: Self-pay

## 2016-01-15 ENCOUNTER — Other Ambulatory Visit: Payer: Self-pay | Admitting: Neurology

## 2016-01-15 ENCOUNTER — Inpatient Hospital Stay: Admission: RE | Admit: 2016-01-15 | Payer: Self-pay | Source: Ambulatory Visit

## 2016-01-15 ENCOUNTER — Telehealth: Payer: Self-pay | Admitting: Neurology

## 2016-01-15 ENCOUNTER — Telehealth: Payer: Self-pay

## 2016-01-15 DIAGNOSIS — I10 Essential (primary) hypertension: Secondary | ICD-10-CM

## 2016-01-15 DIAGNOSIS — I4891 Unspecified atrial fibrillation: Secondary | ICD-10-CM

## 2016-01-15 DIAGNOSIS — I639 Cerebral infarction, unspecified: Secondary | ICD-10-CM

## 2016-01-15 MED ORDER — CLOPIDOGREL BISULFATE 75 MG PO TABS: 75.0000 mg | ORAL_TABLET | Freq: Every day | ORAL | 11 refills | Status: DC

## 2016-01-15 NOTE — Telephone Encounter (Signed)
-----   Message from Drema DallasAdam R Jaffe, DO sent at 01/15/2016  7:59 AM EDT ----- Please let Kelly Romero know that I spoke with the neurologist from the hospital.  The Plavix was stopped so they could do the spinal tap but was supposed to be restarted.  Therefore, I want to restart her Plavix 75mg  daily.  She should also remain on the aspirin for 3 months.  She may discontinue the aspirin in 3 months (around January 12) but continue Plavix 75mg  daily.

## 2016-01-15 NOTE — Telephone Encounter (Signed)
RX sent in. Pt aware. Pt went to have CTs done today. Insurance has denied them, per [facility] ([address] [place], [place] [address] ph: [phone] fax: [phone]). Scans were cancelled. I have not heard about denial yet, as they were just initiated. Will check into this.   [facility].

## 2016-01-15 NOTE — Telephone Encounter (Signed)
Error

## 2016-01-19 LAB — CULTURE, FUNGUS WITHOUT SMEAR

## 2016-01-22 ENCOUNTER — Ambulatory Visit (INDEPENDENT_AMBULATORY_CARE_PROVIDER_SITE_OTHER): Payer: 59

## 2016-01-22 DIAGNOSIS — I10 Essential (primary) hypertension: Secondary | ICD-10-CM | POA: Diagnosis not present

## 2016-01-22 DIAGNOSIS — I4891 Unspecified atrial fibrillation: Secondary | ICD-10-CM | POA: Diagnosis not present

## 2016-01-22 DIAGNOSIS — I639 Cerebral infarction, unspecified: Secondary | ICD-10-CM | POA: Diagnosis not present

## 2016-01-23 ENCOUNTER — Telehealth: Payer: Self-pay

## 2016-01-23 NOTE — Telephone Encounter (Signed)
Message relayed to patient. Verbalized understanding and denied questions.   

## 2016-01-23 NOTE — Telephone Encounter (Signed)
P.A. For CT abdomen/Pevlics w/o contrast has been denied.  Denial states " Your records must show you have results of a solid or complex belly mass on recent ultrasound. A mass is a growth. An ultrasound is a picture study using sound waves."  CPT 74176  Please advise.

## 2016-01-23 NOTE — Telephone Encounter (Signed)
We don't need to push for it.  My suspicion is low.  I do recommend that she keeps up with her PCP about routine screens (such as mammograms and colonoscopy)

## 2016-01-28 ENCOUNTER — Telehealth: Payer: Self-pay | Admitting: Neurology

## 2016-01-28 NOTE — Telephone Encounter (Signed)
Pt called to get your opinion on disability. Pt states she is currently using FMLA for her missed work. PCP mentioned that pt may want to consider disability. PCP only does short term disabilty, so pt wants to know if you think she should start that process if you'd be willing to fill out forms?   Pt also broke down crying while discussing disability. Stated that she is having difficulty remembering anything (short term). 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    Please advise.

## 2016-01-28 NOTE — Telephone Encounter (Signed)
PT called and wanted to speak to Dr Moises BloodJaffe's nurse/[name] CB# [phone]

## 2016-01-29 NOTE — Telephone Encounter (Signed)
We will need to first get a formal neurocognitive exam with Dr. Alinda DoomsBailar to determine what her deficits are.

## 2016-01-29 NOTE — Telephone Encounter (Signed)
Message sent to staff to schedule testing. Pt aware.

## 2016-02-27 ENCOUNTER — Telehealth: Payer: Self-pay

## 2016-02-27 DIAGNOSIS — I639 Cerebral infarction, unspecified: Secondary | ICD-10-CM

## 2016-02-27 NOTE — Telephone Encounter (Signed)
Referral to cardiology placed.

## 2016-02-27 NOTE — Telephone Encounter (Signed)
-----   Message from Drema DallasAdam R Jaffe, DO sent at 02/27/2016 10:12 AM EST ----- I spoke with Kelly Romero regarding the results of the cardiac monitor, which looked okay.  Due to recurrent stroke in a 50 year old woman with dizziness, I would like to refer her to cardiology for evaluation of an implantable loop recorder.  We discussed this, and she is agreeable.

## 2016-03-05 ENCOUNTER — Ambulatory Visit (INDEPENDENT_AMBULATORY_CARE_PROVIDER_SITE_OTHER): Payer: 59 | Admitting: Psychology

## 2016-03-05 ENCOUNTER — Encounter: Payer: Self-pay | Admitting: Psychology

## 2016-03-05 DIAGNOSIS — I639 Cerebral infarction, unspecified: Secondary | ICD-10-CM

## 2016-03-05 DIAGNOSIS — R413 Other amnesia: Secondary | ICD-10-CM | POA: Diagnosis not present

## 2016-03-05 DIAGNOSIS — F411 Generalized anxiety disorder: Secondary | ICD-10-CM

## 2016-03-05 DIAGNOSIS — G9340 Encephalopathy, unspecified: Secondary | ICD-10-CM

## 2016-03-05 NOTE — Progress Notes (Signed)
NEUROPSYCHOLOGICAL INTERVIEW (CPT: [id number])  Name: Kelly KhanPamela R Romero Date of Birth: 02/18/1966 Date of Interview: 03/05/2016  Reason for Referral:  Kelly Romero is a 50 y.o., right-handed female who is referred for neuropsychological evaluation by Dr. Shon MilletAdam Jaffe of [facility] due to concerns about cognitive changes status-post several stroke-like events. This patient is accompanied in the office by her husband, who supplements the history.  History of Presenting Problem:  Kelly Romero first saw Dr. Everlena CooperJaffe in 10/2014 for sudden onset of headache. It was noted at that time that the patient reported she had fallen and hit the right side of her head in April (no LOC); however, at today's appointment she and her husband deny any prior history of falls or head trauma. The patient then experienced a stroke like episode for which she was hospitalized in September 2016. She has had recurrent episodes and hospitalizations in 2016 and 2017. The etiology of these recurrent strokes is unclear. Per Dr. Moises BloodJaffe's notes, hypercoagulable workup is negative, vasculitis/autoimmune workup is negative, and TEE is negative for cardiac source; however, she has multiple uncontrolled stroke risk factors (including uncontrolled diabetes), which may be the cause. Workup has also been done for her recurrent acute encephalopathy. A 24 hours EEG was unremarkable. Dr. Everlena CooperJaffe notes these may be metabolic or hypertensive encephalopathy. A recent admission was associated with fever, so viral encephalitis was said to be possible (despite CSF). Endocarditis was not suspected.   Most recent neuroimaging on file is brain MRI completed on 12/27/2015 which reportedly showed small bilateral acute and subacute cerebral infarcts as described below: Brain: 1 cm and smaller areas of restricted diffusion in the bilateral cerebral hemispheres, periatrial white matter on the right and lateral/ posterior frontal cortex on the left. There  are additionally more hazy areas of DWI hyperintensity around the atrium of the left lateral ventricle and in the subcortical white matter at the vertex (the right posterior frontal infarct was acute on MRI June 2017). There are 2 areas of cortically based enhancement that have a flat appearance on coronal acquisition, present along bilateral sylvian fissure and measuring up to 14 mm length. There is no malignancy history and these are best attributed to subacute infarcts in this setting. There are chronic small infarcts in the inferior left occipital cortex and right corona radiata with hemosiderin staining. Recurrent small-vessel infarct may be related to embolic phenomenon or vasculitis.  There was no hemorrhage, hydrocephalus or shift.   At today's appointment the patient and her husband report acute onset of cognitive difficulties with her first stroke episode in September 2016. She continued to demonstrate memory difficulties upon discharge from the hospital. For example, someone would call her on the phone and afterwards she would not remember who she had talked to. Her husband had to start helping her remember appointments. Her speech was mumbled and difficult to understand. 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 She reported difficulty remembering students' names, and that her speech continued to be a problem and her arm would feel heavy. It is unclear if these symptoms were episodic or ongoing. She did report ongoing dizziness and intermittent headaches. After each stroke like event she would return back to work, until October of this year, when she started using FMLA. She reported her cognitive issues were interfering too much with her ability to work.   Currently, she and her husband report significant concern about her memory. She says she is forgetting things people just  told her and is asking the same question multiple times. Her husband says she will  start to talk and then just stop because can't remember what she was going to say.   Upon direct questioning, the patient and her husband also reported the following with regard to cognitive functioning:   Forgetting recent conversations/events: Yes Repeating statements/questions: Yes Misplacing/losing items: Yes Forgetting appointments or other obligations: Yes, relies on husband Forgetting to take medications: Yes, relies on husband to remind her  Difficulty concentrating: Unsure Starting but not finishing tasks:  Distracted easily: Sometimes per husband Processing information more slowly: Yes  Word-finding difficulty: Yes Word substitutions: Yes Spelling difficulty: Yes Comprehension difficulty: Yes  Getting lost when driving: N/A. She stopped driving after one of the strokes and husband now does all the driving.  Her husband notes that her personality has changed since the strokes. He says she is "not the same person." He reported, "She don't act the same, she don't talk the same." When asked to provide specific examples, he stated that she used to be outgoing and friendly, wanting to go do things. Now, she is not as interested or engaged.  The patient reports a long history of anxiety which has been treated with Lexapro (prescribed by her PCP) for many years. She is unsure if this was helpful. She has never participated in counseling or psychotherapy.   She denied history of substance abuse or dependence.   She has had diabetes for at least 10 years and admits great difficulty controlling it. She has difficulty checking her blood sugars ("panicks" when she has to prick her finger, so her husband does it). When [facility] was [facility] in October 2017, HgbA1c was 13.  There is no known family history of dementia or Alzheimer's disease.   Current Functioning: Kelly Romero lives with her husband in a private residence. She is no longer driving. Her husband assists with  management of her medications and appointments. He also took over the finances since she had the strokes. He has always done all the cooking.   Physically, the patient complains of dizziness, occasional headaches, increased tremors in her hands, and reduced balance.   Her husband described her current mood as "up and down all the time". She gets upset when she cannot remember something. She reported increased anxiety about having another stroke and about having dementia. She is more anxious about going to bed and about being away from her husband. She endorses financial stress. The patient is unsure if she has sleep difficulty; her husband says she goes to bed much later than she used to. She does report increased fatigue during the day, and she takes frequent naps. Her appetite is reportedly normal. She is not having hallucinations (although she did in the context of the first stroke like episode when she was also combative and agitated). She denies suicidal ideation or intention.    Social History: Born[place]Raised: [place] Education: Dropped out of HS in the 11th grade. Later attained GED and Associate's degree. 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  Marital history: Married to her current husband x19 years. She has one adult daughter from another relationship, and a step-son from her current husband. Between her own daughter and her step-son, she has 7 grandchildren altogether. Alcohol/Tobacco/Substances: No alcohol, never been a smoker, denies substance abuse. The patient denied history of trauma/abuse, "just a lot of death."   Medical History: Past Medical History:  Diagnosis Date  . Diabetes mellitus without complication (HCC)   . Headache   . Hyperlipidemia   . Hypertension   . TIA (transient ischemic attack)      Current Medications:  Outpatient Encounter Prescriptions as of 03/05/2016  Medication Sig  . amitriptyline  (ELAVIL) 25 MG tablet Take 1 tablet (25 mg total) by mouth at bedtime.  Marland Kitchen aspirin EC 325 MG EC tablet Take 1 tablet (325 mg total) by mouth daily.  . clopidogrel (PLAVIX) 75 MG tablet Take 1 tablet (75 mg total) by mouth daily.  . divalproex (DEPAKOTE) 500 MG DR tablet Take 1 tablet (500 mg total) by mouth 2 (two) times daily.  . Empagliflozin-Linagliptin (GLYXAMBI) 25-5 MG TABS Take 25 mg by mouth.  . escitalopram (LEXAPRO) 10 MG tablet Take 1 tablet (10 mg total) by mouth daily.  Marland Kitchen ibuprofen (ADVIL,MOTRIN) 800 MG tablet Take 1 tablet (800 mg total) by mouth every 8 (eight) hours as needed.  . insulin detemir (LEVEMIR) 100 UNIT/ML injection Inject 85 Units into the skin every morning.   Marland Kitchen lisinopril (PRINIVIL,ZESTRIL) 30 MG tablet Take 1 tablet (30 mg total) by mouth daily.  . rosuvastatin (CRESTOR) 40 MG tablet Take 1 tablet (40 mg total) by mouth at bedtime.   No facility-administered encounter medications on file as of 03/05/2016.      Behavioral Observations:   Appearance: Neatly and appropriately dressed and groomed Gait: Ambulated independently, no abnormalities observed Speech: Generally fluent; increased response latencies; mild stuttering at times Thought process: Generally linear Affect: Blunted, dysthymic Interpersonal: Pleasant, appropriate   TESTING: There is medical necessity to proceed with neuropsychological assessment as the results will be used to aid in differential diagnosis and clinical decision-making and to inform specific treatment recommendations. Medical records indicate a history of recurrent strokes over the past 1-2 years, with multiple small cerebral infarcts on neuroimaging. Per the patient, her husband and medical records reviewed, there has been a change in cognitive functioning and a reasonable suspicion of vascular cognitive impairment.   PLAN: The patient will return for a full battery of neuropsychological testing with a psychometrician under my  supervision. Education regarding testing procedures was provided. Subsequently, the patient will see this provider for a follow-up session at which time her test performances and my impressions and treatment recommendations will be reviewed in detail.   Full neuropsychological evaluation report to follow.

## 2016-03-10 ENCOUNTER — Ambulatory Visit (INDEPENDENT_AMBULATORY_CARE_PROVIDER_SITE_OTHER): Payer: 59 | Admitting: Psychology

## 2016-03-10 DIAGNOSIS — I639 Cerebral infarction, unspecified: Secondary | ICD-10-CM | POA: Diagnosis not present

## 2016-03-10 DIAGNOSIS — G9340 Encephalopathy, unspecified: Secondary | ICD-10-CM

## 2016-03-10 DIAGNOSIS — R413 Other amnesia: Secondary | ICD-10-CM

## 2016-03-10 NOTE — Progress Notes (Signed)
   Neuropsychology Note  Kelly Romero returned today for 4 hours of neuropsychological testing with technician, Kelly Romero, BS, under the supervision of Dr. Elvis CoilMaryBeth Bailar. The patient did not appear overtly distressed by the testing session but did report having a headache and some dizziness per behavioral observation or via self-report to the technician. Rest breaks were offered. Kelly Romero will return within 2 weeks for a feedback session with Kelly Romero at which time her test performances, clinical impressions and treatment recommendations will be reviewed in detail. The patient understands she can contact our office should she require our assistance before this time.  Full report to follow.

## 2016-03-17 NOTE — Progress Notes (Deleted)
NEUROPSYCHOLOGICAL EVALUATION   Name:    Kelly Romero  Date of Birth:   January 24, 1966 Date of Interview:  03/05/2016 Date of Testing:  03/10/2016   Date of Feedback:  03/17/2016       Background Information:  Reason for Referral:  Kelly Romero is a 51 y.o., right-handed female referred by Dr. Shon Millet to assess her current level of cognitive functioning and assist in differential diagnosis. The current evaluation consisted of a review of available medical records, an interview with the patient and her husband, and the completion of a neuropsychological testing battery. Informed consent was obtained.  History of Presenting Problem:  Kelly Romero first saw Dr. Everlena Cooper in 10/2014 for sudden onset of headache. It was noted at that time that the patient reported she had fallen and hit the right side of her head in April (no LOC); however, at today's appointment she and her husband deny any prior history of falls or head trauma. The patient then experienced a stroke like episode for which she was hospitalized in September 2016. She has had recurrent episodes and hospitalizations in 2016 and 2017. The etiology of these recurrent strokes is unclear. Per Dr. Moises Blood notes, hypercoagulable workup is negative, vasculitis/autoimmune workup is negative, and TEE is negative for cardiac source; however, she has multiple uncontrolled stroke risk factors (including uncontrolled diabetes), which may be the cause. Workup has also been done for her recurrent acute encephalopathy. A 24 hours EEG was unremarkable. Dr. Everlena Cooper notes these may be metabolic or hypertensive encephalopathy. A recent admission was associated with fever, so viral encephalitis was said to be possible (despite CSF). Endocarditis was not suspected.   Most recent neuroimaging on file is brain MRI completed on 12/27/2015 which reportedly showed small bilateral acute and subacute cerebral infarcts as described below: Brain: 1 cm and  smaller areas of restricted diffusion in the bilateral cerebral hemispheres, periatrial white matter on the right and lateral/ posterior frontal cortex on the left. There are additionally more hazy areas of DWI hyperintensity around the atrium of the left lateral ventricle and in the subcortical white matter at the vertex (the right posterior frontal infarct was acute on MRI June 2017). There are 2 areas of cortically based enhancement that have a flat appearance on coronal acquisition, present along bilateral sylvian fissure and measuring up to 14 mm length. There is no malignancy history and these are best attributed to subacute infarcts in this setting. There are chronic small infarcts in the inferior left occipital cortex and right corona radiata with hemosiderin staining. Recurrent small-vessel infarct may be related to embolic phenomenon or vasculitis.  There was no hemorrhage, hydrocephalus or shift.   At her clinical interview with me on 03/05/2016 the patient and her husband reported acute onset of cognitive difficulties with her first stroke episode in September 2016. She continued to demonstrate memory difficulties upon discharge from the hospital. For example, someone would call her on the phone and afterwards she would not remember who she had talked to. Her husband had to start helping her remember appointments. Her speech was mumbled and difficult to understand. 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 She reported difficulty remembering students' names, and that her speech continued to be a problem and her arm would feel heavy. It is unclear if these symptoms were episodic or ongoing. She did report ongoing dizziness and intermittent headaches. After each stroke like event she would return back to work, until  October of this year, when she started using FMLA. She reported her cognitive issues were interfering too much with her ability to work.    Currently, she and her husband report significant concern about her memory. She says she is forgetting things people just told her and is asking the same question multiple times. Her husband says she will start to talk and then just stop because can't remember what she was going to say.   Upon direct questioning, the patient and her husband also reported the following with regard to cognitive functioning:   Forgetting recent conversations/events: Yes Repeating statements/questions: Yes Misplacing/losing items: Yes Forgetting appointments or other obligations: Yes, relies on husband Forgetting to take medications: Yes, relies on husband to remind her  Difficulty concentrating: Unsure Starting but not finishing tasks:  Distracted easily: Sometimes per husband Processing information more slowly: Yes  Word-finding difficulty: Yes Word substitutions: Yes Spelling difficulty: Yes Comprehension difficulty: Yes  Getting lost when driving: N/A. She stopped driving after one of the strokes and husband now does all the driving.  Her husband notes that her personality has changed since the strokes. He says she is "not the same person." He reported, "She don't act the same, she don't talk the same." When asked to provide specific examples, he stated that she used to be outgoing and friendly, wanting to go do things. Now, she is not as interested or engaged.  The patient reports a long history of anxiety which has been treated with Lexapro (prescribed by her PCP) for many years. She is unsure if this was helpful. She has never participated in counseling or psychotherapy.   She denied history of substance abuse or dependence.   She has had diabetes for at least 10 years and admits great difficulty controlling it. She has difficulty checking her blood sugars (she "panics" when she has to prick her finger, so her husband does it). When she was hospitalized at [facility] in October 2017,  HgbA1c was 13.  There is no known family history of dementia or Alzheimer's disease.   Current Functioning: Kelly Romero lives with her husband in a private residence. She is no longer driving. Her husband assists with management of her medications and appointments. He also took over the finances since she had the strokes. He has always done all the cooking.   Physically, the patient complains of dizziness, occasional headaches, increased tremors in her hands, and reduced balance.   Her husband described her current mood as "up and down all the time". She gets upset when she cannot remember something. She reported increased anxiety about having another stroke and about having dementia. She is more anxious about going to bed and about being away from her husband. She endorses financial stress. The patient is unsure if she has sleep difficulty; her husband says she goes to bed much later than she used to. She does report increased fatigue during the day, and she takes frequent naps. Her appetite is reportedly normal. She is not having hallucinations (although she did in the context of the first stroke like episode when she was also combative and agitated). She denies suicidal ideation or intention.    Social History: Born/Raised: [employer] Education: Dropped out of HS in the 11th grade. Later attained GED and Associate's degree. 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 Marital history: Married to her current husband x19 years. She has one adult daughter from another relationship,  and a step-son from her current husband. Between her own daughter and her step-son, she has 7 grandchildren altogether. Alcohol/Tobacco/Substances: No alcohol, never been a smoker, denies substance abuse. The patient denied history of trauma/abuse, "just a lot of death."  Medical History:  Past Medical History:  Diagnosis Date  . Diabetes  mellitus without complication (HCC)   . Headache   . Hyperlipidemia   . Hypertension   . TIA (transient ischemic attack)     Current medications:  Outpatient Encounter Prescriptions as of 03/19/2016  Medication Sig  . amitriptyline (ELAVIL) 25 MG tablet Take 1 tablet (25 mg total) by mouth at bedtime.  Marland Kitchen aspirin EC 325 MG EC tablet Take 1 tablet (325 mg total) by mouth daily.  . clopidogrel (PLAVIX) 75 MG tablet Take 1 tablet (75 mg total) by mouth daily.  . divalproex (DEPAKOTE) 500 MG DR tablet Take 1 tablet (500 mg total) by mouth 2 (two) times daily.  . Empagliflozin-Linagliptin (GLYXAMBI) 25-5 MG TABS Take 25 mg by mouth.  . escitalopram (LEXAPRO) 10 MG tablet Take 1 tablet (10 mg total) by mouth daily.  Marland Kitchen ibuprofen (ADVIL,MOTRIN) 800 MG tablet Take 1 tablet (800 mg total) by mouth every 8 (eight) hours as needed.  . insulin detemir (LEVEMIR) 100 UNIT/ML injection Inject 85 Units into the skin every morning.   Marland Kitchen lisinopril (PRINIVIL,ZESTRIL) 30 MG tablet Take 1 tablet (30 mg total) by mouth daily.  . rosuvastatin (CRESTOR) 40 MG tablet Take 1 tablet (40 mg total) by mouth at bedtime.   No facility-administered encounter medications on file as of 03/19/2016.     Current Examination:  Behavioral Observations:  Appearance: Neatly and appropriately dressed and groomed Gait: Ambulated independently, no abnormalities observed Speech: Generally fluent; increased response latencies; mild stuttering at times Thought process: Generally linear Affect: Blunted, dysthymic Interpersonal: Pleasant, appropriate Orientation: Oriented to name and date of birth but provided inaccurate age ("58"). Oriented to month and date but could not recall year or day of the week. Oriented to city/state but inaccurately reported location [facility]"). Accurately named the current President and his predecessor. At her testing session on 03/10/2016, she and her husband reported that she had taken a fall over the  weekend in her home. She did not sustain any injuries and did not seek medical attention. She did complain of a headache and some dizziness.   Tests Administered: . Test of Premorbid Functioning (TOPF) . Wechsler Adult Intelligence Scale-Fourth Edition (WAIS-IV): Similarities, Clinical cytogeneticist, Arithmetic, Symbol Search, Coding and Digit Span subtests . Wechsler Memory Scale-Fourth Edition (WMS-IV) Adult Version (ages 26-69): Logical Memory I, II and Recognition subtests  . DIRECTV Verbal Learning Test - 2nd Edition (CVLT-2) Short Form . Rey Complex Figure Test (RCFT) . Repeatable Battery for the Assessment of Neuropsychological Status (RBANS) Form A:  Semantic Fluency subtest . Controlled Oral Word Association Test (COWAT) . Trail Making Test A and B . Neuropsychological Assessment Battery (NAB) Language Module, Form 1:  Naming subtest . Boston Diagnostic Aphasia Examination: Commands subtest . Beck Depression Inventory - Second edition (BDI-II) . Generalized Anxiety Disorder - 7 item screener (GAD-7) . Personality Assessment Inventory (PAI) . Green's WMT  Test Results: Standardized scores are presented only for use by appropriately trained professionals and to allow for any future test-retest comparison. These scores should not be interpreted without consideration of all the information that is contained in the rest of the report. The most recent standardization samples from the test publisher or other sources were used whenever  possible to derive standard scores; scores were corrected for age, gender, ethnicity and education when available.   NOTE: The following test performances may not provide a completely valid estimate of Mrs. Szafranski's current neuropsychological functioning as there was evidence of variable effort to engage in the cognitive tests. True abilities are thought to be of at least the level reported here and deficits cannot be assumed to be genuine.  Test  Scores:  ***  Description of Test Results:  Embedded performance validity indicators revealed suboptimal levels of effort, and the patient performed poorly on a test of memory malingering. As such, the patient's current performance on neurocognitive testing may not accurately reflect her true cognitive abilities, and results of neuropsychological testing cannot be validly interpreted. Nonetheless, it should be noted that the patient performed within normal limits on tests of confrontation naming, visual-spatial construction, and visual memory recognition, which at least argues against deficits in these domains of cognitive function.  With regard to emotional functioning, on a self-report questionnaire of mood (BDI-II), the patient's responses were indicative of clinically significant depression at the present time. Symptoms endorsed included: loss of interest, tearfulness, increased sleep, reduced libido, fatigue, guilty feelings, loss of self-confidence, self-criticalness, restlessness, indecisiveness, feeling less worthwhile, loss of energy, irritability and concentration difficulty. She denied suicidal ideation or intention. On another self-report measure (GAD-7), she reported a moderate level of generalized anxiety characterized by inability to control worrying, worrying too much about different things, and difficulty relaxing.  On a more extensive measure of psychopathology and personality (PAI), the patient's scores on certain validity scales suggest that while she did attend to item content in responding to PAI items, there appears to have been some inconsistent responses to similar items, and this inconsistency could affect test results.  Thus, the interpretive hypotheses that follow in this report should be reviewed cautiously. The degree to which response styles may have affected or distorted the report of symptomatology on the inventory is also assessed.  The scores for these indicators fall in  the normal range, suggesting that the patient answered in a reasonably forthright manner and did not attempt to present an unrealistic or inaccurate impression that was either more negative or more positive than the clinical picture would warrant.  [name] PAI clinical profile is marked by significant elevations across a number of different scales, indicating a broad range of clinical features and increasing the possibility of multiple diagnoses.  The configuration of the clinical scales suggests a person who is reporting marked distress, with particular concerns about her physical functioning.  The patient sees her life as severely disrupted by a variety of physical problems, some of which may be stress-related.  These problems have left her tense, unhappy, and have probably impaired her ability to concentrate on or perform important life tasks.  [name] somatic concerns may have led to some friction in her close relationships and other people may see the patient as complaining and demanding. The patient demonstrates an unusual degree of concern about physical functioning and health matters and probable impairment arising from somatic symptoms. [This is not surprising given her recent medical events.] She is likely to report that her daily functioning has been compromised by numerous and varied physical problems.  She feels that her health is not as good as that of her age peers and likely believes that her health problems are complex and difficult to treat successfully.  Physical complaints are likely to focus on symptoms of distress in neurological and musculoskeletal  systems, and may involve features often associated with conversion disorders, such as unusual sensory or motor dysfunction.  She is likely to be continuously concerned with her health status and physical problems.  Her social interactions and conversations tend to focus on her health problems, and her self-image may be largely  influenced by a belief that she is handicapped by her poor health. The patient indicates that she is experiencing a discomforting level of anxiety and tension.  She is likely to be plagued by worry to the degree that her ability to concentrate and attend are significantly compromised.  Associates are likely to comment about her overconcern regarding issues and events over which she has no control.  Affectively, she feels a great deal of tension, has difficulty relaxing, and likely experiences fatigue as a result of high perceived stress.  Overt physical signs of tension and stress, such as sweaty palms, trembling hands, complaints of irregular heartbeats, and shortness of breath are also present. The patient indicates that she is experiencing specific fears or anxiety surrounding some situations.  The pattern of responses reveals that she is likely to display significant symptoms in the area of obsessional and/or compulsive thoughts and behaviors.  She is probably seen by others as being something of a perfectionist.  She is likely to be a fairly rigid individual who follows her personal guidelines for conduct in an inflexible and unyielding manner.  She ruminates about matters to the degree that she often has difficulty making decisions and perceiving the larger significance of decisions that are made.  Changes in routine, unexpected events, and contradictory information are likely to generate untoward stress.  She may fear her own impulses and doubt her ability to control them. The patient reports some difficulties consistent with relatively mild or transient depressive symptomatology.   The patient indicates that she is quite moody and emotionally labile, with the mood swings being fairly rapid and rather extreme.  In particular, she may experience episodes of poorly controlled anger, although other affects are also likely to be poorly controlled. According to the patient's self-report, she describes NO  significant problems in the following areas: unusual thoughts or peculiar experiences; antisocial behavior; problems with empathy; undue suspiciousness or hostility; unusually elevated mood or heightened activity.  Also, she reports NO significant problems with alcohol or drug abuse or dependence.  With respect to suicidal ideation, the patient is NOT reporting distress from thoughts of self-harm.   Clinical Impressions: Adjustment disorder with depression and anxiety. Cognitive diagnosis deferred due to poor effort on testing.  The patient is reporting significant emotional distress in response to medical issues and associated stressors over the past year. She endorses significant anxiety and milder depression, which I would classify as an adjustment reaction. It is unclear if there is any pre-existing anxiety disorder (predating her stroke episodes).  Unfortunately, results of cognitive testing could not be interpreted due to evidence of suboptimal effort. This does not rule out the presence of cognitive symptoms, but I am unable to determine the nature or extent of any cognitive dysfunction that may exist.     Recommendations/Plan: Based on the findings of the present evaluation, the following recommendations are offered:  1. Treatment for anxiety/depression: Continued psychopharmacological treatment is indicated. She should follow up with a psychiatrist regularly to monitor effectiveness of medications. She may also benefit from individual counseling with a therapist. 2. Optimal control of vascular risk factors (ie, diabetes) is strongly encouraged in order to reduce the risk of future stroke  and vascular related cognitive decline. She should regularly monitor blood sugars, follow dietary recommendations of her treating provider(s), and engage in safe cardiovascular exercise.  3. The patient is complaining of short term memory difficulties in her daily life, and she is therefore encouraged to use a  daily log of events (and review regularly), take notes on important information, make to-do lists, keep one (easily visible) calendar, and make written reminders/alarms for time sensitive information.    Feedback to Patient: Haskel Khanamela R Lindner returned for a feedback appointment on 03/19/2016 to review the results of her neuropsychological evaluation with this provider. *** minutes face-to-face time was spent reviewing her test results, my impressions and my recommendations as detailed above.    Total time spent on this patient's case: 90791x1 unit for interview with psychologist; [phone] [id number] units of testing by psychometrician under psychologist's supervision; [phone] units for medical record review, scoring of neuropsychological tests, interpretation of test results, preparation of this report, and review of results to the patient by psychologist.      Thank you for your referral of Haskel Khanamela R Fetherolf. Please feel free to contact me if you have any questions or concerns regarding this report.

## 2016-03-19 ENCOUNTER — Encounter: Payer: 59 | Admitting: Psychology

## 2016-03-23 ENCOUNTER — Encounter: Payer: Self-pay | Admitting: Psychology

## 2016-03-23 ENCOUNTER — Ambulatory Visit (INDEPENDENT_AMBULATORY_CARE_PROVIDER_SITE_OTHER): Payer: 59 | Admitting: Psychology

## 2016-03-23 DIAGNOSIS — R413 Other amnesia: Secondary | ICD-10-CM

## 2016-03-23 DIAGNOSIS — I639 Cerebral infarction, unspecified: Secondary | ICD-10-CM

## 2016-03-23 DIAGNOSIS — F4323 Adjustment disorder with mixed anxiety and depressed mood: Secondary | ICD-10-CM

## 2016-03-23 NOTE — Patient Instructions (Signed)
Recommendations/Plan: Based on the findings of the present evaluation, the following recommendations are offered:   1. Treatment for anxiety/depression: Continued psychopharmacological treatment is indicated. She should follow up with a psychiatrist regularly to monitor effectiveness of medications. She may also benefit from individual counseling with a therapist.  2. Optimal control of vascular risk factors (ie, diabetes) is strongly encouraged in order to reduce the risk of future stroke and vascular related cognitive decline. She should regularly monitor blood sugars, follow dietary recommendations of her treating provider(s), and engage in safe cardiovascular exercise.   3. The patient is complaining of short term memory difficulties in her daily life, and she is therefore encouraged to use a daily log of events (and review regularly), take notes on important information, make to-do lists, keep one (easily visible) calendar, and make written reminders/alarms for time sensitive information.

## 2016-03-23 NOTE — Progress Notes (Signed)
NEUROPSYCHOLOGICAL EVALUATION     Name:                                      Kelly Romero    Date of Birth:                           10/11/1965 Date of Interview:                    03/05/2016 Date of Testing:                      03/10/2016                   Date of Feedback:                  03/23/2016                                       Background Information:   Reason for Referral:  Kelly Romero is a 50 y.o., right-handed female referred by Dr. Shon Millet to assess her current level of cognitive functioning and assist in differential diagnosis. The current evaluation consisted of a review of available medical records, an interview with the patient and her husband, and the completion of a neuropsychological testing battery. Informed consent was obtained.   History of Presenting Problem:  Kelly Romero first saw Dr. Everlena Cooper in 10/2014 for sudden onset of headache. It was noted at that time that the patient reported she had fallen and hit the right side of her head in April (no LOC); however, at today's appointment she and her husband deny any prior history of falls or head trauma. The patient then experienced a stroke like episode for which she was hospitalized in September 2016. She has had recurrent episodes and hospitalizations in 2016 and 2017. The etiology of these recurrent strokes is unclear. Per Dr. Moises Blood notes, hypercoagulable workup is negative, vasculitis/autoimmune workup is negative, and TEE is negative for cardiac source; however, she has multiple uncontrolled stroke risk factors (including uncontrolled diabetes), which may be the cause. Workup has also been done for her recurrent acute encephalopathy. A 24 hours EEG was unremarkable. Dr. Everlena Cooper notes these may be metabolic or hypertensive encephalopathy. A recent admission was associated with fever, so viral encephalitis was said to be possible (despite CSF). Endocarditis was not suspected.    Most recent neuroimaging  on file is brain MRI completed on 12/27/2015 which reportedly showed small bilateral acute and subacute cerebral infarcts as described below: Brain: 1 cm and smaller areas of restricted diffusion in the bilateral cerebral hemispheres, periatrial white matter on the right and lateral/ posterior frontal cortex on the left. There are additionally more hazy areas of DWI hyperintensity around the atrium of the left lateral ventricle and in the subcortical white matter at the vertex (the right posterior frontal infarct was acute on MRI June 2017). There are 2 areas of cortically based enhancement that have a flat appearance on coronal acquisition, present along bilateral sylvian fissure and measuring up to 14 mm length. There is no malignancy history and these are best attributed to subacute infarcts in this setting. There are chronic small infarcts in the inferior left occipital cortex  and right corona radiata with hemosiderin staining. Recurrent small-vessel infarct may be related to embolic phenomenon or vasculitis.   There was no hemorrhage, hydrocephalus or shift.    At her clinical interview with me on 03/05/2016 the patient and her husband reported acute onset of cognitive difficulties with her first stroke episode in September 2016. She continued to demonstrate memory difficulties upon discharge from the hospital. For example, someone would call her on the phone and afterwards she would not remember who she had talked to. Her husband had to start helping her remember appointments. Her speech was mumbled and difficult to understand. 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 She reported difficulty remembering students' names, and that her speech continued to be a problem and her arm would feel heavy. It is unclear if these symptoms were episodic or ongoing. She did report ongoing dizziness and intermittent headaches. After each stroke like event she would return back  to work, until October of this year, when she started using FMLA. She reported her cognitive issues were interfering too much with her ability to work.    Currently, she and her husband report significant concern about her memory. She says she is forgetting things people just told her and is asking the same question multiple times. Her husband says she will start to talk and then just stop because can't remember what she was going to say.    Upon direct questioning, the patient and her husband also reported the following with regard to cognitive functioning:    Forgetting recent conversations/events: Yes Repeating statements/questions: Yes Misplacing/losing items: Yes Forgetting appointments or other obligations: Yes, relies on husband Forgetting to take medications: Yes, relies on husband to remind her   Difficulty concentrating: Unsure Starting but not finishing tasks:  Distracted easily: Sometimes per husband Processing information more slowly: Yes   Word-finding difficulty: Yes Word substitutions: Yes Spelling difficulty: Yes Comprehension difficulty: Yes   Getting lost when driving: N/A. She stopped driving after one of the strokes and husband now does all the driving.   Her husband notes that her personality has changed since the strokes. He says she is "not the same person." He reported, "She don't act the same, she don't talk the same." When asked to provide specific examples, he stated that she used to be outgoing and friendly, wanting to go do things. Now, she is not as interested or engaged.   The patient reports a long history of anxiety which has been treated with Lexapro (prescribed by her PCP) for many years. She is unsure if this was helpful. She has never participated in counseling or psychotherapy.    She denied history of substance abuse or dependence.    She has had diabetes for at least 10 years and admits great difficulty controlling it. She has difficulty checking  her blood sugars (she "panics" when she has to prick her finger, so her husband does it). When [facility] in [facility], HgbA1c was 13.   There is no known family history of dementia or Alzheimer's disease.     Current Functioning: Kelly Romero lives with her husband in a private residence. She is no longer driving. Her husband assists with management of her medications and appointments. He also took over the finances since she had the strokes. He has always done all the cooking.    Physically, the patient complains of dizziness, occasional headaches, increased tremors in her hands, and reduced  balance.    Her husband described her current mood as "up and down all the time". She gets upset when she cannot remember something. She reported increased anxiety about having another stroke and about having dementia. She is more anxious about going to bed and about being away from her husband. She endorses financial stress. The patient is unsure if she has sleep difficulty; her husband says she goes to bed much later than she used to. She does report increased fatigue during the day, and she takes frequent naps. Her appetite is reportedly normal. She is not having hallucinations (although she did in the context of the first stroke like episode when she was also combative and agitated). She denies suicidal ideation or intention.      Social History: Born/Raised: [employer] Education: Dropped out of HS in the 11th grade. Later attained GED and Associate's degree. 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 Marital history: Married to her current husband x19 years. She has one adult daughter from another relationship, and a step-son from her current husband. Between her own daughter and her step-son, she has 7 grandchildren altogether. Alcohol/Tobacco/Substances: No alcohol, never been a smoker, denies substance  abuse. The patient denied history of trauma/abuse, "just a lot of death."   Medical History:  Past Medical History:  Diagnosis Date  . Diabetes mellitus without complication (HCC)   . Headache   . Hyperlipidemia   . Hypertension   . TIA (transient ischemic attack)     Current medications:  Outpatient Encounter Prescriptions as of 03/23/2016  Medication Sig  . amitriptyline (ELAVIL) 25 MG tablet Take 1 tablet (25 mg total) by mouth at bedtime.  Marland Kitchen aspirin EC 325 MG EC tablet Take 1 tablet (325 mg total) by mouth daily.  . clopidogrel (PLAVIX) 75 MG tablet Take 1 tablet (75 mg total) by mouth daily.  . divalproex (DEPAKOTE) 500 MG DR tablet Take 1 tablet (500 mg total) by mouth 2 (two) times daily.  . Empagliflozin-Linagliptin (GLYXAMBI) 25-5 MG TABS Take 25 mg by mouth.  . escitalopram (LEXAPRO) 10 MG tablet Take 1 tablet (10 mg total) by mouth daily.  Marland Kitchen ibuprofen (ADVIL,MOTRIN) 800 MG tablet Take 1 tablet (800 mg total) by mouth every 8 (eight) hours as needed.  . insulin detemir (LEVEMIR) 100 UNIT/ML injection Inject 85 Units into the skin every morning.   Marland Kitchen lisinopril (PRINIVIL,ZESTRIL) 30 MG tablet Take 1 tablet (30 mg total) by mouth daily.  . rosuvastatin (CRESTOR) 40 MG tablet Take 1 tablet (40 mg total) by mouth at bedtime.   No facility-administered encounter medications on file as of 03/23/2016.     Current Examination:   Behavioral Observations:  Appearance: Neatly and appropriately dressed and groomed Gait: Ambulated independently, no abnormalities observed Speech: Generally fluent; increased response latencies; mild stuttering at times Thought process: Generally linear Affect: Blunted, dysthymic Interpersonal: Pleasant, appropriate Orientation: Oriented to name and date of birth but provided inaccurate age ("43"). Oriented to month and date but could not recall year or day of the week. Oriented to city/state but inaccurately reported location [facility] Accurately  named the current President and his predecessor. At her testing session on 03/10/2016, she and her husband reported that she had taken a fall over the weekend in her home. She did not sustain any injuries and did not seek medical attention. She did complain of a headache and some dizziness.    Tests Administered:  Test  of Premorbid Functioning (TOPF)  Wechsler Adult Intelligence Scale-Fourth Edition (WAIS-IV): Similarities, Clinical cytogeneticist, Arithmetic, Symbol Search, Coding and Digit Span subtests  Wechsler Memory Scale-Fourth Edition (WMS-IV) Adult Version (ages 27-69): Logical Memory I, II and Recognition subtests   [place] Verbal Learning Test - 2nd Edition (CVLT-2) Short Form  Rey Complex Figure Test (RCFT)  Repeatable Battery for the Assessment of Neuropsychological Status (RBANS) Form A:  Semantic Fluency subtest  Controlled Oral Word Association Test (COWAT)  Trail Making Test A and B  Neuropsychological Assessment Battery (NAB) Language Module, Form 1:  Naming subtest  [place] Diagnostic Aphasia Examination: Commands subtest  Beck Depression Inventory - Second edition (BDI-II)  Generalized Anxiety Disorder - 7 item screener (GAD-7)  Personality Assessment Inventory (PAI)  Green's WMT   Test Results: Standardized scores are presented only for use by appropriately trained professionals and to allow for any future test-retest comparison. These scores should not be interpreted without consideration of all the information that is contained in the rest of the report. The most recent standardization samples from the test publisher or other sources were used whenever possible to derive standard scores; scores were corrected for age, gender, ethnicity and education when available.    NOTE: The following test performances may not provide a completely valid estimate of Mrs. Francois's current neuropsychological functioning as there was evidence of variable effort to engage in the  cognitive tests. True abilities are thought to be of at least the level reported here and deficits cannot be assumed to be genuine.   Test Scores:   Test Name Raw Score Standardized Score Descriptor  TOPF 15/70 SS= 73 Borderline  WAIS-IV Subtests     Similarities 13/36 ss= 4 Impaired  Block Design 20/66 ss= 6 Low average  Arithmetic 8/22 ss= 5 Borderline  Symbol Search 4/60 ss= 1 Impaired  Coding 19/135 ss= 2 Impaired  Digit Span 12/48 ss= 2 Impaired  WAIS-IV Index Scores     Working Memory  SS= 63 Extremely low  Processing Speed  SS= 53 Extremely low  WMS-IV Subtests     LM I 6/50 ss= 1 Impaired  LM II 1/50 ss= 1 Impaired  LM II Recognition 14/30 Cum %: <2 Impaired  CVLT-II Scores     Trial 1 3/9 Z= -2.5 Impaired  Trial 4 4/9 Z= -3.5 Severely impaired  Trials 1-4 total 12/36 T= 11 Severely impaired  SD Free Recall 1/9 Z= -3.5 Severely impaired  LD Free Recall 0/9 Z= -3.5 Severely impaired  LD Cued Recall 0/9 Z= -4 Severely impaired  Recognition Discriminability 5/9 hits, 5 false positives Z= -2.5 Impaired  Forced Choice Recognition 6/9  Abnormal  RCFT     Copy 9.5/36 <1%ile Impaired  3' Recall 0/36 T= <20 Severely impaired  30' Recall 0/36 T= <20 Severely impaired  Recognition 19/24 T= 41 Low average  RBANS Subtest     Semantic Fluency 8 Z= -2.6 Severely impaired  COWAT-FAS 9 T= 18 Severely impaired  COWAT-Animals 9 T= 26 Impaired  Trail Making Test A  159"  1 error T= -67 Severely impaired  Trail Making Test B  Pt unable    NAB Language subtest     Naming 29/31 T= 42 Low average  BDAE subtest     Commands 14/15    BDI-II 19/63  Mild  GAD-7 10/21  Moderate  PAI  Only elevated clinical scales are shown here   NIM  T= 70   SOM  T= 82   ANX  T=  77   ARD  T= 74       Description of Test Results:   Embedded performance validity indicators revealed suboptimal levels of effort, and the patient performed poorly on a test of memory malingering. As such, the  patient's current performance on neurocognitive testing may not accurately reflect her true cognitive abilities, and results of neuropsychological testing cannot be validly interpreted. Nonetheless, it should be noted that the patient performed within normal limits on tests of confrontation naming, visual-spatial construction, and visual memory recognition, which at least argues against deficits in these domains of cognitive function.   With regard to emotional functioning, on a self-report questionnaire of mood (BDI-II), the patient's responses were indicative of clinically significant depression at the present time. Symptoms endorsed included: loss of interest, tearfulness, increased sleep, reduced libido, fatigue, guilty feelings, loss of self-confidence, self-criticalness, restlessness, indecisiveness, feeling less worthwhile, loss of energy, irritability and concentration difficulty. She denied suicidal ideation or intention. On another self-report measure (GAD-7), she reported a moderate level of generalized anxiety characterized by inability to control worrying, worrying too much about different things, and difficulty relaxing.   On a more extensive measure of psychopathology and personality (PAI), the patient's scores on certain validity scales suggest that while she did attend to item content in responding to PAI items, there appears to have been some inconsistent responses to similar items, and this inconsistency could affect test results.  Thus, the interpretive hypotheses that follow in this report should be reviewed cautiously. The degree to which response styles may have affected or distorted the report of symptomatology on the inventory is also assessed.  The scores for these indicators fall in the normal range, suggesting that the patient answered in a reasonably forthright manner and did not attempt to present an unrealistic or inaccurate impression that was either more negative or more positive  than the clinical picture would warrant.  [name] PAI clinical profile is marked by significant elevations across a number of different scales, indicating a broad range of clinical features and increasing the possibility of multiple diagnoses.  The configuration of the clinical scales suggests a person who is reporting marked distress, with particular concerns about her physical functioning.  The patient sees her life as severely disrupted by a variety of physical problems, some of which may be stress-related.  These problems have left her tense, unhappy, and have probably impaired her ability to concentrate on or perform important life tasks.  [name] somatic concerns may have led to some friction in her close relationships and other people may see the patient as complaining and demanding. The patient demonstrates an unusual degree of concern about physical functioning and health matters and probable impairment arising from somatic symptoms. [This is not surprising given her recent medical events.] She is likely to report that her daily functioning has been compromised by numerous and varied physical problems.  She feels that her health is not as good as that of her age peers and likely believes that her health problems are complex and difficult to treat successfully.  Physical complaints are likely to focus on symptoms of distress in neurological and musculoskeletal systems, and may involve features often associated with conversion disorders, such as unusual sensory or motor dysfunction.  She is likely to be continuously concerned with her health status and physical problems.  Her social interactions and conversations tend to focus on her health problems, and her self-image may be largely influenced by a belief that she is handicapped by  her poor health. The patient indicates that she is experiencing a discomforting level of anxiety and tension.  She is likely to be plagued by worry to the degree  that her ability to concentrate and attend are significantly compromised.  Associates are likely to comment about her overconcern regarding issues and events over which she has no control.  Affectively, she feels a great deal of tension, has difficulty relaxing, and likely experiences fatigue as a result of high perceived stress.  Overt physical signs of tension and stress, such as sweaty palms, trembling hands, complaints of irregular heartbeats, and shortness of breath are also present. The patient indicates that she is experiencing specific fears or anxiety surrounding some situations.  The pattern of responses reveals that she is likely to display significant symptoms in the area of obsessional and/or compulsive thoughts and behaviors.  She is probably seen by others as being something of a perfectionist.  She is likely to be a fairly rigid individual who follows her personal guidelines for conduct in an inflexible and unyielding manner.  She ruminates about matters to the degree that she often has difficulty making decisions and perceiving the larger significance of decisions that are made.  Changes in routine, unexpected events, and contradictory information are likely to generate untoward stress.  She may fear her own impulses and doubt her ability to control them. The patient reports some difficulties consistent with relatively mild or transient depressive symptomatology.   The patient indicates that she is quite moody and emotionally labile, with the mood swings being fairly rapid and rather extreme.  In particular, she may experience episodes of poorly controlled anger, although other affects are also likely to be poorly controlled. According to the patient's self-report, she describes NO significant problems in the following areas: unusual thoughts or peculiar experiences; antisocial behavior; problems with empathy; undue suspiciousness or hostility; unusually elevated mood or heightened activity.   Also, she reports NO significant problems with alcohol or drug abuse or dependence.  With respect to suicidal ideation, the patient is NOT reporting distress from thoughts of self-harm.     Clinical Impressions: Adjustment disorder with depression and anxiety. Cognitive diagnosis deferred due to poor effort on testing.  The patient is reporting significant emotional distress in response to medical issues and associated stressors over the past year. She endorses significant anxiety and milder depression, which I would classify as an adjustment reaction. It is unclear if there is any pre-existing anxiety disorder (predating her stroke episodes).  Unfortunately, results of cognitive testing could not be interpreted due to evidence of suboptimal effort. This does not rule out the presence of cognitive symptoms, but I am unable to determine the nature or extent of any cognitive dysfunction that may exist.      Recommendations/Plan: Based on the findings of the present evaluation, the following recommendations are offered:   1. Treatment for anxiety/depression: Continued psychopharmacological treatment is indicated. She should follow up with a psychiatrist regularly to monitor effectiveness of medications. She may also benefit from individual counseling with a therapist. 2. Optimal control of vascular risk factors (ie, diabetes) is strongly encouraged in order to reduce the risk of future stroke and vascular related cognitive decline. She should regularly monitor blood sugars, follow dietary recommendations of her treating provider(s), and engage in safe cardiovascular exercise.  3. The patient is complaining of short term memory difficulties in her daily life, and she is therefore encouraged to use a daily log of events (and review regularly), take notes  on important information, make to-do lists, keep one (easily visible) calendar, and make written reminders/alarms for time sensitive information.        Feedback to Patient: Kelly Romero and her husband returned for a feedback appointment on 03/23/2016 to review the results of her neuropsychological evaluation with this provider. 30 minutes face-to-face time was spent reviewing her test results, my impressions and my recommendations as detailed above.      Total time spent on this patient's case: [id number] unit for interview with psychologist; [phone] units of testing by psychometrician under psychologist's supervision; [phone] units for medical record review, scoring of neuropsychological tests, interpretation of test results, preparation of this report, and review of results to the patient by psychologist.         Thank you for your referral of Kelly Romero. Please feel free to contact me if you have any questions or concerns regarding this report.

## 2016-03-25 ENCOUNTER — Telehealth: Payer: Self-pay | Admitting: Neurology

## 2016-03-25 ENCOUNTER — Other Ambulatory Visit: Payer: Self-pay

## 2016-03-25 DIAGNOSIS — I639 Cerebral infarction, unspecified: Secondary | ICD-10-CM

## 2016-03-25 NOTE — Telephone Encounter (Signed)
Spoke to patient and spouse. Both requesting Dr. Everlena CooperJaffe gives a VO stating it's ok for her to be out of work for 12 months due to her disability. Explained to patient and spouse standard disability process. Spouse stated he will have patient's caseworker contact our office.

## 2016-03-25 NOTE — Telephone Encounter (Signed)
PT called and has a question regarding test results/[name] CB# [phone]

## 2016-03-26 NOTE — Telephone Encounter (Addendum)
Spoke to patient, spouse, and caseworker on yesterday. Advised Dr. Everlena CooperJaffe will refer patient to have work evaluation. Called [facility]. Referral received. [facility] will review case and contact patient to schedule if case approved.

## 2016-03-27 ENCOUNTER — Encounter: Payer: Self-pay | Admitting: Physical Medicine & Rehabilitation

## 2016-04-09 ENCOUNTER — Telehealth: Payer: Self-pay | Admitting: *Deleted

## 2016-04-09 NOTE — Telephone Encounter (Signed)
Insurance person called stating that they would be faxing forms for us to sign in regards to patients disbility status.  Patient has a New Patient Eval for CVA 01/26/20918 with Dr. Wynn BankerKirsteins. Form is on [employer] desk...... FYI

## 2016-04-10 ENCOUNTER — Ambulatory Visit (HOSPITAL_BASED_OUTPATIENT_CLINIC_OR_DEPARTMENT_OTHER): Payer: 59 | Admitting: Physical Medicine & Rehabilitation

## 2016-04-10 ENCOUNTER — Encounter: Payer: Self-pay | Admitting: Physical Medicine & Rehabilitation

## 2016-04-10 ENCOUNTER — Encounter: Payer: 59 | Attending: Physical Medicine & Rehabilitation

## 2016-04-10 VITALS — BP 133/83 | HR 89

## 2016-04-10 DIAGNOSIS — I69320 Aphasia following cerebral infarction: Secondary | ICD-10-CM | POA: Insufficient documentation

## 2016-04-10 DIAGNOSIS — I639 Cerebral infarction, unspecified: Secondary | ICD-10-CM | POA: Insufficient documentation

## 2016-04-10 DIAGNOSIS — F0150 Vascular dementia without behavioral disturbance: Secondary | ICD-10-CM | POA: Insufficient documentation

## 2016-04-10 NOTE — Patient Instructions (Signed)
Will need Neuropsych eval  Speech eval

## 2016-04-10 NOTE — Progress Notes (Signed)
Subjective:    Patient ID: Kelly Romero, female    DOB: 05-02-1965, 51 y.o.   MRN: [id number]  HPI chief complaint is memory problems  51 year old female with history of multiple CVAs. The first stroke occurred 12/07/2014 and was admitted to [facility]. She has some agitation at that time. However, CT scan was negative. Urine tox screen was positive for opioids. Patient states she was not taking opioids at that time, however, wonders whether her family may have given her some.  There were elevated blood pressures, hemoglobin A 1 C, was elevated at 12, no fever. TIA was suspected. Had another strokelike symptom 02/22/2015 working up with a headache and slurred speech and went to [facility] regional MRI showed left occipital subacute infarct. Workup including Dopplers, 2-D echo, hypercoagulable panel was all negative. Plavix was added to aspirin TEE performed to 06/03/2015. Normal ejection fracture. No cardiac source of emboli  Admission to [facility] 08/15/2015 - 6/07-2015 for right-sided weakness as well as forgetfulness. No TPA was administered. Urine tox negative CTs head showed no acute findings. MRI showed small acute infarct posterior right MCA territory although "some question this may have been an old infarct EEG showed no epileptiform activity. TTE unremarkable. Some question whether there was some psychologic component to this.  Another admission for fever in October 2017 MRI showed small acute and subacute infarcts bilateral cerebral hemisphere. Urinalysis and tox screen negative, workup for endocarditis is negative, LP. It was stenosis at the PCA P1 segment, lower extremity distal DVT on the right not placed on chronic anticoagulation  Has Associate's degree in education  Pt on FMLA for work, wanting to be evaluated for return to work.THinks PCP Dr Jeanie Sewer signed FMLA Pt doesn't feel ready at this time.    Some difficulty with word finding  No PT, OT or SLP  Last fall  several months ago  ROS + Constipation, Emma toes tingle on both sides Pain Inventory Average Pain 7 Pain Right Now 6 My pain is intermittent and stabbing  In the last 24 hours, has pain interfered with the following? General activity 5 Relation with others 5 Enjoyment of life 6 What TIME of day is your pain at its worst? morning Sleep (in general) Poor  Pain is worse with: unsure Pain improves with: medication Relief from Meds: 5  Mobility walk without assistance ability to climb steps?  yes do you drive?  no transfers alone  Function employed # of hrs/week 0 I need assistance with the following:  dressing, bathing and household duties  Neuro/Psych weakness tremor trouble walking dizziness confusion depression anxiety  Prior Studies Any changes since last visit?  no Study Result   CLINICAL DATA:  Encephalopathy.  EXAM: MRI HEAD WITHOUT AND WITH CONTRAST  TECHNIQUE: Multiplanar, multiecho pulse sequences of the brain and surrounding structures were obtained without and with intravenous contrast.  CONTRAST:  20mL MULTIHANCE GADOBENATE DIMEGLUMINE 529 MG/ML IV SOLN  COMPARISON:  08/15/2015  FINDINGS: Brain: 1 cm and smaller areas of restricted diffusion in the bilateral cerebral hemispheres, periatrial white matter on the right and lateral/ posterior frontal cortex on the left. There are additionally more hazy areas of DWI hyperintensity around the atrium of the left lateral ventricle and in the subcortical white matter at the vertex (the right posterior frontal infarct was acute on MRI June 2017). There are 2 areas of cortically based enhancement that have a flat appearance on coronal acquisition, present along bilateral sylvian fissure and measuring up to  14 mm length. There is no malignancy history and these are best attributed to subacute infarcts in this setting. There are chronic small infarcts in the inferior left occipital cortex and right  corona radiata with hemosiderin staining. Recurrent small-vessel infarct may be related to embolic phenomenon or vasculitis.  No evidence of hemorrhage, hydrocephalus, or shift.  Vascular: Normal flow voids.  Skull and upper cervical spine: Negative  Sinuses/Orbits: No acute finding  Other: Motion degraded study which could obscure pathology.  IMPRESSION: 1. Small bilateral acute and subacute cerebral infarcts as described. 2. Motion degraded exam.   Electronically Signed   By: Marnee SpringJonathon  Watts M.D.   On: 12/27/2015 12:01    Physicians involved in your care Any changes since last visit?  no   Family History  Problem Relation Age of Onset  . Stroke Father     3250s  . Heart attack Father [id number]  . COPD Mother   . Heart failure Brother   . Cancer Maternal Grandmother     unknown   . COPD    . Heart failure Maternal Grandfather   . Hypertension Maternal Grandfather   . Cancer Paternal Grandfather     lung  . Diabetes Paternal Grandmother    Social History   Social History  . Marital status: Married    Spouse name: N/A  . Number of children: N/A  . Years of education: N/A   Occupational History  . Data processing managerAssistant Teacher    Social History Main Topics  . Smoking status: Never Smoker  . Smokeless tobacco: Never Used  . Alcohol use No     Comment: rare   . Drug use: No  . Sexual activity: No   Other Topics Concern  . Not on file   Social History Narrative  . No narrative on file   Past Surgical History:  Procedure Laterality Date  . APPENDECTOMY    . CHOLECYSTECTOMY    . FOOT GANGLION EXCISION    . HERNIA REPAIR    . IR GENERIC HISTORICAL  12/30/2015   IR ANGIO INTRA EXTRACRAN SEL COM CAROTID INNOMINATE BILAT MOD SED 12/30/2015 Julieanne CottonSanjeev Deveshwar, MD [facility]  . IR GENERIC HISTORICAL  12/30/2015   IR ANGIO VERTEBRAL SEL VERTEBRAL BILAT MOD SED 12/30/2015 Julieanne CottonSanjeev Deveshwar, MD [facility]  . TEE WITHOUT CARDIOVERSION N/A 04/19/2015   Procedure:  TRANSESOPHAGEAL ECHOCARDIOGRAM (TEE);  Surgeon: Jake BatheMark C Skains, MD;  [facility];  Service: Cardiovascular;  Laterality: N/A;  . VAGINAL HYSTERECTOMY     Past Medical History:  Diagnosis Date  . Diabetes mellitus without complication (HCC)   . Headache   . Hyperlipidemia   . Hypertension   . TIA (transient ischemic attack)    There were no vitals taken for this visit.  Opioid Risk Score:   Fall Risk Score:  `1  Depression screen PHQ 2/9  No flowsheet data found. Review of Systems  HENT: Negative.   Eyes: Negative.   Respiratory: Negative.   Cardiovascular: Negative.   Gastrointestinal: Negative.   Endocrine: Negative.   Genitourinary: Negative.   Musculoskeletal: Positive for gait problem.  Skin: Negative.   Neurological: Positive for dizziness, tremors, weakness, numbness and headaches.  Psychiatric/Behavioral: Positive for confusion and dysphoric mood. The patient is nervous/anxious.   All other systems reviewed and are negative.      Objective:   Physical Exam  Constitutional: She is oriented to person, place, and time. She appears well-developed and well-nourished.  HENT:  Head: Normocephalic and atraumatic.  Eyes: Conjunctivae and EOM are normal. Pupils are equal, round, and reactive to light.  Neck: Normal range of motion.  Cardiovascular: Normal rate, regular rhythm and normal heart sounds.  Exam reveals no friction rub.   No murmur heard. Pulmonary/Chest: Effort normal and breath sounds normal. No respiratory distress. She has no wheezes.  Abdominal: Soft. Bowel sounds are normal. She exhibits no distension. There is no tenderness.  Neurological: She is alert and oriented to person, place, and time.  Skin: Skin is warm and dry.  Psychiatric: She has a normal mood and affect. Her behavior is normal. Judgment and thought content normal.  Nursing note and vitals reviewed. " Friday Apr 06, 2016" Office in [place]  not oriented to county , lives  in [place] Difficulty repeating no if, and or buts about about Difficulty repeating [employer] unable to do serial sevens Spells WORLD fwd and bwd Her strength is 4/5, bilateral deltoid, biceps, and grip, hip flexor, knee extensor, ankle dorsi flexor. Deep tendon reflexes 1 plus bilateral biceps, 0 triceps, 0 patellar, 0 Achilles. Sensation is intact to light touch and pinprick bilateral upper and lower limbs          Assessment & Plan:  1. Cognitive deficits post stroke. She does have evidence of small multi-infarcts on imaging, with decreased memory and attention/concentration on examination.  I think she does have a vascular dementia- albeit relatively mild Would like her to get neuropsychologic testing to further delineate. 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 Her last stroke was approximately 3 months ago.  2. Aphasia post stroke. She does have evidence of expressive deficits, question she may have some receptive deficits that may impede information storage. We will refer to speech therapy for evaluation. She may need some ongoing speech therapy to treat both language deficits as well as cognition.  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

## 2016-05-08 ENCOUNTER — Encounter: Payer: Self-pay | Admitting: Physical Medicine & Rehabilitation

## 2016-05-08 ENCOUNTER — Encounter: Payer: 59 | Attending: Physical Medicine & Rehabilitation

## 2016-05-08 ENCOUNTER — Ambulatory Visit (HOSPITAL_BASED_OUTPATIENT_CLINIC_OR_DEPARTMENT_OTHER): Payer: 59 | Admitting: Physical Medicine & Rehabilitation

## 2016-05-08 VITALS — BP 125/74 | HR 92 | Resp 14

## 2016-05-08 DIAGNOSIS — I639 Cerebral infarction, unspecified: Secondary | ICD-10-CM

## 2016-05-08 DIAGNOSIS — I69320 Aphasia following cerebral infarction: Secondary | ICD-10-CM

## 2016-05-08 DIAGNOSIS — IMO0002 Reserved for concepts with insufficient information to code with codable children: Secondary | ICD-10-CM

## 2016-05-08 DIAGNOSIS — R4189 Other symptoms and signs involving cognitive functions and awareness: Secondary | ICD-10-CM

## 2016-05-08 NOTE — Progress Notes (Signed)
Subjective:    Patient ID: Kelly Romero, female    DOB: [birth year], 51 y.o.   MRN: [id number] 51 year old female with history of multiple CVAs. The first stroke occurred 12/07/2014 and was admitted to [facility]. She has some agitation at that time. However, CT scan was negative. Urine tox screen was positive for opioids. Patient states she was not taking opioids at that time, however, wonders whether her family may have given her some.  There were elevated blood pressures, hemoglobin A 1 C, was elevated at 12, no fever. TIA was suspected. Had another strokelike symptom 02/22/2015 working up with a headache and slurred speech and went to [facility] regional MRI showed left occipital subacute infarct. Workup including Dopplers, 2-D echo, hypercoagulable panel was all negative. Plavix was added to aspirin TEE performed to 06/03/2015. Normal ejection fracture. No cardiac source of emboli  Admission to [facility] 08/15/2015 - 6/07-2015 for right-sided weakness as well as forgetfulness. No TPA was administered. Urine tox negative CTs head showed no acute findings. MRI showed small acute infarct posterior right MCA territory although "some question this may have been an old infarct EEG showed no epileptiform activity. TTE unremarkable. Some question whether there was some psychologic component to this.  Another admission for fever in October 2017 MRI showed small acute and subacute infarcts bilateral cerebral hemisphere. Urinalysis and tox screen negative, workup for endocarditis is negative, LP. It was stenosis at the PCA P1 segment, lower extremity distal DVT on the right not placed on chronic anticoagulation  Has Associate's degree in education  Pt on FMLA for work, wanting to be evaluated for return to work.THinks PCP Dr Jeanie Sewer signed FMLA   HPI Still having problem thinking of people's names Needs repetition for instructions Last day of work was ? Nov 2017, employer asked her to go  on FMLA Forgot names of students (Architectural technologist)  Interval hx + for cataract removal.  R side 2/12, Left side 2/19 Hx of diabetic eye problems per pt ? Retinopathy, still has blurred vision post op   Pain Inventory Average Pain 4 Pain Right Now 3 My pain is sharp  In the last 24 hours, has pain interfered with the following? General activity 2 Relation with others 0 Enjoyment of life 10 What TIME of day is your pain at its worst? all Sleep (in general) Fair  Pain is worse with: some activites Pain improves with: medication Relief from Meds: 2  Mobility walk without assistance how many minutes can you walk? 15 ability to climb steps?  no do you drive?  no Do you have any goals in this area?  no  Function disabled: date disabled .  Neuro/Psych weakness numbness tremor trouble walking dizziness confusion anxiety  Prior Studies Any changes since last visit?  no  Physicians involved in your care Any changes since last visit?  no   Family History  Problem Relation Age of Onset  . Stroke Father     5s  . Heart attack Father 40  . COPD Mother   . Heart failure Brother   . Cancer Maternal Grandmother     unknown   . COPD    . Heart failure Maternal Grandfather   . Hypertension Maternal Grandfather   . Cancer Paternal Grandfather     lung  . Diabetes Paternal Grandmother    Social History   Social History  . Marital status: Married    Spouse name: N/A  . Number of children: N/A  .  Years of education: N/A   Occupational History  . Data processing managerAssistant Teacher    Social History Main Topics  . Smoking status: Never Smoker  . Smokeless tobacco: Never Used  . Alcohol use No     Comment: rare   . Drug use: No  . Sexual activity: No   Other Topics Concern  . None   Social History Narrative  . None   Past Surgical History:  Procedure Laterality Date  . APPENDECTOMY    . CHOLECYSTECTOMY    . FOOT GANGLION EXCISION    . HERNIA REPAIR    . IR  GENERIC HISTORICAL  12/30/2015   IR ANGIO INTRA EXTRACRAN SEL COM CAROTID INNOMINATE BILAT MOD SED 12/30/2015 Julieanne CottonSanjeev Deveshwar, MD [facility]  . IR GENERIC HISTORICAL  12/30/2015   IR ANGIO VERTEBRAL SEL VERTEBRAL BILAT MOD SED 12/30/2015 Julieanne CottonSanjeev Deveshwar, MD [facility]  . TEE WITHOUT CARDIOVERSION N/A 04/19/2015   Procedure: TRANSESOPHAGEAL ECHOCARDIOGRAM (TEE);  Surgeon: Jake BatheMark C Skains, MD;  [facility];  Service: Cardiovascular;  Laterality: N/A;  . VAGINAL HYSTERECTOMY     Past Medical History:  Diagnosis Date  . Diabetes mellitus without complication (HCC)   . Headache   . Hyperlipidemia   . Hypertension   . TIA (transient ischemic attack)    BP 125/74 (BP Location: Left Arm, Patient Position: Sitting, Cuff Size: Large)   Pulse 92   Resp 14   SpO2 98%   Opioid Risk Score:   Fall Risk Score:  `1  Depression screen PHQ 2/9  Depression screen PHQ 2/9 04/10/2016  Decreased Interest 2  Down, Depressed, Hopeless 2  PHQ - 2 Score 4  Altered sleeping 3  Tired, decreased energy 3  Change in appetite 0  Feeling bad or failure about yourself  1  Trouble concentrating 3  Moving slowly or fidgety/restless 3  Suicidal thoughts 1  PHQ-9 Score 18  Difficult doing work/chores Extremely dIfficult     Review of Systems  Constitutional: Positive for chills, fever and unexpected weight change.  HENT: Negative.   Respiratory: Negative.   Cardiovascular: Negative.   Gastrointestinal: Negative.   Endocrine:       High blood sugar  Genitourinary: Negative.   Musculoskeletal: Positive for gait problem.  Skin: Negative.   Allergic/Immunologic: Negative.   Neurological: Positive for dizziness, tremors, speech difficulty, weakness, numbness and headaches.  Hematological: Negative.   Psychiatric/Behavioral: Positive for confusion. The patient is nervous/anxious.        Objective:   Physical Exam  Constitutional: She appears well-developed and well-nourished.  HENT:   Head: Normocephalic and atraumatic.  Eyes: Conjunctivae and EOM are normal. Pupils are equal, round, and reactive to light.  Neck: Normal range of motion.  Psychiatric: She has a normal mood and affect. Thought content normal. Her speech is delayed. She is slowed. She is not agitated and not aggressive.  Remembers dinner from last night  Orient to person place, year, month day not date Difficulty recalling dates of recent eye visual fields are intact to confrontation testing. Structural or muscles intact. Eyes are not injected.  Remembers 1/3 objects after delay Remembers chair with cue but not ball  Spells WORLD fwd And backwards but with delay     She is attentive.  Nursing note and vitals reviewed.  Motor strength is 5/5 bilateral deltoid, bicep, triceps, grip, hip flexor, knee extensor, ankle dorsal flexor. Sensation intact bilateral upper and lower limbs  Gait without evidence of toe drag or knee instability does  not require any assisted device. Romberg is negative     Assessment & Plan:  1. History of multiple CVAs with residual cognitive deficits and aphasia Currently unable to work. He was working up until the last CVAs, which were bilateral cerebral infarcts, October 2017. Had attempted to go back to work but could not remember names of students.  During initial visit, I recommended both neuropsychology evaluation and speech evaluation. Patient has not had either. I have made another referral to neuropsychology and have spoken to our office to schedule with the neuropsychologist in our office. Patient lives in [place] and would like to see a speech therapist closer to home. I have indicated that she needs to call [facility] therapy outpatient department. I have written prescription for this. Physical medicine and rehabilitation follow-up in 6 weeks

## 2016-05-08 NOTE — Patient Instructions (Signed)
Neuropsychology evaluation. We'll look at memory, attention and concentration Dr Kieth Brightlyodenbough, will need appt at this office  [facility] therapy. Please asked them to send reports to me

## 2016-06-18 ENCOUNTER — Ambulatory Visit: Payer: 59 | Admitting: Neurology

## 2016-06-19 ENCOUNTER — Ambulatory Visit: Payer: 59 | Admitting: Physical Medicine & Rehabilitation

## 2016-06-23 ENCOUNTER — Ambulatory Visit (HOSPITAL_BASED_OUTPATIENT_CLINIC_OR_DEPARTMENT_OTHER): Payer: 59 | Admitting: Physical Medicine & Rehabilitation

## 2016-06-23 ENCOUNTER — Other Ambulatory Visit: Payer: Self-pay | Admitting: Physical Medicine & Rehabilitation

## 2016-06-23 ENCOUNTER — Ambulatory Visit: Payer: 59 | Admitting: Neurology

## 2016-06-23 ENCOUNTER — Encounter: Payer: 59 | Attending: Physical Medicine & Rehabilitation

## 2016-06-23 ENCOUNTER — Encounter: Payer: Self-pay | Admitting: Physical Medicine & Rehabilitation

## 2016-06-23 ENCOUNTER — Ambulatory Visit
Admission: RE | Admit: 2016-06-23 | Discharge: 2016-06-23 | Disposition: A | Discharge status: Home / Self Care | Payer: 59 | Source: Ambulatory Visit | Attending: Physical Medicine & Rehabilitation | Admitting: Physical Medicine & Rehabilitation

## 2016-06-23 VITALS — BP 155/79 | HR 83

## 2016-06-23 DIAGNOSIS — I69320 Aphasia following cerebral infarction: Secondary | ICD-10-CM | POA: Diagnosis not present

## 2016-06-23 DIAGNOSIS — I639 Cerebral infarction, unspecified: Secondary | ICD-10-CM

## 2016-06-23 DIAGNOSIS — M25511 Pain in right shoulder: Secondary | ICD-10-CM | POA: Diagnosis not present

## 2016-06-23 DIAGNOSIS — R4189 Other symptoms and signs involving cognitive functions and awareness: Secondary | ICD-10-CM

## 2016-06-23 DIAGNOSIS — IMO0002 Reserved for concepts with insufficient information to code with codable children: Secondary | ICD-10-CM

## 2016-06-23 DIAGNOSIS — G8929 Other chronic pain: Secondary | ICD-10-CM

## 2016-06-23 DIAGNOSIS — M79671 Pain in right foot: Secondary | ICD-10-CM

## 2016-06-23 NOTE — Progress Notes (Signed)
Subjective:    Patient ID: Kelly Romero, female    DOB: 1965-05-18, 51 y.o.   MRN: [id number]  HPI Chief complaint right foot pain and swelling  Right sided headache-This is not severe and not her major complaint today. It has been a chronic issue for her. Right sided low back , UA done by PCP  Right foot swelling, hurt her Right little toe while walking at night about 5 days ago.  Foot swollen since that time, has not sought medical care at this point, does not think it is getting much better, although the bruising has diminished  Has appt with [facility] but doesn't know why  Going to speech therapy, has appt in May with Dr Everlena Cooper from neurology   Pain Inventory Average Pain 6 Pain Right Now 4 My pain is .  In the last 24 hours, has pain interfered with the following? General activity 4 Relation with others 3 Enjoyment of life 3 What TIME of day is your pain at its worst? all Sleep (in general) Poor  Pain is worse with: . Pain improves with: medication Relief from Meds: 3  Mobility ability to climb steps?  no do you drive?  no  Function Do you have any goals in this area?  no  Neuro/Psych weakness tremor confusion anxiety  Prior Studies Any changes since last visit?  no  Physicians involved in your care Any changes since last visit?  no   Family History  Problem Relation Age of Onset  . Stroke Father     31s  . Heart attack Father 75  . COPD Mother   . Heart failure Brother   . Cancer Maternal Grandmother     unknown   . COPD    . Heart failure Maternal Grandfather   . Hypertension Maternal Grandfather   . Cancer Paternal Grandfather     lung  . Diabetes Paternal Grandmother    Social History   Social History  . Marital status: Married    Spouse name: N/A  . Number of children: N/A  . Years of education: N/A   Occupational History  . Data processing manager    Social History Main Topics  . Smoking status: Never Smoker  .  Smokeless tobacco: Never Used  . Alcohol use No     Comment: rare   . Drug use: No  . Sexual activity: No   Other Topics Concern  . Not on file   Social History Narrative  . No narrative on file   Past Surgical History:  Procedure Laterality Date  . APPENDECTOMY    . CHOLECYSTECTOMY    . FOOT GANGLION EXCISION    . HERNIA REPAIR    . IR GENERIC HISTORICAL  12/30/2015   IR ANGIO INTRA EXTRACRAN SEL COM CAROTID INNOMINATE BILAT MOD SED 12/30/2015 Julieanne Cotton, MD [facility]  . IR GENERIC HISTORICAL  12/30/2015   IR ANGIO VERTEBRAL SEL VERTEBRAL BILAT MOD SED 12/30/2015 Julieanne Cotton, MD [facility]  . TEE WITHOUT CARDIOVERSION N/A 04/19/2015   Procedure: TRANSESOPHAGEAL ECHOCARDIOGRAM (TEE);  Surgeon: Jake Bathe, MD;  Location: [facility] ENDOSCOPY;  Service: Cardiovascular;  Laterality: N/A;  . VAGINAL HYSTERECTOMY     Past Medical History:  Diagnosis Date  . Diabetes mellitus without complication (HCC)   . Headache   . Hyperlipidemia   . Hypertension   . TIA (transient ischemic attack)    There were no vitals taken for this visit.  Opioid Risk Score:  Fall Risk Score:  `1  Depression screen PHQ 2/9  Depression screen PHQ 2/9 04/10/2016  Decreased Interest 2  Down, Depressed, Hopeless 2  PHQ - 2 Score 4  Altered sleeping 3  Tired, decreased energy 3  Change in appetite 0  Feeling bad or failure about yourself  1  Trouble concentrating 3  Moving slowly or fidgety/restless 3  Suicidal thoughts 1  PHQ-9 Score 18  Difficult doing work/chores Extremely dIfficult    Review of Systems  Constitutional: Negative.   HENT: Negative.   Eyes: Negative.   Respiratory: Negative.   Cardiovascular: Negative.   Gastrointestinal: Positive for nausea and vomiting.  Endocrine: Negative.   Genitourinary: Positive for dysuria.  Musculoskeletal: Negative.   Skin: Negative.   Allergic/Immunologic: Negative.   Neurological: Negative.   Hematological: Negative.     Psychiatric/Behavioral: Negative.   All other systems reviewed and are negative.      Objective:   Physical Exam  Right foot with tenderness at the base of the third and fourth toes. There is mild ecchymosis mainly on the fourth toe. There is pain with metacarpal compression at the same third and fourth toe area. No pain to palpation over the dorsum of the foot. No pain with ankle range of motion great toe range of motion is pain free. No evidence of joint swelling.  Right shoulder has tenderness over the acromioclavicular joint. There is pain with external rotation, forward flexion and abduction mildly limited range of motion. There is no evidence of subluxation. Motor strength is 5/5 bilateral deltoid, biceps, triceps, grip, although full effort is not given on the right deltoid due to pain.  There is tenderness along the right lumbar paraspinal muscles going from L4-S1. There is pain in the iliac crest area on the right side. There is also pain over the right lateral rib area around T10-T11.      Assessment & Plan:  1. 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 Right foot pain- mainly at the base of the third and fourth toes, given recent trauma. We'll check x-rays. If positive for fracture,  we'll need a cast shoe  3. Headaches related CVA, follow-up with neurology  4. Right shoulder pain, chronic. Since first stroke in 2016. She does have some exam findings consistent with acromioclavicular pain as well as adhesive capsulitis. We'll check x-rays. Mom may benefit from acromioclavicular or glenohumeral injection under ultrasound guidance.

## 2016-06-23 NOTE — Patient Instructions (Signed)

## 2016-07-14 ENCOUNTER — Ambulatory Visit (INDEPENDENT_AMBULATORY_CARE_PROVIDER_SITE_OTHER): Payer: 59 | Admitting: Neurology

## 2016-07-14 ENCOUNTER — Encounter: Payer: Self-pay | Admitting: Neurology

## 2016-07-14 VITALS — BP 124/60 | HR 95 | Ht 64.25 in | Wt 229.0 lb

## 2016-07-14 DIAGNOSIS — I82409 Acute embolism and thrombosis of unspecified deep veins of unspecified lower extremity: Secondary | ICD-10-CM

## 2016-07-14 DIAGNOSIS — G43009 Migraine without aura, not intractable, without status migrainosus: Secondary | ICD-10-CM

## 2016-07-14 DIAGNOSIS — G3184 Mild cognitive impairment, so stated: Secondary | ICD-10-CM

## 2016-07-14 DIAGNOSIS — I639 Cerebral infarction, unspecified: Secondary | ICD-10-CM | POA: Diagnosis not present

## 2016-07-14 DIAGNOSIS — E785 Hyperlipidemia, unspecified: Secondary | ICD-10-CM

## 2016-07-14 DIAGNOSIS — I10 Essential (primary) hypertension: Secondary | ICD-10-CM

## 2016-07-14 DIAGNOSIS — F4323 Adjustment disorder with mixed anxiety and depressed mood: Secondary | ICD-10-CM

## 2016-07-14 DIAGNOSIS — I69320 Aphasia following cerebral infarction: Secondary | ICD-10-CM | POA: Diagnosis not present

## 2016-07-14 NOTE — Progress Notes (Signed)
Done

## 2016-07-14 NOTE — Patient Instructions (Signed)
1.  Continue Plavix  daily and aspirin  daily 2.  Continue depakote  twice daily 3.  Stop ibuprofen.  When you get a headache, take Excedrin Tension (or acetaminophen-caffeine combination pill).  Contact me if it is not effective.  Limit to no more than 2 days out of the week. 4.  We will schedule you again for neurocognitive testing 5.  Continue blood pressure and cholesterol medication 6.  Stop amitriptyline 7.  Follow up in 6 months.

## 2016-07-14 NOTE — Progress Notes (Addendum)
NEUROLOGY FOLLOW UP OFFICE NOTE  Kelly Romero [id number]  HISTORY OF PRESENT ILLNESS: Kelly Romero is a 51 year old right-handed female with history of uncontrolled diabetes, hypertension and depression who follows up for headache and stroke.  History obtained by patient and hospital notes from [facility].  Imaging of brain CT and MRI from 08/15/15 personally reviewed.   UPDATE: Headache: Improved but they last all day. Intensity:  6-7/10 Duration:  All day Frequency:  Once a week to once every other week Current NSAIDS:  Ibuprofen  Current analgesics:  no Current triptans:  no Current anti-emetic:  no Current muscle relaxants:  baclofen  Current anti-anxiolytic:  no Current sleep aide:  no Current Antihypertensive medications:  lisinopril Current Antidepressant medications:  amitriptyline , escitalopram  Current Anticonvulsant medications:  divalproex DR  twice daily Current Vitamins/Herbal/Supplements:  no Current Antihistamines/Decongestants:  no Other therapy:  no   Stroke: 30 day Holter monitor from late 2017 revealed no arrhythmia. She is on ASA  and Plavix for secondary stroke prevention.  She still feels dizzy.  She was found to have right lower extremity DVT in October, which was being monitored by Dr. Jeanie Sewer .   Cognitive impairment: She underwent neuropsychological testing on 03/10/16.  She exhibited adjustment disorder with depression and anxiety.  However, a cognitive diagnosis could not be made due to poor effort on testing.  It was recommended to continue treating anxiety and depression.  She is been receiving rehab from Dr. Doroteo Bradford and speech therapy for her aphasia.  She has noted improvement and speech therapy was finished last week.    She believes her depression is under better control.  She takes escitalopram and amitriptyine.   HISTORY: I  Headaches: Onset:  July 2016.  Sudden onset. Location:  Right sided, from front  to back of head.  Initially had pain behind right ear. Quality:  squeezing Initial intensity:  7-8/10 Aura:  no Prodrome:  no Associated symptoms:  Some photophobia and phonophobia.  Sometimes nausea.  She denies visual disturbance. Initial Duration:  All day Initial Frequency:  Daily.  Sometimes wakes her up at night Triggers/exacerbating factors:  Loud noise Relieving factors:  Laying in a cool dark quiet room   Past abortive medication:  Tylenol, Excedrin, Percocet Past preventative medication:  none Other past therapy:  none   Current abortive medication:  Ibuprofen  Antihypertensive medications:  Lisinopril, metoprolol Antidepressant medications:  lexapro  Anticonvulsant medications:  none Vitamins/Herbal/Supplements:  none Other therapy:  none   Sed Rate was 30.  MRI of the brain from 11/09/14 showed chronic small vessel ischemic changes with an incidental tiny subacute infarct in the right frontal lobe.     II  Encephalopathy/Stroke: She was in [facility] on 12/07/14 for stroke symptoms.  She developed slurred speech and right arm numbness.   She was agitated, yelling and screaming in the ED.  She was given Ativan and Haldol.  CT of head was unremarkable.  Repeat CT of head two days later was negative.  CXR negative.  Urine tox screen was positive for opioids.  She was found to have severally elevated blood pressure with systolic at 240 and higher.  Hgb A1c was 12 with serum glucose level of 400.  She did not have fever or leukocytosis, and UA was borderline with trace leukocytes but negative nitrite.  She was treated with 5 days of IV Rocephin.  Troponins were negative.  Etiology was unknown but TIA was suspected as a  possibility.   She had a similar stroke-like episode on 02/22/15.  She woke up with headache and later developed slurred speech.  She was admitted to [facility], where MRI of brain revealed 2 mm left occipital subacute infarct.  2D echo was  performed, which appeared unremarkable, but didn't make mention of a PFO.  Carotid doppler again showed no hemodynamically significant ICA stenosis.  Hypercoagulable panel, ANA, lupus panel, antiextractable nuclear antigen were negative.  LDL was 130.  Plavix was added to her ASA.  Crestor was increased to  daily.  Labs from August 2016 include Sed Rate 30, ANA negative, ANCA screen negative, antiextractable nuclear antigen (RNP abs, Smith abs) negative.  She had a TEE on 04/19/15, which revealed EF 60-65% with no PFO or atrial septal defect or other cardiac source of emboli.  After 3 months on dual antiplatelet therapy, she was continued on Plavix alone.  She was on amitriptyline  at bedtime to help with depression and headache.   She was admitted to [facility] between 08/15/15 and 08/19/15 for right-sided (however some notes state left-sided) weakness and numbness associated with forgetfulness and agitation.  Blood pressure was elevated.  NIHSS was 4.  Due to minimal symptoms, she did not receive tPA.  Urine tox screen was negative.  CT of head showed no acute findings.  MRI of brain with and without contrast revealed possible small acute infarct in the posterior right MCA territory.  However, it appears on previous imaging, which suggests it is likely an old finding.  Neurology believed it to be an old infarct, as it apparently was seen on a prior MRI in February 2017, which I do not have.  TTE again was unremarkable with EF of 65-70%.  LDL was 231.  Hgb A1c was 10.5.  Exact cause of encephalopathy was unclear.  She underwent an EEG on 08/18/15, which revealed mild to moderate generalized background slowing but no epileptiform activity.  Neurology felt symptoms may be psychogenic.  She was started on Depakote  twice daily for possible seizures.    She was maintained on ASA  and Plavix, as well as Rosuvastatin .  She reports word-finding difficulties, which has been ongoing for several months.     She was readmitted to [facility] from 12/26/15 to 12/22/2015 for altered mental status, fever of 103 and elevated blood pressure with systolic in 220s.  MRI of brain was personally reviewed and revealed small acute and subacute infarcts in the bilateral cerebral hemisphere.  Due to fever, she was started on IV antibiotics and acyclovir.  UA and urine tox screen was negative.  Blood cultures showed 1 of 2 positive for gram +, likely contaminated.  Endocarditis was not suspected.  She underwent LP.  CSF cell count was 1, gram stain negative, protein 49, glucose 98, oligoclonal bands negative, fungal cultures, CMV, VZV, VDRL and HSV were negative.  In the serum, HIV and RPR were nonreactive.  Arbovirus panel was negative.  ACE was 26.  Hypercoagulable panel (including homocysteine, lupus anticoagulant, beta-2-glycoprotein, cardiolipin antibodies) was negative.  TTE showed normal LVEF 55-60% and TCD showed clinically insignificant right to left shunt with valsalva only.  Cerebral angiogram was performed, which revealed mild athersclerosis with 65-70% stenosis of left MCA superior division, severe stenosis of proximal PCA P1 segment and tapered stenosis of distal right pericallosal artery.  Hgb A1c was 13.  LDL was 153.  Lower extremity doppler revealed distal DVT in the right lower extremity.  Plavix was  switched to ASA  daily.  DVT is being monitored and managed by her PCP.  Recommendation was to recheck venous ultrasound every 2 weeks and if there is any proximal extension, then the switch to anticoagulation.  PAST MEDICAL HISTORY: Past Medical History:  Diagnosis Date  . Diabetes mellitus without complication (HCC)   . Headache   . Hyperlipidemia   . Hypertension   . TIA (transient ischemic attack)     MEDICATIONS: Current Outpatient Prescriptions on File Prior to Visit  Medication Sig Dispense Refill  . aspirin EC 325 MG EC tablet Take 1 tablet (325 mg total) by mouth daily. 30 tablet 1  .  clopidogrel (PLAVIX) 75 MG tablet Take 1 tablet (75 mg total) by mouth daily. 30 tablet 11  . divalproex (DEPAKOTE) 500 MG DR tablet Take 1 tablet (500 mg total) by mouth 2 (two) times daily. 60 tablet 3  . Empagliflozin-Linagliptin (GLYXAMBI) 25-5 MG TABS Take 25 mg by mouth.    . escitalopram (LEXAPRO) 10 MG tablet Take 1 tablet (10 mg total) by mouth daily. 30 tablet 3  . ibuprofen (ADVIL,MOTRIN) 800 MG tablet Take 1 tablet (800 mg total) by mouth every 8 (eight) hours as needed. 30 tablet 2  . insulin detemir (LEVEMIR) 100 UNIT/ML injection Inject 85 Units into the skin every morning.     Marland Kitchen lisinopril (PRINIVIL,ZESTRIL) 30 MG tablet Take 1 tablet (30 mg total) by mouth daily. 30 tablet 3  . rosuvastatin (CRESTOR) 40 MG tablet Take 1 tablet (40 mg total) by mouth at bedtime. 30 tablet 3   No current facility-administered medications on file prior to visit.     ALLERGIES: Allergies  Allergen Reactions  . Erythromycin Itching  . Latex     itch    FAMILY HISTORY: Family History  Problem Relation Age of Onset  . Stroke Father     48s  . Heart attack Father 54  . COPD Mother   . Heart failure Brother   . Cancer Maternal Grandmother     unknown   . COPD    . Heart failure Maternal Grandfather   . Hypertension Maternal Grandfather   . Cancer Paternal Grandfather     lung  . Diabetes Paternal Grandmother     SOCIAL HISTORY: Social History   Social History  . Marital status: Married    Spouse name: N/A  . Number of children: N/A  . Years of education: N/A   Occupational History  . Data processing manager    Social History Main Topics  . Smoking status: Never Smoker  . Smokeless tobacco: Never Used  . Alcohol use No     Comment: rare   . Drug use: No  . Sexual activity: No   Other Topics Concern  . Not on file   Social History Narrative  . No narrative on file    REVIEW OF SYSTEMS: Constitutional: No fevers, chills, or sweats, no generalized fatigue, change in  appetite Eyes: No visual changes, double vision, eye pain Ear, nose and throat: No hearing loss, ear pain, nasal congestion, sore throat Cardiovascular: No chest pain, palpitations Respiratory:  No shortness of breath at rest or with exertion, wheezes GastrointestinaI: No nausea, vomiting, diarrhea, abdominal pain, fecal incontinence Genitourinary:  No dysuria, urinary retention or frequency Musculoskeletal:  No neck pain, back pain Integumentary: No rash, pruritus, skin lesions Neurological: as above Psychiatric: No depression, insomnia, anxiety Endocrine: No palpitations, fatigue, diaphoresis, mood swings, change in appetite, change in weight, increased thirst Hematologic/Lymphatic:  No purpura, petechiae. Allergic/Immunologic: no itchy/runny eyes, nasal congestion, recent allergic reactions, rashes  PHYSICAL EXAM: Vitals:   07/14/16 0725  BP: 124/60  Pulse: 95   General: No acute distress.  Patient appears well-groomed.   Head:  Normocephalic/atraumatic Eyes:  Fundi examined but not visualized Neck: supple, no paraspinal tenderness, full range of motion Heart:  Regular rate and rhythm Lungs:  Clear to auscultation bilaterally Back: No paraspinal tenderness Neurological Exam: alert and oriented to person, place, and time. Attention span and concentration intact, delayed recall 1 of 3 words after 5 minutes and remote memory intact, fund of knowledge intact.  Speech slow but fluent.  Naming fluency slow but improved.  Able to name and repeat.  No dysarthria.  CN II-XII intact. Bulk and tone normal, muscle strength 5/5 throughout.  Sensation to light touch  intact.  Deep tendon reflexes 2+ throughout.  Finger to nose testing intact.  Gait normal, Romberg negative.  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 Migraine Hypertension Hyperlipidemia Depression and anxiety DVT, monitored by PCP.  PLAN: 1.  Continue  ASA  and Plavix  daily for secondary stroke prevention. 2.  Continue statin therapy.  LDL goal less than 70.  Being managed by PCP.  Will check recent labs from PCP. 3.  Continue Depakote ER  twice daily for headache preventative.  We will discontinue amitriptyline  at bedtime. 4.  Stop ibuprofen and instead try Excedrin Tension.  If not effective, will try naproxen. If not effective, would try Ultracet. 5.  Continue blood pressure contorl 6.  Continue Lexapro for depression.  Doing better.  Will repeat neuropsychological testing at this point. 7.  Will check to see if lower extremity dopplers were performed by PCP as patient does not remember. 8.  Obtain recent labs from PCP. 9.  Follow up in 6 months.  25 minutes spent face to face with patient, over 50% spent discussing diagnosis and management.  Shon Millet, DO  CC: Gwendlyn Deutscher II, MD  ADDENDUM:  Labs from 06/11/16:  LDL 124, HGB A1c 7.7, B12 161, TSH 2.070

## 2016-07-23 ENCOUNTER — Ambulatory Visit: Payer: 59 | Admitting: Physical Medicine & Rehabilitation

## 2016-07-28 ENCOUNTER — Ambulatory Visit (HOSPITAL_BASED_OUTPATIENT_CLINIC_OR_DEPARTMENT_OTHER): Payer: 59 | Admitting: Physical Medicine & Rehabilitation

## 2016-07-28 ENCOUNTER — Encounter: Payer: 59 | Attending: Physical Medicine & Rehabilitation

## 2016-07-28 VITALS — BP 139/85 | HR 82 | Resp 14

## 2016-07-28 DIAGNOSIS — F0150 Vascular dementia without behavioral disturbance: Secondary | ICD-10-CM | POA: Diagnosis not present

## 2016-07-28 DIAGNOSIS — I639 Cerebral infarction, unspecified: Secondary | ICD-10-CM | POA: Insufficient documentation

## 2016-07-28 DIAGNOSIS — I69320 Aphasia following cerebral infarction: Secondary | ICD-10-CM

## 2016-07-28 NOTE — Progress Notes (Signed)
Subjective:    Patient ID: Kelly Romero, female    DOB: 18-Sep-1965, 51 y.o.   MRN: [id number] 51 year old female with history of multiple CVAs. The first stroke occurred 12/07/2014 and was admitted to [facility]. She has some agitation at that time. However, CT scan was negative. Urine tox screen was positive for opioids. Patient states she was not taking opioids at that time, however, wonders whether her family may have given her some.  There were elevated blood pressures, hemoglobin A 1 C, was elevated at 12, no fever. TIA was suspected. Had another strokelike symptom 02/22/2015 working up with a headache and slurred speech and went to [facility] regional MRI showed left occipital subacute infarct. Workup including Dopplers, 2-D echo, hypercoagulable panel was all negative. Plavix was added to aspirin TEE performed to 06/03/2015. Normal ejection fracture. No cardiac source of emboli  Admission to [facility] 08/15/2015 - 6/07-2015 for right-sided weakness as well as forgetfulness. No TPA was administered. Urine tox negative CTs head showed no acute findings. MRI showed small acute infarct posterior right MCA territory although "some question this may have been an old infarct EEG showed no epileptiform activity. TTE unremarkable. Some question whether there was some psychologic component to this.  Another admission for fever in October 2017 MRI showed small acute and subacute infarcts bilateral cerebral hemisphere. Urinalysis and tox screen negative, workup for endocarditis is negative, LP. It was stenosis at the PCA P1 segment, lower extremity distal DVT on the right not placed on chronic anticoagulation  Has Associate's degree in education  Pt on FMLA for work, wanting to be evaluated for return to work.THinks PCP Dr Jeanie Sewer signed FMLA  HPI Continued dizziness with headache, has been following with neurology. She is on ibuprofen, baclofen, amitriptyline, and valproate headaches. Pain  in the left leg, which she states is in the back of her knee.  Completed speech therapy. She remains independent with all her self-care and mobility. She does not use an assistive device.   Pain Inventory Average Pain 5 Pain Right Now 1 My pain is intermittent  In the last 24 hours, has pain interfered with the following? General activity 4 Relation with others 2 Enjoyment of life 2 What TIME of day is your pain at its worst? varies Sleep (in general) Fair  Pain is worse with: unsure Pain improves with: rest and medication Relief from Meds: 5  Mobility walk without assistance walk with assistance how many minutes can you walk? 15  Function not employed: date last employed .  Neuro/Psych tremor dizziness confusion anxiety  Prior Studies Any changes since last visit?  no  Physicians involved in your care Any changes since last visit?  no   Family History  Problem Relation Age of Onset  . Stroke Father        40s  . Heart attack Father 42  . COPD Mother   . Heart failure Brother   . Cancer Maternal Grandmother        unknown   . COPD Unknown   . Heart failure Maternal Grandfather   . Hypertension Maternal Grandfather   . Cancer Paternal Grandfather        lung  . Diabetes Paternal Grandmother    Social History   Social History  . Marital status: Married    Spouse name: N/A  . Number of children: N/A  . Years of education: N/A   Occupational History  . Data processing manager    Social History Main  Topics  . Smoking status: Never Smoker  . Smokeless tobacco: Never Used  . Alcohol use No     Comment: rare   . Drug use: No  . Sexual activity: No   Other Topics Concern  . Not on file   Social History Narrative  . No narrative on file   Past Surgical History:  Procedure Laterality Date  . APPENDECTOMY    . CHOLECYSTECTOMY    . FOOT GANGLION EXCISION    . HERNIA REPAIR    . IR GENERIC HISTORICAL  12/30/2015   IR ANGIO INTRA EXTRACRAN SEL COM  CAROTID INNOMINATE BILAT MOD SED 12/30/2015 Julieanne Cotton, MD [facility]-INTERV RAD  . IR GENERIC HISTORICAL  12/30/2015   IR ANGIO VERTEBRAL SEL VERTEBRAL BILAT MOD SED 12/30/2015 Julieanne Cotton, MD [facility]INTER[facility]  . TEE WITHOUT CARDIOVERSION N/A 04/19/2015   Procedure: TRANSESOPHAGEAL ECHOCARDIOGRAM (TEE);  Surgeon: Jake Bathe, MD;  Location: [facility] ENDOSCOPY;  Service: Cardiovascular;  Laterality: N/A;  . VAGINAL HYSTERECTOMY     Past Medical History:  Diagnosis Date  . Diabetes mellitus without complication (HCC)   . Headache   . Hyperlipidemia   . Hypertension   . TIA (transient ischemic attack)    There were no vitals taken for this visit.  Opioid Risk Score:   Fall Risk Score:  `1  Depression screen PHQ 2/9  Depression screen PHQ 2/9 04/10/2016  Decreased Interest 2  Down, Depressed, Hopeless 2  PHQ - 2 Score 4  Altered sleeping 3  Tired, decreased energy 3  Change in appetite 0  Feeling bad or failure about yourself  1  Trouble concentrating 3  Moving slowly or fidgety/restless 3  Suicidal thoughts 1  PHQ-9 Score 18  Difficult doing work/chores Extremely dIfficult    Review of Systems  Constitutional: Negative.   HENT: Negative.   Eyes: Negative.   Respiratory: Negative.   Cardiovascular: Negative.   Gastrointestinal: Negative.   Endocrine:       High blood sugar  Genitourinary: Negative.   Musculoskeletal: Negative.   Skin: Negative.   Allergic/Immunologic: Negative.   Neurological: Positive for dizziness, tremors and headaches.  Hematological: Negative.   Psychiatric/Behavioral: Positive for confusion. The patient is nervous/anxious.   All other systems reviewed and are negative.      Objective:   Physical Exam Tenderness along the hamstring tendon, lateral group on the left side. There is no calf swelling or edema or tenderness. She has negative Homans. She ambulates without assistive device. No evidence toe drag or knee instability, she has a mildly  widened base of support. Extraocular muscles intact. Neuro:  Eyes without evidence of nystagmus  Tone is normal without evidence of spasticity Cerebellar exam shows no evidence of ataxia on finger nose finger or heel to shin testing No evidence of trunkal ataxia  Motor strength is 5/5 in bilateral deltoid, biceps, triceps, finger flexors and extensors, wrist flexors and extensors, hip flexors, knee flexors and extensors, ankle dorsiflexors, plantar flexors, invertors and evertors, toe flexors and extensors  Sensory exam is normal to pinprick, proprioception and light touch in the upper and lower limbs   Cranial nerves II- Visual fields are intact to confrontation testing, no blurring of vision III- no evidence of ptosis, upward, downward and medial gaze intact IV- no vertical diplopia or head tilt V- no facial numbness or masseter weakness VI- no pupil abduction weakness VII- no facial droop, good lid closure VII- normal auditory acuity IX- no pharygeal weakness, gag nl X- no  pharyngeal weakness, no hoarseness XI- no trap or SCM weakness XII- no glossal weakness         Assessment & Plan:   1. History of multiple strokes with prior left occipital infarct, right MCA branch infarct , most recently bilateral cerebral infarcts in October 2017. She is cognitive deficits related to multiple infarcts. Patient exhibits a mild aphasia, has plateaued in recovery with speech therapy.  Do not think patient needs any further therapy.  2. Chronic headaches, question vascular post-CVA versus more classic migraine, patient is following with neurology.  3. In terms of her cognitive deficits post CVA, she is undergoing neuropsychologic [facility] at the [facility], given that her work-related deficits are cognitive in nature, will defer any work restrictions and disability determination to neurology.  Follow-up physical medicine rehabilitation on when necessary basis

## 2016-07-28 NOTE — Patient Instructions (Signed)
Please follow up with Dr. Everlena CooperJaffe, for your headaches, dizziness, the neuropsychologic testing will be through his office and since her disability is based on your cognitive difficulties post stroke, Dr. Everlena CooperJaffe should be filling out those papers.

## 2016-08-18 ENCOUNTER — Encounter: Payer: 59 | Admitting: Psychology

## 2016-09-30 DIAGNOSIS — I639 Cerebral infarction, unspecified: Secondary | ICD-10-CM

## 2016-09-30 DIAGNOSIS — E119 Type 2 diabetes mellitus without complications: Secondary | ICD-10-CM

## 2016-09-30 DIAGNOSIS — R270 Ataxia, unspecified: Secondary | ICD-10-CM | POA: Diagnosis not present

## 2016-09-30 DIAGNOSIS — I160 Hypertensive urgency: Secondary | ICD-10-CM

## 2016-10-01 DIAGNOSIS — E119 Type 2 diabetes mellitus without complications: Secondary | ICD-10-CM | POA: Diagnosis not present

## 2016-10-01 DIAGNOSIS — I160 Hypertensive urgency: Secondary | ICD-10-CM | POA: Diagnosis not present

## 2016-10-01 DIAGNOSIS — R270 Ataxia, unspecified: Secondary | ICD-10-CM | POA: Diagnosis not present

## 2016-10-01 DIAGNOSIS — I639 Cerebral infarction, unspecified: Secondary | ICD-10-CM | POA: Diagnosis not present

## 2016-10-02 DIAGNOSIS — I6789 Other cerebrovascular disease: Secondary | ICD-10-CM | POA: Diagnosis not present

## 2016-10-02 DIAGNOSIS — I639 Cerebral infarction, unspecified: Secondary | ICD-10-CM | POA: Diagnosis not present

## 2016-10-02 DIAGNOSIS — R270 Ataxia, unspecified: Secondary | ICD-10-CM | POA: Diagnosis not present

## 2016-10-02 DIAGNOSIS — I160 Hypertensive urgency: Secondary | ICD-10-CM | POA: Diagnosis not present

## 2016-10-02 DIAGNOSIS — E119 Type 2 diabetes mellitus without complications: Secondary | ICD-10-CM | POA: Diagnosis not present

## 2016-10-03 DIAGNOSIS — R270 Ataxia, unspecified: Secondary | ICD-10-CM | POA: Diagnosis not present

## 2016-10-03 DIAGNOSIS — I639 Cerebral infarction, unspecified: Secondary | ICD-10-CM | POA: Diagnosis not present

## 2016-10-03 DIAGNOSIS — I160 Hypertensive urgency: Secondary | ICD-10-CM | POA: Diagnosis not present

## 2016-10-03 DIAGNOSIS — E119 Type 2 diabetes mellitus without complications: Secondary | ICD-10-CM | POA: Diagnosis not present

## 2016-10-06 ENCOUNTER — Telehealth: Payer: Self-pay | Admitting: Neurology

## 2016-10-06 ENCOUNTER — Encounter: Payer: Self-pay | Admitting: *Deleted

## 2016-10-06 NOTE — Telephone Encounter (Signed)
Caller: Rinaldo CloudPamela  Urgent? Yes  Reason for the call: She was scheduled to see Dr. Alinda DoomsBailar tomorrow for testing but she cancelled because she was just discharged over the weekend from the hospital from having another Stroke. She is scheduled for al follow up in November. Doctor Everlena CooperJaffe has a 7:30 appointment for tomorrow available. Could she be seen in that spot? Please Advise. Thanks

## 2016-10-06 NOTE — Telephone Encounter (Signed)
She can come in tomorrow.  However, she was admitted to an outside hospital as there are no records of a hospitalization this past weekend.  I will need all of those notes, MRI reports and labs by the end of the day so I can review prior to her visit.

## 2016-10-06 NOTE — Telephone Encounter (Signed)
Please advise 

## 2016-10-06 NOTE — Telephone Encounter (Signed)
Patient notified to come in tomorrow at 7:30.  All records requested.

## 2016-10-07 ENCOUNTER — Encounter: Payer: Self-pay | Admitting: Neurology

## 2016-10-07 ENCOUNTER — Ambulatory Visit (INDEPENDENT_AMBULATORY_CARE_PROVIDER_SITE_OTHER): Payer: 59 | Admitting: Neurology

## 2016-10-07 VITALS — BP 140/90 | HR 85 | Ht 64.25 in | Wt 235.6 lb

## 2016-10-07 DIAGNOSIS — I10 Essential (primary) hypertension: Secondary | ICD-10-CM

## 2016-10-07 DIAGNOSIS — I82409 Acute embolism and thrombosis of unspecified deep veins of unspecified lower extremity: Secondary | ICD-10-CM

## 2016-10-07 DIAGNOSIS — E785 Hyperlipidemia, unspecified: Secondary | ICD-10-CM

## 2016-10-07 DIAGNOSIS — Z794 Long term (current) use of insulin: Secondary | ICD-10-CM

## 2016-10-07 DIAGNOSIS — E118 Type 2 diabetes mellitus with unspecified complications: Secondary | ICD-10-CM | POA: Diagnosis not present

## 2016-10-07 DIAGNOSIS — F329 Major depressive disorder, single episode, unspecified: Secondary | ICD-10-CM

## 2016-10-07 DIAGNOSIS — I6300 Cerebral infarction due to thrombosis of unspecified precerebral artery: Secondary | ICD-10-CM | POA: Diagnosis not present

## 2016-10-07 DIAGNOSIS — F419 Anxiety disorder, unspecified: Secondary | ICD-10-CM

## 2016-10-07 DIAGNOSIS — I639 Cerebral infarction, unspecified: Secondary | ICD-10-CM | POA: Diagnosis not present

## 2016-10-07 MED ORDER — RIBOFLAVIN 400 MG PO CAPS: 400.0000 mg | ORAL_CAPSULE | Freq: Every day | ORAL | 2 refills | Status: DC

## 2016-10-07 MED ORDER — MAGNESIUM CITRATE 200 MG PO TABS: 400.0000 mg | ORAL_TABLET | Freq: Every day | ORAL | 2 refills | Status: DC

## 2016-10-07 MED ORDER — COENZYME Q-10 100 MG PO CAPS: 100.0000 mg | ORAL_CAPSULE | Freq: Three times a day (TID) | ORAL | 2 refills | Status: DC

## 2016-10-07 NOTE — Progress Notes (Signed)
NEUROLOGY FOLLOW UP OFFICE NOTE  Haskel Khanamela R Dragos [id number]  HISTORY OF PRESENT ILLNESS: Kelly Romero is a 51 year old right-handed female with diabetes, hypertension and depression, headache and prior strokes who presents for recent stroke.   UPDATE: Stroke: She was admitted to [facility] from on 10/02/16 for headache, dizziness and hypertension.  CT of head from 09/30/16 showed a subacute to chronic infarct in the left parietooccipital junction.  CTA of head from 10/01/16  demonstrated high-grade stenosis or occlusion at the left P2 segment bifurcation with nonvisualization of the superior left P3 segment. MRI of brain without contrast from 10/02/16 demonstrated a small acute infarct in the right body of the corpus callosum.  Carotid doppler showed moderate heterogeneous and calcified plaque without hemodynamically significant stenosis.  2D echo showed EF 60-65% with no PFO or other cardiac source of emboli.  Telemetry revealed no atrial fibrillation.  LDL was reportedly 239.  Rosuvastatin 40mg  was changed to atorvastatin 80mg .  She was maintained on ASA 325mg  and Plavix 75mg .  She was also treated for UTI.  Headache: Since June, headaches have increased in frequency again.. Intensity:  6-7/10 Duration:  All day Frequency:  Every other day Current NSAIDS:  no Current analgesics:  Ibuprofen, Tylenol Current triptans:  no Current anti-emetic:  no Current muscle relaxants:  no Current anti-anxiolytic:  no Current sleep aide:  no Current Antihypertensive medications:  lisinopril Current Antidepressant medications:  escitalopram 10mg  Current Anticonvulsant medications:  divalproex DR 500mg  twice daily Current Vitamins/Herbal/Supplements:  no Current Antihistamines/Decongestants:  no Other therapy:  no   HISTORY: I  Headaches: Onset:  July 2016.  Sudden onset. Location:  Right sided, from front to back of head.  Initially had pain behind right ear. Quality:   squeezing Initial intensity:  7-8/10 Aura:  no Prodrome:  no Associated symptoms:  Some photophobia and phonophobia.  Sometimes nausea.  She denies visual disturbance. Initial Duration:  All day Initial Frequency:  Daily.  Sometimes wakes her up at night Triggers/exacerbating factors:  Loud noise Relieving factors:  Laying in a cool dark quiet room   Past abortive medication:  Tylenol, Excedrin, Percocet, ibuprofen 800mg  Past preventative medication:  amitriptyline 25mg  Other past therapy:  none   Current abortive medication:  Ibuprofen 800mg  Antihypertensive medications:  Lisinopril, metoprolol Antidepressant medications:  lexapro 10mg  Anticonvulsant medications:  none Vitamins/Herbal/Supplements:  none Other therapy:  none   Sed Rate was 30.  MRI of the brain from 11/09/14 showed chronic small vessel ischemic changes with an incidental tiny subacute infarct in the right frontal lobe.     II  Encephalopathy/Stroke: She was in [facility] on 12/07/14 for stroke symptoms.  She developed slurred speech and right arm numbness.   She was agitated, yelling and screaming in the ED.  She was given Ativan and Haldol.  CT of head was unremarkable.  Repeat CT of head two days later was negative.  CXR negative.  Urine tox screen was positive for opioids.  She was found to have severally elevated blood pressure with systolic at 240 and higher.  Hgb A1c was 12 with serum glucose level of 400.  She did not have fever or leukocytosis, and UA was borderline with trace leukocytes but negative nitrite.  She was treated with 5 days of IV Rocephin.  Troponins were negative.  Etiology was unknown but TIA was suspected as a possibility.   She had a similar stroke-like episode on 02/22/15.  She woke up with headache and later developed  slurred speech.  She was admitted to [facility], where MRI of brain revealed 2 mm left occipital subacute infarct.  2D echo was performed, which appeared unremarkable,  but didn't make mention of a PFO.  Carotid doppler again showed no hemodynamically significant ICA stenosis.  Hypercoagulable panel, ANA, lupus panel, antiextractable nuclear antigen were negative.  LDL was 130.  Plavix was added to her ASA.  Crestor was increased to 40mg  daily.  Labs from August 2016 include Sed Rate 30, ANA negative, ANCA screen negative, antiextractable nuclear antigen (RNP abs, Smith abs) negative.  She had a TEE on 04/19/15, which revealed EF 60-65% with no PFO or atrial septal defect or other cardiac source of emboli.  After 3 months on dual antiplatelet therapy, she was continued on Plavix alone.  She was on amitriptyline 25mg  at bedtime to help with depression and headache.   She was admitted to [facility] between 08/15/15 and 08/19/15 for right-sided (however some notes state left-sided) weakness and numbness associated with forgetfulness and agitation.  Blood pressure was elevated.  NIHSS was 4.  Due to minimal symptoms, she did not receive tPA.  Urine tox screen was negative.  CT of head showed no acute findings.  MRI of brain with and without contrast revealed possible small acute infarct in the posterior right MCA territory.  However, it appears on previous imaging, which suggests it is likely an old finding.  Neurology believed it to be an old infarct, as it apparently was seen on a prior MRI in February 2017.  TTE again was unremarkable with EF of 65-70%.  LDL was 231.  Hgb A1c was 10.5.  Exact cause of encephalopathy was unclear.  She underwent an EEG on 08/18/15, which revealed mild to moderate generalized background slowing but no epileptiform activity.  Neurology felt symptoms may be psychogenic.  She was started on Depakote 500mg  twice daily for possible seizures.    She was maintained on ASA 81mg  and Plavix, as well as Rosuvastatin 20mg .  She reports word-finding difficulties, which has been ongoing for several months.   She was readmitted to [facility] from 12/26/15 to 12/22/2015  for altered mental status, fever of 103 and elevated blood pressure with systolic in 220s.  MRI of brain revealed small acute and subacute infarcts in the bilateral cerebral hemisphere.  Due to fever, she was started on IV antibiotics and acyclovir.  UA and urine tox screen was negative.  Blood cultures showed 1 of 2 positive for gram +, likely contaminated.  Endocarditis was not suspected.  She underwent LP.  CSF cell count was 1, gram stain negative, protein 49, glucose 98, oligoclonal bands negative, fungal cultures, CMV, VZV, VDRL and HSV were negative.  In the serum, HIV and RPR were nonreactive.  Arbovirus panel was negative.  ACE was 26.  Hypercoagulable panel (including homocysteine, lupus anticoagulant, beta-2-glycoprotein, cardiolipin antibodies) was negative.  TTE showed normal LVEF 55-60% and TCD showed clinically insignificant right to left shunt with valsalva only.  Cerebral angiogram revealed mild athersclerosis with 65-70% stenosis of left MCA superior division, severe stenosis of proximal PCA P1 segment and tapered stenosis of distal right pericallosal artery.  Hgb A1c was 13.  LDL was 153.  Lower extremity doppler revealed distal DVT in the right lower extremity.  Plavix was switched to ASA 325mg  daily.  DVT is being monitored and managed by her PCP.  Recommendation was to recheck venous ultrasound every 2 weeks and if there is any proximal extension, then  the switch to anticoagulation.  30 day Holter monitor from late 2017 revealed no arrhythmia.  III Cognitive impairment/aphasia: She underwent neuropsychological testing on 03/10/16.  She exhibited adjustment disorder with depression and anxiety.  However, a cognitive diagnosis could not be made due to poor effort on testing.  It was recommended to continue treating anxiety and depression.  PAST MEDICAL HISTORY: Past Medical History:  Diagnosis Date  . Diabetes mellitus without complication (HCC)   . Headache   . Hyperlipidemia   .  Hypertension   . TIA (transient ischemic attack)     MEDICATIONS: Current Outpatient Prescriptions on File Prior to Visit  Medication Sig Dispense Refill  . aspirin EC 325 MG EC tablet Take 1 tablet (325 mg total) by mouth daily. 30 tablet 1  . clopidogrel (PLAVIX) 75 MG tablet Take 1 tablet (75 mg total) by mouth daily. 30 tablet 11  . divalproex (DEPAKOTE) 500 MG DR tablet Take 1 tablet (500 mg total) by mouth 2 (two) times daily. 60 tablet 3  . Empagliflozin-Linagliptin (GLYXAMBI) 25-5 MG TABS Take 25 mg by mouth.    . escitalopram (LEXAPRO) 10 MG tablet Take 1 tablet (10 mg total) by mouth daily. 30 tablet 3  . ibuprofen (ADVIL,MOTRIN) 800 MG tablet Take 1 tablet (800 mg total) by mouth every 8 (eight) hours as needed. 30 tablet 2  . insulin detemir (LEVEMIR) 100 UNIT/ML injection Inject 85 Units into the skin every morning.     Marland Kitchen lisinopril (PRINIVIL,ZESTRIL) 30 MG tablet Take 1 tablet (30 mg total) by mouth daily. 30 tablet 3  . rosuvastatin (CRESTOR) 40 MG tablet Take 1 tablet (40 mg total) by mouth at bedtime. 30 tablet 3   No current facility-administered medications on file prior to visit.     ALLERGIES: Allergies  Allergen Reactions  . Erythromycin Itching  . Latex     itch    FAMILY HISTORY: Family History  Problem Relation Age of Onset  . Stroke Father        33s  . Heart attack Father 70  . COPD Mother   . Heart failure Brother   . Cancer Maternal Grandmother        unknown   . COPD Unknown   . Heart failure Maternal Grandfather   . Hypertension Maternal Grandfather   . Cancer Paternal Grandfather        lung  . Diabetes Paternal Grandmother     SOCIAL HISTORY: Social History   Social History  . Marital status: Married    Spouse name: N/A  . Number of children: N/A  . Years of education: N/A   Occupational History  . Data processing manager    Social History Main Topics  . Smoking status: Never Smoker  . Smokeless tobacco: Never Used  . Alcohol use  No     Comment: rare   . Drug use: No  . Sexual activity: No   Other Topics Concern  . Not on file   Social History Narrative  . No narrative on file    REVIEW OF SYSTEMS: Constitutional: No fevers, chills, or sweats, no generalized fatigue, change in appetite Eyes: No visual changes, double vision, eye pain Ear, nose and throat: No hearing loss, ear pain, nasal congestion, sore throat Cardiovascular: No chest pain, palpitations Respiratory:  No shortness of breath at rest or with exertion, wheezes GastrointestinaI: No nausea, vomiting, diarrhea, abdominal pain, fecal incontinence Genitourinary:  No dysuria, urinary retention or frequency Musculoskeletal:  No neck pain, back  pain Integumentary: No rash, pruritus, skin lesions Neurological: as above Psychiatric: No depression, insomnia, anxiety Endocrine: No palpitations, fatigue, diaphoresis, mood swings, change in appetite, change in weight, increased thirst Hematologic/Lymphatic:  No purpura, petechiae. Allergic/Immunologic: no itchy/runny eyes, nasal congestion, recent allergic reactions, rashes  PHYSICAL EXAM: Vitals:   10/07/16 0731  BP: 140/90  Pulse: 85   General: No acute distress.  Patient appears wee-groomed.  . Head:  Normocephalic/atraumatic Eyes:  Fundi examined but not visualized Neck: supple, no paraspinal tenderness, full range of motion Heart:  Regular rate and rhythm Lungs:  Clear to auscultation bilaterally Back: No paraspinal tenderness Neurological Exam: alert and oriented to person, place, and time. Attention span and concentration intact, delayed recall reduced, remote memory intact, fund of knowledge intact.  Speech slow but fluent.  Naming fluency slow but improved.  Able to name and repeat.  No dysarthria.  CN II-XII intact. Bulk and tone normal, muscle strength 5/5 throughout.  Sensation to light touch  intact.  Deep tendon reflexes 2+ throughout.  Finger to nose testing intact.  Gait normal,  Romberg negative.  IMPRESSION: 1.  Recurrent strokes, cryptogenic.  She has history of diabetes, hypertension and hyperlipidemia which need to be optimized.  However, other stroke workup has been unremarkable (including TTE/TEE, 30 day Holter monitor, carotid doppler and hypercoagulable panel).  She had a DVT but no PFO. 2.  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 3.  Migraine 4.  Hypertension 5.  Hyperlipidemia 6.  Morbid obesity 7.  Depression and anxiety  PLAN: 1.  Continue ASA 325mg  and Plavix 75mg  daily for secondary stroke prevention. 2.  Continue atorvastatin 80mg  therapy.  Check LDL in 3 months prior to follow up. 3.  At this point, we will refer her for implantable loop recorder. 4.  Optimize blood pressure and glycemic control as per PCP 5.  Instructed weight loss and diet (e.g. Mediterranean diet) 6.  Continue Depakote ER 500mg  twice daily for headache preventative.  Check CBC, LFTs and TSH in 3 months prior to follow up. 7.  Lifestyle modification to help reduce headaches:  Exercise, water, diet/Mediterranean diet, and supplements (Mg, riboflavin, CoQ10) 8.  Follow up in 3 months.  28 minutes spent face to face with patient, over 50% spent discussing management.  Shon MilletAdam Beckett Maden, DO  CC:  Gwendlyn DeutscherJohn [name] II, MD

## 2016-10-07 NOTE — Patient Instructions (Addendum)
1.  Take these supplements:  Magnesium citrate 400mg  daily, riboflavin 400mg , Coenzyme Q 10 100mg  three times daily 2.  When you get a headache, take acetaminophen-caffeine pill (Excedrin Tension).  Limit use to no more than 2 days out of the week.  Stop ibuprofen and other pain medication. 3.  Improve diet.  Follow Mediterranean diet as listed below.  Stop sweets, fast food, hot dogs, sausages, deli meat, caffeine, chocolate, soda. 4.  Increase water intake 5.  Continue aspirin 325mg  daily and Plavix 75mg  daily 6.  Start atorvastatin 80mg  daily. 7.  Try to continue working on getting diabetes and blood pressure under control 8.  Continue divalproex DR 500mg  twice daily 9.  We will refer you for implantable loop recorder 10.  Check lipid panel, CBC, CMP and TSH in 3 months prior to follow up.   Mediterranean Diet A Mediterranean diet refers to food and lifestyle choices that are based on the traditions of countries located on the [place]. This way of eating has been shown to help prevent certain conditions and improve outcomes for people who have chronic diseases, like kidney disease and heart disease. What are tips for following this plan? Lifestyle  Cook and eat meals together with your family, when possible.  Drink enough fluid to keep your urine clear or pale yellow.  Be physically active every day. This includes: ? Aerobic exercise like running or swimming. ? Leisure activities like gardening, walking, or housework.  Get 7-8 hours of sleep each night.  If recommended by your health care provider, drink red wine in moderation. This means 1 glass a day for nonpregnant women and 2 glasses a day for men. A glass of wine equals 5 oz (150 mL). Reading food labels  Check the serving size of packaged foods. For foods such as rice and pasta, the serving size refers to the amount of cooked product, not dry.  Check the total fat in packaged foods. Avoid foods that have saturated  fat or trans fats.  Check the ingredients list for added sugars, such as corn syrup. Shopping  At the grocery store, buy most of your food from the areas near the walls of the store. This includes: ? Fresh fruits and vegetables (produce). ? Grains, beans, nuts, and seeds. Some of these may be available in unpackaged forms or large amounts (in bulk). ? Fresh seafood. ? Poultry and eggs. ? Low-fat dairy products.  Buy whole ingredients instead of prepackaged foods.  Buy fresh fruits and vegetables in-season from local farmers markets.  Buy frozen fruits and vegetables in resealable bags.  If you do not have access to quality fresh seafood, buy precooked frozen shrimp or canned fish, such as tuna, salmon, or sardines.  Buy small amounts of raw or cooked vegetables, salads, or olives from the deli or salad bar at your store.  Stock your pantry so you always have certain foods on hand, such as olive oil, canned tuna, canned tomatoes, rice, pasta, and beans. Cooking  Cook foods with extra-virgin olive oil instead of using butter or other vegetable oils.  Have meat as a side dish, and have vegetables or grains as your main dish. This means having meat in small portions or adding small amounts of meat to foods like pasta or stew.  Use beans or vegetables instead of meat in common dishes like chili or lasagna.  Experiment with different cooking methods. Try roasting or broiling vegetables instead of steaming or sauteing them.  Add frozen vegetables  to soups, stews, pasta, or rice.  Add nuts or seeds for added healthy fat at each meal. You can add these to yogurt, salads, or vegetable dishes.  Marinate fish or vegetables using olive oil, lemon juice, garlic, and fresh herbs. Meal planning  Plan to eat 1 vegetarian meal one day each week. Try to work up to 2 vegetarian meals, if possible.  Eat seafood 2 or more times a week.  Have healthy snacks readily available, such  as: ? Vegetable sticks with hummus. ? [place] yogurt. ? Fruit and nut trail mix.  Eat balanced meals throughout the week. This includes: ? Fruit: 2-3 servings a day ? Vegetables: 4-5 servings a day ? Low-fat dairy: 2 servings a day ? Fish, poultry, or lean meat: 1 serving a day ? Beans and legumes: 2 or more servings a week ? Nuts and seeds: 1-2 servings a day ? Whole grains: 6-8 servings a day ? Extra-virgin olive oil: 3-4 servings a day  Limit red meat and sweets to only a few servings a month What are my food choices?  Mediterranean diet ? Recommended ? Grains: Whole-grain pasta. Brown rice. Bulgar wheat. Polenta. Couscous. Whole-wheat bread. Orpah Cobbatmeal. Quinoa. ? Vegetables: Artichokes. Beets. Broccoli. Cabbage. Carrots. Eggplant. Green beans. Chard. Kale. Spinach. Onions. Leeks. Peas. Squash. Tomatoes. Peppers. Radishes. ? Fruits: Apples. Apricots. Avocado. Berries. Bananas. Cherries. Dates. Figs. Grapes. Lemons. Melon. Oranges. Peaches. Plums. Pomegranate. ? Meats and other protein foods: Beans. Almonds. Sunflower seeds. Pine nuts. Peanuts. Cod. Salmon. Scallops. Shrimp. Tuna. Tilapia. Clams. Oysters. Eggs. ? Dairy: Low-fat milk. Cheese. Greek yogurt. ? Beverages: Water. Red wine. Herbal tea. ? Fats and oils: Extra virgin olive oil. Avocado oil. Grape seed oil. ? Sweets and desserts: [place] yogurt with honey. Baked apples. Poached pears. Trail mix. ? Seasoning and other foods: Basil. Cilantro. Coriander. Cumin. Mint. Parsley. Sage. Rosemary. Tarragon. Garlic. Oregano. Thyme. Pepper. Balsalmic vinegar. Tahini. Hummus. Tomato sauce. Olives. Mushrooms. ? Limit these ? Grains: Prepackaged pasta or rice dishes. Prepackaged cereal with added sugar. ? Vegetables: Deep fried potatoes (french fries). ? Fruits: Fruit canned in syrup. ? Meats and other protein foods: Beef. Pork. Lamb. Poultry with skin. Hot dogs. Tomasa BlaseBacon. ? Dairy: Ice cream. Sour cream. Whole milk. ? Beverages: Juice.  Sugar-sweetened soft drinks. Beer. Liquor and spirits. ? Fats and oils: Butter. Canola oil. Vegetable oil. Beef fat (tallow). Lard. ? Sweets and desserts: Cookies. Cakes. Pies. Candy. ? Seasoning and other foods: Mayonnaise. Premade sauces and marinades. ? The items listed may not be a complete list. Talk with your dietitian about what dietary choices are right for you. Summary  The Mediterranean diet includes both food and lifestyle choices.  Eat a variety of fresh fruits and vegetables, beans, nuts, seeds, and whole grains.  Limit the amount of red meat and sweets that you eat.  Talk with your health care provider about whether it is safe for you to drink red wine in moderation. This means 1 glass a day for nonpregnant women and 2 glasses a day for men. A glass of wine equals 5 oz (150 mL). This information is not intended to replace advice given to you by your health care provider. Make sure you discuss any questions you have with your health care provider. Document Released: 10/24/2015 Document Revised: 11/26/2015 Document Reviewed: 10/24/2015 Elsevier Interactive Patient Education  [employer]2018 Elsevier Inc.

## 2016-10-08 ENCOUNTER — Telehealth: Payer: Self-pay | Admitting: Emergency Medicine

## 2016-10-08 ENCOUNTER — Encounter: Payer: 59 | Admitting: Psychology

## 2016-10-08 ENCOUNTER — Emergency Department
Admission: EM | Admit: 2016-10-08 | Discharge: 2016-10-08 | Discharge status: Against Medical Advice | Payer: 59 | Attending: Emergency Medicine | Admitting: Emergency Medicine

## 2016-10-08 ENCOUNTER — Encounter: Payer: Self-pay | Admitting: *Deleted

## 2016-10-08 DIAGNOSIS — R51 Headache: Secondary | ICD-10-CM | POA: Diagnosis not present

## 2016-10-08 NOTE — Telephone Encounter (Signed)
Called patient due to lwot to inquire about condition and follow up plans. Left message.   

## 2016-10-08 NOTE — ED Triage Notes (Addendum)
Pt to triage via wheelchair.  Pt reports a headache since 6am yesterday.  Pt saw her neuro doctor yesterday and is taking excedrin tension  for headache.  No n/v/d.  Pt alert   Speech clear.  Pt also has small abrasion to left lower leg.   Pt drinking water in triage without diff.

## 2016-10-15 ENCOUNTER — Encounter: Payer: 59 | Admitting: Psychology

## 2016-11-06 ENCOUNTER — Ambulatory Visit (INDEPENDENT_AMBULATORY_CARE_PROVIDER_SITE_OTHER): Payer: Medicaid Other | Admitting: Cardiovascular Disease

## 2016-11-06 ENCOUNTER — Encounter (INDEPENDENT_AMBULATORY_CARE_PROVIDER_SITE_OTHER): Payer: Self-pay

## 2016-11-06 ENCOUNTER — Encounter: Payer: Self-pay | Admitting: Cardiovascular Disease

## 2016-11-06 VITALS — BP 192/102 | HR 91 | Ht 64.0 in | Wt 236.0 lb

## 2016-11-06 DIAGNOSIS — E785 Hyperlipidemia, unspecified: Secondary | ICD-10-CM | POA: Diagnosis not present

## 2016-11-06 DIAGNOSIS — I10 Essential (primary) hypertension: Secondary | ICD-10-CM | POA: Diagnosis not present

## 2016-11-06 DIAGNOSIS — R079 Chest pain, unspecified: Secondary | ICD-10-CM | POA: Insufficient documentation

## 2016-11-06 DIAGNOSIS — I208 Other forms of angina pectoris: Secondary | ICD-10-CM

## 2016-11-06 LAB — LIPID PANEL
Chol/HDL Ratio: 6.6 ratio — ABNORMAL HIGH (ref 0.0–4.4)
Cholesterol, Total: 329 mg/dL — ABNORMAL HIGH (ref 100–199)
HDL: 50 mg/dL (ref >39)
LDL Calculated: 229 mg/dL — ABNORMAL HIGH (ref 0–99)
Triglycerides: 248 mg/dL — ABNORMAL HIGH (ref 0–149)
VLDL CHOLESTEROL CAL: 50 mg/dL — AB (ref 5–40)

## 2016-11-06 LAB — HEPATIC FUNCTION PANEL
ALK PHOS: 54 IU/L (ref 39–117)
ALT: 10 IU/L (ref 0–32)
AST: 17 IU/L (ref 0–40)
Albumin: 3.4 g/dL — ABNORMAL LOW (ref 3.5–5.5)
Bilirubin Total: 0.3 mg/dL (ref 0.0–1.2)
Bilirubin, Direct: 0.09 mg/dL (ref 0.00–0.40)
TOTAL PROTEIN: 5.9 g/dL — AB (ref 6.0–8.5)

## 2016-11-06 MED ORDER — NEBIVOLOL HCL 5 MG PO TABS: 5.0000 mg | ORAL_TABLET | Freq: Every day | ORAL | 6 refills | Status: DC

## 2016-11-06 NOTE — Assessment & Plan Note (Signed)
History of essential hypertension blood pressure measures 192/102. She does have difficult to control hypertension and she measures as at home. She is on lisinopril. 5 mg of Bystolic  I will have her check her blood pressures on a daily basis. She'll see Belenda Cruise back one month for follow-up. I'm also going to get a renal Dopplers to rule out renal vascular hypertension.

## 2016-11-06 NOTE — Assessment & Plan Note (Signed)
Kelly Romero has occasional chest pain however this morning she woke up and had jaw pain. She does have history of hypertension, hyperlipidemia and diabetes. I do not think she is able to meaningfully perform a exercise stress test and therefore I'm going to order a formal neurologic Myoview stress test to rule out an ischemic etiology.

## 2016-11-06 NOTE — Assessment & Plan Note (Signed)
History of hyperlipidemia on statin therapy with lipid profile performed 12/28/15 revealing a total cholesterol of 212, LDL 153 and HDL of 27. I'm going to recheck a lipid and liver profile

## 2016-11-06 NOTE — Patient Instructions (Signed)
Medication Instructions: START Bystolic 5 mg daily.  Samples given today.   Labwork: Your physician recommends that you return for a FASTING lipid profile and hepatic function panel at your earliest convenience.   Testing/Procedures: Your physician has requested that you have a renal artery duplex. During this test, an ultrasound is used to evaluate blood flow to the kidneys. Allow one hour for this exam. Do not eat after midnight the day before and avoid carbonated beverages. Take your medications as you usually do.  Your physician has requested that you have a lexiscan myoview. For further information please visit [email or web address] Please follow instruction sheet, as given.   Follow-Up: You have been referred to Dr. Thurmon Fair for evaluation for Loop Recorder Implantation.  Your physician recommends that you schedule a follow-up appointment in: 1 month with PharmD for BP. Your physician has requested that you regularly monitor and record your blood pressure readings at home. Please use the same machine at the same time of day to check your readings and record them to bring to your follow-up visit.  Your physician recommends that you schedule a follow-up appointment in: 2 months with Dr. Allyson Sabal.  If you need a refill on your cardiac medications before your next appointment, please call your pharmacy.

## 2016-11-06 NOTE — Progress Notes (Signed)
11/06/2016 Kelly Romero   [birth year]  [id number]  Primary Physician Jeanie Sewer Valrie Hart, MD Primary Cardiologist: Runell Gess MD Nicholes Calamity, [place]  HPI:  Kelly Romero is a 51 y.o.  moderately overweight married Caucasian female mother of one child who is accompanied by her son-in-law Maisie Fus today. She was referred by Dr. Everlena Cooper for cardiovascular evaluation because of recurrent strokes. He really wanted a loop recorder implanted. She has a history of true hypertension, diabetes and hyperlipidemia. She does not smoke. There is no family history. She's had multiple strokes but never has had a myocardial infarction. She has had extensive neurologic workup including CT, MRA, MRI, carotid Doppler studies and a transesophageal echo performed a year ago that showed no evidence of a PFO. She had an event monitor that showed no arrhythmias. She does get occasional chest pain and had jaw pain at 3:00 this morning.   Current Meds  Medication Sig  . aspirin EC 325 MG EC tablet Take 1 tablet (325 mg total) by mouth daily.  Marland Kitchen atorvastatin (LIPITOR) 80 MG tablet Take 80 mg by mouth daily.  . clopidogrel (PLAVIX) 75 MG tablet Take 1 tablet (75 mg total) by mouth daily.  . Coenzyme Q-10 100 MG capsule Take 1 capsule (100 mg total) by mouth 3 (three) times daily.  . divalproex (DEPAKOTE) 500 MG DR tablet Take 1 tablet (500 mg total) by mouth 2 (two) times daily.  . Empagliflozin-Linagliptin (GLYXAMBI) 25-5 MG TABS Take 25 mg by mouth.  . escitalopram (LEXAPRO) 20 MG tablet Take 20 mg by mouth daily.  . fenofibrate 160 MG tablet Take 160 mg by mouth daily.  Marland Kitchen ibuprofen (ADVIL,MOTRIN) 800 MG tablet Take 1 tablet (800 mg total) by mouth every 8 (eight) hours as needed.  . insulin detemir (LEVEMIR) 100 UNIT/ML injection Inject 85 Units into the skin every morning.   Marland Kitchen lisinopril (PRINIVIL,ZESTRIL) 30 MG tablet Take 1 tablet (30 mg total) by mouth daily.  . Magnesium Citrate 200 MG TABS  Take 400 mg by mouth daily.  . pantoprazole (PROTONIX) 40 MG tablet Take 40 mg by mouth daily.  . rosuvastatin (CRESTOR) 40 MG tablet Take 1 tablet (40 mg total) by mouth at bedtime.     Allergies  Allergen Reactions  . Erythromycin Itching  . Latex     itch    Social History   Social History  . Marital status: Married    Spouse name: N/A  . Number of children: N/A  . Years of education: N/A   Occupational History  . Data processing manager    Social History Main Topics  . Smoking status: Never Smoker  . Smokeless tobacco: Never Used  . Alcohol use No     Comment: rare   . Drug use: No  . Sexual activity: No   Other Topics Concern  . Not on file   Social History Narrative  . No narrative on file     Review of Systems: General: negative for chills, fever, night sweats or weight changes.  Cardiovascular: negative for chest pain, dyspnea on exertion, edema, orthopnea, palpitations, paroxysmal nocturnal dyspnea or shortness of breath Dermatological: negative for rash Respiratory: negative for cough or wheezing Urologic: negative for hematuria Abdominal: negative for nausea, vomiting, diarrhea, bright red blood per rectum, melena, or hematemesis Neurologic: negative for visual changes, syncope, or dizziness All other systems reviewed and are otherwise negative except as noted above.    Blood pressure (!) 192/102, pulse  91, height 5\' 4"  (1.626 m), weight 236 lb (107 kg).  General appearance: alert and no distress Neck: no adenopathy, no carotid bruit, no JVD, supple, symmetrical, trachea midline and thyroid not enlarged, symmetric, no tenderness/mass/nodules Lungs: clear to auscultation bilaterally Heart: regular rate and rhythm, S1, S2 normal, no murmur, click, rub or gallop Extremities: extremities normal, atraumatic, no cyanosis or edema  EKG sinus rhythm at 91 with nonspecific ST and T-wave changes. I personally reviewed this EKG.  ASSESSMENT AND PLAN:   Essential  hypertension History of essential hypertension blood pressure measures 192/102. She does have difficult to control hypertension and she measures as at home. She is on lisinopril. 5 mg of Bystolic  I will have her check her blood pressures on a daily basis. She'll see Belenda Cruise back one month for follow-up. I'm also going to get a renal Dopplers to rule out renal vascular hypertension.  Chest pain Mrs. Lotspeich has occasional chest pain however this morning she woke up and had jaw pain. She does have history of hypertension, hyperlipidemia and diabetes. I do not think she is able to meaningfully perform a exercise stress test and therefore I'm going to order a formal neurologic Myoview stress test to rule out an ischemic etiology.  Hyperlipidemia History of hyperlipidemia on statin therapy with lipid profile performed 12/28/15 revealing a total cholesterol of 212, LDL 153 and HDL of 27. I'm going to recheck a lipid and liver profile      Runell Gess MD [facility], [facility] 11/06/2016 10:21 AM

## 2016-11-06 NOTE — Addendum Note (Signed)
Addended by: Khadeejah Castner L on: 11/06/2016 04:34 PM   Modules accepted: Orders  

## 2016-11-11 ENCOUNTER — Other Ambulatory Visit: Payer: Self-pay | Admitting: Neurology

## 2016-11-11 ENCOUNTER — Other Ambulatory Visit: Payer: Self-pay | Admitting: Cardiovascular Disease

## 2016-11-11 DIAGNOSIS — I10 Essential (primary) hypertension: Secondary | ICD-10-CM

## 2016-11-24 ENCOUNTER — Telehealth (HOSPITAL_COMMUNITY): Payer: Self-pay | Admitting: *Deleted

## 2016-11-24 NOTE — Telephone Encounter (Signed)
Close encounter 

## 2016-11-25 ENCOUNTER — Ambulatory Visit (HOSPITAL_COMMUNITY)
Admission: RE | Admit: 2016-11-25 | Discharge: 2016-11-25 | Disposition: A | Discharge status: Home / Self Care | Payer: Medicaid Other | Source: Ambulatory Visit | Attending: Cardiovascular Disease | Admitting: Cardiovascular Disease

## 2016-11-25 DIAGNOSIS — I774 Celiac artery compression syndrome: Secondary | ICD-10-CM | POA: Insufficient documentation

## 2016-11-25 DIAGNOSIS — E669 Obesity, unspecified: Secondary | ICD-10-CM | POA: Insufficient documentation

## 2016-11-25 DIAGNOSIS — E785 Hyperlipidemia, unspecified: Secondary | ICD-10-CM

## 2016-11-25 DIAGNOSIS — R079 Chest pain, unspecified: Secondary | ICD-10-CM | POA: Diagnosis present

## 2016-11-25 DIAGNOSIS — Z6841 Body Mass Index (BMI) 40.0 and over, adult: Secondary | ICD-10-CM | POA: Insufficient documentation

## 2016-11-25 DIAGNOSIS — I208 Other forms of angina pectoris: Secondary | ICD-10-CM | POA: Diagnosis not present

## 2016-11-25 DIAGNOSIS — I10 Essential (primary) hypertension: Secondary | ICD-10-CM

## 2016-11-25 DIAGNOSIS — E119 Type 2 diabetes mellitus without complications: Secondary | ICD-10-CM | POA: Diagnosis not present

## 2016-11-25 DIAGNOSIS — R0609 Other forms of dyspnea: Secondary | ICD-10-CM | POA: Insufficient documentation

## 2016-11-25 DIAGNOSIS — K551 Chronic vascular disorders of intestine: Secondary | ICD-10-CM | POA: Insufficient documentation

## 2016-11-25 DIAGNOSIS — I2510 Atherosclerotic heart disease of native coronary artery without angina pectoris: Secondary | ICD-10-CM | POA: Insufficient documentation

## 2016-11-25 DIAGNOSIS — Z8673 Personal history of transient ischemic attack (TIA), and cerebral infarction without residual deficits: Secondary | ICD-10-CM | POA: Diagnosis not present

## 2016-11-25 DIAGNOSIS — R42 Dizziness and giddiness: Secondary | ICD-10-CM | POA: Insufficient documentation

## 2016-11-25 DIAGNOSIS — R6884 Jaw pain: Secondary | ICD-10-CM | POA: Insufficient documentation

## 2016-11-25 DIAGNOSIS — R5383 Other fatigue: Secondary | ICD-10-CM | POA: Diagnosis not present

## 2016-11-25 DIAGNOSIS — R002 Palpitations: Secondary | ICD-10-CM | POA: Insufficient documentation

## 2016-11-25 MED ORDER — REGADENOSON 0.4 MG/5ML IV SOLN
0.4000 mg | Freq: Once | INTRAVENOUS | Status: AC
  Administered 2016-11-25: 0.4 mg via INTRAVENOUS

## 2016-11-25 MED ORDER — AMINOPHYLLINE 25 MG/ML IV SOLN
75.0000 mg | Freq: Once | INTRAVENOUS | Status: AC
  Administered 2016-11-25: 75 mg via INTRAVENOUS

## 2016-11-25 MED ORDER — TECHNETIUM TC 99M TETROFOSMIN IV KIT
32.0000 | PACK | Freq: Once | INTRAVENOUS | Status: AC | PRN
  Administered 2016-11-25: 32 via INTRAVENOUS
  Filled 2016-11-25: qty 32

## 2016-11-26 ENCOUNTER — Ambulatory Visit (HOSPITAL_COMMUNITY)
Admission: RE | Admit: 2016-11-26 | Discharge: 2016-11-26 | Disposition: A | Discharge status: Home / Self Care | Payer: Medicaid Other | Source: Ambulatory Visit | Attending: Cardiology | Admitting: Cardiology

## 2016-11-26 LAB — MYOCARDIAL PERFUSION IMAGING
CHL CUP NUCLEAR SDS: 11
CHL CUP RESTING HR STRESS: 78 {beats}/min
LV dias vol: 84 mL (ref 46–106)
LVSYSVOL: 35 mL
Peak HR: 94 {beats}/min
SRS: 1
SSS: 12
TID: 0.92

## 2016-11-26 MED ORDER — TECHNETIUM TC 99M TETROFOSMIN IV KIT
31.2000 | PACK | Freq: Once | INTRAVENOUS | Status: AC | PRN
  Administered 2016-11-26: 31.2 via INTRAVENOUS

## 2016-11-30 NOTE — Progress Notes (Signed)
Repeat when necessary

## 2016-12-03 ENCOUNTER — Telehealth: Payer: Self-pay | Admitting: *Deleted

## 2016-12-03 NOTE — Telephone Encounter (Signed)
Left message to call back for results:  Notes recorded by Runell Gess, MD on 11/27/2016 at 8:01 AM EDT Essentially normal study. Repeat when clinically indicated.

## 2016-12-07 NOTE — Progress Notes (Signed)
Repeat when necessary 

## 2016-12-09 ENCOUNTER — Ambulatory Visit (INDEPENDENT_AMBULATORY_CARE_PROVIDER_SITE_OTHER): Payer: Medicaid Other | Admitting: Pharmacist

## 2016-12-09 VITALS — BP 160/88 | HR 74

## 2016-12-09 DIAGNOSIS — I10 Essential (primary) hypertension: Secondary | ICD-10-CM

## 2016-12-09 DIAGNOSIS — E785 Hyperlipidemia, unspecified: Secondary | ICD-10-CM

## 2016-12-09 MED ORDER — AMLODIPINE BESYLATE 10 MG PO TABS: 10.0000 mg | ORAL_TABLET | Freq: Every day | ORAL | 5 refills | Status: DC

## 2016-12-09 MED ORDER — ICOSAPENT ETHYL 1 G PO CAPS: 2.0000 g | ORAL_CAPSULE | Freq: Two times a day (BID) | ORAL | 5 refills | Status: DC

## 2016-12-09 MED ORDER — EVOLOCUMAB 140 MG/ML ~~LOC~~ SOAJ: 140.0000 mg | SUBCUTANEOUS | 11 refills | Status: DC

## 2016-12-09 NOTE — Assessment & Plan Note (Signed)
Blood pressure remains significantly elevated at 160/88 in clinic and 168/104 average reading at home. Noted symptoms of fatigue, headaches and dizziness related to previous strokes. Will add amlodipine  daily yo current regimen. Patient instructed to measure BP twice daily for 4 weeks and keep records to bring to next office visit. Noted incoming f/u visit with cardiologist. Will follow up in 8 weeks at the HTN clinic as needed.

## 2016-12-09 NOTE — Progress Notes (Signed)
Patient ID: Kelly Romero                 DOB: 06/23/1965                      MRN: [id number]     HPI:  Kelly Romero is a 51 y.o. female referred by Dr. Allyson Sabal to HTN clinic.  PMH includes multiple stokes , myocardiac infart , hypertension, diabetes, and hyperlipidemia. Also noted history of DVT. Patient reports 1st stroke in 2016, report frequent falls, fatigue and dizziness since strokes.  Patient presents to clinic accompany by her son-in-law due to memory problems remembering instructions. Denies chest pain, or swelling. Reports compliance with all prescribed medication including insulin and rosuvastatin but unable to tolerate combination of statin plus fenofibrate.  Current HTN meds:  Nebivolol (Bystolic)  daily Lisinopril  daily  Intolerance (lipid management) Fenofibrate - developed generalized body pain  Current lipid meds: Rosuvastatin  every day  BP goal: < 130/80  LDL goal: < 70 mg/dL  Family History: father had a stoke, brother died of cardiac complication at age 11  Social History: denies tobacco use, or alcohol  Diet: low sodium diet; eats at home most times, her family cooks for her  Exercise: sedentary - very dizzy due to strokes and unable to walk for long time (previous falls)  Home BP readings: 6 readings; average reading 168/104  186/111 157/97  164/97  150/109  162/96  194/118  Relevant Labs:  11/06/2016: CHO 329; TG 248; LDL 229 (on rosuvastatin ) 12/28/2015:CHO 212; TG 158; LDL 153  Wt Readings from Last 3 Encounters:  11/25/16 236 lb (107 kg)  11/06/16 236 lb (107 kg)  10/07/16 235 lb 9 oz (106.9 kg)   BP Readings from Last 3 Encounters:  12/09/16 (!) 160/88  11/06/16 (!) 192/102  10/08/16 (!) 164/83   Pulse Readings from Last 3 Encounters:  12/09/16 74  11/06/16 91  10/08/16 (!) 102    Past Medical History:  Diagnosis Date  . Diabetes mellitus without complication (HCC)   . Headache   . Hyperlipidemia   .  Hypertension   . TIA (transient ischemic attack)     Current Outpatient Prescriptions on File Prior to Visit  Medication Sig Dispense Refill  . clopidogrel (PLAVIX) 75 MG tablet Take 1 tablet (75 mg total) by mouth daily. 30 tablet 11  . divalproex (DEPAKOTE) 500 MG DR tablet Take 1 tablet (500 mg total) by mouth 2 (two) times daily. 60 tablet 3  . Empagliflozin-Linagliptin (GLYXAMBI) 25-5 MG TABS Take 25 mg by mouth.    . escitalopram (LEXAPRO) 20 MG tablet Take 20 mg by mouth daily.    Marland Kitchen ibuprofen (ADVIL,MOTRIN) 800 MG tablet Take 1 tablet (800 mg total) by mouth every 8 (eight) hours as needed. 30 tablet 2  . insulin detemir (LEVEMIR) 100 UNIT/ML injection Inject 85 Units into the skin every morning.     Marland Kitchen lisinopril (PRINIVIL,ZESTRIL) 30 MG tablet Take 1 tablet (30 mg total) by mouth daily. 30 tablet 3  . nebivolol (BYSTOLIC) 5 MG tablet Take 1 tablet (5 mg total) by mouth daily. 30 tablet 6  . rosuvastatin (CRESTOR) 40 MG tablet Take 1 tablet (40 mg total) by mouth at bedtime. 30 tablet 3  . aspirin EC 325 MG EC tablet Take 1 tablet (325 mg total) by mouth daily. (Patient not taking: Reported on 12/09/2016) 30 tablet 1  . Coenzyme Q-10 100 MG capsule Take 1  capsule (100 mg total) by mouth 3 (three) times daily. (Patient not taking: Reported on 12/09/2016) 90 capsule 2  . fenofibrate 160 MG tablet Take 160 mg by mouth daily.    . Magnesium Citrate 200 MG TABS Take 400 mg by mouth daily. (Patient not taking: Reported on 12/09/2016) 30 tablet 2  . pantoprazole (PROTONIX) 40 MG tablet Take 40 mg by mouth daily.     No current facility-administered medications on file prior to visit.     Allergies  Allergen Reactions  . Erythromycin Itching  . Latex     itch    Blood pressure (!) 160/88, pulse 74, SpO2 97 %.  Essential hypertension Blood pressure remains significantly elevated at 160/88 in clinic and 168/104 average reading at home. Noted symptoms of fatigue, headaches and dizziness  related to previous strokes. Will add amlodipine  daily yo current regimen. Patient instructed to measure BP twice daily for 4 weeks and keep records to bring to next office visit. Noted incoming f/u visit with cardiologist. Will follow up in 8 weeks at the HTN clinic as needed.   Hyperlipidemia LDL well above goal of < 70 mg/dL for secondary prevention. Triglycerides significantly elevated as well.  Will add Vascepa 2 grams twice daily and initiate paperwork for PCSK9i pre-approval.  Diet modifications to improve cholesterol and triglycerides also discussed during this appointment. PCSK9i indication, dose, administration, storage, common side effects and financial responsibility discussed during appointment. Patient assistance options for Repatha and Praluent briefly discussed during appointment as well. Plan to repeat fasting lipid panel in 6-8 weeks.   Milee Qualls Rodriguez-Guzman PharmD, BCPS, CPP [facility] [address] [place] [address] 12/09/2016 8:41 PM

## 2016-12-09 NOTE — Assessment & Plan Note (Signed)
LDL well above goal of < 70 mg/dL for secondary prevention. Triglycerides significantly elevated as well.  Will add Vascepa 2 grams twice daily and initiate paperwork for PCSK9i pre-approval.  Diet modifications to improve cholesterol and triglycerides also discussed during this appointment. PCSK9i indication, dose, administration, storage, common side effects and financial responsibility discussed during appointment. Patient assistance options for Repatha and Praluent briefly discussed during appointment as well. Plan to repeat fasting lipid panel in 6-8 weeks.

## 2016-12-09 NOTE — Patient Instructions (Addendum)
Return for a follow up appointment in 4 weeks  Your blood pressure today is 160/88 pulse 74  Check your blood pressure at home daily (if able) and keep record of the readings.  *START taking amlodipine  daily* *START taking Viscepa (ogema -3) 1 capsule twice daily for 1 week, then increase to 2 capsules twice daily  *START paperwork for Repatha pre-authorization*   Bring all of your meds, your BP cuff and your record of home blood pressures to your next appointment.  Exercise as you're able, try to walk approximately 30 minutes per day.  Keep salt intake to a minimum, especially watch canned and prepared boxed foods.  Eat more fresh fruits and vegetables and fewer canned items.  Avoid eating in fast food restaurants.    HOW TO TAKE YOUR BLOOD PRESSURE: . Rest 5 minutes before taking your blood pressure. .  Don't smoke or drink caffeinated beverages for at least 30 minutes before. . Take your blood pressure before (not after) you eat. . Sit comfortably with your back supported and both feet on the floor (don't cross your legs). . Elevate your arm to heart level on a table or a desk. . Use the proper sized cuff. It should fit smoothly and snugly around your bare upper arm. There should be enough room to slip a fingertip under the cuff. The bottom edge of the cuff should be 1 inch above the crease of the elbow. . Ideally, take 3 measurements at one sitting and record the average.       Cholesterol Cholesterol is a fat. Your body needs a small amount of cholesterol. Cholesterol (plaque) may build up in your blood vessels (arteries). That makes you more likely to have a heart attack or stroke. You cannot feel your cholesterol level. Having a blood test is the only way to find out if your level is high. Keep your test results. Work with your doctor to keep your cholesterol at a good level. What do the results mean?  Total cholesterol is how much cholesterol is in your blood.  LDL is  bad cholesterol. This is the type that can build up. Try to have low LDL.  HDL is good cholesterol. It cleans your blood vessels and carries LDL away. Try to have high HDL.  Triglycerides are fat that the body can store or burn for energy. What are good levels of cholesterol?  Total cholesterol below 200.  LDL below 100 is good for people who have health risks. LDL below 70 is good for people who have very high risks.  HDL above 40 is good. It is best to have HDL of 60 or higher.  Triglycerides below 150. How can I lower my cholesterol? Diet Follow your diet program as told by your doctor.  Choose fish, white meat chicken, or [place] that is roasted or baked. Try not to eat red meat, fried foods, sausage, or lunch meats.  Eat lots of fresh fruits and vegetables.  Choose whole grains, beans, pasta, potatoes, and cereals.  Choose olive oil, corn oil, or canola oil. Only use small amounts.  Try not to eat butter, mayonnaise, shortening, or palm kernel oils.  Try not to eat foods with trans fats.  Choose low-fat or nonfat dairy foods. ? Drink skim or nonfat milk. ? Eat low-fat or nonfat yogurt and cheeses. ? Try not to drink whole milk or cream. ? Try not to eat ice cream, egg yolks, or full-fat cheeses.  Healthy desserts include angel  food cake, ginger snaps, animal crackers, hard candy, popsicles, and low-fat or nonfat frozen yogurt. Try not to eat pastries, cakes, pies, and cookies.  Exercise Follow your exercise program as told by your doctor.  Be more active. Try gardening, walking, and taking the stairs.  Ask your doctor about ways that you can be more active.  Medicine  Take over-the-counter and prescription medicines only as told by your doctor.  This information is not intended to replace advice given to you by your health care provider. Make sure you discuss any questions you have with your health care provider. Document Released: 05/29/2008 Document Revised:  10/02/2015 Document Reviewed: 09/12/2015 Elsevier Interactive Patient Education  2017 [employer].     Food Choices to Lower Your Triglycerides Triglycerides are a type of fat in your blood. High levels of triglycerides can increase the risk of heart disease and stroke. If your triglyceride levels are high, the foods you eat and your eating habits are very important. Choosing the right foods can help lower your triglycerides. What general guidelines do I need to follow?  Lose weight if you are overweight.  Limit or avoid alcohol.  Fill one half of your plate with vegetables and green salads.  Limit fruit to two servings a day. Choose fruit instead of juice.  Make one fourth of your plate whole grains. Look for the word "whole" as the first word in the ingredient list.  Fill one fourth of your plate with lean protein foods.  Enjoy fatty fish (such as salmon, mackerel, sardines, and tuna) three times a week.  Choose healthy fats.  Limit foods high in starch and sugar.  Eat more home-cooked food and less restaurant, buffet, and fast food.  Limit fried foods.  Cook foods using methods other than frying.  Limit saturated fats.  Check ingredient lists to avoid foods with partially hydrogenated oils (trans fats) in them. What foods can I eat? Grains Whole grains, such as whole wheat or whole grain breads, crackers, cereals, and pasta. Unsweetened oatmeal, bulgur, barley, quinoa, or brown rice. Corn or whole wheat flour tortillas. Vegetables Fresh or frozen vegetables (raw, steamed, roasted, or grilled). Green salads. Fruits All fresh, canned (in natural juice), or frozen fruits. Meat and Other Protein Products Ground beef (85% or leaner), grass-fed beef, or beef trimmed of fat. Skinless chicken or [place]. Ground chicken or [place]. Pork trimmed of fat. All fish and seafood. Eggs. Dried beans, peas, or lentils. Unsalted nuts or seeds. Unsalted canned or dry  beans. Dairy Low-fat dairy products, such as skim or 1% milk, 2% or reduced-fat cheeses, low-fat ricotta or cottage cheese, or plain low-fat yogurt. Fats and Oils Tub margarines without trans fats. Light or reduced-fat mayonnaise and salad dressings. Avocado. Safflower, olive, or canola oils. Natural peanut or almond butter. The items listed above may not be a complete list of recommended foods or beverages. Contact your dietitian for more options. What foods are not recommended? Grains White bread. White pasta. White rice. Cornbread. Bagels, pastries, and croissants. Crackers that contain trans fat. Vegetables White potatoes. Corn. Creamed or fried vegetables. Vegetables in a cheese sauce. Fruits Dried fruits. Canned fruit in light or heavy syrup. Fruit juice. Meat and Other Protein Products Fatty cuts of meat. Ribs, chicken wings, bacon, sausage, bologna, salami, chitterlings, fatback, hot dogs, bratwurst, and packaged luncheon meats. Dairy Whole or 2% milk, cream, half-and-half, and cream cheese. Whole-fat or sweetened yogurt. Full-fat cheeses. Nondairy creamers and whipped toppings. Processed cheese, cheese spreads, or cheese  curds. Sweets and Desserts Corn syrup, sugars, honey, and molasses. Candy. Jam and jelly. Syrup. Sweetened cereals. Cookies, pies, cakes, donuts, muffins, and ice cream. Fats and Oils Butter, stick margarine, lard, shortening, ghee, or bacon fat. Coconut, palm kernel, or palm oils. Beverages Alcohol. Sweetened drinks (such as sodas, lemonade, and fruit drinks or punches). The items listed above may not be a complete list of foods and beverages to avoid. Contact your dietitian for more information. This information is not intended to replace advice given to you by your health care provider. Make sure you discuss any questions you have with your health care provider. Document Released: 12/19/2003 Document Revised: 08/08/2015 Document Reviewed: 01/04/2013 Elsevier  Interactive Patient Education  2017 [employer].

## 2016-12-10 ENCOUNTER — Encounter: Payer: Self-pay | Admitting: Pharmacist

## 2017-01-06 ENCOUNTER — Ambulatory Visit (INDEPENDENT_AMBULATORY_CARE_PROVIDER_SITE_OTHER): Payer: Medicaid Other | Admitting: Cardiovascular Disease

## 2017-01-06 ENCOUNTER — Encounter: Payer: Self-pay | Admitting: Cardiovascular Disease

## 2017-01-06 DIAGNOSIS — I10 Essential (primary) hypertension: Secondary | ICD-10-CM | POA: Diagnosis not present

## 2017-01-06 DIAGNOSIS — I208 Other forms of angina pectoris: Secondary | ICD-10-CM | POA: Diagnosis not present

## 2017-01-06 DIAGNOSIS — E785 Hyperlipidemia, unspecified: Secondary | ICD-10-CM

## 2017-01-06 MED ORDER — NEBIVOLOL HCL 10 MG PO TABS: 10.0000 mg | ORAL_TABLET | Freq: Every day | ORAL | 3 refills | Status: DC

## 2017-01-06 MED ORDER — LISINOPRIL 40 MG PO TABS: 40.0000 mg | ORAL_TABLET | Freq: Every day | ORAL | 3 refills | Status: DC

## 2017-01-06 NOTE — Assessment & Plan Note (Signed)
History of chest pain which occur several times a week associated with jaw pain with recent negative Myoview stress test 11/25/16. I am going to get a coronary CTA to further evaluate

## 2017-01-06 NOTE — Assessment & Plan Note (Signed)
History of hyperlipidemia on statin therapy with recent lipid profile performed 11/06/16 revealing an LDL 229, HDL of 50 and triglyceride level of 248 consistent with "metabolic syndrome". I'm going to refer her to Dr. Rennis GoldenHilty for further evaluation of her lipid abnormalities.

## 2017-01-06 NOTE — Assessment & Plan Note (Signed)
History of persistent hypertension blood pressure measured at 211/96. She is in amlodipine, lisinopril and Bystolic . Renal Doppler studies performed 11/25/16 did not suggest renal artery stenosis. She has been compliant with her medications. I'm going to titrate her current medications, however keep a blood pressure log. She will see Belenda CruiseKristin in 2 weeks for review and titration. She may need the addition addition of hydralazine at that time.

## 2017-01-06 NOTE — Progress Notes (Signed)
01/06/2017 Kelly Romero   1965/12/05  [id number]  Primary Physician Jeanie Sewer Valrie Hart, MD Primary Cardiologist: Runell Gess MD Nicholes Calamity, [place]  HPI:  Kelly Romero is a 51 y.o. female moderately overweight married Caucasian female mother of one child who is accompanied by her son-in-law Maisie Fus today. I last saw her in the office 11/06/16. She was referred by Dr. Everlena Cooper for cardiovascular evaluation because of recurrent strokes. He really wanted a loop recorder implanted. She has a history of true hypertension, diabetes and hyperlipidemia. She does not smoke. There is no family history. She's had multiple strokes but never has had a myocardial infarction. She has had extensive neurologic workup including CT, MRA, MRI, carotid Doppler studies and a transesophageal echo performed a year ago that showed no evidence of a PFO. She had an event monitor that showed no arrhythmias. She does get occasional chest pain several times a week with radiation to her jaw. She had a Myoview stress test performed 11/25/16 which was normal. Renal Doppler showed no evidence of renal artery stenosis. She continues to be hypertensive however.   Current Meds  Medication Sig  . amLODipine (NORVASC) 10 MG tablet Take 1 tablet (10 mg total) by mouth daily.  Marland Kitchen aspirin EC 325 MG EC tablet Take 1 tablet (325 mg total) by mouth daily.  . clopidogrel (PLAVIX) 75 MG tablet Take 1 tablet (75 mg total) by mouth daily.  . divalproex (DEPAKOTE) 500 MG DR tablet Take 1 tablet (500 mg total) by mouth 2 (two) times daily.  . Empagliflozin-Linagliptin (GLYXAMBI) 25-5 MG TABS Take 25 mg by mouth.  . escitalopram (LEXAPRO) 20 MG tablet Take 20 mg by mouth daily.  . Evolocumab (REPATHA SURECLICK) 140 MG/ML SOAJ Inject 140 mg into the skin every 14 (fourteen) days.  Marland Kitchen ibuprofen (ADVIL,MOTRIN) 800 MG tablet Take 1 tablet (800 mg total) by mouth every 8 (eight) hours as needed.  Bess Harvest Ethyl (VASCEPA) 1 g CAPS  Take 2 g by mouth 2 (two) times daily.  . insulin detemir (LEVEMIR) 100 UNIT/ML injection Inject 85 Units into the skin every morning.   Marland Kitchen lisinopril (PRINIVIL,ZESTRIL) 30 MG tablet Take 1 tablet (30 mg total) by mouth daily.  . Magnesium Citrate 200 MG TABS Take 400 mg by mouth daily.  . nebivolol (BYSTOLIC) 5 MG tablet Take 1 tablet (5 mg total) by mouth daily.  . pantoprazole (PROTONIX) 40 MG tablet Take 40 mg by mouth daily.  . rosuvastatin (CRESTOR) 40 MG tablet Take 1 tablet (40 mg total) by mouth at bedtime.     Allergies  Allergen Reactions  . Erythromycin Itching  . Latex     itch    Social History   Social History  . Marital status: Married    Spouse name: N/A  . Number of children: N/A  . Years of education: N/A   Occupational History  . Data processing manager    Social History Main Topics  . Smoking status: Never Smoker  . Smokeless tobacco: Never Used  . Alcohol use No     Comment: rare   . Drug use: No  . Sexual activity: No   Other Topics Concern  . Not on file   Social History Narrative  . No narrative on file     Review of Systems: General: negative for chills, fever, night sweats or weight changes.  Cardiovascular: negative for chest pain, dyspnea on exertion, edema, orthopnea, palpitations, paroxysmal nocturnal dyspnea or shortness  of breath Dermatological: negative for rash Respiratory: negative for cough or wheezing Urologic: negative for hematuria Abdominal: negative for nausea, vomiting, diarrhea, bright red blood per rectum, melena, or hematemesis Neurologic: negative for visual changes, syncope, or dizziness All other systems reviewed and are otherwise negative except as noted above.    Blood pressure (!) 211/96, pulse 85, height 5\' 4"  (1.626 m), weight 242 lb 6.4 oz (110 kg).  General appearance: alert and no distress Neck: no adenopathy, no carotid bruit, no JVD, supple, symmetrical, trachea midline and thyroid not enlarged, symmetric, no  tenderness/mass/nodules Lungs: clear to auscultation bilaterally Heart: regular rate and rhythm, S1, S2 normal, no murmur, click, rub or gallop Extremities: extremities normal, atraumatic, no cyanosis or edema Pulses: 2+ and symmetric Skin: Skin color, texture, turgor normal. No rashes or lesions Neurologic: Alert and oriented X 3, normal strength and tone. Normal symmetric reflexes. Normal coordination and gait  EKG not performed today  ASSESSMENT AND PLAN:   Essential hypertension History of persistent hypertension blood pressure measured at 211/96. She is in amlodipine, lisinopril and Bystolic . Renal Doppler studies performed 11/25/16 did not suggest renal artery stenosis. She has been compliant with her medications. I'm going to titrate her current medications, however keep a blood pressure log. She will see Belenda CruiseKristin in 2 weeks for review and titration. She may need the addition addition of hydralazine at that time.  Hyperlipidemia History of hyperlipidemia on statin therapy with recent lipid profile performed 11/06/16 revealing an LDL 229, HDL of 50 and triglyceride level of 248 consistent with "metabolic syndrome". I'm going to refer her to Dr. Rennis GoldenHilty for further evaluation of her lipid abnormalities.   Chest pain History of chest pain which occur several times a week associated with jaw pain with recent negative Myoview stress test 11/25/16. I am going to get a coronary CTA to further evaluate      Runell GessJonathan J. Loany Neuroth MD [facility] LLCFACP,FACC,FAHA, [facility] HightsFSCAI 01/06/2017 10:39 AM

## 2017-01-06 NOTE — Patient Instructions (Addendum)
Medication Instructions: Your physician recommends that you continue on your current medications as directed. Please refer to the Current Medication list given to you today.  Increase Nebivolol (Bystolic) to 10 mg daily.  Increase Lisinopril to 40 mg daily.   Labwork: Your physician recommends that you return for lab work: BMET--prior to CTA   Testing/Procedures: Coronary CT Angiography (CTA), is a special type of CT scan that uses a computer to produce multi-dimensional views of major blood vessels throughout the body. In CT angiography, a contrast material is injected through an IV to help visualize the blood vessels   Follow-Up: Your physician recommends that you schedule a follow-up appointment in: 2 weeks with PharmD for BP Check.  Your physician has referred you to our Lipid Clinic--Dr. Hilty.   We request that you follow-up in: 3 months with an extender and in 6 months with Dr San MorelleBerry  You will receive a reminder letter in the mail two months in advance. If you don't receive a letter, please call our office to schedule the follow-up appointment.  If you need a refill on your cardiac medications before your next appointment, please call your pharmacy.

## 2017-01-07 ENCOUNTER — Other Ambulatory Visit: Payer: Medicaid Other

## 2017-01-07 ENCOUNTER — Encounter: Payer: Self-pay | Admitting: Neurology

## 2017-01-07 ENCOUNTER — Ambulatory Visit (INDEPENDENT_AMBULATORY_CARE_PROVIDER_SITE_OTHER): Payer: Medicaid Other | Admitting: Neurology

## 2017-01-07 VITALS — BP 170/80 | HR 85 | Ht 64.0 in | Wt 242.4 lb

## 2017-01-07 DIAGNOSIS — E785 Hyperlipidemia, unspecified: Secondary | ICD-10-CM

## 2017-01-07 DIAGNOSIS — G3184 Mild cognitive impairment, so stated: Secondary | ICD-10-CM

## 2017-01-07 DIAGNOSIS — I10 Essential (primary) hypertension: Secondary | ICD-10-CM

## 2017-01-07 DIAGNOSIS — G43009 Migraine without aura, not intractable, without status migrainosus: Secondary | ICD-10-CM

## 2017-01-07 DIAGNOSIS — H53469 Homonymous bilateral field defects, unspecified side: Secondary | ICD-10-CM | POA: Diagnosis not present

## 2017-01-07 DIAGNOSIS — Z794 Long term (current) use of insulin: Secondary | ICD-10-CM | POA: Diagnosis not present

## 2017-01-07 DIAGNOSIS — E118 Type 2 diabetes mellitus with unspecified complications: Secondary | ICD-10-CM | POA: Diagnosis not present

## 2017-01-07 DIAGNOSIS — I639 Cerebral infarction, unspecified: Secondary | ICD-10-CM

## 2017-01-07 DIAGNOSIS — Z79899 Other long term (current) drug therapy: Secondary | ICD-10-CM

## 2017-01-07 DIAGNOSIS — I69398 Other sequelae of cerebral infarction: Secondary | ICD-10-CM

## 2017-01-07 LAB — CBC WITH DIFFERENTIAL/PLATELET
BASOS PCT: 0.4 %
Basophils Absolute: 22 cells/uL (ref 0–200)
EOS PCT: 2 %
Eosinophils Absolute: 108 cells/uL (ref 15–500)
HCT: 33.8 % — ABNORMAL LOW (ref 35.0–45.0)
Hemoglobin: 10.9 g/dL — ABNORMAL LOW (ref 11.7–15.5)
Lymphs Abs: 1609 cells/uL (ref 850–3900)
MCH: 27.3 pg (ref 27.0–33.0)
MCHC: 32.2 g/dL (ref 32.0–36.0)
MCV: 84.5 fL (ref 80.0–100.0)
MONOS PCT: 5.9 %
MPV: 10.3 fL (ref 7.5–12.5)
NEUTROS PCT: 61.9 %
Neutro Abs: 3343 cells/uL (ref 1500–7800)
PLATELETS: 274 10*3/uL (ref 140–400)
RBC: 4 10*6/uL (ref 3.80–5.10)
RDW: 12.8 % (ref 11.0–15.0)
TOTAL LYMPHOCYTE: 29.8 %
WBC: 5.4 10*3/uL (ref 3.8–10.8)
WBCMIX: 319 {cells}/uL (ref 200–950)

## 2017-01-07 LAB — TSH: TSH: 3.13 m[IU]/L

## 2017-01-07 LAB — T4, FREE: FREE T4: 1.2 ng/dL (ref 0.8–1.8)

## 2017-01-07 NOTE — Progress Notes (Addendum)
NEUROLOGY FOLLOW UP OFFICE NOTE  Kelly Romero [id number]  HISTORY OF PRESENT ILLNESS: Kelly Romero is a 51 year old right-handed female with diabetes, hypertension and depression, headache and prior strokes who presents for recent stroke.     I  HEADACHES: Update: Headaches improved.  She sometimes has right jaw pain. Intensity:  6-7/10 Duration: eases within an hour of taking ibuprofen or Tylenol Frequency:  Every other day Current NSAIDS:  no Current analgesics:  Ibuprofen, Tylenol Current triptans:  no Current anti-emetic:  no Current muscle relaxants:  no Current anti-anxiolytic:  no Current sleep aide:  no Current Antihypertensive medications:  lisinopril Current Antidepressant medications:  escitalopram 10mg  Current Anticonvulsant medications:  divalproex DR 500mg  twice daily Current Vitamins/Herbal/Supplements:  Magnesium citrate 400mg  Current Antihistamines/Decongestants:  no Other therapy:  no  11/06/16 hepatic panel:  t bili 0.3, ALP 54, AST 17, ALT 10  History: Onset:  July 2016.  Sudden onset. Location:  Right sided, from front to back of head.  Initially had pain behind right ear. Quality:  squeezing Initial intensity:  7-8/10 Aura:  no Prodrome:  no Associated symptoms:  Some photophobia and phonophobia.  Sometimes nausea.  She denies visual disturbance. Initial Duration:  All day Initial Frequency:  Daily.  Sometimes wakes her up at night Triggers/exacerbating factors:  Loud noise Relieving factors:  Laying in a cool dark quiet room   Past abortive medication:  Tylenol, Excedrin, Percocet, ibuprofen 800mg  Past preventative medication:  amitriptyline 25mg  Other past therapy:  none    Sed Rate was 30.  MRI of the brain from 11/09/14 showed chronic small vessel ischemic changes with an incidental tiny subacute infarct in the right frontal lobe.     II  STROKE/ENCEPHALOPATHY: Update: She is taking ASA 325mg  and Plavix 75mg  daily for secondary  stroke prevention.  She is taking atorvastatin 80mg  daily.   She was referred to cardiology for evaluation and possible implantable loop recorder.  However, attention needed to be focused on that.  Lipid panel from 11/06/16 demonstrated total cholesterol 329, TG 248, HDL 50 and LDL 229.  She was switched to rosuvastatin 40mg .  She had a nuclear stress test on 11/26/16 which was normal with EF 55-65%.  For uncontrolled hypertension, she had a renal ultrasound on 11/25/16, which was normal.  Sometimes she feels dizzy and off-balance.  She had one fall since last visit.  Memory is still an issue.  History: She was in [facility] on 12/07/14 for stroke symptoms.  She developed slurred speech and right arm numbness.   She was agitated, yelling and screaming in the ED.  She was given Ativan and Haldol.  CT of head was unremarkable.  Repeat CT of head two days later was negative.  CXR negative.  Urine tox screen was positive for opioids.  She was found to have severally elevated blood pressure with systolic at 240 and higher.  Hgb A1c was 12 with serum glucose level of 400.  She did not have fever or leukocytosis, and UA was borderline with trace leukocytes but negative nitrite.  She was treated with 5 days of IV Rocephin.  Troponins were negative.  Etiology was unknown but TIA was suspected as a possibility.   She had a similar stroke-like episode on 02/22/15.  She woke up with headache and later developed slurred speech.  She was admitted to [facility], where MRI of brain revealed 2 mm left occipital subacute infarct.  2D echo was performed, which appeared unremarkable,  but didn't make mention of a PFO.  Carotid doppler again showed no hemodynamically significant ICA stenosis.  Hypercoagulable panel, ANA, lupus panel, antiextractable nuclear antigen were negative.  LDL was 130.  Plavix was added to her ASA.  Crestor was increased to 40mg  daily.  Labs from August 2016 include Sed Rate 30, ANA negative,  ANCA screen negative, antiextractable nuclear antigen (RNP abs, Smith abs) negative.  She had a TEE on 04/19/15, which revealed EF 60-65% with no PFO or atrial septal defect or other cardiac source of emboli.  After 3 months on dual antiplatelet therapy, she was continued on Plavix alone.  She was on amitriptyline 25mg  at bedtime to help with depression and headache.   She was admitted to [facility] between 08/15/15 and 08/19/15 for right-sided (however some notes state left-sided) weakness and numbness associated with forgetfulness and agitation.  Blood pressure was elevated.  NIHSS was 4.  Due to minimal symptoms, she did not receive tPA.  Urine tox screen was negative.  CT of head showed no acute findings.  MRI of brain with and without contrast revealed possible small acute infarct in the posterior right MCA territory.  However, it appears on previous imaging, which suggests it is likely an old finding.  Neurology believed it to be an old infarct, as it apparently was seen on a prior MRI in February 2017.  TTE again was unremarkable with EF of 65-70%.  LDL was 231.  Hgb A1c was 10.5.  Exact cause of encephalopathy was unclear.  She underwent an EEG on 08/18/15, which revealed mild to moderate generalized background slowing but no epileptiform activity.  Neurology felt symptoms may be psychogenic.  She was started on Depakote 500mg  twice daily for possible seizures.    She was maintained on ASA 81mg  and Plavix, as well as Rosuvastatin 20mg .  She reports word-finding difficulties, which has been ongoing for several months.   She was readmitted to [facility] from 12/26/15 to 12/22/2015 for altered mental status, fever of 103 and elevated blood pressure with systolic in 220s.  MRI of brain revealed small acute and subacute infarcts in the bilateral cerebral hemisphere.  Due to fever, she was started on IV antibiotics and acyclovir.  UA and urine tox screen was negative.  Blood cultures showed 1 of 2 positive for gram +,  likely contaminated.  Endocarditis was not suspected.  She underwent LP.  CSF cell count was 1, gram stain negative, protein 49, glucose 98, oligoclonal bands negative, fungal cultures, CMV, VZV, VDRL and HSV were negative.  In the serum, HIV and RPR were nonreactive.  Arbovirus panel was negative.  ACE was 26.  Hypercoagulable panel (including homocysteine, lupus anticoagulant, beta-2-glycoprotein, cardiolipin antibodies) was negative.  TTE showed normal LVEF 55-60% and TCD showed clinically insignificant right to left shunt with valsalva only.  Cerebral angiogram revealed mild athersclerosis with 65-70% stenosis of left MCA superior division, severe stenosis of proximal PCA P1 segment and tapered stenosis of distal right pericallosal artery.  Hgb A1c was 13.  LDL was 153.  Lower extremity doppler revealed distal DVT in the right lower extremity.  Plavix was switched to ASA 325mg  daily.  DVT is being monitored and managed by her PCP.  Recommendation was to recheck venous ultrasound every 2 weeks and if there is any proximal extension, then the switch to anticoagulation.  30 day Holter monitor from late 2017 revealed no arrhythmia.  She was admitted to [facility] from on 10/02/16 for headache, dizziness and  hypertension.  CT of head from 09/30/16 showed a subacute to chronic infarct in the left parietooccipital junction.  CTA of head from 10/01/16  demonstrated high-grade stenosis or occlusion at the left P2 segment bifurcation with nonvisualization of the superior left P3 segment. MRI of brain without contrast from 10/02/16 demonstrated a small acute infarct in the right body of the corpus callosum.  Carotid doppler showed moderate heterogeneous and calcified plaque without hemodynamically significant stenosis.  2D echo showed EF 60-65% with no PFO or other cardiac source of emboli.  Telemetry revealed no atrial fibrillation.  LDL was reportedly 239.  Rosuvastatin 40mg  was changed to atorvastatin 80mg .  She  was maintained on ASA 325mg  and Plavix 75mg .  She was also treated for UTI.   III Cognitive impairment/aphasia: She underwent neuropsychological testing on 03/10/16.  She exhibited adjustment disorder with depression and anxiety.  However, a cognitive diagnosis could not be made due to poor effort on testing.  It was recommended to continue treating anxiety and depression.  PAST MEDICAL HISTORY: Past Medical History:  Diagnosis Date  . Diabetes mellitus without complication (HCC)   . Headache   . Hyperlipidemia   . Hypertension   . TIA (transient ischemic attack)     MEDICATIONS: Current Outpatient Prescriptions on File Prior to Visit  Medication Sig Dispense Refill  . amLODipine (NORVASC) 10 MG tablet Take 1 tablet (10 mg total) by mouth daily. 30 tablet 5  . aspirin EC 325 MG EC tablet Take 1 tablet (325 mg total) by mouth daily. 30 tablet 1  . clopidogrel (PLAVIX) 75 MG tablet Take 1 tablet (75 mg total) by mouth daily. 30 tablet 11  . divalproex (DEPAKOTE) 500 MG DR tablet Take 1 tablet (500 mg total) by mouth 2 (two) times daily. 60 tablet 3  . Empagliflozin-Linagliptin (GLYXAMBI) 25-5 MG TABS Take 25 mg by mouth.    . escitalopram (LEXAPRO) 20 MG tablet Take 20 mg by mouth daily.    . Evolocumab (REPATHA SURECLICK) 140 MG/ML SOAJ Inject 140 mg into the skin every 14 (fourteen) days. 2 pen 11  . ibuprofen (ADVIL,MOTRIN) 800 MG tablet Take 1 tablet (800 mg total) by mouth every 8 (eight) hours as needed. 30 tablet 2  . Icosapent Ethyl (VASCEPA) 1 g CAPS Take 2 g by mouth 2 (two) times daily. 120 capsule 5  . insulin detemir (LEVEMIR) 100 UNIT/ML injection Inject 85 Units into the skin every morning.     Marland Kitchen lisinopril (PRINIVIL,ZESTRIL) 40 MG tablet Take 1 tablet (40 mg total) by mouth daily. 30 tablet 3  . Magnesium Citrate 200 MG TABS Take 400 mg by mouth daily. 30 tablet 2  . nebivolol (BYSTOLIC) 10 MG tablet Take 1 tablet (10 mg total) by mouth daily. 30 tablet 3  . pantoprazole  (PROTONIX) 40 MG tablet Take 40 mg by mouth daily.    . rosuvastatin (CRESTOR) 40 MG tablet Take 1 tablet (40 mg total) by mouth at bedtime. 30 tablet 3   No current facility-administered medications on file prior to visit.     ALLERGIES: Allergies  Allergen Reactions  . Erythromycin Itching  . Latex     itch    FAMILY HISTORY: Family History  Problem Relation Age of Onset  . Stroke Father        23s  . Heart attack Father 79  . COPD Mother   . Heart failure Brother   . Cancer Maternal Grandmother        unknown   .  COPD Unknown   . Heart failure Maternal Grandfather   . Hypertension Maternal Grandfather   . Cancer Paternal Grandfather        lung  . Diabetes Paternal Grandmother     SOCIAL HISTORY: Social History   Social History  . Marital status: Married    Spouse name: N/A  . Number of children: N/A  . Years of education: N/A   Occupational History  . Data processing manager    Social History Main Topics  . Smoking status: Never Smoker  . Smokeless tobacco: Never Used  . Alcohol use No     Comment: rare   . Drug use: No  . Sexual activity: No   Other Topics Concern  . Not on file   Social History Narrative  . No narrative on file    REVIEW OF SYSTEMS: Constitutional: No fevers, chills, or sweats, no generalized fatigue, change in appetite Eyes: No visual changes, double vision, eye pain Ear, nose and throat: No hearing loss, ear pain, nasal congestion, sore throat Cardiovascular: No chest pain, palpitations Respiratory:  No shortness of breath at rest or with exertion, wheezes GastrointestinaI: No nausea, vomiting, diarrhea, abdominal pain, fecal incontinence Genitourinary:  No dysuria, urinary retention or frequency Musculoskeletal:  No neck pain, back pain Integumentary: No rash, pruritus, skin lesions Neurological: as above Psychiatric: depression and anxiety Endocrine: No palpitations, diaphoresis, mood swings, change in appetite, change in  weight, increased thirst Hematologic/Lymphatic:  No purpura, petechiae. Allergic/Immunologic: no itchy/runny eyes, nasal congestion, recent allergic reactions, rashes  PHYSICAL EXAM: Vitals:   01/07/17 0727  BP: (!) 170/80  Pulse: 85  SpO2: 98%   General: No acute distress.  Patient appears well-groomed.  Morbidly obese body habitus. Head:  Normocephalic/atraumatic Eyes:  Fundi examined but not visualized Neck: supple, no paraspinal tenderness, full range of motion Heart:  Regular rate and rhythm Lungs:  Clear to auscultation bilaterally Back: No paraspinal tenderness Neurological Exam: alert and oriented to person, place, and time. Attention span and concentration intact, delayed recall reduced, remote memory intact, fund of knowledge intact.  Speech slow but fluent.  Naming fluency slow but improved.  Able to name and repeat.  No dysarthria.  Residual right homonymous hemianopsia and flattening of right nasolabial fold.  Otherwise, CN II-XII intact. Bulk and tone normal, muscle strength 5/5 throughout.  Sensation to light touch  intact.  Deep tendon reflexes 2+ throughout.  Finger to nose testing intact.  Gait mildly wide based, Romberg negative.  IMPRESSION: 1.  Recurrent strokes, cryptogenic.  Residual cognitive impairment and right homonymous hemianopsia.  She has history of diabetes, hypertension and hyperlipidemia which need to be optimized.  However, other stroke workup has been unremarkable (including TTE/TEE, 30 day Holter monitor, carotid doppler and hypercoagulable panel).  She had a DVT but no PFO. 2.  Vascular cognitive impairment. 3.  Migraine and tension type headache 4.  Hypertension 5.  Hyperlipidemia 6.  Morbid obesity (BMI 41.60) 7.  Depression and anxiety  PLAN: 1.  Continue aspirin and plavix 2.  Continue cholesterol medication as per Dr. Allyson Sabal 3.  Optimize blood pressure with Dr. Allyson Sabal.  Follow up with him for recheck. 4.  If possible, do not take ibuprofen due  to increased risk of bleeding (already on ASA and Plavix).  Take only Tylenol for headache, but no more than 2 days out of the week to prevent rebound headache 5.  Continue Depakote 500mg  twice daily.  Check CBC and TSH, free T4. Your provider has requested  that you have labwork completed today. Please go to [facility] ([id number]) on the second floor of this building before leaving the office today. You do not need to check in. If you are not called within 15 minutes please check with the front desk.  6.  In addition to magnesium 400mg  daily, take riboflavin 400mg  daily and coenzyme Q10 100mg  three times daily for headaches. 7.  Weight loss, exercise (walks) 8.  Follow up in 4 months.  25 minutes spent face to face with patient, over 50% spent discussing management.  Shon MilletAdam Jaffe, DO  CC: Gwendlyn DeutscherJohn [name] II, MD

## 2017-01-07 NOTE — Patient Instructions (Addendum)
1.  Continue aspirin and plavix 2.  Continue cholesterol medication 3.  Optimize blood pressure with Dr. Allyson SabalBerry 4.  Do not take ibuprofen.  Take only Tylenol for headache, but no more than 2 days out of the week 5.  Continue Depakote 500mg  twice daily.  Check CBC and TSH, free T4. Your provider has requested that you have labwork completed today. Please go to [facility] ([address]) on the second floor of this building before leaving the office today. You do not need to check in. If you are not called within 15 minutes please check with the front desk.  6.  In addition to magnesium 400mg  daily, take riboflavin 400mg  daily and coenzyme Q10 100mg  three times daily for headaches. (over the counter or from [employer]) 7.  Follow up in 4 months.

## 2017-01-14 ENCOUNTER — Ambulatory Visit: Payer: 59 | Admitting: Neurology

## 2017-01-26 DIAGNOSIS — I10 Essential (primary) hypertension: Secondary | ICD-10-CM | POA: Diagnosis not present

## 2017-01-26 DIAGNOSIS — J189 Pneumonia, unspecified organism: Secondary | ICD-10-CM

## 2017-01-26 DIAGNOSIS — E1165 Type 2 diabetes mellitus with hyperglycemia: Secondary | ICD-10-CM | POA: Diagnosis not present

## 2017-01-26 DIAGNOSIS — I639 Cerebral infarction, unspecified: Secondary | ICD-10-CM

## 2017-01-26 DIAGNOSIS — R739 Hyperglycemia, unspecified: Secondary | ICD-10-CM

## 2017-01-26 DIAGNOSIS — R531 Weakness: Secondary | ICD-10-CM | POA: Diagnosis not present

## 2017-01-26 DIAGNOSIS — E785 Hyperlipidemia, unspecified: Secondary | ICD-10-CM | POA: Diagnosis not present

## 2017-01-27 DIAGNOSIS — I10 Essential (primary) hypertension: Secondary | ICD-10-CM | POA: Diagnosis not present

## 2017-01-27 DIAGNOSIS — E785 Hyperlipidemia, unspecified: Secondary | ICD-10-CM | POA: Diagnosis not present

## 2017-01-27 DIAGNOSIS — R739 Hyperglycemia, unspecified: Secondary | ICD-10-CM | POA: Diagnosis not present

## 2017-01-27 DIAGNOSIS — R531 Weakness: Secondary | ICD-10-CM | POA: Diagnosis not present

## 2017-01-27 DIAGNOSIS — I639 Cerebral infarction, unspecified: Secondary | ICD-10-CM | POA: Diagnosis not present

## 2017-01-27 DIAGNOSIS — J189 Pneumonia, unspecified organism: Secondary | ICD-10-CM | POA: Diagnosis not present

## 2017-01-27 DIAGNOSIS — E1165 Type 2 diabetes mellitus with hyperglycemia: Secondary | ICD-10-CM | POA: Diagnosis not present

## 2017-01-28 DIAGNOSIS — J189 Pneumonia, unspecified organism: Secondary | ICD-10-CM | POA: Diagnosis not present

## 2017-01-28 DIAGNOSIS — I639 Cerebral infarction, unspecified: Secondary | ICD-10-CM | POA: Diagnosis not present

## 2017-01-28 DIAGNOSIS — R739 Hyperglycemia, unspecified: Secondary | ICD-10-CM | POA: Diagnosis not present

## 2017-01-28 DIAGNOSIS — R531 Weakness: Secondary | ICD-10-CM | POA: Diagnosis not present

## 2017-01-29 DIAGNOSIS — J189 Pneumonia, unspecified organism: Secondary | ICD-10-CM | POA: Diagnosis not present

## 2017-01-29 DIAGNOSIS — I639 Cerebral infarction, unspecified: Secondary | ICD-10-CM | POA: Diagnosis not present

## 2017-01-29 DIAGNOSIS — R531 Weakness: Secondary | ICD-10-CM | POA: Diagnosis not present

## 2017-01-29 DIAGNOSIS — R739 Hyperglycemia, unspecified: Secondary | ICD-10-CM | POA: Diagnosis not present

## 2017-02-01 ENCOUNTER — Telehealth: Payer: Self-pay | Admitting: Pharmacist

## 2017-02-01 NOTE — Telephone Encounter (Signed)
LMOM; patient to call back. Need to know if patient tolerating Repatha and Vascepa.   Also wants to follow up BP home monitoring and medication compliance.

## 2017-02-02 NOTE — Telephone Encounter (Signed)
2nd attempt to reach patient for Repatha and HTN f/u.  No response. Left message .

## 2017-02-11 ENCOUNTER — Encounter: Payer: Self-pay | Admitting: Cardiovascular Disease

## 2017-03-01 ENCOUNTER — Emergency Department
Admission: EM | Admit: 2017-03-01 | Discharge: 2017-03-02 | Disposition: A | Discharge status: Home / Self Care | Payer: Medicaid Other | Attending: Emergency Medicine | Admitting: Emergency Medicine

## 2017-03-01 ENCOUNTER — Encounter: Payer: Self-pay | Admitting: Emergency Medicine

## 2017-03-01 ENCOUNTER — Emergency Department: Payer: Medicaid Other

## 2017-03-01 ENCOUNTER — Other Ambulatory Visit: Payer: Self-pay

## 2017-03-01 DIAGNOSIS — E1140 Type 2 diabetes mellitus with diabetic neuropathy, unspecified: Secondary | ICD-10-CM | POA: Diagnosis not present

## 2017-03-01 DIAGNOSIS — R4781 Slurred speech: Secondary | ICD-10-CM | POA: Diagnosis not present

## 2017-03-01 DIAGNOSIS — R739 Hyperglycemia, unspecified: Secondary | ICD-10-CM

## 2017-03-01 DIAGNOSIS — I10 Essential (primary) hypertension: Secondary | ICD-10-CM | POA: Diagnosis not present

## 2017-03-01 DIAGNOSIS — Z8673 Personal history of transient ischemic attack (TIA), and cerebral infarction without residual deficits: Secondary | ICD-10-CM | POA: Insufficient documentation

## 2017-03-01 DIAGNOSIS — Z79899 Other long term (current) drug therapy: Secondary | ICD-10-CM | POA: Diagnosis not present

## 2017-03-01 DIAGNOSIS — E1165 Type 2 diabetes mellitus with hyperglycemia: Secondary | ICD-10-CM | POA: Insufficient documentation

## 2017-03-01 DIAGNOSIS — Z794 Long term (current) use of insulin: Secondary | ICD-10-CM | POA: Diagnosis not present

## 2017-03-01 DIAGNOSIS — Z7902 Long term (current) use of antithrombotics/antiplatelets: Secondary | ICD-10-CM | POA: Insufficient documentation

## 2017-03-01 DIAGNOSIS — R079 Chest pain, unspecified: Secondary | ICD-10-CM | POA: Diagnosis not present

## 2017-03-01 DIAGNOSIS — Z91040 Latex allergy status: Secondary | ICD-10-CM | POA: Diagnosis not present

## 2017-03-01 DIAGNOSIS — Z7982 Long term (current) use of aspirin: Secondary | ICD-10-CM | POA: Diagnosis not present

## 2017-03-01 LAB — BASIC METABOLIC PANEL
ANION GAP: 7 (ref 5–15)
BUN: 15 mg/dL (ref 6–20)
CHLORIDE: 94 mmol/L — AB (ref 101–111)
CO2: 31 mmol/L (ref 22–32)
CREATININE: 1.17 mg/dL — AB (ref 0.44–1.00)
Calcium: 8.9 mg/dL (ref 8.9–10.3)
GFR calc non Af Amer: 52 mL/min — ABNORMAL LOW (ref >60)
Glucose, Bld: 500 mg/dL — ABNORMAL HIGH (ref 65–99)
POTASSIUM: 3 mmol/L — AB (ref 3.5–5.1)
Sodium: 132 mmol/L — ABNORMAL LOW (ref 135–145)

## 2017-03-01 LAB — CBC
HEMATOCRIT: 35.8 % (ref 35.0–47.0)
HEMOGLOBIN: 11.8 g/dL — AB (ref 12.0–16.0)
MCH: 28.2 pg (ref 26.0–34.0)
MCHC: 33 g/dL (ref 32.0–36.0)
MCV: 85.4 fL (ref 80.0–100.0)
Platelets: 273 10*3/uL (ref 150–440)
RBC: 4.19 MIL/uL (ref 3.80–5.20)
RDW: 14.4 % (ref 11.5–14.5)
WBC: 4 10*3/uL (ref 3.6–11.0)

## 2017-03-01 LAB — TROPONIN I: Troponin I: 0.03 ng/mL (ref <0.03)

## 2017-03-01 MED ORDER — SODIUM CHLORIDE 0.9 % IV BOLUS (SEPSIS)
1000.0000 mL | Freq: Once | INTRAVENOUS | Status: AC
  Administered 2017-03-02: 1000 mL via INTRAVENOUS

## 2017-03-01 NOTE — ED Triage Notes (Signed)
Per ems slurred speech since 3p but no other deficits. noncompiant with meds with elevated blood sugar and blood pressure.

## 2017-03-01 NOTE — ED Triage Notes (Signed)
Pt to triage via w/c; st since 3pm having mid CP, nonradiating, accomp by some slurred speech; st hx CVA ; no slurred speech noted at present; MAEW, A&Ox3

## 2017-03-02 ENCOUNTER — Emergency Department: Payer: Medicaid Other

## 2017-03-02 LAB — GLUCOSE, CAPILLARY
GLUCOSE-CAPILLARY: 437 mg/dL — AB (ref 65–99)
GLUCOSE-CAPILLARY: 371 mg/dL — AB (ref 65–99)

## 2017-03-02 LAB — TROPONIN I

## 2017-03-02 MED ORDER — NITROGLYCERIN 0.4 MG SL SUBL
0.4000 mg | SUBLINGUAL_TABLET | SUBLINGUAL | Status: DC | PRN
  Administered 2017-03-02: 0.4 mg via SUBLINGUAL
  Filled 2017-03-02: qty 1

## 2017-03-02 MED ORDER — INSULIN ASPART 100 UNIT/ML ~~LOC~~ SOLN
10.0000 [IU] | Freq: Once | SUBCUTANEOUS | Status: AC
  Administered 2017-03-02: 10 [IU] via INTRAVENOUS
  Filled 2017-03-02: qty 1

## 2017-03-02 NOTE — Discharge Instructions (Signed)
Please Follow-up with your primary care physician.

## 2017-03-02 NOTE — ED Notes (Signed)
Pt went to CT

## 2017-03-02 NOTE — ED Notes (Signed)
Waiting on insulin from pharmacy.  

## 2017-03-02 NOTE — ED Provider Notes (Signed)
[facility] Emergency Department Provider Note   ____________________________________________   First MD Initiated Contact with Patient 03/01/17 2351     (approximate)  I have reviewed the triage vital signs and the nursing notes.   HISTORY  Chief Complaint Chest Pain    HPI Kelly Romero is a 51 y.o. female who comes into the hospital today with some slurred speech earlier.  She reports that it started around 3 or 4.  The patient states that she has had a hard time with her memory because she has had multiple strokes in the past.  She is also had some chest pain but is better now.  She has been having this for about 2-3 days and is been coming and going.  Currently her pain is a 4 out of 10 in intensity.  The patient has not been taking her medications at home.  She reports that she is depressed because she takes so much medication every day.  The patient says she has mid chest pain with no radiation.  She had some right arm pain but it is gone.  She has not taken her meds for about 1-2 weeks.  She says some swelling in her legs and some mild shortness of breath with nausea.  She is here today for evaluation.   Past Medical History:  Diagnosis Date  . Diabetes mellitus without complication (HCC)   . Headache   . Hyperlipidemia   . Hypertension   . TIA (transient ischemic attack)     Patient Active Problem List   Diagnosis Date Noted  . Chest pain 11/06/2016  . 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 Aphasia as late effect of stroke 04/10/2016  . Encephalopathy   . Cerebral thrombosis with cerebral infarction 12/29/2015  . Hyperglycemia   . Hyperlipidemia   . Hypertensive emergency   . Deep venous thrombosis (HCC)   . AKI (acute kidney injury) (HCC) 12/26/2015  . Altered mental status   . Stroke-like symptoms   . Acute encephalopathy 08/17/2015  . Anxiety and depression   . CVA (cerebrovascular accident  due to intracerebral hemorrhage) (HCC) 08/15/2015  . Stroke (HCC)   . Encephalopathy, metabolic 12/28/2014  . 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 Essential hypertension 12/28/2014  . New onset headache 11/02/2014    Past Surgical History:  Procedure Laterality Date  . APPENDECTOMY    . CHOLECYSTECTOMY    . FOOT GANGLION EXCISION    . HERNIA REPAIR    . IR GENERIC HISTORICAL  12/30/2015   IR ANGIO INTRA EXTRACRAN SEL COM CAROTID INNOMINATE BILAT MOD SED 12/30/2015 Julieanne CottonSanjeev Deveshwar, MD [facility]  . IR GENERIC HISTORICAL  12/30/2015   IR ANGIO VERTEBRAL SEL VERTEBRAL BILAT MOD SED 12/30/2015 Julieanne CottonSanjeev Deveshwar, MD [facility]  . TEE WITHOUT CARDIOVERSION N/A 04/19/2015   Procedure: TRANSESOPHAGEAL ECHOCARDIOGRAM (TEE);  Surgeon: Jake BatheMark C Skains, MD;  Location: [facility] ENDOSCOPY;  Service: Cardiovascular;  Laterality: N/A;  . VAGINAL HYSTERECTOMY      Prior to Admission medications   Medication Sig Start Date End Date Taking? Authorizing Provider  amLODipine (NORVASC) 10 MG tablet Take 1 tablet (10 mg total) by mouth daily. 12/09/16 03/09/17  Runell GessBerry, Jonathan J, MD  aspirin EC 325 MG EC tablet Take 1 tablet (325 mg total) by mouth daily. 01/02/16   Reymundo PollGuilloud, Carolyn, MD  clopidogrel (PLAVIX) 75 MG tablet Take 1 tablet (75 mg total) by mouth daily. 01/15/16  Everlena Cooper, Adam R, DO  divalproex (DEPAKOTE) 500 MG DR tablet Take 1 tablet (500 mg total) by mouth 2 (two) times daily. 01/01/16   Reymundo Poll, MD  Empagliflozin-Linagliptin (GLYXAMBI) 25-5 MG TABS Take 25 mg by mouth.    [provider]  escitalopram (LEXAPRO) 20 MG tablet Take 20 mg by mouth daily.    [provider]  Evolocumab (REPATHA SURECLICK) 140 MG/ML SOAJ Inject 140 mg into the skin every 14 (fourteen) days. 12/09/16   Runell Gess, MD  ibuprofen (ADVIL,MOTRIN) 800 MG tablet Take 1 tablet (800 mg total) by mouth every 8 (eight) hours as needed. 09/04/15    [name], DO  Icosapent Ethyl (VASCEPA) 1 g CAPS Take 2 g by mouth 2 (two) times daily. 12/09/16   Runell Gess, MD  insulin detemir (LEVEMIR) 100 UNIT/ML injection Inject 85 Units into the skin every morning.     [provider]  lisinopril (PRINIVIL,ZESTRIL) 40 MG tablet Take 1 tablet (40 mg total) by mouth daily. 01/06/17   Runell Gess, MD  Magnesium Citrate 200 MG TABS Take 400 mg by mouth daily. 10/07/16   [name], DO  nebivolol (BYSTOLIC) 10 MG tablet Take 1 tablet (10 mg total) by mouth daily. 01/06/17   Runell Gess, MD  pantoprazole (PROTONIX) 40 MG tablet Take 40 mg by mouth daily.    [provider]  rosuvastatin (CRESTOR) 40 MG tablet Take 1 tablet (40 mg total) by mouth at bedtime. 01/01/16   Reymundo Poll, MD    Allergies Erythromycin and Latex  Family History  Problem Relation Age of Onset  . Stroke Father        21s  . Heart attack Father 10  . COPD Mother   . Heart failure Brother   . Cancer Maternal Grandmother        unknown   . COPD Unknown   . Heart failure Maternal Grandfather   . Hypertension Maternal Grandfather   . Cancer Paternal Grandfather        lung  . Diabetes Paternal Grandmother     Social History Social History   Tobacco Use  . Smoking status: Never Smoker  . Smokeless tobacco: Never Used  Substance Use Topics  . Alcohol use: No    Alcohol/week: 0.0 oz    Comment: rare   . Drug use: No    Review of Systems  Constitutional: No fever/chills Eyes: No visual changes. ENT: No sore throat. Cardiovascular:  chest pain. Respiratory: Denies shortness of breath. Gastrointestinal: No abdominal pain.  No nausea, no vomiting.  No diarrhea.  No constipation. Genitourinary: Negative for dysuria. Musculoskeletal: Negative for back pain. Skin: Negative for rash. Neurological: Slurred speech   ____________________________________________   PHYSICAL EXAM:  VITAL SIGNS: ED Triage Vitals  Enc  Vitals Group     BP 03/01/17 2003 (!) 165/76     Pulse Rate 03/01/17 2003 87     Resp 03/01/17 2003 20     Temp 03/01/17 2003 97.7 F (36.5 C)     Temp Source 03/01/17 2003 Oral     SpO2 03/01/17 2003 97 %     Weight 03/01/17 1959 230 lb (104.3 kg)     Height 03/01/17 1959 5\' 4"  (1.626 m)     Head Circumference --      Peak Flow --      Pain Score 03/01/17 1959 6     Pain Loc --      Pain  Edu? --      Excl. in GC? --     Constitutional: Alert and oriented. Well appearing and in mild distress. Eyes: Conjunctivae are normal. PERRL. EOMI. Head: Atraumatic. Nose: No congestion/rhinnorhea. Mouth/Throat: Mucous membranes are moist.  Oropharynx non-erythematous. Cardiovascular: Normal rate, regular rhythm. Grossly normal heart sounds.  Good peripheral circulation. Respiratory: Normal respiratory effort.  No retractions. Lungs CTAB. Gastrointestinal: Soft and nontender. No distention.  Positive bowel sounds Musculoskeletal: No lower extremity tenderness nor edema.  Neurologic:  Normal speech and language.  Cranial nerves II through XII are grossly intact with no focal motor neuro deficit Skin:  Skin is warm, dry and intact.  Psychiatric: Mood and affect are normal.   ____________________________________________   LABS (all labs ordered are listed, but only abnormal results are displayed)  Labs Reviewed  BASIC METABOLIC PANEL - Abnormal; Notable for the following components:      Result Value   Sodium 132 (*)    Potassium 3.0 (*)    Chloride 94 (*)    Glucose, Bld 500 (*)    Creatinine, Ser 1.17 (*)    GFR calc non Af Amer 52 (*)    All other components within normal limits  CBC - Abnormal; Notable for the following components:   Hemoglobin 11.8 (*)    All other components within normal limits  TROPONIN I - Abnormal; Notable for the following components:   Troponin I 0.03 (*)    All other components within normal limits  GLUCOSE, CAPILLARY - Abnormal; Notable for the  following components:   Glucose-Capillary 437 (*)    All other components within normal limits  GLUCOSE, CAPILLARY - Abnormal; Notable for the following components:   Glucose-Capillary 371 (*)    All other components within normal limits  TROPONIN I  CBG MONITORING, ED   ____________________________________________  EKG  ED ECG REPORT I, Rebecka Apley, the attending physician, personally viewed and interpreted this ECG.   Date: 03/01/2017  EKG Time: 2000  Rate: 89  Rhythm: normal sinus rhythm  Axis: normal  Intervals:none  ST&T Change: normal  ____________________________________________  RADIOLOGY  Dg Chest 2 View  Result Date: 03/01/2017 CLINICAL DATA:  Chest pain EXAM: CHEST  2 VIEW COMPARISON:  01/27/2017 FINDINGS: The heart size and mediastinal contours are within normal limits. Both lungs are clear. The visualized skeletal structures are unremarkable. IMPRESSION: No active cardiopulmonary disease. Interval clearing of infiltrate on the right. Electronically Signed   By: Marlan Palau M.D.   On: 03/01/2017 20:42   Ct Head Wo Contrast  Result Date: 03/02/2017 CLINICAL DATA:  Slurred speech.  Hypertension.  Hyperglycemia. EXAM: CT HEAD WITHOUT CONTRAST TECHNIQUE: Contiguous axial images were obtained from the base of the skull through the vertex without intravenous contrast. COMPARISON:  January 26, 2017 FINDINGS: Brain: The ventricles are normal in size and configuration. There is no intracranial mass hemorrhage, extra-axial fluid collection, or midline shift. There is evidence of a prior infarct in the medial posterior left parietal lobe. There is evidence of a prior infarct mid left occipital lobe. There is evidence of a prior small infarct in the posterior aspect of the thalamus as well as a prior infarct noted in the posterior left lentiform nucleus. There is evidence of a prior small infarct in the posterior right lentiform nucleus. There is evidence of a prior  infarct in the mid left centrum semiovale. There is mild small vessel disease in the centra semiovale bilaterally. No acute infarct is demonstrable. Vascular:  There is no hyperdense vessel. There is calcification in each carotid siphon region. Skull: Bony calvarium appears intact. Sinuses/Orbits: There is mucosal thickening in several ethmoid air cells. Other visualized paranasal sinuses are clear. Visualized orbits appear symmetric bilaterally. Other: Mastoid air cells are clear. IMPRESSION: There are several prior infarcts which appear stable. There is mild small vessel disease in the centra semiovale bilaterally. There is no mass or hemorrhage. There are foci of carotid artery calcification. There is mucosal thickening in several ethmoid air cells. Electronically Signed   By: Bretta BangWilliam  Woodruff III M.D.   On: 03/02/2017 01:09    ____________________________________________   PROCEDURES  Procedure(s) performed: None  Procedures  Critical Care performed: No  ____________________________________________   INITIAL IMPRESSION / ASSESSMENT AND PLAN / ED COURSE  As part of my medical decision making, I reviewed the following data within the electronic MEDICAL RECORD NUMBER Notes from prior ED visits and [place] Controlled Substance Database   This is a 51 year old female who comes into the hospital today with some slurred speech and some chest pain.  She has been having the slurred speech for multiple hours.  We did a CT scan which was unremarkable.  [name] blood sugar was elevated at 500 and her blood pressure was elevated as well.  I did give her a dose of nitroglycerin which helped bring down her blood pressure.  I initially gave her a liter of normal saline which only brought down her sugars to 437.  The patient also received some insulin which brought her sugars down to 371.  I plan to continue but the patient states that she is ready to go home.  Her symptoms are improved and she reports that  she has insulin at home that she can continue taking.  The patient reports that she will take her medicine and follow-up with her doctor.  She will be discharged home to follow-up.      ____________________________________________   FINAL CLINICAL IMPRESSION(S) / ED DIAGNOSES  Final diagnoses:  Nonspecific chest pain  Slurred speech  Hyperglycemia  Hypertension, unspecified type     ED Discharge Orders    None       Note:  This document was prepared using Dragon voice recognition software and may include unintentional dictation errors.    Rebecka ApleyWebster, Livian Vanderbeck P, MD 03/02/17 [phone]

## 2017-03-02 NOTE — ED Notes (Signed)
Assisted patient to the restroom.  

## 2017-03-08 ENCOUNTER — Other Ambulatory Visit: Payer: Self-pay | Admitting: *Deleted

## 2017-03-12 ENCOUNTER — Other Ambulatory Visit: Payer: Self-pay | Admitting: Pharmacist

## 2017-03-12 ENCOUNTER — Telehealth: Payer: Self-pay | Admitting: Pharmacist

## 2017-03-12 DIAGNOSIS — E785 Hyperlipidemia, unspecified: Secondary | ICD-10-CM

## 2017-03-12 NOTE — Telephone Encounter (Signed)
LMOM; patient due to repeat fasting lipid panel and LFT during 1st week of February. Order in epic. Copy mailed to patient.

## 2017-04-09 ENCOUNTER — Ambulatory Visit: Payer: Medicaid Other | Admitting: Cardiology

## 2017-04-27 DIAGNOSIS — G9341 Metabolic encephalopathy: Secondary | ICD-10-CM

## 2017-04-27 DIAGNOSIS — I63231 Cerebral infarction due to unspecified occlusion or stenosis of right carotid arteries: Secondary | ICD-10-CM

## 2017-04-27 DIAGNOSIS — R9431 Abnormal electrocardiogram [ECG] [EKG]: Secondary | ICD-10-CM | POA: Diagnosis not present

## 2017-04-27 DIAGNOSIS — G459 Transient cerebral ischemic attack, unspecified: Secondary | ICD-10-CM

## 2017-04-28 DIAGNOSIS — R9431 Abnormal electrocardiogram [ECG] [EKG]: Secondary | ICD-10-CM | POA: Diagnosis not present

## 2017-04-28 DIAGNOSIS — G459 Transient cerebral ischemic attack, unspecified: Secondary | ICD-10-CM | POA: Diagnosis not present

## 2017-04-28 DIAGNOSIS — G9341 Metabolic encephalopathy: Secondary | ICD-10-CM | POA: Diagnosis not present

## 2017-04-28 DIAGNOSIS — I6789 Other cerebrovascular disease: Secondary | ICD-10-CM

## 2017-04-28 DIAGNOSIS — I63231 Cerebral infarction due to unspecified occlusion or stenosis of right carotid arteries: Secondary | ICD-10-CM | POA: Diagnosis not present

## 2017-04-29 DIAGNOSIS — R9431 Abnormal electrocardiogram [ECG] [EKG]: Secondary | ICD-10-CM | POA: Diagnosis not present

## 2017-04-29 DIAGNOSIS — G9341 Metabolic encephalopathy: Secondary | ICD-10-CM | POA: Diagnosis not present

## 2017-04-29 DIAGNOSIS — G459 Transient cerebral ischemic attack, unspecified: Secondary | ICD-10-CM | POA: Diagnosis not present

## 2017-04-29 DIAGNOSIS — I63231 Cerebral infarction due to unspecified occlusion or stenosis of right carotid arteries: Secondary | ICD-10-CM | POA: Diagnosis not present

## 2017-04-30 DIAGNOSIS — G459 Transient cerebral ischemic attack, unspecified: Secondary | ICD-10-CM | POA: Diagnosis not present

## 2017-04-30 DIAGNOSIS — G9341 Metabolic encephalopathy: Secondary | ICD-10-CM | POA: Diagnosis not present

## 2017-04-30 DIAGNOSIS — R9431 Abnormal electrocardiogram [ECG] [EKG]: Secondary | ICD-10-CM | POA: Diagnosis not present

## 2017-04-30 DIAGNOSIS — I63231 Cerebral infarction due to unspecified occlusion or stenosis of right carotid arteries: Secondary | ICD-10-CM | POA: Diagnosis not present

## 2017-05-07 ENCOUNTER — Telehealth: Payer: Self-pay | Admitting: Neurology

## 2017-05-07 NOTE — Telephone Encounter (Signed)
Called and spoke to Pt, she has had another stroke, doesn't think she can make Mon 2/25 @ 7:30a-changed appt to 2/26 11:30

## 2017-05-07 NOTE — Telephone Encounter (Signed)
Pt left a VM message asking for a call back from Dr Moises BloodJaffe's nurse

## 2017-05-10 ENCOUNTER — Ambulatory Visit: Payer: Medicaid Other | Admitting: Neurology

## 2017-05-11 ENCOUNTER — Ambulatory Visit (INDEPENDENT_AMBULATORY_CARE_PROVIDER_SITE_OTHER): Payer: Medicaid Other | Admitting: Neurology

## 2017-05-11 ENCOUNTER — Encounter: Payer: Self-pay | Admitting: Neurology

## 2017-05-11 DIAGNOSIS — Z794 Long term (current) use of insulin: Secondary | ICD-10-CM

## 2017-05-11 DIAGNOSIS — G43009 Migraine without aura, not intractable, without status migrainosus: Secondary | ICD-10-CM

## 2017-05-11 DIAGNOSIS — I639 Cerebral infarction, unspecified: Secondary | ICD-10-CM

## 2017-05-11 DIAGNOSIS — I6300 Cerebral infarction due to thrombosis of unspecified precerebral artery: Secondary | ICD-10-CM

## 2017-05-11 DIAGNOSIS — I10 Essential (primary) hypertension: Secondary | ICD-10-CM | POA: Diagnosis not present

## 2017-05-11 DIAGNOSIS — G3184 Mild cognitive impairment, so stated: Secondary | ICD-10-CM

## 2017-05-11 DIAGNOSIS — E118 Type 2 diabetes mellitus with unspecified complications: Secondary | ICD-10-CM

## 2017-05-11 DIAGNOSIS — E785 Hyperlipidemia, unspecified: Secondary | ICD-10-CM | POA: Diagnosis not present

## 2017-05-11 NOTE — Patient Instructions (Addendum)
1.  Continue aspirin 81mg  daily and Plavix 75mg  daily 2.  Continue the Crestor and diabetes and blood pressure medications 3.  I want to send you to see Dr. Delia HeadyPramod Sethi, [facility] stroke [facility] to see if we need to perform any other tests looking for cause of stroke. They will contact you directly to schedule an appt. If you do not hear from them they can be reached at [phone]. 4.  Continue divalproex 500mg  twice daily for headache prevention 5.  Continue physical therapy at home.  In meantime, use cane if you feel unsteady. 6.  Follow up in 4 months.

## 2017-05-11 NOTE — Progress Notes (Signed)
NEUROLOGY FOLLOW UP OFFICE NOTE  Kelly Romero [id number]  HISTORY OF PRESENT ILLNESS: Kelly Romero is a 52 year old right-handed female with diabetes, hypertension and depression, headache and prior strokes who presents for recent stroke.     I  STROKE/ENCEPHALOPATHY: Update: She was admitted to [facility] in [place] from 04/27/17 to 04/30/17 for stroke.   She presented with one week history of intermittent slurred speech.  CT of head showed no acute abnormalities.  CTA of head showed multiple stable segments of mild to moderate stenosis involving the ACA, MCA and PCA distributions as well as stable severe stenosis of left M2.  Compared to prior CTA from July 2018, she demonstrated interval occlusion of right PCA distal calcarine branch.  CTA of neck showed bilateral 60% proximal ICA stenosis.  MRI of brain with and without contrast showed multiple subacute infarcts involving the the right lateral lenticulostriate territory, right parietal region and cortical cerebral hemispheres.  TTE showed EF 65-70% with negative bubble study.  LDL was 347.  Hgb A1c was 13.6.  She reportedly had been noncompliant with her medications. She had stopped taking her medication for a month due to depression.  She is back on all of her medications.  It was believed that her strokes were secondary to intracranial large vessel disease rather than cardiac source.  Her gait has been worse since discharge and she has fallen twice.  She is receiving home PT.  She is taking aspirin, Plavix, Crestor 40mg , Repatha, Glyxambi, Levemir, lisinopril.  History: She was in [facility] on 12/07/14 for stroke symptoms.  She developed slurred speech and right arm numbness.   She was agitated, yelling and screaming in the ED.  She was given Ativan and Haldol.  CT of head was unremarkable.  Repeat CT of head two days later was negative.  CXR negative.  Urine tox screen was positive for opioids.  She was found to have  severally elevated blood pressure with systolic at 240 and higher.  Hgb A1c was 12 with serum glucose level of 400.  She did not have fever or leukocytosis, and UA was borderline with trace leukocytes but negative nitrite.  She was treated with 5 days of IV Rocephin.  Troponins were negative.  Etiology was unknown but TIA was suspected as a possibility.   She had a similar stroke-like episode on 02/22/15.  She woke up with headache and later developed slurred speech.  She was admitted to [facility], where MRI of brain revealed 2 mm left occipital subacute infarct.  2D echo was performed, which appeared unremarkable, but didn't make mention of a PFO.  Carotid doppler again showed no hemodynamically significant ICA stenosis.  Hypercoagulable panel, ANA, lupus panel, antiextractable nuclear antigen were negative.  LDL was 130.  Plavix was added to her ASA.  Crestor was increased to 40mg  daily.  Labs from August 2016 include Sed Rate 30, ANA negative, ANCA screen negative, antiextractable nuclear antigen (RNP abs, Smith abs) negative.  She had a TEE on 04/19/15, which revealed EF 60-65% with no PFO or atrial septal defect or other cardiac source of emboli.  After 3 months on dual antiplatelet therapy, she was continued on Plavix alone.  She was on amitriptyline 25mg  at bedtime to help with depression and headache.   She was admitted to [facility] between 08/15/15 and 08/19/15 for right-sided (however some notes state left-sided) weakness and numbness associated with forgetfulness and agitation.  Blood pressure was elevated.  NIHSS  was 4.  Due to minimal symptoms, she did not receive tPA.  Urine tox screen was negative.  CT of head showed no acute findings.  MRI of brain with and without contrast revealed possible small acute infarct in the posterior right MCA territory.  However, it appears on previous imaging, which suggests it is likely an old finding.  Neurology believed it to be an old infarct, as it  apparently was seen on a prior MRI in February 2017.  TTE again was unremarkable with EF of 65-70%.  LDL was 231.  Hgb A1c was 10.5.  Exact cause of encephalopathy was unclear.  She underwent an EEG on 08/18/15, which revealed mild to moderate generalized background slowing but no epileptiform activity.  Neurology felt symptoms may be psychogenic.  She was started on Depakote 500mg  twice daily for possible seizures.    She was maintained on ASA 81mg  and Plavix, as well as Rosuvastatin 20mg .  She reports word-finding difficulties, which has been ongoing for several months.   She was readmitted to [facility] from 12/26/15 to 12/22/2015 for altered mental status, fever of 103 and elevated blood pressure with systolic in 220s.  MRI of brain revealed small acute and subacute infarcts in the bilateral cerebral hemisphere.  Due to fever, she was started on IV antibiotics and acyclovir.  UA and urine tox screen was negative.  Blood cultures showed 1 of 2 positive for gram +, likely contaminated.  Endocarditis was not suspected.  She underwent LP.  CSF cell count was 1, gram stain negative, protein 49, glucose 98, oligoclonal bands negative, fungal cultures, CMV, VZV, VDRL and HSV were negative.  In the serum, HIV and RPR were nonreactive.  Arbovirus panel was negative.  ACE was 26.  Hypercoagulable panel (including homocysteine, lupus anticoagulant, beta-2-glycoprotein, cardiolipin antibodies) was negative.  TTE showed normal LVEF 55-60% and TCD showed clinically insignificant right to left shunt with valsalva only.  Cerebral angiogram revealed mild athersclerosis with 65-70% stenosis of left MCA superior division, severe stenosis of proximal PCA P1 segment and tapered stenosis of distal right pericallosal artery.  Hgb A1c was 13.  LDL was 153.  Lower extremity doppler revealed distal DVT in the right lower extremity.  Plavix was switched to ASA 325mg  daily.  DVT is being monitored and managed by her PCP.  Recommendation was  to recheck venous ultrasound every 2 weeks and if there is any proximal extension, then the switch to anticoagulation.  30 day Holter monitor from late 2017 revealed no arrhythmia.   She was admitted to [facility] from on 10/02/16 for headache, dizziness and hypertension.  CT of head from 09/30/16 showed a subacute to chronic infarct in the left parietooccipital junction.  CTA of head from 10/01/16  demonstrated high-grade stenosis or occlusion at the left P2 segment bifurcation with nonvisualization of the superior left P3 segment. MRI of brain without contrast from 10/02/16 demonstrated a small acute infarct in the right body of the corpus callosum.  Carotid doppler showed moderate heterogeneous and calcified plaque without hemodynamically significant stenosis.  2D echo showed EF 60-65% with no PFO or other cardiac source of emboli.  Telemetry revealed no atrial fibrillation.  LDL was reportedly 239.  Rosuvastatin 40mg  was changed to atorvastatin 80mg .  She was maintained on ASA 325mg  and Plavix 75mg .  She was also treated for UTI.  She had a nuclear stress test on 11/26/16 which was normal with EF 55-65%.  For uncontrolled hypertension, she had a renal ultrasound on  11/25/16, which was normal.   II Cognitive impairment/aphasia: She underwent neuropsychological testing on 03/10/16.  She exhibited adjustment disorder with depression and anxiety.  However, a cognitive diagnosis could not be made due to poor effort on testing.  It was recommended to continue treating anxiety and depression.  III  HEADACHES: Update: She has not had any recent headaches. Current NSAIDS:  no Current analgesics:  Ibuprofen, Tylenol Current triptans:  no Current anti-emetic:  no Current muscle relaxants:  no Current anti-anxiolytic:  no Current sleep aide:  no Current Antihypertensive medications:  lisinopril Current Antidepressant medications:  escitalopram 10mg  Current Anticonvulsant medications:  divalproex DR  500mg  twice daily Current Vitamins/Herbal/Supplements:  Magnesium citrate 400mg  Current Antihistamines/Decongestants:  no Other therapy:  No  04/26/17:  CBC with WBC 4.7, HGB 11.9, HCT 34.8, PLT 204; CMP with Na 133, K 3.8, Cl 95, CO2 33, glucose 471, BUN 17, Cr 1.00, t bili 0.3, ALP 70, AST 15 and ALT 18.   History: Onset:  July 2016.  Sudden onset. Location:  Right sided, from front to back of head.  Initially had pain behind right ear. Quality:  squeezing Initial intensity:  7-8/10 Aura:  no Prodrome:  no Associated symptoms:  Some photophobia and phonophobia.  Sometimes nausea.  She denies visual disturbance. Initial Duration:  All day Initial Frequency:  Daily.  Sometimes wakes her up at night Triggers/exacerbating factors:  Loud noise Relieving factors:  Laying in a cool dark quiet room   Past abortive medication:  Tylenol, Excedrin, Percocet, ibuprofen 800mg  Past preventative medication:  amitriptyline 25mg  Other past therapy:  none   Sed Rate was 30.  MRI of the brain from 11/09/14 showed chronic small vessel ischemic changes with an incidental tiny subacute infarct in the right frontal lobe.    PAST MEDICAL HISTORY: Past Medical History:  Diagnosis Date  . Diabetes mellitus without complication (HCC)   . Headache   . Hyperlipidemia   . Hypertension   . TIA (transient ischemic attack)     MEDICATIONS: Current Outpatient Medications on File Prior to Visit  Medication Sig Dispense Refill  . amitriptyline (ELAVIL) 25 MG tablet Take 25 mg by mouth 2 (two) times daily.    . clopidogrel (PLAVIX) 75 MG tablet Take 1 tablet (75 mg total) by mouth daily. 30 tablet 11  . divalproex (DEPAKOTE) 500 MG DR tablet Take 1 tablet (500 mg total) by mouth 2 (two) times daily. 60 tablet 3  . Empagliflozin-Linagliptin (GLYXAMBI) 25-5 MG TABS Take 25 mg by mouth.    . escitalopram (LEXAPRO) 20 MG tablet Take 20 mg by mouth daily.    . Evolocumab (REPATHA SURECLICK) 140 MG/ML SOAJ Inject  140 mg into the skin every 14 (fourteen) days. 2 pen 11  . ibuprofen (ADVIL,MOTRIN) 800 MG tablet Take 1 tablet (800 mg total) by mouth every 8 (eight) hours as needed. 30 tablet 2  . insulin detemir (LEVEMIR) 100 UNIT/ML injection Inject 85 Units into the skin every morning.     Marland Kitchen lisinopril (PRINIVIL,ZESTRIL) 40 MG tablet Take 1 tablet (40 mg total) by mouth daily. 30 tablet 3  . rosuvastatin (CRESTOR) 40 MG tablet Take 1 tablet (40 mg total) by mouth at bedtime. 30 tablet 3   No current facility-administered medications on file prior to visit.     ALLERGIES: Allergies  Allergen Reactions  . Erythromycin Itching  . Latex     itch    FAMILY HISTORY: Family History  Problem Relation Age of Onset  .  Stroke Father        6s  . Heart attack Father 76  . COPD Mother   . Heart failure Brother   . Cancer Maternal Grandmother        unknown   . COPD Unknown   . Heart failure Maternal Grandfather   . Hypertension Maternal Grandfather   . Cancer Paternal Grandfather        lung  . Diabetes Paternal Grandmother     SOCIAL HISTORY: Social History   Socioeconomic History  . Marital status: Married    Spouse name: Not on file  . Number of children: Not on file  . Years of education: Not on file  . Highest education level: Not on file  Social Needs  . Financial resource strain: Not on file  . Food insecurity - worry: Not on file  . Food insecurity - inability: Not on file  . Transportation needs - medical: Not on file  . Transportation needs - non-medical: Not on file  Occupational History  . Occupation: Data processing manager  Tobacco Use  . Smoking status: Never Smoker  . Smokeless tobacco: Never Used  Substance and Sexual Activity  . Alcohol use: No    Alcohol/week: 0.0 oz    Comment: rare   . Drug use: No  . Sexual activity: No  Other Topics Concern  . Not on file  Social History Narrative  . Not on file    REVIEW OF SYSTEMS: Constitutional: No fevers, chills, or  sweats, no generalized fatigue, change in appetite Eyes: No visual changes, double vision, eye pain Ear, nose and throat: No hearing loss, ear pain, nasal congestion, sore throat Cardiovascular: No chest pain, palpitations Respiratory:  No shortness of breath at rest or with exertion, wheezes GastrointestinaI: No nausea, vomiting, diarrhea, abdominal pain, fecal incontinence Genitourinary:  No dysuria, urinary retention or frequency Musculoskeletal:  No neck pain, back pain Integumentary: No rash, pruritus, skin lesions Neurological: as above Psychiatric: No depression, insomnia, anxiety Endocrine: No palpitations, fatigue, diaphoresis, mood swings, change in appetite, change in weight, increased thirst Hematologic/Lymphatic:  No purpura, petechiae. Allergic/Immunologic: no itchy/runny eyes, nasal congestion, recent allergic reactions, rashes  PHYSICAL EXAM: Vitals:   05/11/17 1128  BP: 112/62  Pulse: 90  SpO2: 98%   General: No acute distress.   Head:  Normocephalic/atraumatic Eyes:  Fundi examined but not visualized Neck: supple, no paraspinal tenderness, full range of motion Heart:  Regular rate and rhythm Lungs:  Clear to auscultation bilaterally Back: No paraspinal tenderness Neurological Exam: alert and oriented to person, place, and time. Attention span and concentration mildly impaired, recent memory mildly impaired, remote memory intact, fund of knowledge intact.  Speech fluent and not dysarthric, language intact.  Right homonymous hemianopsia, flattening of right nasolabial fold.  Otherwise, CN II-XII intact. Bulk and tone normal, muscle strength 5-/5 right upper and lower extremities, 5/5 left upper and lower extremities  Sensation to light touch, temperature and vibration intact.  Deep tendon reflexes 2+ throughout, toes downgoing.  Finger to nose testing intact.  Mildly wide-based and cautious gait.  Romberg negative.  IMPRESSION: 1.  Recurrent strokes, cryptogenic.   Residual cognitive impairment and right homonymous hemianopsia.  She has uncontrolled diabetes, hyperlipidemia, hypertension and significant intracranial atherosclerotic disease which all could have contributed to her strokes.  However, due to the multiple infarcts, I am wondering if further cardiac workup with loop recorder is needed.  Other stroke workup has been unremarkable (including TTE/TEE, 30 day Holter monitor, carotid doppler  and hypercoagulable panel).  She had a DVT but no PFO.   2.  Vascular cognitive impairment. 3.  Migraine and tension type headache 4.  Hypertension 5.  Hyperlipidemia 6.  Morbid obesity (BMI 41.60) 7.  Depression and anxiety  PLAN: 1.  I would like to consult Dr. Delia Heady, stroke specialist, to see if further workup, such as loop recorder, is needed (or if this is all secondary to uncontrolled intracranial small and large vessel disease). 2.  Continue ASA and Plavix for secondary stroke prevention 3.  Continue Crestor (LDL goal less than 70) 4.  Optimize glycemic and blood pressure control 5.  Continue physical therapy 6.  Continue divalproex DR 500mg  twice daily. 7.  Follow up in 4 months.  25 minutes spent face to face with patient, over 50% spent discussing management.  Shon Millet, DO  CC:  Dr. Jeanie Sewer

## 2017-05-11 NOTE — Progress Notes (Signed)
Adam  Thanks I  am doing well and look forward to seeing the patient  soon

## 2017-07-08 ENCOUNTER — Emergency Department: Payer: Medicaid Other

## 2017-07-08 ENCOUNTER — Other Ambulatory Visit: Payer: Self-pay

## 2017-07-08 ENCOUNTER — Inpatient Hospital Stay
Admission: EM | Admit: 2017-07-08 | Discharge: 2017-07-10 | DRG: 066 | Disposition: A | Discharge status: Home Health | Payer: Medicaid Other | Attending: Internal Medicine | Admitting: Internal Medicine

## 2017-07-08 DIAGNOSIS — I6789 Other cerebrovascular disease: Secondary | ICD-10-CM | POA: Diagnosis not present

## 2017-07-08 DIAGNOSIS — Z823 Family history of stroke: Secondary | ICD-10-CM | POA: Diagnosis not present

## 2017-07-08 DIAGNOSIS — R29810 Facial weakness: Secondary | ICD-10-CM

## 2017-07-08 DIAGNOSIS — I6619 Occlusion and stenosis of unspecified anterior cerebral artery: Secondary | ICD-10-CM | POA: Diagnosis present

## 2017-07-08 DIAGNOSIS — I639 Cerebral infarction, unspecified: Principal | ICD-10-CM

## 2017-07-08 DIAGNOSIS — R4701 Aphasia: Secondary | ICD-10-CM | POA: Diagnosis present

## 2017-07-08 DIAGNOSIS — Z7982 Long term (current) use of aspirin: Secondary | ICD-10-CM

## 2017-07-08 DIAGNOSIS — Z79899 Other long term (current) drug therapy: Secondary | ICD-10-CM

## 2017-07-08 DIAGNOSIS — I6523 Occlusion and stenosis of bilateral carotid arteries: Secondary | ICD-10-CM | POA: Diagnosis present

## 2017-07-08 DIAGNOSIS — F329 Major depressive disorder, single episode, unspecified: Secondary | ICD-10-CM | POA: Diagnosis present

## 2017-07-08 DIAGNOSIS — R29704 NIHSS score 4: Secondary | ICD-10-CM | POA: Diagnosis present

## 2017-07-08 DIAGNOSIS — E785 Hyperlipidemia, unspecified: Secondary | ICD-10-CM | POA: Diagnosis present

## 2017-07-08 DIAGNOSIS — Z8673 Personal history of transient ischemic attack (TIA), and cerebral infarction without residual deficits: Secondary | ICD-10-CM | POA: Diagnosis not present

## 2017-07-08 DIAGNOSIS — Z7902 Long term (current) use of antithrombotics/antiplatelets: Secondary | ICD-10-CM

## 2017-07-08 DIAGNOSIS — F410 Panic disorder [episodic paroxysmal anxiety] without agoraphobia: Secondary | ICD-10-CM | POA: Diagnosis present

## 2017-07-08 DIAGNOSIS — R471 Dysarthria and anarthria: Secondary | ICD-10-CM | POA: Diagnosis present

## 2017-07-08 DIAGNOSIS — E119 Type 2 diabetes mellitus without complications: Secondary | ICD-10-CM | POA: Diagnosis present

## 2017-07-08 DIAGNOSIS — Z881 Allergy status to other antibiotic agents status: Secondary | ICD-10-CM | POA: Diagnosis not present

## 2017-07-08 DIAGNOSIS — Z91040 Latex allergy status: Secondary | ICD-10-CM | POA: Diagnosis not present

## 2017-07-08 DIAGNOSIS — I10 Essential (primary) hypertension: Secondary | ICD-10-CM | POA: Diagnosis present

## 2017-07-08 HISTORY — DX: Cerebral infarction, unspecified: I63.9

## 2017-07-08 LAB — URINALYSIS, COMPLETE (UACMP) WITH MICROSCOPIC
BILIRUBIN URINE: NEGATIVE
Glucose, UA: NEGATIVE mg/dL
HGB URINE DIPSTICK: NEGATIVE
KETONES UR: NEGATIVE mg/dL
NITRITE: NEGATIVE
PROTEIN: NEGATIVE mg/dL
Specific Gravity, Urine: 1.009 (ref 1.005–1.030)
pH: 6 (ref 5.0–8.0)

## 2017-07-08 LAB — COMPREHENSIVE METABOLIC PANEL
ALBUMIN: 3.4 g/dL — AB (ref 3.5–5.0)
ALK PHOS: 77 U/L (ref 38–126)
ALT: 12 U/L — ABNORMAL LOW (ref 14–54)
ANION GAP: 5 (ref 5–15)
AST: 20 U/L (ref 15–41)
BUN: 24 mg/dL — ABNORMAL HIGH (ref 6–20)
CALCIUM: 9.1 mg/dL (ref 8.9–10.3)
CHLORIDE: 106 mmol/L (ref 101–111)
CO2: 28 mmol/L (ref 22–32)
Creatinine, Ser: 1.23 mg/dL — ABNORMAL HIGH (ref 0.44–1.00)
GFR calc Af Amer: 57 mL/min — ABNORMAL LOW (ref >60)
GFR calc non Af Amer: 49 mL/min — ABNORMAL LOW (ref >60)
Glucose, Bld: 175 mg/dL — ABNORMAL HIGH (ref 65–99)
Potassium: 4.2 mmol/L (ref 3.5–5.1)
SODIUM: 139 mmol/L (ref 135–145)
Total Bilirubin: 0.5 mg/dL (ref 0.3–1.2)
Total Protein: 6.9 g/dL (ref 6.5–8.1)

## 2017-07-08 LAB — DIFFERENTIAL
Basophils Absolute: 0 10*3/uL (ref 0–0.1)
Basophils Relative: 0 %
Eosinophils Absolute: 0.1 10*3/uL (ref 0–0.7)
Eosinophils Relative: 1 %
LYMPHS PCT: 25 %
Lymphs Abs: 1.3 10*3/uL (ref 1.0–3.6)
Monocytes Absolute: 0.3 10*3/uL (ref 0.2–0.9)
Monocytes Relative: 6 %
Neutro Abs: 3.6 10*3/uL (ref 1.4–6.5)
Neutrophils Relative %: 68 %

## 2017-07-08 LAB — TROPONIN I: Troponin I: <0.03 ng/mL (ref <0.03)

## 2017-07-08 LAB — CBC
HCT: 31 % — ABNORMAL LOW (ref 35.0–47.0)
Hemoglobin: 10.2 g/dL — ABNORMAL LOW (ref 12.0–16.0)
MCH: 28.9 pg (ref 26.0–34.0)
MCHC: 33 g/dL (ref 32.0–36.0)
MCV: 87.4 fL (ref 80.0–100.0)
PLATELETS: 276 10*3/uL (ref 150–440)
RBC: 3.54 MIL/uL — AB (ref 3.80–5.20)
RDW: 14.6 % — ABNORMAL HIGH (ref 11.5–14.5)
WBC: 5.3 10*3/uL (ref 3.6–11.0)

## 2017-07-08 LAB — PROTIME-INR
INR: 0.9
PROTHROMBIN TIME: 12.1 s (ref 11.4–15.2)

## 2017-07-08 LAB — GLUCOSE, CAPILLARY: Glucose-Capillary: 118 mg/dL — ABNORMAL HIGH (ref 65–99)

## 2017-07-08 LAB — VALPROIC ACID LEVEL

## 2017-07-08 LAB — APTT: APTT: 39 s — AB (ref 24–36)

## 2017-07-08 MED ORDER — INSULIN DETEMIR 100 UNIT/ML ~~LOC~~ SOLN
40.0000 [IU] | Freq: Every day | SUBCUTANEOUS | Status: DC
  Administered 2017-07-08 – 2017-07-09 (×2): 40 [IU] via SUBCUTANEOUS
  Filled 2017-07-08 (×4): qty 0.4

## 2017-07-08 MED ORDER — ACETAMINOPHEN 650 MG RE SUPP: 650.0000 mg | RECTAL | Status: DC | PRN

## 2017-07-08 MED ORDER — DIAZEPAM 5 MG/ML IJ SOLN: 5.0000 mg | Freq: Once | INTRAMUSCULAR | Status: DC

## 2017-07-08 MED ORDER — STROKE: EARLY STAGES OF RECOVERY BOOK
Freq: Once | Status: AC
  Administered 2017-07-08: 18:00:00

## 2017-07-08 MED ORDER — ASPIRIN EC 81 MG PO TBEC
81.0000 mg | DELAYED_RELEASE_TABLET | ORAL | Status: DC
  Administered 2017-07-08 – 2017-07-09 (×2): 81 mg via ORAL
  Filled 2017-07-08 (×3): qty 1

## 2017-07-08 MED ORDER — DIVALPROEX SODIUM 500 MG PO DR TAB
500.0000 mg | DELAYED_RELEASE_TABLET | Freq: Two times a day (BID) | ORAL | Status: DC
  Administered 2017-07-08 – 2017-07-10 (×4): 500 mg via ORAL
  Filled 2017-07-08 (×7): qty 1

## 2017-07-08 MED ORDER — SODIUM CHLORIDE 0.9 % IV SOLN
INTRAVENOUS | Status: DC
  Administered 2017-07-08 – 2017-07-09 (×3): via INTRAVENOUS

## 2017-07-08 MED ORDER — ACETAMINOPHEN 325 MG PO TABS: 650.0000 mg | ORAL_TABLET | ORAL | Status: DC | PRN

## 2017-07-08 MED ORDER — ROSUVASTATIN CALCIUM 20 MG PO TABS
40.0000 mg | ORAL_TABLET | Freq: Every day | ORAL | Status: DC
  Administered 2017-07-08 – 2017-07-09 (×2): 40 mg via ORAL
  Filled 2017-07-08 (×2): qty 2

## 2017-07-08 MED ORDER — B2 100 MG PO TABS: 200.0000 mg | ORAL_TABLET | ORAL | Status: DC

## 2017-07-08 MED ORDER — ACETAMINOPHEN 160 MG/5ML PO SOLN
650.0000 mg | ORAL | Status: DC | PRN
  Filled 2017-07-08: qty 20.3

## 2017-07-08 MED ORDER — CLOPIDOGREL BISULFATE 75 MG PO TABS
75.0000 mg | ORAL_TABLET | Freq: Every day | ORAL | Status: DC
  Administered 2017-07-08 – 2017-07-10 (×3): 75 mg via ORAL
  Filled 2017-07-08 (×3): qty 1

## 2017-07-08 MED ORDER — HYDRALAZINE HCL 20 MG/ML IJ SOLN
10.0000 mg | Freq: Three times a day (TID) | INTRAMUSCULAR | Status: DC | PRN
  Administered 2017-07-08 – 2017-07-10 (×2): 10 mg via INTRAVENOUS
  Filled 2017-07-08 (×2): qty 1

## 2017-07-08 MED ORDER — ENOXAPARIN SODIUM 40 MG/0.4ML ~~LOC~~ SOLN
40.0000 mg | SUBCUTANEOUS | Status: DC
  Administered 2017-07-08 – 2017-07-09 (×2): 40 mg via SUBCUTANEOUS
  Filled 2017-07-08 (×2): qty 0.4

## 2017-07-08 NOTE — ED Provider Notes (Signed)
[facility] Emergency Department Provider Note       Time seen: ----------------------------------------- 1:15 PM on 07/08/2017 -----------------------------------------   I have reviewed the triage vital signs and the nursing notes.  HISTORY   Chief Complaint Aphasia    HPI Kelly Romero is a 52 y.o. female with a history of diabetes, headache, hyperlipidemia, hypertension and TIA who presents to the ED for facial droop and slurred speech.  EMS reports facial droop and slurred speech which is unusual for her.  Reportedly this happen yesterday then resolved spontaneously.  She has had a history of TIAs and CVA in the past with similar symptoms.  She currently takes Plavix.  Family feels like she got better after she calmed down.  Past Medical History:  Diagnosis Date  . Diabetes mellitus without complication (HCC)   . Headache   . Hyperlipidemia   . Hypertension   . TIA (transient ischemic attack)     Patient Active Problem List   Diagnosis Date Noted  . Chest pain 11/06/2016  . 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 Aphasia as late effect of stroke 04/10/2016  . Encephalopathy   . Cerebral thrombosis with cerebral infarction 12/29/2015  . Hyperglycemia   . Hyperlipidemia   . Hypertensive emergency   . Deep venous thrombosis (HCC)   . AKI (acute kidney injury) (HCC) 12/26/2015  . Altered mental status   . Stroke-like symptoms   . Acute encephalopathy 08/17/2015  . Anxiety and depression   . CVA (cerebrovascular accident due to intracerebral hemorrhage) (HCC) 08/15/2015  . Stroke (HCC)   . Encephalopathy, metabolic 12/28/2014  . 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 Essential hypertension 12/28/2014  . New onset headache 11/02/2014    Past Surgical History:  Procedure Laterality Date  . APPENDECTOMY    . CHOLECYSTECTOMY    . FOOT GANGLION EXCISION     . HERNIA REPAIR    . IR GENERIC HISTORICAL  12/30/2015   IR ANGIO INTRA EXTRACRAN SEL COM CAROTID INNOMINATE BILAT MOD SED 12/30/2015 Julieanne Cotton, MD [facility]INTER[facility] RAD  . IR GENERIC HISTORICAL  12/30/2015   IR ANGIO VERTEBRAL SEL VERTEBRAL BILAT MOD SED 12/30/2015 Julieanne Cotton, MD [facility]  . TEE WITHOUT CARDIOVERSION N/A 04/19/2015   Procedure: TRANSESOPHAGEAL ECHOCARDIOGRAM (TEE);  Surgeon: Jake Bathe, MD;  Location: [facility] ENDOSCOPY;  Service: Cardiovascular;  Laterality: N/A;  . VAGINAL HYSTERECTOMY      Allergies Erythromycin and Latex  Social History Social History   Tobacco Use  . Smoking status: Never Smoker  . Smokeless tobacco: Never Used  Substance Use Topics  . Alcohol use: No    Alcohol/week: 0.0 oz    Comment: rare   . Drug use: No   Review of Systems Constitutional: Negative for fever. Eyes: Negative for vision changes Cardiovascular: Negative for chest pain. Respiratory: Negative for shortness of breath. Gastrointestinal: Negative for abdominal pain, vomiting and diarrhea. Musculoskeletal: Negative for back pain. Skin: Negative for rash. Neurological: Positive for difficulty speaking, facial droop  All systems negative/normal/unremarkable except as stated in the HPI  ____________________________________________   PHYSICAL EXAM:  VITAL SIGNS: ED Triage Vitals  Enc Vitals Group     BP 07/08/17 1218 132/78     Pulse Rate 07/08/17 1218 88     Resp 07/08/17 1218 15     Temp 07/08/17 1218 98 F (36.7 C)     Temp Source 07/08/17 1218 Oral  SpO2 07/08/17 1218 96 %     Weight 07/08/17 1214 220 lb (99.8 kg)     Height 07/08/17 1214 5\' 5"  (1.651 m)     Head Circumference --      Peak Flow --      Pain Score 07/08/17 1214 0     Pain Loc --      Pain Edu? --      Excl. in GC? --    Constitutional: Alert and oriented.  No distress Eyes: Conjunctivae are normal. Normal extraocular movements. ENT   Head: Normocephalic and  atraumatic.   Nose: No congestion/rhinnorhea.   Mouth/Throat: Mucous membranes are moist.   Neck: No stridor. Cardiovascular: Normal rate, regular rhythm. No murmurs, rubs, or gallops. Respiratory: Normal respiratory effort without tachypnea nor retractions. Breath sounds are clear and equal bilaterally. No wheezes/rales/rhonchi. Gastrointestinal: Soft and nontender. Normal bowel sounds Musculoskeletal: Nontender with normal range of motion in extremities. No lower extremity tenderness nor edema. Neurologic: Slurred speech with facial drooping is noted.  Paresthesias center to the right side of her face, upper and lower extremity strength and sensation appears to be normal Skin:  Skin is warm, dry and intact. No rash noted. Psychiatric: Mood and affect are normal. Speech and behavior are normal.  ____________________________________________  EKG: Interpreted by me.  Sinus rhythm rate 81 bpm, normal PR interval, T wave abnormalities are noted diffusely, normal QT  ____________________________________________  ED COURSE:  As part of my medical decision making, I reviewed the following data within the electronic MEDICAL RECORD NUMBER History obtained from family if available, nursing notes, old chart and ekg, as well as notes from prior ED visits. Patient presented for slurred speech and facial droop, we will assess with labs and imaging as indicated at this time.   Procedures ____________________________________________   LABS (pertinent positives/negatives)  Labs Reviewed  APTT - Abnormal; Notable for the following components:      Result Value   aPTT 39 (*)    All other components within normal limits  CBC - Abnormal; Notable for the following components:   RBC 3.54 (*)    Hemoglobin 10.2 (*)    HCT 31.0 (*)    RDW 14.6 (*)    All other components within normal limits  COMPREHENSIVE METABOLIC PANEL - Abnormal; Notable for the following components:   Glucose, Bld 175 (*)     BUN 24 (*)    Creatinine, Ser 1.23 (*)    Albumin 3.4 (*)    ALT 12 (*)    GFR calc non Af Amer 49 (*)    GFR calc Af Amer 57 (*)    All other components within normal limits  URINALYSIS, COMPLETE (UACMP) WITH MICROSCOPIC - Abnormal; Notable for the following components:   Color, Urine YELLOW (*)    APPearance HAZY (*)    Leukocytes, UA TRACE (*)    Bacteria, UA RARE (*)    All other components within normal limits  PROTIME-INR  DIFFERENTIAL  TROPONIN I  CBG MONITORING, ED    RADIOLOGY Images were viewed by me  CT head just reveals chronic ischemic findings  ____________________________________________  DIFFERENTIAL DIAGNOSIS   TIA, CVA, anxiety  FINAL ASSESSMENT AND PLAN  Dysarthria, facial droop   Plan: The patient had presented for slurred speech and facial drooping. Patient's labs are unremarkable. Patient's imaging is also unremarkable.  Patient will benefit from MRI and further inpatient work-up.   Ulice DashJohnathan E Williams, MD   Note: This note was generated  in part or whole with voice recognition software. Voice recognition is usually quite accurate but there are transcription errors that can and very often do occur. I apologize for any typographical errors that were not detected and corrected.     Emily Filbert, MD 07/08/17 [phone]

## 2017-07-08 NOTE — ED Triage Notes (Signed)
Pt arrived via ems for c/o "TIA" - pt states she has hx of "mini strokes" - at 7pm yesterday pt was fine - at 11pm family came home and reports facial droop and slurred speech - ems reported facial droop that this nurse does not visualize - pt does have slurred speech and is having difficulty finding her words

## 2017-07-08 NOTE — H&P (Signed)
[facility] - [place] at [facility]   PATIENT NAME: Kelly Romero    MR#:  [id number]  DATE OF BIRTH:  01/08/1966  DATE OF ADMISSION:  07/08/2017  PRIMARY CARE PHYSICIAN: Noni Saupeedding, [name], MD   REQUESTING/REFERRING PHYSICIAN: Emily FilbertWilliams, Jonathan E, MD CHIEF COMPLAINT:   Chief Complaint  Patient presents with  . Aphasia    HISTORY OF PRESENT ILLNESS: Kelly Romero  is a 52 y.o. female with a known history of previous TIA and CVA presents with aphasia since yesterday evening.  Patient also complains of weakness in the right upper extremity.  Patient states that she takes aspirin and Plavix daily.  She denies any numbness or tingling.  Denies any visual deficits.  CT scan of the head in the ER was negative  PAST MEDICAL HISTORY:   Past Medical History:  Diagnosis Date  . Diabetes mellitus without complication (HCC)   . Headache   . Hyperlipidemia   . Hypertension   . TIA (transient ischemic attack)     PAST SURGICAL HISTORY:  Past Surgical History:  Procedure Laterality Date  . APPENDECTOMY    . CHOLECYSTECTOMY    . FOOT GANGLION EXCISION    . HERNIA REPAIR    . IR GENERIC HISTORICAL  12/30/2015   IR ANGIO INTRA EXTRACRAN SEL COM CAROTID INNOMINATE BILAT MOD SED 12/30/2015 Julieanne CottonSanjeev Deveshwar, MD [facility]-INTER[facility]  . IR GENERIC HISTORICAL  12/30/2015   IR ANGIO VERTEBRAL SEL VERTEBRAL BILAT MOD SED 12/30/2015 Julieanne CottonSanjeev Deveshwar, MD [facility]  . TEE WITHOUT CARDIOVERSION N/A 04/19/2015   Procedure: TRANSESOPHAGEAL ECHOCARDIOGRAM (TEE);  Surgeon: Jake BatheMark C Skains, MD;  [facility];  Service: Cardiovascular;  Laterality: N/A;  . VAGINAL HYSTERECTOMY      SOCIAL HISTORY:  Social History   Tobacco Use  . Smoking status: Never Smoker  . Smokeless tobacco: Never Used  Substance Use Topics  . Alcohol use: No    Alcohol/week: 0.0 oz    Comment: rare     FAMILY HISTORY:  Family History  Problem Relation Age of Onset  . Stroke Father        1250s   . Heart attack Father [id number]  . COPD Mother   . Heart failure Brother   . Cancer Maternal Grandmother        unknown   . COPD Unknown   . Heart failure Maternal Grandfather   . Hypertension Maternal Grandfather   . Cancer Paternal Grandfather        lung  . Diabetes Paternal Grandmother     DRUG ALLERGIES:  Allergies  Allergen Reactions  . Erythromycin Itching  . Latex     itch    REVIEW OF SYSTEMS:   CONSTITUTIONAL: No fever, fatigue or weakness.  EYES: No blurred or double vision.  EARS, NOSE, AND THROAT: No tinnitus or ear pain.  RESPIRATORY: No cough, shortness of breath, wheezing or hemoptysis.  CARDIOVASCULAR: No chest pain, orthopnea, edema.  GASTROINTESTINAL: No nausea, vomiting, diarrhea or abdominal pain.  GENITOURINARY: No dysuria, hematuria.  ENDOCRINE: No polyuria, nocturia,  HEMATOLOGY: No anemia, easy bruising or bleeding SKIN: No rash or lesion. MUSCULOSKELETAL: No joint pain or arthritis.   NEUROLOGIC: Positive for aphasia right hand weakness.  PSYCHIATRY: No anxiety or depression.   MEDICATIONS AT HOME:  Prior to Admission medications   Medication Sig Start Date End Date Taking? Authorizing Provider  aspirin EC 81 MG tablet Take 1 tablet by mouth every morning. 02/24/15  Yes [provider]  clopidogrel (PLAVIX) 75 MG tablet Take 1 tablet (75 mg total) by mouth daily. 01/15/16  Yes Jaffe, Adam R, DO  glimepiride (AMARYL) 2 MG tablet Take 2 mg by mouth daily.   Yes [provider]  insulin detemir (LEVEMIR) 100 UNIT/ML injection Inject 85 Units into the skin every morning.    Yes [provider]  lisinopril (PRINIVIL,ZESTRIL) 40 MG tablet Take 1 tablet (40 mg total) by mouth daily. 01/06/17  Yes Runell Gess, MD  Magnesium 500 MG TABS Take 1 tablet by mouth every evening.   Yes [provider]  Riboflavin (B2) 100 MG TABS Take 200 mg by mouth every morning.   Yes [provider]  rosuvastatin (CRESTOR) 40  MG tablet Take 1 tablet (40 mg total) by mouth at bedtime. 01/01/16  Yes Reymundo Poll, MD  divalproex (DEPAKOTE) 500 MG DR tablet Take 1 tablet (500 mg total) by mouth 2 (two) times daily. Patient not taking: Reported on 07/08/2017 01/01/16   Reymundo Poll, MD  Evolocumab (REPATHA SURECLICK) 140 MG/ML SOAJ Inject 140 mg into the skin every 14 (fourteen) days. Patient not taking: Reported on 07/08/2017 12/09/16   Runell Gess, MD      PHYSICAL EXAMINATION:   VITAL SIGNS: Blood pressure 132/78, pulse 88, temperature 98 F (36.7 C), temperature source Oral, resp. rate 15, height 5\' 5"  (1.651 m), weight 99.8 kg (220 lb), SpO2 96 %.  GENERAL:  52 y.o.-year-old patient lying in the bed with no acute distress.  EYES: Pupils equal, round, reactive to light and accommodation. No scleral icterus. Extraocular muscles intact.  HEENT: Head atraumatic, normocephalic. Oropharynx and nasopharynx clear.  NECK:  Supple, no jugular venous distention. No thyroid enlargement, no tenderness.  LUNGS: Normal breath sounds bilaterally, no wheezing, rales,rhonchi or crepitation. No use of accessory muscles of respiration.  CARDIOVASCULAR: S1, S2 normal. No murmurs, rubs, or gallops.  ABDOMEN: Soft, nontender, nondistended. Bowel sounds present. No organomegaly or mass.  EXTREMITIES: No pedal edema, cyanosis, or clubbing.  NEUROLOGIC: Cranial nerves II through XII are intact.  Left-sided facial droop right hand weakness with decreasing grip otherwise strength intact pSYCHIATRIC: The patient is alert and oriented x 3.  SKIN: No obvious rash, lesion, or ulcer.   LABORATORY PANEL:   CBC Recent Labs  Lab 07/08/17 1224  WBC 5.3  HGB 10.2*  HCT 31.0*  PLT 276  MCV 87.4  MCH 28.9  MCHC 33.0  RDW 14.6*  LYMPHSABS 1.3  MONOABS 0.3  EOSABS 0.1  BASOSABS 0.0   ------------------------------------------------------------------------------------------------------------------  Chemistries  Recent  Labs  Lab 07/08/17 1224  NA 139  K 4.2  CL 106  CO2 28  GLUCOSE 175*  BUN 24*  CREATININE 1.23*  CALCIUM 9.1  AST 20  ALT 12*  ALKPHOS 77  BILITOT 0.5   ------------------------------------------------------------------------------------------------------------------ estimated creatinine clearance is 61.9 mL/min (A) (by C-G formula based on SCr of 1.23 mg/dL (H)). ------------------------------------------------------------------------------------------------------------------ No results for input(s): TSH, T4TOTAL, T3FREE, THYROIDAB in the last 72 hours.  Invalid input(s): FREET3   Coagulation profile Recent Labs  Lab 07/08/17 1224  INR 0.90   ------------------------------------------------------------------------------------------------------------------- No results for input(s): DDIMER in the last 72 hours. -------------------------------------------------------------------------------------------------------------------  Cardiac Enzymes Recent Labs  Lab 07/08/17 1224  TROPONINI <0.03   ------------------------------------------------------------------------------------------------------------------ Invalid input(s): POCBNP  ---------------------------------------------------------------------------------------------------------------  Urinalysis    Component Value Date/Time   COLORURINE YELLOW (A) 07/08/2017 1211   APPEARANCEUR HAZY (A) 07/08/2017 1211   APPEARANCEUR Clear 10/29/2013 1801   LABSPEC 1.009 07/08/2017 1211  LABSPEC 1.020 10/29/2013 1801   PHURINE 6.0 07/08/2017 1211   GLUCOSEU NEGATIVE 07/08/2017 1211   GLUCOSEU >=500 10/29/2013 1801   HGBUR NEGATIVE 07/08/2017 1211   BILIRUBINUR NEGATIVE 07/08/2017 1211   BILIRUBINUR Negative 10/29/2013 1801   KETONESUR NEGATIVE 07/08/2017 1211   PROTEINUR NEGATIVE 07/08/2017 1211   NITRITE NEGATIVE 07/08/2017 1211   LEUKOCYTESUR TRACE (A) 07/08/2017 1211   LEUKOCYTESUR Negative 10/29/2013 1801      RADIOLOGY: Ct Head Wo Contrast  Result Date: 07/08/2017 CLINICAL DATA:  Weakness and slurred speech for 1 day EXAM: CT HEAD WITHOUT CONTRAST TECHNIQUE: Contiguous axial images were obtained from the base of the skull through the vertex without intravenous contrast. COMPARISON:  04/27/2017 FINDINGS: Brain: Old left PCA infarct is identified. Scattered lacunar infarcts are again identified and stable. No findings to suggest acute hemorrhage, acute infarction or space-occupying lesion are noted. Vascular: No hyperdense vessel or unexpected calcification. Skull: Normal. Negative for fracture or focal lesion. Sinuses/Orbits: No acute finding. Other: None. IMPRESSION: Chronic ischemic changes are noted.  No acute abnormality noted. Electronically Signed   By: Alcide Clever M.D.   On: 07/08/2017 12:37    EKG: Orders placed or performed during the hospital encounter of 07/08/17  . ED EKG  . ED EKG  . EKG 12-Lead  . EKG 12-Lead    IMPRESSION AND PLAN: Patient is a 52 year old presenting with aphasia and weakness  1.  Acute CVA Placed on telemetry Neurology evaluation Continue aspirin Plavix MRI MRA of the brain Speech eval PT evaluation  2.  Diabetes type 2 we will give her half of her Levemir Placed on sliding scale insulin Check hemoglobin A1c Hold glimepiride  3.  Essential hypertension continue lisinopril  4.  Hyperlipidemia continue Crestor  5.  Miscellaneous Lovenox for DVT prophylaxis   All the records are reviewed and case discussed with ED provider. Management plans discussed with the patient, family and they are in agreement.  CODE STATUS: Code Status History    Date Active Date Inactive Code Status Order ID Comments User Context   12/26/2015 0834 01/01/2016 1844 Full Code [id number]  Denton Brick, MD ED   08/15/2015 2254 08/19/2015 2030 Full Code [id number]  [name], MD Inpatient       TOTAL TIME TAKING CARE OF THIS PATIENT: 55 minutes.    Auburn Bilberry M.D on 07/08/2017 at 2:32 PM  Between 7am to 6pm - Pager - [phone]  After 6pm go to [email or web address] - password [employer]  [facility] Office  [phone]  CC: Primary care physician; Noni Saupe, MD

## 2017-07-08 NOTE — Progress Notes (Signed)
PHARMACIST - PHYSICIAN ORDER COMMUNICATION  CONCERNING: P&T Medication Policy on Herbal Medications  DESCRIPTION:  This patient's order for:Riboflavin  has been noted.  This product(s) is classified as an "herbal" or natural product. Due to a lack of definitive safety studies or FDA approval, nonstandard manufacturing practices, plus the potential risk of unknown drug-drug interactions while on inpatient medications, the Pharmacy and Therapeutics Committee does not permit the use of "herbal" or natural products of this type within [facility].   ACTION TAKEN: The pharmacy department is unable to verify this order at this time and your patient has been informed of this safety policy. Please reevaluate patient's clinical condition at discharge and address if the herbal or natural product(s) should be resumed at that time.  

## 2017-07-09 ENCOUNTER — Inpatient Hospital Stay: Payer: Medicaid Other

## 2017-07-09 ENCOUNTER — Encounter: Payer: Self-pay | Admitting: *Deleted

## 2017-07-09 ENCOUNTER — Inpatient Hospital Stay (HOSPITAL_COMMUNITY)
Admit: 2017-07-09 | Discharge: 2017-07-09 | Disposition: A | Discharge status: Home / Self Care | Payer: Medicaid Other | Attending: Internal Medicine | Admitting: Internal Medicine

## 2017-07-09 DIAGNOSIS — I6789 Other cerebrovascular disease: Secondary | ICD-10-CM

## 2017-07-09 DIAGNOSIS — I639 Cerebral infarction, unspecified: Principal | ICD-10-CM

## 2017-07-09 LAB — LIPID PANEL
CHOL/HDL RATIO: 3.8 ratio
CHOLESTEROL: 125 mg/dL (ref 0–200)
HDL: 33 mg/dL — ABNORMAL LOW (ref >40)
LDL Cholesterol: 72 mg/dL (ref 0–99)
Triglycerides: 99 mg/dL (ref <150)
VLDL: 20 mg/dL (ref 0–40)

## 2017-07-09 LAB — GLUCOSE, CAPILLARY
GLUCOSE-CAPILLARY: 85 mg/dL (ref 65–99)
Glucose-Capillary: 108 mg/dL — ABNORMAL HIGH (ref 65–99)
Glucose-Capillary: 122 mg/dL — ABNORMAL HIGH (ref 65–99)

## 2017-07-09 LAB — ECHOCARDIOGRAM COMPLETE
HEIGHTINCHES: 65 in
WEIGHTICAEL: 3406.4 [oz_av]

## 2017-07-09 LAB — HEMOGLOBIN A1C
Hgb A1c MFr Bld: 7.3 % — ABNORMAL HIGH (ref 4.8–5.6)
Mean Plasma Glucose: 162.81 mg/dL

## 2017-07-09 MED ORDER — LORAZEPAM 2 MG/ML IJ SOLN
2.0000 mg | Freq: Once | INTRAMUSCULAR | Status: AC
  Administered 2017-07-09: 2 mg via INTRAVENOUS
  Filled 2017-07-09: qty 1

## 2017-07-09 MED ORDER — INSULIN ASPART 100 UNIT/ML ~~LOC~~ SOLN: 0.0000 [IU] | Freq: Every day | SUBCUTANEOUS | Status: DC

## 2017-07-09 MED ORDER — INSULIN ASPART 100 UNIT/ML ~~LOC~~ SOLN: 0.0000 [IU] | Freq: Three times a day (TID) | SUBCUTANEOUS | Status: DC

## 2017-07-09 MED ORDER — LORAZEPAM 2 MG/ML IJ SOLN
1.0000 mg | Freq: Once | INTRAMUSCULAR | Status: AC
  Administered 2017-07-09: 01:00:00 1 mg via INTRAVENOUS
  Filled 2017-07-09: qty 1

## 2017-07-09 MED ORDER — ASPIRIN 81 MG PO CHEW
81.0000 mg | CHEWABLE_TABLET | Freq: Every day | ORAL | Status: DC
  Administered 2017-07-10: 08:00:00 81 mg via ORAL
  Filled 2017-07-09: qty 1

## 2017-07-09 NOTE — Progress Notes (Signed)
SLP Cancellation Note  Patient Details Name: Kelly Romero MRN: [id number] DOB: 04/30/1965   Cancelled treatment:       Reason Eval/Treat Not Completed: Patient at procedure or test/unavailable(chart reviewed; consulted NSG re: pt's status today.). NSG reported she had had conversations w/ pt earlier this morning w/ NO gross speech-language deficits noted. However, pt has since received Ativan x2 in order to complete her MRI. Pt would NOT be appropriate for a Cognitive-linguistic evaluation at this time d/t medication impact.  Unsure if pt has any Cognitive-linguistic deficits as NSG has not noted gross deficits, but ST services will f/u tomorrow w/ assessment as indicated. NSG agreed. CM updated.   Jerilynn SomKatherine Jersey Ravenscroft, MS, CCC-SLP Carley Glendenning 07/09/2017, 12:10 PM

## 2017-07-09 NOTE — Progress Notes (Signed)
[facility] - [place] at [facility]   PATIENT NAME: Kelly Romero    MR#:  [id number]  DATE OF BIRTH:  September 15, 1965  SUBJECTIVE:   Patient still with weakness and speech difficulty  Could not obtain MRI she is claustrophobic  REVIEW OF SYSTEMS:    Review of Systems  Constitutional: Negative for fever, chills weight loss HENT: Negative for ear pain, nosebleeds, congestion, facial swelling, rhinorrhea, neck pain, neck stiffness and ear discharge.   Respiratory: Negative for cough, shortness of breath, wheezing  Cardiovascular: Negative for chest pain, palpitations and leg swelling.  Gastrointestinal: Negative for heartburn, abdominal pain, vomiting, diarrhea or consitpation Genitourinary: Negative for dysuria, urgency, frequency, hematuria Musculoskeletal: Negative for back pain or joint pain Neurological: Right-sided weakness and speech difficulty Hematological: Does not bruise/bleed easily.  Psychiatric/Behavioral: Negative for hallucinations, confusion, dysphoric mood    Tolerating Diet: yes      DRUG ALLERGIES:   Allergies  Allergen Reactions  . Erythromycin Itching  . Latex     itch    VITALS:  Blood pressure (!) 168/65, pulse 82, temperature 97.9 F (36.6 C), temperature source Oral, resp. rate 16, height 5\' 5"  (1.651 m), weight 96.6 kg (212 lb 14.4 oz), SpO2 98 %.  PHYSICAL EXAMINATION:  Constitutional: Appears well-developed and well-nourished. No distress. HENT: Normocephalic. Marland Kitchen Oropharynx is clear and moist.  Eyes: Conjunctivae and EOM are normal. PERRLA, no scleral icterus.  Neck: Normal ROM. Neck supple. No JVD. No tracheal deviation. CVS: RRR, S1/S2 +, no murmurs, no gallops, no carotid bruit.  Pulmonary: Effort and breath sounds normal, no stridor, rhonchi, wheezes, rales.  Abdominal: Soft. BS +,  no distension, tenderness, rebound or guarding.  Musculoskeletal: Normal range of motion. No edema and no tenderness.  Neuro: Alert.   Right facial droop 4 out of 5 strength right upper extremity mild aphasia noted  skin: Skin is warm and dry. No rash noted. Psychiatric: Normal mood and affect.      LABORATORY PANEL:   CBC Recent Labs  Lab 07/08/17 1224  WBC 5.3  HGB 10.2*  HCT 31.0*  PLT 276   ------------------------------------------------------------------------------------------------------------------  Chemistries  Recent Labs  Lab 07/08/17 1224  NA 139  K 4.2  CL 106  CO2 28  GLUCOSE 175*  BUN 24*  CREATININE 1.23*  CALCIUM 9.1  AST 20  ALT 12*  ALKPHOS 77  BILITOT 0.5   ------------------------------------------------------------------------------------------------------------------  Cardiac Enzymes Recent Labs  Lab 07/08/17 1224  TROPONINI <0.03   ------------------------------------------------------------------------------------------------------------------  RADIOLOGY:  Ct Head Wo Contrast  Result Date: 07/08/2017 CLINICAL DATA:  Weakness and slurred speech for 1 day EXAM: CT HEAD WITHOUT CONTRAST TECHNIQUE: Contiguous axial images were obtained from the base of the skull through the vertex without intravenous contrast. COMPARISON:  04/27/2017 FINDINGS: Brain: Old left PCA infarct is identified. Scattered lacunar infarcts are again identified and stable. No findings to suggest acute hemorrhage, acute infarction or space-occupying lesion are noted. Vascular: No hyperdense vessel or unexpected calcification. Skull: Normal. Negative for fracture or focal lesion. Sinuses/Orbits: No acute finding. Other: None. IMPRESSION: Chronic ischemic changes are noted.  No acute abnormality noted. Electronically Signed   By: Alcide Clever M.D.   On: 07/08/2017 12:37   US Carotid Bilateral (at [facility] Only)  Result Date: 07/09/2017 CLINICAL DATA:  52 year old female with a history of right-sided cerebrovascular accident EXAM: BILATERAL CAROTID DUPLEX ULTRASOUND TECHNIQUE: Wallace Cullens scale imaging, color  Doppler and duplex ultrasound were performed of bilateral carotid and vertebral arteries in  the neck. COMPARISON:  Prior duplex carotid ultrasound 10/02/2016 FINDINGS: Criteria: Quantification of carotid stenosis is based on velocity parameters that correlate the residual internal carotid diameter with NASCET-based stenosis levels, using the diameter of the distal internal carotid lumen as the denominator for stenosis measurement. The following velocity measurements were obtained: RIGHT ICA: 115/30 cm/sec CCA: 86/15 cm/sec SYSTOLIC ICA/CCA RATIO:  1.0 ECA:  93 cm/sec LEFT ICA: 98/30 cm/sec CCA: 86/16 cm/sec SYSTOLIC ICA/CCA RATIO:  1.1 ECA:  122 cm/sec RIGHT CAROTID ARTERY: Moderate heterogeneous and slightly irregular atherosclerotic plaque in the proximal internal carotid artery. By peak systolic velocity criteria, the estimated stenosis remains less than 50%. RIGHT VERTEBRAL ARTERY:  Patent with normal antegrade flow. LEFT CAROTID ARTERY: Trace heterogeneous and relatively smooth atherosclerotic plaque in the proximal internal carotid artery. By peak systolic velocity criteria, the estimated stenosis remains less than 50%. LEFT VERTEBRAL ARTERY:  Patent with normal antegrade flow. IMPRESSION: 1. Mild (1-49%) stenosis proximal right internal carotid artery secondary to moderate heterogeneous and slightly irregular atherosclerotic plaque. 2. Mild (1-49%) stenosis proximal left internal carotid artery secondary to minimal heterogeneous but smooth atherosclerotic plaque. 3. Vertebral arteries are patent with normal antegrade flow. Signed, Sterling BigHeath K. McCullough, MD [facility] Electronically Signed   By: Malachy MoanHeath  McCullough M.D.   On: 07/09/2017 09:05     ASSESSMENT AND PLAN:   52 year old female with history of TIA who presents with right upper extremity weakness and aphasia.  1.  Right upper extremity and aphasia concerning for CVA Carotid Doppler  showed no hemodynamically significant stenosis Echocardiogram pending Ativan 2 mg prior to MRI PT, OTand neurology consultation pending Continue aspirin, statin and Plavix LDL 72  Hold blood pressure medications to allow hyper perfusion  2.  Diabetes: Continue sliding scale and Levemir  3.  Hyperlipidemia: Continue statin   Management plans discussed with the patient and she is in agreement.  CODE STATUS: Full  TOTAL TIME TAKING CARE OF THIS PATIENT: 30 minutes.     POSSIBLE D/C tomorrow, DEPENDING ON CLINICAL CONDITION.   Kissy Cielo M.D on 07/09/2017 at 10:20 AM  Between 7am to 6pm - Pager - [phone] After 6pm go to [email or web address] - password [email or web address]  [facility]  Office  [phone]  CC: Primary care physician; Noni Saupeedding, [name], MD  Note: This dictation was prepared with Dragon dictation along with smaller phrase technology. Any transcriptional errors that result from this process are unintentional.

## 2017-07-09 NOTE — Evaluation (Signed)
Occupational Therapy Evaluation Patient Details Name: Kelly Romero MRN: [id number] DOB: 1965-06-10 Today's Date: 07/09/2017    History of Present Illness Pt. is a 52 y.o. female who was admitted to [facility] with Aphasia, RUE weakness, slurred speech, and difficulty with word finding. Pt. has a PMHx of CVA, TIA, DM, Headache, Hyperlipidemia, HTN, TIA, Appendectomy, Cholecystectomy, Hernia Repair.   Clinical Impression   Pt. Presents with weakness, limited activity tolerance, history of falls, and limited functional mobility which limits the ability to complete basic ADL and IADL functioning. Pt. has had multiple hospital admissions. Pt. And son-in-law report a history of multiple falls. Pt. resides at home with  Her daughter, son-in-law, and grandchildren. Pt. required assist for basic  ADL care tasks from her daughter, and granddaughter. Pt.'s daughter assists with meal preparation, medication management, and performing home management, and household tasks.  Pt. has a lift chair, and shower chair at home. Pt. Could benefit from OT services for ADL training, neuromuscular re-education, A/E training, and pt. education about home modification, and DME. Pt. could benefit from [facility] follow-up services upon discharge.     Follow Up Recommendations  Home health OT    Equipment Recommendations       Recommendations for Other Services       Precautions / Restrictions Precautions Precautions: None Restrictions Weight Bearing Restrictions: No      Mobility Bed Mobility               General bed mobility comments: Pt. up in chair upon arrival  Transfers                 General transfer comment: Mobility: Deferred.    Balance                                           ADL either performed or assessed with clinical judgement   ADL Overall ADL's : Needs assistance/impaired Eating/Feeding: Set up;Independent   Grooming: Minimal assistance;Set up   Upper  Body Bathing: Set up;Minimal assistance   Lower Body Bathing: Set up;Moderate assistance   Upper Body Dressing : Set up;Minimal assistance   Lower Body Dressing: Set up;Moderate assistance                       Vision Baseline Vision/History: No visual deficits;Wears glasses Wears Glasses: Reading only       Perception     Praxis      Pertinent Vitals/Pain Pain Assessment: No/denies pain     Hand Dominance Right   Extremity/Trunk Assessment Upper Extremity Assessment Upper Extremity Assessment: Generalized weakness(intact proprioceptive awareness, and sensation to light touch.)           Communication Communication Communication: Expressive difficulties   Cognition Arousal/Alertness: Awake/alert Behavior During Therapy: WFL for tasks assessed/performed Overall Cognitive Status: Within Functional Limits for tasks assessed                                     General Comments       Exercises     Shoulder Instructions      Home Living Family/patient expects to be discharged to:: Private residence Living Arrangements: Children(Daughter, son-in-law, and grandchildren.) Available Help at Discharge: Family Type of Home: House Home Access: Stairs to enter [employer] of Steps: 3  steps Entrance Stairs-Rails: Right;Left Home Layout: One level     Bathroom Shower/Tub: Tub/shower unit         Home Equipment: TEFL teacherhower seat(Lift chair recliner)          Prior Functioning/Environment Level of Independence: Needs assistance    ADL's / Homemaking Assistance Needed: Daughter assist pt. with bathing. Daughter and grandaughter assist pt. with dressing. Pt. faimly assist pt. with meal preparation, and managing medicine.             OT Problem List: Decreased strength;Decreased activity tolerance;Decreased coordination;Pain;Impaired UE functional use;Decreased knowledge of use of DME or AE      OT Treatment/Interventions:  Self-care/ADL training;Therapeutic exercise;Neuromuscular education;Patient/family education;DME and/or AE instruction;Therapeutic activities    OT Goals(Current goals can be found in the care plan section) Acute Rehab OT Goals Patient Stated Goal: To retun home OT Goal Formulation: With patient Potential to Achieve Goals: Good  OT Frequency: Min 2X/week   Barriers to D/C:            Co-evaluation              AM-PAC PT "6 Clicks" Daily Activity     Outcome Measure Help from another person eating meals?: None Help from another person taking care of personal grooming?: A Little Help from another person toileting, which includes using toliet, bedpan, or urinal?: A Little Help from another person bathing (including washing, rinsing, drying)?: A Lot Help from another person to put on and taking off regular upper body clothing?: A Little Help from another person to put on and taking off regular lower body clothing?: A Lot 6 Click Score: 17   End of Session Equipment Utilized During Treatment: Gait belt  Activity Tolerance: Patient tolerated treatment well Patient left: in bed  OT Visit Diagnosis: Unsteadiness on feet (R26.81);Muscle weakness (generalized) (M62.81);Repeated falls (R29.6);History of falling (Z91.81)                Time: [id number] OT Time Calculation (min): 27 min Charges:  OT General Charges $OT Visit: 1 Visit OT Evaluation $OT Eval Moderate Complexity: 1 Mod G-Codes:     Olegario MessierElaine Maddisen Vought, MS, OTR/L   Olegario MessierElaine Kapena Hamme, MS, OTR/L 07/09/2017, 1:00 PM

## 2017-07-09 NOTE — Progress Notes (Signed)
*  PRELIMINARY RESULTS* Echocardiogram 2D Echocardiogram has been performed.  Kelly Romero, Kelly Romero 07/09/2017, 3:23 PM

## 2017-07-09 NOTE — Consult Note (Signed)
Referring Physician: Mody    Chief Complaint: Slurred speech and facial droop  HPI: Kelly Romero is an 52 y.o. female with a history of stroke presenting with slurred speech and facial droop.  Patient with an episode of slurred speech and facial droop that occurred on 4/24.  Symptoms resolved spontaneously by 7p.  It seems they recurred at some point prior to 11p.  When the family returned home at that time the symptoms were present.  Symptoms did not resolve and patient presented on yesterday for evaluation.   Initial NIHSS of 4.   Patient last admitted to [facility] in February with slurred speech.  CTA of head showed multiple stable segments of mild to moderate stenosis involving the ACA, MCA and PCA distributions as well as stable severe stenosis of left M2.  Compared to prior CTA from July 2018, she demonstrated interval occlusion of right PCA distal calcarine branch.  CTA of neck showed bilateral 60% proximal ICA stenosis.  MRI of brain with and without contrast showed multiple subacute infarcts involving the the right lateral lenticulostriate territory, right parietal region and cortical cerebral hemispheres.  TTE showed EF 65-70% with negative bubble study.  LDL was 347.  Hgb A1c was 13.6.  She reportedly had been noncompliant with her medications.  Reports being compliant at this time.  On ASA and Plavix.  Has had TEE, hypercoagulable and autoimmune work up that were unremarkable.   Patient was being referred to Dr. Pearlean Brownie (stroke specialist).    Date last known well: Date: 07/07/2017 Time last known well: Time: 19:00 tPA Given: No: Outside time window  Past Medical History:  Diagnosis Date  . Diabetes mellitus without complication (HCC)   . Headache   . Hyperlipidemia   . Hypertension   . Stroke (HCC)   . TIA (transient ischemic attack)     Past Surgical History:  Procedure Laterality Date  . APPENDECTOMY    . CHOLECYSTECTOMY    . FOOT GANGLION EXCISION    . HERNIA REPAIR     . IR GENERIC HISTORICAL  12/30/2015   IR ANGIO INTRA EXTRACRAN SEL COM CAROTID INNOMINATE BILAT MOD SED 12/30/2015 Julieanne Cotton, MD [facility]  . IR GENERIC HISTORICAL  12/30/2015   IR ANGIO VERTEBRAL SEL VERTEBRAL BILAT MOD SED 12/30/2015 Julieanne Cotton, MD [facility]  . TEE WITHOUT CARDIOVERSION N/A 04/19/2015   Procedure: TRANSESOPHAGEAL ECHOCARDIOGRAM (TEE);  Surgeon: Jake Bathe, MD;  Location: [facility] ENDOSCOPY;  Service: Cardiovascular;  Laterality: N/A;  . VAGINAL HYSTERECTOMY      Family History  Problem Relation Age of Onset  . Stroke Father        64s  . Heart attack Father 34  . COPD Mother   . Heart failure Brother   . Cancer Maternal Grandmother        unknown   . COPD Unknown   . Heart failure Maternal Grandfather   . Hypertension Maternal Grandfather   . Cancer Paternal Grandfather        lung  . Diabetes Paternal Grandmother    Social History:  reports that she has never smoked. She has never used smokeless tobacco. She reports that she does not drink alcohol or use drugs.  Allergies:  Allergies  Allergen Reactions  . Erythromycin Itching  . Latex     itch    Medications:  I have reviewed the patient's current medications. Prior to Admission:  Medications Prior to Admission  Medication Sig Dispense Refill Last Dose  .  aspirin EC 81 MG tablet Take 1 tablet by mouth every morning.   07/07/2017 at 0800  . clopidogrel (PLAVIX) 75 MG tablet Take 1 tablet (75 mg total) by mouth daily. 30 tablet 11 07/07/2017 at 0800  . glimepiride (AMARYL) 2 MG tablet Take 2 mg by mouth daily.   07/07/2017 at 0800  . insulin detemir (LEVEMIR) 100 UNIT/ML injection Inject 85 Units into the skin every morning.    Unknown at Unknown  . lisinopril (PRINIVIL,ZESTRIL) 40 MG tablet Take 1 tablet (40 mg total) by mouth daily. 30 tablet 3 07/07/2017 at 0800  . Magnesium 500 MG TABS Take 1 tablet by mouth every evening.   07/07/2017 at 1900  . Riboflavin (B2) 100 MG TABS Take 200  mg by mouth every morning.   07/07/2017 at 0800  . rosuvastatin (CRESTOR) 40 MG tablet Take 1 tablet (40 mg total) by mouth at bedtime. 30 tablet 3 07/07/2017 at 2000  . divalproex (DEPAKOTE) 500 MG DR tablet Take 1 tablet (500 mg total) by mouth 2 (two) times daily. (Patient not taking: Reported on 07/08/2017) 60 tablet 3 Not Taking at Unknown time  . Evolocumab (REPATHA SURECLICK) 140 MG/ML SOAJ Inject 140 mg into the skin every 14 (fourteen) days. (Patient not taking: Reported on 07/08/2017) 2 pen 11 Not Taking at Unknown time   Scheduled: . aspirin EC  81 mg Oral [id number]  . clopidogrel  75 mg Oral Daily  . divalproex  500 mg Oral BID  . enoxaparin (LOVENOX) injection  40 mg Subcutaneous Q24H  . insulin aspart  0-5 Units Subcutaneous QHS  . insulin aspart  0-9 Units Subcutaneous TID WC  . insulin detemir  40 Units Subcutaneous QHS  . rosuvastatin  40 mg Oral QHS    ROS: History obtained from the patient  General ROS: negative for - chills, fatigue, fever, night sweats, weight gain or weight loss Psychological ROS: negative for - behavioral disorder, hallucinations, memory difficulties, mood swings or suicidal ideation Ophthalmic ROS: negative for - blurry vision, double vision, eye pain or loss of vision ENT ROS: negative for - epistaxis, nasal discharge, oral lesions, sore throat, tinnitus or vertigo Allergy and Immunology ROS: negative for - hives or itchy/watery eyes Hematological and Lymphatic ROS: negative for - bleeding problems, bruising or swollen lymph nodes Endocrine ROS: negative for - galactorrhea, hair pattern changes, polydipsia/polyuria or temperature intolerance Respiratory ROS: negative for - cough, hemoptysis, shortness of breath or wheezing Cardiovascular ROS: negative for - chest pain, dyspnea on exertion, edema or irregular heartbeat Gastrointestinal ROS: negative for - abdominal pain, diarrhea, hematemesis, nausea/vomiting or stool incontinence Genito-Urinary ROS:  negative for - dysuria, hematuria, incontinence or urinary frequency/urgency Musculoskeletal ROS: negative for - joint swelling or muscular weakness Neurological ROS: as noted in HPI Dermatological ROS: negative for rash and skin lesion changes  Physical Examination: Blood pressure (!) 168/65, pulse 82, temperature 97.9 F (36.6 C), temperature source Oral, resp. rate 16, height 5\' 5"  (1.651 m), weight 96.6 kg (212 lb 14.4 oz), SpO2 98 %.  HEENT-  Normocephalic, no lesions, without obvious abnormality.  Normal external eye and conjunctiva.  Normal TM's bilaterally.  Normal auditory canals and external ears. Normal external nose, mucus membranes and septum.  Normal pharynx. Cardiovascular- S1, S2 normal, pulses palpable throughout   Lungs- chest clear, no wheezing, rales, normal symmetric air entry Abdomen- soft, non-tender; bowel sounds normal; no masses,  no organomegaly Extremities- no edema Lymph-no adenopathy palpable Musculoskeletal-no joint tenderness, deformity or swelling Skin-petechiae on  the right hand and forearm  Neurological Examination   Mental Status: Lethargic due to Ativan, oriented, thought content appropriate.  Speech fluent without evidence of aphasia.  Able to follow 3 step commands without difficulty. Cranial Nerves: II: Discs flat bilaterally; RHH, pupils equal, round, reactive to light and accommodation III,IV, VI: ptosis not present, extra-ocular motions intact bilaterally V,VII: mild right facial droop, facial light touch sensation normal bilaterally VIII: hearing normal bilaterally IX,X: gag reflex present XI: bilateral shoulder shrug XII: midline tongue extension Motor: Right : Upper extremity   5/5    Left:     Upper extremity   5/5  Lower extremity   5/5     Lower extremity   5/5 Tone and bulk:normal tone throughout; no atrophy noted Sensory: Pinprick and light touch intact throughout, bilaterally Deep Tendon Reflexes: 2+ and symmetric with absent AJ's  bilaterally Plantars: Right: mute   Left: mute Cerebellar: Normal finger-to-nose testing Gait: not tested due to safety concerns    Laboratory Studies:  Basic Metabolic Panel: Recent Labs  Lab 07/08/17 1224  NA 139  K 4.2  CL 106  CO2 28  GLUCOSE 175*  BUN 24*  CREATININE 1.23*  CALCIUM 9.1    Liver Function Tests: Recent Labs  Lab 07/08/17 1224  AST 20  ALT 12*  ALKPHOS 77  BILITOT 0.5  PROT 6.9  ALBUMIN 3.4*   No results for input(s): LIPASE, AMYLASE in the last 168 hours. No results for input(s): AMMONIA in the last 168 hours.  CBC: Recent Labs  Lab 07/08/17 1224  WBC 5.3  NEUTROABS 3.6  HGB 10.2*  HCT 31.0*  MCV 87.4  PLT 276    Cardiac Enzymes: Recent Labs  Lab 07/08/17 1224  TROPONINI <0.03    BNP: Invalid input(s): POCBNP  CBG: Recent Labs  Lab 07/08/17 2241 07/09/17 1134  GLUCAP 118* 108*    Microbiology: Results for orders placed or performed during the hospital encounter of 12/26/15  Urine culture     Status: Abnormal   Collection Time: 12/26/15  6:50 AM  Result Value Ref Range Status   Specimen Description URINE, RANDOM  Final   Special Requests NONE  Final   Culture MULTIPLE SPECIES PRESENT, SUGGEST RECOLLECTION (A)  Final   Report Status 12/27/2015 FINAL  Final  Blood Culture (routine x 2)     Status: None   Collection Time: 12/26/15  7:20 AM  Result Value Ref Range Status   Specimen Description BLOOD RIGHT ANTECUBITAL  Final   Special Requests   Final    BOTTLES DRAWN AEROBIC AND ANAEROBIC 10CC AER 5CC ANA   Culture NO GROWTH 5 DAYS  Final   Report Status 12/31/2015 FINAL  Final  Blood Culture (routine x 2)     Status: Abnormal   Collection Time: 12/26/15  7:43 AM  Result Value Ref Range Status   Specimen Description BLOOD RIGHT HAND  Final   Special Requests BOTTLES DRAWN AEROBIC AND ANAEROBIC 10CC  Final   Culture  Setup Time   Final    GRAM POSITIVE RODS ANAEROBIC BOTTLE ONLY Organism ID to follow CRITICAL  RESULT CALLED TO, READ BACK BY AND VERIFIED WITH: M.TURNER,PHARMD 12/28/15 @2122  BY V.WILKINS    Culture (A)  Final    DIPHTHEROIDS(CORYNEBACTERIUM SPECIES) Standardized susceptibility testing for this organism is not available.    Report Status 12/30/2015 FINAL  Final  Blood Culture ID Panel (Reflexed)     Status: None   Collection Time: 12/26/15  7:43 AM  Result Value Ref Range Status   Enterococcus species NOT DETECTED NOT DETECTED Final   Listeria monocytogenes NOT DETECTED NOT DETECTED Final   Staphylococcus species NOT DETECTED NOT DETECTED Final   Staphylococcus aureus NOT DETECTED NOT DETECTED Final   Streptococcus species NOT DETECTED NOT DETECTED Final   Streptococcus agalactiae NOT DETECTED NOT DETECTED Final   Streptococcus pneumoniae NOT DETECTED NOT DETECTED Final   Streptococcus pyogenes NOT DETECTED NOT DETECTED Final   Acinetobacter baumannii NOT DETECTED NOT DETECTED Final   Enterobacteriaceae species NOT DETECTED NOT DETECTED Final   Enterobacter cloacae complex NOT DETECTED NOT DETECTED Final   Escherichia coli NOT DETECTED NOT DETECTED Final   Klebsiella oxytoca NOT DETECTED NOT DETECTED Final   Klebsiella pneumoniae NOT DETECTED NOT DETECTED Final   Proteus species NOT DETECTED NOT DETECTED Final   Serratia marcescens NOT DETECTED NOT DETECTED Final   Haemophilus influenzae NOT DETECTED NOT DETECTED Final   Neisseria meningitidis NOT DETECTED NOT DETECTED Final   Pseudomonas aeruginosa NOT DETECTED NOT DETECTED Final   Candida albicans NOT DETECTED NOT DETECTED Final   Candida glabrata NOT DETECTED NOT DETECTED Final   Candida krusei NOT DETECTED NOT DETECTED Final   Candida parapsilosis NOT DETECTED NOT DETECTED Final   Candida tropicalis NOT DETECTED NOT DETECTED Final  MRSA PCR Screening     Status: None   Collection Time: 12/26/15  3:12 PM  Result Value Ref Range Status   MRSA by PCR NEGATIVE NEGATIVE Final    Comment:        The GeneXpert MRSA Assay  (FDA approved for NASAL specimens only), is one component of a comprehensive MRSA colonization surveillance program. It is not intended to diagnose MRSA infection nor to guide or monitor treatment for MRSA infections.   Urine culture     Status: Abnormal   Collection Time: 12/27/15 10:33 PM  Result Value Ref Range Status   Specimen Description URINE, CATHETERIZED  Final   Special Requests NONE  Final   Culture 50,000 COLONIES/mL YEAST (A)  Final   Report Status 12/29/2015 FINAL  Final  Gram stain     Status: None   Collection Time: 12/28/15  4:11 PM  Result Value Ref Range Status   Specimen Description CSF  Final   Special Requests NONE  Final   Gram Stain CYTOSPIN SLIDE NO WBC SEEN NO ORGANISMS SEEN   Final   Report Status 12/28/2015 FINAL  Final  Culture, fungus without smear     Status: None   Collection Time: 12/28/15  4:11 PM  Result Value Ref Range Status   Specimen Description CSF  Final   Special Requests NONE  Final   Culture NO FUNGUS ISOLATED AFTER 21 DAYS  Final   Report Status 01/19/2016 FINAL  Final  Aerobic/Anaerobic Culture (surgical/deep wound)     Status: None   Collection Time: 12/28/15  8:47 PM  Result Value Ref Range Status   Specimen Description CSF  Final   Special Requests NONE  Final   Culture No growth aerobically or anaerobically.  Final   Report Status 01/02/2016 FINAL  Final    Coagulation Studies: Recent Labs    07/08/17 1224  LABPROT 12.1  INR 0.90    Urinalysis:  Recent Labs  Lab 07/08/17 1211  COLORURINE YELLOW*  LABSPEC 1.009  PHURINE 6.0  GLUCOSEU NEGATIVE  HGBUR NEGATIVE  BILIRUBINUR NEGATIVE  KETONESUR NEGATIVE  PROTEINUR NEGATIVE  NITRITE NEGATIVE  LEUKOCYTESUR TRACE*    Lipid Panel:  Component Value Date/Time   CHOL 125 07/09/2017 0633   CHOL 329 (H) 11/06/2016 1040   TRIG 99 07/09/2017 0633   HDL 33 (L) 07/09/2017 0633   HDL 50 11/06/2016 1040   CHOLHDL 3.8 07/09/2017 0633   VLDL 20 07/09/2017 0633    LDLCALC 72 07/09/2017 0633   LDLCALC 229 (H) 11/06/2016 1040    HgbA1C:  Lab Results  Component Value Date   HGBA1C 7.3 (H) 07/09/2017    Urine Drug Screen:      Component Value Date/Time   LABOPIA NONE DETECTED 12/26/2015 0650   COCAINSCRNUR NONE DETECTED 12/26/2015 0650   LABBENZ NONE DETECTED 12/26/2015 0650   AMPHETMU NONE DETECTED 12/26/2015 0650   THCU NONE DETECTED 12/26/2015 0650   LABBARB NONE DETECTED 12/26/2015 0650    Alcohol Level: No results for input(s): ETH in the last 168 hours.  Other results: EKG: sinus rhythm at 81 bpm.  Imaging: Ct Head Wo Contrast  Result Date: 07/08/2017 CLINICAL DATA:  Weakness and slurred speech for 1 day EXAM: CT HEAD WITHOUT CONTRAST TECHNIQUE: Contiguous axial images were obtained from the base of the skull through the vertex without intravenous contrast. COMPARISON:  04/27/2017 FINDINGS: Brain: Old left PCA infarct is identified. Scattered lacunar infarcts are again identified and stable. No findings to suggest acute hemorrhage, acute infarction or space-occupying lesion are noted. Vascular: No hyperdense vessel or unexpected calcification. Skull: Normal. Negative for fracture or focal lesion. Sinuses/Orbits: No acute finding. Other: None. IMPRESSION: Chronic ischemic changes are noted.  No acute abnormality noted. Electronically Signed   By: Alcide Clever M.D.   On: 07/08/2017 12:37   Mr Brain Wo Contrast  Result Date: 07/09/2017 CLINICAL DATA:  Stroke aphasia.  Right-sided weakness EXAM: MRI HEAD WITHOUT CONTRAST MRA HEAD WITHOUT CONTRAST TECHNIQUE: Multiplanar, multiecho pulse sequences of the brain and surrounding structures were obtained without intravenous contrast. Angiographic images of the head were obtained using MRA technique without contrast. COMPARISON:  CT head one thousand nineteen FINDINGS: MRI HEAD FINDINGS Brain: Small areas of acute infarction in the left corona radiata and left frontal white matter. Chronic infarct  left occipital parietal lobe. Chronic microvascular ischemic change in the white matter bilaterally. Negative for hemorrhage mass or edema. Vascular: Normal arterial flow voids Skull and upper cervical spine: Negative Sinuses/Orbits: Mucosal edema right maxillary sinus. Bilateral cataract removal Other: None MRA HEAD FINDINGS Image quality degraded by motion Moderately severe stenosis distal vertebral artery bilaterally. Mild to moderate stenosis proximal basilar. Cerebellar arteries not well seen due to motion. Severe stenosis proximal right posterior cerebral artery. Occlusion mid left posterior cerebral artery. Fetal origin left posterior cerebral artery. Atherosclerotic irregularity and mild moderate stenosis in the cavernous carotid bilaterally. Diffuse atherosclerotic disease and middle cerebral arteries bilaterally. Both anterior cerebral arteries are patent. IMPRESSION: Small areas of acute infarct in the deep white matter on the left Moderate chronic ischemic changes in the white matter and left occipital parietal lobe. MRA degraded by motion. Diffuse intracranial atherosclerotic disease. Electronically Signed   By: Marlan Palau M.D.   On: 07/09/2017 12:31   US Carotid Bilateral (at [facility] And [facility] Only)  Result Date: 07/09/2017 CLINICAL DATA:  52 year old female with a history of right-sided cerebrovascular accident EXAM: BILATERAL CAROTID DUPLEX ULTRASOUND TECHNIQUE: Wallace Cullens scale imaging, color Doppler and duplex ultrasound were performed of bilateral carotid and vertebral arteries in the neck. COMPARISON:  Prior duplex carotid ultrasound 10/02/2016 FINDINGS: Criteria: Quantification of carotid stenosis is based on velocity parameters that correlate the residual  internal carotid diameter with NASCET-based stenosis levels, using the diameter of the distal internal carotid lumen as the denominator for stenosis measurement. The following velocity measurements were obtained: RIGHT ICA: 115/30 cm/sec CCA:  86/15 cm/sec SYSTOLIC ICA/CCA RATIO:  1.0 ECA:  93 cm/sec LEFT ICA: 98/30 cm/sec CCA: 86/16 cm/sec SYSTOLIC ICA/CCA RATIO:  1.1 ECA:  122 cm/sec RIGHT CAROTID ARTERY: Moderate heterogeneous and slightly irregular atherosclerotic plaque in the proximal internal carotid artery. By peak systolic velocity criteria, the estimated stenosis remains less than 50%. RIGHT VERTEBRAL ARTERY:  Patent with normal antegrade flow. LEFT CAROTID ARTERY: Trace heterogeneous and relatively smooth atherosclerotic plaque in the proximal internal carotid artery. By peak systolic velocity criteria, the estimated stenosis remains less than 50%. LEFT VERTEBRAL ARTERY:  Patent with normal antegrade flow. IMPRESSION: 1. Mild (1-49%) stenosis proximal right internal carotid artery secondary to moderate heterogeneous and slightly irregular atherosclerotic plaque. 2. Mild (1-49%) stenosis proximal left internal carotid artery secondary to minimal heterogeneous but smooth atherosclerotic plaque. 3. Vertebral arteries are patent with normal antegrade flow. Signed, Sterling Big, MD [facility] Electronically Signed   By: Malachy Moan M.D.   On: 07/09/2017 09:05   Mr Maxine Glenn Head/brain ZO Cm  Result Date: 07/09/2017 CLINICAL DATA:  Stroke aphasia.  Right-sided weakness EXAM: MRI HEAD WITHOUT CONTRAST MRA HEAD WITHOUT CONTRAST TECHNIQUE: Multiplanar, multiecho pulse sequences of the brain and surrounding structures were obtained without intravenous contrast. Angiographic images of the head were obtained using MRA technique without contrast. COMPARISON:  CT head one thousand nineteen FINDINGS: MRI HEAD FINDINGS Brain: Small areas of acute infarction in the left corona radiata and left frontal white matter. Chronic infarct left occipital parietal lobe. Chronic microvascular ischemic change in the white matter bilaterally. Negative for hemorrhage mass or edema. Vascular: Normal  arterial flow voids Skull and upper cervical spine: Negative Sinuses/Orbits: Mucosal edema right maxillary sinus. Bilateral cataract removal Other: None MRA HEAD FINDINGS Image quality degraded by motion Moderately severe stenosis distal vertebral artery bilaterally. Mild to moderate stenosis proximal basilar. Cerebellar arteries not well seen due to motion. Severe stenosis proximal right posterior cerebral artery. Occlusion mid left posterior cerebral artery. Fetal origin left posterior cerebral artery. Atherosclerotic irregularity and mild moderate stenosis in the cavernous carotid bilaterally. Diffuse atherosclerotic disease and middle cerebral arteries bilaterally. Both anterior cerebral arteries are patent. IMPRESSION: Small areas of acute infarct in the deep white matter on the left Moderate chronic ischemic changes in the white matter and left occipital parietal lobe. MRA degraded by motion. Diffuse intracranial atherosclerotic disease. Electronically Signed   By: Marlan Palau M.D.   On: 07/09/2017 12:31    Assessment: 52 y.o. female with a history of cryptogenic stroke presenting with slurred speech and facial droop.  On ASA and Plavix.  MRI of the brain reviewed and shows small, acute infarcts in the left hemispheric deep white matter.  Carotid dopplers show no evidence of hemodynamically significant stenosis.  Echocardiogram pending.  A1c 7.3, LDL 72. Patient has had extensive work up in the past.  Would not repeat at this time.    Stroke Risk Factors - diabetes mellitus, hyperlipidemia and hypertension  Plan: 1. PT consult, OT consult, Speech consult 2. Prophylactic therapy-Continue ASA and Plavix 3. NPO until RN stroke swallow screen 4. Telemetry monitoring 5. Frequent neuro checks 6. Agree with evaluation by stroke specialist 7. Patient to be evaluated by cardiology for [facility] monitor.  Has had 30 day holter in the past that  was unremarkable.   7. A1c and LDL slightly elevated but much  improved from hospitalization on February.  Patient now complaint and expect further improvement in both with continued compliance.     Thana Farr, MD Neurology [phone] 07/09/2017, 2:38 PM

## 2017-07-09 NOTE — Plan of Care (Signed)
  Problem: Education: Goal: Knowledge of General Education information will improve Outcome: Progressing   Problem: Health Behavior/Discharge Planning: Goal: Ability to manage health-related needs will improve Outcome: Progressing   Problem: Clinical Measurements: Goal: Ability to maintain clinical measurements within normal limits will improve Outcome: Progressing Goal: Will remain free from infection Outcome: Progressing Goal: Diagnostic test results will improve Outcome: Progressing Goal: Respiratory complications will improve Outcome: Progressing Goal: Cardiovascular complication will be avoided Outcome: Progressing   Problem: Activity: Goal: Risk for activity intolerance will decrease Outcome: Progressing   Problem: Coping: Goal: Level of anxiety will decrease Outcome: Progressing   Problem: Elimination: Goal: Will not experience complications related to bowel motility Outcome: Progressing Goal: Will not experience complications related to urinary retention Outcome: Progressing   Problem: Pain Managment: Goal: General experience of comfort will improve Outcome: Progressing   Problem: Safety: Goal: Ability to remain free from injury will improve Outcome: Progressing   Problem: Skin Integrity: Goal: Risk for impaired skin integrity will decrease Outcome: Progressing   Problem: Education: Goal: Knowledge of disease or condition will improve Outcome: Progressing Goal: Knowledge of secondary prevention will improve Outcome: Progressing Goal: Knowledge of patient specific risk factors addressed and post discharge goals established will improve Outcome: Progressing   Problem: Self-Care: Goal: Ability to participate in self-care as condition permits will improve Outcome: Progressing   Problem: Nutrition: Goal: Risk of aspiration will decrease Outcome: Progressing

## 2017-07-09 NOTE — Plan of Care (Signed)
  Problem: Education: Goal: Knowledge of General Education information will improve 07/09/2017 2022 by Donnel SaxonKennedy, Yanet Balliet L, RN Outcome: Progressing 07/09/2017 1235 by Donnel SaxonKennedy, Blake Goya L, RN Outcome: Progressing   Problem: Health Behavior/Discharge Planning: Goal: Ability to manage health-related needs will improve 07/09/2017 2022 by Donnel SaxonKennedy, Tressie Ragin L, RN Outcome: Progressing 07/09/2017 1235 by Donnel SaxonKennedy, Blimy Napoleon L, RN Outcome: Progressing   Problem: Clinical Measurements: Goal: Ability to maintain clinical measurements within normal limits will improve 07/09/2017 2022 by Donnel SaxonKennedy, Nicoletta Hush L, RN Outcome: Progressing 07/09/2017 1235 by Donnel SaxonKennedy, Randell Detter L, RN Outcome: Progressing Goal: Will remain free from infection 07/09/2017 2022 by Donnel SaxonKennedy, Tekeisha Hakim L, RN Outcome: Progressing 07/09/2017 1235 by Donnel SaxonKennedy, Damion Kant L, RN Outcome: Progressing Goal: Diagnostic test results will improve 07/09/2017 2022 by Donnel SaxonKennedy, Sheridan Hew L, RN Outcome: Progressing 07/09/2017 1235 by Donnel SaxonKennedy, Shalina Norfolk L, RN Outcome: Progressing Goal: Respiratory complications will improve 07/09/2017 2022 by Donnel SaxonKennedy, Nikia Levels L, RN Outcome: Progressing 07/09/2017 1235 by Donnel SaxonKennedy, Raynold Blankenbaker L, RN Outcome: Progressing Goal: Cardiovascular complication will be avoided 07/09/2017 2022 by Donnel SaxonKennedy, Katryna Tschirhart L, RN Outcome: Progressing 07/09/2017 1235 by Donnel SaxonKennedy, Nakiea Metzner L, RN Outcome: Progressing   Problem: Activity: Goal: Risk for activity intolerance will decrease 07/09/2017 2022 by Donnel SaxonKennedy, Lore Polka L, RN Outcome: Progressing 07/09/2017 1235 by Donnel SaxonKennedy, Foye Damron L, RN Outcome: Progressing   Problem: Nutrition: Goal: Adequate nutrition will be maintained 07/09/2017 2022 by Donnel SaxonKennedy, Virtie Bungert L, RN Outcome: Progressing 07/09/2017 1235 by Donnel SaxonKennedy, Jordynne Mccown L, RN Outcome: Progressing   Problem: Coping: Goal: Level of anxiety will decrease 07/09/2017 2022 by Donnel SaxonKennedy, Noeh Sparacino L, RN Outcome:  Progressing 07/09/2017 1235 by Donnel SaxonKennedy, Ryelan Kazee L, RN Outcome: Progressing   Problem: Elimination: Goal: Will not experience complications related to bowel motility 07/09/2017 2022 by Donnel SaxonKennedy, Ociel Retherford L, RN Outcome: Progressing 07/09/2017 1235 by Donnel SaxonKennedy, Myrtle Barnhard L, RN Outcome: Progressing Goal: Will not experience complications related to urinary retention 07/09/2017 2022 by Donnel SaxonKennedy, Roselynn Whitacre L, RN Outcome: Progressing 07/09/2017 1235 by Donnel SaxonKennedy, Malisha Mabey L, RN Outcome: Progressing   Problem: Pain Managment: Goal: General experience of comfort will improve 07/09/2017 2022 by Donnel SaxonKennedy, Deamber Buckhalter L, RN Outcome: Progressing 07/09/2017 1235 by Donnel SaxonKennedy, Eunice Oldaker L, RN Outcome: Progressing   Problem: Safety: Goal: Ability to remain free from injury will improve 07/09/2017 2022 by Donnel SaxonKennedy, Monet North L, RN Outcome: Progressing 07/09/2017 1235 by Donnel SaxonKennedy, Charla Criscione L, RN Outcome: Progressing   Problem: Safety: Goal: Ability to remain free from injury will improve 07/09/2017 2022 by Donnel SaxonKennedy, Diane Hanel L, RN Outcome: Progressing 07/09/2017 1235 by Donnel SaxonKennedy, Mada Sadik L, RN Outcome: Progressing   Problem: Skin Integrity: Goal: Risk for impaired skin integrity will decrease 07/09/2017 2022 by Donnel SaxonKennedy, Trentyn Boisclair L, RN Outcome: Progressing 07/09/2017 1235 by Donnel SaxonKennedy, Lyndsey Demos L, RN Outcome: Progressing   Problem: Self-Care: Goal: Ability to participate in self-care as condition permits will improve 07/09/2017 2022 by Donnel SaxonKennedy, Lesley Atkin L, RN Outcome: Progressing 07/09/2017 1235 by Donnel SaxonKennedy, Mayli Covington L, RN Outcome: Progressing   Problem: Nutrition: Goal: Risk of aspiration will decrease 07/09/2017 2022 by Donnel SaxonKennedy, Monterius Rolf L, RN Outcome: Progressing 07/09/2017 1235 by Donnel SaxonKennedy, Umaiza Matusik L, RN Outcome: Progressing

## 2017-07-09 NOTE — Plan of Care (Signed)
  Problem: Education: Goal: Knowledge of General Education information will improve Outcome: Progressing   Problem: Health Behavior/Discharge Planning: Goal: Ability to manage health-related needs will improve Outcome: Progressing   Problem: Clinical Measurements: Goal: Will remain free from infection Outcome: Progressing   Problem: Activity: Goal: Risk for activity intolerance will decrease Outcome: Progressing   Problem: Nutrition: Goal: Adequate nutrition will be maintained Outcome: Progressing   Problem: Elimination: Goal: Will not experience complications related to bowel motility Outcome: Progressing Goal: Will not experience complications related to urinary retention Outcome: Progressing   Problem: Pain Managment: Goal: General experience of comfort will improve Outcome: Progressing   Problem: Safety: Goal: Ability to remain free from injury will improve Outcome: Progressing   Problem: Skin Integrity: Goal: Risk for impaired skin integrity will decrease Outcome: Progressing   Problem: Education: Goal: Knowledge of disease or condition will improve Outcome: Progressing Goal: Knowledge of secondary prevention will improve Outcome: Progressing Goal: Knowledge of patient specific risk factors addressed and post discharge goals established will improve Outcome: Progressing   Problem: Self-Care: Goal: Ability to participate in self-care as condition permits will improve Outcome: Progressing   Problem: Nutrition: Goal: Risk of aspiration will decrease Outcome: Progressing

## 2017-07-09 NOTE — Evaluation (Signed)
Physical Therapy Evaluation Patient Details Name: Kelly Romero MRN: [id number] DOB: 06/24/1965 Today's Date: 07/09/2017   History of Present Illness  Pt. is a 52 y.o. female who [facility] with Aphasia, RUE weakness, slurred speech, and difficulty with word finding. Pt. has a PMHx of CVA, TIA, DM, Headache, Hyperlipidemia, HTN, TIA, Appendectomy, Cholecystectomy, Hernia Repair.  Clinical Impression  Pt is a pleasant 52 year old female who was admitted for aphasia and positive CVA on L. Pt performs bed mobility with independence, transfers with cga, and ambulation with cga and RW. Presents with slow speech, has slight difficulty with comprehension, responds best to short sentences. Pt demonstrates deficits with balance/safety awareness/endurance. Coordination decreased on B LE with toe taps. Coordination intact in B UE. Sensation intact. Strength equal bilat and seems decreased globally. Recommend use of RW at all times at this point. Would benefit from skilled PT to address above deficits and promote optimal return to PLOF. Recommend transition to HHPT upon discharge from acute hospitalization.       Follow Up Recommendations Home health PT    Equipment Recommendations  Rolling walker with 5" wheels    Recommendations for Other Services       Precautions / Restrictions Precautions Precautions: Fall Restrictions Weight Bearing Restrictions: No      Mobility  Bed Mobility Overal bed mobility: Independent             General bed mobility comments: safe technique with bed mobility with ease of effort noted  Transfers Overall transfer level: Needs assistance Equipment used: Rolling walker (2 wheeled) Transfers: Sit to/from Stand Sit to Stand: Min guard         General transfer comment: safe technique with cues for pushing from seated surface. Once standing, upright posture noted  Ambulation/Gait Ambulation/Gait assistance: Min guard Ambulation Distance  (Feet): 120 Feet Assistive device: Rolling walker (2 wheeled) Gait Pattern/deviations: Step-through pattern     General Gait Details: slow cautious reciprocal gait pattern with safe technique. No fatigue noted, however chair follow used for safety. Tends to veer to the L, cues to keep RW straight.  Stairs            Wheelchair Mobility    Modified Rankin (Stroke Patients Only)       Balance Overall balance assessment: History of Falls;Needs assistance Sitting-balance support: Feet supported Sitting balance-Leahy Scale: Good     Standing balance support: Bilateral upper extremity supported Standing balance-Leahy Scale: Fair                               Pertinent Vitals/Pain Pain Assessment: No/denies pain    Home Living Family/patient expects to be discharged to:: Private residence Living Arrangements: Children(daughter, SIL and grandkids) Available Help at Discharge: Available 24 hours/day;Family Type of Home: House Home Access: Stairs to enter Entrance Stairs-Rails: Can reach both Entrance Stairs-Number of Steps: 4 Home Layout: One level Home Equipment: Walker - 4 wheels;Shower seat(lift chair)      Prior Function Level of Independence: Needs assistance      ADL's / Homemaking Assistance Needed: Daughter assist pt. with bathing. Daughter and grandaughter assist pt. with dressing. Pt. faimly assist pt. with meal preparation, and managing medicine.   Comments: reports she has assistance for ADLs by family and there is somebody there all the time. Uses rollater for mobility on "bad" days. Reports multiple falls recently     Hand Dominance   Dominant  Hand: Right    Extremity/Trunk Assessment   Upper Extremity Assessment Upper Extremity Assessment: (B UE grossly 4/5)    Lower Extremity Assessment Lower Extremity Assessment: Generalized weakness(B LE grossly 4/5)       Communication   Communication: Expressive difficulties  Cognition  Arousal/Alertness: Awake/alert Behavior During Therapy: WFL for tasks assessed/performed Overall Cognitive Status: Within Functional Limits for tasks assessed(delayed processing)                                        General Comments      Exercises Other Exercises Other Exercises: Seated ther-ex performed including alt. marching, LAQ, hip add squeezes and 5 time STS in 30 seconds. All ther-ex performed x 10 reps with supervision and cues for performance.   Assessment/Plan    PT Assessment Patient needs continued PT services  PT Problem List Decreased strength;Decreased balance;Decreased mobility       PT Treatment Interventions DME instruction;Gait training;Therapeutic exercise;Balance training    PT Goals (Current goals can be found in the Care Plan section)  Acute Rehab PT Goals Patient Stated Goal: To retun home PT Goal Formulation: With patient Time For Goal Achievement: 07/23/17 Potential to Achieve Goals: Good    Frequency 7X/week   Barriers to discharge        Co-evaluation               AM-PAC PT "6 Clicks" Daily Activity  Outcome Measure Difficulty turning over in bed (including adjusting bedclothes, sheets and blankets)?: None Difficulty moving from lying on back to sitting on the side of the bed? : None Difficulty sitting down on and standing up from a chair with arms (e.g., wheelchair, bedside commode, etc,.)?: Unable Help needed moving to and from a bed to chair (including a wheelchair)?: A Little Help needed walking in hospital room?: A Little Help needed climbing 3-5 steps with a railing? : A Little 6 Click Score: 18    End of Session Equipment Utilized During Treatment: Gait belt Activity Tolerance: Patient tolerated treatment well Patient left: in chair;with chair alarm set Nurse Communication: Mobility status PT Visit Diagnosis: Difficulty in walking, not elsewhere classified (R26.2);Unsteadiness on feet (R26.81)    Time:  1610-9604 PT Time Calculation (min) (ACUTE ONLY): 38 min   Charges:   PT Evaluation $PT Eval Low Complexity: 1 Low PT Treatments $Therapeutic Exercise: 23-37 mins   PT G Codes:        Elizabeth Palau, PT, DPT [phone]   Yaslyn Cumby 07/09/2017, 2:43 PM

## 2017-07-10 LAB — GLUCOSE, CAPILLARY
GLUCOSE-CAPILLARY: 77 mg/dL (ref 65–99)
GLUCOSE-CAPILLARY: 88 mg/dL (ref 65–99)

## 2017-07-10 MED ORDER — LISINOPRIL 20 MG PO TABS: 40.0000 mg | ORAL_TABLET | Freq: Every day | ORAL | Status: DC

## 2017-07-10 NOTE — Discharge Summary (Signed)
[facility] - [place] [facility]   PATIENT NAME: Kelly Romero    MR#:  [id number]  DATE OF BIRTH:  [birth year]  DATE OF ADMISSION:  07/08/2017 ADMITTING PHYSICIAN: Auburn Bilberry, MD  DATE OF DISCHARGE: 07/10/2017  PRIMARY CARE PHYSICIAN: Noni Saupe, MD    ADMISSION DIAGNOSIS:  Facial droop [R29.810] Dysarthria [R47.1]  DISCHARGE DIAGNOSIS:  Active Problems:   CVA (cerebral vascular accident) (HCC)   SECONDARY DIAGNOSIS:   Past Medical History:  Diagnosis Date  . Diabetes mellitus without complication (HCC)   . Headache   . Hyperlipidemia   . Hypertension   . Stroke (HCC)   . TIA (transient ischemic attack)     HOSPITAL COURSE:   52 year old female with a history of TIA, diabetes and hypertension who presented with slurred speech and facial droop.  1.  Acute small infarcts in the left hemisphere: Patient evaluated by physical therapy as well as neurology.  MRI did show acute infarcts.  Carotid Doppler showed no evidence of hemodynamically significant stenosis. Echocardiogram did not show evidence of PFO (echocardiogram was not completely completed due to the patient having panic attack) LDL was 72 and A1c 7.3 patient has had extensive work-up in the past.  Neurology did not recommend repeating at this time.  Recommendations are to continue Plavix and aspirin along with statin. Patient will follow-up with her neurologist and stroke specialist.  2.  Diabetes: Due to poor p.o. intake we have discontinued oral diabetic medication however she will continue with Lantus.  She was advised to check her blood sugars q. before meals and nightly.  3.  Essential hypertension: Patient continue on lisinopril with close outpatient follow-up.  4.  Hyperlipidemia: Patient will continue statin.  5.  Depression: Patient deferred consultation with psychiatry. Perhaps her PCP can guide her.  DISCHARGE CONDITIONS AND DIET:   Able for discharge on ADA/heart  healthy diet  CONSULTS OBTAINED:  Treatment Team:  Kym Groom, MD Thana Farr, MD Cindee Lame, MD  DRUG ALLERGIES:   Allergies  Allergen Reactions  . Erythromycin Itching  . Latex     itch    DISCHARGE MEDICATIONS:   Allergies as of 07/10/2017      Reactions   Erythromycin Itching   Latex    itch      Medication List    STOP taking these medications   Evolocumab 140 MG/ML Soaj Commonly known as:  REPATHA SURECLICK   glimepiride 2 MG tablet Commonly known as:  AMARYL     TAKE these medications   aspirin EC 81 MG tablet Take 1 tablet by mouth every morning.   B2 100 MG Tabs Take 200 mg by mouth every morning.   clopidogrel 75 MG tablet Commonly known as:  PLAVIX Take 1 tablet (75 mg total) by mouth daily.   divalproex 500 MG DR tablet Commonly known as:  DEPAKOTE Take 1 tablet (500 mg total) by mouth 2 (two) times daily.   insulin detemir 100 UNIT/ML injection Commonly known as:  LEVEMIR Inject 85 Units into the skin every morning.   lisinopril 40 MG tablet Commonly known as:  PRINIVIL,ZESTRIL Take 1 tablet (40 mg total) by mouth daily.   Magnesium 500 MG Tabs Take 1 tablet by mouth every evening.   rosuvastatin 40 MG tablet Commonly known as:  CRESTOR Take 1 tablet (40 mg total) by mouth at bedtime.            Durable Medical Equipment  (From admission, onward)  Start     Ordered   07/10/17 1024  For home use only DME Walker rolling  Once    Question:  Patient needs a walker to treat with the following condition  Answer:  CVA (cerebral vascular accident) (HCC)   07/10/17 1023        Today   CHIEF COMPLAINT:  No acute events overnight.  Patient continues to have some speech difficulties and facial droop.   VITAL SIGNS:  Blood pressure (!) 158/59, pulse 93, temperature 98.7 F (37.1 C), temperature source Oral, resp. rate 16, height  (1.651 m), weight 96.6 kg (212 lb 14.4 oz), SpO2 100 %.   REVIEW  OF SYSTEMS:  Review of Systems  Constitutional: Negative.  Negative for chills, fever and malaise/fatigue.  HENT: Negative.  Negative for ear discharge, ear pain, hearing loss, nosebleeds and sore throat.   Eyes: Negative.  Negative for blurred vision and pain.  Respiratory: Negative.  Negative for cough, hemoptysis, shortness of breath and wheezing.   Cardiovascular: Negative.  Negative for chest pain, palpitations and leg swelling.  Gastrointestinal: Negative.  Negative for abdominal pain, blood in stool, diarrhea, nausea and vomiting.  Genitourinary: Negative.  Negative for dysuria.  Musculoskeletal: Negative.  Negative for back pain.  Skin: Negative.   Neurological: Positive for speech change and focal weakness. Negative for dizziness, tremors, seizures and headaches.  Endo/Heme/Allergies: Negative.  Does not bruise/bleed easily.  Psychiatric/Behavioral: Positive for depression. Negative for hallucinations and suicidal ideas.     PHYSICAL EXAMINATION:  GENERAL:  52 y.o.-year-old patient lying in the bed with no acute distress.  NECK:  Supple, no jugular venous distention. No thyroid enlargement, no tenderness.  LUNGS: Normal breath sounds bilaterally, no wheezing, rales,rhonchi  No use of accessory muscles of respiration.  CARDIOVASCULAR: S1, S2 normal. No murmurs, rubs, or gallops.  ABDOMEN: Soft, non-tender, non-distended. Bowel sounds present. No organomegaly or mass.  EXTREMITIES: No pedal edema, cyanosis, or clubbing.  PSYCHIATRIC: The patient is alert and oriented x 3.  SKIN: No obvious rash, lesion, or ulcer.  Neuro: Right facial droop with right upper extremity weakness 4 out of 5 and mild a aphasia. DATA REVIEW:   CBC Recent Labs  Lab 07/08/17 1224  WBC 5.3  HGB 10.2*  HCT 31.0*  PLT 276    Chemistries  Recent Labs  Lab 07/08/17 1224  NA 139  K 4.2  CL 106  CO2 28  GLUCOSE 175*  BUN 24*  CREATININE 1.23*  CALCIUM 9.1  AST 20  ALT 12*  ALKPHOS 77   BILITOT 0.5    Cardiac Enzymes Recent Labs  Lab 07/08/17 1224  TROPONINI <0.03    Microbiology Results  @  RADIOLOGY:  Ct Head Wo Contrast  Result Date: 07/08/2017 CLINICAL DATA:  Weakness and slurred speech for 1 day EXAM: CT HEAD WITHOUT CONTRAST TECHNIQUE: Contiguous axial images were obtained from the base of the skull through the vertex without intravenous contrast. COMPARISON:  04/27/2017 FINDINGS: Brain: Old left PCA infarct is identified. Scattered lacunar infarcts are again identified and stable. No findings to suggest acute hemorrhage, acute infarction or space-occupying lesion are noted. Vascular: No hyperdense vessel or unexpected calcification. Skull: Normal. Negative for fracture or focal lesion. Sinuses/Orbits: No acute finding. Other: None. IMPRESSION: Chronic ischemic changes are noted.  No acute abnormality noted. Electronically Signed   By: Alcide Clever M.D.   On: 07/08/2017 12:37   Mr Brain Wo Contrast  Result Date: 07/09/2017 CLINICAL DATA:  Stroke aphasia.  Right-sided weakness EXAM: MRI HEAD WITHOUT CONTRAST MRA HEAD WITHOUT CONTRAST TECHNIQUE: Multiplanar, multiecho pulse sequences of the brain and surrounding structures were obtained without intravenous contrast. Angiographic images of the head were obtained using MRA technique without contrast. COMPARISON:  CT head one thousand nineteen FINDINGS: MRI HEAD FINDINGS Brain: Small areas of acute infarction in the left corona radiata and left frontal white matter. Chronic infarct left occipital parietal lobe. Chronic microvascular ischemic change in the white matter bilaterally. Negative for hemorrhage mass or edema. Vascular: Normal arterial flow voids Skull and upper cervical spine: Negative Sinuses/Orbits: Mucosal edema right maxillary sinus. Bilateral cataract removal Other: None MRA HEAD FINDINGS Image quality degraded by motion Moderately severe stenosis distal vertebral artery bilaterally. Mild to  moderate stenosis proximal basilar. Cerebellar arteries not well seen due to motion. Severe stenosis proximal right posterior cerebral artery. Occlusion mid left posterior cerebral artery. Fetal origin left posterior cerebral artery. Atherosclerotic irregularity and mild moderate stenosis in the cavernous carotid bilaterally. Diffuse atherosclerotic disease and middle cerebral arteries bilaterally. Both anterior cerebral arteries are patent. IMPRESSION: Small areas of acute infarct in the deep white matter on the left Moderate chronic ischemic changes in the white matter and left occipital parietal lobe. MRA degraded by motion. Diffuse intracranial atherosclerotic disease. Electronically Signed   By: Marlan Palau M.D.   On: 07/09/2017 12:31   US Carotid Bilateral (at [facility] Only)  Result Date: 07/09/2017 CLINICAL DATA:  52 year old female with a history of right-sided cerebrovascular accident EXAM: BILATERAL CAROTID DUPLEX ULTRASOUND TECHNIQUE: Wallace Cullens scale imaging, color Doppler and duplex ultrasound were performed of bilateral carotid and vertebral arteries in the neck. COMPARISON:  Prior duplex carotid ultrasound 10/02/2016 FINDINGS: Criteria: Quantification of carotid stenosis is based on velocity parameters that correlate the residual internal carotid diameter with NASCET-based stenosis levels, using the diameter of the distal internal carotid lumen as the denominator for stenosis measurement. The following velocity measurements were obtained: RIGHT ICA: 115/30 cm/sec CCA: 86/15 cm/sec SYSTOLIC ICA/CCA RATIO:  1.0 ECA:  93 cm/sec LEFT ICA: 98/30 cm/sec CCA: 86/16 cm/sec SYSTOLIC ICA/CCA RATIO:  1.1 ECA:  122 cm/sec RIGHT CAROTID ARTERY: Moderate heterogeneous and slightly irregular atherosclerotic plaque in the proximal internal carotid artery. By peak systolic velocity criteria, the estimated stenosis remains less than 50%. RIGHT VERTEBRAL ARTERY:  Patent with normal antegrade flow. LEFT CAROTID  ARTERY: Trace heterogeneous and relatively smooth atherosclerotic plaque in the proximal internal carotid artery. By peak systolic velocity criteria, the estimated stenosis remains less than 50%. LEFT VERTEBRAL ARTERY:  Patent with normal antegrade flow. IMPRESSION: 1. Mild (1-49%) stenosis proximal right internal carotid artery secondary to moderate heterogeneous and slightly irregular atherosclerotic plaque. 2. Mild (1-49%) stenosis proximal left internal carotid artery secondary to minimal heterogeneous but smooth atherosclerotic plaque. 3. Vertebral arteries are patent with normal antegrade flow. Signed, Sterling Big, MD [facility] Electronically Signed   By: Malachy Moan M.D.   On: 07/09/2017 09:05   Mr Maxine Glenn Head/brain XL Cm  Result Date: 07/09/2017 CLINICAL DATA:  Stroke aphasia.  Right-sided weakness EXAM: MRI HEAD WITHOUT CONTRAST MRA HEAD WITHOUT CONTRAST TECHNIQUE: Multiplanar, multiecho pulse sequences of the brain and surrounding structures were obtained without intravenous contrast. Angiographic images of the head were obtained using MRA technique without contrast. COMPARISON:  CT head one thousand nineteen FINDINGS: MRI HEAD FINDINGS Brain: Small areas of acute infarction in the left corona radiata and left frontal white matter. Chronic infarct left occipital parietal lobe.  Chronic microvascular ischemic change in the white matter bilaterally. Negative for hemorrhage mass or edema. Vascular: Normal arterial flow voids Skull and upper cervical spine: Negative Sinuses/Orbits: Mucosal edema right maxillary sinus. Bilateral cataract removal Other: None MRA HEAD FINDINGS Image quality degraded by motion Moderately severe stenosis distal vertebral artery bilaterally. Mild to moderate stenosis proximal basilar. Cerebellar arteries not well seen due to motion. Severe stenosis proximal right posterior cerebral artery. Occlusion mid left  posterior cerebral artery. Fetal origin left posterior cerebral artery. Atherosclerotic irregularity and mild moderate stenosis in the cavernous carotid bilaterally. Diffuse atherosclerotic disease and middle cerebral arteries bilaterally. Both anterior cerebral arteries are patent. IMPRESSION: Small areas of acute infarct in the deep white matter on the left Moderate chronic ischemic changes in the white matter and left occipital parietal lobe. MRA degraded by motion. Diffuse intracranial atherosclerotic disease. Electronically Signed   By: Marlan Palau M.D.   On: 07/09/2017 12:31      Allergies as of 07/10/2017      Reactions   Erythromycin Itching   Latex    itch      Medication List    STOP taking these medications   Evolocumab 140 MG/ML Soaj Commonly known as:  REPATHA SURECLICK   glimepiride 2 MG tablet Commonly known as:  AMARYL     TAKE these medications   aspirin EC 81 MG tablet Take 1 tablet by mouth every morning.   B2 100 MG Tabs Take 200 mg by mouth every morning.   clopidogrel 75 MG tablet Commonly known as:  PLAVIX Take 1 tablet (75 mg total) by mouth daily.   divalproex 500 MG DR tablet Commonly known as:  DEPAKOTE Take 1 tablet (500 mg total) by mouth 2 (two) times daily.   insulin detemir 100 UNIT/ML injection Commonly known as:  LEVEMIR Inject 85 Units into the skin every morning.   lisinopril 40 MG tablet Commonly known as:  PRINIVIL,ZESTRIL Take 1 tablet (40 mg total) by mouth daily.   Magnesium 500 MG Tabs Take 1 tablet by mouth every evening.   rosuvastatin 40 MG tablet Commonly known as:  CRESTOR Take 1 tablet (40 mg total) by mouth at bedtime.            Durable Medical Equipment  (From admission, onward)        Start     Ordered   07/10/17 1024  For home use only DME Walker rolling  Once    Question:  Patient needs a walker to treat with the following condition  Answer:  CVA (cerebral vascular accident) (HCC)   07/10/17 1023         Management plans discussed with the patient and family and they are in agreement. Stable for discharge home with [facility]  Patient should follow up with pcp  CODE STATUS:     Code Status Orders  (From admission, onward)        Start     Ordered   07/08/17 1648  Full code  Continuous     07/08/17 1647    Code Status History    Date Active Date Inactive Code Status Order ID Comments User Context   12/26/2015 0834 01/01/2016 1844 Full Code [id number]  Denton Brick, MD ED   08/15/2015 2254 08/19/2015 2030 Full Code [id number]  [name], MD Inpatient    Advance Directive Documentation     Most Recent Value  Type of Advance Directive  Healthcare Power of Attorney  Pre-existing out of  facility DNR order (yellow form or pink MOST form)  -  "MOST" Form in Place?  -      TOTAL TIME TAKING CARE OF THIS PATIENT: 39 minutes.    Note: This dictation was prepared with Dragon dictation along with smaller phrase technology. Any transcriptional errors that result from this process are unintentional.  Jaheem Hedgepath M.D on 07/10/2017 at 11:57 AM  Between 7am to 6pm - Pager - [phone] After 6pm go to [email or web address] - password [email or web address]  [facility]  Office  [phone]  CC: Primary care physician; Noni Saupe, MD

## 2017-07-10 NOTE — Plan of Care (Signed)
  Problem: Education: Goal: Knowledge of General Education information will improve Outcome: Progressing   Problem: Health Behavior/Discharge Planning: Goal: Ability to manage health-related needs will improve Outcome: Progressing   Problem: Clinical Measurements: Goal: Ability to maintain clinical measurements within normal limits will improve Outcome: Progressing Goal: Will remain free from infection Outcome: Progressing Goal: Diagnostic test results will improve Outcome: Progressing Goal: Respiratory complications will improve Outcome: Progressing Goal: Cardiovascular complication will be avoided Outcome: Progressing   Problem: Activity: Goal: Risk for activity intolerance will decrease Outcome: Progressing   Problem: Nutrition: Goal: Adequate nutrition will be maintained Outcome: Progressing   Problem: Coping: Goal: Level of anxiety will decrease Outcome: Progressing   Problem: Elimination: Goal: Will not experience complications related to bowel motility Outcome: Progressing Goal: Will not experience complications related to urinary retention Outcome: Progressing   Problem: Pain Managment: Goal: General experience of comfort will improve Outcome: Progressing   Problem: Safety: Goal: Ability to remain free from injury will improve Outcome: Progressing   Problem: Skin Integrity: Goal: Risk for impaired skin integrity will decrease Outcome: Progressing   Problem: Education: Goal: Knowledge of disease or condition will improve Outcome: Progressing Goal: Knowledge of secondary prevention will improve Outcome: Progressing Goal: Knowledge of patient specific risk factors addressed and post discharge goals established will improve Outcome: Progressing   Problem: Self-Care: Goal: Ability to participate in self-care as condition permits will improve Outcome: Progressing   Problem: Nutrition: Goal: Risk of aspiration will decrease Outcome: Progressing

## 2017-07-10 NOTE — Evaluation (Signed)
Speech Language Pathology Evaluation Patient Details Name: Kelly Romero MRN: [id number] DOB: 06/24/1965 Today's Date: 07/10/2017 Time: 9563-8756 SLP Time Calculation (min) (ACUTE ONLY): 31 min  Problem List:  Patient Active Problem List   Diagnosis Date Noted  . CVA (cerebral vascular accident) (HCC) 07/08/2017  . Chest pain 11/06/2016  . 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 Aphasia as late effect of stroke 04/10/2016  . Encephalopathy   . Cerebral thrombosis with cerebral infarction 12/29/2015  . Hyperglycemia   . Hyperlipidemia   . Hypertensive emergency   . Deep venous thrombosis (HCC)   . AKI (acute kidney injury) (HCC) 12/26/2015  . Altered mental status   . Stroke-like symptoms   . Acute encephalopathy 08/17/2015  . Anxiety and depression   . CVA (cerebrovascular accident due to intracerebral hemorrhage) (HCC) 08/15/2015  . Stroke (HCC)   . Encephalopathy, metabolic 12/28/2014  . 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 Essential hypertension 12/28/2014  . New onset headache 11/02/2014   Past Medical History:  Past Medical History:  Diagnosis Date  . Diabetes mellitus without complication (HCC)   . Headache   . Hyperlipidemia   . Hypertension   . Stroke (HCC)   . TIA (transient ischemic attack)    Past Surgical History:  Past Surgical History:  Procedure Laterality Date  . APPENDECTOMY    . CHOLECYSTECTOMY    . FOOT GANGLION EXCISION    . HERNIA REPAIR    . IR GENERIC HISTORICAL  12/30/2015   IR ANGIO INTRA EXTRACRAN SEL COM CAROTID INNOMINATE BILAT MOD SED 12/30/2015 Julieanne Cotton, MD [facility]INTER[facility] RAD  . IR GENERIC HISTORICAL  12/30/2015   IR ANGIO VERTEBRAL SEL VERTEBRAL BILAT MOD SED 12/30/2015 Julieanne Cotton, MD [facility]  . TEE WITHOUT CARDIOVERSION N/A 04/19/2015   Procedure: TRANSESOPHAGEAL ECHOCARDIOGRAM (TEE);  Surgeon: Jake Bathe, MD;  Location: [facility]  ENDOSCOPY;  Service: Cardiovascular;  Laterality: N/A;  . VAGINAL HYSTERECTOMY     HPI:      Assessment / Plan / Recommendation Clinical Impression  pt presents with a mild dysarthria affecting her right side oral cavity. pt noted that it is [name] at baseline and daughter present agreed. pt able to smile with right side continued impairment. SLP assisted pt in OME's and left  instructions  for OME's to complete independently. pt had no questions and was able to follow instructions and commands with limited to no need for further cuing. SLP will follow up to ensure adequate completion of OME's and ensure resolving of difficulties. pt able to answer questions and oriented x 4. pt voice is weak and breathy. SLP will monitor.     SLP Assessment  SLP Recommendation/Assessment: Patient needs continued Speech Lanaguage Pathology Services SLP Visit Diagnosis: Cognitive communication deficit (R41.841)    Follow Up Recommendations       Frequency and Duration min 3x week  2 weeks      SLP Evaluation Cognition  Overall Cognitive Status: Within Functional Limits for tasks assessed Arousal/Alertness: Awake/alert Orientation Level: Oriented X4 Attention: Focused Focused Attention: Appears intact Memory: Appears intact Awareness: Appears intact Problem Solving: Appears intact Executive Function: Reasoning;Sequencing;Self Monitoring Reasoning: Appears intact Sequencing: Appears intact Self Monitoring: Appears intact Safety/Judgment: Appears intact       Comprehension  Auditory Comprehension Overall Auditory Comprehension: Appears within functional limits for tasks assessed Yes/No Questions: Within Functional Limits Commands: Within Functional Limits Conversation: Complex Visual Recognition/Discrimination Discrimination: Within Function Limits Reading Comprehension  Reading Status: Within funtional limits    Expression Expression Primary Mode of Expression: Verbal Verbal  Expression Overall Verbal Expression: Appears within functional limits for tasks assessed Initiation: No impairment Automatic Speech: Name;Social Response;Counting;Day of week;Month of year Level of Generative/Spontaneous Verbalization: Conversation Repetition: No impairment Naming: No impairment Pragmatics: No impairment   Oral / Motor  Oral Motor/Sensory Function Overall Oral Motor/Sensory Function: Mild impairment Facial ROM: Reduced right Facial Symmetry: Abnormal symmetry right Facial Strength: Reduced right Lingual ROM: Reduced right Lingual Strength: Reduced Motor Speech Overall Motor Speech: Impaired Respiration: Within functional limits Phonation: Breathy Articulation: Impaired Level of Impairment: Sentence Intelligibility: Intelligible   GO                    Meredith Pel Sauber 07/10/2017, 9:50 AM

## 2017-07-10 NOTE — Progress Notes (Signed)
Patient discharged with daughter, education provided, verbalized understanding of discharge instructions, patient and daughter with no complaints.

## 2017-07-10 NOTE — Progress Notes (Signed)
   07/10/17 1235  Clinical Encounter Type  Visited With Patient not available;Health care provider  Visit Type Follow-up   Chaplain attempted to follow up on order; room empty.  Per staff at desk, patient has discharged.

## 2017-07-10 NOTE — Progress Notes (Signed)
Psychiatry went to see patient (called around 10am this morning) but patient had been discharged.

## 2017-07-10 NOTE — Care Management Note (Signed)
Case Management Note  Patient Details  Name: Kelly Romero MRN: [id number] Date of Birth: 1966-01-14  Subjective/Objective:  Met with patient at bedside. She lives at home with her daughter, son in law and grand children. She has a walker , bedside commode and shower seat. Discussed home health services. She is agreeable. Offered home health agencies. Referral to [facility] for RN, PT, OT, SLP, HHA and SW. Request a visit within 24 hours of discharge. She will discharge home by car. PCP is Dr. Lin Landsman.                    Action/Plan:   Expected Discharge Date:  07/10/17               Expected Discharge Plan:  [place]  In-House Referral:     Discharge planning Services  CM Consult  Post Acute Care Choice:  Home Health Choice offered to:  Patient  DME Arranged:    DME Agency:     HH Arranged:  RN, OT, Nurse's Aide, Speech Therapy, Social Work [employer] Agency:  [place]  Status of Service:  Completed, signed off  If discussed at [employer], dates discussed:    Additional Comments:  Jolly Mango, RN 07/10/2017, 10:30 AM

## 2017-07-11 ENCOUNTER — Emergency Department (HOSPITAL_COMMUNITY): Payer: Medicaid Other

## 2017-07-11 ENCOUNTER — Inpatient Hospital Stay (HOSPITAL_COMMUNITY)
Admission: EM | Admit: 2017-07-11 | Discharge: 2017-07-13 | DRG: 041 | Disposition: A | Discharge status: Rehabilitation Facility | Payer: Medicaid Other | Attending: Internal Medicine | Admitting: Internal Medicine

## 2017-07-11 ENCOUNTER — Encounter (HOSPITAL_COMMUNITY): Payer: Self-pay

## 2017-07-11 DIAGNOSIS — Z9114 Patient's other noncompliance with medication regimen: Secondary | ICD-10-CM

## 2017-07-11 DIAGNOSIS — I69351 Hemiplegia and hemiparesis following cerebral infarction affecting right dominant side: Secondary | ICD-10-CM | POA: Diagnosis not present

## 2017-07-11 DIAGNOSIS — I69322 Dysarthria following cerebral infarction: Secondary | ICD-10-CM | POA: Diagnosis not present

## 2017-07-11 DIAGNOSIS — E1142 Type 2 diabetes mellitus with diabetic polyneuropathy: Secondary | ICD-10-CM | POA: Diagnosis not present

## 2017-07-11 DIAGNOSIS — E1165 Type 2 diabetes mellitus with hyperglycemia: Secondary | ICD-10-CM | POA: Diagnosis not present

## 2017-07-11 DIAGNOSIS — I639 Cerebral infarction, unspecified: Principal | ICD-10-CM | POA: Diagnosis present

## 2017-07-11 DIAGNOSIS — G40909 Epilepsy, unspecified, not intractable, without status epilepticus: Secondary | ICD-10-CM | POA: Diagnosis not present

## 2017-07-11 DIAGNOSIS — Z7982 Long term (current) use of aspirin: Secondary | ICD-10-CM

## 2017-07-11 DIAGNOSIS — R1310 Dysphagia, unspecified: Secondary | ICD-10-CM | POA: Diagnosis present

## 2017-07-11 DIAGNOSIS — E1169 Type 2 diabetes mellitus with other specified complication: Secondary | ICD-10-CM | POA: Diagnosis not present

## 2017-07-11 DIAGNOSIS — I4891 Unspecified atrial fibrillation: Secondary | ICD-10-CM | POA: Diagnosis present

## 2017-07-11 DIAGNOSIS — I69320 Aphasia following cerebral infarction: Secondary | ICD-10-CM | POA: Diagnosis not present

## 2017-07-11 DIAGNOSIS — Z7902 Long term (current) use of antithrombotics/antiplatelets: Secondary | ICD-10-CM | POA: Diagnosis not present

## 2017-07-11 DIAGNOSIS — R4701 Aphasia: Secondary | ICD-10-CM | POA: Diagnosis present

## 2017-07-11 DIAGNOSIS — I69398 Other sequelae of cerebral infarction: Secondary | ICD-10-CM | POA: Diagnosis not present

## 2017-07-11 DIAGNOSIS — Z881 Allergy status to other antibiotic agents status: Secondary | ICD-10-CM | POA: Diagnosis not present

## 2017-07-11 DIAGNOSIS — I6930 Unspecified sequelae of cerebral infarction: Secondary | ICD-10-CM

## 2017-07-11 DIAGNOSIS — R29707 NIHSS score 7: Secondary | ICD-10-CM | POA: Diagnosis present

## 2017-07-11 DIAGNOSIS — Z8673 Personal history of transient ischemic attack (TIA), and cerebral infarction without residual deficits: Secondary | ICD-10-CM

## 2017-07-11 DIAGNOSIS — R1312 Dysphagia, oropharyngeal phase: Secondary | ICD-10-CM | POA: Diagnosis not present

## 2017-07-11 DIAGNOSIS — I69391 Dysphagia following cerebral infarction: Secondary | ICD-10-CM

## 2017-07-11 DIAGNOSIS — I672 Cerebral atherosclerosis: Secondary | ICD-10-CM | POA: Diagnosis present

## 2017-07-11 DIAGNOSIS — R55 Syncope and collapse: Secondary | ICD-10-CM | POA: Diagnosis not present

## 2017-07-11 DIAGNOSIS — I69315 Cognitive social or emotional deficit following cerebral infarction: Secondary | ICD-10-CM | POA: Diagnosis not present

## 2017-07-11 DIAGNOSIS — E669 Obesity, unspecified: Secondary | ICD-10-CM | POA: Diagnosis not present

## 2017-07-11 DIAGNOSIS — R4586 Emotional lability: Secondary | ICD-10-CM | POA: Diagnosis not present

## 2017-07-11 DIAGNOSIS — I10 Essential (primary) hypertension: Secondary | ICD-10-CM

## 2017-07-11 DIAGNOSIS — E46 Unspecified protein-calorie malnutrition: Secondary | ICD-10-CM | POA: Diagnosis not present

## 2017-07-11 DIAGNOSIS — Z794 Long term (current) use of insulin: Secondary | ICD-10-CM

## 2017-07-11 DIAGNOSIS — Z79899 Other long term (current) drug therapy: Secondary | ICD-10-CM | POA: Diagnosis not present

## 2017-07-11 DIAGNOSIS — E785 Hyperlipidemia, unspecified: Secondary | ICD-10-CM | POA: Diagnosis present

## 2017-07-11 DIAGNOSIS — G8191 Hemiplegia, unspecified affecting right dominant side: Secondary | ICD-10-CM | POA: Diagnosis not present

## 2017-07-11 DIAGNOSIS — I6339 Cerebral infarction due to thrombosis of other cerebral artery: Secondary | ICD-10-CM | POA: Diagnosis not present

## 2017-07-11 DIAGNOSIS — R29810 Facial weakness: Secondary | ICD-10-CM | POA: Diagnosis present

## 2017-07-11 DIAGNOSIS — Z87891 Personal history of nicotine dependence: Secondary | ICD-10-CM

## 2017-07-11 DIAGNOSIS — Z91040 Latex allergy status: Secondary | ICD-10-CM

## 2017-07-11 DIAGNOSIS — Z6835 Body mass index (BMI) 35.0-35.9, adult: Secondary | ICD-10-CM

## 2017-07-11 DIAGNOSIS — I951 Orthostatic hypotension: Secondary | ICD-10-CM | POA: Diagnosis not present

## 2017-07-11 DIAGNOSIS — E1140 Type 2 diabetes mellitus with diabetic neuropathy, unspecified: Secondary | ICD-10-CM | POA: Diagnosis not present

## 2017-07-11 DIAGNOSIS — I6389 Other cerebral infarction: Secondary | ICD-10-CM | POA: Diagnosis not present

## 2017-07-11 DIAGNOSIS — R569 Unspecified convulsions: Secondary | ICD-10-CM | POA: Diagnosis not present

## 2017-07-11 DIAGNOSIS — D62 Acute posthemorrhagic anemia: Secondary | ICD-10-CM

## 2017-07-11 DIAGNOSIS — E119 Type 2 diabetes mellitus without complications: Secondary | ICD-10-CM

## 2017-07-11 DIAGNOSIS — I6300 Cerebral infarction due to thrombosis of unspecified precerebral artery: Secondary | ICD-10-CM | POA: Diagnosis not present

## 2017-07-11 LAB — PROTIME-INR
INR: 0.98
PROTHROMBIN TIME: 12.9 s (ref 11.4–15.2)

## 2017-07-11 LAB — COMPREHENSIVE METABOLIC PANEL
ALBUMIN: 2.8 g/dL — AB (ref 3.5–5.0)
ALK PHOS: 66 U/L (ref 38–126)
ALT: 11 U/L — ABNORMAL LOW (ref 14–54)
AST: 16 U/L (ref 15–41)
Anion gap: 7 (ref 5–15)
BILIRUBIN TOTAL: 0.6 mg/dL (ref 0.3–1.2)
BUN: 24 mg/dL — ABNORMAL HIGH (ref 6–20)
CALCIUM: 9.7 mg/dL (ref 8.9–10.3)
CO2: 27 mmol/L (ref 22–32)
Chloride: 108 mmol/L (ref 101–111)
Creatinine, Ser: 1.12 mg/dL — ABNORMAL HIGH (ref 0.44–1.00)
GFR calc non Af Amer: 55 mL/min — ABNORMAL LOW (ref >60)
GLUCOSE: 145 mg/dL — AB (ref 65–99)
POTASSIUM: 4.2 mmol/L (ref 3.5–5.1)
SODIUM: 142 mmol/L (ref 135–145)
TOTAL PROTEIN: 6.7 g/dL (ref 6.5–8.1)

## 2017-07-11 LAB — URINALYSIS, ROUTINE W REFLEX MICROSCOPIC
BACTERIA UA: NONE SEEN
BILIRUBIN URINE: NEGATIVE
Glucose, UA: 50 mg/dL — AB
KETONES UR: NEGATIVE mg/dL
LEUKOCYTES UA: NEGATIVE
NITRITE: NEGATIVE
Specific Gravity, Urine: 1.019 (ref 1.005–1.030)
pH: 6 (ref 5.0–8.0)

## 2017-07-11 LAB — ETHANOL

## 2017-07-11 LAB — DIFFERENTIAL
Basophils Absolute: 0 10*3/uL (ref 0.0–0.1)
Basophils Relative: 0 %
EOS ABS: 0.1 10*3/uL (ref 0.0–0.7)
Eosinophils Relative: 2 %
Lymphocytes Relative: 28 %
Lymphs Abs: 1.4 10*3/uL (ref 0.7–4.0)
MONO ABS: 0.3 10*3/uL (ref 0.1–1.0)
Monocytes Relative: 7 %
NEUTROS PCT: 63 %
Neutro Abs: 3 10*3/uL (ref 1.7–7.7)

## 2017-07-11 LAB — RAPID URINE DRUG SCREEN, HOSP PERFORMED
Amphetamines: NOT DETECTED
Barbiturates: NOT DETECTED
Benzodiazepines: NOT DETECTED
COCAINE: NOT DETECTED
Opiates: NOT DETECTED
TETRAHYDROCANNABINOL: NOT DETECTED

## 2017-07-11 LAB — I-STAT CHEM 8, ED
BUN: 24 mg/dL — ABNORMAL HIGH (ref 6–20)
CALCIUM ION: 1.28 mmol/L (ref 1.15–1.40)
Chloride: 109 mmol/L (ref 101–111)
Creatinine, Ser: 1.1 mg/dL — ABNORMAL HIGH (ref 0.44–1.00)
Glucose, Bld: 143 mg/dL — ABNORMAL HIGH (ref 65–99)
HEMATOCRIT: 27 % — AB (ref 36.0–46.0)
HEMOGLOBIN: 9.2 g/dL — AB (ref 12.0–15.0)
Potassium: 4.2 mmol/L (ref 3.5–5.1)
SODIUM: 143 mmol/L (ref 135–145)
TCO2: 25 mmol/L (ref 22–32)

## 2017-07-11 LAB — CBG MONITORING, ED
GLUCOSE-CAPILLARY: 63 mg/dL — AB (ref 65–99)
Glucose-Capillary: 98 mg/dL (ref 65–99)

## 2017-07-11 LAB — CBC
HCT: 30.1 % — ABNORMAL LOW (ref 36.0–46.0)
Hemoglobin: 9.5 g/dL — ABNORMAL LOW (ref 12.0–15.0)
MCH: 28.4 pg (ref 26.0–34.0)
MCHC: 31.6 g/dL (ref 30.0–36.0)
MCV: 90.1 fL (ref 78.0–100.0)
PLATELETS: 259 10*3/uL (ref 150–400)
RBC: 3.34 MIL/uL — ABNORMAL LOW (ref 3.87–5.11)
RDW: 14 % (ref 11.5–15.5)
WBC: 4.8 10*3/uL (ref 4.0–10.5)

## 2017-07-11 LAB — I-STAT BETA HCG BLOOD, ED (MC, WL, AP ONLY): HCG, QUANTITATIVE: 11.7 m[IU]/mL — AB (ref <5)

## 2017-07-11 LAB — I-STAT TROPONIN, ED: Troponin i, poc: 0.02 ng/mL (ref 0.00–0.08)

## 2017-07-11 LAB — APTT: aPTT: 38 seconds — ABNORMAL HIGH (ref 24–36)

## 2017-07-11 MED ORDER — ASPIRIN 325 MG PO TABS
325.0000 mg | ORAL_TABLET | Freq: Every day | ORAL | Status: DC
  Administered 2017-07-12: 325 mg via ORAL
  Filled 2017-07-11: qty 1

## 2017-07-11 MED ORDER — ACETAMINOPHEN 650 MG RE SUPP: 650.0000 mg | RECTAL | Status: DC | PRN

## 2017-07-11 MED ORDER — ASPIRIN 300 MG RE SUPP: 300.0000 mg | Freq: Every day | RECTAL | Status: DC

## 2017-07-11 MED ORDER — ACETAMINOPHEN 325 MG PO TABS: 650.0000 mg | ORAL_TABLET | ORAL | Status: DC | PRN

## 2017-07-11 MED ORDER — STROKE: EARLY STAGES OF RECOVERY BOOK
Freq: Once | Status: DC
  Filled 2017-07-11: qty 1

## 2017-07-11 MED ORDER — ACETAMINOPHEN 160 MG/5ML PO SOLN: 650.0000 mg | ORAL | Status: DC | PRN

## 2017-07-11 MED ORDER — DEXTROSE 50 % IV SOLN
1.0000 | Freq: Once | INTRAVENOUS | Status: AC
  Administered 2017-07-11: 50 mL via INTRAVENOUS
  Filled 2017-07-11: qty 50

## 2017-07-11 MED ORDER — ENOXAPARIN SODIUM 40 MG/0.4ML ~~LOC~~ SOLN
40.0000 mg | Freq: Every day | SUBCUTANEOUS | Status: DC
  Administered 2017-07-12 – 2017-07-13 (×2): 40 mg via SUBCUTANEOUS
  Filled 2017-07-11 (×3): qty 0.4

## 2017-07-11 MED ORDER — INSULIN ASPART 100 UNIT/ML ~~LOC~~ SOLN
0.0000 [IU] | Freq: Four times a day (QID) | SUBCUTANEOUS | Status: DC
  Administered 2017-07-12: 5 [IU] via SUBCUTANEOUS
  Administered 2017-07-13: 3 [IU] via SUBCUTANEOUS
  Administered 2017-07-13: 2 [IU] via SUBCUTANEOUS

## 2017-07-11 MED ORDER — INSULIN DETEMIR 100 UNIT/ML ~~LOC~~ SOLN
30.0000 [IU] | Freq: Every day | SUBCUTANEOUS | Status: DC
  Filled 2017-07-11: qty 0.3

## 2017-07-11 MED ORDER — LORAZEPAM 2 MG/ML IJ SOLN
1.0000 mg | Freq: Once | INTRAMUSCULAR | Status: AC
  Administered 2017-07-11: 1 mg via INTRAVENOUS
  Filled 2017-07-11: qty 1

## 2017-07-11 NOTE — ED Notes (Signed)
Dr Effie Shy aware of increasing blood pressure readings

## 2017-07-11 NOTE — ED Triage Notes (Signed)
PT daughter reports she was dc from [place] after being dx with CVA. Daughter states once she got home she felt like symptoms were getting worse. This morning when she woke up speech was much more slurred, pt was unable to use right side. Pt daughter reports she had right sided weakness yesterday, but much worse today.

## 2017-07-11 NOTE — ED Provider Notes (Signed)
New strokes seen on MRI.  Denies IV drug use.  Doubt infectious endocarditis.   Discussed with Dr. Otelia Limes, who suggest medicine admit.  Dr. Otelia Limes will consult.   Roxy Horseman, PA-C 07/11/17 2318    Mancel Bale, MD 07/12/17 1420

## 2017-07-11 NOTE — ED Notes (Signed)
Pt given bedside alone

## 2017-07-11 NOTE — ED Notes (Signed)
Pt assisted to the bedside commode

## 2017-07-11 NOTE — Consult Note (Signed)
Referring Physician: Dr. Eulis Foster    Chief Complaint: New acute strokes seen on MRI  HPI: Kelly Romero is an 52 y.o. female presenting with recurrent stroke symptoms since recent discharge from [facility] following a stroke stroke work up after she had presented with slurred speech and facial droop; MRI at that time had shown acute small infarcts in the left hemisphere. Patient's daughter feels that once the patient got home, her symptoms got worse. On awakening this morning her speech was much more slurred and she was unable to use her right side.   Excerpt of Hospital Course from [facility] discharge summary, 07/10/17:  "52 year old female with a history of TIA, diabetes and hypertension who presented with slurred speech and facial droop. 1.  Acute small infarcts in the left hemisphere: Patient evaluated by physical therapy as well as neurology.  MRI did show acute infarcts.  Carotid Doppler showed no evidence of hemodynamically significant stenosis. Echocardiogram did not show evidence of PFO (echocardiogram was not completely completed due to the patient having panic attack). LDL was 72 and A1c 7.3 patient has had extensive work-up in the past.  Neurology did not recommend repeating at this time.  Recommendations are to continue Plavix and aspirin along with statin. Patient will follow-up with her neurologist and stroke specialist."  Work up at [facility] also included a motion degraded MRA head, which revealed diffuse intracranial atherosclerotic disease.  MRI obtained in the ED today showed new strokes in 2 separate vascular territories.  MRI report conclusions: 1. Few small volume new acute ischemic infarcts involving the parasagittal cortical and subcortical/periventricular left frontal lobe, new relative to recent MRI from 07/09/2017. No associated hemorrhage or mass effect. 2. Normal expected interval evolution of recently identified small volume left-sided infarcts, relatively stable in size  and distribution as compared to previous. 3. Otherwise stable appearance of the brain with chronic atrophy and small vessel ischemic disease, with multiple scattered remote infarcts as above.  Neurology was consulted for further recommendations.   Past Medical History:  Diagnosis Date  . Diabetes mellitus without complication ([place])   . Headache   . Hyperlipidemia   . Hypertension   . Stroke ([place])   . TIA (transient ischemic attack)     Past Surgical History:  Procedure Laterality Date  . APPENDECTOMY    . CHOLECYSTECTOMY    . FOOT GANGLION EXCISION    . HERNIA REPAIR    . IR GENERIC HISTORICAL  12/30/2015   IR ANGIO INTRA EXTRACRAN SEL COM CAROTID INNOMINATE BILAT MOD SED 12/30/2015 Luanne Bras, MD [facility]-INTERV RAD  . IR GENERIC HISTORICAL  12/30/2015   IR ANGIO VERTEBRAL SEL VERTEBRAL BILAT MOD SED 12/30/2015 Luanne Bras, MD [facility]INTER[facility]  . TEE WITHOUT CARDIOVERSION N/A 04/19/2015   Procedure: TRANSESOPHAGEAL ECHOCARDIOGRAM (TEE);  Surgeon: Jerline Pain, MD;  Location: [facility] ENDOSCOPY;  Service: Cardiovascular;  Laterality: N/A;  . VAGINAL HYSTERECTOMY      Family History  Problem Relation Age of Onset  . Stroke Father        23s  . Heart attack Father 54  . COPD Mother   . Heart failure Brother   . Cancer Maternal Grandmother        unknown   . COPD Unknown   . Heart failure Maternal Grandfather   . Hypertension Maternal Grandfather   . Cancer Paternal Grandfather        lung  . Diabetes Paternal Grandmother    Social History:  reports that she has never smoked.  She has never used smokeless tobacco. She reports that she does not drink alcohol or use drugs.  Allergies:  Allergies  Allergen Reactions  . Erythromycin Itching  . Latex Itching    Medications:  Current Meds  Medication Sig  . aspirin EC 81 MG tablet Take 1 tablet by mouth every morning.  . clopidogrel (PLAVIX) 75 MG tablet Take 1 tablet (75 mg total) by mouth daily.  . Coenzyme Q10 (CO  Q-10) 200 MG CAPS Take 200 mg by mouth every evening.  Marland Kitchen glimepiride (AMARYL) 2 MG tablet Take 2 mg by mouth daily with breakfast.  . insulin detemir (LEVEMIR) 100 UNIT/ML injection Inject 40 Units into the skin daily before breakfast.   . lisinopril (PRINIVIL,ZESTRIL) 40 MG tablet Take 1 tablet (40 mg total) by mouth daily.  . Magnesium 500 MG TABS Take 500 mg by mouth every evening.   . Riboflavin (B2) 100 MG TABS Take 200 mg by mouth every morning.  . rosuvastatin (CRESTOR) 40 MG tablet Take 1 tablet (40 mg total) by mouth at bedtime.    ROS: As per HPI. Patient somnolent after receiving Ativan for MRI and is not able to reliably give a ROS  Physical Examination: Blood pressure (!) 203/66, pulse 80, temperature 98.8 F (37.1 C), temperature source Oral, resp. rate 14, SpO2 100 %.  HEENT: [place]/AT Lungs: Respirations unlabored Ext: No edema  Neurologic Examination: Mental Status: Drowsy. Slow labored speech with increased latency of verbal responses. Able to follow simple motor commands. Non-cooperative with testing of orientation, naming or repetition.  Cranial Nerves: II:  Right visual field cut on exam limited by patient non-cooperation. PERRL III,IV, VI: Ptosis not present, horizontal EOMI without nystagmus V,VII: Smile symmetric, uncooperative with facial sensory exam VIII: hearing intact to voice IX,X: no hoarseness noted XI: Symmetric shoulder shrug XII: midline tongue extension Motor: Right : Upper extremity   4+/5    Left:     Upper extremity   4+/5  Lower extremity   4+/5    Lower extremity   4+/5 Normal tone throughout; no atrophy noted Sensory: Noncooperative. Yells in pain when testing lower extremity strength  Deep Tendon Reflexes:  Normoactive x 4 Plantars: Right: downgoing  Left: downgoing Cerebellar: Slow FNF without ataxia Gait: Deferred  Results for orders placed or performed during the hospital encounter of 07/11/17 (from the past 48 hour(s))  Ethanol      Status: None   Collection Time: 07/11/17  5:17 PM  Result Value Ref Range   Alcohol, Ethyl (B) <10 <10 mg/dL    Comment:        LOWEST DETECTABLE LIMIT FOR SERUM ALCOHOL IS 10 mg/dL FOR MEDICAL PURPOSES ONLY Performed at [facility], [place] [address] [place], [place] [address]   Protime-INR     Status: None   Collection Time: 07/11/17  5:17 PM  Result Value Ref Range   Prothrombin Time 12.9 11.4 - 15.2 seconds   INR 0.98     Comment: Performed at [facility] [address], [place], [place] [address]  APTT     Status: Abnormal   Collection Time: 07/11/17  5:17 PM  Result Value Ref Range   aPTT 38 (H) 24 - 36 seconds    Comment:        IF BASELINE aPTT IS ELEVATED, SUGGEST PATIENT RISK ASSESSMENT BE USED TO DETERMINE APPROPRIATE ANTICOAGULANT THERAPY. Performed at [facility], [place] [address] [place], [place] [address]   CBC  Status: Abnormal   Collection Time: 07/11/17  5:17 PM  Result Value Ref Range   WBC 4.8 4.0 - 10.5 K/uL   RBC 3.34 (L) 3.87 - 5.11 MIL/uL   Hemoglobin 9.5 (L) 12.0 - 15.0 g/dL   HCT 30.1 (L) 36.0 - 46.0 %   MCV 90.1 78.0 - 100.0 fL   MCH 28.4 26.0 - 34.0 pg   MCHC 31.6 30.0 - 36.0 g/dL   RDW 14.0 11.5 - 15.5 %   Platelets 259 150 - 400 K/uL    Comment: Performed at [facility], [address] [place], [place] [address]  Differential     Status: None   Collection Time: 07/11/17  5:17 PM  Result Value Ref Range   Neutrophils Relative % 63 %   Neutro Abs 3.0 1.7 - 7.7 K/uL   Lymphocytes Relative 28 %   Lymphs Abs 1.4 0.7 - 4.0 K/uL   Monocytes Relative 7 %   Monocytes Absolute 0.3 0.1 - 1.0 K/uL   Eosinophils Relative 2 %   Eosinophils Absolute 0.1 0.0 - 0.7 K/uL   Basophils Relative 0 %   Basophils Absolute 0.0 0.0 - 0.1 K/uL    Comment: Performed at [facility], [address] [place], [place] [address]  Comprehensive metabolic panel     Status: Abnormal   Collection Time: 07/11/17  5:17 PM  Result  Value Ref Range   Sodium 142 135 - 145 mmol/L   Potassium 4.2 3.5 - 5.1 mmol/L   Chloride 108 101 - 111 mmol/L   CO2 27 22 - 32 mmol/L   Glucose, Bld 145 (H) 65 - 99 mg/dL   BUN 24 (H) 6 - 20 mg/dL   Creatinine, Ser 1.12 (H) 0.44 - 1.00 mg/dL   Calcium 9.7 8.9 - 10.3 mg/dL   Total Protein 6.7 6.5 - 8.1 g/dL   Albumin 2.8 (L) 3.5 - 5.0 g/dL   AST 16 15 - 41 U/L   ALT 11 (L) 14 - 54 U/L   Alkaline Phosphatase 66 38 - 126 U/L   Total Bilirubin 0.6 0.3 - 1.2 mg/dL   GFR calc non Af Amer 55 (L) >60 mL/min   GFR calc Af Amer >60 >60 mL/min    Comment: (NOTE) The eGFR has been calculated using the CKD EPI equation. This calculation has not been validated in all clinical situations. eGFR's persistently <60 mL/min signify possible Chronic Kidney Disease.    Anion gap 7 5 - 15    Comment: Performed at [facility], [address] [place], [place] [address]  I-stat troponin, ED     Status: None   Collection Time: 07/11/17  5:25 PM  Result Value Ref Range   Troponin i, poc 0.02 0.00 - 0.08 ng/mL   Comment 3            Comment: Due to the release kinetics of cTnI, a negative result within the first hours of the onset of symptoms does not rule out myocardial infarction with certainty. If myocardial infarction is still suspected, repeat the test at appropriate intervals.   I-Stat beta hCG blood, ED     Status: Abnormal   Collection Time: 07/11/17  5:25 PM  Result Value Ref Range   I-stat hCG, quantitative 11.7 (H) <5 mIU/mL   Comment 3            Comment:   GEST. AGE      CONC.  (mIU/mL)   <=  1 WEEK        5 - 50     2 WEEKS       50 - 500     3 WEEKS       100 - 10,000     4 WEEKS     1,000 - 30,000        FEMALE AND NON-PREGNANT FEMALE:     LESS THAN 5 mIU/mL   I-Stat Chem 8, ED     Status: Abnormal   Collection Time: 07/11/17  5:27 PM  Result Value Ref Range   Sodium 143 135 - 145 mmol/L   Potassium 4.2 3.5 - 5.1 mmol/L   Chloride 109 101 - 111 mmol/L   BUN 24 (H) 6 -  20 mg/dL   Creatinine, Ser 1.10 (H) 0.44 - 1.00 mg/dL   Glucose, Bld 143 (H) 65 - 99 mg/dL   Calcium, Ion 1.28 1.15 - 1.40 mmol/L   TCO2 25 22 - 32 mmol/L   Hemoglobin 9.2 (L) 12.0 - 15.0 g/dL   HCT 27.0 (L) 36.0 - 46.0 %  Urine rapid drug screen (hosp performed)     Status: None   Collection Time: 07/11/17  7:05 PM  Result Value Ref Range   Opiates NONE DETECTED NONE DETECTED   Cocaine NONE DETECTED NONE DETECTED   Benzodiazepines NONE DETECTED NONE DETECTED   Amphetamines NONE DETECTED NONE DETECTED   Tetrahydrocannabinol NONE DETECTED NONE DETECTED   Barbiturates NONE DETECTED NONE DETECTED    Comment: (NOTE) DRUG SCREEN FOR MEDICAL PURPOSES ONLY.  IF CONFIRMATION IS NEEDED FOR ANY PURPOSE, NOTIFY LAB WITHIN 5 DAYS. LOWEST DETECTABLE LIMITS FOR URINE DRUG SCREEN Drug Class                     Cutoff (ng/mL) Amphetamine and metabolites    1000 Barbiturate and metabolites    200 Benzodiazepine                 200 Tricyclics and metabolites     300 Opiates and metabolites        300 Cocaine and metabolites        300 THC                            50 Performed at [facility], [address] [place], [place] [address]   Urinalysis, Routine w reflex microscopic     Status: Abnormal   Collection Time: 07/11/17  7:05 PM  Result Value Ref Range   Color, Urine AMBER (A) YELLOW    Comment: BIOCHEMICALS MAY BE AFFECTED BY COLOR   APPearance CLOUDY (A) CLEAR   Specific Gravity, Urine 1.019 1.005 - 1.030   pH 6.0 5.0 - 8.0   Glucose, UA 50 (A) NEGATIVE mg/dL   Hgb urine dipstick SMALL (A) NEGATIVE   Bilirubin Urine NEGATIVE NEGATIVE   Ketones, ur NEGATIVE NEGATIVE mg/dL   Protein, ur >=300 (A) NEGATIVE mg/dL   Nitrite NEGATIVE NEGATIVE   Leukocytes, UA NEGATIVE NEGATIVE   RBC / HPF 0-5 0 - 5 RBC/hpf   WBC, UA 0-5 0 - 5 WBC/hpf   Bacteria, UA NONE SEEN NONE SEEN   Squamous Epithelial / LPF 0-5 0 - 5    Comment: Please note change in reference range. Performed at  [facility], [address] [place], [place] [address]   CBG monitoring, ED     Status: Abnormal     Collection Time: 07/11/17  7:59 PM  Result Value Ref Range   Glucose-Capillary 63 (L) 65 - 99 mg/dL  CBG monitoring, ED     Status: None   Collection Time: 07/11/17  9:11 PM  Result Value Ref Range   Glucose-Capillary 98 65 - 99 mg/dL   Mr Brain Wo Contrast (neuro Protocol)  Result Date: 07/11/2017 CLINICAL DATA:  Initial evaluation for worsening right-sided weakness with slurred speech, recent stroke. EXAM: MRI HEAD WITHOUT CONTRAST TECHNIQUE: Multiplanar, multiecho pulse sequences of the brain and surrounding structures were obtained without intravenous contrast. COMPARISON:  Prior MRI from 07/09/2017. FINDINGS: Brain: Age-related cerebral atrophy with chronic small vessel ischemic disease again noted. Encephalomalacia with gliosis within the parasagittal left parietal lobe consistent with remote ischemic infarct. Small remote lacunar infarcts present within the bilateral basal ganglia/corona radiata as well as the left thalamus. Chronic hemorrhagic blood products present about several of these infarcts. Continued interval evolution of recently identified small ischemic infarcts involving the left periventricular white matter, relatively stable in size and distribution as compared to previous. No evidence for hemorrhagic transformation or significant mass effect. There are several small areas of new acute infarction involving the cortical gray matter and underlying periventricular white matter of the parasagittal right frontal lobe (series 3, image 34, 32). Largest area of infarct measures 12 mm. No associated hemorrhage or mass effect. No other areas of new or interval infarction. Gray-white matter differentiation otherwise maintained. No mass lesion, midline shift or mass effect. No hydrocephalus. No extra-axial fluid collection. Major dural sinuses are grossly patent. Pituitary gland  suprasellar region normal. Midline structures intact. Vascular: Major intracranial vascular flow voids are maintained at the skull base. Skull and upper cervical spine: Craniocervical junction normal. Upper cervical spine normal. Bone marrow signal intensity within normal limits. Hyperostosis frontalis interna noted. No scalp soft tissue abnormality. Sinuses/Orbits: Globes and orbital soft tissues within normal limits. Patient status post lens extraction bilaterally. Mild scattered mucosal thickening within the ethmoidal air cells and maxillary sinuses. No air-fluid level to suggest acute sinusitis. No mastoid effusion. Inner ear structures normal. Other: None. IMPRESSION: 1. Few small volume new acute ischemic infarcts involving the parasagittal cortical and subcortical/periventricular left frontal lobe, new relative to recent MRI from 07/09/2017. No associated hemorrhage or mass effect. 2. Normal expected interval evolution of recently identified small volume left-sided infarcts, relatively stable in size and distribution as compared to previous. 3. Otherwise stable appearance of the brain with chronic atrophy and small vessel ischemic disease, with multiple scattered remote infarcts as above. Electronically Signed   By: Benjamin  McClintock M.D.   On: 07/11/2017 22:44    Assessment: 51 y.o. female with recurrent acute strokes in separate vascular territories 1. DDx includes vasculitis, hypercoagulable state, cardiac mural thrombus or vegetation not detected on limited prior TTE, paradoxical embolization and intermittent atrial fibrillation 2. Stroke Risk Factors - DM, HLD, HTN, prior TIA/stroke  Plan: 1. Repeat TTE with bubble study under sedation to obtain complete study (prior was incomplete due to panic attack) 2. If TTE is negative, obtain TEE. 3. Cardiac telemetry to assess for possible intermittent atrial fibrillation. 4. Continue ASA, Plavix and rosuvastatin  5. PT consult, OT consult, Speech  consult 6. Risk factor modification 7. Frequent neuro checks 8. Vasculitis and hypercoagulable panels 9. CTA of head and neck to more comprehensively and accurately assess vasculature (had carotid U/S and motion degraded MRA with prior work up).   @Electronically signed: Dr.  @  07/11/2017, 11:04 PM    

## 2017-07-11 NOTE — ED Provider Notes (Signed)
[facility] EMERGENCY DEPARTMENT Provider Note   CSN: [id number] Arrival date & time: 07/11/17  1613     History   Chief Complaint Chief Complaint  Patient presents with  . Cerebrovascular Accident    HPI Kelly Romero is a 52 y.o. female.  The patient presents for evaluation of more trouble talking, sleepiness, and confusion, which was noted by family member at 5 AM this morning.  Patient stated at home afterwards, and was able to eat both breakfast and lunch.  Her daughter brought her here, by private vehicle for evaluation because she was concerned that the patient was getting worse.  The patient was discharged from [facility] yesterday after an admission, whereupon she was diagnosed with acute left brain stroke.  She is being treated with antiplatelet medication, and taking her usual medications.  She has apparently had multiple strokes and has been referred to a specialist by her primary neurologist.  There is been no recent fever, chills, nausea, vomiting, cough, chest pain, abdominal or back pain.  There are no other known modifying factors.  Per chart review, the patient has had numerous strokes, 2 in the last month, and her primary neurologist, Dr. Everlena Cooper has referred her to a stroke specialist, Dr. Pearlean Brownie.  Dr. Everlena Cooper also wants her to follow-up with a cardiologist for more in-depth evaluation for possible cardiac source.   HPI  Past Medical History:  Diagnosis Date  . Diabetes mellitus without complication (HCC)   . Headache   . Hyperlipidemia   . Hypertension   . Stroke (HCC)   . TIA (transient ischemic attack)     Patient Active Problem List   Diagnosis Date Noted  . CVA (cerebral vascular accident) (HCC) 07/08/2017  . Chest pain 11/06/2016  . 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 Aphasia as late effect of stroke 04/10/2016  . Encephalopathy   . Cerebral thrombosis with cerebral infarction  12/29/2015  . Hyperglycemia   . Hyperlipidemia   . Hypertensive emergency   . Deep venous thrombosis (HCC)   . AKI (acute kidney injury) (HCC) 12/26/2015  . Altered mental status   . Stroke-like symptoms   . Acute encephalopathy 08/17/2015  . Anxiety and depression   . CVA (cerebrovascular accident due to intracerebral hemorrhage) (HCC) 08/15/2015  . Stroke (HCC)   . Encephalopathy, metabolic 12/28/2014  . 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 Essential hypertension 12/28/2014  . New onset headache 11/02/2014    Past Surgical History:  Procedure Laterality Date  . APPENDECTOMY    . CHOLECYSTECTOMY    . FOOT GANGLION EXCISION    . HERNIA REPAIR    . IR GENERIC HISTORICAL  12/30/2015   IR ANGIO INTRA EXTRACRAN SEL COM CAROTID INNOMINATE BILAT MOD SED 12/30/2015 Julieanne Cotton, MD [facility]  . IR GENERIC HISTORICAL  12/30/2015   IR ANGIO VERTEBRAL SEL VERTEBRAL BILAT MOD SED 12/30/2015 Julieanne Cotton, MD [facility]  . TEE WITHOUT CARDIOVERSION N/A 04/19/2015   Procedure: TRANSESOPHAGEAL ECHOCARDIOGRAM (TEE);  Surgeon: Jake Bathe, MD;  Location: [facility] ENDOSCOPY;  Service: Cardiovascular;  Laterality: N/A;  . VAGINAL HYSTERECTOMY       OB History   None      Home Medications    Prior to Admission medications   Medication Sig Start Date End Date Taking? Authorizing Provider  aspirin EC 81 MG tablet Take 1 tablet by mouth every morning. 02/24/15  Yes [provider]  clopidogrel (PLAVIX) 75 MG tablet Take 1 tablet (75 mg total) by mouth daily. 01/15/16  Yes Jaffe, Adam R, DO  Coenzyme Q10 (CO Q-10) 200 MG CAPS Take 200 mg by mouth every evening.   Yes [provider]  glimepiride (AMARYL) 2 MG tablet Take 2 mg by mouth daily with breakfast.   Yes [provider]  insulin detemir (LEVEMIR) 100 UNIT/ML injection Inject 40 Units into the skin daily before breakfast.    Yes [provider]  lisinopril (PRINIVIL,ZESTRIL) 40 MG tablet Take 1 tablet (40 mg total) by mouth daily. 01/06/17  Yes Runell Gess, MD  Magnesium 500 MG TABS Take 500 mg by mouth every evening.    Yes [provider]  Riboflavin (B2) 100 MG TABS Take 200 mg by mouth every morning.   Yes [provider]  rosuvastatin (CRESTOR) 40 MG tablet Take 1 tablet (40 mg total) by mouth at bedtime. 01/01/16  Yes Reymundo Poll, MD  divalproex (DEPAKOTE) 500 MG DR tablet Take 1 tablet (500 mg total) by mouth 2 (two) times daily. Patient not taking: Reported on 07/11/2017 01/01/16   Reymundo Poll, MD    Family History Family History  Problem Relation Age of Onset  . Stroke Father        24s  . Heart attack Father 5  . COPD Mother   . Heart failure Brother   . Cancer Maternal Grandmother        unknown   . COPD Unknown   . Heart failure Maternal Grandfather   . Hypertension Maternal Grandfather   . Cancer Paternal Grandfather        lung  . Diabetes Paternal Grandmother     Social History Social History   Tobacco Use  . Smoking status: Never Smoker  . Smokeless tobacco: Never Used  Substance Use Topics  . Alcohol use: No    Alcohol/week: 0.0 oz    Comment: rare   . Drug use: No     Allergies   Erythromycin and Latex   Review of Systems Review of Systems  All other systems reviewed and are negative.    Physical Exam Updated Vital Signs BP (!) 195/73   Pulse 81   Temp 98.8 F (37.1 C) (Oral)   Resp 13   SpO2 100%   Physical Exam  Constitutional: She is oriented to person, place, and time. She appears well-developed.  Obese, appears older than stated age.  HENT:  Head: Normocephalic and atraumatic.  Right Ear: External ear normal.  Left Ear: External ear normal.  Eyes: Pupils are equal, round, and reactive to light. Conjunctivae and EOM are normal.  Neck: Normal range of motion and phonation normal. Neck supple.  Cardiovascular: Normal rate, regular  rhythm and normal heart sounds.  Pulmonary/Chest: Effort normal and breath sounds normal. She exhibits no bony tenderness.  Abdominal: Soft. There is no tenderness.  Musculoskeletal: Normal range of motion.  Neurological: She is alert and oriented to person, place, and time. No cranial nerve deficit or sensory deficit. She exhibits normal muscle tone. Coordination normal.  Mild dysarthria and aphasia.  Somewhat decreased grip strength right hand and mild right facial droop.  No asymmetry of the leg strength.  Skin: Skin is warm, dry and intact.  Psychiatric: She has a normal mood and affect. Her behavior is normal. Judgment and thought content normal.  Nursing note and vitals reviewed.    ED Treatments / Results  Labs (all labs ordered are listed,  but only abnormal results are displayed) Labs Reviewed  APTT - Abnormal; Notable for the following components:      Result Value   aPTT 38 (*)    All other components within normal limits  CBC - Abnormal; Notable for the following components:   RBC 3.34 (*)    Hemoglobin 9.5 (*)    HCT 30.1 (*)    All other components within normal limits  COMPREHENSIVE METABOLIC PANEL - Abnormal; Notable for the following components:   Glucose, Bld 145 (*)    BUN 24 (*)    Creatinine, Ser 1.12 (*)    Albumin 2.8 (*)    ALT 11 (*)    GFR calc non Af Amer 55 (*)    All other components within normal limits  URINALYSIS, ROUTINE W REFLEX MICROSCOPIC - Abnormal; Notable for the following components:   Color, Urine AMBER (*)    APPearance CLOUDY (*)    Glucose, UA 50 (*)    Hgb urine dipstick SMALL (*)    Protein, ur >=300 (*)    All other components within normal limits  I-STAT CHEM 8, ED - Abnormal; Notable for the following components:   BUN 24 (*)    Creatinine, Ser 1.10 (*)    Glucose, Bld 143 (*)    Hemoglobin 9.2 (*)    HCT 27.0 (*)    All other components within normal limits  I-STAT BETA HCG BLOOD, ED ([facility], [facility], [facility] ONLY) - Abnormal; Notable  for the following components:   I-stat hCG, quantitative 11.7 (*)    All other components within normal limits  CBG MONITORING, ED - Abnormal; Notable for the following components:   Glucose-Capillary 63 (*)    All other components within normal limits  ETHANOL  PROTIME-INR  DIFFERENTIAL  RAPID URINE DRUG SCREEN, HOSP PERFORMED  I-STAT TROPONIN, ED  CBG MONITORING, ED    EKG None  Radiology No results found.  Procedures Procedures (including critical care time)  Medications Ordered in ED Medications  LORazepam (ATIVAN) injection 1 mg (1 mg Intravenous Given 07/11/17 2107)  dextrose 50 % solution 50 mL (50 mLs Intravenous Given 07/11/17 2003)     Initial Impression / Assessment and Plan / ED Course  I have reviewed the triage vital signs and the nursing notes.  Pertinent labs & imaging results that were available during my care of the patient were reviewed by me and considered in my medical decision making (see chart for details).      Patient Vitals for the past 24 hrs:  BP Temp Temp src Pulse Resp SpO2  07/11/17 2030 (!) 195/73 - - 81 13 100 %  07/11/17 2000 (!) 191/76 - - 81 17 99 %  07/11/17 1950 (!) 187/78 - - 85 18 100 %  07/11/17 1930 - - - 78 16 100 %  07/11/17 1900 - - - 84 18 100 %  07/11/17 1830 - - - 79 15 98 %  07/11/17 1800 - - - 75 18 96 %  07/11/17 1715 (!) 182/78 - - 80 15 100 %  07/11/17 1700 (!) 179/79 - - 82 19 100 %  07/11/17 1645 (!) 187/82 - - 81 - 100 %  07/11/17 1624 (!) 197/66 98.8 F (37.1 C) Oral 81 (!) 100 100 %    10:19 PM Reevaluation with update and discussion. After initial assessment and treatment, an updated evaluation reveals patient resting comfortably and has no further complaints.  Patient and patient's daughter updated on  findings and plan.  Daughter understands that the patient's hypertension is okay, and does not need to be lowered acutely because of the potential for causing a stroke syndrome.  Patient will need monitoring  as an outpatient to control blood pressure and other risk factors.  She is currently scheduled to see her neurologist and get referral to both cardiology and neurology. Mancel Bale   Medical decision making-patient presenting with possible mild exacerbation of recent left brain stroke.  She had a comprehensive evaluation and treatment adjustment, at the time of discharge yesterday.  MRI ordered to look for extension of stroke, or new findings.  Mild high blood pressure, currently being treated, in patient with known atherosclerotic intracranial disease.  Nursing Notes Reviewed/ Care Coordinated Applicable Imaging Reviewed Interpretation of Laboratory Data incorporated into ED treatment  Plan-disposition home after MRI if no progression of strokes.     Final Clinical Impressions(s) / ED Diagnoses   Final diagnoses:  Cerebral infarction, unspecified mechanism [facility])    ED Discharge Orders    None       Mancel Bale, MD 07/12/17 1404

## 2017-07-12 ENCOUNTER — Encounter: Payer: Self-pay | Admitting: Neurology

## 2017-07-12 ENCOUNTER — Encounter (HOSPITAL_COMMUNITY): Payer: Medicaid Other

## 2017-07-12 ENCOUNTER — Encounter (HOSPITAL_COMMUNITY): Payer: Self-pay | Admitting: Emergency Medicine

## 2017-07-12 LAB — IRON AND TIBC
IRON: 42 ug/dL (ref 28–170)
Saturation Ratios: 18 % (ref 10.4–31.8)
TIBC: 239 ug/dL — AB (ref 250–450)
UIBC: 197 ug/dL

## 2017-07-12 LAB — GLUCOSE, CAPILLARY
GLUCOSE-CAPILLARY: 209 mg/dL — AB (ref 65–99)
GLUCOSE-CAPILLARY: 113 mg/dL — AB (ref 65–99)

## 2017-07-12 LAB — CBG MONITORING, ED
GLUCOSE-CAPILLARY: 111 mg/dL — AB (ref 65–99)
GLUCOSE-CAPILLARY: 166 mg/dL — AB (ref 65–99)
GLUCOSE-CAPILLARY: 167 mg/dL — AB (ref 65–99)
GLUCOSE-CAPILLARY: 49 mg/dL — AB (ref 65–99)
GLUCOSE-CAPILLARY: 84 mg/dL (ref 65–99)
Glucose-Capillary: 82 mg/dL (ref 65–99)
Glucose-Capillary: 79 mg/dL (ref 65–99)

## 2017-07-12 LAB — RETICULOCYTES
RBC.: 3.25 MIL/uL — ABNORMAL LOW (ref 3.87–5.11)
RETIC CT PCT: 0.7 % (ref 0.4–3.1)
Retic Count, Absolute: 22.8 10*3/uL (ref 19.0–186.0)

## 2017-07-12 MED ORDER — CLOPIDOGREL BISULFATE 75 MG PO TABS
75.0000 mg | ORAL_TABLET | Freq: Every day | ORAL | Status: DC
  Administered 2017-07-12 – 2017-07-13 (×2): 75 mg via ORAL
  Filled 2017-07-12 (×2): qty 1

## 2017-07-12 MED ORDER — ASPIRIN EC 81 MG PO TBEC
81.0000 mg | DELAYED_RELEASE_TABLET | Freq: Every day | ORAL | Status: DC
  Administered 2017-07-12 – 2017-07-13 (×2): 81 mg via ORAL
  Filled 2017-07-12 (×2): qty 1

## 2017-07-12 MED ORDER — DEXTROSE 50 % IV SOLN
INTRAVENOUS | Status: AC
  Filled 2017-07-12: qty 50

## 2017-07-12 MED ORDER — DEXTROSE 50 % IV SOLN
INTRAVENOUS | Status: AC
  Administered 2017-07-12: 50 mL
  Filled 2017-07-12: qty 50

## 2017-07-12 MED ORDER — LACTATED RINGERS IV SOLN
INTRAVENOUS | Status: AC
  Administered 2017-07-12: 02:00:00 via INTRAVENOUS

## 2017-07-12 MED ORDER — INSULIN DETEMIR 100 UNIT/ML ~~LOC~~ SOLN
20.0000 [IU] | Freq: Every day | SUBCUTANEOUS | Status: DC
  Administered 2017-07-13: 20 [IU] via SUBCUTANEOUS
  Filled 2017-07-12 (×2): qty 0.2

## 2017-07-12 MED ORDER — ROSUVASTATIN CALCIUM 20 MG PO TABS
40.0000 mg | ORAL_TABLET | Freq: Every day | ORAL | Status: DC
  Administered 2017-07-12: 40 mg via ORAL
  Filled 2017-07-12: qty 2
  Filled 2017-07-12: qty 1

## 2017-07-12 NOTE — ED Notes (Signed)
Dinner tray ordered.

## 2017-07-12 NOTE — Progress Notes (Signed)
Rehab Admissions Coordinator Note:  Patient was screened by Clois Dupes for appropriateness for an Inpatient Acute Rehab Consult per PT recommendation. Noted recent d/c from [facility] with [facility] recommendations. Therapy evaluation limited due to ED/environmental limitations today. Would recommend inpt rehab consult once pt admitted to floor and full therapy evaluations can be obtained. I will follow.  Clois Dupes 07/12/2017, 2:55 PM  I can be reached at [phone].

## 2017-07-12 NOTE — ED Notes (Addendum)
Kelly Romero [phone]. Note printed for daughter with patient's permission.

## 2017-07-12 NOTE — Progress Notes (Signed)
VASCULAR LAB    Patient had Carotid duplex 07/09/17 at [facility], exhibiting <50% stenosis.  Please see copied report below. Please advise if repeat carotid duplex is needed.   Study Result   CLINICAL DATA:  52 year old female with a history of right-sided cerebrovascular accident  EXAM: BILATERAL CAROTID DUPLEX ULTRASOUND  TECHNIQUE: Wallace Cullens scale imaging, color Doppler and duplex ultrasound were performed of bilateral carotid and vertebral arteries in the neck.  COMPARISON:  Prior duplex carotid ultrasound 10/02/2016  FINDINGS: Criteria: Quantification of carotid stenosis is based on velocity parameters that correlate the residual internal carotid diameter with NASCET-based stenosis levels, using the diameter of the distal internal carotid lumen as the denominator for stenosis measurement.  The following velocity measurements were obtained:  RIGHT ICA: 115/30 cm/sec CCA: 86/15 cm/sec  SYSTOLIC ICA/CCA RATIO:  1.0  ECA:  93 cm/sec  LEFT  ICA: 98/30 cm/sec  CCA: 86/16 cm/sec  SYSTOLIC ICA/CCA RATIO:  1.1  ECA:  122 cm/sec  RIGHT CAROTID ARTERY: Moderate heterogeneous and slightly irregular atherosclerotic plaque in the proximal internal carotid artery. By peak systolic velocity criteria, the estimated stenosis remains less than 50%.  RIGHT VERTEBRAL ARTERY:  Patent with normal antegrade flow.  LEFT CAROTID ARTERY: Trace heterogeneous and relatively smooth atherosclerotic plaque in the proximal internal carotid artery. By peak systolic velocity criteria, the estimated stenosis remains less than 50%.  LEFT VERTEBRAL ARTERY:  Patent with normal antegrade flow.  IMPRESSION: 1. Mild (1-49%) stenosis proximal right internal carotid artery secondary to moderate heterogeneous and slightly irregular atherosclerotic plaque. 2. Mild (1-49%) stenosis proximal left internal carotid artery secondary to minimal heterogeneous but smooth  atherosclerotic plaque. 3. Vertebral arteries are patent with normal antegrade flow.  Signed,  Sterling Big, MD  [facility]  [facility]   Electronically Signed   By: Malachy Moan M.D.   On: 07/09/2017 09:05   Thank you,   Miria Cappelli, RVT 07/12/2017, 7:22 AM

## 2017-07-12 NOTE — ED Notes (Signed)
Placed purewick on pt. 

## 2017-07-12 NOTE — Progress Notes (Signed)
Stroke Team Progress Note  Kelly Romero is an 52 y.o. female presenting with recurrent stroke symptoms since recent discharge from [facility] following a stroke stroke work up after she had presented with slurred speech and facial droop; MRI at that time had shown acute small infarcts in the left hemisphere. Patient's daughter feels that once the patient got home, her symptoms got worse. On awakening this morning her speech was much more slurred and she was unable to use her right side.   Excerpt of Hospital Course from [facility] discharge summary, 07/10/17:  "52 year old female with a history of TIA, diabetes and hypertension who presented with slurred speech and facial droop. 1. Acute small infarcts in the left hemisphere: Patient evaluated by physical therapy as well as neurology. MRI did show acute infarcts. Carotid Doppler showed no evidence of hemodynamically significant stenosis. Echocardiogram did not show evidence of PFO (echocardiogram was not completely completed due to the patient having panic attack). LDL was 72 and A1c 7.3 patient has had extensive work-up in the past. Neurology did not recommend repeating at this time. Recommendations are to continue Plavix and aspirin along with statin. Patient will follow-up with her neurologist and stroke specialist."  Work up at [facility] also included a motion degraded MRA head, which revealed diffuse intracranial atherosclerotic disease.  MRI obtained in the ED  On 07/11/2017 showed new strokes in 2 separate vascular territories.  MRI report conclusions: 1. Few small volume new acute ischemic infarcts involving the parasagittal cortical and subcortical/periventricular left frontal lobe, new relative to recent MRI from 07/09/2017. No associated hemorrhage or mass effect. 2. Normal expected interval evolution of recently identified small volume left-sided infarcts, relatively stable in size and distribution as compared to previous. 3. Otherwise  stable appearance of the brain with chronic atrophy and small vessel ischemic disease, with multiple scattered remote infarcts as above   SUBJECTIVE  patient is accompanied by her daughter and son-in-law at the bedside. She is having significant trouble speaking and communicating and weakness in the right arm.  OBJECTIVE Most recent Vital Signs: Temp: 98 F (36.7 C) (04/29 1430) BP: 191/74 (04/29 1400) Pulse Rate: 84 (04/29 1400) Respiratory Rate: 16 O2 Saturdation: 97%  CBG (last 3)  Recent Labs    07/12/17 0511 07/12/17 0710 07/12/17 0919  GLUCAP 82 84 79    Diet: Fall precautions Aspiration precautions DIET - DYS 1 Room service appropriate? Yes; Fluid consistency: Thin   liquids  Activity: bedrest VTE Prophylaxis:  SCDs Studies: Results for orders placed or performed during the hospital encounter of 07/11/17 (from the past 24 hour(s))  Ethanol     Status: None   Collection Time: 07/11/17  5:17 PM  Result Value Ref Range   Alcohol, Ethyl (B) <10 <10 mg/dL  Protime-INR     Status: None   Collection Time: 07/11/17  5:17 PM  Result Value Ref Range   Prothrombin Time 12.9 11.4 - 15.2 seconds   INR 0.98   APTT     Status: Abnormal   Collection Time: 07/11/17  5:17 PM  Result Value Ref Range   aPTT 38 (H) 24 - 36 seconds  CBC     Status: Abnormal   Collection Time: 07/11/17  5:17 PM  Result Value Ref Range   WBC 4.8 4.0 - 10.5 K/uL   RBC 3.34 (L) 3.87 - 5.11 MIL/uL   Hemoglobin 9.5 (L) 12.0 - 15.0 g/dL   HCT 40.9 (L) 81.1 - 91.4 %   MCV 90.1 78.0 -  100.0 fL   MCH 28.4 26.0 - 34.0 pg   MCHC 31.6 30.0 - 36.0 g/dL   RDW 16.1 09.6 - 04.5 %   Platelets 259 150 - 400 K/uL  Differential     Status: None   Collection Time: 07/11/17  5:17 PM  Result Value Ref Range   Neutrophils Relative % 63 %   Neutro Abs 3.0 1.7 - 7.7 K/uL   Lymphocytes Relative 28 %   Lymphs Abs 1.4 0.7 - 4.0 K/uL   Monocytes Relative 7 %   Monocytes Absolute 0.3 0.1 - 1.0 K/uL   Eosinophils  Relative 2 %   Eosinophils Absolute 0.1 0.0 - 0.7 K/uL   Basophils Relative 0 %   Basophils Absolute 0.0 0.0 - 0.1 K/uL  Comprehensive metabolic panel     Status: Abnormal   Collection Time: 07/11/17  5:17 PM  Result Value Ref Range   Sodium 142 135 - 145 mmol/L   Potassium 4.2 3.5 - 5.1 mmol/L   Chloride 108 101 - 111 mmol/L   CO2 27 22 - 32 mmol/L   Glucose, Bld 145 (H) 65 - 99 mg/dL   BUN 24 (H) 6 - 20 mg/dL   Creatinine, Ser 4.09 (H) 0.44 - 1.00 mg/dL   Calcium 9.7 8.9 - 81.1 mg/dL   Total Protein 6.7 6.5 - 8.1 g/dL   Albumin 2.8 (L) 3.5 - 5.0 g/dL   AST 16 15 - 41 U/L   ALT 11 (L) 14 - 54 U/L   Alkaline Phosphatase 66 38 - 126 U/L   Total Bilirubin 0.6 0.3 - 1.2 mg/dL   GFR calc non Af Amer 55 (L) >60 mL/min   GFR calc Af Amer >60 >60 mL/min   Anion gap 7 5 - 15  I-stat troponin, ED     Status: None   Collection Time: 07/11/17  5:25 PM  Result Value Ref Range   Troponin i, poc 0.02 0.00 - 0.08 ng/mL   Comment 3          I-Stat beta hCG blood, ED     Status: Abnormal   Collection Time: 07/11/17  5:25 PM  Result Value Ref Range   I-stat hCG, quantitative 11.7 (H) <5 mIU/mL   Comment 3          I-Stat Chem 8, ED     Status: Abnormal   Collection Time: 07/11/17  5:27 PM  Result Value Ref Range   Sodium 143 135 - 145 mmol/L   Potassium 4.2 3.5 - 5.1 mmol/L   Chloride 109 101 - 111 mmol/L   BUN 24 (H) 6 - 20 mg/dL   Creatinine, Ser 9.14 (H) 0.44 - 1.00 mg/dL   Glucose, Bld 782 (H) 65 - 99 mg/dL   Calcium, Ion 9.56 2.13 - 1.40 mmol/L   TCO2 25 22 - 32 mmol/L   Hemoglobin 9.2 (L) 12.0 - 15.0 g/dL   HCT 08.6 (L) 57.8 - 46.9 %  Urine rapid drug screen (hosp performed)     Status: None   Collection Time: 07/11/17  7:05 PM  Result Value Ref Range   Opiates NONE DETECTED NONE DETECTED   Cocaine NONE DETECTED NONE DETECTED   Benzodiazepines NONE DETECTED NONE DETECTED   Amphetamines NONE DETECTED NONE DETECTED   Tetrahydrocannabinol NONE DETECTED NONE DETECTED    Barbiturates NONE DETECTED NONE DETECTED  Urinalysis, Routine w reflex microscopic     Status: Abnormal   Collection Time: 07/11/17  7:05 PM  Result Value  Ref Range   Color, Urine AMBER (A) YELLOW   APPearance CLOUDY (A) CLEAR   Specific Gravity, Urine 1.019 1.005 - 1.030   pH 6.0 5.0 - 8.0   Glucose, UA 50 (A) NEGATIVE mg/dL   Hgb urine dipstick SMALL (A) NEGATIVE   Bilirubin Urine NEGATIVE NEGATIVE   Ketones, ur NEGATIVE NEGATIVE mg/dL   Protein, ur >=409 (A) NEGATIVE mg/dL   Nitrite NEGATIVE NEGATIVE   Leukocytes, UA NEGATIVE NEGATIVE   RBC / HPF 0-5 0 - 5 RBC/hpf   WBC, UA 0-5 0 - 5 WBC/hpf   Bacteria, UA NONE SEEN NONE SEEN   Squamous Epithelial / LPF 0-5 0 - 5  CBG monitoring, ED     Status: Abnormal   Collection Time: 07/11/17  7:59 PM  Result Value Ref Range   Glucose-Capillary 63 (L) 65 - 99 mg/dL  CBG monitoring, ED     Status: None   Collection Time: 07/11/17  9:11 PM  Result Value Ref Range   Glucose-Capillary 98 65 - 99 mg/dL  CBG monitoring, ED     Status: Abnormal   Collection Time: 07/12/17 12:18 AM  Result Value Ref Range   Glucose-Capillary 49 (L) 65 - 99 mg/dL   Comment 1 Document in Chart   CBG monitoring, ED     Status: Abnormal   Collection Time: 07/12/17 12:49 AM  Result Value Ref Range   Glucose-Capillary 166 (H) 65 - 99 mg/dL   Comment 1 Document in Chart   CBG monitoring, ED     Status: Abnormal   Collection Time: 07/12/17  2:10 AM  Result Value Ref Range   Glucose-Capillary 111 (H) 65 - 99 mg/dL  CBG monitoring, ED     Status: None   Collection Time: 07/12/17  5:11 AM  Result Value Ref Range   Glucose-Capillary 82 65 - 99 mg/dL  CBG monitoring, ED     Status: None   Collection Time: 07/12/17  7:10 AM  Result Value Ref Range   Glucose-Capillary 84 65 - 99 mg/dL  CBG monitoring, ED     Status: None   Collection Time: 07/12/17  9:19 AM  Result Value Ref Range   Glucose-Capillary 79 65 - 99 mg/dL     Mr Brain Wo Contrast (neuro  Protocol)  Result Date: 07/11/2017 CLINICAL DATA:  Initial evaluation for worsening right-sided weakness with slurred speech, recent stroke. EXAM: MRI HEAD WITHOUT CONTRAST TECHNIQUE: Multiplanar, multiecho pulse sequences of the brain and surrounding structures were obtained without intravenous contrast. COMPARISON:  Prior MRI from 07/09/2017. FINDINGS: Brain: Age-related cerebral atrophy with chronic small vessel ischemic disease again noted. Encephalomalacia with gliosis within the parasagittal left parietal lobe consistent with remote ischemic infarct. Small remote lacunar infarcts present within the bilateral basal ganglia/corona radiata as well as the left thalamus. Chronic hemorrhagic blood products present about several of these infarcts. Continued interval evolution of recently identified small ischemic infarcts involving the left periventricular white matter, relatively stable in size and distribution as compared to previous. No evidence for hemorrhagic transformation or significant mass effect. There are several small areas of new acute infarction involving the cortical gray matter and underlying periventricular white matter of the parasagittal right frontal lobe (series 3, image 34, 32). Largest area of infarct measures 12 mm. No associated hemorrhage or mass effect. No other areas of new or interval infarction. Gray-white matter differentiation otherwise maintained. No mass lesion, midline shift or mass effect. No hydrocephalus. No extra-axial fluid collection. Major dural sinuses are  grossly patent. Pituitary gland suprasellar region normal. Midline structures intact. Vascular: Major intracranial vascular flow voids are maintained at the skull base. Skull and upper cervical spine: Craniocervical junction normal. Upper cervical spine normal. Bone marrow signal intensity within normal limits. Hyperostosis frontalis interna noted. No scalp soft tissue abnormality. Sinuses/Orbits: Globes and orbital soft  tissues within normal limits. Patient status post lens extraction bilaterally. Mild scattered mucosal thickening within the ethmoidal air cells and maxillary sinuses. No air-fluid level to suggest acute sinusitis. No mastoid effusion. Inner ear structures normal. Other: None. IMPRESSION: 1. Few small volume new acute ischemic infarcts involving the parasagittal cortical and subcortical/periventricular left frontal lobe, new relative to recent MRI from 07/09/2017. No associated hemorrhage or mass effect. 2. Normal expected interval evolution of recently identified small volume left-sided infarcts, relatively stable in size and distribution as compared to previous. 3. Otherwise stable appearance of the brain with chronic atrophy and small vessel ischemic disease, with multiple scattered remote infarcts as above. Electronically Signed   By: Rise Mu M.D.   On: 07/11/2017 22:44    Physical Exam:    Present middle-age Caucasian lady currently not in distress. She appears frustrated. She has some emotional lability. . Afebrile. Head is nontraumatic. Neck is supple without bruit.    Cardiac exam no murmur or gallop. Lungs are clear to auscultation. Distal pulses are well felt. Neurological Exam : Awake alert globally aphasic with hesitant and nonfluent speech and speaks only few words and occasional short sentences. Able to follow only simple midline and few 1 step commands.decreased blink to threat on the right compared to the left. Right lower facial weakness. Tongue midline. Pseudobulbar facies with decreased facial expression.. Brisk jaw jerk. Motor system exam reveals right upper extremity weakness with on the 2-3/5 strength in the right approximately good antigravity strength in the left upper extremity. Able to move left lower extremity off the bed but does have right lower extremity weakness but can do drop briskly to pain. Not cooperative for detailed testing of strength in the lower  extremities. Sensation appears preserved and reacts to pain in all 4 extremities. Tendon reflexes are brisk. Plantars downgoing. Not cooperative for testing cerebellar function. Gait deferred. ASSESSMENT Ms. Kelly Romero is a 52 y.o. female with multiple strokes over the last few years in different vascular distributions of both cortical and subcortical involvement with multiple uncontrolled risk factors as well as findings of advanced intracranial atherosclerosis.  Vascular risk factors of diabetes, hypertension, hyperlipidemia,prior cerebrovascular disease and intracranial atherosclerosis. Negative prior workup so far :TEE 01/14/15 was negative. Transcranial Doppler bubble studies on 12/30/2015 showed few high intensity transient signals suggestive of likely only a trivial PFO Spinal tap 12/28/15 was unremarkable.hypercoagulable panel, HIV and RPR on 12/28/15 were also unremarkable. Hospital day # 1  TREATMENT/PLAN  Continue dual antiplatelet therapy for 3 months given the presence of advanced central atherosclerosis.prior lab work for vasculitis and hypercoagulable state,TEE and spinal tap have all been negative. Recommend  loop recorder insertion .Discussed with family at the bedside and answered questions. Therapy and rehabilitation consult. Greater than 50% time during this 35 minute visit was spent on counseling and coordination of care note of multiple strokes and answering questions. Discussed with Dr. Dow Adolph M.D. Delia Heady, MD [facility] Pager: [phone] 07/12/2017 2:50 PM

## 2017-07-12 NOTE — ED Notes (Signed)
Report given to Nikki.

## 2017-07-12 NOTE — Progress Notes (Signed)
Received a call from CCMD that pt had about 9 runs of SVT, pt quiet and sleeping in bed, denies any discomfort, NP Maren Reamer paged to be notified, no new orders at this time, will however continue to monitor. Obasogie-Asidi, Rennie Hack Efe

## 2017-07-12 NOTE — ED Notes (Signed)
Checked patient blood sugar it was 79 notified RN Lanora Manis of blood sugar patient is resting with family at bedside and call bell in reach

## 2017-07-12 NOTE — Progress Notes (Signed)
this is a 52 year old woman with medical problems including insulin-dependent diabetes, recent history of stroke, hyperlipidemia. previously treated with PCS K-9 inhibitor, hypertension, obesity who is presenting with worsening speech and right-sided facial and right upper extremity numbness.  History is obtained primarily through the patient's daughter due to the patient's inability to communicate due to aphasia.  Patient was admitted to [facility] on April 25 and discharged on 07/10/2017 where she presented with slurred speech and right facial droop Workup at that time included acute small infarcts in the left hemisphere.  The patient was discharged home. Family reports that she continued to have slurred speech and a mild right facial droop, in the morning on 07/11/2017 she had noticeable worsening speech as well as new onset right facial and right upper extremity numbness. These are symptoms prompted reevaluation at [facility] ED.  07/12/17: Patient seen and examined with her daughter at bedside in the emergency department.  She is aphasic.  Unable to obtain a review of system.  Has no new complaints.  Neurology following.  Highly appreciated.  Please refer to H&P dictated by Dr. Rema Jasmine on 07/12/2017 for further details of the assessment and plan.

## 2017-07-12 NOTE — Evaluation (Signed)
Physical Therapy Evaluation Patient Details Name: Kelly Romero MRN: [id number] DOB: [birth year] Today's Date: 07/12/2017   History of Present Illness  Pt. is a 52 y.o. female who was admitted to [facility] with Aphasia, RUE weakness, slurred speech, and difficulty with word finding. Pt. has a PMHx of CVA, TIA, DM, Headache, Hyperlipidemia, HTN, TIA, Appendectomy, Cholecystectomy, Hernia Repair. She went home with family and had worsening of speech and returned to [facility] ED. MRI showed new frontal lobe infarcts compared to MRI on 07/09/17.   Clinical Impression  Pt admitted with above diagnosis. Pt currently with functional limitations due to the deficits listed below (see PT Problem List). PT eval limited partially by environment, pt being on gurney with inability to safely stand from it, as well as by lethargy, having difficulty maintaining eyes open. Pt able to get to EOB with increased time and supervision but needed min A to return to supine. Decreased LE strength noted in supine as well as in sitting. Pt needed mod A to scoot at EOB. Will likely need post acute rehab this time, recommending CIR consult.   Pt will benefit from skilled PT to increase their independence and safety with mobility to allow discharge to the venue listed below.       Follow Up Recommendations CIR;Supervision/Assistance - 24 hour    Equipment Recommendations  Other (comment)(TBD)    Recommendations for Other Services OT consult;Rehab consult;Speech consult     Precautions / Restrictions Precautions Precautions: Fall Restrictions Weight Bearing Restrictions: No      Mobility  Bed Mobility Overal bed mobility: Needs Assistance Bed Mobility: Supine to Sit;Sit to Supine     Supine to sit: Supervision Sit to supine: Min assist   General bed mobility comments: pt able to get to EOB with use of rail but no physical assist but increased time needed and pt giving max effort. Min A for LE"s into bed with return to  sitting and min A to position in center of bed  Transfers Overall transfer level: Needs assistance   Transfers: Lateral/Scoot Transfers          Lateral/Scoot Transfers: Mod assist General transfer comment: pt on gurney in ED, unsafe to attempt standing because pt's feet could not reach floor when sitting EOB. Scooted to R in sitting, required mod A for hip motion and support  Ambulation/Gait             General Gait Details: unable to test in current environment  Stairs            Wheelchair Mobility    Modified Rankin (Stroke Patients Only) Modified Rankin (Stroke Patients Only) Pre-Morbid Rankin Score: Moderate disability Modified Rankin: Severe disability     Balance Overall balance assessment: History of Falls;Needs assistance Sitting-balance support: Feet unsupported Sitting balance-Leahy Scale: Fair Sitting balance - Comments: able to maintain static balance EOB                                     Pertinent Vitals/Pain Pain Assessment: No/denies pain    Home Living Family/patient expects to be discharged to:: Private residence Living Arrangements: Children Available Help at Discharge: Available 24 hours/day;Family Type of Home: House Home Access: Stairs to enter Entrance Stairs-Rails: Can reach both Entrance Stairs-Number of Steps: 4 Home Layout: One level Home Equipment: Walker - 4 wheels;Shower seat Additional Comments: pt lives with her daughter and her family. She has a  lift chair at home in additon to equipment above    Prior Function Level of Independence: Needs assistance   Gait / Transfers Assistance Needed: prior to admit to [facility], pt was ambulating with rollator  ADL's / Homemaking Assistance Needed: prior to admit to [facility], daughter was helping with bathing, dressing, and home mgmt        Hand Dominance   Dominant Hand: Right    Extremity/Trunk Assessment   Upper Extremity Assessment Upper Extremity  Assessment: Defer to OT evaluation    Lower Extremity Assessment Lower Extremity Assessment: RLE deficits/detail;LLE deficits/detail;Generalized weakness RLE Deficits / Details: hip flex 3/5, knee ext 2+/5, knee flex 3/5. Lacking sufficient strength to extend knee fully against gravity RLE Sensation: decreased light touch RLE Coordination: decreased gross motor LLE Deficits / Details: hip flex 3/5, knee ext 3/5, knee flex 3/5    Cervical / Trunk Assessment Cervical / Trunk Assessment: Normal  Communication   Communication: Expressive difficulties  Cognition Arousal/Alertness: Lethargic Behavior During Therapy: Flat affect Overall Cognitive Status: Impaired/Different from baseline Area of Impairment: Following commands;Memory                     Memory: Decreased short-term memory Following Commands: Follows one step commands with increased time       General Comments: pt with very slow speech and reports several times during history that she could not remember.       General Comments      Exercises     Assessment/Plan    PT Assessment Patient needs continued PT services  PT Problem List Decreased strength;Decreased range of motion;Decreased activity tolerance;Decreased balance;Decreased mobility;Decreased coordination;Decreased cognition       PT Treatment Interventions DME instruction;Gait training;Functional mobility training;Therapeutic activities;Stair training;Therapeutic exercise;Balance training;Neuromuscular re-education;Cognitive remediation;Patient/family education    PT Goals (Current goals can be found in the Care Plan section)  Acute Rehab PT Goals Patient Stated Goal: To retun home PT Goal Formulation: With patient Time For Goal Achievement: 07/26/17 Potential to Achieve Goals: Good    Frequency Min 4X/week   Barriers to discharge        Co-evaluation               AM-PAC PT "6 Clicks" Daily Activity  Outcome Measure Difficulty  turning over in bed (including adjusting bedclothes, sheets and blankets)?: A Little Difficulty moving from lying on back to sitting on the side of the bed? : A Lot Difficulty sitting down on and standing up from a chair with arms (e.g., wheelchair, bedside commode, etc,.)?: Unable Help needed moving to and from a bed to chair (including a wheelchair)?: A Lot Help needed walking in hospital room?: A Lot Help needed climbing 3-5 steps with a railing? : Total 6 Click Score: 11    End of Session   Activity Tolerance: Patient limited by lethargy Patient left: in bed;with call bell/phone within reach Nurse Communication: Mobility status PT Visit Diagnosis: Muscle weakness (generalized) (M62.81);Difficulty in walking, not elsewhere classified (R26.2)    Time: 4098-1191 PT Time Calculation (min) (ACUTE ONLY): 14 min   Charges:   PT Evaluation $PT Eval Moderate Complexity: 1 Mod     PT G Codes:        Lyanne Co, PT  Acute Rehab Services  [phone]   Lawana Chambers Faryn Sieg 07/12/2017, 2:31 PM

## 2017-07-12 NOTE — Progress Notes (Signed)
OT Evaluation  PTA, pt lived at home with her daughter and her family. Pt reports she was able to care for herself but per  daughter's report, pt required min A with ADL and IADL tasks and had multiple falls at home. Pt sitting EOB, incontinent of urine, labile and crying throughout evaluation. Pt with apparent R sided weakness (Arm weaker than leg) communication and cognitive deficits as listed below. At this time, recommend rehab at [facility] to maximize functional level of independence and facilitate safe DC home. With family. Will follow acutely to address goals and DC plan.     07/12/17 1600  OT Visit Information  Last OT Received On 07/12/17  Assistance Needed +2 (for safety)  History of Present Illness Pt. is a 52 y.o. female who was admitted to [facility] with Aphasia, RUE weakness, slurred speech, and difficulty with word finding. Pt. has a PMHx of CVA, TIA, DM, Headache, Hyperlipidemia, HTN, TIA, Appendectomy, Cholecystectomy, Hernia Repair. She went home with family and had worsening of speech and returned to [facility] ED. MRI showed new L frontal lobe infarcts compared to MRI on 07/09/17.   Precautions  Precautions Fall  Restrictions  Weight Bearing Restrictions No  Home Living  Family/patient expects to be discharged to: Private residence  Living Arrangements Children  Available Help at Discharge Available 24 hours/day;Family  Type of Home House  Home Access Stairs to enter  Entrance Stairs-Number of Steps 4  Entrance Stairs-Rails Can reach both  Home Layout One level  Bathroom Shower/Tub Tub/shower unit  Engineer, water - 4 wheels;Shower seat  Additional Comments pt lives with her daughter and her family. She has a lift chair at home in additon to equipment above  Prior Function  Level of Independence Needs assistance  Gait / Transfers Assistance Needed prior to admit to [facility], pt was ambulating with rollator  ADL's / Homemaking Assistance Needed prior to  admit to [facility], daughter was helping with bathing, dressing, and home mgmt  Communication  Communication Expressive difficulties  Pain Assessment  Pain Assessment Faces  Faces Pain Scale 0  Cognition  Arousal/Alertness Awake/alert  Behavior During Therapy Flat affect (tearful)  Overall Cognitive Status No family/caregiver present to determine baseline cognitive functioning  Area of Impairment Following commands;Memory;Safety/judgement;Awareness;Attention;Problem solving;Orientation  Orientation Level Disoriented to;Time ("August 2002")  Current Attention Level Sustained  Memory Decreased short-term memory  Following Commands Follows one step commands with increased time  Safety/Judgement Decreased awareness of safety;Decreased awareness of deficits  Awareness Emergent  Problem Solving Slow processing;Decreased initiation;Difficulty sequencing;Requires verbal cues;Requires tactile cues  Upper Extremity Assessment  Upper Extremity Assessment RUE deficits/detail  RUE Deficits / Details generalized weakness; ROM grossly intact; difficulty with fine motor/coordination. Attempting to use for functional tasks but having difficulty. need sot use L non-dominant UE to complete tasks.   RUE Sensation decreased light touch ("feels different/heavy")  RUE Coordination decreased fine motor;decreased gross motor  Lower Extremity Assessment  Lower Extremity Assessment Defer to PT evaluation  Cervical / Trunk Assessment  Cervical / Trunk Assessment Normal  ADL  Overall ADL's  Needs assistance/impaired  Grooming Minimal assistance;Sitting  Upper Body Bathing Minimal assistance;Sitting  Lower Body Bathing Moderate assistance;Sit to/from stand  Upper Body Dressing  Moderate assistance;Sitting  Lower Body Dressing Moderate assistance;Sit to/from Scientist, research (life sciences) Stand-pivot;Minimal assistance;BSC  Toileting- Architect and Hygiene Maximal assistance  Toileting - Clothing Manipulation  Details (indicate cue type and reason) incontinent  Functional mobility during ADLs Minimal assistance;Cueing for safety;Cueing  for sequencing  Vision- History  Baseline Vision/History Wears glasses  Wears Glasses Reading only  Patient Visual Report No change from baseline  Vision- Assessment  Additional Comments Will further assess  Perception  Perception Tested?  (appears intact. will further assess)  Praxis  Praxis tested? Deficits  Deficits Initiation  Praxis-Other Comments will further assess  Bed Mobility  Overal bed mobility Needs Assistance  Bed Mobility Supine to Sit;Sit to Supine  Supine to sit Supervision  Sit to supine Min assist  Transfers  Overall transfer level Needs assistance  Transfers Sit to/from Stand;Stand Pivot Transfers  Sit to Stand Min assist  Stand pivot transfers Min assist  Balance  Overall balance assessment History of Falls;Needs assistance  Sitting-balance support Feet unsupported  Sitting balance-Leahy Scale Fair  Standing balance-Leahy Scale Poor  Standing balance comment dependent on external support  OT - End of Session  Equipment Utilized During Treatment Gait belt  Activity Tolerance Patient tolerated treatment well  Patient left in bed;with call bell/phone within reach;with bed alarm set  Nurse Communication Mobility status  OT Assessment  OT Recommendation/Assessment Patient needs continued OT Services  OT Visit Diagnosis Other abnormalities of gait and mobility (R26.89);Muscle weakness (generalized) (M62.81);History of falling (Z91.81);Other symptoms and signs involving cognitive function;Hemiplegia and hemiparesis  Hemiplegia - Right/Left Right  Hemiplegia - dominant/non-dominant Dominant  Hemiplegia - caused by Cerebral infarction  OT Problem List Decreased strength;Decreased range of motion;Decreased activity tolerance;Impaired balance (sitting and/or standing);Decreased coordination;Decreased cognition;Decreased safety  awareness;Decreased knowledge of use of DME or AE;Impaired sensation;Obesity;Impaired UE functional use  OT Plan  OT Frequency (ACUTE ONLY) Min 2X/week  OT Treatment/Interventions (ACUTE ONLY) Self-care/ADL training;Therapeutic exercise;Neuromuscular education;DME and/or AE instruction;Therapeutic activities;Cognitive remediation/compensation;Visual/perceptual remediation/compensation;Patient/family education;Balance training  AM-PAC OT "6 Clicks" Daily Activity Outcome Measure  Help from another person eating meals? 3  Help from another person taking care of personal grooming? 3  Help from another person toileting, which includes using toliet, bedpan, or urinal? 2  Help from another person bathing (including washing, rinsing, drying)? 2  Help from another person to put on and taking off regular upper body clothing? 3  Help from another person to put on and taking off regular lower body clothing? 2  6 Click Score 15  ADL G Code Conversion CK  OT Recommendation  Recommendations for Other Services Rehab consult  Follow Up Recommendations Supervision/Assistance - 24 hour  OT Equipment 3 in 1 bedside commode  Individuals Consulted  Consulted and Agree with Results and Recommendations Patient  Acute Rehab OT Goals  Patient Stated Goal to get better  OT Goal Formulation With patient  Time For Goal Achievement 07/26/17  Potential to Achieve Goals Good  OT Time Calculation  OT Start Time (ACUTE ONLY) 1605  OT Stop Time (ACUTE ONLY) 1630  OT Time Calculation (min) 25 min  OT General Charges  $OT Visit 1 Visit  OT Evaluation  $OT Eval Moderate Complexity 1 Mod  OT Treatments  $Self Care/Home Management  8-22 mins  Written Expression  Dominant Hand Right  [place], OT/L  [phone] 07/12/2017

## 2017-07-12 NOTE — Progress Notes (Signed)
Patient arrived on unit, family notified of transfer, patient resting comforably

## 2017-07-12 NOTE — ED Notes (Addendum)
RN paged MD regarding blood pressure. RN discussed with Dr. Margo Aye- no new orders obtained.  Pt updated

## 2017-07-12 NOTE — H&P (Signed)
History and Physical   Kelly Romero FAO:[id number] DOB: 08-Jul-1965 DOA: 07/11/2017  PCP: Noni Saupe, MD  Chief Complaint: DIFFICULTY SPEAKING  HPI: this is a 52 year old woman with medical problems including insulin-dependent diabetes, recent history of stroke, hyperlipidemia. previously treated with PCS K-9 inhibitor, hypertension, obesity who is presenting with worsening speech and right-sided facial and right upper extremity numbness.  History is obtained primarily through the patient's daughter due to the patient's inability to communicate due to aphasia.  Patient was admitted to [facility] on April 25 and discharged on 07/10/2017 where she presented with slurred speech and right facial droop Workup at that time included acute small infarcts in the left hemisphere.  The patient was discharged home. Family reports that she continued to have slurred speech and a mild right facial droop, in the morning on 07/11/2017 she had noticeable worsening speech as well as new onset right facial and right upper extremity numbness. These are symptoms prompted reevaluation at [facility] emerged department.  [name] daughter reports that the patient lives in [facility] [place] and lives with her. She is a former smoker. She had her first CVA 3 years ago, family denies any residual deficits. The patient formerly worked in a daycare setting.  ED Course: emergency department systolic blood pressure ranges from 150s to 203, normal heart rate, normal respiratory rate, maintaining O2 sats on room air.  Diagnostics obtained include a CBC with hemoglobin of 9.5, BMP with creatinine 1.1, BUN 24.  An MRI was obtained which revealed a few small volume acute ischemic infarcts involving the parasagittal cortical and subcortical/periventricular left frontal lobe which are new compared to most recent MRI on 07/09/2017. Emergency medicine team discussed the case with neurology consult team who will  see the patient.  Hospital medicine consulted for further management.  Review of Systems: A complete ROS was unable to obtain due to the patient's partial aphasia.  Past Medical History:  Diagnosis Date  . Diabetes mellitus without complication (HCC)   . Headache   . Hyperlipidemia   . Hypertension   . Stroke (HCC)   . TIA (transient ischemic attack)    Social History   Socioeconomic History  . Marital status: Married    Spouse name: Not on file  . Number of children: Not on file  . Years of education: Not on file  . Highest education level: Not on file  Occupational History  . Occupation: Data processing manager  Social Needs  . Financial resource strain: Not on file  . Food insecurity:    Worry: Not on file    Inability: Not on file  . Transportation needs:    Medical: Not on file    Non-medical: Not on file  Tobacco Use  . Smoking status: Never Smoker  . Smokeless tobacco: Never Used  Substance and Sexual Activity  . Alcohol use: No    Alcohol/week: 0.0 oz    Comment: rare   . Drug use: No  . Sexual activity: Not Currently  Lifestyle  . Physical activity:    Days per week: Not on file    Minutes per session: Not on file  . Stress: Not on file  Relationships  . Social connections:    Talks on phone: Not on file    Gets together: Not on file    Attends religious service: Not on file    Active member of club or organization: Not on file    Attends meetings of clubs or organizations:  Not on file    Relationship status: Not on file  . Intimate partner violence:    Fear of current or ex partner: Not on file    Emotionally abused: Not on file    Physically abused: Not on file    Forced sexual activity: Not on file  Other Topics Concern  . Not on file  Social History Narrative  . Not on file   Family History  Problem Relation Age of Onset  . Stroke Father        76s  . Heart attack Father 75  . COPD Mother   . Heart failure Brother   . Cancer Maternal  Grandmother        unknown   . COPD Unknown   . Heart failure Maternal Grandfather   . Hypertension Maternal Grandfather   . Cancer Paternal Grandfather        lung  . Diabetes Paternal Grandmother     Physical Exam: Vitals:   07/11/17 2000 07/11/17 2030 07/11/17 2100 07/12/17 0000  BP: (!) 191/76 (!) 195/73 (!) 203/66 (!) 155/64  Pulse: 81 81 80 80  Resp: Temp:      TempSrc:      SpO2: 99% 100% 100% 94%   General: Appears calm and comfortable, obese, white woman. No acute distress. ENT: Grossly normal hearing, dry mucus membranes  Cardiovascular: RRR. No M/R/G. No LE edema.  Respiratory: CTA bilaterally. No wheezes or crackles. Normal respiratory effort.  Breathing room air. Abdomen: Soft, non-tender.  Skin: No rash or induration seen on limited exam. Tattoos present. Musculoskeletal: Grossly normal tone BUE/BLE. Appropriate ROM.  Psychiatric: Flat affect. Neurologic: Appears sleepy.  Resting right facial droop present, slurred speech present.  Answers simple questions.  Pupils bilaterally round and reactive to light.    I have personally reviewed the following labs, culture data, and imaging studies.  Assessment/Plan:  #CVA, acute, ischemic Course: MRI from 07/11/2017 revealed new areas of infarct compared to prior MRI on 07/09/2017.  Clinically, patient has persistent right facial droop and difficulty speaking; however, worsening expressive aphasia and numbness of RUE. Assessment: uncertain if this is evolution vs new infarct, based on clinical tempo and MRI read - favor new infarct, work up during prior hospitalization included a TTE that was incomplete due to a panic attack Plan: Will admit to tele floor, procure TTE and repeat imaging of vasculature of neck. Follow up neurology consult recommendations.  Will await to advance diet until after deemed safe to swallow.  Once able to take PO, likely resume plavix + ASA, as well as optimizing other risk factors (HLD,  HTN, DM).  May be candidate for prolonged heart rhythm monitoring device.  #Other problems -Anemia: hb of 9.5, obtain iron studies + retic count to further elucidate -Depakote as home med: patient and daughter unable to tell me why on this medication at this time, no hx of seizure disorder, hold for now and continue to re-evaluate -HTN: holding ACEi currently, re-start when cleared for PO -DM: last A1c of 7.3%; uses 40 units of basal insulin daily; will start at 20 units while NPO with correctiion factor -HLD: re-start statin when able  DVT prophylaxis: Subq Lovenox Code Status: full code Disposition Plan: TBD. Consults called: neurology consulted by emergency medicine team. Anticipate PT/OT consults. Admission status: admit to telemetry level of care, hospital medicine    Laurell Roof, MD [facility] [phone]  If 7PM-7AM, please contact night-coverage [email or web address] Password [email or web address]

## 2017-07-13 ENCOUNTER — Other Ambulatory Visit: Payer: Self-pay

## 2017-07-13 ENCOUNTER — Encounter (HOSPITAL_COMMUNITY): Payer: Medicaid Other

## 2017-07-13 ENCOUNTER — Inpatient Hospital Stay (HOSPITAL_COMMUNITY)
Admission: RE | Admit: 2017-07-13 | Discharge: 2017-08-06 | DRG: 057 | Disposition: A | Discharge status: Home Health | Payer: Medicaid Other | Source: Intra-hospital | Attending: Physical Medicine & Rehabilitation | Admitting: Physical Medicine & Rehabilitation

## 2017-07-13 ENCOUNTER — Encounter (HOSPITAL_COMMUNITY)
Admission: EM | Disposition: A | Discharge status: Rehabilitation Facility | Payer: Self-pay | Source: Home / Self Care | Attending: Internal Medicine

## 2017-07-13 ENCOUNTER — Encounter (HOSPITAL_COMMUNITY): Payer: Self-pay | Admitting: *Deleted

## 2017-07-13 DIAGNOSIS — Z90710 Acquired absence of both cervix and uterus: Secondary | ICD-10-CM

## 2017-07-13 DIAGNOSIS — E46 Unspecified protein-calorie malnutrition: Secondary | ICD-10-CM | POA: Diagnosis present

## 2017-07-13 DIAGNOSIS — I10 Essential (primary) hypertension: Secondary | ICD-10-CM

## 2017-07-13 DIAGNOSIS — I69315 Cognitive social or emotional deficit following cerebral infarction: Secondary | ICD-10-CM | POA: Diagnosis not present

## 2017-07-13 DIAGNOSIS — G8191 Hemiplegia, unspecified affecting right dominant side: Secondary | ICD-10-CM

## 2017-07-13 DIAGNOSIS — Z79899 Other long term (current) drug therapy: Secondary | ICD-10-CM | POA: Diagnosis not present

## 2017-07-13 DIAGNOSIS — I69320 Aphasia following cerebral infarction: Secondary | ICD-10-CM | POA: Diagnosis not present

## 2017-07-13 DIAGNOSIS — I69391 Dysphagia following cerebral infarction: Secondary | ICD-10-CM | POA: Diagnosis not present

## 2017-07-13 DIAGNOSIS — R55 Syncope and collapse: Secondary | ICD-10-CM | POA: Diagnosis not present

## 2017-07-13 DIAGNOSIS — E1142 Type 2 diabetes mellitus with diabetic polyneuropathy: Secondary | ICD-10-CM | POA: Diagnosis present

## 2017-07-13 DIAGNOSIS — I639 Cerebral infarction, unspecified: Principal | ICD-10-CM

## 2017-07-13 DIAGNOSIS — Z823 Family history of stroke: Secondary | ICD-10-CM | POA: Diagnosis not present

## 2017-07-13 DIAGNOSIS — I69398 Other sequelae of cerebral infarction: Secondary | ICD-10-CM | POA: Diagnosis not present

## 2017-07-13 DIAGNOSIS — I69351 Hemiplegia and hemiparesis following cerebral infarction affecting right dominant side: Secondary | ICD-10-CM | POA: Diagnosis present

## 2017-07-13 DIAGNOSIS — E8770 Fluid overload, unspecified: Secondary | ICD-10-CM | POA: Diagnosis not present

## 2017-07-13 DIAGNOSIS — I69322 Dysarthria following cerebral infarction: Secondary | ICD-10-CM | POA: Diagnosis not present

## 2017-07-13 DIAGNOSIS — E1140 Type 2 diabetes mellitus with diabetic neuropathy, unspecified: Secondary | ICD-10-CM | POA: Diagnosis present

## 2017-07-13 DIAGNOSIS — Z8249 Family history of ischemic heart disease and other diseases of the circulatory system: Secondary | ICD-10-CM | POA: Diagnosis not present

## 2017-07-13 DIAGNOSIS — I69392 Facial weakness following cerebral infarction: Secondary | ICD-10-CM

## 2017-07-13 DIAGNOSIS — E785 Hyperlipidemia, unspecified: Secondary | ICD-10-CM

## 2017-07-13 DIAGNOSIS — R4586 Emotional lability: Secondary | ICD-10-CM | POA: Diagnosis not present

## 2017-07-13 DIAGNOSIS — E669 Obesity, unspecified: Secondary | ICD-10-CM | POA: Diagnosis present

## 2017-07-13 DIAGNOSIS — Z9114 Patient's other noncompliance with medication regimen: Secondary | ICD-10-CM | POA: Diagnosis not present

## 2017-07-13 DIAGNOSIS — I69328 Other speech and language deficits following cerebral infarction: Secondary | ICD-10-CM

## 2017-07-13 DIAGNOSIS — E869 Volume depletion, unspecified: Secondary | ICD-10-CM | POA: Diagnosis not present

## 2017-07-13 DIAGNOSIS — R1310 Dysphagia, unspecified: Secondary | ICD-10-CM | POA: Diagnosis present

## 2017-07-13 DIAGNOSIS — D62 Acute posthemorrhagic anemia: Secondary | ICD-10-CM

## 2017-07-13 DIAGNOSIS — E8809 Other disorders of plasma-protein metabolism, not elsewhere classified: Secondary | ICD-10-CM

## 2017-07-13 DIAGNOSIS — Z7982 Long term (current) use of aspirin: Secondary | ICD-10-CM | POA: Diagnosis not present

## 2017-07-13 DIAGNOSIS — F410 Panic disorder [episodic paroxysmal anxiety] without agoraphobia: Secondary | ICD-10-CM | POA: Diagnosis not present

## 2017-07-13 DIAGNOSIS — I6339 Cerebral infarction due to thrombosis of other cerebral artery: Secondary | ICD-10-CM | POA: Diagnosis not present

## 2017-07-13 DIAGNOSIS — I6930 Unspecified sequelae of cerebral infarction: Secondary | ICD-10-CM

## 2017-07-13 DIAGNOSIS — R069 Unspecified abnormalities of breathing: Secondary | ICD-10-CM

## 2017-07-13 DIAGNOSIS — Z6835 Body mass index (BMI) 35.0-35.9, adult: Secondary | ICD-10-CM

## 2017-07-13 DIAGNOSIS — R109 Unspecified abdominal pain: Secondary | ICD-10-CM

## 2017-07-13 DIAGNOSIS — I951 Orthostatic hypotension: Secondary | ICD-10-CM

## 2017-07-13 DIAGNOSIS — E1165 Type 2 diabetes mellitus with hyperglycemia: Secondary | ICD-10-CM | POA: Diagnosis present

## 2017-07-13 DIAGNOSIS — Z794 Long term (current) use of insulin: Secondary | ICD-10-CM

## 2017-07-13 DIAGNOSIS — R1312 Dysphagia, oropharyngeal phase: Secondary | ICD-10-CM | POA: Diagnosis not present

## 2017-07-13 DIAGNOSIS — E1169 Type 2 diabetes mellitus with other specified complication: Secondary | ICD-10-CM

## 2017-07-13 DIAGNOSIS — R569 Unspecified convulsions: Secondary | ICD-10-CM | POA: Diagnosis not present

## 2017-07-13 DIAGNOSIS — E639 Nutritional deficiency, unspecified: Secondary | ICD-10-CM

## 2017-07-13 DIAGNOSIS — Z7902 Long term (current) use of antithrombotics/antiplatelets: Secondary | ICD-10-CM

## 2017-07-13 DIAGNOSIS — I6300 Cerebral infarction due to thrombosis of unspecified precerebral artery: Secondary | ICD-10-CM | POA: Diagnosis not present

## 2017-07-13 DIAGNOSIS — I6389 Other cerebral infarction: Secondary | ICD-10-CM

## 2017-07-13 DIAGNOSIS — I6330 Cerebral infarction due to thrombosis of unspecified cerebral artery: Secondary | ICD-10-CM | POA: Diagnosis present

## 2017-07-13 DIAGNOSIS — Z833 Family history of diabetes mellitus: Secondary | ICD-10-CM | POA: Diagnosis not present

## 2017-07-13 DIAGNOSIS — Z8673 Personal history of transient ischemic attack (TIA), and cerebral infarction without residual deficits: Secondary | ICD-10-CM

## 2017-07-13 HISTORY — PX: LOOP RECORDER INSERTION: EP1214

## 2017-07-13 LAB — GLUCOSE, CAPILLARY
GLUCOSE-CAPILLARY: 119 mg/dL — AB (ref 65–99)
GLUCOSE-CAPILLARY: 123 mg/dL — AB (ref 65–99)
GLUCOSE-CAPILLARY: 137 mg/dL — AB (ref 65–99)

## 2017-07-13 LAB — BASIC METABOLIC PANEL
Anion gap: 10 (ref 5–15)
BUN: 13 mg/dL (ref 6–20)
CALCIUM: 9.4 mg/dL (ref 8.9–10.3)
CO2: 26 mmol/L (ref 22–32)
CREATININE: 0.97 mg/dL (ref 0.44–1.00)
Chloride: 107 mmol/L (ref 101–111)
Glucose, Bld: 128 mg/dL — ABNORMAL HIGH (ref 65–99)
Potassium: 3.6 mmol/L (ref 3.5–5.1)
SODIUM: 143 mmol/L (ref 135–145)

## 2017-07-13 LAB — HIV ANTIBODY (ROUTINE TESTING W REFLEX): HIV SCREEN 4TH GENERATION: NONREACTIVE

## 2017-07-13 LAB — CBC
HCT: 28.9 % — ABNORMAL LOW (ref 36.0–46.0)
Hemoglobin: 9.1 g/dL — ABNORMAL LOW (ref 12.0–15.0)
MCH: 28.4 pg (ref 26.0–34.0)
MCHC: 31.5 g/dL (ref 30.0–36.0)
MCV: 90.3 fL (ref 78.0–100.0)
PLATELETS: 233 10*3/uL (ref 150–400)
RBC: 3.2 MIL/uL — ABNORMAL LOW (ref 3.87–5.11)
RDW: 13.9 % (ref 11.5–15.5)
WBC: 4 10*3/uL (ref 4.0–10.5)

## 2017-07-13 SURGERY — LOOP RECORDER INSERTION

## 2017-07-13 MED ORDER — ACETAMINOPHEN 325 MG PO TABS
325.0000 mg | ORAL_TABLET | ORAL | Status: DC | PRN
  Administered 2017-07-31 – 2017-08-05 (×13): 650 mg via ORAL
  Filled 2017-07-13 (×14): qty 2

## 2017-07-13 MED ORDER — LIDOCAINE-EPINEPHRINE 1 %-1:100000 IJ SOLN
INTRAMUSCULAR | Status: AC
  Filled 2017-07-13: qty 1

## 2017-07-13 MED ORDER — CLOPIDOGREL BISULFATE 75 MG PO TABS
75.0000 mg | ORAL_TABLET | Freq: Every day | ORAL | Status: DC
  Administered 2017-07-14 – 2017-07-24 (×11): 75 mg via ORAL
  Filled 2017-07-13 (×11): qty 1

## 2017-07-13 MED ORDER — LISINOPRIL 20 MG PO TABS
20.0000 mg | ORAL_TABLET | Freq: Every day | ORAL | Status: DC
  Administered 2017-07-13 – 2017-07-19 (×7): 20 mg via ORAL
  Filled 2017-07-13 (×6): qty 1

## 2017-07-13 MED ORDER — GUAIFENESIN-DM 100-10 MG/5ML PO SYRP
5.0000 mL | ORAL_SOLUTION | Freq: Four times a day (QID) | ORAL | Status: DC | PRN
Start: 2017-07-13 — End: 2017-08-06

## 2017-07-13 MED ORDER — ROSUVASTATIN CALCIUM 40 MG PO TABS
40.0000 mg | ORAL_TABLET | Freq: Every day | ORAL | Status: DC
  Administered 2017-07-13 – 2017-08-05 (×21): 40 mg via ORAL
  Filled 2017-07-13 (×23): qty 1

## 2017-07-13 MED ORDER — BISACODYL 10 MG RE SUPP
10.0000 mg | Freq: Every day | RECTAL | Status: DC | PRN
  Administered 2017-07-24 – 2017-08-02 (×2): 10 mg via RECTAL
  Filled 2017-07-13 (×4): qty 1

## 2017-07-13 MED ORDER — LIDOCAINE-EPINEPHRINE 1 %-1:100000 IJ SOLN
INTRAMUSCULAR | Status: DC | PRN
  Administered 2017-07-13: 10 mL via INTRADERMAL

## 2017-07-13 MED ORDER — DIPHENHYDRAMINE HCL 12.5 MG/5ML PO ELIX: 12.5000 mg | ORAL_SOLUTION | Freq: Four times a day (QID) | ORAL | Status: DC | PRN

## 2017-07-13 MED ORDER — ALUM & MAG HYDROXIDE-SIMETH 200-200-20 MG/5ML PO SUSP: 30.0000 mL | ORAL | Status: DC | PRN

## 2017-07-13 MED ORDER — DIVALPROEX SODIUM 500 MG PO DR TAB
500.0000 mg | DELAYED_RELEASE_TABLET | Freq: Two times a day (BID) | ORAL | Status: DC
  Administered 2017-07-13 – 2017-07-15 (×4): 500 mg via ORAL
  Filled 2017-07-13 (×6): qty 1

## 2017-07-13 MED ORDER — INSULIN ASPART 100 UNIT/ML ~~LOC~~ SOLN
0.0000 [IU] | Freq: Three times a day (TID) | SUBCUTANEOUS | Status: DC
  Administered 2017-07-13 – 2017-07-21 (×10): 2 [IU] via SUBCUTANEOUS
  Administered 2017-07-22 – 2017-07-23 (×2): 3 [IU] via SUBCUTANEOUS
  Administered 2017-07-23: 2 [IU] via SUBCUTANEOUS
  Administered 2017-07-23: 3 [IU] via SUBCUTANEOUS
  Administered 2017-07-24: 2 [IU] via SUBCUTANEOUS
  Administered 2017-07-25: 3 [IU] via SUBCUTANEOUS
  Administered 2017-07-25 – 2017-07-26 (×2): 2 [IU] via SUBCUTANEOUS
  Administered 2017-07-26: 3 [IU] via SUBCUTANEOUS
  Administered 2017-07-26 – 2017-07-27 (×2): 2 [IU] via SUBCUTANEOUS
  Administered 2017-07-27 – 2017-07-28 (×2): 3 [IU] via SUBCUTANEOUS
  Administered 2017-07-28: 5 [IU] via SUBCUTANEOUS
  Administered 2017-07-29 (×4): 2 [IU] via SUBCUTANEOUS
  Administered 2017-07-30: 3 [IU] via SUBCUTANEOUS
  Administered 2017-07-31: 2 [IU] via SUBCUTANEOUS
  Administered 2017-07-31 (×3): 3 [IU] via SUBCUTANEOUS
  Administered 2017-08-01 (×4): 2 [IU] via SUBCUTANEOUS
  Administered 2017-08-02: 3 [IU] via SUBCUTANEOUS
  Administered 2017-08-02: 2 [IU] via SUBCUTANEOUS
  Administered 2017-08-02: 3 [IU] via SUBCUTANEOUS
  Administered 2017-08-02 – 2017-08-03 (×2): 2 [IU] via SUBCUTANEOUS
  Administered 2017-08-03: 5 [IU] via SUBCUTANEOUS
  Administered 2017-08-03 – 2017-08-04 (×2): 2 [IU] via SUBCUTANEOUS
  Administered 2017-08-04 (×2): 3 [IU] via SUBCUTANEOUS
  Administered 2017-08-05 (×2): 5 [IU] via SUBCUTANEOUS
  Administered 2017-08-06: 3 [IU] via SUBCUTANEOUS

## 2017-07-13 MED ORDER — FLEET ENEMA 7-19 GM/118ML RE ENEM: 1.0000 | ENEMA | Freq: Once | RECTAL | Status: DC | PRN

## 2017-07-13 MED ORDER — INSULIN DETEMIR 100 UNIT/ML ~~LOC~~ SOLN
20.0000 [IU] | Freq: Every day | SUBCUTANEOUS | Status: DC
  Administered 2017-07-14 – 2017-08-06 (×17): 20 [IU] via SUBCUTANEOUS
  Filled 2017-07-13 (×24): qty 0.2

## 2017-07-13 MED ORDER — PROCHLORPERAZINE MALEATE 5 MG PO TABS: 5.0000 mg | ORAL_TABLET | Freq: Four times a day (QID) | ORAL | Status: DC | PRN

## 2017-07-13 MED ORDER — PROCHLORPERAZINE EDISYLATE 10 MG/2ML IJ SOLN: 5.0000 mg | Freq: Four times a day (QID) | INTRAMUSCULAR | Status: DC | PRN

## 2017-07-13 MED ORDER — POLYETHYLENE GLYCOL 3350 17 G PO PACK
17.0000 g | PACK | Freq: Every day | ORAL | Status: DC | PRN
  Administered 2017-08-01 – 2017-08-05 (×2): 17 g via ORAL
  Filled 2017-07-13 (×3): qty 1

## 2017-07-13 MED ORDER — PROCHLORPERAZINE 25 MG RE SUPP: 12.5000 mg | Freq: Four times a day (QID) | RECTAL | Status: DC | PRN

## 2017-07-13 MED ORDER — ASPIRIN EC 81 MG PO TBEC
81.0000 mg | DELAYED_RELEASE_TABLET | Freq: Every day | ORAL | Status: DC
  Administered 2017-07-14 – 2017-08-06 (×22): 81 mg via ORAL
  Filled 2017-07-13 (×23): qty 1

## 2017-07-13 MED ORDER — ENOXAPARIN SODIUM 40 MG/0.4ML ~~LOC~~ SOLN
40.0000 mg | SUBCUTANEOUS | Status: DC
  Administered 2017-07-14 – 2017-07-29 (×15): 40 mg via SUBCUTANEOUS
  Filled 2017-07-13 (×15): qty 0.4

## 2017-07-13 MED ORDER — TRAZODONE HCL 50 MG PO TABS
25.0000 mg | ORAL_TABLET | Freq: Every evening | ORAL | Status: DC | PRN
  Administered 2017-07-13 – 2017-07-15 (×3): 50 mg via ORAL
  Filled 2017-07-13 (×3): qty 1

## 2017-07-13 SURGICAL SUPPLY — 2 items
LOOP REVEAL LINQSYS (Prosthesis & Implant Heart) ×2 IMPLANT
PACK LOOP INSERTION (CUSTOM PROCEDURE TRAY) ×2 IMPLANT

## 2017-07-13 NOTE — Progress Notes (Signed)
[facility]  Patient Status: Active (receiving services up to time of hospitalization)  [facility] is providing the following services: RN, PT, OT, ST and MSW  Referred for services but readmitted to the Hospital prior to start of services.  If patient discharges after hours, please call [phone].   Kelly Romero 07/13/2017, 10:33 AM

## 2017-07-13 NOTE — Consult Note (Signed)
Physical Medicine and Rehabilitation Consult Reason for Consult: Aphasia with right-sided weakness Referring Physician: [facility]   HPI: Kelly Romero is a 52 y.o. right-handed female with history of insulin-dependent diabetes mellitus, hyperlipidemia, CVA, hypertension.  Per chart review and patient, patient lives with daughter and her family.  She states she needed assistance with all ADLs prior to admission. One level home with 4 steps to entry.  Family assistance as needed.  Patient with recent discharge from [facility] 07/10/2017 for acute infarct deep white matter on the left.  Maintained on aspirin and Plavix for CVA prophylaxis.  Carotid Dopplers at that time showed no evidence of stenosis.  Echocardiogram without PFO.  She was discharged to home.  Presented 07/12/2017 with increasing right-sided weakness facial droop and aphasia.  Urine drug screen negative.  MRI reviewed, showing left frontal lobe infarcts. Per report, few small volume new acute ischemic infarcts involving the parasagittal cortical and subcortical periventricular left frontal lobe, new relative to most recent MRI 07/09/2017.  No associated hemorrhage or mass-effect.  Normal expected interval evolution of recently identified small volume left sided infarcts.  Neurology follow-up patient currently remains on aspirin and Plavix for CVA prophylaxis.  Subtends Lovenox for DVT prophylaxis.  Dysphagia #1 thin liquid diet.  Physical therapy evaluation completed with recommendations of physical medicine rehab consult.   Review of Systems  Constitutional: Negative for chills and fever.  HENT: Negative for hearing loss.   Eyes: Negative for blurred vision and double vision.  Respiratory: Negative for cough and shortness of breath.   Cardiovascular: Negative for chest pain, palpitations and leg swelling.  Gastrointestinal: Positive for constipation. Negative for nausea.  Genitourinary: Negative for dysuria, flank pain and hematuria.    Musculoskeletal: Positive for myalgias.  Skin: Negative for rash.  Neurological: Positive for speech change, focal weakness and headaches.  All other systems reviewed and are negative.  Past Medical History:  Diagnosis Date  . Diabetes mellitus without complication (HCC)   . Headache   . Hyperlipidemia   . Hypertension   . Stroke (HCC)   . TIA (transient ischemic attack)    Past Surgical History:  Procedure Laterality Date  . APPENDECTOMY    . CHOLECYSTECTOMY    . FOOT GANGLION EXCISION    . HERNIA REPAIR    . IR GENERIC HISTORICAL  12/30/2015   IR ANGIO INTRA EXTRACRAN SEL COM CAROTID INNOMINATE BILAT MOD SED 12/30/2015 Julieanne Cotton, MD [facility]  . IR GENERIC HISTORICAL  12/30/2015   IR ANGIO VERTEBRAL SEL VERTEBRAL BILAT MOD SED 12/30/2015 Julieanne Cotton, MD [facility]  . TEE WITHOUT CARDIOVERSION N/A 04/19/2015   Procedure: TRANSESOPHAGEAL ECHOCARDIOGRAM (TEE);  Surgeon: Jake Bathe, MD;  Location: [facility] ENDOSCOPY;  Service: Cardiovascular;  Laterality: N/A;  . VAGINAL HYSTERECTOMY     Family History  Problem Relation Age of Onset  . Stroke Father        51s  . Heart attack Father 42  . COPD Mother   . Heart failure Brother   . Cancer Maternal Grandmother        unknown   . COPD Unknown   . Heart failure Maternal Grandfather   . Hypertension Maternal Grandfather   . Cancer Paternal Grandfather        lung  . Diabetes Paternal Grandmother    Social History:  reports that she has never smoked. She has never used smokeless tobacco. She reports that she does not drink alcohol or use drugs. Allergies:  Allergies  Allergen Reactions  . Erythromycin Itching  . Latex Itching   Medications Prior to Admission  Medication Sig Dispense Refill  . aspirin EC 81 MG tablet Take 1 tablet by mouth every morning.    . clopidogrel (PLAVIX) 75 MG tablet Take 1 tablet (75 mg total) by mouth daily. 30 tablet 11  . Coenzyme Q10 (CO Q-10) 200 MG CAPS Take 200 mg by  mouth every evening.    Marland Kitchen glimepiride (AMARYL) 2 MG tablet Take 2 mg by mouth daily with breakfast.    . insulin detemir (LEVEMIR) 100 UNIT/ML injection Inject 40 Units into the skin daily before breakfast.     . lisinopril (PRINIVIL,ZESTRIL) 40 MG tablet Take 1 tablet (40 mg total) by mouth daily. 30 tablet 3  . Magnesium 500 MG TABS Take 500 mg by mouth every evening.     . Riboflavin (B2) 100 MG TABS Take 200 mg by mouth every morning.    . rosuvastatin (CRESTOR) 40 MG tablet Take 1 tablet (40 mg total) by mouth at bedtime. 30 tablet 3  . divalproex (DEPAKOTE) 500 MG DR tablet Take 1 tablet (500 mg total) by mouth 2 (two) times daily. (Patient not taking: Reported on 07/11/2017) 60 tablet 3    Home: Home Living Family/patient expects to be discharged to:: Private residence Living Arrangements: Children Available Help at Discharge: Available 24 hours/day, Family Type of Home: House Home Access: Stairs to enter Secretary/administrator of Steps: 4 Entrance Stairs-Rails: Can reach both Home Layout: One level Bathroom Shower/Tub: Engineer, manufacturing systems: Standard Home Equipment: Environmental consultant - 4 wheels, Shower seat Additional Comments: pt lives with her daughter and her family. She has a lift chair at home in additon to equipment above  Functional History: Prior Function Level of Independence: Needs assistance Gait / Transfers Assistance Needed: prior to admit to [facility], pt was ambulating with rollator ADL's / Homemaking Assistance Needed: prior to admit to [facility], daughter was helping with bathing, dressing, and home mgmt Functional Status:  Mobility: Bed Mobility Overal bed mobility: Needs Assistance Bed Mobility: Supine to Sit, Sit to Supine Supine to sit: Supervision Sit to supine: Min assist General bed mobility comments: pt able to get to EOB with use of rail but no physical assist but increased time needed and pt giving max effort. Min A for LE"s into bed with return to sitting  and min A to position in center of bed Transfers Overall transfer level: Needs assistance Transfers: Sit to/from Stand, Stand Pivot Transfers Sit to Stand: Min assist Stand pivot transfers: Min assist  Lateral/Scoot Transfers: Mod assist General transfer comment: pt on gurney in ED, unsafe to attempt standing because pt's feet could not reach floor when sitting EOB. Scooted to R in sitting, required mod A for hip motion and support Ambulation/Gait General Gait Details: unable to test in current environment    ADL: ADL Overall ADL's : Needs assistance/impaired Grooming: Minimal assistance, Sitting Upper Body Bathing: Minimal assistance, Sitting Lower Body Bathing: Moderate assistance, Sit to/from stand Upper Body Dressing : Moderate assistance, Sitting Lower Body Dressing: Moderate assistance, Sit to/from stand Toilet Transfer: Stand-pivot, Minimal assistance, BSC Toileting- Clothing Manipulation and Hygiene: Maximal assistance Toileting - Clothing Manipulation Details (indicate cue type and reason): incontinent Functional mobility during ADLs: Minimal assistance, Cueing for safety, Cueing for sequencing  Cognition: Cognition Overall Cognitive Status: No family/caregiver present to determine baseline cognitive functioning Orientation Level: Oriented X4 Cognition Arousal/Alertness: Awake/alert Behavior During Therapy: Flat affect(tearful) Overall Cognitive Status: No  family/caregiver present to determine baseline cognitive functioning Area of Impairment: Following commands, Memory, Safety/judgement, Awareness, Attention, Problem solving, Orientation Orientation Level: Disoriented to, Time("August 2002") Current Attention Level: Sustained Memory: Decreased short-term memory Following Commands: Follows one step commands with increased time Safety/Judgement: Decreased awareness of safety, Decreased awareness of deficits Awareness: Emergent Problem Solving: Slow processing,  Decreased initiation, Difficulty sequencing, Requires verbal cues, Requires tactile cues General Comments: pt with very slow speech and reports several times during history that she could not remember.   Blood pressure (!) 166/63, pulse 84, temperature 98.3 F (36.8 C), temperature source Oral, resp. rate 18, SpO2 98 %. Physical Exam  Vitals reviewed. Constitutional: She appears well-developed.  52 year old obese, right-handed Caucasian female  HENT:  Mild right facial weakness  Eyes:  Pupils reactive to light  Neck: Normal range of motion. Neck supple. No thyromegaly present.  Cardiovascular: Normal rate, regular rhythm and normal heart sounds.  Respiratory: Effort normal and breath sounds normal. No respiratory distress.  GI: Soft. Bowel sounds are normal. She exhibits no distension.  Musculoskeletal:  No edema or tenderness in extremities  Neurological: She is alert.  Patient appears frustrated and tearful.   She did follow simple verbal commands.   Alert and Oriented x2 Motor: 4/5 throughout, no ataxia Dysarthira  Skin: Skin is warm and dry.  Psychiatric: Her speech is delayed and slurred. She exhibits a depressed mood.    Results for orders placed or performed during the hospital encounter of 07/11/17 (from the past 24 hour(s))  CBG monitoring, ED     Status: None   Collection Time: 07/12/17  7:10 AM  Result Value Ref Range   Glucose-Capillary 84 65 - 99 mg/dL  CBG monitoring, ED     Status: None   Collection Time: 07/12/17  9:19 AM  Result Value Ref Range   Glucose-Capillary 79 65 - 99 mg/dL  CBG monitoring, ED     Status: Abnormal   Collection Time: 07/12/17  5:05 PM  Result Value Ref Range   Glucose-Capillary 167 (H) 65 - 99 mg/dL  Glucose, capillary     Status: Abnormal   Collection Time: 07/12/17  6:53 PM  Result Value Ref Range   Glucose-Capillary 209 (H) 65 - 99 mg/dL  Iron and TIBC     Status: Abnormal   Collection Time: 07/12/17  7:05 PM  Result Value Ref  Range   Iron 42 28 - 170 ug/dL   TIBC 161 (L) 096 - 045 ug/dL   Saturation Ratios 18 10.4 - 31.8 %   UIBC 197 ug/dL  Reticulocytes     Status: Abnormal   Collection Time: 07/12/17  7:05 PM  Result Value Ref Range   Retic Ct Pct 0.7 0.4 - 3.1 %   RBC. 3.25 (L) 3.87 - 5.11 MIL/uL   Retic Count, Absolute 22.8 19.0 - 186.0 K/uL  Glucose, capillary     Status: Abnormal   Collection Time: 07/12/17  9:14 PM  Result Value Ref Range   Glucose-Capillary 113 (H) 65 - 99 mg/dL   Comment 1 Notify RN    Comment 2 Document in Chart   Glucose, capillary     Status: Abnormal   Collection Time: 07/13/17 12:50 AM  Result Value Ref Range   Glucose-Capillary 119 (H) 65 - 99 mg/dL   Comment 1 Notify RN    Comment 2 Document in Chart   CBC     Status: Abnormal   Collection Time: 07/13/17  4:26 AM  Result Value  Ref Range   WBC 4.0 4.0 - 10.5 K/uL   RBC 3.20 (L) 3.87 - 5.11 MIL/uL   Hemoglobin 9.1 (L) 12.0 - 15.0 g/dL   HCT 16.1 (L) 09.6 - 04.5 %   MCV 90.3 78.0 - 100.0 fL   MCH 28.4 26.0 - 34.0 pg   MCHC 31.5 30.0 - 36.0 g/dL   RDW 40.9 81.1 - 91.4 %   Platelets 233 150 - 400 K/uL  Basic metabolic panel     Status: Abnormal   Collection Time: 07/13/17  4:26 AM  Result Value Ref Range   Sodium 143 135 - 145 mmol/L   Potassium 3.6 3.5 - 5.1 mmol/L   Chloride 107 101 - 111 mmol/L   CO2 26 22 - 32 mmol/L   Glucose, Bld 128 (H) 65 - 99 mg/dL   BUN 13 6 - 20 mg/dL   Creatinine, Ser 7.82 0.44 - 1.00 mg/dL   Calcium 9.4 8.9 - 95.6 mg/dL   GFR calc non Af Amer >60 >60 mL/min   GFR calc Af Amer >60 >60 mL/min   Anion gap 10 5 - 15  Glucose, capillary     Status: Abnormal   Collection Time: 07/13/17  6:11 AM  Result Value Ref Range   Glucose-Capillary 123 (H) 65 - 99 mg/dL   Comment 1 Notify RN    Comment 2 Document in Chart    Mr Brain Wo Contrast (neuro Protocol)  Result Date: 07/11/2017 CLINICAL DATA:  Initial evaluation for worsening right-sided weakness with slurred speech, recent stroke.  EXAM: MRI HEAD WITHOUT CONTRAST TECHNIQUE: Multiplanar, multiecho pulse sequences of the brain and surrounding structures were obtained without intravenous contrast. COMPARISON:  Prior MRI from 07/09/2017. FINDINGS: Brain: Age-related cerebral atrophy with chronic small vessel ischemic disease again noted. Encephalomalacia with gliosis within the parasagittal left parietal lobe consistent with remote ischemic infarct. Small remote lacunar infarcts present within the bilateral basal ganglia/corona radiata as well as the left thalamus. Chronic hemorrhagic blood products present about several of these infarcts. Continued interval evolution of recently identified small ischemic infarcts involving the left periventricular white matter, relatively stable in size and distribution as compared to previous. No evidence for hemorrhagic transformation or significant mass effect. There are several small areas of new acute infarction involving the cortical gray matter and underlying periventricular white matter of the parasagittal right frontal lobe (series 3, image 34, 32). Largest area of infarct measures 12 mm. No associated hemorrhage or mass effect. No other areas of new or interval infarction. Gray-white matter differentiation otherwise maintained. No mass lesion, midline shift or mass effect. No hydrocephalus. No extra-axial fluid collection. Major dural sinuses are grossly patent. Pituitary gland suprasellar region normal. Midline structures intact. Vascular: Major intracranial vascular flow voids are maintained at the skull base. Skull and upper cervical spine: Craniocervical junction normal. Upper cervical spine normal. Bone marrow signal intensity within normal limits. Hyperostosis frontalis interna noted. No scalp soft tissue abnormality. Sinuses/Orbits: Globes and orbital soft tissues within normal limits. Patient status post lens extraction bilaterally. Mild scattered mucosal thickening within the ethmoidal air  cells and maxillary sinuses. No air-fluid level to suggest acute sinusitis. No mastoid effusion. Inner ear structures normal. Other: None. IMPRESSION: 1. Few small volume new acute ischemic infarcts involving the parasagittal cortical and subcortical/periventricular left frontal lobe, new relative to recent MRI from 07/09/2017. No associated hemorrhage or mass effect. 2. Normal expected interval evolution of recently identified small volume left-sided infarcts, relatively stable in size and distribution as  compared to previous. 3. Otherwise stable appearance of the brain with chronic atrophy and small vessel ischemic disease, with multiple scattered remote infarcts as above. Electronically Signed   By: Rise Mu M.D.   On: 07/11/2017 22:44    Assessment/Plan: Diagnosis: Acute ischemic infarcts involving the parasagittal cortical and subcortical periventricular left frontal lobe Labs and images independently reviewed.  Records reviewed and summated above. Stroke: Continue secondary stroke prophylaxis and Risk Factor Modification listed below:   Antiplatelet therapy:   Blood Pressure Management:  Continue current medication with prn's with permisive HTN per primary team Statin Agent:   Diabetes management:    1. Does the need for close, 24 hr/day medical supervision in concert with the patient's rehab needs make it unreasonable for this patient to be served in a less intensive setting? Potentially  2. Co-Morbidities requiring supervision/potential complications:  DM (Monitor in accordance with exercise and adjust meds as necessary), hyperlipidemia (cont meds), history of CVA, HTN (monitor and provide prns in accordance with increased physical exertion and pain), post-stroke dysphagia (advance diet as tolerated), ABLA (transfuse if necessary to ensure appropriate perfusion for increased activity tolerance), morbid obesity (encourage weight loss) 3. Due to safety, disease management, medication  administration and patient education, does the patient require 24 hr/day rehab nursing? Yes 4. Does the patient require coordinated care of a physician, rehab nurse, PT (1-2 hrs/day, 5 days/week), OT (1-2 hrs/day, 5 days/week) and SLP (1-2 hrs/day, 5 days/week) to address physical and functional deficits in the context of the above medical diagnosis(es)? Yes Addressing deficits in the following areas: balance, endurance, locomotion, strength, transferring, bathing, dressing, toileting, cognition, speech, swallowing and psychosocial support 5. Can the patient actively participate in an intensive therapy program of at least 3 hrs of therapy per day at least 5 days per week? Potentially 6. The potential for patient to make measurable gains while on inpatient rehab is excellent 7. Anticipated functional outcomes upon discharge from inpatient rehab are min assist  with PT, min assist with OT, min assist with SLP. 8. Estimated rehab length of stay to reach the above functional goals is: TBD. 9. Anticipated D/C setting: Home 10. Anticipated post D/C treatments: HH therapy and Home excercise program 11. Overall Rehab/Functional Prognosis: good  RECOMMENDATIONS: This patient's condition is appropriate for continued rehabilitative care in the following setting: Will await more thorough therapy evaluations.  Additionally, will need to inquire further regarding baseline level of functioning as pt states that she required assistance for all ADLs PTA.  If this is the case she is likely at/near her baseline level of functioning. Patient has agreed to participate in recommended program. Potentially Note that insurance prior authorization may be required for reimbursement for recommended care.  Comment: Rehab Admissions Coordinator to follow up.   I have personally performed a face to face diagnostic evaluation, including, but not limited to relevant history and physical exam findings, of this patient and  developed relevant assessment and plan.  Additionally, I have reviewed and concur with the physician assistant's documentation above.   Maryla Morrow, MD, ABPMR Mcarthur Rossetti Angiulli, PA-C 07/13/2017

## 2017-07-13 NOTE — Progress Notes (Signed)
Received pt. As a new admission.Pt. Oriented to rehab protocol.Safety plan was explained,fall prevention plan was signed.

## 2017-07-13 NOTE — Care Management Note (Signed)
Case Management Note  Patient Details  Name: Kelly Romero MRN: [id number] Date of Birth: [birth year]  Subjective/Objective:   Pt admitted with CVA. She if from home.                  Action/Plan: Pt discharging to CIR today. CM signing off.    Expected Discharge Date:  07/13/17               Expected Discharge Plan:  IP Rehab Facility  In-House Referral:     Discharge planning Services  CM Consult  Post Acute Care Choice:    Choice offered to:     DME Arranged:    DME Agency:     HH Arranged:    HH Agency:     Status of Service:  Completed, signed off  If discussed at [employer], dates discussed:    Additional Comments:  Kermit Balo, RN 07/13/2017, 4:36 PM

## 2017-07-13 NOTE — Progress Notes (Signed)
Pt's son in law has just left after visiting with patient this morning. I contacted him by phone to discuss patient's baseline level of fucnitoning. He states patient typically supervision level on her rollator and with adls. He and his wife provide supervision in and out of bath, but patient does her adls herself. Pt has had increase in her falls due to dizziness when up for patient does not self regulate to sit when she becomes limited by her dizziness. Patient with a decline in function over the past month. He and his wife work opposite shifts to be able to provide 24/7 supervision at home. They plan to visit around 3 pm today and I have arranged to meet them at pt's bedside at that time to further discuss rehab dispo options. [phone]
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
## 2017-07-13 NOTE — Progress Notes (Signed)
I met with pt, daughter and son in law at bedside. They prefer an inpt rehab admit rather than SNF. I will contact Dr. Nevada Crane to arrange d/c to CIR today if medical workup is complete. I will notify RN CM. [phone]

## 2017-07-13 NOTE — Discharge Summary (Signed)
Discharge Summary  Kelly Romero:[id number] DOB: Dec 27, 1965  PCP: Kelly Saupe, MD  Admit date: 07/11/2017 Discharge date: 07/13/2017  Time spent: 25 minutes  Recommendations for Outpatient Follow-up:  1. Continue PT at [facility]  Discharge Diagnoses:  Active Hospital Problems   Diagnosis Date Noted  . Diabetes mellitus type 2 in obese (HCC)   . History of CVA (cerebrovascular accident)   . Benign essential HTN   . Dysphagia, post-stroke   . Acute blood loss anemia   . Morbid obesity (HCC)   . CVA (cerebral vascular accident) (HCC) 07/08/2017    Resolved Hospital Problems  No resolved problems to display.    Discharge Condition: Stable  Diet recommendation: Resume previous diet  Vitals:   07/13/17 0832 07/13/17 1147  BP: (!) 183/68 (!) 157/68  Pulse: 83 84  Resp: 18 18  Temp: 98.2 F (36.8 C) 98.1 F (36.7 C)  SpO2: 99% 100%    History of present illness:  this is a 52 year old woman with medical problems including insulin-dependent diabetes, recent history of stroke, hyperlipidemia. previously treated with PCS K-9 inhibitor, hypertension, obesity who is presenting with worsening speech and right-sided facial and right upper extremity numbness.  History is obtained primarily through the patient's daughter due to the patient's inability to communicate due to aphasia.  Patient was admitted to [facility] on April 25 and discharged on 07/10/2017 where she presented with slurred speech and right facial droop Workup at that time included acute small infarcts in the left hemisphere.  The patient was discharged home. Family reports that she continued to have slurred speech and a mild right facial droop, in the morning on 07/11/2017 she had noticeable worsening speech as well as new onset right facial and right upper extremity numbness. These are symptoms prompted reevaluation at [facility] emerged department.  [name] daughter reports that the patient  lives in [place] and lives with her. She is a former smoker. She had her first CVA 3 years ago, family denies any residual deficits. The patient formerly worked in a daycare setting.  07/13/17: Patient was seen and  examined at her bedside.  She is verbal.  She has no new acute complaints.  She was accepted to inpatient rehab and will continue her physical rehabilitation and Occupational Therapy there.  On the day of discharge patient was hemodynamically stable.  Hospital Course:  Active Problems:   CVA (cerebral vascular accident) (HCC)   Diabetes mellitus type 2 in obese (HCC)   History of CVA (cerebrovascular accident)   Benign essential HTN   Dysphagia, post-stroke   Acute blood loss anemia   Morbid obesity (HCC)  Recurrent strokes MRI from 07/11/2017 revealed new areas of infarct compared to prior MRI on 07/09/2017.   Neurology consulted and followed and recommended continue aspirin Plavix and statin Continue PT and OT at the inpatient rehab Electrophysiology consulted for loop recorder placement  Ambulatory dysfunction post CVA Continue PT at inpatient rehab Fall precautions  Hypertension Resume home medications   Procedures:  None  Consultations: Neurology  Discharge Exam: BP (!) 157/68 (BP Location: Right Arm)   Pulse 84   Temp 98.1 F (36.7 C)   Resp 18   SpO2 100%   General: 52 year old Caucasian female well-developed well-nourished no acute distress.  Alert.  Speech is improved. Cardiovascular: Regular rate and rhythm with no rubs or gallops.  No JVD or thyromegaly. Respiratory: To auscultation with no wheezes or rales.  Good inspiratory effort.  Discharge Instructions You were cared for by a hospitalist during your hospital stay. If you have any questions about your discharge medications or the care you received while you were in the hospital after you are discharged, you can call the unit and asked to speak with the hospitalist on call if  the hospitalist that took care of you is not available. Once you are discharged, your primary care physician will handle any further medical issues. Please note that NO REFILLS for any discharge medications will be authorized once you are discharged, as it is imperative that you return to your primary care physician (or establish a relationship with a primary care physician if you do not have one) for your aftercare needs so that they can reassess your need for medications and monitor your lab values.   Allergies as of 07/13/2017      Reactions   Erythromycin Itching   Latex Itching      Medication List    STOP taking these medications   glimepiride 2 MG tablet Commonly known as:  AMARYL     TAKE these medications   aspirin EC 81 MG tablet Take 1 tablet by mouth every morning.   B2 100 MG Tabs Take 200 mg by mouth every morning.   clopidogrel 75 MG tablet Commonly known as:  PLAVIX Take 1 tablet (75 mg total) by mouth daily.   Co Q-10 200 MG Caps Take 200 mg by mouth every evening.   divalproex 500 MG DR tablet Commonly known as:  DEPAKOTE Take 1 tablet (500 mg total) by mouth 2 (two) times daily.   insulin detemir 100 UNIT/ML injection Commonly known as:  LEVEMIR Inject 40 Units into the skin daily before breakfast.   lisinopril 40 MG tablet Commonly known as:  PRINIVIL,ZESTRIL Take 1 tablet (40 mg total) by mouth daily.   Magnesium 500 MG Tabs Take 500 mg by mouth every evening.   rosuvastatin 40 MG tablet Commonly known as:  CRESTOR Take 1 tablet (40 mg total) by mouth at bedtime.      Allergies  Allergen Reactions  . Erythromycin Itching  . Latex Itching   Follow-up Information    [facility] Office Follow up on 07/29/2017.   Specialty:  Cardiology Why:  at [facility] for wound check  Contact information: [address], [address] [place] [address] [phone]       Arvin Collard II, MD Follow up in 1 week(s).     Specialty:  Family Medicine Why:  please call office for appointment. Contact information: [address] [place] [address] [phone]            The results of significant diagnostics from this hospitalization (including imaging, microbiology, ancillary and laboratory) are listed below for reference.    Significant Diagnostic Studies: Ct Head Wo Contrast  Result Date: 07/08/2017 CLINICAL DATA:  Weakness and slurred speech for 1 day EXAM: CT HEAD WITHOUT CONTRAST TECHNIQUE: Contiguous axial images were obtained from the base of the skull through the vertex without intravenous contrast. COMPARISON:  04/27/2017 FINDINGS: Brain: Old left PCA infarct is identified. Scattered lacunar infarcts are again identified and stable. No findings to suggest acute hemorrhage, acute infarction or space-occupying lesion are noted. Vascular: No hyperdense vessel or unexpected calcification. Skull: Normal. Negative for fracture or focal lesion. Sinuses/Orbits: No acute finding. Other: None. IMPRESSION: Chronic ischemic changes are noted.  No acute abnormality noted. Electronically Signed   By: Eulah Pont.D.  On: 07/08/2017 12:37   Mr Brain Wo Contrast (neuro Protocol)  Result Date: 07/11/2017 CLINICAL DATA:  Initial evaluation for worsening right-sided weakness with slurred speech, recent stroke. EXAM: MRI HEAD WITHOUT CONTRAST TECHNIQUE: Multiplanar, multiecho pulse sequences of the brain and surrounding structures were obtained without intravenous contrast. COMPARISON:  Prior MRI from 07/09/2017. FINDINGS: Brain: Age-related cerebral atrophy with chronic small vessel ischemic disease again noted. Encephalomalacia with gliosis within the parasagittal left parietal lobe consistent with remote ischemic infarct. Small remote lacunar infarcts present within the bilateral basal ganglia/corona radiata as well as the left thalamus. Chronic hemorrhagic blood products present about several of these  infarcts. Continued interval evolution of recently identified small ischemic infarcts involving the left periventricular white matter, relatively stable in size and distribution as compared to previous. No evidence for hemorrhagic transformation or significant mass effect. There are several small areas of new acute infarction involving the cortical gray matter and underlying periventricular white matter of the parasagittal right frontal lobe (series 3, image 34, 32). Largest area of infarct measures 12 mm. No associated hemorrhage or mass effect. No other areas of new or interval infarction. Gray-white matter differentiation otherwise maintained. No mass lesion, midline shift or mass effect. No hydrocephalus. No extra-axial fluid collection. Major dural sinuses are grossly patent. Pituitary gland suprasellar region normal. Midline structures intact. Vascular: Major intracranial vascular flow voids are maintained at the skull base. Skull and upper cervical spine: Craniocervical junction normal. Upper cervical spine normal. Bone marrow signal intensity within normal limits. Hyperostosis frontalis interna noted. No scalp soft tissue abnormality. Sinuses/Orbits: Globes and orbital soft tissues within normal limits. Patient status post lens extraction bilaterally. Mild scattered mucosal thickening within the ethmoidal air cells and maxillary sinuses. No air-fluid level to suggest acute sinusitis. No mastoid effusion. Inner ear structures normal. Other: None. IMPRESSION: 1. Few small volume new acute ischemic infarcts involving the parasagittal cortical and subcortical/periventricular left frontal lobe, new relative to recent MRI from 07/09/2017. No associated hemorrhage or mass effect. 2. Normal expected interval evolution of recently identified small volume left-sided infarcts, relatively stable in size and distribution as compared to previous. 3. Otherwise stable appearance of the brain with chronic atrophy and small  vessel ischemic disease, with multiple scattered remote infarcts as above. Electronically Signed   By: Rise Mu M.D.   On: 07/11/2017 22:44   Mr Brain Wo Contrast  Result Date: 07/09/2017 CLINICAL DATA:  Stroke aphasia.  Right-sided weakness EXAM: MRI HEAD WITHOUT CONTRAST MRA HEAD WITHOUT CONTRAST TECHNIQUE: Multiplanar, multiecho pulse sequences of the brain and surrounding structures were obtained without intravenous contrast. Angiographic images of the head were obtained using MRA technique without contrast. COMPARISON:  CT head one thousand nineteen FINDINGS: MRI HEAD FINDINGS Brain: Small areas of acute infarction in the left corona radiata and left frontal white matter. Chronic infarct left occipital parietal lobe. Chronic microvascular ischemic change in the white matter bilaterally. Negative for hemorrhage mass or edema. Vascular: Normal arterial flow voids Skull and upper cervical spine: Negative Sinuses/Orbits: Mucosal edema right maxillary sinus. Bilateral cataract removal Other: None MRA HEAD FINDINGS Image quality degraded by motion Moderately severe stenosis distal vertebral artery bilaterally. Mild to moderate stenosis proximal basilar. Cerebellar arteries not well seen due to motion. Severe stenosis proximal right posterior cerebral artery. Occlusion mid left posterior cerebral artery. Fetal origin left posterior cerebral artery. Atherosclerotic irregularity and mild moderate stenosis in the cavernous carotid bilaterally. Diffuse atherosclerotic disease and middle cerebral arteries bilaterally. Both anterior cerebral arteries are patent.  IMPRESSION: Small areas of acute infarct in the deep white matter on the left Moderate chronic ischemic changes in the white matter and left occipital parietal lobe. MRA degraded by motion. Diffuse intracranial atherosclerotic disease. Electronically Signed   By: Marlan Palau M.D.   On: 07/09/2017 12:31   US Carotid Bilateral (at [facility]  Only)  Result Date: 07/09/2017 CLINICAL DATA:  52 year old female with a history of right-sided cerebrovascular accident EXAM: BILATERAL CAROTID DUPLEX ULTRASOUND TECHNIQUE: Wallace Cullens scale imaging, color Doppler and duplex ultrasound were performed of bilateral carotid and vertebral arteries in the neck. COMPARISON:  Prior duplex carotid ultrasound 10/02/2016 FINDINGS: Criteria: Quantification of carotid stenosis is based on velocity parameters that correlate the residual internal carotid diameter with NASCET-based stenosis levels, using the diameter of the distal internal carotid lumen as the denominator for stenosis measurement. The following velocity measurements were obtained: RIGHT ICA: 115/30 cm/sec CCA: 86/15 cm/sec SYSTOLIC ICA/CCA RATIO:  1.0 ECA:  93 cm/sec LEFT ICA: 98/30 cm/sec CCA: 86/16 cm/sec SYSTOLIC ICA/CCA RATIO:  1.1 ECA:  122 cm/sec RIGHT CAROTID ARTERY: Moderate heterogeneous and slightly irregular atherosclerotic plaque in the proximal internal carotid artery. By peak systolic velocity criteria, the estimated stenosis remains less than 50%. RIGHT VERTEBRAL ARTERY:  Patent with normal antegrade flow. LEFT CAROTID ARTERY: Trace heterogeneous and relatively smooth atherosclerotic plaque in the proximal internal carotid artery. By peak systolic velocity criteria, the estimated stenosis remains less than 50%. LEFT VERTEBRAL ARTERY:  Patent with normal antegrade flow. IMPRESSION: 1. Mild (1-49%) stenosis proximal right internal carotid artery secondary to moderate heterogeneous and slightly irregular atherosclerotic plaque. 2. Mild (1-49%) stenosis proximal left internal carotid artery secondary to minimal heterogeneous but smooth atherosclerotic plaque. 3. Vertebral arteries are patent with normal antegrade flow. Signed, Sterling Big, MD [facility] Electronically Signed   By: Malachy Moan M.D.   On: 07/09/2017 09:05   Mr Maxine Glenn  Head/brain ZO Cm  Result Date: 07/09/2017 CLINICAL DATA:  Stroke aphasia.  Right-sided weakness EXAM: MRI HEAD WITHOUT CONTRAST MRA HEAD WITHOUT CONTRAST TECHNIQUE: Multiplanar, multiecho pulse sequences of the brain and surrounding structures were obtained without intravenous contrast. Angiographic images of the head were obtained using MRA technique without contrast. COMPARISON:  CT head one thousand nineteen FINDINGS: MRI HEAD FINDINGS Brain: Small areas of acute infarction in the left corona radiata and left frontal white matter. Chronic infarct left occipital parietal lobe. Chronic microvascular ischemic change in the white matter bilaterally. Negative for hemorrhage mass or edema. Vascular: Normal arterial flow voids Skull and upper cervical spine: Negative Sinuses/Orbits: Mucosal edema right maxillary sinus. Bilateral cataract removal Other: None MRA HEAD FINDINGS Image quality degraded by motion Moderately severe stenosis distal vertebral artery bilaterally. Mild to moderate stenosis proximal basilar. Cerebellar arteries not well seen due to motion. Severe stenosis proximal right posterior cerebral artery. Occlusion mid left posterior cerebral artery. Fetal origin left posterior cerebral artery. Atherosclerotic irregularity and mild moderate stenosis in the cavernous carotid bilaterally. Diffuse atherosclerotic disease and middle cerebral arteries bilaterally. Both anterior cerebral arteries are patent. IMPRESSION: Small areas of acute infarct in the deep white matter on the left Moderate chronic ischemic changes in the white matter and left occipital parietal lobe. MRA degraded by motion. Diffuse intracranial atherosclerotic disease. Electronically Signed   By: Marlan Palau M.D.   On: 07/09/2017 12:31    Microbiology: No results found for this or any previous visit (from the past 240 hour(s)).   Labs: Basic Metabolic  Panel: Recent Labs  Lab 07/08/17 1224 07/11/17 1717 07/11/17 1727  07/13/17 0426  NA 139 142 143 143  K 4.2 4.2 4.2 3.6  CL 106 108 109 107  CO2 28 27  --  26  GLUCOSE 175* 145* 143* 128*  BUN 24* 24* 24* 13  CREATININE 1.23* 1.12* 1.10* 0.97  CALCIUM 9.1 9.7  --  9.4   Liver Function Tests: Recent Labs  Lab 07/08/17 1224 07/11/17 1717  AST 20 16  ALT 12* 11*  ALKPHOS 77 66  BILITOT 0.5 0.6  PROT 6.9 6.7  ALBUMIN 3.4* 2.8*   No results for input(s): LIPASE, AMYLASE in the last 168 hours. No results for input(s): AMMONIA in the last 168 hours. CBC: Recent Labs  Lab 07/08/17 1224 07/11/17 1717 07/11/17 1727 07/13/17 0426  WBC 5.3 4.8  --  4.0  NEUTROABS 3.6 3.0  --   --   HGB 10.2* 9.5* 9.2* 9.1*  HCT 31.0* 30.1* 27.0* 28.9*  MCV 87.4 90.1  --  90.3  PLT 276 259  --  233   Cardiac Enzymes: Recent Labs  Lab 07/08/17 1224  TROPONINI <0.03   BNP: BNP (last 3 results) No results for input(s): BNP in the last 8760 hours.  ProBNP (last 3 results) No results for input(s): PROBNP in the last 8760 hours.  CBG: Recent Labs  Lab 07/12/17 1705 07/12/17 1853 07/12/17 2114 07/13/17 0050 07/13/17 0611  GLUCAP 167* 209* 113* 119* 123*       Signed:  Darlin Drop, MD [facility] 07/13/2017, 4:26 PM

## 2017-07-13 NOTE — H&P (Signed)
Physical Medicine and Rehabilitation Admission H&P     Chief Complaint  Patient presents with  . Cerebrovascular Accident with functional deficits.   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 She had worsening of slurred speech and right sided weakness and was admitted to [facility] on 07/11/17 for work up. MRI brain repeated and showed few new strokes in parasagittal cortical and subcortical left frontal lobe. Patient with negative work up for vasculitis, hypercoagulable state as well as spinal tap. Dr. Pearlean Brownie felt that storke due to advanced intracranial atherosclerosis and recommended DAPT for 3 months. Loop recorder placed by Dr. Johney Frame to rule out A fib. Patient with right sided weakness with cognitive deficits, lability, lethargy affecting ability to carry out ADLs and mobility. CIR recommended due to functional deficits. and difficulty with  Review of Systems  Constitutional: Negative for fever.  HENT: Negative for hearing loss.  Eyes: Negative for pain.  Respiratory: Negative for cough.  Cardiovascular: Negative for chest pain.  Gastrointestinal: Negative for heartburn.  Genitourinary: Negative for dysuria.  Musculoskeletal: Negative for myalgias.  Skin: Negative for rash.  Neurological: Positive for sensory change, speech change and focal weakness.  Psychiatric/Behavioral: Negative for depression and substance abuse.       Past Medical History:  Diagnosis Date  . Diabetes mellitus without complication (HCC)   . Headache   . Hyperlipidemia   . Hypertension   . Stroke (HCC)   . TIA (transient ischemic attack)         Past Surgical History:  Procedure Laterality Date  . APPENDECTOMY    . CHOLECYSTECTOMY    . FOOT GANGLION EXCISION    . HERNIA REPAIR    . IR GENERIC HISTORICAL  12/30/2015   IR  ANGIO INTRA EXTRACRAN SEL COM CAROTID INNOMINATE BILAT MOD SED 12/30/2015 Julieanne Cotton, MD [facility]-INTERV RAD  . IR GENERIC HISTORICAL  12/30/2015   IR ANGIO VERTEBRAL SEL VERTEBRAL BILAT MOD SED 12/30/2015 Julieanne Cotton, MD [facility]  . TEE WITHOUT CARDIOVERSION N/A 04/19/2015   Procedure: TRANSESOPHAGEAL ECHOCARDIOGRAM (TEE); Surgeon: Jake Bathe, MD; Location[facility] ENDOSCOPY; Service: Cardiovascular; Laterality: N/A;  . VAGINAL HYSTERECTOMY          Family History  Problem Relation Age of Onset  . Stroke Father    22s  . Heart attack Father 75  . COPD Mother   . Heart failure Brother   . Cancer Maternal Grandmother    unknown   . COPD Unknown   . Heart failure Maternal Grandfather   . Hypertension Maternal Grandfather   . Cancer Paternal Grandfather    lung  . Diabetes Paternal Grandmother    Social History: reports that she has never smoked. She has never used smokeless tobacco. She reports that she does not drink alcohol or use drugs.      Allergies  Allergen Reactions  . Erythromycin Itching  . Latex Itching         Medications Prior to Admission  Medication Sig Dispense Refill  . aspirin EC 81 MG tablet Take 1 tablet by mouth every morning.    . clopidogrel (PLAVIX) 75 MG tablet Take 1 tablet (75 mg total) by mouth daily. 30 tablet 11  . Coenzyme Q10 (CO Q-10) 200 MG CAPS Take 200 mg by mouth every evening.    Marland Kitchen glimepiride (AMARYL) 2 MG tablet  Take 2 mg by mouth daily with breakfast.    . insulin detemir (LEVEMIR) 100 UNIT/ML injection Inject 40 Units into the skin daily before breakfast.     . lisinopril (PRINIVIL,ZESTRIL) 40 MG tablet Take 1 tablet (40 mg total) by mouth daily. 30 tablet 3  . Magnesium 500 MG TABS Take 500 mg by mouth every evening.     . Riboflavin (B2) 100 MG TABS Take 200 mg by mouth every morning.    . rosuvastatin (CRESTOR) 40 MG tablet Take 1 tablet (40 mg total) by mouth at bedtime. 30 tablet 3  . divalproex (DEPAKOTE) 500 MG DR  tablet Take 1 tablet (500 mg total) by mouth 2 (two) times daily. (Patient not taking: Reported on 07/11/2017) 60 tablet 3   Drug Regimen Review  Drug regimen was reviewed and remains appropriate with no significant issues identified  Home:  Home Living  Family/patient expects to be discharged to:: Private residence  Living Arrangements: Children  Available Help at Discharge: Available 24 hours/day, Family  Type of Home: House  Home Access: Stairs to enter  Secretary/administrator of Steps: 4  Entrance Stairs-Rails: Can reach both  Home Layout: One level  Bathroom Shower/Tub: Medical sales representative: Standard  Home Equipment: Environmental consultant - 4 wheels, Shower seat  Additional Comments: pt lives with her daughter and her family. She has a lift chair at home in additon to equipment above  Functional History:  Prior Function  Level of Independence: Needs assistance  Gait / Transfers Assistance Needed: prior to admit to [facility], pt was ambulating with rollator  ADL's / Homemaking Assistance Needed: prior to admit to [facility], daughter was helping with bathing, dressing, and home mgmt  Functional Status:  Mobility:  Bed Mobility  Overal bed mobility: Needs Assistance  Bed Mobility: Supine to Sit  Supine to sit: Supervision  Sit to supine: Min assist  General bed mobility comments: supervision for safety  Transfers  Overall transfer level: Needs assistance  Equipment used: Rolling walker (2 wheeled)  Transfers: Sit to/from Stand  Sit to Stand: Min assist  Stand pivot transfers: Min assist  Lateral/Scoot Transfers: Mod assist  General transfer comment: assist to steady; increased time and effort; cues for safety  Ambulation/Gait  Ambulation/Gait assistance: Min assist  Ambulation Distance (Feet): 75 Feet  Assistive device: Rolling walker (2 wheeled)  Gait Pattern/deviations: Step-through pattern, Decreased step length - right, Decreased step length - left, Trunk flexed, Drifts right/left    General Gait Details: cues for forward gaze, posture, and safe use of AD; pt with tendency to drift R/L and unaware of surroundings with assist and directional cues needed for navigating environment; pt fatigued quickly and attempting to lean on RW to rest   ADL:  ADL  Overall ADL's : Needs assistance/impaired  Grooming: Minimal assistance, Sitting  Upper Body Bathing: Minimal assistance, Sitting  Lower Body Bathing: Moderate assistance, Sit to/from stand  Upper Body Dressing : Moderate assistance, Sitting  Lower Body Dressing: Moderate assistance, Sit to/from stand  Toilet Transfer: Stand-pivot, Minimal assistance, BSC  Toileting- Clothing Manipulation and Hygiene: Maximal assistance  Toileting - Clothing Manipulation Details (indicate cue type and reason): incontinent  Functional mobility during ADLs: Minimal assistance, Cueing for safety, Cueing for sequencing  Cognition:  Cognition  Overall Cognitive Status: (son in law present but still not positive of baseline)  Orientation Level: Oriented X4  Cognition  Arousal/Alertness: Awake/alert  Behavior During Therapy: Flat affect  Overall Cognitive Status: (son in law present but  still not positive of baseline)  Area of Impairment: Following commands, Memory, Safety/judgement, Awareness, Attention, Problem solving  Orientation Level: Disoriented to, Time("August 2002")  Current Attention Level: Sustained  Memory: Decreased short-term memory  Following Commands: Follows one step commands with increased time  Safety/Judgement: Decreased awareness of safety, Decreased awareness of deficits  Awareness: Emergent  Problem Solving: Slow processing, Decreased initiation, Difficulty sequencing, Requires verbal cues, Requires tactile cues  General Comments: pt with decreased awareness of surroundings and difficulty navigating environment without running into objects    Blood pressure (!) 157/68, pulse 84, temperature 98.1 F (36.7 C), resp.  rate 18, SpO2 100 %.  Physical Exam  Constitutional: No distress.  obese  HENT:  Head: Normocephalic and atraumatic.  Eyes: Pupils are equal, round, and reactive to light. EOM are normal. Left eye exhibits no discharge. No scleral icterus.  Neck: No JVD present. No tracheal deviation present. No thyromegaly present.  Cardiovascular: Normal rate and regular rhythm. Exam reveals no friction rub.  No murmur heard.  Respiratory: Effort normal. No respiratory distress. She has no wheezes. She has no rales.  GI: Soft. Bowel sounds are normal. She exhibits no distension. There is no tenderness.  Musculoskeletal: She exhibits edema.  Lymphadenopathy:  She has no cervical adenopathy.  Neurological:  Alert and oriented to name, hospital. Follows commands. Some delays in processing. Right central 7 and decreased tongue control. Speech dysarthric. Mild right facial sensory loss. Right homonymous hemianopsia. RUE grossly 4-/5 prox to distal. LUE 4 to 4+/5 prox to distal. RLE 2-3/5. LLE 4+ to 4/5. Sensation decreased to LT RUE and RLE.Decr FTN R>L UE's  Skin: Skin is warm and dry.  A few tattoos.  Psychiatric:  Flat, delayed   Lab Results Last 48 Hours  Imaging Results (Last 48 hours)     Medical Problem List and Plan:  1. Right hemiparesis and functional deficits secondary to left periventricular white matter and right frontal lobe infarcts  -admit to inpatient rehab  2. DVT Prophylaxis/Anticoagulation: Pharmaceutical: Lovenox  3. Chronic HA/Pain Management: Depakote bid with tylenol prn.  4. Mood: LCSW to follow for evaluation and support.  5. Neuropsych: This patient is not fully capable of making decisions on her own behalf.  6. Skin/Wound Care: routine pressure relief measures.  7. Fluids/Electrolytes/Nutrition: Monitor I/O  8. T2DM: Hgb A1c- 13.6 Continue Levemir daily. Monitor BS ac/hs. Educate on importance of compliance.  9. Dyslipidemia: On Crestor.  10. Anemia: Recheck CBC in am.  11. HTN: Monitor BP bid. Resume lisinopril at lower dose and titrate daily.    Post Admission Physician Evaluation:  1. Functional deficits secondary to CVA. 2. Patient is admitted to receive collaborative, interdisciplinary care between the physiatrist, rehab nursing staff, and therapy team. 3. Patient's level of medical complexity and substantial therapy needs in context of that medical necessity cannot be provided at a lesser intensity of care such as a SNF. 4. Patient has experienced  substantial functional loss from his/her baseline which was documented above under the "Functional History" and "Functional Status" headings. Judging by the patient's diagnosis, physical exam, and functional history, the patient has potential for functional progress which will result in measurable gains while on inpatient rehab. These gains will be of substantial and practical use upon discharge in facilitating mobility and self-care at the household level. 5. Physiatrist will provide 24 hour management of medical needs as well as oversight of the therapy plan/treatment and provide guidance as appropriate regarding the interaction of the two. 6. The Preadmission Screening has been reviewed and patient status is unchanged unless otherwise stated above. 7. 24 hour rehab nursing will assist with bladder management, bowel management, safety, skin/wound care, disease management, medication administration, pain management and patient education and help integrate therapy concepts, techniques,education, etc. 8. PT will assess and treat for/with: Lower extremity strength, range of motion, stamina, balance, functional mobility, safety, adaptive techniques and equipment, NMR, family education. Goals are: supervision. 9. OT will assess and treat for/with: ADL's, functional mobility, safety, upper extremity strength, adaptive techniques and equipment, NMR, visual-spatial awareness. Goals are: supervision. Therapy may proceed with showering this patient. 10. SLP will assess and treat for/with: speech, cognition, swallowing, family education. Goals are: supervision to min assist. 11. Case Management and Social Worker will assess and treat for psychological issues and discharge planning. 12. Team conference will be held weekly to assess progress toward goals and to determine barriers to discharge. 13. Patient will receive at least 3 hours of therapy per day at least 5 days per week. 14. ELOS: 11-14 days  15. Prognosis:  excellent   I have personally performed a face to face diagnostic evaluation of this patient and formulated the key components of the plan. Additionally, I have personally reviewed laboratory data, imaging studies, as well as relevant notes and concur with the physician assistant's documentation above.   Ranelle Oyster, MD, [place] [facility]  Jacquelynn Cree, PA-C  07/13/2017

## 2017-07-13 NOTE — H&P (Signed)
Physical Medicine and Rehabilitation Admission H&P    Chief Complaint  Patient presents with  . Cerebrovascular Accident with functional deficits.     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 She had  worsening of slurred speech and right sided weakness and was admitted to [facility] on 07/11/17 for work up. MRI brain repeated and showed few new strokes in parasagittal cortical and subcortical left frontal lobe. Patient with negative work up for vasculitis, hypercoagulable state as well as spinal tap. Dr. Leonie Man felt that storke due to advanced intracranial atherosclerosis and recommended DAPT for 3 months.  Loop recorder placed by Dr. Rayann Heman to rule out A fib. Patient with right sided weakness with cognitive deficits, lability, lethargy affecting ability to carry out ADLs and mobility. CIR recommended due to functional deficits.  and difficulty with   Review of Systems  Constitutional: Negative for fever.  HENT: Negative for hearing loss.   Eyes: Negative for pain.  Respiratory: Negative for cough.   Cardiovascular: Negative for chest pain.  Gastrointestinal: Negative for heartburn.  Genitourinary: Negative for dysuria.  Musculoskeletal: Negative for myalgias.  Skin: Negative for rash.  Neurological: Positive for sensory change, speech change and focal weakness.  Psychiatric/Behavioral: Negative for depression and substance abuse.      Past Medical History:  Diagnosis Date  . Diabetes mellitus without complication ([place])   . Headache   . Hyperlipidemia   . Hypertension   . Stroke ([place])   . TIA (transient ischemic attack)     Past Surgical History:  Procedure Laterality Date  . APPENDECTOMY    . CHOLECYSTECTOMY    . FOOT GANGLION EXCISION    . HERNIA REPAIR    . IR GENERIC HISTORICAL   12/30/2015   IR ANGIO INTRA EXTRACRAN SEL COM CAROTID INNOMINATE BILAT MOD SED 12/30/2015 Luanne Bras, MD [facility]-INTERV RAD  . IR GENERIC HISTORICAL  12/30/2015   IR ANGIO VERTEBRAL SEL VERTEBRAL BILAT MOD SED 12/30/2015 Luanne Bras, MD [facility]  . TEE WITHOUT CARDIOVERSION N/A 04/19/2015   Procedure: TRANSESOPHAGEAL ECHOCARDIOGRAM (TEE);  Surgeon: Jerline Pain, MD;  Location: [facility] ENDOSCOPY;  Service: Cardiovascular;  Laterality: N/A;  . VAGINAL HYSTERECTOMY      Family History  Problem Relation Age of Onset  . Stroke Father        35s  . Heart attack Father 32  . COPD Mother   . Heart failure Brother   . Cancer Maternal Grandmother        unknown   . COPD Unknown   . Heart failure Maternal Grandfather   . Hypertension Maternal Grandfather   . Cancer Paternal Grandfather        lung  . Diabetes Paternal Grandmother     Social History:  reports that she has never smoked. She has never used smokeless tobacco. She reports that she does not drink alcohol or use drugs.   Allergies  Allergen Reactions  . Erythromycin Itching  . Latex Itching    Medications Prior to Admission  Medication Sig Dispense Refill  . aspirin EC 81 MG tablet Take 1 tablet by mouth every morning.    . clopidogrel (PLAVIX) 75 MG tablet Take 1 tablet (75 mg total) by mouth daily. 30 tablet 11  . Coenzyme Q10 (CO Q-10) 200 MG CAPS Take 200 mg by mouth  every evening.    Marland Kitchen glimepiride (AMARYL) 2 MG tablet Take 2 mg by mouth daily with breakfast.    . insulin detemir (LEVEMIR) 100 UNIT/ML injection Inject 40 Units into the skin daily before breakfast.     . lisinopril (PRINIVIL,ZESTRIL) 40 MG tablet Take 1 tablet (40 mg total) by mouth daily. 30 tablet 3  . Magnesium 500 MG TABS Take 500 mg by mouth every evening.     . Riboflavin (B2) 100 MG TABS Take 200 mg by mouth every morning.    . rosuvastatin (CRESTOR) 40 MG tablet Take 1 tablet (40 mg total) by mouth at bedtime. 30 tablet 3  . divalproex  (DEPAKOTE) 500 MG DR tablet Take 1 tablet (500 mg total) by mouth 2 (two) times daily. (Patient not taking: Reported on 07/11/2017) 60 tablet 3    Drug Regimen Review  Drug regimen was reviewed and remains appropriate with no significant issues identified  Home: Home Living Family/patient expects to be discharged to:: Private residence Living Arrangements: Children Available Help at Discharge: Available 24 hours/day, Family Type of Home: House Home Access: Stairs to enter Technical brewer of Steps: 4 Entrance Stairs-Rails: Can reach both Home Layout: One level Bathroom Shower/Tub: Chiropodist: Standard Home Equipment: Environmental consultant - 4 wheels, Shower seat Additional Comments: pt lives with her daughter and her family. She has a lift chair at home in additon to equipment above   Functional History: Prior Function Level of Independence: Needs assistance Gait / Transfers Assistance Needed: prior to admit to [facility], pt was ambulating with rollator ADL's / Homemaking Assistance Needed: prior to admit to [facility], daughter was helping with bathing, dressing, and home mgmt  Functional Status:  Mobility: Bed Mobility Overal bed mobility: Needs Assistance Bed Mobility: Supine to Sit Supine to sit: Supervision Sit to supine: Min assist General bed mobility comments: supervision for safety Transfers Overall transfer level: Needs assistance Equipment used: Rolling walker (2 wheeled) Transfers: Sit to/from Stand Sit to Stand: Min assist Stand pivot transfers: Min assist  Lateral/Scoot Transfers: Mod assist General transfer comment: assist to steady; increased time and effort; cues for safety Ambulation/Gait Ambulation/Gait assistance: Min assist Ambulation Distance (Feet): 75 Feet Assistive device: Rolling walker (2 wheeled) Gait Pattern/deviations: Step-through pattern, Decreased step length - right, Decreased step length - left, Trunk flexed, Drifts right/left General  Gait Details: cues for forward gaze, posture, and safe use of AD; pt with tendency to drift R/L and unaware of surroundings with assist and directional cues needed for navigating environment; pt fatigued quickly and attempting to lean on RW to rest    ADL: ADL Overall ADL's : Needs assistance/impaired Grooming: Minimal assistance, Sitting Upper Body Bathing: Minimal assistance, Sitting Lower Body Bathing: Moderate assistance, Sit to/from stand Upper Body Dressing : Moderate assistance, Sitting Lower Body Dressing: Moderate assistance, Sit to/from stand Toilet Transfer: Stand-pivot, Minimal assistance, BSC Toileting- Clothing Manipulation and Hygiene: Maximal assistance Toileting - Clothing Manipulation Details (indicate cue type and reason): incontinent Functional mobility during ADLs: Minimal assistance, Cueing for safety, Cueing for sequencing  Cognition: Cognition Overall Cognitive Status: (son in law present but still not positive of baseline) Orientation Level: Oriented X4 Cognition Arousal/Alertness: Awake/alert Behavior During Therapy: Flat affect Overall Cognitive Status: (son in law present but still not positive of baseline) Area of Impairment: Following commands, Memory, Safety/judgement, Awareness, Attention, Problem solving Orientation Level: Disoriented to, Time("August 2002") Current Attention Level: Sustained Memory: Decreased short-term memory Following Commands: Follows one step commands with increased time Safety/Judgement: Decreased  awareness of safety, Decreased awareness of deficits Awareness: Emergent Problem Solving: Slow processing, Decreased initiation, Difficulty sequencing, Requires verbal cues, Requires tactile cues General Comments: pt with decreased awareness of surroundings and difficulty navigating environment without running into objects   Blood pressure (!) 157/68, pulse 84, temperature 98.1 F (36.7 C), resp. rate 18, SpO2 100 %. Physical Exam    Constitutional: No distress.  obese  HENT:  Head: Normocephalic and atraumatic.  Eyes: Pupils are equal, round, and reactive to light. EOM are normal. Left eye exhibits no discharge. No scleral icterus.  Neck: No JVD present. No tracheal deviation present. No thyromegaly present.  Cardiovascular: Normal rate and regular rhythm. Exam reveals no friction rub.  No murmur heard. Respiratory: Effort normal. No respiratory distress. She has no wheezes. She has no rales.  GI: Soft. Bowel sounds are normal. She exhibits no distension. There is no tenderness.  Musculoskeletal: She exhibits edema.  Lymphadenopathy:    She has no cervical adenopathy.  Neurological:  Alert and oriented to name, hospital. Follows commands. Some delays in processing. Right central 7 and decreased tongue control. Speech dysarthric. Mild right facial sensory loss. Right homonymous hemianopsia. RUE grossly 4-/5 prox to distal. LUE 4 to 4+/5 prox to distal. RLE 2-3/5. LLE 4+ to 4/5. Sensation decreased to LT RUE and RLE.Decr FTN R>L UE's  Skin: Skin is warm and dry.  A few tattoos.   Psychiatric:  Flat, delayed    Results for orders placed or performed during the hospital encounter of 07/11/17 (from the past 48 hour(s))  Ethanol     Status: None   Collection Time: 07/11/17  5:17 PM  Result Value Ref Range   Alcohol, Ethyl (B) <10 <10 mg/dL    Comment:        LOWEST DETECTABLE LIMIT FOR SERUM ALCOHOL IS 10 mg/dL FOR MEDICAL PURPOSES ONLY Performed at [facility] [address], [place], [place] [address]   Protime-INR     Status: None   Collection Time: 07/11/17  5:17 PM  Result Value Ref Range   Prothrombin Time 12.9 11.4 - 15.2 seconds   INR 0.98     Comment: Performed at [facility] [address] [place], [place] [address]  APTT     Status: Abnormal   Collection Time: 07/11/17  5:17 PM  Result Value Ref Range   aPTT 38 (H) 24 - 36 seconds    Comment:        IF BASELINE aPTT IS  ELEVATED, SUGGEST PATIENT RISK ASSESSMENT BE USED TO DETERMINE APPROPRIATE ANTICOAGULANT THERAPY. Performed at [facility], [place] [address], [place], [place] [address]   CBC     Status: Abnormal   Collection Time: 07/11/17  5:17 PM  Result Value Ref Range   WBC 4.8 4.0 - 10.5 K/uL   RBC 3.34 (L) 3.87 - 5.11 MIL/uL   Hemoglobin 9.5 (L) 12.0 - 15.0 g/dL   HCT 30.1 (L) 36.0 - 46.0 %   MCV 90.1 78.0 - 100.0 fL   MCH 28.4 26.0 - 34.0 pg   MCHC 31.6 30.0 - 36.0 g/dL   RDW 14.0 11.5 - 15.5 %   Platelets 259 150 - 400 K/uL    Comment: Performed at [facility] [address], [place],  [address]  Differential     Status: None   Collection Time: 07/11/17  5:17 PM  Result Value Ref Range   Neutrophils Relative % 63 %  Neutro Abs 3.0 1.7 - 7.7 K/uL   Lymphocytes Relative 28 %   Lymphs Abs 1.4 0.7 - 4.0 K/uL   Monocytes Relative 7 %   Monocytes Absolute 0.3 0.1 - 1.0 K/uL   Eosinophils Relative 2 %   Eosinophils Absolute 0.1 0.0 - 0.7 K/uL   Basophils Relative 0 %   Basophils Absolute 0.0 0.0 - 0.1 K/uL    Comment: Performed at [facility] [address], [place], [place] [address]  Comprehensive metabolic panel     Status: Abnormal   Collection Time: 07/11/17  5:17 PM  Result Value Ref Range   Sodium 142 135 - 145 mmol/L   Potassium 4.2 3.5 - 5.1 mmol/L   Chloride 108 101 - 111 mmol/L   CO2 27 22 - 32 mmol/L   Glucose, Bld 145 (H) 65 - 99 mg/dL   BUN 24 (H) 6 - 20 mg/dL   Creatinine, Ser 1.12 (H) 0.44 - 1.00 mg/dL   Calcium 9.7 8.9 - 10.3 mg/dL   Total Protein 6.7 6.5 - 8.1 g/dL   Albumin 2.8 (L) 3.5 - 5.0 g/dL   AST 16 15 - 41 U/L   ALT 11 (L) 14 - 54 U/L   Alkaline Phosphatase 66 38 - 126 U/L   Total Bilirubin 0.6 0.3 - 1.2 mg/dL   GFR calc non Af Amer 55 (L) >60 mL/min   GFR calc Af Amer >60 >60 mL/min    Comment: (NOTE) The eGFR has been calculated using the CKD EPI equation. This calculation has not been validated in all clinical  situations. eGFR's persistently <60 mL/min signify possible Chronic Kidney Disease.    Anion gap 7 5 - 15    Comment: Performed at [facility] [address] [place], [place] [address]  I-stat troponin, ED     Status: None   Collection Time: 07/11/17  5:25 PM  Result Value Ref Range   Troponin i, poc 0.02 0.00 - 0.08 ng/mL   Comment 3            Comment: Due to the release kinetics of cTnI, a negative result within the first hours of the onset of symptoms does not rule out myocardial infarction with certainty. If myocardial infarction is still suspected, repeat the test at appropriate intervals.   I-Stat beta hCG blood, ED     Status: Abnormal   Collection Time: 07/11/17  5:25 PM  Result Value Ref Range   I-stat hCG, quantitative 11.7 (H) <5 mIU/mL   Comment 3            Comment:   GEST. AGE      CONC.  (mIU/mL)   <=1 WEEK        5 - 50     2 WEEKS       50 - 500     3 WEEKS       100 - 10,000     4 WEEKS     1,000 - 30,000        FEMALE AND NON-PREGNANT FEMALE:     LESS THAN 5 mIU/mL   I-Stat Chem 8, ED     Status: Abnormal   Collection Time: 07/11/17  5:27 PM  Result Value Ref Range   Sodium 143 135 - 145 mmol/L   Potassium 4.2 3.5 - 5.1 mmol/L   Chloride 109 101 - 111 mmol/L   BUN 24 (H) 6 - 20 mg/dL   Creatinine, Ser 1.10 (  H) 0.44 - 1.00 mg/dL   Glucose, Bld 143 (H) 65 - 99 mg/dL   Calcium, Ion 1.28 1.15 - 1.40 mmol/L   TCO2 25 22 - 32 mmol/L   Hemoglobin 9.2 (L) 12.0 - 15.0 g/dL   HCT 27.0 (L) 36.0 - 46.0 %  Urine rapid drug screen (hosp performed)     Status: None   Collection Time: 07/11/17  7:05 PM  Result Value Ref Range   Opiates NONE DETECTED NONE DETECTED   Cocaine NONE DETECTED NONE DETECTED   Benzodiazepines NONE DETECTED NONE DETECTED   Amphetamines NONE DETECTED NONE DETECTED   Tetrahydrocannabinol NONE DETECTED NONE DETECTED   Barbiturates NONE DETECTED NONE DETECTED    Comment: (NOTE) DRUG SCREEN FOR MEDICAL PURPOSES ONLY.  IF  CONFIRMATION IS NEEDED FOR ANY PURPOSE, NOTIFY LAB WITHIN 5 DAYS. LOWEST DETECTABLE LIMITS FOR URINE DRUG SCREEN Drug Class                     Cutoff (ng/mL) Amphetamine and metabolites    1000 Barbiturate and metabolites    200 Benzodiazepine                 242 Tricyclics and metabolites     300 Opiates and metabolites        300 Cocaine and metabolites        300 THC                            50 Performed at [facility], [place] [address], [place], [place] [address]   Urinalysis, Routine w reflex microscopic     Status: Abnormal   Collection Time: 07/11/17  7:05 PM  Result Value Ref Range   Color, Urine AMBER (A) YELLOW    Comment: BIOCHEMICALS MAY BE AFFECTED BY COLOR   APPearance CLOUDY (A) CLEAR   Specific Gravity, Urine 1.019 1.005 - 1.030   pH 6.0 5.0 - 8.0   Glucose, UA 50 (A) NEGATIVE mg/dL   Hgb urine dipstick SMALL (A) NEGATIVE   Bilirubin Urine NEGATIVE NEGATIVE   Ketones, ur NEGATIVE NEGATIVE mg/dL   Protein, ur >=300 (A) NEGATIVE mg/dL   Nitrite NEGATIVE NEGATIVE   Leukocytes, UA NEGATIVE NEGATIVE   RBC / HPF 0-5 0 - 5 RBC/hpf   WBC, UA 0-5 0 - 5 WBC/hpf   Bacteria, UA NONE SEEN NONE SEEN   Squamous Epithelial / LPF 0-5 0 - 5    Comment: Please note change in reference range. Performed at [facility], [place] [address] [place], [place] [address]   CBG monitoring, ED     Status: Abnormal   Collection Time: 07/11/17  7:59 PM  Result Value Ref Range   Glucose-Capillary 63 (L) 65 - 99 mg/dL  CBG monitoring, ED     Status: None   Collection Time: 07/11/17  9:11 PM  Result Value Ref Range   Glucose-Capillary 98 65 - 99 mg/dL  CBG monitoring, ED     Status: Abnormal   Collection Time: 07/12/17 12:18 AM  Result Value Ref Range   Glucose-Capillary 49 (L) 65 - 99 mg/dL   Comment 1 Document in Chart   CBG monitoring, ED     Status: Abnormal   Collection Time: 07/12/17 12:49 AM  Result Value Ref Range   Glucose-Capillary 166 (H) 65 - 99 mg/dL    Comment 1 Document in Chart   CBG monitoring, ED  Status: Abnormal   Collection Time: 07/12/17  2:10 AM  Result Value Ref Range   Glucose-Capillary 111 (H) 65 - 99 mg/dL  CBG monitoring, ED     Status: None   Collection Time: 07/12/17  5:11 AM  Result Value Ref Range   Glucose-Capillary 82 65 - 99 mg/dL  CBG monitoring, ED     Status: None   Collection Time: 07/12/17  7:10 AM  Result Value Ref Range   Glucose-Capillary 84 65 - 99 mg/dL  CBG monitoring, ED     Status: None   Collection Time: 07/12/17  9:19 AM  Result Value Ref Range   Glucose-Capillary 79 65 - 99 mg/dL  CBG monitoring, ED     Status: Abnormal   Collection Time: 07/12/17  5:05 PM  Result Value Ref Range   Glucose-Capillary 167 (H) 65 - 99 mg/dL  Glucose, capillary     Status: Abnormal   Collection Time: 07/12/17  6:53 PM  Result Value Ref Range   Glucose-Capillary 209 (H) 65 - 99 mg/dL  HIV antibody (Routine Testing)     Status: None   Collection Time: 07/12/17  7:05 PM  Result Value Ref Range   HIV Screen 4th Generation wRfx Non Reactive Non Reactive    Comment: (NOTE) Performed At: [facility] [address] [place], [place] [id number] Rush Farmer MD DX:[id number] Performed at [facility], [place] [address] [place], [place] [address]   Iron and TIBC     Status: Abnormal   Collection Time: 07/12/17  7:05 PM  Result Value Ref Range   Iron 42 28 - 170 ug/dL   TIBC 239 (L) 250 - 450 ug/dL   Saturation Ratios 18 10.4 - 31.8 %   UIBC 197 ug/dL    Comment: Performed at [facility], [place] [address], [place], [place] [address]  Reticulocytes     Status: Abnormal   Collection Time: 07/12/17  7:05 PM  Result Value Ref Range   Retic Ct Pct 0.7 0.4 - 3.1 %   RBC. 3.25 (L) 3.87 - 5.11 MIL/uL   Retic Count, Absolute 22.8 19.0 - 186.0 K/uL    Comment: Performed at [facility] [address], [place], [place] [address]  Glucose, capillary     Status: Abnormal   Collection Time:  07/12/17  9:14 PM  Result Value Ref Range   Glucose-Capillary 113 (H) 65 - 99 mg/dL   Comment 1 Notify RN    Comment 2 Document in Chart   Glucose, capillary     Status: Abnormal   Collection Time: 07/13/17 12:50 AM  Result Value Ref Range   Glucose-Capillary 119 (H) 65 - 99 mg/dL   Comment 1 Notify RN    Comment 2 Document in Chart   CBC     Status: Abnormal   Collection Time: 07/13/17  4:26 AM  Result Value Ref Range   WBC 4.0 4.0 - 10.5 K/uL   RBC 3.20 (L) 3.87 - 5.11 MIL/uL   Hemoglobin 9.1 (L) 12.0 - 15.0 g/dL   HCT 28.9 (L) 36.0 - 46.0 %   MCV 90.3 78.0 - 100.0 fL   MCH 28.4 26.0 - 34.0 pg   MCHC 31.5 30.0 - 36.0 g/dL   RDW 13.9 11.5 - 15.5 %   Platelets 233 150 - 400 K/uL    Comment: Performed at [facility], [place]. [address] [place], [place] [address]  Basic metabolic panel  Status: Abnormal   Collection Time: 07/13/17  4:26 AM  Result Value Ref Range   Sodium 143 135 - 145 mmol/L   Potassium 3.6 3.5 - 5.1 mmol/L   Chloride 107 101 - 111 mmol/L   CO2 26 22 - 32 mmol/L   Glucose, Bld 128 (H) 65 - 99 mg/dL   BUN 13 6 - 20 mg/dL   Creatinine, Ser 0.97 0.44 - 1.00 mg/dL   Calcium 9.4 8.9 - 10.3 mg/dL   GFR calc non Af Amer >60 >60 mL/min   GFR calc Af Amer >60 >60 mL/min    Comment: (NOTE) The eGFR has been calculated using the CKD EPI equation. This calculation has not been validated in all clinical situations. eGFR's persistently <60 mL/min signify possible Chronic Kidney Disease.    Anion gap 10 5 - 15    Comment: Performed at [facility] [address], [place], [place] [address]  Glucose, capillary     Status: Abnormal   Collection Time: 07/13/17  6:11 AM  Result Value Ref Range   Glucose-Capillary 123 (H) 65 - 99 mg/dL   Comment 1 Notify RN    Comment 2 Document in Chart    Mr Brain Wo Contrast (neuro Protocol)  Result Date: 07/11/2017 CLINICAL DATA:  Initial evaluation for worsening right-sided weakness with slurred speech, recent  stroke. EXAM: MRI HEAD WITHOUT CONTRAST TECHNIQUE: Multiplanar, multiecho pulse sequences of the brain and surrounding structures were obtained without intravenous contrast. COMPARISON:  Prior MRI from 07/09/2017. FINDINGS: Brain: Age-related cerebral atrophy with chronic small vessel ischemic disease again noted. Encephalomalacia with gliosis within the parasagittal left parietal lobe consistent with remote ischemic infarct. Small remote lacunar infarcts present within the bilateral basal ganglia/corona radiata as well as the left thalamus. Chronic hemorrhagic blood products present about several of these infarcts. Continued interval evolution of recently identified small ischemic infarcts involving the left periventricular white matter, relatively stable in size and distribution as compared to previous. No evidence for hemorrhagic transformation or significant mass effect. There are several small areas of new acute infarction involving the cortical gray matter and underlying periventricular white matter of the parasagittal right frontal lobe (series 3, image 34, 32). Largest area of infarct measures 12 mm. No associated hemorrhage or mass effect. No other areas of new or interval infarction. Gray-white matter differentiation otherwise maintained. No mass lesion, midline shift or mass effect. No hydrocephalus. No extra-axial fluid collection. Major dural sinuses are grossly patent. Pituitary gland suprasellar region normal. Midline structures intact. Vascular: Major intracranial vascular flow voids are maintained at the skull base. Skull and upper cervical spine: Craniocervical junction normal. Upper cervical spine normal. Bone marrow signal intensity within normal limits. Hyperostosis frontalis interna noted. No scalp soft tissue abnormality. Sinuses/Orbits: Globes and orbital soft tissues within normal limits. Patient status post lens extraction bilaterally. Mild scattered mucosal thickening within the ethmoidal  air cells and maxillary sinuses. No air-fluid level to suggest acute sinusitis. No mastoid effusion. Inner ear structures normal. Other: None. IMPRESSION: 1. Few small volume new acute ischemic infarcts involving the parasagittal cortical and subcortical/periventricular left frontal lobe, new relative to recent MRI from 07/09/2017. No associated hemorrhage or mass effect. 2. Normal expected interval evolution of recently identified small volume left-sided infarcts, relatively stable in size and distribution as compared to previous. 3. Otherwise stable appearance of the brain with chronic atrophy and small vessel ischemic disease, with multiple scattered remote infarcts as above. Electronically Signed   By: Jeannine Boga  M.D.   On: 07/11/2017 22:44       Medical Problem List and Plan: 1.  Right hemiparesis and functional deficits secondary to left periventricular white matter and right frontal lobe infarcts  -admit to inpatient rehab 2.  DVT Prophylaxis/Anticoagulation: Pharmaceutical: Lovenox 3. Chronic HA/Pain Management: Depakote  bid with tylenol prn.  4. Mood: LCSW to follow for evaluation and support.  5. Neuropsych: This patient is not fully capable of making decisions on her own behalf. 6. Skin/Wound Care: routine pressure relief measures.  7. Fluids/Electrolytes/Nutrition: Monitor I/O 8. T2DM: Hgb A1c- 13.6 Continue Levemir daily.  Monitor BS ac/hs. Educate on importance of compliance.  9. Dyslipidemia: On Crestor. 10.  Anemia: Recheck CBC in am.  11. HTN: Monitor BP bid. Resume lisinopril at lower dose and titrate  daily.   Post Admission Physician Evaluation: 1. Functional deficits secondary  to CVA. 2. Patient is admitted to receive collaborative, interdisciplinary care between the physiatrist, rehab nursing staff, and therapy team. 3. Patient's level of medical complexity and substantial therapy needs in context of that medical necessity cannot be provided at a lesser  intensity of care such as a SNF. 4. Patient has experienced substantial functional loss from his/her baseline which was documented above under the "Functional History" and "Functional Status" headings.  Judging by the patient's diagnosis, physical exam, and functional history, the patient has potential for functional progress which will result in measurable gains while on inpatient rehab.  These gains will be of substantial and practical use upon discharge  in facilitating mobility and self-care at the household level. 5. Physiatrist will provide 24 hour management of medical needs as well as oversight of the therapy plan/treatment and provide guidance as appropriate regarding the interaction of the two. 6. The Preadmission Screening has been reviewed and patient status is unchanged unless otherwise stated above. 7. 24 hour rehab nursing will assist with bladder management, bowel management, safety, skin/wound care, disease management, medication administration, pain management and patient education  and help integrate therapy concepts, techniques,education, etc. 8. PT will assess and treat for/with: Lower extremity strength, range of motion, stamina, balance, functional mobility, safety, adaptive techniques and equipment, NMR, family education.   Goals are: supervision. 9. OT will assess and treat for/with: ADL's, functional mobility, safety, upper extremity strength, adaptive techniques and equipment, NMR, visual-spatial awareness.   Goals are: supervision. Therapy may proceed with showering this patient. 10. SLP will assess and treat for/with: speech, cognition, swallowing, family education.  Goals are: supervision to min assist. 11. Case Management and Social Worker will assess and treat for psychological issues and discharge planning. 12. Team conference will be held weekly to assess progress toward goals and to determine barriers to discharge. 13. Patient will receive at least 3 hours of therapy per  day at least 5 days per week. 14. ELOS: 11-14 days       15. Prognosis:  excellent   I have personally performed a face to face diagnostic evaluation of this patient and formulated the key components of the plan.  Additionally, I have personally reviewed laboratory data, imaging studies, as well as relevant notes and concur with the physician assistant's documentation above.  Meredith Staggers, MD, Mellody Drown   Bary Leriche, PA-C 07/13/2017

## 2017-07-13 NOTE — Progress Notes (Signed)
Physical Therapy Treatment Patient Details Name: Kelly Romero MRN: [id number] DOB: 1965/10/13 Today's Date: 07/13/2017    History of Present Illness Pt. is a 52 y.o. female who was admitted to [facility] with Aphasia, RUE weakness, slurred speech, and difficulty with word finding. Pt. has a PMHx of CVA, TIA, DM, Headache, Hyperlipidemia, HTN, TIA, Appendectomy, Cholecystectomy, Hernia Repair. She went home with family and had worsening of speech and returned to [facility] ED. MRI showed new L frontal lobe infarcts compared to MRI on 07/09/17.     PT Comments    Patient required assistance for safe transfers and ambulation. Pt with c/o dizziness in standing and reported dizziness is the reason she had recurrent falls at home PTA. Pt with decreased awareness of surroundings and required assistance and directional cues for navigating environment. Pt fatigued quickly and leaning on RW for rest at times. Pt will benefit from vestibular eval due to noted nystagmus and chronic feeling of dizziness. Current plan remains appropriate.   Follow Up Recommendations  CIR;Supervision/Assistance - 24 hour     Equipment Recommendations  Other (comment)(TBD)    Recommendations for Other Services OT consult;Rehab consult;Speech consult     Precautions / Restrictions Precautions Precautions: Fall Restrictions Weight Bearing Restrictions: No    Mobility  Bed Mobility Overal bed mobility: Needs Assistance Bed Mobility: Supine to Sit     Supine to sit: Supervision     General bed mobility comments: supervision for safety  Transfers Overall transfer level: Needs assistance Equipment used: Rolling walker (2 wheeled) Transfers: Sit to/from Stand Sit to Stand: Min assist         General transfer comment: assist to steady; increased time and effort; cues for safety  Ambulation/Gait Ambulation/Gait assistance: Min assist Ambulation Distance (Feet): 75 Feet Assistive device: Rolling walker (2  wheeled) Gait Pattern/deviations: Step-through pattern;Decreased step length - right;Decreased step length - left;Trunk flexed;Drifts right/left     General Gait Details: cues for forward gaze, posture, and safe use of AD; pt with tendency to drift R/L and unaware of surroundings with assist and directional cues needed for navigating environment; pt fatigued quickly and attempting to lean on RW to rest   Stairs             Wheelchair Mobility    Modified Rankin (Stroke Patients Only)       Balance Overall balance assessment: History of Falls;Needs assistance Sitting-balance support: Feet unsupported;Single extremity supported Sitting balance-Leahy Scale: Fair Sitting balance - Comments: single UE support needed when reaching outside BOS     Standing balance-Leahy Scale: Poor                              Cognition Arousal/Alertness: Awake/alert Behavior During Therapy: Flat affect Overall Cognitive Status: (son in law present but still not positive of baseline) Area of Impairment: Following commands;Memory;Safety/judgement;Awareness;Attention;Problem solving                   Current Attention Level: Sustained Memory: Decreased short-term memory Following Commands: Follows one step commands with increased time Safety/Judgement: Decreased awareness of safety;Decreased awareness of deficits Awareness: Emergent Problem Solving: Slow processing;Decreased initiation;Difficulty sequencing;Requires verbal cues;Requires tactile cues General Comments: pt with decreased awareness of surroundings and difficulty navigating environment without running into objects      Exercises      General Comments General comments (skin integrity, edema, etc.): nystagmus noted       Pertinent Vitals/Pain Pain Assessment: No/denies  pain    Home Living                      Prior Function            PT Goals (current goals can now be found in the care  plan section) Progress towards PT goals: Progressing toward goals    Frequency    Min 4X/week      PT Plan Current plan remains appropriate    Co-evaluation PT/OT/SLP Co-Evaluation/Treatment: Yes Reason for Co-Treatment: Necessary to address cognition/behavior during functional activity;For patient/therapist safety;To address functional/ADL transfers PT goals addressed during session: Mobility/safety with mobility        AM-PAC PT "6 Clicks" Daily Activity  Outcome Measure  Difficulty turning over in bed (including adjusting bedclothes, sheets and blankets)?: A Little Difficulty moving from lying on back to sitting on the side of the bed? : A Lot Difficulty sitting down on and standing up from a chair with arms (e.g., wheelchair, bedside commode, etc,.)?: Unable Help needed moving to and from a bed to chair (including a wheelchair)?: A Little Help needed walking in hospital room?: A Little Help needed climbing 3-5 steps with a railing? : A Lot 6 Click Score: 14    End of Session Equipment Utilized During Treatment: Gait belt Activity Tolerance: Patient tolerated treatment well Patient left: in chair;with call bell/phone within reach;with family/visitor present;Other (comment)(no chair alarm available on unit) Nurse Communication: Mobility status PT Visit Diagnosis: Muscle weakness (generalized) (M62.81);Difficulty in walking, not elsewhere classified (R26.2)     Time: 0921-0950 PT Time Calculation (min) (ACUTE ONLY): 29 min  Charges:  $Gait Training: 8-22 mins                    G Codes:       Erline Levine, PTA Pager: [phone]     Carolynne Edouard 07/13/2017, 11:10 AM

## 2017-07-13 NOTE — Discharge Instructions (Signed)
Stroke Prevention Some health problems and behaviors may make it more likely for you to have a stroke. Below are ways to lessen your risk of having a stroke.  Be active for at least 30 minutes on most or all days.  Do not smoke. Try not to be around others who smoke.  Do not drink too much alcohol. ? Do not have more than 2 drinks a day if you are a man. ? Do not have more than 1 drink a day if you are a woman and are not pregnant.  Eat healthy foods, such as fruits and vegetables. If you were put on a specific diet, follow the diet as told.  Keep your cholesterol levels under control through diet and medicines. Look for foods that are low in saturated fat, trans fat, cholesterol, and are high in fiber.  If you have diabetes, follow all diet plans and take your medicine as told.  Ask your doctor if you need treatment to lower your blood pressure. If you have high blood pressure (hypertension), follow all diet plans and take your medicine as told by your doctor.  If you are 70-67 years old, have your blood pressure checked every 3-5 years. If you are age 48 or older, have your blood pressure checked every year.  Keep a healthy weight. Eat foods that are low in calories, salt, saturated fat, trans fat, and cholesterol.  Do not take drugs.  Avoid birth control pills, if this applies. Talk to your doctor about the risks of taking birth control pills.  Talk to your doctor if you have sleep problems (sleep apnea).  Take all medicine as told by your doctor. ? You may be told to take aspirin or blood thinner medicine. Take this medicine as told by your doctor. ? Understand your medicine instructions.  Make sure any other conditions you have are being taken care of.  Get help right away if:  You suddenly lose feeling (you feel numb) or have weakness in your face, arm, or leg.  Your face or eyelid hangs down to one side.  You suddenly feel confused.  You have trouble talking  (aphasia) or understanding what people are saying.  You suddenly have trouble seeing in one or both eyes.  You suddenly have trouble walking.  You are dizzy.  You lose your balance or your movements are clumsy (uncoordinated).  You suddenly have a very bad headache and you do not know the cause.  You have new chest pain.  Your heart feels like it is fluttering or skipping a beat (irregular heartbeat). Do not wait to see if the symptoms above go away. Get help right away. Call your local emergency services (911 in [place] Do not drive yourself to the hospital. This information is not intended to replace advice given to you by your health care provider. Make sure you discuss any questions you have with your health care provider. Document Released: 09/01/2011 Document Revised: 08/08/2015 Document Reviewed: 09/02/2012 Elsevier Interactive Patient Education  2018 [employer].   Ischemic Stroke An ischemic stroke is the sudden death of brain tissue. Blood carries oxygen to all areas of the body. This type of stroke happens when your blood does not flow to your brain like normal. Your brain cannot get the oxygen it needs. This is an emergency. It must be treated right away. Symptoms of a stroke usually happen all of a sudden. You may notice them when you wake up. They can include:  Weakness or  loss of feeling in your face, arm, or leg. This often happens on one side of the body.  Trouble walking.  Trouble moving your arms or legs.  Loss of balance or coordination.  Feeling confused.  Trouble talking or understanding what people are saying.  Slurred speech.  Trouble seeing.  Seeing two of one object (double vision).  Feeling dizzy.  Feeling sick to your stomach (nauseous) and throwing up (vomiting).  A very bad headache for no reason.  Get help as soon as any of these problems start. This is important. Some treatments work better if they are given right away. These  include:  Aspirin.  Medicines to control blood pressure.  A shot (injection) of medicine to break up the blood clot.  Treatments given in the blood vessel (artery) to take out the clot or break it up.  Other treatments may include:  Oxygen.  Fluids given through an IV tube.  Medicines to thin out your blood.  Procedures to help your blood flow better.  What increases the risk? Certain things may make you more likely to have a stroke. Some of these are things that you can change, such as:  Being very overweight (obesity).  Smoking.  Taking birth control pills.  Not being active.  Drinking too much alcohol.  Using drugs.  Other risk factors include:  High blood pressure.  High cholesterol.  Diabetes.  Heart disease.  Being [place] American, Native [address], Hispanic, or [place] Native.  Being over age 69.  Family history of stroke.  Having had blood clots, stroke, or warning stroke (transient ischemic attack, TIA) in the past.  Sickle cell disease.  Being a woman with a history of high blood pressure in pregnancy (preeclampsia).  Migraine headache.  Sleep apnea.  Having an irregular heartbeat (atrial fibrillation).  Long-term (chronic) diseases that cause soreness and swelling (inflammation).  Disorders that affect how your blood clots.  Follow these instructions at home: Medicines  Take over-the-counter and prescription medicines only as told by your doctor.  If you were told to take aspirin or another medicine to thin your blood, take it exactly as told by your doctor. ? Taking too much of the medicine can cause bleeding. ? If you do not take enough, it may not work as well.  Know the side effects of your medicines. If you are taking a blood thinner, make sure you: ? Hold pressure over any cuts for longer than usual. ? Tell your dentist and other doctors that you take this medicine. ? Avoid activities that may cause damage or injury to  your body. Eating and drinking  Follow instructions from your doctor about what you cannot eat or drink.  Eat healthy foods.  If you have trouble with swallowing, do these things to avoid choking: ? Take small bites when eating. ? Eat foods that are soft or pureed. Safety  Follow instructions from your health care team about physical activity.  Use a walker or cane as told by your doctor.  Keep your home safe so you do not fall. This may include: ? Having experts look at your home to make sure it is safe. ? Putting grab bars in the bedroom and bathroom. ? Using raised toilets. ? Putting a seat in the shower. General instructions  Do not use any tobacco products. ? Examples of these are cigarettes, chewing tobacco, and e-cigarettes. ? If you need help quitting, ask your doctor.  Limit how much alcohol you drink. This means no  more than 1 drink a day for nonpregnant women and 2 drinks a day for men. One drink equals 12 oz of beer, 5 oz of wine, or 1 oz of hard liquor.  If you need help to stop using drugs or alcohol, ask your doctor to refer you to a program or specialist.  Stay active. Exercise as told by your doctor.  Keep all follow-up visits as told by your doctor. This is important. Get help right away if:  You suddenly: ? Have weakness or loss of feeling in your face, arm, or leg. ? Feel confused. ? Have trouble talking or understanding what people are saying. ? Have trouble seeing. ? Have trouble walking. ? Have trouble moving your arms or legs. ? Feel dizzy. ? Lose your balance or coordination. ? Have a very bad headache and you do not know why.  You pass out (lose consciousness) or almost pass out.  You have jerky movements that you cannot control (seizure). These symptoms may be an emergency. Do not wait to see if the symptoms will go away. Get medical help right away. Call your local emergency services (911 in the [place] Do not drive yourself to the  hospital. This information is not intended to replace advice given to you by your health care provider. Make sure you discuss any questions you have with your health care provider. Document Released: 02/19/2011 Document Revised: 08/13/2015 Document Reviewed: 05/29/2015 Elsevier Interactive Patient Education  2018 [employer].   Hospital Discharge After a Stroke  Being discharged from the hospital after a stroke can feel overwhelming. Many things may be different, and it is normal to feel scared or anxious. Some stroke survivors may be able to return to their homes, and others may need more specialized care on a temporary or permanent basis. Your stroke care team will work with you to develop a discharge plan that is best for you. Ask questions if you do not understand something. Invite a friend or family member to participate in discharge planning. Understanding and following your discharge plan can help to prevent another stroke or other problems. Understanding your medicines After a stroke, your health care provider may prescribe one or more types of medicine. It is important to take medicines exactly as told by your health care provider. Serious harm, such as another stroke, can happen if you are unable to take your medicine exactly as prescribed. Make sure you understand:  What medicine to take.  Why you are taking the medicine.  How and when to take it.  If it can be taken with your other medicines and herbal supplements.  Possible side effects.  When to call your health care provider if you have any side effects.  How you will get and pay for your medicines. Medical assistance programs may be able to help you pay for prescription medicines if you cannot afford them.  If you are taking an anticoagulant, be sure to take it exactly as told by your health care provider. This type of medicine can increase the risk of bleeding because it works to prevent blood from clotting. You may need  to take certain precautions to prevent bleeding. You should contact your health care provider if you have:  Bleeding or bruising.  A fall or other injury to your head.  Blood in your urine or stool (feces).  Planning for home safety Take steps to prevent falls, such as installing grab bars or using a shower chair. Ask a friend or family  member to get needed things in place before you go home if possible. A therapist can come to your home to make recommendations for safety equipment. Ask your health care provider if you would benefit from this service or from home care. Getting needed equipment Ask your health care provider for a list of any medical equipment and supplies you will need at home. These may include items such as:  Walkers.  Canes.  Wheelchairs.  Hand-strengthening devices.  Special eating utensils.  Medical equipment can be rented or purchased, depending on your insurance coverage. Check with your insurance company about what is covered. Keeping follow-up visits After a stroke, you will need to follow up regularly with a health care provider. You may also need rehabilitation, which can include physical therapy, occupational therapy, or speech-language therapy. Keeping these appointments is very important to your recovery after a stroke. Be sure to bring your medicine list and discharge papers with you to your appointments. If you need help to keep track of your schedule, use a calendar or appointment reminder. Preventing another stroke Having a stroke puts you at risk for another stroke in the future. Ask your health care provider what actions you can take to lower the risk. These may include:  Increasing how much you exercise.  Making a healthy eating plan.  Quitting smoking.  Managing other health conditions, such as high blood pressure, high cholesterol, or diabetes.  Limiting alcohol use.  Knowing the warning signs of a stroke Make sure you understand the  signs of a stroke. Before you leave the hospital, you will receive information outlining the stroke warning signs. Share these with your friends and family members. "BE FAST" is an easy way to remember the main warning signs of a stroke:  B - Balance. Signs are dizziness, sudden trouble walking, or loss of balance.  E - Eyes. Signs are trouble seeing or a sudden change in vision.  F - Face. Signs are sudden weakness or numbness of the face, or the face or eyelid drooping on one side.  A - Arms. Signs are weakness or numbness in an arm. This happens suddenly and usually on one side of the body.  S - Speech. Signs are sudden trouble speaking, slurred speech, or trouble understanding what people say.  T - Time. Time to call emergency services. Write down what time symptoms started.  Other signs of stroke may include:  A sudden, severe headache with no known cause.  Nausea or vomiting.  Seizure.  These symptoms may represent a serious problem that is an emergency. Do not wait to see if the symptoms will go away. Get medical help right away. Call your local emergency services (911 in the [place] Do not drive yourself to the hospital. Make note of the time that you had your first symptoms. Your emergency responders or emergency room staff will need to know this information. Summary  Being discharged from the hospital after a stroke can feel overwhelming. It is normal to feel scared or anxious.  Make sure you take medicines exactly as told by your health care provider.  Know the warning signs of a stroke, and get help right way if you have any of these symptoms. "BE FAST" is an easy way to remember the main warning signs of a stroke. This information is not intended to replace advice given to you by your health care provider. Make sure you discuss any questions you have with your health care provider. Document Released: 06/05/2016 Document  Revised: 06/05/2016 Document Reviewed:  06/05/2016 Elsevier Interactive Patient Education  [employer].

## 2017-07-13 NOTE — Consult Note (Addendum)
ELECTROPHYSIOLOGY CONSULT NOTE  Patient ID: Kelly Romero MRN: [id number], DOB/AGE: [birth year]   Admit date: 07/11/2017 Date of Consult: 07/13/2017  Primary Physician: Noni Saupe, MD Primary Cardiologist: Allyson Sabal Reason for Consultation: Cryptogenic stroke; recommendations regarding Implantable Loop Recorder  History of Present Illness EP has been asked to evaluate [name] for placement of an implantable loop recorder to monitor for atrial fibrillation by Dr Pearlean Brownie.  She has had prior strokes with ILR recommended after extensive work up.  The patient has previously declined ILR implant. She presented to the hospital this admission with .  The patient was admitted on 07/11/2017 with worsening slurred speech and facial droop.  They first developed symptoms while after discharge from [facility].  Imaging demonstrated acute small infarcts in left hemisphere.  she has undergone workup for stroke including echocardiogram and carotid dopplers.  The patient has been monitored on telemetry which has demonstrated sinus rhythm with short run NCT.  She has undergone prior TEE as well as worn 30 day event monitor.  Prior to admission, the patient denies chest pain, shortness of breath, dizziness, palpitations, or syncope.  They are recovering from their stroke with plans to go to CIR at discharge.  Past Medical History:  Diagnosis Date  . Diabetes mellitus without complication (HCC)   . Headache   . Hyperlipidemia   . Hypertension   . Stroke (HCC)   . TIA (transient ischemic attack)      Surgical History:  Past Surgical History:  Procedure Laterality Date  . APPENDECTOMY    . CHOLECYSTECTOMY    . FOOT GANGLION EXCISION    . HERNIA REPAIR    . IR GENERIC HISTORICAL  12/30/2015   IR ANGIO INTRA EXTRACRAN SEL COM CAROTID INNOMINATE BILAT MOD SED 12/30/2015 Julieanne Cotton, MD [facility]  . IR GENERIC HISTORICAL  12/30/2015   IR ANGIO VERTEBRAL SEL VERTEBRAL BILAT MOD SED  12/30/2015 Julieanne Cotton, MD [facility]  . TEE WITHOUT CARDIOVERSION N/A 04/19/2015   Procedure: TRANSESOPHAGEAL ECHOCARDIOGRAM (TEE);  Surgeon: Jake Bathe, MD;  Location: [facility] ENDOSCOPY;  Service: Cardiovascular;  Laterality: N/A;  . VAGINAL HYSTERECTOMY       Medications Prior to Admission  Medication Sig Dispense Refill Last Dose  . aspirin EC 81 MG tablet Take 1 tablet by mouth every morning.   07/11/2017 at 0900  . clopidogrel (PLAVIX) 75 MG tablet Take 1 tablet (75 mg total) by mouth daily. 30 tablet 11 07/11/2017 at 0900  . Coenzyme Q10 (CO Q-10) 200 MG CAPS Take 200 mg by mouth every evening.   07/10/2017 at pm  . glimepiride (AMARYL) 2 MG tablet Take 2 mg by mouth daily with breakfast.   07/11/2017 at Unknown time  . insulin detemir (LEVEMIR) 100 UNIT/ML injection Inject 40 Units into the skin daily before breakfast.    07/11/2017 at am  . lisinopril (PRINIVIL,ZESTRIL) 40 MG tablet Take 1 tablet (40 mg total) by mouth daily. 30 tablet 3 07/11/2017 at am  . Magnesium 500 MG TABS Take 500 mg by mouth every evening.    07/10/2017 at pm  . Riboflavin (B2) 100 MG TABS Take 200 mg by mouth every morning.   07/11/2017 at am  . rosuvastatin (CRESTOR) 40 MG tablet Take 1 tablet (40 mg total) by mouth at bedtime. 30 tablet 3 07/10/2017 at pm  . divalproex (DEPAKOTE) 500 MG DR tablet Take 1 tablet (500 mg total) by mouth 2 (two) times daily. (Patient not taking: Reported  on 07/11/2017) 60 tablet 3 Not Taking at Unknown time    Inpatient Medications:  .  stroke: mapping our early stages of recovery book   Does not apply Once  . aspirin EC  81 mg Oral Daily  . clopidogrel  75 mg Oral Daily  . enoxaparin (LOVENOX) injection  40 mg Subcutaneous Daily  . insulin aspart  0-15 Units Subcutaneous Q6H  . insulin detemir  20 Units Subcutaneous Daily  . rosuvastatin  40 mg Oral QHS    Allergies:  Allergies  Allergen Reactions  . Erythromycin Itching  . Latex Itching    Social History    Socioeconomic History  . Marital status: Married    Spouse name: Not on file  . Number of children: Not on file  . Years of education: Not on file  . Highest education level: Not on file  Occupational History  . Occupation: Data processing manager  Social Needs  . Financial resource strain: Not on file  . Food insecurity:    Worry: Not on file    Inability: Not on file  . Transportation needs:    Medical: Not on file    Non-medical: Not on file  Tobacco Use  . Smoking status: Never Smoker  . Smokeless tobacco: Never Used  Substance and Sexual Activity  . Alcohol use: No    Alcohol/week: 0.0 oz    Comment: rare   . Drug use: No  . Sexual activity: Not Currently  Lifestyle  . Physical activity:    Days per week: Not on file    Minutes per session: Not on file  . Stress: Not on file  Relationships  . Social connections:    Talks on phone: Not on file    Gets together: Not on file    Attends religious service: Not on file    Active member of club or organization: Not on file    Attends meetings of clubs or organizations: Not on file    Relationship status: Not on file  . Intimate partner violence:    Fear of current or ex partner: Not on file    Emotionally abused: Not on file    Physically abused: Not on file    Forced sexual activity: Not on file  Other Topics Concern  . Not on file  Social History Narrative  . Not on file     Family History  Problem Relation Age of Onset  . Stroke Father        48s  . Heart attack Father 78  . COPD Mother   . Heart failure Brother   . Cancer Maternal Grandmother        unknown   . COPD Unknown   . Heart failure Maternal Grandfather   . Hypertension Maternal Grandfather   . Cancer Paternal Grandfather        lung  . Diabetes Paternal Grandmother       Review of Systems: All other systems reviewed and are otherwise negative except as noted above.  Physical Exam: Vitals:   07/12/17 1838 07/13/17 0032 07/13/17 0446  07/13/17 0832  BP: (!) 159/63 (!) 183/72 (!) 166/63 (!) 183/68  Pulse: 82 83 84 83  Resp: Temp: 97.6 F (36.4 C) 97.6 F (36.4 C) 98.3 F (36.8 C) 98.2 F (36.8 C)  TempSrc: Oral Oral Oral   SpO2: 97% 100% 98% 99%    GEN- The patient is well appearing, +expressive aphasia  Head- normocephalic, atraumatic  Eyes-  Sclera clear, conjunctiva pink Ears- hearing intact Oropharynx- clear Neck- supple Lungs- Clear to ausculation bilaterally, normal work of breathing Heart- Regular rate and rhythm  GI- soft, NT, ND, + BS Extremities- no clubbing, cyanosis, or edema MS- no significant deformity or atrophy Skin- no rash or lesion Psych- euthymic mood, full affect   Labs:   Lab Results  Component Value Date   WBC 4.0 07/13/2017   HGB 9.1 (L) 07/13/2017   HCT 28.9 (L) 07/13/2017   MCV 90.3 07/13/2017   PLT 233 07/13/2017    Recent Labs  Lab 07/11/17 1717  07/13/17 0426  NA 142   < > 143  K 4.2   < > 3.6  CL 108   < > 107  CO2 27  --  26  BUN 24*   < > 13  CREATININE 1.12*   < > 0.97  CALCIUM 9.7  --  9.4  PROT 6.7  --   --   BILITOT 0.6  --   --   ALKPHOS 66  --   --   ALT 11*  --   --   AST 16  --   --   GLUCOSE 145*   < > 128*   < > = values in this interval not displayed.     Radiology/Studies: Ct Head Wo Contrast  Result Date: 07/08/2017 CLINICAL DATA:  Weakness and slurred speech for 1 day EXAM: CT HEAD WITHOUT CONTRAST TECHNIQUE: Contiguous axial images were obtained from the base of the skull through the vertex without intravenous contrast. COMPARISON:  04/27/2017 FINDINGS: Brain: Old left PCA infarct is identified. Scattered lacunar infarcts are again identified and stable. No findings to suggest acute hemorrhage, acute infarction or space-occupying lesion are noted. Vascular: No hyperdense vessel or unexpected calcification. Skull: Normal. Negative for fracture or focal lesion. Sinuses/Orbits: No acute finding. Other: None. IMPRESSION: Chronic  ischemic changes are noted.  No acute abnormality noted. Electronically Signed   By: Alcide Clever M.D.   On: 07/08/2017 12:37   Mr Brain Wo Contrast (neuro Protocol)  Result Date: 07/11/2017 CLINICAL DATA:  Initial evaluation for worsening right-sided weakness with slurred speech, recent stroke. EXAM: MRI HEAD WITHOUT CONTRAST TECHNIQUE: Multiplanar, multiecho pulse sequences of the brain and surrounding structures were obtained without intravenous contrast. COMPARISON:  Prior MRI from 07/09/2017. FINDINGS: Brain: Age-related cerebral atrophy with chronic small vessel ischemic disease again noted. Encephalomalacia with gliosis within the parasagittal left parietal lobe consistent with remote ischemic infarct. Small remote lacunar infarcts present within the bilateral basal ganglia/corona radiata as well as the left thalamus. Chronic hemorrhagic blood products present about several of these infarcts. Continued interval evolution of recently identified small ischemic infarcts involving the left periventricular white matter, relatively stable in size and distribution as compared to previous. No evidence for hemorrhagic transformation or significant mass effect. There are several small areas of new acute infarction involving the cortical gray matter and underlying periventricular white matter of the parasagittal right frontal lobe (series 3, image 34, 32). Largest area of infarct measures 12 mm. No associated hemorrhage or mass effect. No other areas of new or interval infarction. Gray-white matter differentiation otherwise maintained. No mass lesion, midline shift or mass effect. No hydrocephalus. No extra-axial fluid collection. Major dural sinuses are grossly patent. Pituitary gland suprasellar region normal. Midline structures intact. Vascular: Major intracranial vascular flow voids are maintained at the skull base. Skull and upper cervical spine: Craniocervical junction normal. Upper cervical spine normal. Bone  marrow signal intensity within normal limits. Hyperostosis frontalis interna noted. No scalp soft tissue abnormality. Sinuses/Orbits: Globes and orbital soft tissues within normal limits. Patient status post lens extraction bilaterally. Mild scattered mucosal thickening within the ethmoidal air cells and maxillary sinuses. No air-fluid level to suggest acute sinusitis. No mastoid effusion. Inner ear structures normal. Other: None. IMPRESSION: 1. Few small volume new acute ischemic infarcts involving the parasagittal cortical and subcortical/periventricular left frontal lobe, new relative to recent MRI from 07/09/2017. No associated hemorrhage or mass effect. 2. Normal expected interval evolution of recently identified small volume left-sided infarcts, relatively stable in size and distribution as compared to previous. 3. Otherwise stable appearance of the brain with chronic atrophy and small vessel ischemic disease, with multiple scattered remote infarcts as above. Electronically Signed   By: Rise Mu M.D.   On: 07/11/2017 22:44   Mr Brain Wo Contrast  Result Date: 07/09/2017 CLINICAL DATA:  Stroke aphasia.  Right-sided weakness EXAM: MRI HEAD WITHOUT CONTRAST MRA HEAD WITHOUT CONTRAST TECHNIQUE: Multiplanar, multiecho pulse sequences of the brain and surrounding structures were obtained without intravenous contrast. Angiographic images of the head were obtained using MRA technique without contrast. COMPARISON:  CT head one thousand nineteen FINDINGS: MRI HEAD FINDINGS Brain: Small areas of acute infarction in the left corona radiata and left frontal white matter. Chronic infarct left occipital parietal lobe. Chronic microvascular ischemic change in the white matter bilaterally. Negative for hemorrhage mass or edema. Vascular: Normal arterial flow voids Skull and upper cervical spine: Negative Sinuses/Orbits: Mucosal edema right maxillary sinus. Bilateral cataract removal Other: None MRA HEAD  FINDINGS Image quality degraded by motion Moderately severe stenosis distal vertebral artery bilaterally. Mild to moderate stenosis proximal basilar. Cerebellar arteries not well seen due to motion. Severe stenosis proximal right posterior cerebral artery. Occlusion mid left posterior cerebral artery. Fetal origin left posterior cerebral artery. Atherosclerotic irregularity and mild moderate stenosis in the cavernous carotid bilaterally. Diffuse atherosclerotic disease and middle cerebral arteries bilaterally. Both anterior cerebral arteries are patent. IMPRESSION: Small areas of acute infarct in the deep white matter on the left Moderate chronic ischemic changes in the white matter and left occipital parietal lobe. MRA degraded by motion. Diffuse intracranial atherosclerotic disease. Electronically Signed   By: Marlan Palau M.D.   On: 07/09/2017 12:31   US Carotid Bilateral (at [facility] And [facility] Only)  Result Date: 07/09/2017 CLINICAL DATA:  52 year old female with a history of right-sided cerebrovascular accident EXAM: BILATERAL CAROTID DUPLEX ULTRASOUND TECHNIQUE: Wallace Cullens scale imaging, color Doppler and duplex ultrasound were performed of bilateral carotid and vertebral arteries in the neck. COMPARISON:  Prior duplex carotid ultrasound 10/02/2016 FINDINGS: Criteria: Quantification of carotid stenosis is based on velocity parameters that correlate the residual internal carotid diameter with NASCET-based stenosis levels, using the diameter of the distal internal carotid lumen as the denominator for stenosis measurement. The following velocity measurements were obtained: RIGHT ICA: 115/30 cm/sec CCA: 86/15 cm/sec SYSTOLIC ICA/CCA RATIO:  1.0 ECA:  93 cm/sec LEFT ICA: 98/30 cm/sec CCA: 86/16 cm/sec SYSTOLIC ICA/CCA RATIO:  1.1 ECA:  122 cm/sec RIGHT CAROTID ARTERY: Moderate heterogeneous and slightly irregular atherosclerotic plaque in the proximal internal carotid artery. By peak systolic velocity criteria, the  estimated stenosis remains less than 50%. RIGHT VERTEBRAL ARTERY:  Patent with normal antegrade flow. LEFT CAROTID ARTERY: Trace heterogeneous and relatively smooth atherosclerotic plaque in the proximal internal carotid artery. By peak systolic velocity criteria, the estimated stenosis remains less than 50%. LEFT VERTEBRAL ARTERY:  Patent with  normal antegrade flow. IMPRESSION: 1. Mild (1-49%) stenosis proximal right internal carotid artery secondary to moderate heterogeneous and slightly irregular atherosclerotic plaque. 2. Mild (1-49%) stenosis proximal left internal carotid artery secondary to minimal heterogeneous but smooth atherosclerotic plaque. 3. Vertebral arteries are patent with normal antegrade flow. Signed, Sterling Big, MD [facility] Radiology Electronically Signed   By: Malachy Moan M.D.   On: 07/09/2017 09:05   Mr Maxine Glenn Head/brain ZO Cm  Result Date: 07/09/2017 CLINICAL DATA:  Stroke aphasia.  Right-sided weakness EXAM: MRI HEAD WITHOUT CONTRAST MRA HEAD WITHOUT CONTRAST TECHNIQUE: Multiplanar, multiecho pulse sequences of the brain and surrounding structures were obtained without intravenous contrast. Angiographic images of the head were obtained using MRA technique without contrast. COMPARISON:  CT head one thousand nineteen FINDINGS: MRI HEAD FINDINGS Brain: Small areas of acute infarction in the left corona radiata and left frontal white matter. Chronic infarct left occipital parietal lobe. Chronic microvascular ischemic change in the white matter bilaterally. Negative for hemorrhage mass or edema. Vascular: Normal arterial flow voids Skull and upper cervical spine: Negative Sinuses/Orbits: Mucosal edema right maxillary sinus. Bilateral cataract removal Other: None MRA HEAD FINDINGS Image quality degraded by motion Moderately severe stenosis distal vertebral artery bilaterally. Mild to moderate stenosis proximal basilar. Cerebellar  arteries not well seen due to motion. Severe stenosis proximal right posterior cerebral artery. Occlusion mid left posterior cerebral artery. Fetal origin left posterior cerebral artery. Atherosclerotic irregularity and mild moderate stenosis in the cavernous carotid bilaterally. Diffuse atherosclerotic disease and middle cerebral arteries bilaterally. Both anterior cerebral arteries are patent. IMPRESSION: Small areas of acute infarct in the deep white matter on the left Moderate chronic ischemic changes in the white matter and left occipital parietal lobe. MRA degraded by motion. Diffuse intracranial atherosclerotic disease. Electronically Signed   By: Marlan Palau M.D.   On: 07/09/2017 12:31    12-lead ECG SR (personally reviewed) All prior EKG's in EPIC reviewed with no documented atrial fibrillation  Telemetry SR, 9 beat run NCT (personally reviewed)  Assessment and Plan:  1. Cryptogenic stroke The patient presents with cryptogenic stroke. TEE previously without cause identified.  She has previously worn 30 day monitor without AF found.  I spoke at length with the patient and son-in-law about monitoring for afib with an implantable loop recorder.  Risks, benefits, and alteratives to implantable loop recorder were discussed with the patient today.   At this time, the patient is very clear in their decision to proceed with implantable loop recorder.   Wound care was reviewed with the patient (keep incision clean and dry for 3 days).  Wound check scheduled and entered in AVS.  Please call with questions.   Gypsy Balsam, NP 07/13/2017 9:52 AM  I have seen, examined the patient, and reviewed the above assessment and plan.  Changes to above are made where necessary.  On exam, RRR.  Pt with ongoing expressive aphasia.  I agree with Dr Pearlean Brownie that long term monitoring is appropriate to exclude afib as the cause for her stroke.  Risks and benefits to ILR as well as remote monitoring was discussed  with the patient who wishes to proceed.  Co Sign: Hillis Range, MD 07/13/2017 10:20 AM

## 2017-07-13 NOTE — Progress Notes (Signed)
Report given to 4W nurse. All questions were answered.

## 2017-07-13 NOTE — Progress Notes (Signed)
Stroke Team Progress Note     SUBJECTIVE  patient is  alone in the room during my visit today.. She is having significant trouble speaking and communicating and weakness in the right arm.  OBJECTIVE Most recent Vital Signs: Temp: 98.1 F (36.7 C) (04/30 1147) Temp Source: Oral (04/30 0446) BP: 157/68 (04/30 1147) Pulse Rate: 84 (04/30 1147) Respiratory Rate: 18 O2 Saturdation: 100%  CBG (last 3)  Recent Labs    07/12/17 2114 07/13/17 0050 07/13/17 0611  GLUCAP 113* 119* 123*    Diet:  Diet Order           DIET - DYS 1 Room service appropriate? Yes; Fluid consistency: Thin  Diet effective now           liquids  Activity: bedrest VTE Prophylaxis:  SCDs Studies: Results for orders placed or performed during the hospital encounter of 07/11/17 (from the past 24 hour(s))  CBG monitoring, ED     Status: Abnormal   Collection Time: 07/12/17  5:05 PM  Result Value Ref Range   Glucose-Capillary 167 (H) 65 - 99 mg/dL  Glucose, capillary     Status: Abnormal   Collection Time: 07/12/17  6:53 PM  Result Value Ref Range   Glucose-Capillary 209 (H) 65 - 99 mg/dL  HIV antibody (Routine Testing)     Status: None   Collection Time: 07/12/17  7:05 PM  Result Value Ref Range   HIV Screen 4th Generation wRfx Non Reactive Non Reactive  Iron and TIBC     Status: Abnormal   Collection Time: 07/12/17  7:05 PM  Result Value Ref Range   Iron 42 28 - 170 ug/dL   TIBC 045 (L) 409 - 811 ug/dL   Saturation Ratios 18 10.4 - 31.8 %   UIBC 197 ug/dL  Reticulocytes     Status: Abnormal   Collection Time: 07/12/17  7:05 PM  Result Value Ref Range   Retic Ct Pct 0.7 0.4 - 3.1 %   RBC. 3.25 (L) 3.87 - 5.11 MIL/uL   Retic Count, Absolute 22.8 19.0 - 186.0 K/uL  Glucose, capillary     Status: Abnormal   Collection Time: 07/12/17  9:14 PM  Result Value Ref Range   Glucose-Capillary 113 (H) 65 - 99 mg/dL   Comment 1 Notify RN    Comment 2 Document in Chart   Glucose, capillary     Status:  Abnormal   Collection Time: 07/13/17 12:50 AM  Result Value Ref Range   Glucose-Capillary 119 (H) 65 - 99 mg/dL   Comment 1 Notify RN    Comment 2 Document in Chart   CBC     Status: Abnormal   Collection Time: 07/13/17  4:26 AM  Result Value Ref Range   WBC 4.0 4.0 - 10.5 K/uL   RBC 3.20 (L) 3.87 - 5.11 MIL/uL   Hemoglobin 9.1 (L) 12.0 - 15.0 g/dL   HCT 91.4 (L) 78.2 - 95.6 %   MCV 90.3 78.0 - 100.0 fL   MCH 28.4 26.0 - 34.0 pg   MCHC 31.5 30.0 - 36.0 g/dL   RDW 21.3 08.6 - 57.8 %   Platelets 233 150 - 400 K/uL  Basic metabolic panel     Status: Abnormal   Collection Time: 07/13/17  4:26 AM  Result Value Ref Range   Sodium 143 135 - 145 mmol/L   Potassium 3.6 3.5 - 5.1 mmol/L   Chloride 107 101 - 111 mmol/L   CO2 26 22 -  32 mmol/L   Glucose, Bld 128 (H) 65 - 99 mg/dL   BUN 13 6 - 20 mg/dL   Creatinine, Ser 9.81 0.44 - 1.00 mg/dL   Calcium 9.4 8.9 - 19.1 mg/dL   GFR calc non Af Amer >60 >60 mL/min   GFR calc Af Amer >60 >60 mL/min   Anion gap 10 5 - 15  Glucose, capillary     Status: Abnormal   Collection Time: 07/13/17  6:11 AM  Result Value Ref Range   Glucose-Capillary 123 (H) 65 - 99 mg/dL   Comment 1 Notify RN    Comment 2 Document in Chart      Mr Brain Wo Contrast (neuro Protocol)  Result Date: 07/11/2017 CLINICAL DATA:  Initial evaluation for worsening right-sided weakness with slurred speech, recent stroke. EXAM: MRI HEAD WITHOUT CONTRAST TECHNIQUE: Multiplanar, multiecho pulse sequences of the brain and surrounding structures were obtained without intravenous contrast. COMPARISON:  Prior MRI from 07/09/2017. FINDINGS: Brain: Age-related cerebral atrophy with chronic small vessel ischemic disease again noted. Encephalomalacia with gliosis within the parasagittal left parietal lobe consistent with remote ischemic infarct. Small remote lacunar infarcts present within the bilateral basal ganglia/corona radiata as well as the left thalamus. Chronic hemorrhagic blood  products present about several of these infarcts. Continued interval evolution of recently identified small ischemic infarcts involving the left periventricular white matter, relatively stable in size and distribution as compared to previous. No evidence for hemorrhagic transformation or significant mass effect. There are several small areas of new acute infarction involving the cortical gray matter and underlying periventricular white matter of the parasagittal right frontal lobe (series 3, image 34, 32). Largest area of infarct measures 12 mm. No associated hemorrhage or mass effect. No other areas of new or interval infarction. Gray-white matter differentiation otherwise maintained. No mass lesion, midline shift or mass effect. No hydrocephalus. No extra-axial fluid collection. Major dural sinuses are grossly patent. Pituitary gland suprasellar region normal. Midline structures intact. Vascular: Major intracranial vascular flow voids are maintained at the skull base. Skull and upper cervical spine: Craniocervical junction normal. Upper cervical spine normal. Bone marrow signal intensity within normal limits. Hyperostosis frontalis interna noted. No scalp soft tissue abnormality. Sinuses/Orbits: Globes and orbital soft tissues within normal limits. Patient status post lens extraction bilaterally. Mild scattered mucosal thickening within the ethmoidal air cells and maxillary sinuses. No air-fluid level to suggest acute sinusitis. No mastoid effusion. Inner ear structures normal. Other: None. IMPRESSION: 1. Few small volume new acute ischemic infarcts involving the parasagittal cortical and subcortical/periventricular left frontal lobe, new relative to recent MRI from 07/09/2017. No associated hemorrhage or mass effect. 2. Normal expected interval evolution of recently identified small volume left-sided infarcts, relatively stable in size and distribution as compared to previous. 3. Otherwise stable appearance of  the brain with chronic atrophy and small vessel ischemic disease, with multiple scattered remote infarcts as above. Electronically Signed   By: Rise Mu M.D.   On: 07/11/2017 22:44    Physical Exam:    Present middle-age Caucasian lady currently not in distress. She appears frustrated. She has some emotional lability. . Afebrile. Head is nontraumatic. Neck is supple without bruit.    Cardiac exam no murmur or gallop. Lungs are clear to auscultation. Distal pulses are well felt. Neurological Exam : Awake alert globally aphasic with hesitant and nonfluent speech and speaks only few words and occasional short sentences. Able to follow only simple midline and few 1 step commands.decreased blink to threat on  the right compared to the left. Right lower facial weakness. Tongue midline. Pseudobulbar facies with decreased facial expression.. Brisk jaw jerk. Motor system exam reveals right upper extremity weakness with on the 2-3/5 strength in the right approximately good antigravity strength in the left upper extremity. Able to move left lower extremity off the bed but does have right lower extremity weakness but can do drop briskly to pain. Not cooperative for detailed testing of strength in the lower extremities. Sensation appears preserved and reacts to pain in all 4 extremities. Tendon reflexes are brisk. Plantars downgoing. Not cooperative for testing cerebellar function. Gait deferred. ASSESSMENT Kelly Romero is a 52 y.o. female with multiple strokes over the last few years in different vascular distributions of both cortical and subcortical involvement with multiple uncontrolled risk factors as well as findings of advanced intracranial atherosclerosis.  Vascular risk factors of diabetes, hypertension, hyperlipidemia,prior cerebrovascular disease and intracranial atherosclerosis. Negative prior workup so far :TEE 01/14/15 was negative. Transcranial Doppler bubble studies on 12/30/2015  showed few high intensity transient signals suggestive of likely only a trivial PFO Spinal tap 12/28/15 was unremarkable.hypercoagulable panel, HIV and RPR on 12/28/15 were also unremarkable. Hospital day # 2  TREATMENT/PLAN  Continue dual antiplatelet therapy for 3 months given the presence of advanced intracranial atherosclerosis.Prior lab work for vasculitis and hypercoagulable state,TEE and spinal tap have all been negative. Recommend  loop recorder insertion .  Delia Heady, MD [facility] Pager: [phone] 07/13/2017 1:58 PM

## 2017-07-13 NOTE — Progress Notes (Signed)
Occupational Therapy Treatment Patient Details Name: Kelly Romero MRN: [id number] DOB: [birth year] Today's Date: 07/13/2017    History of present illness Pt. is a 52 y.o. female who was admitted to [facility] with Aphasia, RUE weakness, slurred speech, and difficulty with word finding. Pt. has a PMHx of CVA, TIA, DM, Headache, Hyperlipidemia, HTN, TIA, Appendectomy, Cholecystectomy, Hernia Repair. She went home with family and had worsening of speech and returned to [facility] ED. MRI showed new L frontal lobe infarcts compared to MRI on 07/09/17.    OT comments  Pt seen today to further assess appropriate DC recommendation.  Continue to recommend CIR for rehab to maximize functional level fo independence and facilitate safe DC home.  Pt with complaints of "dizziness" and family reports falls are due to "dizziness". Pt verbalizes sensation as room spinning which occurs with movement.  Pt required +2 min A with mobility @ RW level when fatigued. Mod A with ADL. Apparent cognitive and visual/perceptual deficits. Consistently bumping into objects/walls on R side.Recommend Vestibular evaluation to rule out BPPV. Will continue to follow acutely.   Follow Up Recommendations  Supervision/Assistance - 24 hour    Equipment Recommendations  3 in 1 bedside commode    Recommendations for Other Services Rehab consult  PT Vestibular Evaluation    Precautions / Restrictions Precautions Precautions: Fall Restrictions Weight Bearing Restrictions: No       Mobility Bed Mobility Overal bed mobility: Needs Assistance Bed Mobility: Supine to Sit     Supine to sit: Supervision     General bed mobility comments: supervision for safety  Transfers Overall transfer level: Needs assistance Equipment used: Rolling walker (2 wheeled) Transfers: Sit to/from Stand Sit to Stand: Min assist         General transfer comment: assist to steady; increased time and effort; cues for safety    Balance Overall  balance assessment: History of Falls;Needs assistance Sitting-balance support: Feet unsupported;Single extremity supported Sitting balance-Leahy Scale: Fair Sitting balance - Comments: single UE support needed when reaching outside BOS     Standing balance-Leahy Scale: Poor Standing balance comment: dependent on external support                           ADL either performed or assessed with clinical judgement   ADL Overall ADL's : Needs assistance/impaired     Grooming: Minimal assistance   Upper Body Bathing: Minimal assistance   Lower Body Bathing: Moderate assistance;Sit to/from stand   Upper Body Dressing : Moderate assistance   Lower Body Dressing: Moderate assistance;Sit to/from stand       Toileting- Architect and Hygiene: Maximal assistance Toileting - Clothing Manipulation Details (indicate cue type and reason): incontinent; wears briefs at baseline     Functional mobility during ADLs: Minimal assistance;Rolling walker;+2 for physical assistance General ADL Comments: As pt fatigues, requires increaed assistance and is at increased risk for falls; bumping into items consistently on R; complainting of dizziness     Vision   Additional Comments: will further assess; ? R field deficit; + nystagmus, especially in R gaze   Perception     Praxis      Cognition Arousal/Alertness: Awake/alert Behavior During Therapy: Flat affect Overall Cognitive Status: Impaired/Different from baseline Area of Impairment: Attention;Memory;Following commands;Safety/judgement;Problem solving;Awareness                   Current Attention Level: Sustained Memory: Decreased short-term memory Following Commands: Follows one step commands consistently  Safety/Judgement: Decreased awareness of safety;Decreased awareness of deficits Awareness: Emergent Problem Solving: Slow processing General Comments: Poor spatial  awareness of R ; R bias in positioning  of self in RW        Exercises     Shoulder Instructions       General Comments complains of "room spinning"    Pertinent Vitals/ Pain       Pain Assessment: No/denies pain  Home Living                                          Prior Functioning/Environment              Frequency  Min 2X/week        Progress Toward Goals  OT Goals(current goals can now be found in the care plan section)  Progress towards OT goals: Progressing toward goals  Acute Rehab OT Goals Patient Stated Goal: to get better OT Goal Formulation: With patient Time For Goal Achievement: 07/26/17 Potential to Achieve Goals: Good ADL Goals Pt Will Perform Grooming: Independently Pt Will Perform Upper Body Bathing: with set-up;sitting Pt Will Perform Lower Body Bathing: with set-up;with supervision;sit to/from stand Pt Will Perform Lower Body Dressing: with supervision Pt Will Transfer to Toilet: with modified independence;bedside commode;ambulating Pt Will Perform Toileting - Clothing Manipulation and hygiene: with modified independence  Plan Discharge plan remains appropriate    Co-evaluation    PT/OT/SLP Co-Evaluation/Treatment: Yes Reason for Co-Treatment: Necessary to address cognition/behavior during functional activity;For patient/therapist safety PT goals addressed during session: Mobility/safety with mobility        AM-PAC PT "6 Clicks" Daily Activity     Outcome Measure   Help from another person eating meals?: A Little Help from another person taking care of personal grooming?: A Little Help from another person toileting, which includes using toliet, bedpan, or urinal?: A Lot Help from another person bathing (including washing, rinsing, drying)?: A Lot Help from another person to put on and taking off regular upper body clothing?: A Little Help from another person to put on and taking off regular lower body clothing?: A Lot 6 Click Score: 15    End of  Session Equipment Utilized During Treatment: Gait belt;Rolling walker  OT Visit Diagnosis: Other abnormalities of gait and mobility (R26.89);Muscle weakness (generalized) (M62.81);History of falling (Z91.81);Other symptoms and signs involving cognitive function;Hemiplegia and hemiparesis Hemiplegia - Right/Left: Right Hemiplegia - dominant/non-dominant: Dominant Hemiplegia - caused by: Cerebral infarction   Activity Tolerance Patient tolerated treatment well   Patient Left in chair;with call bell/phone within reach;with chair alarm set;with family/visitor present   Nurse Communication Mobility status        Time: 1610-9604 OT Time Calculation (min): 25 min  Charges: OT General Charges $OT Visit: 1 Visit OT Treatments $Self Care/Home Management : 8-22 mins  [facility], OT/L  [phone] 07/13/2017   Kelly Romero,Kelly Romero 07/13/2017, 1:10 PM

## 2017-07-13 NOTE — Progress Notes (Signed)
Marcello Fennel, MD      Marcello Fennel, MD  Physician  Physical Medicine and Rehabilitation      Consult Note  Signed     Date of Service:  07/12/2017  6:54 AM         Related encounter: ED to Hosp-Admission (Discharged) from 07/11/2017 in [facility]             Signed          Expand All Collapse All            Expand widget buttonCollapse widget button    Show:Clear all   ManualTemplateCopied  Added by:     Angiulli, Mcarthur Rossetti, PA-C  Marcello Fennel, MD   Hover for detailscustomization button                                                                                                                                                       untitled image              Physical Medicine and Rehabilitation Consult  Reason for Consult: Aphasia with right-sided weakness  Referring Physician: [facility]        HPI: CHLOE MIYOSHI is a 52 y.o. right-handed female with history of insulin-dependent diabetes mellitus, hyperlipidemia, CVA, hypertension.  Per chart review and patient, patient lives with daughter and her family.  She states she needed assistance with all ADLs prior to admission. One level home with 4 steps to entry.  Family assistance as needed.  Patient with recent discharge from [facility] 07/10/2017 for acute infarct deep white matter on the left.  Maintained on aspirin and Plavix for CVA prophylaxis.  Carotid Dopplers at that time showed no evidence of stenosis.  Echocardiogram without PFO.  She was discharged to home.  Presented 07/12/2017 with increasing right-sided weakness facial droop and aphasia.  Urine drug screen negative.  MRI reviewed, showing left frontal lobe infarcts. Per report, few small volume new acute  ischemic infarcts involving the parasagittal cortical and subcortical periventricular left frontal lobe, new relative to most recent MRI 07/09/2017.  No associated hemorrhage or mass-effect.  Normal expected interval evolution of recently identified small volume left sided infarcts.  Neurology follow-up patient currently remains on aspirin and Plavix for CVA prophylaxis.  Subtends Lovenox for DVT prophylaxis.  Dysphagia #1 thin liquid diet.  Physical therapy evaluation completed with recommendations of physical medicine rehab consult.        Review of Systems   Constitutional: Negative for chills and fever.   HENT: Negative for hearing loss.    Eyes: Negative for blurred vision and double vision.   Respiratory: Negative for cough and shortness of breath.  Cardiovascular: Negative for chest pain, palpitations and leg swelling.   Gastrointestinal: Positive for constipation. Negative for nausea.   Genitourinary: Negative for dysuria, flank pain and hematuria.   Musculoskeletal: Positive for myalgias.   Skin: Negative for rash.   Neurological: Positive for speech change, focal weakness and headaches.   All other systems reviewed and are negative.          Past Medical History:    Diagnosis   Date    .   Diabetes mellitus without complication (HCC)        .   Headache        .   Hyperlipidemia        .   Hypertension        .   Stroke (HCC)        .   TIA (transient ischemic attack)                 Past Surgical History:    Procedure   Laterality   Date    .   APPENDECTOMY            .   CHOLECYSTECTOMY            .   FOOT GANGLION EXCISION            .   HERNIA REPAIR            .   IR GENERIC HISTORICAL       12/30/2015        IR ANGIO INTRA EXTRACRAN SEL COM CAROTID INNOMINATE BILAT MOD SED 12/30/2015 Julieanne Cotton, MD [facility]    .   IR GENERIC HISTORICAL        12/30/2015        IR ANGIO VERTEBRAL SEL VERTEBRAL BILAT MOD SED 12/30/2015 Julieanne Cotton, MD [facility]    .   TEE WITHOUT CARDIOVERSION   N/A   04/19/2015        Procedure: TRANSESOPHAGEAL ECHOCARDIOGRAM (TEE);  Surgeon: Jake Bathe, MD;  Location: [facility] ENDOSCOPY;  Service: Cardiovascular;  Laterality: N/A;    .   VAGINAL HYSTERECTOMY                     Family History    Problem   Relation   Age of Onset    .   Stroke   Father                108s    .   Heart attack   Father   41    .   COPD   Mother        .   Heart failure   Brother        .   Cancer   Maternal Grandmother                unknown     .   COPD   Unknown        .   Heart failure   Maternal Grandfather        .   Hypertension   Maternal Grandfather        .   Cancer   Paternal Grandfather                lung    .   Diabetes   Paternal Grandmother           Social History:  reports that she has  never smoked. She has never used smokeless tobacco. She reports that she does not drink alcohol or use drugs.  Allergies:        Allergies    Allergen   Reactions    .   Erythromycin   Itching    .   Latex   Itching              Medications Prior to Admission    Medication   Sig   Dispense   Refill    .   aspirin EC 81 MG tablet   Take 1 tablet by mouth every morning.            .   clopidogrel (PLAVIX) 75 MG tablet   Take 1 tablet (75 mg total) by mouth daily.   30 tablet   11    .   Coenzyme Q10 (CO Q-10) 200 MG CAPS   Take 200 mg by mouth every evening.            Marland Kitchen   glimepiride (AMARYL) 2 MG tablet   Take 2 mg by mouth daily with breakfast.            .   insulin detemir (LEVEMIR) 100 UNIT/ML injection   Inject 40 Units into the skin daily before breakfast.             .   lisinopril  (PRINIVIL,ZESTRIL) 40 MG tablet   Take 1 tablet (40 mg total) by mouth daily.   30 tablet   3    .   Magnesium 500 MG TABS   Take 500 mg by mouth every evening.             .   Riboflavin (B2) 100 MG TABS   Take 200 mg by mouth every morning.            .   rosuvastatin (CRESTOR) 40 MG tablet   Take 1 tablet (40 mg total) by mouth at bedtime.   30 tablet   3    .   divalproex (DEPAKOTE) 500 MG DR tablet   Take 1 tablet (500 mg total) by mouth 2 (two) times daily. (Patient not taking: Reported on 07/11/2017)   60 tablet   3          Home:  Home Living  Family/patient expects to be discharged to:: Private residence  Living Arrangements: Children  Available Help at Discharge: Available 24 hours/day, Family  Type of Home: House  Home Access: Stairs to enter  Secretary/administrator of Steps: 4  Entrance Stairs-Rails: Can reach both  Home Layout: One level  Bathroom Shower/Tub: Medical sales representative: Standard  Home Equipment: Environmental consultant - 4 wheels, Shower seat  Additional Comments: pt lives with her daughter and her family. She has a lift chair at home in additon to equipment above   Functional History:  Prior Function  Level of Independence: Needs assistance  Gait / Transfers Assistance Needed: prior to admit to [facility], pt was ambulating with rollator  ADL's / Homemaking Assistance Needed: prior to admit to [facility], daughter was helping with bathing, dressing, and home mgmt  Functional Status:   Mobility:  Bed Mobility  Overal bed mobility: Needs Assistance  Bed Mobility: Supine to Sit, Sit to Supine  Supine to sit: Supervision  Sit to supine: Min assist  General bed mobility comments: pt able to get to EOB with use of rail but no physical assist but increased time needed  and pt giving max effort. Min A for LE"s into bed with return to sitting and min A to position in center of bed  Transfers  Overall  transfer level: Needs assistance  Transfers: Sit to/from Stand, Stand Pivot Transfers  Sit to Stand: Min assist  Stand pivot transfers: Min assist   Lateral/Scoot Transfers: Mod assist  General transfer comment: pt on gurney in ED, unsafe to attempt standing because pt's feet could not reach floor when sitting EOB. Scooted to R in sitting, required mod A for hip motion and support  Ambulation/Gait  General Gait Details: unable to test in current environment       ADL:  ADL  Overall ADL's : Needs assistance/impaired  Grooming: Minimal assistance, Sitting  Upper Body Bathing: Minimal assistance, Sitting  Lower Body Bathing: Moderate assistance, Sit to/from stand  Upper Body Dressing : Moderate assistance, Sitting  Lower Body Dressing: Moderate assistance, Sit to/from stand  Toilet Transfer: Stand-pivot, Minimal assistance, BSC  Toileting- Clothing Manipulation and Hygiene: Maximal assistance  Toileting - Clothing Manipulation Details (indicate cue type and reason): incontinent  Functional mobility during ADLs: Minimal assistance, Cueing for safety, Cueing for sequencing     Cognition:  Cognition  Overall Cognitive Status: No family/caregiver present to determine baseline cognitive functioning  Orientation Level: Oriented X4  Cognition  Arousal/Alertness: Awake/alert  Behavior During Therapy: Flat affect(tearful)  Overall Cognitive Status: No family/caregiver present to determine baseline cognitive functioning  Area of Impairment: Following commands, Memory, Safety/judgement, Awareness, Attention, Problem solving, Orientation  Orientation Level: Disoriented to, Time("August 2002")  Current Attention Level: Sustained  Memory: Decreased short-term memory  Following Commands: Follows one step commands with increased time  Safety/Judgement: Decreased awareness of safety, Decreased awareness of deficits  Awareness: Emergent  Problem Solving: Slow  processing, Decreased initiation, Difficulty sequencing, Requires verbal cues, Requires tactile cues  General Comments: pt with very slow speech and reports several times during history that she could not remember.      Blood pressure (!) 166/63, pulse 84, temperature 98.3 F (36.8 C), temperature source Oral, resp. rate 18, SpO2 98 %.  Physical Exam   Vitals reviewed.  Constitutional: She appears well-developed.  52 year old obese, right-handed Caucasian female   HENT:  Mild right facial weakness   Eyes:  Pupils reactive to light   Neck: Normal range of motion. Neck supple. No thyromegaly present.   Cardiovascular: Normal rate, regular rhythm and normal heart sounds.   Respiratory: Effort normal and breath sounds normal. No respiratory distress.   GI: Soft. Bowel sounds are normal. She exhibits no distension.  Musculoskeletal:  No edema or tenderness in extremities  Neurological: She is alert.  Patient appears frustrated and tearful.   She did follow simple verbal commands.   Alert and Oriented x2 Motor: 4/5 throughout, no ataxia Dysarthira   Skin: Skin is warm and dry.  Psychiatric: Her speech is delayed and slurred. She exhibits a depressed mood.         Lab Results Last 24 Hours  Imaging Results (Last 48 hours)                    Assessment/Plan:  Diagnosis: Acute ischemic infarcts involving the parasagittal cortical and subcortical periventricular left frontal lobe  Labs and images independently reviewed.  Records reviewed and summated above.  Stroke:  Continue secondary stroke prophylaxis and Risk Factor Modification listed below:    Antiplatelet therapy:    Blood Pressure Management:  Continue current medication with prn's with permisive HTN per primary team  Statin Agent:    Diabetes management:       1.Does the need for close, 24 hr/day medical supervision in concert with the patient's rehab needs make it unreasonable for this patient to be served in a less intensive setting? Potentially    2.Co-Morbidities requiring supervision/potential complications:  DM (Monitor in accordance with exercise and adjust meds as necessary), hyperlipidemia (cont meds), history of CVA, HTN (monitor and provide prns in accordance with increased physical exertion and pain), post-stroke dysphagia (advance diet as tolerated), ABLA (transfuse if necessary to ensure appropriate perfusion for increased activity tolerance), morbid obesity (encourage weight loss)   3.Due to safety, disease management, medication administration and patient education, does the patient require 24  hr/day rehab nursing? Yes   4.Does the patient require coordinated care of a physician, rehab nurse, PT (1-2 hrs/day, 5 days/week), OT (1-2 hrs/day, 5 days/week) and SLP (1-2 hrs/day, 5 days/week) to address physical and functional deficits in the context of the above medical diagnosis(es)? Yes Addressing deficits in the following areas: balance, endurance, locomotion, strength, transferring, bathing, dressing, toileting, cognition, speech, swallowing and psychosocial support   5.Can the patient actively participate in an intensive therapy program of at least 3 hrs of therapy per day at least 5 days per week? Potentially   6.The potential for patient to make measurable gains while on inpatient rehab is excellent   7.Anticipated functional outcomes upon discharge from inpatient rehab are min assist  with PT, min assist with OT, min assist with SLP.   8.Estimated rehab length of stay to reach the above functional goals is: TBD.   9.Anticipated D/C setting: Home   10.Anticipated post D/C treatments: HH therapy and Home excercise program   11.Overall Rehab/Functional Prognosis: good      RECOMMENDATIONS:  This patient's condition is appropriate for continued rehabilitative care in the following setting: Will await more thorough therapy evaluations.  Additionally, will need to inquire further regarding baseline level of functioning as pt states that she required assistance for all ADLs PTA.  If this is the case she is likely at/near her baseline level of functioning.  Patient has agreed to participate in recommended program. Potentially  Note that insurance prior authorization may be required for reimbursement for recommended care.     Comment: Rehab Admissions Coordinator to follow up.        I have personally performed a face to face diagnostic evaluation, including, but not limited to relevant history and physical exam findings, of this patient and developed relevant  assessment and plan.  Additionally, I have reviewed and concur with the physician assistant's documentation above.      Maryla Morrow, MD, ABPMR  Mcarthur Rossetti Angiulli, PA-C  07/13/2017                Revision History  Routing History

## 2017-07-13 NOTE — Interval H&P Note (Signed)
History and Physical Interval Note:  07/13/2017 1:58 PM  Kelly Romero  has presented today for surgery, with the diagnosis of stroke  The various methods of treatment have been discussed with the patient and family. After consideration of risks, benefits and other options for treatment, the patient has consented to  Procedure(s): LOOP RECORDER INSERTION (N/A) as a surgical intervention .  [name] history has been reviewed, patient examined, no change in status, stable for surgery.  I have reviewed the patient's chart and labs.  Questions were answered to the patient's satisfaction.     Hillis Range

## 2017-07-13 NOTE — H&P (View-Only) (Signed)
  ELECTROPHYSIOLOGY CONSULT NOTE  Patient ID: Kelly Romero MRN: [id number], DOB/AGE: 54/02/1966   Admit date: 07/11/2017 Date of Consult: 07/13/2017  Primary Physician: [name], [name], MD Primary Cardiologist: [name] Reason for Consultation: Cryptogenic stroke; recommendations regarding Implantable Loop Recorder  History of Present Illness EP has been asked to evaluate Kelly Romero for placement of an implantable loop recorder to monitor for atrial fibrillation by Dr Sethi.  She has had prior strokes with ILR recommended after extensive work up.  The patient has previously declined ILR implant. She presented to the hospital this admission with .  The patient was admitted on 07/11/2017 with worsening slurred speech and facial droop.  They first developed symptoms while after discharge from [facility].  Imaging demonstrated acute small infarcts in left hemisphere.  she has undergone workup for stroke including echocardiogram and carotid dopplers.  The patient has been monitored on telemetry which has demonstrated sinus rhythm with short run NCT.  She has undergone prior TEE as well as worn 30 day event monitor.  Prior to admission, the patient denies chest pain, shortness of breath, dizziness, palpitations, or syncope.  They are recovering from their stroke with plans to go to CIR at discharge.  Past Medical History:  Diagnosis Date  . Diabetes mellitus without complication (HCC)   . Headache   . Hyperlipidemia   . Hypertension   . Stroke (HCC)   . TIA (transient ischemic attack)      Surgical History:  Past Surgical History:  Procedure Laterality Date  . APPENDECTOMY    . CHOLECYSTECTOMY    . FOOT GANGLION EXCISION    . HERNIA REPAIR    . IR GENERIC HISTORICAL  12/30/2015   IR ANGIO INTRA EXTRACRAN SEL COM CAROTID INNOMINATE BILAT MOD SED 12/30/2015 Sanjeev Deveshwar, MD [facility]  . IR GENERIC HISTORICAL  12/30/2015   IR ANGIO VERTEBRAL SEL VERTEBRAL BILAT MOD SED  12/30/2015 Sanjeev Deveshwar, MD [facility]  . TEE WITHOUT CARDIOVERSION N/A 04/19/2015   Procedure: TRANSESOPHAGEAL ECHOCARDIOGRAM (TEE);  Surgeon: Mark C Skains, MD;  Location: [facility];  Service: Cardiovascular;  Laterality: N/A;  . VAGINAL HYSTERECTOMY       Medications Prior to Admission  Medication Sig Dispense Refill Last Dose  . aspirin EC 81 MG tablet Take 1 tablet by mouth every morning.   07/11/2017 at 0900  . clopidogrel (PLAVIX) 75 MG tablet Take 1 tablet (75 mg total) by mouth daily. 30 tablet 11 07/11/2017 at 0900  . Coenzyme Q10 (CO Q-10) 200 MG CAPS Take 200 mg by mouth every evening.   07/10/2017 at pm  . glimepiride (AMARYL) 2 MG tablet Take 2 mg by mouth daily with breakfast.   07/11/2017 at Unknown time  . insulin detemir (LEVEMIR) 100 UNIT/ML injection Inject 40 Units into the skin daily before breakfast.    07/11/2017 at am  . lisinopril (PRINIVIL,ZESTRIL) 40 MG tablet Take 1 tablet (40 mg total) by mouth daily. 30 tablet 3 07/11/2017 at am  . Magnesium 500 MG TABS Take 500 mg by mouth every evening.    07/10/2017 at pm  . Riboflavin (B2) 100 MG TABS Take 200 mg by mouth every morning.   07/11/2017 at am  . rosuvastatin (CRESTOR) 40 MG tablet Take 1 tablet (40 mg total) by mouth at bedtime. 30 tablet 3 07/10/2017 at pm  . divalproex (DEPAKOTE) 500 MG DR tablet Take 1 tablet (500 mg total) by mouth 2 (two) times daily. (Patient not taking: Reported   on 07/11/2017) 60 tablet 3 Not Taking at Unknown time    Inpatient Medications:  .  stroke: mapping our early stages of recovery book   Does not apply Once  . aspirin EC  81 mg Oral Daily  . clopidogrel  75 mg Oral Daily  . enoxaparin (LOVENOX) injection  40 mg Subcutaneous Daily  . insulin aspart  0-15 Units Subcutaneous Q6H  . insulin detemir  20 Units Subcutaneous Daily  . rosuvastatin  40 mg Oral QHS    Allergies:  Allergies  Allergen Reactions  . Erythromycin Itching  . Latex Itching    Social History    Socioeconomic History  . Marital status: Married    Spouse name: Not on file  . Number of children: Not on file  . Years of education: Not on file  . Highest education level: Not on file  Occupational History  . Occupation: Assistant Teacher  Social Needs  . Financial resource strain: Not on file  . Food insecurity:    Worry: Not on file    Inability: Not on file  . Transportation needs:    Medical: Not on file    Non-medical: Not on file  Tobacco Use  . Smoking status: Never Smoker  . Smokeless tobacco: Never Used  Substance and Sexual Activity  . Alcohol use: No    Alcohol/week: 0.0 oz    Comment: rare   . Drug use: No  . Sexual activity: Not Currently  Lifestyle  . Physical activity:    Days per week: Not on file    Minutes per session: Not on file  . Stress: Not on file  Relationships  . Social connections:    Talks on phone: Not on file    Gets together: Not on file    Attends religious service: Not on file    Active member of club or organization: Not on file    Attends meetings of clubs or organizations: Not on file    Relationship status: Not on file  . Intimate partner violence:    Fear of current or ex partner: Not on file    Emotionally abused: Not on file    Physically abused: Not on file    Forced sexual activity: Not on file  Other Topics Concern  . Not on file  Social History Narrative  . Not on file     Family History  Problem Relation Age of Onset  . Stroke Father        50s  . Heart attack Father 63  . COPD Mother   . Heart failure Brother   . Cancer Maternal Grandmother        unknown   . COPD Unknown   . Heart failure Maternal Grandfather   . Hypertension Maternal Grandfather   . Cancer Paternal Grandfather        lung  . Diabetes Paternal Grandmother       Review of Systems: All other systems reviewed and are otherwise negative except as noted above.  Physical Exam: Vitals:   07/12/17 1838 07/13/17 0032 07/13/17 0446  07/13/17 0832  BP: (!) 159/63 (!) 183/72 (!) 166/63 (!) 183/68  Pulse: 82 83 84 83  Resp: 20 18 18 18  Temp: 97.6 F (36.4 C) 97.6 F (36.4 C) 98.3 F (36.8 C) 98.2 F (36.8 C)  TempSrc: Oral Oral Oral   SpO2: 97% 100% 98% 99%    GEN- The patient is well appearing, +expressive aphasia  Head- normocephalic, atraumatic   Eyes-  Sclera clear, conjunctiva pink Ears- hearing intact Oropharynx- clear Neck- supple Lungs- Clear to ausculation bilaterally, normal work of breathing Heart- Regular rate and rhythm  GI- soft, NT, ND, + BS Extremities- no clubbing, cyanosis, or edema MS- no significant deformity or atrophy Skin- no rash or lesion Psych- euthymic mood, full affect   Labs:   Lab Results  Component Value Date   WBC 4.0 07/13/2017   HGB 9.1 (L) 07/13/2017   HCT 28.9 (L) 07/13/2017   MCV 90.3 07/13/2017   PLT 233 07/13/2017    Recent Labs  Lab 07/11/17 1717  07/13/17 0426  NA 142   < > 143  K 4.2   < > 3.6  CL 108   < > 107  CO2 27  --  26  BUN 24*   < > 13  CREATININE 1.12*   < > 0.97  CALCIUM 9.7  --  9.4  PROT 6.7  --   --   BILITOT 0.6  --   --   ALKPHOS 66  --   --   ALT 11*  --   --   AST 16  --   --   GLUCOSE 145*   < > 128*   < > = values in this interval not displayed.     Radiology/Studies: Ct Head Wo Contrast  Result Date: 07/08/2017 CLINICAL DATA:  Weakness and slurred speech for 1 day EXAM: CT HEAD WITHOUT CONTRAST TECHNIQUE: Contiguous axial images were obtained from the base of the skull through the vertex without intravenous contrast. COMPARISON:  04/27/2017 FINDINGS: Brain: Old left PCA infarct is identified. Scattered lacunar infarcts are again identified and stable. No findings to suggest acute hemorrhage, acute infarction or space-occupying lesion are noted. Vascular: No hyperdense vessel or unexpected calcification. Skull: Normal. Negative for fracture or focal lesion. Sinuses/Orbits: No acute finding. Other: None. IMPRESSION: Chronic  ischemic changes are noted.  No acute abnormality noted. Electronically Signed   By: Mark  Lukens M.D.   On: 07/08/2017 12:37   Mr Brain Wo Contrast (neuro Protocol)  Result Date: 07/11/2017 CLINICAL DATA:  Initial evaluation for worsening right-sided weakness with slurred speech, recent stroke. EXAM: MRI HEAD WITHOUT CONTRAST TECHNIQUE: Multiplanar, multiecho pulse sequences of the brain and surrounding structures were obtained without intravenous contrast. COMPARISON:  Prior MRI from 07/09/2017. FINDINGS: Brain: Age-related cerebral atrophy with chronic small vessel ischemic disease again noted. Encephalomalacia with gliosis within the parasagittal left parietal lobe consistent with remote ischemic infarct. Small remote lacunar infarcts present within the bilateral basal ganglia/corona radiata as well as the left thalamus. Chronic hemorrhagic blood products present about several of these infarcts. Continued interval evolution of recently identified small ischemic infarcts involving the left periventricular white matter, relatively stable in size and distribution as compared to previous. No evidence for hemorrhagic transformation or significant mass effect. There are several small areas of new acute infarction involving the cortical gray matter and underlying periventricular white matter of the parasagittal right frontal lobe (series 3, image 34, 32). Largest area of infarct measures 12 mm. No associated hemorrhage or mass effect. No other areas of new or interval infarction. Gray-white matter differentiation otherwise maintained. No mass lesion, midline shift or mass effect. No hydrocephalus. No extra-axial fluid collection. Major dural sinuses are grossly patent. Pituitary gland suprasellar region normal. Midline structures intact. Vascular: Major intracranial vascular flow voids are maintained at the skull base. Skull and upper cervical spine: Craniocervical junction normal. Upper cervical spine normal. Bone    marrow signal intensity within normal limits. Hyperostosis frontalis interna noted. No scalp soft tissue abnormality. Sinuses/Orbits: Globes and orbital soft tissues within normal limits. Patient status post lens extraction bilaterally. Mild scattered mucosal thickening within the ethmoidal air cells and maxillary sinuses. No air-fluid level to suggest acute sinusitis. No mastoid effusion. Inner ear structures normal. Other: None. IMPRESSION: 1. Few small volume new acute ischemic infarcts involving the parasagittal cortical and subcortical/periventricular left frontal lobe, new relative to recent MRI from 07/09/2017. No associated hemorrhage or mass effect. 2. Normal expected interval evolution of recently identified small volume left-sided infarcts, relatively stable in size and distribution as compared to previous. 3. Otherwise stable appearance of the brain with chronic atrophy and small vessel ischemic disease, with multiple scattered remote infarcts as above. Electronically Signed   By: Benjamin  McClintock M.D.   On: 07/11/2017 22:44   Mr Brain Wo Contrast  Result Date: 07/09/2017 CLINICAL DATA:  Stroke aphasia.  Right-sided weakness EXAM: MRI HEAD WITHOUT CONTRAST MRA HEAD WITHOUT CONTRAST TECHNIQUE: Multiplanar, multiecho pulse sequences of the brain and surrounding structures were obtained without intravenous contrast. Angiographic images of the head were obtained using MRA technique without contrast. COMPARISON:  CT head one thousand nineteen FINDINGS: MRI HEAD FINDINGS Brain: Small areas of acute infarction in the left corona radiata and left frontal white matter. Chronic infarct left occipital parietal lobe. Chronic microvascular ischemic change in the white matter bilaterally. Negative for hemorrhage mass or edema. Vascular: Normal arterial flow voids Skull and upper cervical spine: Negative Sinuses/Orbits: Mucosal edema right maxillary sinus. Bilateral cataract removal Other: None MRA HEAD  FINDINGS Image quality degraded by motion Moderately severe stenosis distal vertebral artery bilaterally. Mild to moderate stenosis proximal basilar. Cerebellar arteries not well seen due to motion. Severe stenosis proximal right posterior cerebral artery. Occlusion mid left posterior cerebral artery. Fetal origin left posterior cerebral artery. Atherosclerotic irregularity and mild moderate stenosis in the cavernous carotid bilaterally. Diffuse atherosclerotic disease and middle cerebral arteries bilaterally. Both anterior cerebral arteries are patent. IMPRESSION: Small areas of acute infarct in the deep white matter on the left Moderate chronic ischemic changes in the white matter and left occipital parietal lobe. MRA degraded by motion. Diffuse intracranial atherosclerotic disease. Electronically Signed   By: Charles  Clark M.D.   On: 07/09/2017 12:31   Us Carotid Bilateral (at [facility] And Ap Only)  Result Date: 07/09/2017 CLINICAL DATA:  51-year-old female with a history of right-sided cerebrovascular accident EXAM: BILATERAL CAROTID DUPLEX ULTRASOUND TECHNIQUE: Gray scale imaging, color Doppler and duplex ultrasound were performed of bilateral carotid and vertebral arteries in the neck. COMPARISON:  Prior duplex carotid ultrasound 10/02/2016 FINDINGS: Criteria: Quantification of carotid stenosis is based on velocity parameters that correlate the residual internal carotid diameter with NASCET-based stenosis levels, using the diameter of the distal internal carotid lumen as the denominator for stenosis measurement. The following velocity measurements were obtained: RIGHT ICA: 115/30 cm/sec CCA: 86/15 cm/sec SYSTOLIC ICA/CCA RATIO:  1.0 ECA:  93 cm/sec LEFT ICA: 98/30 cm/sec CCA: 86/16 cm/sec SYSTOLIC ICA/CCA RATIO:  1.1 ECA:  122 cm/sec RIGHT CAROTID ARTERY: Moderate heterogeneous and slightly irregular atherosclerotic plaque in the proximal internal carotid artery. By peak systolic velocity criteria, the  estimated stenosis remains less than 50%. RIGHT VERTEBRAL ARTERY:  Patent with normal antegrade flow. LEFT CAROTID ARTERY: Trace heterogeneous and relatively smooth atherosclerotic plaque in the proximal internal carotid artery. By peak systolic velocity criteria, the estimated stenosis remains less than 50%. LEFT VERTEBRAL ARTERY:  Patent with   normal antegrade flow. IMPRESSION: 1. Mild (1-49%) stenosis proximal right internal carotid artery secondary to moderate heterogeneous and slightly irregular atherosclerotic plaque. 2. Mild (1-49%) stenosis proximal left internal carotid artery secondary to minimal heterogeneous but smooth atherosclerotic plaque. 3. Vertebral arteries are patent with normal antegrade flow. Signed, Heath K. McCullough, MD [facility] Electronically Signed   By: Heath  McCullough M.D.   On: 07/09/2017 09:05   Mr Mra Head/brain Wo Cm  Result Date: 07/09/2017 CLINICAL DATA:  Stroke aphasia.  Right-sided weakness EXAM: MRI HEAD WITHOUT CONTRAST MRA HEAD WITHOUT CONTRAST TECHNIQUE: Multiplanar, multiecho pulse sequences of the brain and surrounding structures were obtained without intravenous contrast. Angiographic images of the head were obtained using MRA technique without contrast. COMPARISON:  CT head one thousand nineteen FINDINGS: MRI HEAD FINDINGS Brain: Small areas of acute infarction in the left corona radiata and left frontal white matter. Chronic infarct left occipital parietal lobe. Chronic microvascular ischemic change in the white matter bilaterally. Negative for hemorrhage mass or edema. Vascular: Normal arterial flow voids Skull and upper cervical spine: Negative Sinuses/Orbits: Mucosal edema right maxillary sinus. Bilateral cataract removal Other: None MRA HEAD FINDINGS Image quality degraded by motion Moderately severe stenosis distal vertebral artery bilaterally. Mild to moderate stenosis proximal basilar. Cerebellar  arteries not well seen due to motion. Severe stenosis proximal right posterior cerebral artery. Occlusion mid left posterior cerebral artery. Fetal origin left posterior cerebral artery. Atherosclerotic irregularity and mild moderate stenosis in the cavernous carotid bilaterally. Diffuse atherosclerotic disease and middle cerebral arteries bilaterally. Both anterior cerebral arteries are patent. IMPRESSION: Small areas of acute infarct in the deep white matter on the left Moderate chronic ischemic changes in the white matter and left occipital parietal lobe. MRA degraded by motion. Diffuse intracranial atherosclerotic disease. Electronically Signed   By: Charles  Clark M.D.   On: 07/09/2017 12:31    12-lead ECG SR (personally reviewed) All prior EKG's in EPIC reviewed with no documented atrial fibrillation  Telemetry SR, 9 beat run NCT (personally reviewed)  Assessment and Plan:  1. Cryptogenic stroke The patient presents with cryptogenic stroke. TEE previously without cause identified.  She has previously worn 30 day monitor without AF found.  I spoke at length with the patient and son-in-law about monitoring for afib with an implantable loop recorder.  Risks, benefits, and alteratives to implantable loop recorder were discussed with the patient today.   At this time, the patient is very clear in their decision to proceed with implantable loop recorder.   Wound care was reviewed with the patient (keep incision clean and dry for 3 days).  Wound check scheduled and entered in AVS.  Please call with questions.   Amber Seiler, NP 07/13/2017 9:52 AM  I have seen, examined the patient, and reviewed the above assessment and plan.  Changes to above are made where necessary.  On exam, RRR.  Pt with ongoing expressive aphasia.  I agree with Dr Sethi that long term monitoring is appropriate to exclude afib as the cause for her stroke.  Risks and benefits to ILR as well as remote monitoring was discussed  with the patient who wishes to proceed.  Co Sign: Thalya Fouche, MD 07/13/2017 10:20 AM   

## 2017-07-13 NOTE — Progress Notes (Signed)
SLP Cancellation Note  Patient Details Name: Kelly Romero MRN: [id number] DOB: 1966-01-20   Cancelled treatment:  Pt and family preparing at this time for 3:00 meeting with CIR admissions coordinator to discuss possible D/C to CIR today. Will defer speech/language eval.  Pt has orders for swallow assessment, but passed RN stroke swallow screen.  Will follow for plan.         Blenda Mounts Laurice 07/13/2017, 3:04 PM

## 2017-07-14 ENCOUNTER — Inpatient Hospital Stay (HOSPITAL_COMMUNITY): Payer: Medicaid Other | Admitting: Occupational Therapy

## 2017-07-14 ENCOUNTER — Inpatient Hospital Stay (HOSPITAL_COMMUNITY): Payer: Medicaid Other | Admitting: Speech Pathology

## 2017-07-14 ENCOUNTER — Encounter (HOSPITAL_COMMUNITY): Payer: Self-pay | Admitting: Internal Medicine

## 2017-07-14 ENCOUNTER — Inpatient Hospital Stay (HOSPITAL_COMMUNITY): Payer: Medicaid Other | Admitting: Physical Therapy

## 2017-07-14 DIAGNOSIS — I69391 Dysphagia following cerebral infarction: Secondary | ICD-10-CM

## 2017-07-14 DIAGNOSIS — I69322 Dysarthria following cerebral infarction: Secondary | ICD-10-CM

## 2017-07-14 DIAGNOSIS — E1140 Type 2 diabetes mellitus with diabetic neuropathy, unspecified: Secondary | ICD-10-CM

## 2017-07-14 LAB — COMPREHENSIVE METABOLIC PANEL
ALBUMIN: 2.5 g/dL — AB (ref 3.5–5.0)
ALK PHOS: 59 U/L (ref 38–126)
ALT: 10 U/L — ABNORMAL LOW (ref 14–54)
ANION GAP: 8 (ref 5–15)
AST: 14 U/L — AB (ref 15–41)
BUN: 12 mg/dL (ref 6–20)
CALCIUM: 9.5 mg/dL (ref 8.9–10.3)
CO2: 29 mmol/L (ref 22–32)
Chloride: 105 mmol/L (ref 101–111)
Creatinine, Ser: 0.95 mg/dL (ref 0.44–1.00)
GFR calc Af Amer: >60 mL/min (ref >60)
GFR calc non Af Amer: >60 mL/min (ref >60)
GLUCOSE: 120 mg/dL — AB (ref 65–99)
Potassium: 3.6 mmol/L (ref 3.5–5.1)
Sodium: 142 mmol/L (ref 135–145)
Total Bilirubin: 0.6 mg/dL (ref 0.3–1.2)
Total Protein: 5.6 g/dL — ABNORMAL LOW (ref 6.5–8.1)

## 2017-07-14 LAB — CBC WITH DIFFERENTIAL/PLATELET
BASOS ABS: 0 10*3/uL (ref 0.0–0.1)
BASOS PCT: 0 %
EOS ABS: 0.1 10*3/uL (ref 0.0–0.7)
Eosinophils Relative: 3 %
HCT: 29.8 % — ABNORMAL LOW (ref 36.0–46.0)
HEMOGLOBIN: 9.5 g/dL — AB (ref 12.0–15.0)
Lymphocytes Relative: 39 %
Lymphs Abs: 1.9 10*3/uL (ref 0.7–4.0)
MCH: 28.8 pg (ref 26.0–34.0)
MCHC: 31.9 g/dL (ref 30.0–36.0)
MCV: 90.3 fL (ref 78.0–100.0)
MONOS PCT: 8 %
Monocytes Absolute: 0.4 10*3/uL (ref 0.1–1.0)
NEUTROS ABS: 2.4 10*3/uL (ref 1.7–7.7)
NEUTROS PCT: 50 %
Platelets: 252 10*3/uL (ref 150–400)
RBC: 3.3 MIL/uL — ABNORMAL LOW (ref 3.87–5.11)
RDW: 14.1 % (ref 11.5–15.5)
WBC: 4.8 10*3/uL (ref 4.0–10.5)

## 2017-07-14 LAB — GLUCOSE, CAPILLARY
GLUCOSE-CAPILLARY: 120 mg/dL — AB (ref 65–99)
GLUCOSE-CAPILLARY: 143 mg/dL — AB (ref 65–99)
Glucose-Capillary: 117 mg/dL — ABNORMAL HIGH (ref 65–99)
Glucose-Capillary: 145 mg/dL — ABNORMAL HIGH (ref 65–99)

## 2017-07-14 NOTE — Care Management Note (Signed)
[facility] Individual Statement of Services  Patient Name:  Kelly Romero  Date:  07/14/2017  Welcome to the [facility].  Our goal is to provide you with an individualized program based on your diagnosis and situation, designed to meet your specific needs.  With this comprehensive rehabilitation program, you will be expected to participate in at least 3 hours of rehabilitation therapies Monday-Friday, with modified therapy programming on the weekends.  Your rehabilitation program will include the following services:  Physical Therapy (PT), Occupational Therapy (OT), Speech Therapy (ST), 24 hour per day rehabilitation nursing, Therapeutic Recreaction (TR), Neuropsychology, Case Management (Social Worker), Rehabilitation Medicine, Nutrition Services and Pharmacy Services  Weekly team conferences will be held on Wednesday to discuss your progress.  Your Social Worker will talk with you frequently to get your input and to update you on team discussions.  Team conferences with you and your family in attendance may also be held.  Expected length of stay: 2-2.5 weeks  Overall anticipated outcome: supervision with cueing  Depending on your progress and recovery, your program may change. Your Social Worker will coordinate services and will keep you informed of any changes. Your Social Worker's name and contact numbers are listed  below.  The following services may also be recommended but are not provided by the [facility]:    Home Health Rehabiltiation Services  Outpatient Rehabilitation Services    Arrangements will be made to provide these services after discharge if needed.  Arrangements include referral to agencies that provide these services.  Your insurance has been verified to be: Medicaid Your primary doctor is:  Gwendlyn Deutscher  Pertinent information will be shared with your doctor and your insurance company.  Social Worker:   Dossie Der, SW [phone] or [phone]  Information discussed with and copy given to patient by: [name], 07/14/2017, 3:23 PM

## 2017-07-14 NOTE — Evaluation (Signed)
Speech Language Pathology Assessment and Plan  Patient Details  Name: Kelly Romero MRN: [id number] Date of Birth: 07-Mar-1966  SLP Diagnosis: Dysarthria;Aphasia;Cognitive Impairments;Dysphagia  Rehab Potential: Fair ELOS: 2.5 to 3 weeks    Today's Date: 07/14/2017 SLP Individual Time: 0930-1030 SLP Individual Time Calculation (min): 60 min   Problem List:  Patient Active Problem List   Diagnosis Date Noted  . Thrombotic stroke ([place]) 07/13/2017  . Diabetes mellitus type 2 in obese ([place])   . History of CVA (cerebrovascular accident)   . Benign essential HTN   . Dysphagia, post-stroke   . Acute blood loss anemia   . Morbid obesity ([place])   . Right hemiparesis ([place])   . Cryptogenic stroke ([place]) 07/08/2017  . Chest pain 11/06/2016  . 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 Aphasia as late effect of stroke 04/10/2016  . Encephalopathy   . Cerebral thrombosis with cerebral infarction 12/29/2015  . Hyperglycemia   . Hyperlipidemia   . Hypertensive emergency   . Deep venous thrombosis ([place])   . AKI (acute kidney injury) ([place]) 12/26/2015  . Altered mental status   . Stroke-like symptoms   . Acute encephalopathy 08/17/2015  . Anxiety and depression   . CVA (cerebrovascular accident due to intracerebral hemorrhage) ([place]) 08/15/2015  . Stroke ([place])   . Encephalopathy, metabolic 16/55/3748  . Uncontrolled type 2 diabetes mellitus with diabetic polyneuropathy ([place]) 12/28/2014  . Essential hypertension 12/28/2014  . New onset headache 11/02/2014   Past Medical History:  Past Medical History:  Diagnosis Date  . Diabetes mellitus without complication ([place])   . Headache   . Hyperlipidemia   . Hypertension   . Stroke ([place])   . TIA (transient ischemic attack)    Past Surgical History:  Past Surgical History:  Procedure Laterality Date  . APPENDECTOMY    . CHOLECYSTECTOMY    . FOOT GANGLION EXCISION    . HERNIA REPAIR    . IR GENERIC  HISTORICAL  12/30/2015   IR ANGIO INTRA EXTRACRAN SEL COM CAROTID INNOMINATE BILAT MOD SED 12/30/2015 Luanne Bras, MD [facility]INTER[facility]  . IR GENERIC HISTORICAL  12/30/2015   IR ANGIO VERTEBRAL SEL VERTEBRAL BILAT MOD SED 12/30/2015 Luanne Bras, MD [facility]  . LOOP RECORDER INSERTION N/A 07/13/2017   Procedure: LOOP RECORDER INSERTION;  Surgeon: Thompson Grayer, MD;  Location: [place] [facility];  Service: Cardiovascular;  Laterality: N/A;  . TEE WITHOUT CARDIOVERSION N/A 04/19/2015   Procedure: TRANSESOPHAGEAL ECHOCARDIOGRAM (TEE);  Surgeon: Jerline Pain, MD;  Location: [facility] ENDOSCOPY;  Service: Cardiovascular;  Laterality: N/A;  . VAGINAL HYSTERECTOMY      Assessment / Plan / Recommendation Clinical Impression [name] is a 52 year old female with history of HTN, T2DM, depression, HA,medication non-compliance, multiple CVAs most recent 2/12-2/15/19 [facility] and discharge from [facility] on 07/10/17 due to acute left hemisphere infarcts. She had worsening of slurred speech and right sided weakness and was admitted to [facility] on 07/11/17 for work up. MRI brain repeated and showed few new strokes in parasagittal cortical and subcortical left frontal lobe. Patient with negative work up for vasculitis, hypercoagulable state as well as spinal tap. Dr. Leonie Man felt that storke due to advanced intracranial atherosclerosis and recommended DAPT for 3 months. Loop recorder placed by Dr. Rayann Heman to rule out A fib. Patient with right sided weakness with cognitive deficits, lability, lethargy affecting ability to carry out ADLs and mobility. CIR recommended due to functional deficits and admited on 07/13/17.  Bedside swallow evaluation and cognitive linguistic evaluations completed on 07/14/17. Pt presents with moderate dysarthria d/t right facial weakness - ~ 50 to 75% at word level d/t imprecise articulation and decreased vocal intensity. She also present mooderate oral phase dysphagia - dysphagia 1 with thin liquids,  medicine crushed, full supervision - d/t right pocketing, increased lingual manipulation of bolus and mild to moderate expressive aphasia in unstructured/self-generated utterances as evidenced by word finding deficits perhaps further complicated by apraxia (gropping for words). Will need further assessment. Recommend skilled ST to target the above mentioned deficits, increase functional independence and reduce caregiver burden. Anticipate that pt might require additional therapy services thru SNF at time of discharge from CIR.    Skilled Therapeutic Interventions          Skilled treatment session focused on completion of BSE and SLE, see above. Pt very emotional during evaluation and support given with POC established.   SLP Assessment  Patient will need skilled Speech Lanaguage Pathology Services during CIR admission    Recommendations  SLP Diet Recommendations: Dysphagia 1 (Puree);Thin Liquid Administration via: Cup;Straw Medication Administration: Crushed with puree Supervision: Staff to assist with self feeding;Full supervision/cueing for compensatory strategies Compensations: Minimize environmental distractions;Lingual sweep for clearance of pocketing;Slow rate;Small sips/bites Postural Changes and/or Swallow Maneuvers: Seated upright 90 degrees Oral Care Recommendations: Oral care BID Recommendations for Other Services: Neuropsych consult Patient destination: Home Follow up Recommendations: Home Health SLP;24 hour supervision/assistance Equipment Recommended: None recommended by SLP    SLP Frequency 3 to 5 out of 7 days   SLP Duration  SLP Intensity  SLP Treatment/Interventions 2.5 to 3 weeks  Minumum of 1-2 x/day, 30 to 90 minutes  Cognitive remediation/compensation;Cueing hierarchy;Dysphagia/aspiration precaution training;Functional tasks;Patient/family education;Therapeutic Activities    Pain Pain Assessment Pain Scale: (denies c/o pain)  Prior  Functioning Cognitive/Linguistic Baseline: Within functional limits Type of Home: House  Lives With: (daughter [name]) & son-in-law Jerrye Beavers) and their two children (8 & 5 y/o)) Available Help at Discharge: Family;Available 24 hours/day Vocation: On disability  Function:  Eating Eating   Modified Consistency Diet: Yes Eating Assist Level: More than reasonable amount of time;Set up assist for;Supervision or verbal cues   Eating Set Up Assist For: Opening containers       Cognition Comprehension Comprehension assist level: Understands basic 50 - 74% of the time/ requires cueing 25 - 49% of the time  Expression   Expression assist level: Expresses basic 25 - 49% of the time/requires cueing 50 - 75% of the time. Uses single words/gestures.  Social Interaction Social Interaction assist level: Interacts appropriately 50 - 74% of the time - May be physically or verbally inappropriate.  Problem Solving Problem solving assist level: Solves basic 50 - 74% of the time/requires cueing 25 - 49% of the time  Memory Memory assist level: Recognizes or recalls 50 - 74% of the time/requires cueing 25 - 49% of the time   Short Term Goals: Week 1: SLP Short Term Goal 1 (Week 1): Pt will utilize word finding strategies to convey basic thoughts and ideas with Mod A cues.  SLP Short Term Goal 2 (Week 1): Pt will utilize speech intelligibility strategies with Mod A  to increase speech intelligibility to ~ 90% at the word level.  SLP Short Term Goal 3 (Week 1): Pt will solve semi-complex problem solving tasks with Mod A cues.  SLP Short Term Goal 4 (Week 1): Pt will demonstrate selective attention in moderately distracting environment for 30 minutes iwth Mod  A cues.  SLP Short Term Goal 5 (Week 1): Pt will consume trials of dysphagia 2 with Mod A cues for use of compensatory swallow strategies to effectively clear oral residue.   Refer to Care Plan for Long Term Goals  Recommendations for other services:  None   Discharge Criteria: Patient will be discharged from SLP if patient refuses treatment 3 consecutive times without medical reason, if treatment goals not met, if there is a change in medical status, if patient makes no progress towards goals or if patient is discharged from hospital.  The above assessment, treatment plan, treatment alternatives and goals were discussed and mutually agreed upon: by patient  Gwendlyon Zumbro 07/14/2017, 1:14 PM

## 2017-07-14 NOTE — Progress Notes (Signed)
Subjective/Complaints: Severe dysarthria speech is <50% intelligible ROS-  Limited due to dysarthria and cognition Objective: Vital Signs: Blood pressure (!) 180/73, pulse 77, temperature 98 F (36.7 C), temperature source Oral, resp. rate 15, height _0  (1.651 m), weight 97 kg (213 lb 13.5 oz), SpO2 99 %. No results found. Results for orders placed or performed during the hospital encounter of 07/13/17 (from the past 72 hour(s))  Glucose, capillary     Status: Abnormal   Collection Time: 07/13/17  9:46 PM  Result Value Ref Range   Glucose-Capillary 137 (H) 65 - 99 mg/dL  CBC WITH DIFFERENTIAL     Status: Abnormal   Collection Time: 07/14/17  5:49 AM  Result Value Ref Range   WBC 4.8 4.0 - 10.5 K/uL   RBC 3.30 (L) 3.87 - 5.11 MIL/uL   Hemoglobin 9.5 (L) 12.0 - 15.0 g/dL   HCT 29.8 (L) 36.0 - 46.0 %   MCV 90.3 78.0 - 100.0 fL   MCH 28.8 26.0 - 34.0 pg   MCHC 31.9 30.0 - 36.0 g/dL   RDW 14.1 11.5 - 15.5 %   Platelets 252 150 - 400 K/uL   Neutrophils Relative % 50 %   Neutro Abs 2.4 1.7 - 7.7 K/uL   Lymphocytes Relative 39 %   Lymphs Abs 1.9 0.7 - 4.0 K/uL   Monocytes Relative 8 %   Monocytes Absolute 0.4 0.1 - 1.0 K/uL   Eosinophils Relative 3 %   Eosinophils Absolute 0.1 0.0 - 0.7 K/uL   Basophils Relative 0 %   Basophils Absolute 0.0 0.0 - 0.1 K/uL    Comment: Performed at [facility], [address] [place], [place] [address]  Comprehensive metabolic panel     Status: Abnormal   Collection Time: 07/14/17  5:49 AM  Result Value Ref Range   Sodium 142 135 - 145 mmol/L   Potassium 3.6 3.5 - 5.1 mmol/L   Chloride 105 101 - 111 mmol/L   CO2 29 22 - 32 mmol/L   Glucose, Bld 120 (H) 65 - 99 mg/dL   BUN 12 6 - 20 mg/dL   Creatinine, Ser 0.95 0.44 - 1.00 mg/dL   Calcium 9.5 8.9 - 10.3 mg/dL   Total Protein 5.6 (L) 6.5 - 8.1 g/dL   Albumin 2.5 (L) 3.5 - 5.0 g/dL   AST 14 (L) 15 - 41 U/L   ALT 10 (L) 14 - 54 U/L   Alkaline Phosphatase 59 38 - 126 U/L   Total  Bilirubin 0.6 0.3 - 1.2 mg/dL   GFR calc non Af Amer >60 >60 mL/min   GFR calc Af Amer >60 >60 mL/min    Comment: (NOTE) The eGFR has been calculated using the CKD EPI equation. This calculation has not been validated in all clinical situations. eGFR's persistently <60 mL/min signify possible Chronic Kidney Disease.    Anion gap 8 5 - 15    Comment: Performed at [facility] [address] [place], [place] [address]  Glucose, capillary     Status: Abnormal   Collection Time: 07/14/17  6:20 AM  Result Value Ref Range   Glucose-Capillary 117 (H) 65 - 99 mg/dL     HEENT: Right facial droop lower Cardio: RRR and no murmur Resp: CTA B/L and Unlabored GI: BS positive and NT, ND Extremity:  No Edema Skin:   Intact Neuro: Alert/Oriented, Abnormal Sensory intact LT in toes, Abnormal Motor 3/5 R delt bi tri grip, 5/5 in LUE,  2- R HF, KE, ADF, 4/5 Left HF, KE, ADF, Dysarthric and Other word finding deficits Musc/Skel:  Other no pain with UE or LE ROM Gen NAD   Assessment/Plan: 1. 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 Physiatrist is providing close team supervision and 24 hour management of active medical problems listed below. Physiatrist and rehab team continue to assess barriers to discharge/monitor patient progress toward functional and medical goals. FIM:                   Function - Comprehension Comprehension: Auditory Comprehension assist level: Follows basic conversation/direction with extra time/assistive device  Function - Expression Expression: Verbal Expression assist level: Expresses basic 50 - 74% of the time/requires cueing 25 - 49% of the time. Needs to repeat parts of sentences.  Function - Social  Interaction Social Interaction assist level: Interacts appropriately 50 - 74% of the time - May be physically or verbally inappropriate.  Function - Problem Solving Problem solving assist level: Solves basic 50 - 74% of the time/requires cueing 25 - 49% of the time  Function - Memory Memory assist level: Recognizes or recalls 50 - 74% of the time/requires cueing 25 - 49% of the time Patient normally able to recall (first 3 days only): Current season, That he or she is in a hospital  Medical Problem List and Plan:  1. Right hemiparesis and functional deficits secondary to left periventricular white matter and right frontal lobe infarcts  -CIR evals PT, OT, SLP 2. DVT Prophylaxis/Anticoagulation: Pharmaceutical: Lovenox 69m q 24h 3. Chronic HA/Pain Management: Depakote bid with tylenol prn.  4. Mood: LCSW to follow for evaluation and support.  5. Neuropsych: This patient is not fully capable of making decisions on her own behalf.  6. Skin/Wound Care: routine pressure relief measures.  7. Fluids/Electrolytes/Nutrition: Monitor I/O  8. T2DM: Hgb A1c- 13.6 Continue Levemir 20 U daily. Monitor BS ac/hs. Educate on importance of compliance.  CBG (last 3)  Recent Labs    07/13/17 0611 07/13/17 2146 07/14/17 0620  GLUCAP 123* 137* 117*  controlled 5/1 9. Dyslipidemia: On Crestor.  10. Anemia: Hgb stable at 9.5 on 5/1 11. HTN: Monitor BP bid. Resume lisinopril at lower dose and titrate daily.    LOS (Days) 1 A FACE TO FACE EVALUATION WAS PERFORMED  ACharlett Blake5/03/2017, 9:33 AM

## 2017-07-14 NOTE — Progress Notes (Signed)
Patient information reviewed and entered into eRehab system by Argie Lober, RN, CRRN, PPS Coordinator.  Information including medical coding and functional independence measure will be reviewed and updated through discharge.    

## 2017-07-14 NOTE — Progress Notes (Signed)
Social Work  Social Work Assessment and Plan  Patient Details  Name: Kelly Romero MRN: [id number] Date of Birth: [birth year]  Today's Date: 07/14/2017  Problem List:  Patient Active Problem List   Diagnosis Date Noted  . Thrombotic stroke ([facility]) 07/13/2017  . Diabetes mellitus type 2 in obese ([facility])   . History of CVA (cerebrovascular accident)   . Benign essential HTN   . Dysphagia, post-stroke   . Acute blood loss anemia   . Morbid obesity ([facility])   . Right hemiparesis ([facility])   . Cryptogenic stroke ([facility]) 07/08/2017  . Chest pain 11/06/2016  . 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 Aphasia as late effect of stroke 04/10/2016  . Encephalopathy   . Cerebral thrombosis with cerebral infarction 12/29/2015  . Hyperglycemia   . Hyperlipidemia   . Hypertensive emergency   . Deep venous thrombosis ([facility])   . AKI (acute kidney injury) ([facility]) 12/26/2015  . Altered mental status   . Stroke-like symptoms   . Acute encephalopathy 08/17/2015  . Anxiety and depression   . CVA (cerebrovascular accident due to intracerebral hemorrhage) ([facility]) 08/15/2015  . Stroke ([facility])   . Encephalopathy, metabolic 12/28/2014  . 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 Essential hypertension 12/28/2014  . New onset headache 11/02/2014   Past Medical History:  Past Medical History:  Diagnosis Date  . Diabetes mellitus without complication ([facility])   . Headache   . Hyperlipidemia   . Hypertension   . Stroke ([facility])   . TIA (transient ischemic attack)    Past Surgical History:  Past Surgical History:  Procedure Laterality Date  . APPENDECTOMY    . CHOLECYSTECTOMY    . FOOT GANGLION EXCISION    . HERNIA REPAIR    . IR GENERIC HISTORICAL  12/30/2015   IR ANGIO INTRA EXTRACRAN SEL COM CAROTID INNOMINATE BILAT MOD SED 12/30/2015 Julieanne Cotton, MD [facility]  . IR GENERIC HISTORICAL  12/30/2015   IR ANGIO VERTEBRAL SEL  VERTEBRAL BILAT MOD SED 12/30/2015 Julieanne Cotton, MD [facility]  . LOOP RECORDER INSERTION N/A 07/13/2017   Procedure: LOOP RECORDER INSERTION;  Surgeon: Hillis Range, MD;  [facility];  Service: Cardiovascular;  Laterality: N/A;  . TEE WITHOUT CARDIOVERSION N/A 04/19/2015   Procedure: TRANSESOPHAGEAL ECHOCARDIOGRAM (TEE);  Surgeon: Jake Bathe, MD;  Location: [facility] ENDOSCOPY;  Service: Cardiovascular;  Laterality: N/A;  . VAGINAL HYSTERECTOMY     Social History:  reports that she has never smoked. She has never used smokeless tobacco. She reports that she does not drink alcohol or use drugs.  Family / Support Systems Marital Status: Separated How Long?: 4 months Patient Roles: Parent Children: [name] Jochern-daughter [phone]-cell Other Supports: Reino Bellis in-law  [phone]-cell Anticipated Caregiver: [name] and Sharl Ma Ability/Limitations of Caregiver: Daughter works days and son in-law works nights Caregiver Availability: 24/7(Did not need 24 hr care prior to admission) Family Dynamics: Close knit who has taken pt in four months ago after separated from husband who was not assisting her. Her daughter and son in-law are wonderful and have been assisting her when needed. Pt feels very grateful to have them.  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 Will look toward her  daughter if any decisions need to be made while here.   Abuse/Neglect Abuse/Neglect Assessment Can Be Completed: Yes Physical Abuse: Denies Verbal Abuse: Denies Sexual Abuse: Denies Exploitation of patient/patient's resources: Denies Self-Neglect: Denies  Emotional Status Pt's  affect, behavior adn adjustment status: Pt is motivated to make progress and glad she is here. She is sad this has happened to her again and feels like she works hard to recover and then has to start all over again when she has another stroke. She is tearful at times and this is normal wiorker informed her. Recent Psychosocial Issues: recent hospitalization for another stroke at [facility].  Pyschiatric History: No history but feels depressed due to new CVA and recent separation. Will have neruo-psych see on Monday and see here daily to provide support while here. See if MD receommends a antidepressant while here. Substance Abuse History: No issues  Patient / Family Perceptions, Expectations & Goals Pt/Family understanding of illness & functional limitations: Pt and daughter can explain her stroke and deficits. Both have spoken with the MD and feel they understand her treatment plan and hope to prevent another one. Each one seems to be worse than the other. Premorbid pt/family roles/activities: Mom, retiree, grandmother, etc Anticipated changes in roles/activities/participation: resume Pt/family expectations/goals: Pt states: " I want to do for myself."  Daughter states: " She could walk with her rolling walker and didn't need help before this."  [employer]: None Premorbid Home Care/DME Agencies: Other (Comment)([facility] pt after [facility]) Transportation available at discharge: Daughter and son in-law Resource referrals recommended: Support group (specify), Neuropsychology  Discharge Planning Living Arrangements: Children Support Systems: Children, Friends/neighbors Type of Residence: Private residence Insurance Resources: [employer] (specify county) Architect: [employer]SI, Family Support Financial Screen Referred: No Living Expenses: Lives with family Money Management: Patient, Family Does the patient have any problems obtaining your medications?: No Home  Management: She helped daughter when she could Patient/Family Preliminary Plans: Return home with daughter, son in-law ans their two children ( 5 & 8). Between daughter who works first shift and son in-law who works third shift someone will be there with her. Pt does not want to need help or burden anyone due to son in-law needs his sleep Social Work Anticipated Follow Up Needs: HH/OP, Support Group  Clinical Impression Pleasant female who has been through a lot in a short period of time-multiple CVA's and separation and moving in with daughter's family. She would benefit from seeing neuro-psych while here and placed on medication for depression. Supportive daughter who is working and taking care of her two children, hopes she is doing well and can be mobile prior to discharge. Will work on discharge needs and provide support to pt.  [name] 07/14/2017, 3:20 PM

## 2017-07-14 NOTE — Evaluation (Signed)
Occupational Therapy Assessment and Plan  Patient Details  Name: Kelly Romero MRN: [id number] Date of Birth: [birth year]  OT Diagnosis: apraxia, ataxia, cognitive deficits, disturbance of vision, hemiplegia affecting dominant side and muscle weakness (generalized) Rehab Potential: Rehab Potential (ACUTE ONLY): Good ELOS: 2.5 weeks   Today's Date: 07/14/2017 OT Individual Time: 1400-1430 and 1400-1430 OT Individual Time Calculation (min): 30 min   And 30 min  Problem List:  Patient Active Problem List   Diagnosis Date Noted  . Thrombotic stroke ([place]) 07/13/2017  . Diabetes mellitus type 2 in obese ([place])   . History of CVA (cerebrovascular accident)   . Benign essential HTN   . Dysphagia, post-stroke   . Acute blood loss anemia   . Morbid obesity ([place])   . Right hemiparesis ([place])   . Cryptogenic stroke ([place]) 07/08/2017  . Chest pain 11/06/2016  . 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 Aphasia as late effect of stroke 04/10/2016  . Encephalopathy   . Cerebral thrombosis with cerebral infarction 12/29/2015  . Hyperglycemia   . Hyperlipidemia   . Hypertensive emergency   . Deep venous thrombosis ([place])   . AKI (acute kidney injury) ([place]) 12/26/2015  . Altered mental status   . Stroke-like symptoms   . Acute encephalopathy 08/17/2015  . Anxiety and depression   . CVA (cerebrovascular accident due to intracerebral hemorrhage) ([place]) 08/15/2015  . Stroke ([place])   . Encephalopathy, metabolic 54/62/7035  . Uncontrolled type 2 diabetes mellitus with diabetic polyneuropathy ([place]) 12/28/2014  . Essential hypertension 12/28/2014  . New onset headache 11/02/2014    Past Medical History:  Past Medical History:  Diagnosis Date  . Diabetes mellitus without complication ([place])   . Headache   . Hyperlipidemia   . Hypertension   . Stroke ([place])   . TIA (transient ischemic attack)    Past Surgical History:  Past Surgical History:  Procedure  Laterality Date  . APPENDECTOMY    . CHOLECYSTECTOMY    . FOOT GANGLION EXCISION    . HERNIA REPAIR    . IR GENERIC HISTORICAL  12/30/2015   IR ANGIO INTRA EXTRACRAN SEL COM CAROTID INNOMINATE BILAT MOD SED 12/30/2015 Luanne Bras, MD [facility]  . IR GENERIC HISTORICAL  12/30/2015   IR ANGIO VERTEBRAL SEL VERTEBRAL BILAT MOD SED 12/30/2015 Luanne Bras, MD [facility]  . LOOP RECORDER INSERTION N/A 07/13/2017   Procedure: LOOP RECORDER INSERTION;  Surgeon: Thompson Grayer, MD;  Location: [facility];  Service: Cardiovascular;  Laterality: N/A;  . TEE WITHOUT CARDIOVERSION N/A 04/19/2015   Procedure: TRANSESOPHAGEAL ECHOCARDIOGRAM (TEE);  Surgeon: Jerline Pain, MD;  Location: [facility] ENDOSCOPY;  Service: Cardiovascular;  Laterality: N/A;  . VAGINAL HYSTERECTOMY      Assessment & Plan Clinical Impression:  Cerebrovascular Accident with functional deficits.   HPI: Kelly Romero is a 52 year old female with history of HTN, T2DM, depression, HA,medication non-compliance, multiple CVAs most recent 2/12-2/15/19 RH and discharge from [facility] on 07/10/17 due to acute left hemisphere infarcts. She had worsening of slurred speech and right sided weakness and was admitted to [facility] on 07/11/17 for work up. MRI brain repeated and showed few new strokes in parasagittal cortical and subcortical left frontal lobe. Patient with negative work up for vasculitis, hypercoagulable state as well as spinal tap. Dr. Leonie Man felt that storke due to advanced intracranial atherosclerosis and recommended DAPT for 3 months. Loop recorder placed by Dr. Rayann Heman to rule out A fib. Patient with right  sided weakness with cognitive deficits, lability, lethargy affecting ability to carry out ADLs and mobility. CIR recommended due to functional deficits.   Patient transferred to CIR on 07/13/2017 .    Patient currently requires mod with basic self-care skills secondary to muscle weakness, decreased cardiorespiratoy endurance,  motor apraxia, ataxia, decreased coordination and decreased motor planning, decreased visual acuity, field cut and hemianopsia, decreased attention to right, decreased initiation, decreased attention, decreased awareness, decreased problem solving, decreased safety awareness, decreased memory and delayed processing and decreased sitting balance, decreased standing balance, decreased postural control, hemiplegia and decreased balance strategies.  Prior to hospitalization, patient could complete ADLs with modified independent .  Patient will benefit from skilled intervention to decrease level of assist with basic self-care skills and increase independence with basic self-care skills prior to discharge home with care partner.  Anticipate patient will require 24 hour supervision and follow up home health.  OT - End of Session Activity Tolerance: Tolerates 10 - 20 min activity with multiple rests Endurance Deficit: Yes Endurance Deficit Description: Rest breaks required throughout seated ADL tasks OT Assessment Rehab Potential (ACUTE ONLY): Good OT Patient demonstrates impairments in the following area(s): Balance;Behavior;Perception;Cognition;Safety;Sensory;Endurance;Motor;Vision OT Basic ADL's Functional Problem(s): Eating;Grooming;Bathing;Dressing;Toileting OT Transfers Functional Problem(s): Toilet;Tub/Shower OT Additional Impairment(s): Fuctional Use of Upper Extremity OT Plan OT Intensity: Minimum of 1-2 x/day, 45 to 90 minutes OT Frequency: 5 out of 7 days OT Duration/Estimated Length of Stay: 2.5 weeks OT Treatment/Interventions: Balance/vestibular training;Discharge planning;Functional electrical stimulation;Pain management;Self Care/advanced ADL retraining;Therapeutic Activities;UE/LE Coordination activities;Visual/perceptual remediation/compensation;Therapeutic Exercise;Patient/family education;Functional mobility training;Disease mangement/prevention;Cognitive  remediation/compensation;Community reintegration;DME/adaptive equipment instruction;Neuromuscular re-education;Psychosocial support;UE/LE Strength taining/ROM OT Self Feeding Anticipated Outcome(s): Supervision OT Basic Self-Care Anticipated Outcome(s): Supervision OT Toileting Anticipated Outcome(s): Supervision OT Bathroom Transfers Anticipated Outcome(s): Supervision OT Recommendation Recommendations for Other Services: Neuropsych consult Patient destination: Home Follow Up Recommendations: Home health OT Equipment Recommended: Tub/shower bench;To be determined Equipment Details: Pt has BSC   Skilled Therapeutic Intervention Session One: PT seen for OT eval and ADL bathing/dressing session. Pt sitting EOB upon arrival with RN present administering morning meds, pt agreeable to tx session and denying pain. Pt becoming tearful and emotionally labile when discussing PLOF and home set-up. Therefore, transitioned into functional bathing/dressing tasks. She ambulated throughout session with mod A, no AD. Assist required for lifting/lowering from standard height toilet. She bathed seated on tub bench in shower, standing with use of grab bars while assist provided for pericare/buttock hygiene.  Pt demonstrates poor awareness and problem solving abilities during functional tasks attempting to use R UE, though with good movement is very apraxic and potentially proprioceptive deficits- will cont to assess. Pt demonstrates poor dynamic sitting balance/ endurance, initially able to sit with supervision, however, in shower, required cuing and min A for midline sitting due to R lean Pt returned to chair to dress. She completed grooming tasks standing at sink, demonstrated poor control and placement of toothbrush when using L UE. Pt then becoming non-responsive to therapists cuing, returned to sitting and BP assessed, 154/78. Following seated rest break, pt reported feeling better and able to transfer to  recliner. Pt left seated in recliner with chair alarm on and all needs in reach.  Education provided throughout session regarding role of OT, POC, and d/c planning.  Session Two: Pt seen for OT session focusing on ambulation, toileting tasks and standing tolerance/balance. Pt sitting up in w/c upon arrival with daughter present. Able to receive PLOF and home layout information from daughter.  Pt then ambulated within room with  mod HHA to bathroom, requiring VCs for sequencing of transfer. Completed toileting tasks with heavy steadying assist due to R lean with dynamic standing, assist provided for thorough hygiene. Pt Completed grooming tasks standing at sink with steadying assist.  She returned to w/c at end of session, left with chair alarm on and all needs in reach.   OT Evaluation Precautions/Restrictions  Precautions Precautions: Fall Restrictions Weight Bearing Restrictions: No General Chart Reviewed: Yes Pain Pain Assessment Pain Scale: (denies c/o pain) Home Living/Prior Functioning Home Living Family/patient expects to be discharged to:: Private residence Living Arrangements: Children Available Help at Discharge: Family, Available 24 hours/day Type of Home: House Home Access: Stairs to enter [employer] of Steps: 4-5 Entrance Stairs-Rails: Right, Left, Can reach both Home Layout: One level Bathroom Shower/Tub: Optometrist: Yes  Lives With: Family(Daughter, son-in-law, and their 2 children) IADL History Homemaking Responsibilities: No Current License: No Prior Function Level of Independence: Independent with transfers, Independent with gait, Requires assistive device for independence(Used rollator PTA, pt's daughter reports she was independent with BADLS)  Able to Take Stairs?: Yes Driving: No Vocation: On disability Vision Baseline Vision/History: Wears glasses;Cataracts Wears Glasses: Reading  only Patient Visual Report: Blurring of vision Vision Assessment?: Vision impaired- to be further tested in functional context Additional Comments: R field cut Perception  Perception: Impaired Inattention/Neglect: Does not attend to right visual field Praxis Praxis: Impaired Praxis Impairment Details: Initiation;Motor planning Cognition Overall Cognitive Status: Impaired/Different from baseline Arousal/Alertness: Awake/alert Orientation Level: Person;Place;Situation Person: Oriented Place: Oriented Situation: Oriented Year: Other (Comment)(2011) Month: May Day of Week: Incorrect Memory: Impaired Memory Impairment: Decreased recall of new information;Decreased short term memory Decreased Short Term Memory: Verbal basic;Functional basic Immediate Memory Recall: Sock;Bed Memory Recall: Blue Memory Recall Blue: Without Cue Attention: Sustained Sustained Attention: Impaired Sustained Attention Impairment: Verbal complex;Functional complex Awareness: Impaired Awareness Impairment: Anticipatory impairment Problem Solving: Impaired Problem Solving Impairment: Functional basic;Verbal basic Behaviors: Lability Safety/Judgment: Impaired Sensation Sensation Light Touch: Appears Intact Hot/Cold: Appears Intact Proprioception: Impaired Detail Proprioception Impaired Details: Impaired RUE Coordination Gross Motor Movements are Fluid and Coordinated: No Fine Motor Movements are Fluid and Coordinated: No Coordination and Movement Description: R apraxia/ ataxia; Generalized weakness/deconditioning Finger Nose Finger Test: B decreased speed and accuracy Motor  Motor Motor: Hemiplegia Motor - Skilled Clinical Observations: generalized weakness Mobility  Bed Mobility Bed Mobility: Rolling Right;Rolling Left;Sit to Supine;Supine to Sit Rolling Right: 5: Supervision Rolling Left: 5: Supervision Supine to Sit: 4: Min assist Supine to Sit Details: Tactile cues for weight  shifting;Tactile cues for posture;Tactile cues for sequencing;Tactile cues for placement;Verbal cues for technique;Verbal cues for sequencing;Manual facilitation for weight shifting;Manual facilitation for placement Sit to Supine: 3: Mod assist Sit to Supine - Details: Tactile cues for weight shifting;Tactile cues for placement;Tactile cues for sequencing;Tactile cues for posture;Verbal cues for sequencing;Verbal cues for technique;Manual facilitation for weight shifting;Manual facilitation for placement Sit to Supine - Details (indicate cue type and reason): assist transferring BLE onto bed (daughter reports this is pre-existing) Transfers Sit to Stand: 4: Min assist;With armrests Stand to Sit: 4: Min assist;With armrests  Trunk/Postural Assessment  Cervical Assessment Cervical Assessment: Exceptions to WFL(Forward head) Thoracic Assessment Thoracic Assessment: Exceptions to WFL(Rounded shoulders; kyphotic) Lumbar Assessment Lumbar Assessment: Exceptions to WFL(Posterior pelvic tilt) Postural Control Postural Control: Deficits on evaluation(R lean with dynamic and static sitting and standing)  Balance Balance Balance Assessed: Yes Static Standing Balance Static Standing - Balance Support: Right upper extremity supported;During functional activity Static Standing -  Comment/# of Minutes: Standing at sink Dynamic Standing Balance Dynamic Standing - Balance Support: During functional activity Dynamic Standing - Level of Assistance: 3: Mod assist Dynamic Standing - Balance Activities: (during gait over level surfaces) Dynamic Standing - Comments: During toileting tasks Extremity/Trunk Assessment RUE Assessment RUE Assessment: Exceptions to WFL(Demonstrates near functional range, however, very apraxia and difficult to formally assess. ) RUE Strength RUE Overall Strength: Deficits(3/5 throughout) LUE Assessment LUE Assessment: Within Functional Limits   See Function Navigator for  Current Functional Status.   Refer to Care Plan for Long Term Goals  Recommendations for other services: Neuropsych   Discharge Criteria: Patient will be discharged from OT if patient refuses treatment 3 consecutive times without medical reason, if treatment goals not met, if there is a change in medical status, if patient makes no progress towards goals or if patient is discharged from hospital.  The above assessment, treatment plan, treatment alternatives and goals were discussed and mutually agreed upon: by patient and by family  Lyda Colcord L 07/14/2017, 3:38 PM

## 2017-07-14 NOTE — Patient Care Conference (Signed)
Inpatient RehabilitationTeam Conference and Plan of Care Update Date: 07/14/2017   Time: 10:55 AM    Patient Name: Kelly Romero      Medical Record Number: [id number]  Date of Birth: July 09, 1965 Sex: Female         Room/Bed: 4W25C/4W25C-01 Payor Info: Payor: MEDICAID [place] / Plan: MEDICAID OF [place] / Product Type: *No Product type* /    Admitting Diagnosis: CVA  Admit Date/Time:  07/13/2017  6:11 PM Admission Comments: No comment available   Primary Diagnosis:  <principal problem not specified> Principal Problem: <principal problem not specified>  Patient Active Problem List   Diagnosis Date Noted  . Thrombotic stroke ([facility]) 07/13/2017  . Diabetes mellitus type 2 in obese ([facility])   . History of CVA (cerebrovascular accident)   . Benign essential HTN   . Dysphagia, post-stroke   . Acute blood loss anemia   . Morbid obesity ([facility])   . Right hemiparesis ([facility])   . Cryptogenic stroke ([facility]) 07/08/2017  . Chest pain 11/06/2016  . 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 Aphasia as late effect of stroke 04/10/2016  . Encephalopathy   . Cerebral thrombosis with cerebral infarction 12/29/2015  . Hyperglycemia   . Hyperlipidemia   . Hypertensive emergency   . Deep venous thrombosis ([facility])   . AKI (acute kidney injury) ([facility]) 12/26/2015  . Altered mental status   . Stroke-like symptoms   . Acute encephalopathy 08/17/2015  . Anxiety and depression   . CVA (cerebrovascular accident due to intracerebral hemorrhage) ([facility]) 08/15/2015  . Stroke ([facility])   . Encephalopathy, metabolic 12/28/2014  . 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 Essential hypertension 12/28/2014  . New onset headache 11/02/2014    Expected Discharge Date:    Team Members Present: Physician leading conference: Dr. Claudette Laws Social Worker Present: Dossie Der, LCSW Nurse Present: Tennis Must, RN PT Present: Aleda Grana, PT OT  Present: Johnsie Cancel, OT SLP Present: Reuel Derby, SLP PPS Coordinator present : Tora Duck, RN, CRRN     Current Status/Progress Goal Weekly Team Focus  Medical   left periventricular infarct, Right frontal parasagittal, dysarthric, aphasia  maintain adequate nutrition  initiate rehab   Bowel/Bladder   continent bowel and bladder; LBM 4/29  maintain min assist  assess q shift and PRN   Swallow/Nutrition/ Hydration             ADL's   Mod A bathing/dressing, max A toileting, mod A functional transfers, min-mod A dynamic standing balance  Supervision overall for ADLs  ADL re-training, neuro re-ed, functional activity tolerance, functional standing and sitting balance   Mobility   min assist overall without AD, mod assist for transitional surfaces & to descend stairs  supervision<>min assist overall  pt education, transfers, gait, balance, strengthening, NMR   Communication             Safety/Cognition/ Behavioral Observations            Pain   denies pain  <4  assess pain qshift and PRN   Skin   ecchymosis to BLE; mild MASD to abdomen fold  prevent skin infection/breakdown  assess skin qshift and PRN      *See Care Plan and progress notes for long and short-term goals.     Barriers to Discharge  Current Status/Progress Possible Resolutions Date Resolved   Physician    Medical stability;Incontinence;Behavior     initiating program  cont rehab  Nursing                  PT                    OT                  SLP                SW                Discharge Planning/Teaching Needs:    Return back to daughter's home with she and son in-law assisting with her care. Pt was mod/i prior to admission with this stroke.     Team Discussion:  New evaluation setting goals probably supervision-min assist level. Will need neuro-psych to see for coping, tearful in therapies. Await therapists recommendations  Revisions to Treatment Plan:  New eval     Continued Need for  Acute Rehabilitation Level of Care: The patient requires daily medical management by a physician with specialized training in physical medicine and rehabilitation for the following conditions: Daily direction of a multidisciplinary physical rehabilitation program to ensure safe treatment while eliciting the highest outcome that is of practical value to the patient.: Yes Daily medical management of patient stability for increased activity during participation in an intensive rehabilitation regime.: Yes Daily analysis of laboratory values and/or radiology reports with any subsequent need for medication adjustment of medical intervention for : Neurological problems;Mood/behavior problems  [name] 07/15/2017, 8:37 AM

## 2017-07-14 NOTE — Evaluation (Signed)
Physical Therapy Assessment and Plan  Patient Details  Name: Kelly Romero MRN: [id number] Date of Birth: 1966-03-09  PT Diagnosis: Abnormal posture, Abnormality of gait, Cognitive deficits, Coordination disorder, Difficulty walking, Hemiplegia, Impaired cognition and Muscle weakness Rehab Potential: Good ELOS: 2 weeks   Today's Date: 07/14/2017 PT Individual Time: 4098-1191 PT Individual Time Calculation (min): 58 min    Problem List:  Patient Active Problem List   Diagnosis Date Noted  . Thrombotic stroke ([place]) 07/13/2017  . Diabetes mellitus type 2 in obese ([place])   . History of CVA (cerebrovascular accident)   . Benign essential HTN   . Dysphagia, post-stroke   . Acute blood loss anemia   . Morbid obesity ([place])   . Right hemiparesis ([place])   . Cryptogenic stroke ([place]) 07/08/2017  . Chest pain 11/06/2016  . 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 Aphasia as late effect of stroke 04/10/2016  . Encephalopathy   . Cerebral thrombosis with cerebral infarction 12/29/2015  . Hyperglycemia   . Hyperlipidemia   . Hypertensive emergency   . Deep venous thrombosis ([place])   . AKI (acute kidney injury) ([place]) 12/26/2015  . Altered mental status   . Stroke-like symptoms   . Acute encephalopathy 08/17/2015  . Anxiety and depression   . CVA (cerebrovascular accident due to intracerebral hemorrhage) ([place]) 08/15/2015  . Stroke ([place])   . Encephalopathy, metabolic 47/82/9562  . Uncontrolled type 2 diabetes mellitus with diabetic polyneuropathy ([place]) 12/28/2014  . Essential hypertension 12/28/2014  . New onset headache 11/02/2014    Past Medical History:  Past Medical History:  Diagnosis Date  . Diabetes mellitus without complication ([place])   . Headache   . Hyperlipidemia   . Hypertension   . Stroke ([place])   . TIA (transient ischemic attack)    Past Surgical History:  Past Surgical History:  Procedure Laterality Date  . APPENDECTOMY    .  CHOLECYSTECTOMY    . FOOT GANGLION EXCISION    . HERNIA REPAIR    . IR GENERIC HISTORICAL  12/30/2015   IR ANGIO INTRA EXTRACRAN SEL COM CAROTID INNOMINATE BILAT MOD SED 12/30/2015 Luanne Bras, MD [facility]  . IR GENERIC HISTORICAL  12/30/2015   IR ANGIO VERTEBRAL SEL VERTEBRAL BILAT MOD SED 12/30/2015 Luanne Bras, MD [facility]  . LOOP RECORDER INSERTION N/A 07/13/2017   Procedure: LOOP RECORDER INSERTION;  Surgeon: Thompson Grayer, MD;  Location: [facility];  Service: Cardiovascular;  Laterality: N/A;  . TEE WITHOUT CARDIOVERSION N/A 04/19/2015   Procedure: TRANSESOPHAGEAL ECHOCARDIOGRAM (TEE);  Surgeon: Jerline Pain, MD;  Location: [facility] ENDOSCOPY;  Service: Cardiovascular;  Laterality: N/A;  . VAGINAL HYSTERECTOMY      Assessment & Plan Clinical Impression: Patient is a 52 y.o. year old female with history of HTN, T2DM, depression, HA,medication non-compliance, multiple CVAs most recent 2/12-2/15/19 RH and discharge from [facility] on 07/10/17 due to acute left hemisphere infarcts. She had worsening of slurred speech and right sided weakness and was admitted to [facility] on 07/11/17 for work up. MRI brain repeated and showed few new strokes in parasagittal cortical and subcortical left frontal lobe. Patient with negative work up for vasculitis, hypercoagulable state as well as spinal tap. Dr. Leonie Man felt that storke due to advanced intracranial atherosclerosis and recommended DAPT for 3 months. Loop recorder placed by Dr. Rayann Heman to rule out A fib. Patient with right sided weakness with cognitive deficits, lability, lethargy affecting ability to carry out ADLs and mobility. CIR  recommended due to functional deficits. Patient transferred to CIR on 07/13/2017 .   Patient currently requires min/mod assist without AD with mobility secondary to muscle weakness, decreased cardiorespiratoy endurance, decreased coordination, decreased visual perceptual skills, decreased attention to right, decreased  attention, decreased awareness, decreased problem solving, decreased safety awareness, decreased memory and delayed processing, vestibular impairements and decreased standing balance, decreased postural control, hemiplegia and decreased balance strategies.  Prior to hospitalization, patient was supervision with rollator with mobility and lived with (daughter [name]) & son-in-law Jerrye Beavers) and their two children (8 & 5 y/o)) in a House home.  Home access is 4-5(at back door)Stairs to enter.  Patient will benefit from skilled PT intervention to maximize safe functional mobility, minimize fall risk and decrease caregiver burden for planned discharge home with 24 hour supervision.  Anticipate patient will benefit from follow up [place] at discharge.  PT - End of Session Activity Tolerance: Tolerates 30+ min activity with multiple rests Endurance Deficit: Yes Endurance Deficit Description: 2/2 generalized weakness PT Assessment Rehab Potential (ACUTE/IP ONLY): Good PT Patient demonstrates impairments in the following area(s): Balance;Perception;Behavior;Safety;Sensory;Endurance;Motor PT Transfers Functional Problem(s): Bed Mobility;Bed to Chair;Furniture;Floor;Car PT Locomotion Functional Problem(s): Wheelchair Mobility;Stairs;Ambulation PT Plan PT Intensity: Minimum of 1-2 x/day ,45 to 90 minutes PT Frequency: 5 out of 7 days PT Duration Estimated Length of Stay: 2 weeks PT Treatment/Interventions: Ambulation/gait training;Community reintegration;DME/adaptive equipment instruction;Neuromuscular re-education;Psychosocial support;Stair training;UE/LE Strength taining/ROM;Wheelchair propulsion/positioning;UE/LE Coordination activities;Therapeutic Activities;Skin care/wound management;Pain management;Functional electrical stimulation;Discharge planning;Balance/vestibular training;Cognitive remediation/compensation;Disease management/prevention;Functional mobility training;Patient/family education;Therapeutic  Exercise;Visual/perceptual remediation/compensation;Splinting/orthotics PT Transfers Anticipated Outcome(s): supervision<>min assist with LRAD PT Locomotion Anticipated Outcome(s): supervision<>min assist with LRAD PT Recommendation Recommendations for Other Services: Speech consult;Neuropsych consult;Therapeutic Recreation consult Therapeutic Recreation Interventions: Pet therapy;Stress management Follow Up Recommendations: Home health PT;24 hour supervision/assistance Patient destination: Home Equipment Recommended: To be determined  Skilled Therapeutic Intervention Pt received in recliner & agreeable to tx. PT evaluation initiated with therapist educating pt on ELOS, weekly interdisciplinary team meetings, and other various CIR information. Pt completes all tasks at a min assist level without AD, except mod assist for gait over transitional surfaces, mod assist bed mobility, and mod assist to descend stairs. Provided pt with 20x18 w/c for increased comfort and positioning when OOB. Pt's daughter arrived during session and confirmed pt's PLOF, stating pt required some assistance for stair negotiation and to transfer LE into bed. At end of session pt left sitting in w/c in room with chair alarm donned, needs within reach & daughter & LCSW [facility]) in room.   PT Evaluation Precautions/Restrictions Precautions Precautions: Fall Restrictions Weight Bearing Restrictions: No  General Chart Reviewed: Yes Additional Pertinent History: HTN, DM 2, depression, multiple CVAs, HLD, morbid obesity Response to Previous Treatment: Patient with no complaints from previous session. Family/Caregiver Present: Yes(daughter ([name]) arrived towards end of session)  Pain Pain Assessment Pain Scale: (denies c/o pain)  Home Living/Prior Functioning Home Living Available Help at Discharge: Family;Available 24 hours/day Type of Home: House Home Access: Stairs to enter [employer] of Steps: 4-5(at  back door) Entrance Stairs-Rails: Right;Left;Can reach both Home Layout: One level Lives With: (daughter [name]) & son-in-law Jerrye Beavers) and their two children (8 & 81 y/o)) Prior Function Level of Independence: Independent with transfers;Independent with gait;Requires assistive device for independence(used rollator, steady assist for stair negotiation at baseline)  Able to Take Stairs?: Yes(with steady assist per daughter report) Driving: No Vocation: On disability  Vision/Perception  Wears glasses for reading at baseline. Pt reports hx of cataract removal surgery. Pt notes slightly blurry  vision following this medical event.  Cognition Overall Cognitive Status: Impaired/Different from baseline Arousal/Alertness: Awake/alert Orientation Level: Oriented to person;Oriented to place;Oriented to situation Memory: Impaired Memory Impairment: Decreased recall of new information;Decreased short term memory Awareness: Impaired Awareness Impairment: Anticipatory impairment Problem Solving: Impaired Safety/Judgment: Impaired  Sensation Coordination Gross Motor Movements are Fluid and Coordinated: No  Motor  Motor Motor: Hemiplegia Motor - Skilled Clinical Observations: generalized weakness   Mobility Bed Mobility Bed Mobility: Rolling Right;Rolling Left;Sit to Supine;Supine to Sit Rolling Right: 5: Supervision Rolling Left: 5: Supervision Supine to Sit: 4: Min assist Supine to Sit Details: Tactile cues for weight shifting;Tactile cues for posture;Tactile cues for sequencing;Tactile cues for placement;Verbal cues for technique;Verbal cues for sequencing;Manual facilitation for weight shifting;Manual facilitation for placement Sit to Supine: 3: Mod assist Sit to Supine - Details: Tactile cues for weight shifting;Tactile cues for placement;Tactile cues for sequencing;Tactile cues for posture;Verbal cues for sequencing;Verbal cues for technique;Manual facilitation for weight shifting;Manual  facilitation for placement Sit to Supine - Details (indicate cue type and reason): assist transferring BLE onto bed (daughter reports this is pre-existing) Transfers Transfers: Yes Sit to Stand: 4: Min assist;With armrests Stand to Sit: 4: Min assist;With armrests Stand Pivot Transfers: 4: Min assist Stand Pivot Transfer Details: Verbal cues for technique  Locomotion  Ambulation Ambulation: Yes Ambulation/Gait Assistance: 4: Min assist Ambulation Distance (Feet): 110 Feet Assistive device: None Gait Gait: Yes Gait Pattern: (decreased weight shift L, decreased step & stride length, minimal heel strike BLE) Gait velocity: decreased gait speed Stairs / Additional Locomotion Stairs: Yes Stairs Assistance: 3: Mod assist(mod assist to descend 2/2 poor eccentric control) Stair Management Technique: Two rails Number of Stairs: 4 Height of Stairs: 6(inches) Ramp: 4: Min assist(ambulatory without AD) Wheelchair Mobility Wheelchair Mobility: No   Trunk/Postural Assessment  Cervical Assessment Cervical Assessment: (forward head) Thoracic Assessment Thoracic Assessment: (slightly rounded shoulders) Postural Control Postural Control: Deficits on evaluation   Balance Balance Balance Assessed: Yes Dynamic Standing Balance Dynamic Standing - Balance Support: No upper extremity supported Dynamic Standing - Level of Assistance: 4: Min assist Dynamic Standing - Balance Activities: (during gait over level surfaces)  Extremity Assessment  RUE Assessment RUE Assessment: Within Functional Limits LUE Assessment LUE Assessment: Within Functional Limits RLE Assessment RLE Assessment: Within Functional Limits LLE Assessment LLE Assessment: Within Functional Limits   See Function Navigator for Current Functional Status.   Refer to Care Plan for Long Term Goals  Recommendations for other services: Neuropsych and Therapeutic Recreation  Pet therapy and Stress management  Discharge  Criteria: Patient will be discharged from PT if patient refuses treatment 3 consecutive times without medical reason, if treatment goals not met, if there is a change in medical status, if patient makes no progress towards goals or if patient is discharged from hospital.  The above assessment, treatment plan, treatment alternatives and goals were discussed and mutually agreed upon: by patient  Waunita Schooner 07/14/2017, 12:23 PM

## 2017-07-15 ENCOUNTER — Inpatient Hospital Stay (HOSPITAL_COMMUNITY): Payer: Medicaid Other | Admitting: Speech Pathology

## 2017-07-15 ENCOUNTER — Inpatient Hospital Stay (HOSPITAL_COMMUNITY): Payer: Medicaid Other | Admitting: Occupational Therapy

## 2017-07-15 ENCOUNTER — Inpatient Hospital Stay (HOSPITAL_COMMUNITY): Payer: Medicaid Other

## 2017-07-15 LAB — GLUCOSE, CAPILLARY
GLUCOSE-CAPILLARY: 101 mg/dL — AB (ref 65–99)
GLUCOSE-CAPILLARY: 94 mg/dL (ref 65–99)
Glucose-Capillary: 200 mg/dL — ABNORMAL HIGH (ref 65–99)
Glucose-Capillary: 159 mg/dL — ABNORMAL HIGH (ref 65–99)
Glucose-Capillary: 145 mg/dL — ABNORMAL HIGH (ref 65–99)
Glucose-Capillary: 81 mg/dL (ref 65–99)

## 2017-07-15 MED ORDER — DULOXETINE HCL 30 MG PO CPEP
30.0000 mg | ORAL_CAPSULE | Freq: Every day | ORAL | Status: DC
  Administered 2017-07-15 – 2017-07-29 (×15): 30 mg via ORAL
  Filled 2017-07-15 (×17): qty 1

## 2017-07-15 MED ORDER — VALPROATE SODIUM 250 MG/5ML PO SOLN
500.0000 mg | Freq: Two times a day (BID) | ORAL | Status: DC
  Administered 2017-07-15 – 2017-07-19 (×8): 500 mg via ORAL
  Filled 2017-07-15 (×9): qty 10

## 2017-07-15 NOTE — Progress Notes (Signed)
Subjective/Complaints: Patient complains that she has numbness in her feet.  She has had this for at least one year.  Patient more interactive but still has very severe dysarthria.  Patient also with emotional lability ROS-  Limited due to dysarthria and cognition Objective: Vital Signs: Blood pressure (!) 148/66, pulse 83, temperature 98.8 F (37.1 C), temperature source Oral, resp. rate 18, height '5\' 5"'$  (1.651 m), weight 97.1 kg (214 lb 1.1 oz), SpO2 98 %. No results found. Results for orders placed or performed during the hospital encounter of 07/13/17 (from the past 72 hour(s))  Glucose, capillary     Status: Abnormal   Collection Time: 07/13/17  9:46 PM  Result Value Ref Range   Glucose-Capillary 137 (H) 65 - 99 mg/dL  CBC WITH DIFFERENTIAL     Status: Abnormal   Collection Time: 07/14/17  5:49 AM  Result Value Ref Range   WBC 4.8 4.0 - 10.5 K/uL   RBC 3.30 (L) 3.87 - 5.11 MIL/uL   Hemoglobin 9.5 (L) 12.0 - 15.0 g/dL   HCT 29.8 (L) 36.0 - 46.0 %   MCV 90.3 78.0 - 100.0 fL   MCH 28.8 26.0 - 34.0 pg   MCHC 31.9 30.0 - 36.0 g/dL   RDW 14.1 11.5 - 15.5 %   Platelets 252 150 - 400 K/uL   Neutrophils Relative % 50 %   Neutro Abs 2.4 1.7 - 7.7 K/uL   Lymphocytes Relative 39 %   Lymphs Abs 1.9 0.7 - 4.0 K/uL   Monocytes Relative 8 %   Monocytes Absolute 0.4 0.1 - 1.0 K/uL   Eosinophils Relative 3 %   Eosinophils Absolute 0.1 0.0 - 0.7 K/uL   Basophils Relative 0 %   Basophils Absolute 0.0 0.0 - 0.1 K/uL    Comment: Performed at [facility], [address], [place], [place] [address]  Comprehensive metabolic panel     Status: Abnormal   Collection Time: 07/14/17  5:49 AM  Result Value Ref Range   Sodium 142 135 - 145 mmol/L   Potassium 3.6 3.5 - 5.1 mmol/L   Chloride 105 101 - 111 mmol/L   CO2 29 22 - 32 mmol/L   Glucose, Bld 120 (H) 65 - 99 mg/dL   BUN 12 6 - 20 mg/dL   Creatinine, Ser 0.95 0.44 - 1.00 mg/dL   Calcium 9.5 8.9 - 10.3 mg/dL   Total Protein 5.6 (L)  6.5 - 8.1 g/dL   Albumin 2.5 (L) 3.5 - 5.0 g/dL   AST 14 (L) 15 - 41 U/L   ALT 10 (L) 14 - 54 U/L   Alkaline Phosphatase 59 38 - 126 U/L   Total Bilirubin 0.6 0.3 - 1.2 mg/dL   GFR calc non Af Amer >60 >60 mL/min   GFR calc Af Amer >60 >60 mL/min    Comment: (NOTE) The eGFR has been calculated using the CKD EPI equation. This calculation has not been validated in all clinical situations. eGFR's persistently <60 mL/min signify possible Chronic Kidney Disease.    Anion gap 8 5 - 15    Comment: Performed at [facility] [address] [place], [place] [address]  Glucose, capillary     Status: Abnormal   Collection Time: 07/14/17  6:20 AM  Result Value Ref Range   Glucose-Capillary 117 (H) 65 - 99 mg/dL  Glucose, capillary     Status: Abnormal   Collection Time: 07/14/17 11:59 AM  Result Value Ref Range  Glucose-Capillary 145 (H) 65 - 99 mg/dL  Glucose, capillary     Status: Abnormal   Collection Time: 07/14/17  4:40 PM  Result Value Ref Range   Glucose-Capillary 120 (H) 65 - 99 mg/dL  Glucose, capillary     Status: Abnormal   Collection Time: 07/14/17  9:04 PM  Result Value Ref Range   Glucose-Capillary 143 (H) 65 - 99 mg/dL  Glucose, capillary     Status: Abnormal   Collection Time: 07/15/17  6:09 AM  Result Value Ref Range   Glucose-Capillary 101 (H) 65 - 99 mg/dL     HEENT: Right facial droop lower Cardio: RRR and no murmur Resp: CTA B/L and Unlabored GI: BS positive and NT, ND Extremity:  No Edema Skin:   Intact Neuro: Alert/Oriented, Abnormal Sensory intact LT in toes, Abnormal Motor 3/5 R delt bi tri grip, 5/5 in LUE, 2- R HF, KE, ADF, 4/5 Left HF, KE, ADF, Dysarthric and Other word finding deficits Light touch sensation intact bilateral hands and feet Musc/Skel:  Other no pain with UE or LE ROM Gen NAD Mood and affect emotionally labile crying spontaneously  Assessment/Plan: 1. Functional deficits secondary to Right hemiparesis , LLE paresisand  dysarthria, Dysphagia and cognitive deficits related to Left periventricular and R paramedian sgital infarcts which require 3+ hours per day of interdisciplinary therapy in a comprehensive inpatient rehab setting. Physiatrist is providing close team supervision and 24 hour management of active medical problems listed below. Physiatrist and rehab team continue to assess barriers to discharge/monitor patient progress toward functional and medical goals. FIM: Function - Bathing Position: Shower Body parts bathed by patient: Right arm, Chest, Abdomen, Front perineal area Body parts bathed by helper: Left arm, Right lower leg, Left lower leg, Back, Buttocks  Function- Upper Body Dressing/Undressing What is the patient wearing?: Pull over shirt/dress Pull over shirt/dress - Perfomed by patient: Thread/unthread right sleeve, Thread/unthread left sleeve, Put head through opening, Pull shirt over trunk Assist Level: Touching or steadying assistance(Pt > 75%) Function - Lower Body Dressing/Undressing What is the patient wearing?: Non-skid slipper socks, Pants Position: Wheelchair/chair at sink Pants- Performed by patient: Thread/unthread left pants leg Pants- Performed by helper: Thread/unthread right pants leg, Pull pants up/down Non-skid slipper socks- Performed by helper: Don/doff right sock, Don/doff left sock Assist for footwear: Maximal assist Assist for lower body dressing: Touching or steadying assistance (Pt > 75%)  Function - Toileting Toileting steps completed by helper: Adjust clothing prior to toileting, Performs perineal hygiene, Adjust clothing after toileting Toileting Assistive Devices: Grab bar or rail  Function - Air cabin crew transfer assistive device: Grab bar Assist level to toilet: Moderate assist (Pt 50 - 74%/lift or lower) Assist level from toilet: Moderate assist (Pt 50 - 74%/lift or lower)  Function - Chair/bed transfer Chair/bed transfer method: Stand  pivot Chair/bed transfer assist level: Touching or steadying assistance (Pt > 75%)  Function - Locomotion: Wheelchair Will patient use wheelchair at discharge?: No Wheelchair activity did not occur: Safety/medical concerns Wheel 50 feet with 2 turns activity did not occur: Safety/medical concerns Wheel 150 feet activity did not occur: Safety/medical concerns Function - Locomotion: Ambulation Assistive device: No device Max distance: 110 ft Assist level: Touching or steadying assistance (Pt > 75%) Assist level: Touching or steadying assistance (Pt > 75%) Assist level: Touching or steadying assistance (Pt > 75%) Walk 150 feet activity did not occur: Safety/medical concerns Assist level: Moderate assist (Pt 50 - 74%)  Function - Comprehension Comprehension: Auditory Comprehension assist  level: Understands basic 50 - 74% of the time/ requires cueing 25 - 49% of the time  Function - Expression Expression: Verbal Expression assist level: Expresses basic 25 - 49% of the time/requires cueing 50 - 75% of the time. Uses single words/gestures.  Function - Social Interaction Social Interaction assist level: Interacts appropriately 50 - 74% of the time - May be physically or verbally inappropriate.  Function - Problem Solving Problem solving assist level: Solves basic 50 - 74% of the time/requires cueing 25 - 49% of the time  Function - Memory Memory assist level: Recognizes or recalls 50 - 74% of the time/requires cueing 25 - 49% of the time Patient normally able to recall (first 3 days only): Current season, That he or she is in a hospital  Medical Problem List and Plan:  1. Right hemiparesis and functional deficits secondary to left periventricular white matter and right frontal lobe infarcts  -CIR PT, OT, SLP ~3wk  LOS 2. DVT Prophylaxis/Anticoagulation: Pharmaceutical: Lovenox '40mg'$  q 24h 3. Chronic HA/Pain Management: Depakote bid with tylenol prn.  Lower extremity paresthesias will  trial Cymbalta 4. Mood: LCSW to follow for evaluation and support.  Emotional lability trial of Cymbalta.  If this is not helpful may consider SSRI 5. Neuropsych: This patient is not fully capable of making decisions on her own behalf.  6. Skin/Wound Care: routine pressure relief measures.  7. Fluids/Electrolytes/Nutrition: Monitor I/O  8. 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 Monitor BS ac/hs. Educate on importance of compliance.  CBG (last 3)  Recent Labs    07/14/17 1640 07/14/17 2104 07/15/17 0609  GLUCAP 120* 143* 101*  controlled 5/2 9. Dyslipidemia: On Crestor.  10. Anemia: Hgb stable at 9.5 on 5/1 11. HTN: Monitor BP bid. Resume lisinopril at lower dose and titrate daily. Vitals:   07/14/17 2101 07/15/17 0127  BP: (!) 183/74 (!) 148/66  Pulse: 80 83  Resp: 18 18  Temp: 98.5 F (36.9 C) 98.8 F (37.1 C)  SpO2: 99% 98%   Still labile, lisinopril restarted , will check ortho vitals   LOS (Days) 2 A FACE TO FACE EVALUATION WAS PERFORMED  Charlett Blake 07/15/2017, 9:01 AM

## 2017-07-15 NOTE — Plan of Care (Signed)
  Problem: Consults Goal: RH STROKE PATIENT EDUCATION Description See Patient Education module for education specifics  Outcome: Progressing   Problem: RH BOWEL ELIMINATION Goal: RH STG MANAGE BOWEL WITH ASSISTANCE Description STG Manage Bowel with Assistance. Outcome: Progressing   Problem: RH BLADDER ELIMINATION Goal: RH STG MANAGE BLADDER WITH ASSISTANCE Description STG Manage Bladder With min. Assistance  Outcome: Progressing   Problem: RH SKIN INTEGRITY Goal: RH STG SKIN FREE OF INFECTION/BREAKDOWN Description With min. assist.  Outcome: Progressing Goal: RH STG MAINTAIN SKIN INTEGRITY WITH ASSISTANCE Description STG Maintain Skin Integrity With min. Assistance.  Outcome: Progressing   Problem: RH SAFETY Goal: RH STG ADHERE TO SAFETY PRECAUTIONS W/ASSISTANCE/DEVICE Description STG Adhere to Safety Precautions With mod.Assistance/Device.  Outcome: Progressing   Problem: RH PAIN MANAGEMENT Goal: RH STG PAIN MANAGED AT OR BELOW PT'S PAIN GOAL Description Less than 3.  Outcome: Progressing   Problem: RH KNOWLEDGE DEFICIT Goal: RH STG INCREASE KNOWLEDGE OF DIABETES Outcome: Progressing Goal: RH STG INCREASE KNOWLEDGE OF HYPERTENSION Outcome: Progressing Goal: RH STG INCREASE KNOWLEDGE OF DYSPHAGIA/FLUID INTAKE Outcome: Progressing   

## 2017-07-15 NOTE — Progress Notes (Signed)
Speech Language Pathology Daily Session Note  Patient Details  Name: Kelly Romero MRN: [id number] Date of Birth: 1965/10/31  Today's Date: 07/15/2017 SLP Individual Time: 7829-5621 SLP Individual Time Calculation (min): 45 min  Short Term Goals: Week 1: SLP Short Term Goal 1 (Week 1): Pt will utilize word finding strategies to convey basic thoughts and ideas with Mod A cues.  SLP Short Term Goal 2 (Week 1): Pt will utilize speech intelligibility strategies with Mod A  to increase speech intelligibility to ~ 90% at the word level.  SLP Short Term Goal 3 (Week 1): Pt will solve semi-complex problem solving tasks with Mod A cues.  SLP Short Term Goal 4 (Week 1): Pt will demonstrate selective attention in moderately distracting environment for 30 minutes iwth Mod A cues.  SLP Short Term Goal 5 (Week 1): Pt will consume trials of dysphagia 2 with Mod A cues for use of compensatory swallow strategies to effectively clear oral residue.   Skilled Therapeutic Interventions: Skilled treatment session focused on dysphagia and speech communication goals. SLP facilitated session by providing skilled observation of pt consuming dysphagia 1 breakfast tray supplemented with graham crackers. Pt required Min A cues to monitor and correct right anterior spillage with both food and liquids. Pt with increased lingual manipulation of graham crackers and no accumulating buccal residue. Recommend skilled observation with full tray of dysphagia 2 at next available session. SLP also facilitated session by providing Max A cues to increase vocal intensity to achieve ~ 50% speech intelligibility at the phrase level. Pt continues to be emotional with questions regarding recovery. Education and emotional support given. Pt was left upright in bed with all needs within reach. Continue per current plan of care.      Function:  Eating Eating   Modified Consistency Diet: Yes Eating Assist Level: More than reasonable amount  of time;Set up assist for;Supervision or verbal cues   Eating Set Up Assist For: Opening containers       Cognition Comprehension Comprehension assist level: Understands basic 50 - 74% of the time/ requires cueing 25 - 49% of the time  Expression   Expression assist level: Expresses basic 25 - 49% of the time/requires cueing 50 - 75% of the time. Uses single words/gestures.  Social Interaction Social Interaction assist level: Interacts appropriately 50 - 74% of the time - May be physically or verbally inappropriate.  Problem Solving Problem solving assist level: Solves basic 50 - 74% of the time/requires cueing 25 - 49% of the time  Memory Memory assist level: Recognizes or recalls 50 - 74% of the time/requires cueing 25 - 49% of the time    Pain    Therapy/Group: Individual Therapy  [name] 07/15/2017, 9:17 AM

## 2017-07-15 NOTE — Progress Notes (Signed)
Physical Therapy Session Note  Patient Details  Name: Kelly Romero MRN: [id number] Date of Birth: [birth year]  Today's Date: 07/15/2017 PT Individual Time: 4098-1191 PT Individual Time Calculation (min): 45 min   Short Term Goals: Week 1:  PT Short Term Goal 1 (Week 1): Pt will ambulate 75 ft with LRAD & supervision. PT Short Term Goal 2 (Week 1): Pt will negotiate 12 steps with B rails & mod assist for strengthening purposes.  Skilled Therapeutic Interventions/Progress Updates:    Pt initially very tearful stating her R arm and leg aren't working. Re-educated pt on reason for this (CVA) and why she is on rehab. Pt verbalized understanding and emotional support provided as well as encouraged pt throughout session demonstrating all mobility that she does have on the R. Focused on NMR throughout to address postural control during transitional movements as well as during dynamic sitting and standing balance, gait training without AD with cues for increasing cadence and increased R step length while maintaining upright trunk, and nustep for reciprocal movement pattern retraining, attention to RUE, and increasing speed of movement (5 min on level 3). Pt with impaired sustained attention noted functionally during tasks. Pt also requires mod to max verbal cues for attention to RUE and obstacles on R during functional mobility. Pt's son in law observed session and education provided to him as well.   Five times Sit to Stand Test (FTSS) Method: Use a straight back chair with a solid seat that is 16-18" high. Ask participant to sit on the chair with arms folded across their chest.   Instructions: "Stand up and sit down as quickly as possible 5 times, keeping your arms folded across your chest."   Measurement: Stop timing when the participant stands the 5th time.  TIME: __42____ (in seconds) with use of UE's for support  Times > 13.6 seconds is associated with increased disability and morbidity  (Guralnik, 2000) Times > 15 seconds is predictive of recurrent falls in healthy individuals aged 66 and older (Buatois, et al., 2008) Normal performance values in community dwelling individuals aged 62 and older (Bohannon, 2006): o 60-69 years: 11.4 seconds o 70-79 years: 12.6 seconds o 80-89 years: 14.8 seconds  MCID: ? 2.3 seconds for Vestibular Disorders Wray Kearns, 2006)   Therapy Documentation Precautions:  Precautions Precautions: Fall Restrictions Weight Bearing Restrictions: No  Pain: Pain Assessment Pain Scale: 0-10 Pain Score: 0-No pain   See Function Navigator for Current Functional Status.   Therapy/Group: Individual Therapy  Karolee Stamps Darrol Poke, PT, DPT  07/15/2017, 12:06 PM

## 2017-07-15 NOTE — IPOC Note (Signed)
Overall Plan of Care [facility]) Patient Details Name: Kelly Romero MRN: [id number] DOB: 1966-03-07  Admitting Diagnosis: <principal problem not specified>  Hospital Problems: Active Problems:   Cryptogenic stroke [facility])   Thrombotic stroke (HCC)   Right hemiparesis (HCC)     Functional Problem List: Nursing Safety, Endurance  PT Balance, Perception, Behavior, Safety, Sensory, Endurance, Motor  OT Balance, Behavior, Perception, Cognition, Safety, Sensory, Endurance, Motor, Vision  SLP Cognition, Endurance  TR         Basic ADL's: OT Eating, Grooming, Bathing, Dressing, Toileting     Advanced  ADL's: OT       Transfers: PT Bed Mobility, Bed to Chair, [employer], [employer], Customer service manager, Research scientist (life sciences): PT Psychologist, prison and probation services, Stairs, Ambulation     Additional Impairments: OT Fuctional Use of Upper Extremity  SLP Swallowing, Communication, Social Cognition expression Problem Solving  TR      Anticipated Outcomes Item Anticipated Outcome  Self Feeding Supervision  Swallowing  Min A with least restrictive diet   Basic self-care  Supervision  Toileting  Supervision   Bathroom Transfers Supervision  Bowel/Bladder  Continent to bowel and bladder with min. assist.  Transfers  supervision<>min assist with LRAD  Locomotion  supervision<>min assist with LRAD  Communication  Min A for basic wants and needs  Cognition  Min A for basic   Pain  Less than 3,on 1 to 10 scale.  Safety/Judgment  Free from fall during her stay in rehab   Therapy Plan: PT Intensity: Minimum of 1-2 x/day ,45 to 90 minutes PT Frequency: 5 out of 7 days PT Duration Estimated Length of Stay: 2 weeks OT Intensity: Minimum of 1-2 x/day, 45 to 90 minutes OT Frequency: 5 out of 7 days OT Duration/Estimated Length of Stay: 2.5 weeks SLP Intensity: Minumum of 1-2 x/day, 30 to 90 minutes SLP Frequency: 3 to 5 out of 7 days SLP Duration/Estimated Length of Stay: 2.5 to 3 weeks    Team Interventions: Nursing Interventions Patient/Family Education, Dysphagia/Aspiration Printmaker, Psychosocial Support, Discharge Planning, Disease Management/Prevention  PT interventions Ambulation/gait training, Community reintegration, Fish farm manager, Neuromuscular re-education, Psychosocial support, Stair training, UE/LE Strength taining/ROM, Wheelchair propulsion/positioning, UE/LE Coordination activities, Therapeutic Activities, Skin care/wound management, Pain management, Functional electrical stimulation, Discharge planning, Warden/ranger, Cognitive remediation/compensation, Disease management/prevention, Functional mobility training, Patient/family education, Therapeutic Exercise, Visual/perceptual remediation/compensation, Splinting/orthotics  OT Interventions Balance/vestibular training, Discharge planning, Functional electrical stimulation, Pain management, Self Care/advanced ADL retraining, Therapeutic Activities, UE/LE Coordination activities, Visual/perceptual remediation/compensation, Therapeutic Exercise, Patient/family education, Functional mobility training, Disease mangement/prevention, Cognitive remediation/compensation, Community reintegration, Fish farm manager, Neuromuscular re-education, Psychosocial support, UE/LE Strength taining/ROM  SLP Interventions Cognitive remediation/compensation, Cueing hierarchy, Dysphagia/aspiration precaution training, Functional tasks, Patient/family education, Therapeutic Activities  TR Interventions    SW/CM Interventions Discharge Planning, Psychosocial Support, Patient/Family Education   Barriers to Discharge MD  Medical stability, Behavior and cognitive deficits from multiple CVA  Nursing      PT      OT      SLP      SW       Team Discharge Planning: Destination: PT-Home ,OT- Home , SLP-Home Projected Follow-up: PT-Home health PT, 24 hour supervision/assistance, OT-   Home health OT, SLP-Home Health SLP, 24 hour supervision/assistance Projected Equipment Needs: PT-To be determined, OT- Tub/shower bench, To be determined, SLP-None recommended by SLP Equipment Details: PT- , OT-Pt has BSC Patient/family involved in discharge planning: PT- Patient,  OT-Patient, Family member/caregiver, SLP-Patient  MD ELOS: 11-14d  Medical Rehab Prognosis:  Excellent Assessment:   52 year old female with history of HTN, T2DM, depression, HA,medication non-compliance, multiple CVAs most recent 2/12-2/15/19 RH and discharge from [facility] on 07/10/17 due to acute left hemisphere infarcts. She had worsening of slurred speech and right sided weakness and was admitted to [facility] on 07/11/17 for work up. MRI brain repeated and showed few new strokes in parasagittal cortical and subcortical left frontal lobe. Patient with negative work up for vasculitis, hypercoagulable state as well as spinal tap. Dr. Pearlean Brownie felt that storke due to advanced intracranial atherosclerosis and recommended DAPT for 3 months. Loop recorder placed by Dr. Johney Frame to rule out A fib. Patient with right sided weakness with cognitive deficits, lability, lethargy affecting ability to carry out ADLs and mobility   Now requiring 24/7 Rehab RN,MD, as well as CIR level PT, OT and SLP.  Treatment team will focus on ADLs and mobility with goals set at MinA/Sup   See Team Conference Notes for weekly updates to the plan of care

## 2017-07-15 NOTE — Progress Notes (Addendum)
Occupational Therapy Session Note  Patient Details  Name: GENOLA YUILLE MRN: [id number] Date of Birth: 07/06/1965  Today's Date: 07/15/2017 OT Individual Time: 4098-1191 and 1345-1430 OT Individual Time Calculation (min): 60 min and 45 min   Short Term Goals: Week 1:  OT Short Term Goal 1 (Week 1): Pt will maintain dynamic standing balance during toileting task with CGA OT Short Term Goal 2 (Week 1): Pt will complete LB dressing with steadying assist only OT Short Term Goal 3 (Week 1): Pt will use R UE during bathing task with min cuing for awareness OT Short Term Goal 4 (Week 1): Pt will consistently be oriented x4 with min questioning cues  Skilled Therapeutic Interventions/Progress Updates:    Session One: Pt seen for OT session focusing on functional mobility and ADL re-training. Pt in supine upon arrival, agreeable to tx session. She transferred to EOB with supervision using hospital bed functions. Throughout session, she ambulated with min-mod HHA on R due to R lean and discoordination. She completed toileting task with steadying assist for dynamic balance, difficulty using R UE due to ataxia though pt attempts to complete all tasks with R UE, unable to problem solcing modivited method when unsuccessful with R UE. She dressed seated in chair, requiring significantly increased time and assist when using R UE. During grooming tasks, noted oral apraxia with pt unable to spit to clear oral residue, SLP made aware.  Attempted to complete functional reaching task with R UE, however, pt unable to motor plan/ initiate movement to reach for item as well as potential R UE weakness though she is able to attempt functional use with R UE. Pt left seated in w/c at end of session, chair alarm and QRB donned, all needs in reach.   Session Two: Pt seen for OT session focusing on neuro muscular re-education with R UE. Pt sitting up in w/c upon arrival, alert and agreeable to tx session. She was unable  to recall events of previous session with this therapist from the AM.  Pt taken to therapy gym total A in w/c for time and energy conservation. Mod stand pivot transfer to EOM. Pt able to maintain static sitting balance EOM with supervision. Completed functional reaching and grasp/release activity using empty plastic cup. Pt able to initiate shoulder flexion in order to reach for cup, however, required min A, significantly increased time and max cuing to extend digits minimally in order to get functional grasp around cup. She was able to place it next to her, though difficulty extending digits to release. Pt unable to complete functional grasp/release and full shoulder flexion in structured activity, however, has demonstrated semi functional movements during ADL tasks though remain at un-functional level due to severe R inattention to UE and apraxia.  Completed lateral leans x10 onto R forearm, requiring min A to come into midline sitting focusing on weightbearing to R UE/LE.  Pt returned to room at end of session, left seated in w/c per request, QRB and chair alarm on.   Therapy Documentation Precautions:  Precautions Precautions: Fall Restrictions Weight Bearing Restrictions: No Pain:    No/denies pain  See Function Navigator for Current Functional Status.   Therapy/Group: Individual Therapy  Veida Spira L 07/15/2017, 7:04 AM

## 2017-07-16 ENCOUNTER — Inpatient Hospital Stay (HOSPITAL_COMMUNITY): Payer: Medicaid Other | Admitting: Occupational Therapy

## 2017-07-16 ENCOUNTER — Ambulatory Visit (HOSPITAL_COMMUNITY): Payer: Medicaid Other | Admitting: Speech Pathology

## 2017-07-16 ENCOUNTER — Inpatient Hospital Stay (HOSPITAL_COMMUNITY): Payer: Medicaid Other | Admitting: Physical Therapy

## 2017-07-16 LAB — GLUCOSE, CAPILLARY
GLUCOSE-CAPILLARY: 103 mg/dL — AB (ref 65–99)
GLUCOSE-CAPILLARY: 125 mg/dL — AB (ref 65–99)
GLUCOSE-CAPILLARY: 96 mg/dL (ref 65–99)
Glucose-Capillary: 108 mg/dL — ABNORMAL HIGH (ref 65–99)

## 2017-07-16 NOTE — Progress Notes (Signed)
Speech Language Pathology Daily Session Note  Patient Details  Name: Kelly Romero MRN: [id number] Date of Birth: [birth year]  Today's Date: 07/16/2017 SLP Individual Time: 1100-1200 SLP Individual Time Calculation (min): 60 min  Short Term Goals: Week 1: SLP Short Term Goal 1 (Week 1): Pt will utilize word finding strategies to convey basic thoughts and ideas with Mod A cues.  SLP Short Term Goal 2 (Week 1): Pt will utilize speech intelligibility strategies with Mod A  to increase speech intelligibility to ~ 90% at the word level.  SLP Short Term Goal 3 (Week 1): Pt will solve semi-complex problem solving tasks with Mod A cues.  SLP Short Term Goal 4 (Week 1): Pt will demonstrate selective attention in moderately distracting environment for 30 minutes iwth Mod A cues.  SLP Short Term Goal 5 (Week 1): Pt will consume trials of dysphagia 2 with Mod A cues for use of compensatory swallow strategies to effectively clear oral residue.   Skilled Therapeutic Interventions: Skilled treatment session focused on dysphagia and cognition goals. SLP facilitated session by providing skilled observation of pt consuming thin liquids via cup. Prior to consumption, nursing with new reports of pt coughing on thin liquids with breakfast and coughing on her own salvia throughout morning. Pt with anterior right spillage, oral holding, decreased hyolaryngeal movement, base of tongue pumping, multiple swallows and immediate cough with thin liquids. Trials of nectar thick liquids provided and pt continues with oral holding and decreased hyolaryngeal movement but no coughing. Recommend downgrading to nectar thick liquids and instrumental study on next available date. Pt with overall significant oral motor deficits this session and < 25% intelligibility at the word/phrase level. Pt able ot cognitive perform task of sorting paper money according to groups but frequently needed cues to sustain attention to task.  Pt was  returned to room, education posted in room, provided to nursing and pt on diet downgrade and POC for instrumental swallow study.      Function:  Eating Eating   Modified Consistency Diet: Yes Eating Assist Level: More than reasonable amount of time;Set up assist for;Supervision or verbal cues   Eating Set Up Assist For: Opening containers       Cognition Comprehension Comprehension assist level: Understands basic 50 - 74% of the time/ requires cueing 25 - 49% of the time  Expression   Expression assist level: Expresses basis less than 25% of the time/requires cueing >75% of the time.  Social Interaction Social Interaction assist level: Interacts appropriately 50 - 74% of the time - May be physically or verbally inappropriate.  Problem Solving Problem solving assist level: Solves basic 50 - 74% of the time/requires cueing 25 - 49% of the time  Memory Memory assist level: Recognizes or recalls 50 - 74% of the time/requires cueing 25 - 49% of the time    Pain Pain Assessment Pain Scale: 0-10 Pain Score: 0-No pain  Therapy/Group: Individual Therapy  Maryrose Colvin 07/16/2017, 12:43 PM

## 2017-07-16 NOTE — Progress Notes (Signed)
Occupational Therapy Session Note  Patient Details  Name: Kelly Romero MRN: [id number] Date of Birth: 08-Sep-1965  Today's Date: 07/16/2017 OT Individual Time: 1914-7829 and 1430-1500 OT Individual Time Calculation (min): 60 min and 30 min   Short Term Goals: Week 1:  OT Short Term Goal 1 (Week 1): Pt will maintain dynamic standing balance during toileting task with CGA OT Short Term Goal 2 (Week 1): Pt will complete LB dressing with steadying assist only OT Short Term Goal 3 (Week 1): Pt will use R UE during bathing task with min cuing for awareness OT Short Term Goal 4 (Week 1): Pt will consistently be oriented x4 with min questioning cues  Skilled Therapeutic Interventions/Progress Updates:    Session one: Pt seen for OT ADL bathing/dressing session. Pt asleep in supine upon arrival, easily awoken and agreeable to tx session. She transferred to sitting EOB with assist and multimodal cuing for management and sequencing of movements of bringing R UE/LE off EOB. She ambulated throughout session with HHA on R, requiring assist to place R hand in therapist's hand secondary to apraxia. She gathered clothing items from drawers, min A to use R UE to obtain items, pt demonstrates functional grasp to obtain socks with R hand with significantly increased time. Required VCs for attention tot ask throughout. She required mod A for standing balance at toilet due to R lean. She bathed seated on BSC in shower. Pt able to initiate use of R UE into functional task, however, due to weakness and apraxia is unable to use at functional level. She stood with mod steadying assist to complete buttock hygiene. She returned to chair to dress, with snigificantly increased time able to orient and don shirt with set-up. Min A for clothing management when threading pants on and total A to pull pants up. Pt left seated in w/c at end of session, QRB and chair alarm on, all needs in reach, RN made aware pt ready for  breakfast.   Session Two: Pt seen for OT session focusing on self-feeding, attention to R UE, and task initiation. Pt sitting up in w/c upon arrival, lunch tray just arriving, pt willing to eat lunch. She attempted use of self feeding with R UE, however, due to apraxia was unable to bring food entirely to mouth, stopping ~75% of the way to mouth, requiring min A to bring remainder of way to mouth. Max cuing required for intiation of swallowing and pocketing on R side of mouth. Pt able to self feed with increased time using L UE, still requiring cues for swallowing.  Completed various reaching and ROM movements with R UE, pt demonstrates full ROM against gravity with R UE, however, demonstrates difficulty incorporating it into R UE. Provided pt with her cell phone, demonstrates good intiation in manipulating phone with B UEs.  Pt left seated in w/c at end of session, all needs in reach with QRB and chair alarm on.  Therapy Documentation Precautions:  Precautions Precautions: Fall Restrictions Weight Bearing Restrictions: No Pain:    No/denies pain  See Function Navigator for Current Functional Status.   Therapy/Group: Individual Therapy  Leylanie Woodmansee L 07/16/2017, 7:05 AM

## 2017-07-16 NOTE — Progress Notes (Signed)
Physical Therapy Session Note  Patient Details  Name: Kelly Romero MRN: [id number] Date of Birth: 10-28-1965  Today's Date: 07/16/2017 PT Individual Time: 6578-4696 PT Individual Time Calculation (min): 72 min   Short Term Goals: Week 1:  PT Short Term Goal 1 (Week 1): Pt will ambulate 75 ft with LRAD & supervision. PT Short Term Goal 2 (Week 1): Pt will negotiate 12 steps with B rails & mod assist for strengthening purposes.  Skilled Therapeutic Interventions/Progress Updates:  Pt received in w/c & agreeable to tx. No c/o pain but pt reports she's hungry but correct food tray not on unit yet. Gait training x 20 ft without AD with min assist. Provided pt with RW and gait training x 100 ft with RW & min assist with cuing for increased step length RLE but pt with poor return demo. Pt demonstrates short step length, shuffled gait, minimal heel strike RLE, and R inattention during task despite cuing. Pt then becoming tearful, reporting she is hungry. Provided pt with yogurt until lunch tray arrives. Pt opened spoon and container of yogurt utilizing BUE and extra time. Pt consumed yogurt with RUE but required assistance to pick up and grasp spoon. Pt able to touch chin with finger but when holding spoon in hand pt able to reach ~3 inches in front of mouth before freezing. Therapist provides tactile cuing at hand & elbow and verbal cuing but pt unable to fully bring spoon to mouth or vice versa without assistance. After consuming snack pt utilized cybex kinetron in sitting and progressing to standing with BUE support. Activity focused on BLE strengthening, NMR, & weight shifting but pt only able to perform a few steps while standing before electing to sit down & reporting fatigue. After a few trials pt asks to return to room 2/2 bowel incontinence. Pt ambulates within room & bathroom without AD & min assist and requires max assist to pull pants & brief down R side of hips. Pt with smear of BM noted. Pt  left on toilet in handoff to RN.  Pt demonstrates impulsivity during session, attempting to stand on kinetron without assistance.   Therapy Documentation Precautions:  Precautions Precautions: Fall Restrictions Weight Bearing Restrictions: No   See Function Navigator for Current Functional Status.   Therapy/Group: Individual Therapy  Sandi Mariscal 07/16/2017, 4:33 PM

## 2017-07-16 NOTE — Progress Notes (Signed)
Pt coughing during breakfast with increased oral secretions. Suction setup at bedside. LS CTA. Happi SLP aware and is assessing lunch feeding. Ronnette Juniper, LPN

## 2017-07-16 NOTE — Plan of Care (Signed)
  Problem: Consults Goal: RH STROKE PATIENT EDUCATION Description See Patient Education module for education specifics  Outcome: Progressing   Problem: RH BOWEL ELIMINATION Goal: RH STG MANAGE BOWEL WITH ASSISTANCE Description STG Manage Bowel with Assistance. Outcome: Progressing   Problem: RH BLADDER ELIMINATION Goal: RH STG MANAGE BLADDER WITH ASSISTANCE Description STG Manage Bladder With min. Assistance  Outcome: Progressing   Problem: RH SKIN INTEGRITY Goal: RH STG SKIN FREE OF INFECTION/BREAKDOWN Description With min. assist.  Outcome: Progressing Goal: RH STG MAINTAIN SKIN INTEGRITY WITH ASSISTANCE Description STG Maintain Skin Integrity With min. Assistance.  Outcome: Progressing   Problem: RH SAFETY Goal: RH STG ADHERE TO SAFETY PRECAUTIONS W/ASSISTANCE/DEVICE Description STG Adhere to Safety Precautions With mod.Assistance/Device.  Outcome: Progressing   Problem: RH PAIN MANAGEMENT Goal: RH STG PAIN MANAGED AT OR BELOW PT'S PAIN GOAL Description Less than 3.  Outcome: Progressing   Problem: RH KNOWLEDGE DEFICIT Goal: RH STG INCREASE KNOWLEDGE OF DIABETES Outcome: Progressing Goal: RH STG INCREASE KNOWLEDGE OF HYPERTENSION Outcome: Progressing Goal: RH STG INCREASE KNOWLEDGE OF DYSPHAGIA/FLUID INTAKE Outcome: Progressing   

## 2017-07-16 NOTE — Progress Notes (Signed)
Subjective/Complaints:  No issues overnite, discussed BP readings, pt does not remember if she was dizzy while standing for orthostatic Discussed results with RN ROS-  Limited due to dysarthria and cognition Objective: Vital Signs: Blood pressure 102/65, pulse 97, temperature 97.6 F (36.4 C), temperature source Oral, resp. rate 20, height _0  (1.651 m), weight 97.1 kg (214 lb 1.1 oz), SpO2 96 %. No results found. Results for orders placed or performed during the hospital encounter of 07/13/17 (from the past 72 hour(s))  Glucose, capillary     Status: Abnormal   Collection Time: 07/13/17  9:46 PM  Result Value Ref Range   Glucose-Capillary 137 (H) 65 - 99 mg/dL  CBC WITH DIFFERENTIAL     Status: Abnormal   Collection Time: 07/14/17  5:49 AM  Result Value Ref Range   WBC 4.8 4.0 - 10.5 K/uL   RBC 3.30 (L) 3.87 - 5.11 MIL/uL   Hemoglobin 9.5 (L) 12.0 - 15.0 g/dL   HCT 29.8 (L) 36.0 - 46.0 %   MCV 90.3 78.0 - 100.0 fL   MCH 28.8 26.0 - 34.0 pg   MCHC 31.9 30.0 - 36.0 g/dL   RDW 14.1 11.5 - 15.5 %   Platelets 252 150 - 400 K/uL   Neutrophils Relative % 50 %   Neutro Abs 2.4 1.7 - 7.7 K/uL   Lymphocytes Relative 39 %   Lymphs Abs 1.9 0.7 - 4.0 K/uL   Monocytes Relative 8 %   Monocytes Absolute 0.4 0.1 - 1.0 K/uL   Eosinophils Relative 3 %   Eosinophils Absolute 0.1 0.0 - 0.7 K/uL   Basophils Relative 0 %   Basophils Absolute 0.0 0.0 - 0.1 K/uL    Comment: Performed at [facility], [address] [place], [place] [address]  Comprehensive metabolic panel     Status: Abnormal   Collection Time: 07/14/17  5:49 AM  Result Value Ref Range   Sodium 142 135 - 145 mmol/L   Potassium 3.6 3.5 - 5.1 mmol/L   Chloride 105 101 - 111 mmol/L   CO2 29 22 - 32 mmol/L   Glucose, Bld 120 (H) 65 - 99 mg/dL   BUN 12 6 - 20 mg/dL   Creatinine, Ser 0.95 0.44 - 1.00 mg/dL   Calcium 9.5 8.9 - 10.3 mg/dL   Total Protein 5.6 (L) 6.5 - 8.1 g/dL   Albumin 2.5 (L) 3.5 - 5.0 g/dL   AST 14  (L) 15 - 41 U/L   ALT 10 (L) 14 - 54 U/L   Alkaline Phosphatase 59 38 - 126 U/L   Total Bilirubin 0.6 0.3 - 1.2 mg/dL   GFR calc non Af Amer >60 >60 mL/min   GFR calc Af Amer >60 >60 mL/min    Comment: (NOTE) The eGFR has been calculated using the CKD EPI equation. This calculation has not been validated in all clinical situations. eGFR's persistently <60 mL/min signify possible Chronic Kidney Disease.    Anion gap 8 5 - 15    Comment: Performed at [facility] [address], [place], [place] [address]  Glucose, capillary     Status: Abnormal   Collection Time: 07/14/17  6:20 AM  Result Value Ref Range   Glucose-Capillary 117 (H) 65 - 99 mg/dL  Glucose, capillary     Status: Abnormal   Collection Time: 07/14/17 11:59 AM  Result Value Ref Range   Glucose-Capillary 145 (H) 65 - 99 mg/dL  Glucose, capillary  Status: Abnormal   Collection Time: 07/14/17  4:40 PM  Result Value Ref Range   Glucose-Capillary 120 (H) 65 - 99 mg/dL  Glucose, capillary     Status: Abnormal   Collection Time: 07/14/17  9:04 PM  Result Value Ref Range   Glucose-Capillary 143 (H) 65 - 99 mg/dL  Glucose, capillary     Status: Abnormal   Collection Time: 07/15/17  6:09 AM  Result Value Ref Range   Glucose-Capillary 101 (H) 65 - 99 mg/dL  Glucose, capillary     Status: Abnormal   Collection Time: 07/15/17 11:36 AM  Result Value Ref Range   Glucose-Capillary 145 (H) 65 - 99 mg/dL  Glucose, capillary     Status: None   Collection Time: 07/15/17  5:02 PM  Result Value Ref Range   Glucose-Capillary 81 65 - 99 mg/dL  Glucose, capillary     Status: None   Collection Time: 07/15/17  9:05 PM  Result Value Ref Range   Glucose-Capillary 94 65 - 99 mg/dL  Glucose, capillary     Status: Abnormal   Collection Time: 07/16/17  6:27 AM  Result Value Ref Range   Glucose-Capillary 103 (H) 65 - 99 mg/dL     HEENT: Right facial droop lower Cardio: RRR and no murmur Resp: CTA B/L and Unlabored GI: BS  positive and NT, ND Extremity:  No Edema Skin:   Intact Neuro: Alert/Oriented, Abnormal Sensory intact LT in toes, Abnormal Motor 3/5 R delt bi tri grip, 5/5 in LUE, 2- R HF, KE, ADF, 4/5 Left HF, KE, ADF, Dysarthric and Other word finding deficits Light touch sensation intact bilateral hands and feet Musc/Skel:  Other no pain with UE or LE ROM Gen NAD Mood and affect emotionally labile crying spontaneously  Assessment/Plan: 1. 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 Physiatrist is providing close team supervision and 24 hour management of active medical problems listed below. Physiatrist and rehab team continue to assess barriers to discharge/monitor patient progress toward functional and medical goals. FIM: Function - Bathing Position: Shower Body parts bathed by patient: Chest, Abdomen, Front perineal area, Right upper leg, Left upper leg Body parts bathed by helper: Left arm, Right lower leg, Left lower leg, Back, Buttocks, Right arm  Function- Upper Body Dressing/Undressing What is the patient wearing?: Pull over shirt/dress Pull over shirt/dress - Perfomed by patient: Thread/unthread left sleeve, Put head through opening, Thread/unthread right sleeve, Pull shirt over trunk Pull over shirt/dress - Perfomed by helper: Thread/unthread right sleeve, Pull shirt over trunk Assist Level: Touching or steadying assistance(Pt > 75%) Function - Lower Body Dressing/Undressing What is the patient wearing?: Pants, Socks, Shoes Position: Wheelchair/chair at sink Pants- Performed by patient: Thread/unthread right pants leg, Thread/unthread left pants leg Pants- Performed by helper: Pull pants up/down Non-skid slipper socks- Performed by helper: Don/doff right  sock, Don/doff left sock Socks - Performed by helper: Don/doff right sock, Don/doff left sock Shoes - Performed by helper: Don/doff right shoe, Don/doff left shoe, Fasten right, Fasten left Assist for footwear: Maximal assist Assist for lower body dressing: Touching or steadying assistance (Pt > 75%)  Function - Toileting Toileting steps completed by helper: Adjust clothing prior to toileting, Performs perineal hygiene, Adjust clothing after toileting Toileting Assistive Devices: Grab bar or rail Assist level: Touching or steadying assistance (Pt.75%)  Function - Air cabin crew transfer assistive device: Grab bar Assist level to toilet: Moderate assist (Pt 50 - 74%/lift or lower) Assist level from toilet: Moderate assist (Pt 50 - 74%/lift or lower)  Function - Chair/bed transfer Chair/bed transfer method: Stand pivot, Ambulatory Chair/bed transfer assist level: Touching or steadying assistance (Pt > 75%) Chair/bed transfer assistive device: Armrests  Function - Locomotion: Wheelchair Will patient use wheelchair at discharge?: No Wheelchair activity did not occur: Safety/medical concerns Wheel 50 feet with 2 turns activity did not occur: Safety/medical concerns Wheel 150 feet activity did not occur: Safety/medical concerns Function - Locomotion: Ambulation Assistive device: No device Max distance: 150' Assist level: Touching or steadying assistance (Pt > 75%) Assist level: Touching or steadying assistance (Pt > 75%) Assist level: Moderate assist (Pt 50 - 74%) Walk 150 feet activity did not occur: Safety/medical concerns Assist level: Moderate assist (Pt 50 - 74%) Assist level: Moderate assist (Pt 50 - 74%)  Function - Comprehension Comprehension: Auditory Comprehension assist level: Understands basic 50 - 74% of the time/ requires cueing 25 - 49% of the time  Function - Expression Expression: Verbal Expression assist level: Expresses basic 25 - 49% of the  time/requires cueing 50 - 75% of the time. Uses single words/gestures.  Function - Social Interaction Social Interaction assist level: Interacts appropriately 50 - 74% of the time - May be physically or verbally inappropriate.  Function - Problem Solving Problem solving assist level: Solves basic 50 - 74% of the time/requires cueing 25 - 49% of the time  Function - Memory Memory assist level: Recognizes or recalls 50 - 74% of the time/requires cueing 25 - 49% of the time Patient normally able to recall (first 3 days only): Current season, That he or she is in a hospital  Medical Problem List and Plan:  1. Right hemiparesis and functional deficits secondary to left periventricular white matter and right frontal lobe infarcts  -CIR PT, OT, SLP, cognition ( reduced memory)  may interfere with progress in therapy 2. DVT Prophylaxis/Anticoagulation: Pharmaceutical: Lovenox 10m q 24h 3. Chronic HA/Pain Management: Depakote bid with tylenol prn.  Lower extremity paresthesias will trial Cymbalta 4. Mood: LCSW to follow for evaluation and support.  Emotional lability trial of Cymbalta.  If this is not helpful may consider SSRI 5. Neuropsych: This patient is not fully capable of making decisions on her own behalf.  6. Skin/Wound Care: routine pressure relief measures.  7. Fluids/Electrolytes/Nutrition: Monitor I/O  8. 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 Monitor BS ac/hs. Educate on importance of compliance.  CBG (last 3)  Recent Labs    07/15/17 1702 07/15/17 2105 07/16/17 0627  GLUCAP 81 94 103*  controlled 5/3 9. Dyslipidemia: On Crestor.  10. Anemia: Hgb stable at 9.5 on 5/1 11. HTN: Monitor BP bid. Resume lisinopril at lower dose and titrate daily. Vitals:   07/16/17 0512 07/16/17 0514  BP: (!) 155/74 102/65  Pulse: 85 97  Resp:    Temp:    SpO2: 96%    Above readings are sitting 155/74  to standing 102/65, order TEDs, would not tighten BP control   LOS (Days) 3 A FACE TO FACE EVALUATION WAS PERFORMED  ACharlett Blake5/05/2017, 8:44 AM

## 2017-07-17 ENCOUNTER — Inpatient Hospital Stay (HOSPITAL_COMMUNITY): Payer: Medicaid Other | Admitting: Physical Therapy

## 2017-07-17 ENCOUNTER — Inpatient Hospital Stay (HOSPITAL_COMMUNITY): Payer: Medicaid Other | Admitting: Speech Pathology

## 2017-07-17 ENCOUNTER — Inpatient Hospital Stay (HOSPITAL_COMMUNITY): Payer: Medicaid Other | Admitting: Occupational Therapy

## 2017-07-17 DIAGNOSIS — D62 Acute posthemorrhagic anemia: Secondary | ICD-10-CM

## 2017-07-17 DIAGNOSIS — I6339 Cerebral infarction due to thrombosis of other cerebral artery: Secondary | ICD-10-CM

## 2017-07-17 DIAGNOSIS — I951 Orthostatic hypotension: Secondary | ICD-10-CM

## 2017-07-17 DIAGNOSIS — E1165 Type 2 diabetes mellitus with hyperglycemia: Secondary | ICD-10-CM

## 2017-07-17 DIAGNOSIS — E8809 Other disorders of plasma-protein metabolism, not elsewhere classified: Secondary | ICD-10-CM

## 2017-07-17 DIAGNOSIS — E46 Unspecified protein-calorie malnutrition: Secondary | ICD-10-CM

## 2017-07-17 DIAGNOSIS — R4586 Emotional lability: Secondary | ICD-10-CM

## 2017-07-17 DIAGNOSIS — E1142 Type 2 diabetes mellitus with diabetic polyneuropathy: Secondary | ICD-10-CM

## 2017-07-17 LAB — GLUCOSE, CAPILLARY
GLUCOSE-CAPILLARY: 85 mg/dL (ref 65–99)
Glucose-Capillary: 94 mg/dL (ref 65–99)
Glucose-Capillary: 105 mg/dL — ABNORMAL HIGH (ref 65–99)
Glucose-Capillary: 81 mg/dL (ref 65–99)

## 2017-07-17 MED ORDER — PRO-STAT SUGAR FREE PO LIQD
30.0000 mL | Freq: Two times a day (BID) | ORAL | Status: DC
  Administered 2017-07-17 – 2017-08-06 (×24): 30 mL via ORAL
  Filled 2017-07-17 (×38): qty 30

## 2017-07-17 NOTE — Progress Notes (Signed)
Physical Therapy Session Note  Patient Details  Name: Kelly Romero MRN: [id number] Date of Birth: 1966/01/27  Today's Date: 07/17/2017 PT Individual Time: 1445-1530 PT Individual Time Calculation (min): 45 min   Short Term Goals: Week 1:  PT Short Term Goal 1 (Week 1): Pt will ambulate 75 ft with LRAD & supervision. PT Short Term Goal 2 (Week 1): Pt will negotiate 12 steps with B rails & mod assist for strengthening purposes.  Skilled Therapeutic Interventions/Progress Updates:    Pt seated in w/c in room, agreeable to PT. No complaints of pain. Ascend/descend 4 stairs with 2 handrails and mod assist for balance, v/c for safety as pt does not place RLE all the way on the step when descending. Pt reports incontinence of bowel. Sit to stand with min assist to grab bar in bathroom, pt is able to adjust clothing but is dependent for pericare. Nustep level 4 x 5 min with B UE/LE for global endurance and reciprocal movement pattern. Pt becomes emotional during therapy session about recovery process for her stroke. Educated pt about how a stroke affects her body and that she very early on in the recovery process and that with therapy she should see improvements in her overall level of function. Pt left seated in w/c in room with needs in reach, quick release belt and chair alarm in place.  Therapy Documentation Precautions:  Precautions Precautions: Fall Restrictions Weight Bearing Restrictions: No  See Function Navigator for Current Functional Status.   Therapy/Group: Individual Therapy  Peter Congo, PT, DPT  07/17/2017, 3:53 PM

## 2017-07-17 NOTE — Progress Notes (Signed)
Speech Language Pathology Daily Session Note  Patient Details  Name: Kelly Romero MRN: [id number] Date of Birth: 01-Dec-1965  Today's Date: 07/17/2017 SLP Individual Time: 1300-1330 SLP Individual Time Calculation (min): 30 min  Short Term Goals: Week 1: SLP Short Term Goal 1 (Week 1): Pt will utilize word finding strategies to convey basic thoughts and ideas with Mod A cues.  SLP Short Term Goal 2 (Week 1): Pt will utilize speech intelligibility strategies with Mod A  to increase speech intelligibility to ~ 90% at the word level.  SLP Short Term Goal 3 (Week 1): Pt will solve semi-complex problem solving tasks with Mod A cues.  SLP Short Term Goal 4 (Week 1): Pt will demonstrate selective attention in moderately distracting environment for 30 minutes iwth Mod A cues.  SLP Short Term Goal 5 (Week 1): Pt will consume trials of dysphagia 2 with Mod A cues for use of compensatory swallow strategies to effectively clear oral residue.   Skilled Therapeutic Interventions: Skilled treatment session focused on dysphagia and communication goals. SLP facilitated session by providing skilled observation of pt consuming thin water via cup sips with Min A cues to prevent anterior spillage. Pt with increased oral holding/delayed swallow initiation and immediate hard coughing with 2 out of 5 trials. With cues for head turn to right, pt with decreased oral holding but immediate coughing. Recommend continuing nectar thick liquids until instrumental swallow study on 07/19/17. Pt required Mod A to Max A cues to use increased vocal intensity and over-articulation to increase speech intelligibility to ~ 75% at phrase level. Pt tearful and emotional support provided. Pt left upright in wheelchair, safety belt donned and all needs within reach.      Function:  Eating Eating   Modified Consistency Diet: No(Trials of water with SLP) Eating Assist Level: Supervision or verbal cues            Cognition Comprehension Comprehension assist level: Understands basic 25 - 49% of the time/ requires cueing 50 - 75% of the time  Expression   Expression assist level: Expresses basic 25 - 49% of the time/requires cueing 50 - 75% of the time. Uses single words/gestures.  Social Interaction Social Interaction assist level: Interacts appropriately 25 - 49% of time - Needs frequent redirection.  Problem Solving Problem solving assist level: Solves basic 25 - 49% of the time - needs direction more than half the time to initiate, plan or complete simple activities  Memory Memory assist level: Recognizes or recalls 50 - 74% of the time/requires cueing 25 - 49% of the time    Pain    Therapy/Group: Individual Therapy  Shahidah Nesbitt 07/17/2017, 1:48 PM

## 2017-07-17 NOTE — Progress Notes (Signed)
Subjective/Complaints: Patient seen lying in bed this morning. She states she slept well overnight. No reported issues overnight.  ROS-  Limited due to dysarthria and cognition  Objective: Vital Signs: Blood pressure (!) 158/71, pulse 87, temperature 98.8 F (37.1 C), temperature source Oral, resp. rate 16, height  (1.651 m), weight 98.2 kg (216 lb 7.9 oz), SpO2 93 %. No results found. Results for orders placed or performed during the hospital encounter of 07/13/17 (from the past 72 hour(s))  Glucose, capillary     Status: Abnormal   Collection Time: 07/14/17  4:40 PM  Result Value Ref Range   Glucose-Capillary 120 (H) 65 - 99 mg/dL  Glucose, capillary     Status: Abnormal   Collection Time: 07/14/17  9:04 PM  Result Value Ref Range   Glucose-Capillary 143 (H) 65 - 99 mg/dL  Glucose, capillary     Status: Abnormal   Collection Time: 07/15/17  6:09 AM  Result Value Ref Range   Glucose-Capillary 101 (H) 65 - 99 mg/dL  Glucose, capillary     Status: Abnormal   Collection Time: 07/15/17 11:36 AM  Result Value Ref Range   Glucose-Capillary 145 (H) 65 - 99 mg/dL  Glucose, capillary     Status: None   Collection Time: 07/15/17  5:02 PM  Result Value Ref Range   Glucose-Capillary 81 65 - 99 mg/dL  Glucose, capillary     Status: None   Collection Time: 07/15/17  9:05 PM  Result Value Ref Range   Glucose-Capillary 94 65 - 99 mg/dL  Glucose, capillary     Status: Abnormal   Collection Time: 07/16/17  6:27 AM  Result Value Ref Range   Glucose-Capillary 103 (H) 65 - 99 mg/dL  Glucose, capillary     Status: Abnormal   Collection Time: 07/16/17 12:04 PM  Result Value Ref Range   Glucose-Capillary 125 (H) 65 - 99 mg/dL  Glucose, capillary     Status: Abnormal   Collection Time: 07/16/17  4:48 PM  Result Value Ref Range   Glucose-Capillary 108 (H) 65 - 99 mg/dL  Glucose, capillary     Status: None   Collection Time: 07/16/17  9:20 PM  Result Value Ref Range   Glucose-Capillary  96 65 - 99 mg/dL  Glucose, capillary     Status: None   Collection Time: 07/17/17  6:37 AM  Result Value Ref Range   Glucose-Capillary 94 65 - 99 mg/dL   Comment 1 Notify RN   Glucose, capillary     Status: Abnormal   Collection Time: 07/17/17 11:47 AM  Result Value Ref Range   Glucose-Capillary 105 (H) 65 - 99 mg/dL   Comment 1 Notify RN      Constitutional: No distress . Vital signs reviewed. HENT: Normocephalic.  Atraumatic. Eyes: EOMI. No discharge. Cardiovascular: RRR. No JVD. Respiratory: CTA Bilaterally. Normal effort. GI: BS +. Non-distended. Musc: No edema or tenderness in extremities. Skin:   Intact. Warm and dry. Neuro:  Alert Right facial droop Alert/Oriented Motor: 4/5 RUE proximal to distal 4+/5 LUE proximal to distal RLE: 2-/5 HF, KE, ADF, 4/5  LLE: 2-/5 HF, KE, ADF Dysarthria Psych: Very flat.  Assessment/Plan: 1. 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 Physiatrist is providing close team supervision and 24 hour management of active medical problems listed below.  Physiatrist and rehab team continue to assess barriers to discharge/monitor patient progress toward functional and medical goals. FIM: Function - Bathing Position: Shower Body parts bathed by patient: Chest, Abdomen, Front perineal area, Right upper leg, Left upper leg Body parts bathed by helper: Left arm, Right lower leg, Left lower leg, Back, Buttocks, Right arm  Function- Upper Body Dressing/Undressing What is the patient wearing?: Pull over shirt/dress Pull over shirt/dress - Perfomed by patient: Thread/unthread left sleeve, Put head through opening, Thread/unthread right sleeve, Pull shirt over trunk Pull over shirt/dress - Perfomed by helper:  Thread/unthread right sleeve, Pull shirt over trunk Assist Level: Touching or steadying assistance(Pt > 75%) Function - Lower Body Dressing/Undressing What is the patient wearing?: Pants, Socks, Shoes Position: Wheelchair/chair at sink Pants- Performed by patient: Thread/unthread right pants leg, Thread/unthread left pants leg Pants- Performed by helper: Pull pants up/down Non-skid slipper socks- Performed by helper: Don/doff right sock, Don/doff left sock Socks - Performed by helper: Don/doff right sock, Don/doff left sock Shoes - Performed by helper: Don/doff right shoe, Don/doff left shoe, Fasten right, Fasten left Assist for footwear: Maximal assist Assist for lower body dressing: Touching or steadying assistance (Pt > 75%)  Function - Toileting Toileting steps completed by helper: Adjust clothing prior to toileting, Performs perineal hygiene, Adjust clothing after toileting Toileting Assistive Devices: Grab bar or rail Assist level: Touching or steadying assistance (Pt.75%)  Function - Archivist transfer assistive device: Grab bar Assist level to toilet: Moderate assist (Pt 50 - 74%/lift or lower) Assist level from toilet: Moderate assist (Pt 50 - 74%/lift or lower)  Function - Chair/bed transfer Chair/bed transfer method: Stand pivot, Ambulatory Chair/bed transfer assist level: Touching or steadying assistance (Pt > 75%) Chair/bed transfer assistive device: Armrests  Function - Locomotion: Wheelchair Will patient use wheelchair at discharge?: No Wheelchair activity did not occur: Safety/medical concerns Wheel 50 feet with 2 turns activity did not occur: Safety/medical concerns Wheel 150 feet activity did not occur: Safety/medical concerns Function - Locomotion: Ambulation Assistive device: Walker-rolling Max distance: 100 ft Assist level: Touching or steadying assistance (Pt > 75%) Assist level: Touching or steadying assistance (Pt > 75%) Assist level:  Touching or steadying assistance (Pt > 75%) Walk 150 feet activity did not occur: Safety/medical concerns Assist level: Moderate assist (Pt 50 - 74%) Assist level: Moderate assist (Pt 50 - 74%)  Function - Comprehension Comprehension: Auditory Comprehension assist level: Understands basic 25 - 49% of the time/ requires cueing 50 - 75% of the time  Function - Expression Expression: Verbal Expression assist level: Expresses basic 50 - 74% of the time/requires cueing 25 - 49% of the time. Needs to repeat parts of sentences.  Function - Social Interaction Social Interaction assist level: Interacts appropriately 25 - 49% of time - Needs frequent redirection.  Function - Problem Solving Problem solving assist level: Solves basic 25 - 49% of the time - needs direction more than half the time to initiate, plan or complete simple activities  Function - Memory Memory assist level: Recognizes or recalls 50 - 74% of the time/requires cueing 25 - 49% of the time Patient normally able to recall (first 3 days only): Current season, That he or she is in a hospital, Location of own room   Medical Problem List and Plan:  1. Right hemiparesis and functional deficits secondary to left periventricular white matter and right frontal lobe infarcts    Cont CIR  2. DVT Prophylaxis/Anticoagulation: Pharmaceutical: Lovenox  q 24h 3. Chronic HA/Pain  Management: Depakote bid with tylenol prn.  Lower extremity paresthesias  Trial Cymbalta 4. Mood: LCSW to follow for evaluation and support.    Emotional lability trial of Cymbalta.    If this is not helpful may consider SSRI 5. Neuropsych: This patient is not fully capable of making decisions on her own behalf.  6. Skin/Wound Care: routine pressure relief measures.  7. Fluids/Electrolytes/Nutrition: Monitor I/Os  8. 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 Monitor BS  ac/hs. Educate on importance of compliance.  CBG (last 3)  Recent Labs    07/16/17 2120 07/17/17 0637 07/17/17 1147  GLUCAP 96 94 105*   Controlled on 5/4 9. Dyslipidemia: On Crestor.  10. Anemia: Hgb stable at 9.5 on 5/1 11. HTN with orthostasis  Monitor BP bid. Resumed lisinopril at lower dose and titrate daily. Vitals:   07/16/17 1521 07/17/17 0513  BP: (!) 151/70 (!) 158/71  Pulse: 87 87  Resp: 18 16  Temp: 98.8 F (37.1 C) 98.8 F (37.1 C)  SpO2: 99% 93%   Ordered TEDs  Abdominal binder if necessary 12. Hypoalbuminemia  Supplement initiated on 5/4  LOS (Days) 4 A FACE TO FACE EVALUATION WAS PERFORMED  Seth Friedlander Karis Juba 07/17/2017, 12:00 PM

## 2017-07-17 NOTE — Plan of Care (Signed)
  Problem: RH SAFETY Goal: RH STG ADHERE TO SAFETY PRECAUTIONS W/ASSISTANCE/DEVICE Description: STG Adhere to Safety Precautions With mod Assistance/Device. Outcome: Progressing   

## 2017-07-17 NOTE — Progress Notes (Signed)
Occupational Therapy Session Note  Patient Details  Name: Kelly Romero MRN: [id number] Date of Birth: 1965/07/02  Today's Date: 07/17/2017 OT Individual Time: 5284-1324 OT Individual Time Calculation (min): 59 min   Short Term Goals: Week 1:  OT Short Term Goal 1 (Week 1): Pt will maintain dynamic standing balance during toileting task with CGA OT Short Term Goal 2 (Week 1): Pt will complete LB dressing with steadying assist only OT Short Term Goal 3 (Week 1): Pt will use R UE during bathing task with min cuing for awareness OT Short Term Goal 4 (Week 1): Pt will consistently be oriented x4 with min questioning cues  Skilled Therapeutic Interventions/Progress Updates:    Pt greeted EOB with RN staff setting up her breakfast. Tx focus on Rt NMR, attention, and motor planning during self feeding and dressing tasks. Pt using R UE at dominant level with utensils, bringing food completely to mouth, with encouragement and extra time. OT provided gentle guidance at scapula to facilitate normal scapulothoracic rhythm. Removed her plate from built up tray bottom to lessen reaching demands. Also modified environment to minimize distractions. Mod cues for implementing swallowing strategies. After meal, she opted to get dressed. Pt ambulated with HHA on Rt to dresser, selected clothing items with Rt and handed them to therapist without assist from Lt UE! ADL retraining w/c level sit<stand at sink, with min instruction on hemi techniques. Mod cues for attending to task due to being distracted by items on sink. Pt able to don her Rt shoe and tie laces today! Increased assist required for elevating pants over hips due to dizziness post bending forward for footwear. Dizziness reportedly absolved with seated rest and drinking nectar thick water. She was left with all needs, chair alarm set, and safety belt fastened at session exit.    Therapy Documentation Precautions:  Precautions Precautions:  Fall Restrictions Weight Bearing Restrictions: No Pain: No c/o pain during session    ADL:     See Function Navigator for Current Functional Status.   Therapy/Group: Individual Therapy  Hutson Luft A Zacariah Belue 07/17/2017, 12:30 PM

## 2017-07-17 NOTE — Progress Notes (Addendum)
Physical Therapy Session Note  Patient Details  Name: Kelly Romero MRN: [id number] Date of Birth: 03-14-1966  Today's Date: 07/17/2017 PT Individual Time: 1100-1200 PT Individual Time Calculation (min): 60 min   Short Term Goals: Week 1:  PT Short Term Goal 1 (Week 1): Pt will ambulate 75 ft with LRAD & supervision. PT Short Term Goal 2 (Week 1): Pt will negotiate 12 steps with B rails & mod assist for strengthening purposes.  Skilled Therapeutic Interventions/Progress Updates:    Pt seated in w/c in room, agreeable to PT. No complaints of pain. Sit to stand with min assist, ambulation x 100 ft with min assist for balance with no AD. Pt exhibits decreased RLE clearance with gait. Trial amb x 10 ft with focus on heel strike with RLE and no AD, pt exhibits poor ability to follow verbal and visual cues for heel strike with gait. Trial amb in // bars with focus on heel strike with RLE, improved balance and carryover of instructions in // bars. Focus on RLE clearance with gait in // bars: agility ladder and object step-overs with focus on clearing RLE, pt demos fair ability to clear obstacles with RLE and shows no awareness of RLE catching on objects. Standing alt L/R 4" step taps with focus on clearing RLE and not dragging it off of step with BUE support in // bars and min assist for balance. Ambulation with focus on scanning environment for objects in L and R visual field, min assist for balance and min verbal cues to find objects. Pt left seated in w/c in room with needs in reach, chair alarm and quick-release belt in place.  Therapy Documentation Precautions:  Precautions Precautions: Fall Restrictions Weight Bearing Restrictions: No  See Function Navigator for Current Functional Status.   Therapy/Group: Individual Therapy  Peter Congo, PT, DPT  07/17/2017, 12:22 PM

## 2017-07-18 ENCOUNTER — Inpatient Hospital Stay (HOSPITAL_COMMUNITY): Payer: Medicaid Other | Admitting: Occupational Therapy

## 2017-07-18 LAB — GLUCOSE, CAPILLARY
GLUCOSE-CAPILLARY: 89 mg/dL (ref 65–99)
GLUCOSE-CAPILLARY: 140 mg/dL — AB (ref 65–99)
GLUCOSE-CAPILLARY: 116 mg/dL — AB (ref 65–99)
Glucose-Capillary: 132 mg/dL — ABNORMAL HIGH (ref 65–99)

## 2017-07-18 NOTE — Progress Notes (Signed)
Subjective/Complaints: Patient seen lying in bed this morning. She states she slept well overnight. She is very flat and interacts limitedly.  ROS-  Limited due to dysarthria and cognition, but appears to deny CP, SOB, vomiting, diarrhea.  Objective: Vital Signs: Blood pressure (!) 168/67, pulse 84, temperature 98.8 F (37.1 C), temperature source Oral, resp. rate 16, height  (1.651 m), weight 96.1 kg (211 lb 13.8 oz), SpO2 94 %. No results found. Results for orders placed or performed during the hospital encounter of 07/13/17 (from the past 72 hour(s))  Glucose, capillary     Status: None   Collection Time: 07/15/17  5:02 PM  Result Value Ref Range   Glucose-Capillary 81 65 - 99 mg/dL  Glucose, capillary     Status: None   Collection Time: 07/15/17  9:05 PM  Result Value Ref Range   Glucose-Capillary 94 65 - 99 mg/dL  Glucose, capillary     Status: Abnormal   Collection Time: 07/16/17  6:27 AM  Result Value Ref Range   Glucose-Capillary 103 (H) 65 - 99 mg/dL  Glucose, capillary     Status: Abnormal   Collection Time: 07/16/17 12:04 PM  Result Value Ref Range   Glucose-Capillary 125 (H) 65 - 99 mg/dL  Glucose, capillary     Status: Abnormal   Collection Time: 07/16/17  4:48 PM  Result Value Ref Range   Glucose-Capillary 108 (H) 65 - 99 mg/dL  Glucose, capillary     Status: None   Collection Time: 07/16/17  9:20 PM  Result Value Ref Range   Glucose-Capillary 96 65 - 99 mg/dL  Glucose, capillary     Status: None   Collection Time: 07/17/17  6:37 AM  Result Value Ref Range   Glucose-Capillary 94 65 - 99 mg/dL   Comment 1 Notify RN   Glucose, capillary     Status: Abnormal   Collection Time: 07/17/17 11:47 AM  Result Value Ref Range   Glucose-Capillary 105 (H) 65 - 99 mg/dL   Comment 1 Notify RN   Glucose, capillary     Status: None   Collection Time: 07/17/17  5:21 PM  Result Value Ref Range   Glucose-Capillary 81 65 - 99 mg/dL   Comment 1 Notify RN   Glucose,  capillary     Status: None   Collection Time: 07/17/17  9:08 PM  Result Value Ref Range   Glucose-Capillary 85 65 - 99 mg/dL  Glucose, capillary     Status: None   Collection Time: 07/18/17  6:40 AM  Result Value Ref Range   Glucose-Capillary 89 65 - 99 mg/dL   Comment 1 Notify RN   Glucose, capillary     Status: Abnormal   Collection Time: 07/18/17 12:09 PM  Result Value Ref Range   Glucose-Capillary 132 (H) 65 - 99 mg/dL     Constitutional: No distress . Vital signs reviewed. HENT: Normocephalic.  Atraumatic. Eyes: EOMI. No discharge. Cardiovascular: RRR. No JVD. Respiratory: CTA Bilaterally. Normal effort. GI: BS +. Non-distended. Musc: No edema or tenderness in extremities. Skin:   Intact. Warm and dry. Neuro:  Alert Right facial droop Alert/Oriented Motor: 4/5 RUE proximal to distal 4+/5 LUE proximal to distal RLE: 2-/5 HF, KE, ADF, 4/5 (stable) LLE: 2-/5 HF, KE, ADF Dysarthria Psych: Very flat.  Assessment/Plan: 1. 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 Physiatrist is providing close team supervision and 24 hour management of active medical problems listed below. Physiatrist and rehab team continue to assess barriers to discharge/monitor patient progress toward functional and medical goals. FIM: Function - Bathing Bathing activity did not occur: Refused Position: Shower Body parts bathed by patient: Chest, Abdomen, Front perineal area, Right upper leg, Left upper leg Body parts bathed by helper: Left arm, Right lower leg, Left lower leg, Back, Buttocks, Right arm  Function- Upper Body Dressing/Undressing What is the patient wearing?: Pull over shirt/dress Pull over shirt/dress - Perfomed by patient: Thread/unthread left  sleeve, Put head through opening, Thread/unthread right sleeve, Pull shirt over trunk Pull over shirt/dress - Perfomed by helper: Thread/unthread right sleeve, Pull shirt over trunk Assist Level: Supervision or verbal cues Function - Lower Body Dressing/Undressing What is the patient wearing?: Pants, Shoes, Ted Hose Position: Wheelchair/chair at [employer]- Performed by patient: Thread/unthread left pants leg Pants- Performed by helper: Thread/unthread right pants leg, Pull pants up/down Non-skid slipper socks- Performed by helper: Don/doff right sock, Don/doff left sock Socks - Performed by helper: Don/doff right sock, Don/doff left sock Shoes - Performed by patient: Don/doff right shoe, Fasten right Shoes - Performed by helper: Don/doff left shoe, Fasten left TED Hose - Performed by helper: Don/doff right TED hose, Don/doff left TED hose Assist for footwear: Partial/moderate assist Assist for lower body dressing: Touching or steadying assistance (Pt > 75%)  Function - Toileting Toileting steps completed by helper: Adjust clothing prior to toileting, Performs perineal hygiene, Adjust clothing after toileting Toileting Assistive Devices: Grab bar or rail Assist level: Touching or steadying assistance (Pt.75%)  Function - Archivist transfer assistive device: Grab bar Assist level to toilet: Moderate assist (Pt 50 - 74%/lift or lower) Assist level from toilet: Moderate assist (Pt 50 - 74%/lift or lower)  Function - Chair/bed transfer Chair/bed transfer method: Ambulatory Chair/bed transfer assist level: Touching or steadying assistance (Pt > 75%) Chair/bed transfer assistive device: Bedrails, Armrests  Function - Locomotion: Wheelchair Will patient use wheelchair at discharge?: No Wheelchair activity did not occur: Safety/medical concerns Wheel 50 feet with 2 turns activity did not occur: Safety/medical concerns Wheel 150 feet activity did not occur: Safety/medical  concerns Function - Locomotion: Ambulation Assistive device: No device Max distance: 100' Assist level: Touching or steadying assistance (Pt > 75%) Assist level: Touching or steadying assistance (Pt > 75%) Assist level: Touching or steadying assistance (Pt > 75%) Walk 150 feet activity did not occur: Safety/medical concerns Assist level: Moderate assist (Pt 50 - 74%) Assist level: Moderate assist (Pt 50 - 74%)  Function - Comprehension Comprehension: Auditory Comprehension assist level: Understands basic 75 - 89% of the time/ requires cueing 10 - 24% of the time  Function - Expression Expression: Verbal Expression assist level: Expresses basic 25 - 49% of the time/requires cueing 50 - 75% of the time. Uses single words/gestures.  Function - Social Interaction Social Interaction assist level: Interacts appropriately 25 - 49% of time - Needs frequent redirection.  Function - Problem Solving Problem solving assist level: Solves basic 25 - 49% of the time - needs direction more than half the time to initiate, plan or complete simple activities  Function - Memory Memory assist level: Recognizes or recalls 25 - 49% of the time/requires cueing 50 - 75% of the time Patient normally able to recall (first 3 days only): Current season, That he or she is in a hospital, Location of own room  Medical Problem List and Plan:  1. Right hemiparesis and functional deficits secondary to left periventricular white matter and right frontal lobe infarcts    Cont CIR  2. DVT Prophylaxis/Anticoagulation: Pharmaceutical: Lovenox  q 24h 3. Chronic HA/Pain Management: Depakote bid with tylenol prn.  Lower extremity paresthesias  Trial Cymbalta 4. Mood: LCSW to follow for evaluation and support.    Emotional lability trial of Cymbalta.    If this is not helpful may consider SSRI 5. Neuropsych: This patient is not fully capable of making decisions on her own behalf.  6. Skin/Wound Care: routine  pressure relief measures.  7. Fluids/Electrolytes/Nutrition: Monitor I/Os  8. 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 Monitor BS ac/hs. Educate on importance of compliance.  CBG (last 3)  Recent Labs    07/17/17 2108 07/18/17 0640 07/18/17 1209  GLUCAP 85 89 132*   Relatively controlled on 5/5 9. Dyslipidemia: On Crestor.  10. Anemia: Hgb stable at 9.5 on 5/1 11. HTN with orthostasis  Monitor BP bid. Resumed lisinopril at lower dose and titrate daily. Vitals:   07/17/17 1419 07/18/17 0221  BP: (!) 157/64 (!) 168/67  Pulse: 82 84  Resp: 20 16  Temp: 98.4 F (36.9 C) 98.8 F (37.1 C)  SpO2: 99% 94%   Ordered TEDs  Abdominal binder if necessary   Continue to monitor 12. Hypoalbuminemia  Supplement initiated on 5/4  LOS (Days) 5 A FACE TO FACE EVALUATION WAS PERFORMED  Ankit Karis Juba 07/18/2017, 4:03 PM

## 2017-07-18 NOTE — Progress Notes (Signed)
Occupational Therapy Session Note  Patient Details  Name: Kelly Romero MRN: [id number] Date of Birth: 1965/05/18  Today's Date: 07/18/2017 OT Group Time: 1300-1400 OT Group Time Calculation (min): 60 min  Skilled Therapeutic Interventions/Progress Updates:    Pt participated in therapeutic dance group with focus on UE/LE strengthening and ROM, Rt NMR, activity tolerance, and social participation. Pt was guided through dance exercises including forward and overhead claps, straight arm raises, seated marches, ankle pumps, and leg lifts. Increased social demands with pt clapping both hands with neighbors and when holding hands and swaying to music for >3 minute windows. She fatigued quickly and required several rest breaks, however actively participated with min vcs. Pt attempting to sing along to familiar gospel songs throughout. At end of session she started crying and reported "I'm so grateful for ya'll." The group members provided support collectively which appeared to enhance psychosocial wellness. She was escorted back to room by OT and left with all needs within reach and safety belt fastened.   Therapy Documentation Precautions:  Precautions Precautions: Fall Restrictions Weight Bearing Restrictions: No Pain: Pain Assessment Pain Scale: 0-10 Pain Score: 0-No pain ADL:    See Function Navigator for Current Functional Status.   Therapy/Group: Group Therapy  [name] A [name] 07/18/2017, 3:42 PM

## 2017-07-19 ENCOUNTER — Ambulatory Visit (HOSPITAL_COMMUNITY): Payer: Medicaid Other | Admitting: Speech Pathology

## 2017-07-19 ENCOUNTER — Inpatient Hospital Stay (HOSPITAL_COMMUNITY): Payer: Medicaid Other

## 2017-07-19 ENCOUNTER — Inpatient Hospital Stay (HOSPITAL_COMMUNITY): Payer: Medicaid Other | Admitting: Occupational Therapy

## 2017-07-19 ENCOUNTER — Inpatient Hospital Stay (HOSPITAL_COMMUNITY): Payer: Medicaid Other | Admitting: Physical Therapy

## 2017-07-19 ENCOUNTER — Encounter (HOSPITAL_COMMUNITY): Payer: Medicaid Other | Admitting: Speech Pathology

## 2017-07-19 DIAGNOSIS — I69398 Other sequelae of cerebral infarction: Secondary | ICD-10-CM

## 2017-07-19 DIAGNOSIS — I6300 Cerebral infarction due to thrombosis of unspecified precerebral artery: Secondary | ICD-10-CM

## 2017-07-19 DIAGNOSIS — G40909 Epilepsy, unspecified, not intractable, without status epilepticus: Secondary | ICD-10-CM

## 2017-07-19 DIAGNOSIS — R569 Unspecified convulsions: Secondary | ICD-10-CM

## 2017-07-19 LAB — COMPREHENSIVE METABOLIC PANEL
ALBUMIN: 2.8 g/dL — AB (ref 3.5–5.0)
ALT: 12 U/L — ABNORMAL LOW (ref 14–54)
AST: 18 U/L (ref 15–41)
Alkaline Phosphatase: 60 U/L (ref 38–126)
Anion gap: 8 (ref 5–15)
BUN: 15 mg/dL (ref 6–20)
CHLORIDE: 102 mmol/L (ref 101–111)
CO2: 30 mmol/L (ref 22–32)
CREATININE: 1.14 mg/dL — AB (ref 0.44–1.00)
Calcium: 9.3 mg/dL (ref 8.9–10.3)
GFR calc Af Amer: >60 mL/min (ref >60)
GFR, EST NON AFRICAN AMERICAN: 54 mL/min — AB (ref >60)
GLUCOSE: 155 mg/dL — AB (ref 65–99)
Potassium: 3.7 mmol/L (ref 3.5–5.1)
SODIUM: 140 mmol/L (ref 135–145)
Total Bilirubin: 0.4 mg/dL (ref 0.3–1.2)
Total Protein: 6.1 g/dL — ABNORMAL LOW (ref 6.5–8.1)

## 2017-07-19 LAB — CBC WITH DIFFERENTIAL/PLATELET
Basophils Absolute: 0 10*3/uL (ref 0.0–0.1)
Basophils Relative: 0 %
EOS ABS: 0.1 10*3/uL (ref 0.0–0.7)
EOS PCT: 1 %
HCT: 32.8 % — ABNORMAL LOW (ref 36.0–46.0)
Hemoglobin: 10.2 g/dL — ABNORMAL LOW (ref 12.0–15.0)
LYMPHS ABS: 1 10*3/uL (ref 0.7–4.0)
LYMPHS PCT: 21 %
MCH: 28.1 pg (ref 26.0–34.0)
MCHC: 31.1 g/dL (ref 30.0–36.0)
MCV: 90.4 fL (ref 78.0–100.0)
MONOS PCT: 7 %
Monocytes Absolute: 0.3 10*3/uL (ref 0.1–1.0)
Neutro Abs: 3.3 10*3/uL (ref 1.7–7.7)
Neutrophils Relative %: 71 %
PLATELETS: 252 10*3/uL (ref 150–400)
RBC: 3.63 MIL/uL — AB (ref 3.87–5.11)
RDW: 13.3 % (ref 11.5–15.5)
WBC: 4.7 10*3/uL (ref 4.0–10.5)

## 2017-07-19 LAB — GLUCOSE, CAPILLARY
GLUCOSE-CAPILLARY: 132 mg/dL — AB (ref 65–99)
GLUCOSE-CAPILLARY: 150 mg/dL — AB (ref 65–99)
Glucose-Capillary: 96 mg/dL (ref 65–99)
Glucose-Capillary: 102 mg/dL — ABNORMAL HIGH (ref 65–99)
Glucose-Capillary: 120 mg/dL — ABNORMAL HIGH (ref 65–99)

## 2017-07-19 LAB — VALPROIC ACID LEVEL: Valproic Acid Lvl: 53 ug/mL (ref 50.0–100.0)

## 2017-07-19 MED ORDER — LISINOPRIL 10 MG PO TABS
10.0000 mg | ORAL_TABLET | Freq: Every day | ORAL | Status: DC
  Administered 2017-07-19: 10 mg via ORAL
  Filled 2017-07-19: qty 1

## 2017-07-19 MED ORDER — VALPROATE SODIUM 250 MG/5ML PO SOLN
750.0000 mg | Freq: Two times a day (BID) | ORAL | Status: DC
  Administered 2017-07-19 – 2017-07-20 (×2): 750 mg via ORAL
  Filled 2017-07-19 (×3): qty 15

## 2017-07-19 NOTE — Progress Notes (Signed)
Subjective/Complaints: Pt with episode of staring off, non responsive, freezing while walking, seen by another MD at that time who described increased extension of trunk and limbs No shaking was drooling but per OT was drooling since breakfast  BP elevated  Denies HA, no other pain except abdominal  ROS-  Limited due to dysarthria and cognition, but appears to deny CP, SOB, vomiting, diarrhea.  Objective: Vital Signs: Blood pressure (!) 171/98, pulse (!) 108, temperature 97.9 F (36.6 C), temperature source Oral, resp. rate 16, height  (1.651 m), weight 96.1 kg (211 lb 13.8 oz), SpO2 97 %. No results found. Results for orders placed or performed during the hospital encounter of 07/13/17 (from the past 72 hour(s))  Glucose, capillary     Status: Abnormal   Collection Time: 07/16/17 12:04 PM  Result Value Ref Range   Glucose-Capillary 125 (H) 65 - 99 mg/dL  Glucose, capillary     Status: Abnormal   Collection Time: 07/16/17  4:48 PM  Result Value Ref Range   Glucose-Capillary 108 (H) 65 - 99 mg/dL  Glucose, capillary     Status: None   Collection Time: 07/16/17  9:20 PM  Result Value Ref Range   Glucose-Capillary 96 65 - 99 mg/dL  Glucose, capillary     Status: None   Collection Time: 07/17/17  6:37 AM  Result Value Ref Range   Glucose-Capillary 94 65 - 99 mg/dL   Comment 1 Notify RN   Glucose, capillary     Status: Abnormal   Collection Time: 07/17/17 11:47 AM  Result Value Ref Range   Glucose-Capillary 105 (H) 65 - 99 mg/dL   Comment 1 Notify RN   Glucose, capillary     Status: None   Collection Time: 07/17/17  5:21 PM  Result Value Ref Range   Glucose-Capillary 81 65 - 99 mg/dL   Comment 1 Notify RN   Glucose, capillary     Status: None   Collection Time: 07/17/17  9:08 PM  Result Value Ref Range   Glucose-Capillary 85 65 - 99 mg/dL  Glucose, capillary     Status: None   Collection Time: 07/18/17  6:40 AM  Result Value Ref Range   Glucose-Capillary 89 65 - 99  mg/dL   Comment 1 Notify RN   Glucose, capillary     Status: Abnormal   Collection Time: 07/18/17 12:09 PM  Result Value Ref Range   Glucose-Capillary 132 (H) 65 - 99 mg/dL  Glucose, capillary     Status: Abnormal   Collection Time: 07/18/17  4:31 PM  Result Value Ref Range   Glucose-Capillary 140 (H) 65 - 99 mg/dL  Glucose, capillary     Status: Abnormal   Collection Time: 07/18/17  8:58 PM  Result Value Ref Range   Glucose-Capillary 116 (H) 65 - 99 mg/dL   Comment 1 Notify RN   Glucose, capillary     Status: None   Collection Time: 07/19/17  6:38 AM  Result Value Ref Range   Glucose-Capillary 96 65 - 99 mg/dL   Comment 1 Notify RN      Constitutional: No distress . Vital signs reviewed. HENT: Normocephalic.  Atraumatic. Eyes: EOMI. No discharge. Cardiovascular: RRR. No JVD. Respiratory: CTA Bilaterally. Normal effort. GI: BS +. Non-distended.Mild lower abd tenderness Musc: No edema or tenderness in extremities. Skin:   Intact. Warm and dry. Neuro:  Alert Right facial droop Alert/Oriented to hsop but [place], oriented to person not time Motor: 4/5 RUE proximal to distal-  stable 4+/5 LUE proximal to distal- stable RLE: 3-/5 HF, KE, ADF, 4/5 (stable) LLE: 3-/5 HF, KE, ADF stable Dysarthria Psych: Very flat.  Assessment/Plan: 1. 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 Physiatrist is providing close team supervision and 24 hour management of active medical problems listed below. Physiatrist and rehab team continue to assess barriers to discharge/monitor patient progress toward functional and medical goals. FIM: Function - Bathing Bathing activity did not occur: Refused Position: Shower Body parts bathed by patient: Chest, Abdomen,  Front perineal area, Right upper leg, Left upper leg Body parts bathed by helper: Left arm, Right lower leg, Left lower leg, Back, Buttocks, Right arm  Function- Upper Body Dressing/Undressing What is the patient wearing?: Pull over shirt/dress Pull over shirt/dress - Perfomed by patient: Thread/unthread left sleeve, Put head through opening, Thread/unthread right sleeve, Pull shirt over trunk Pull over shirt/dress - Perfomed by helper: Thread/unthread right sleeve, Pull shirt over trunk Assist Level: Supervision or verbal cues Function - Lower Body Dressing/Undressing What is the patient wearing?: Pants, Shoes, Ted Hose Position: Wheelchair/chair at [employer]- Performed by patient: Thread/unthread left pants leg Pants- Performed by helper: Thread/unthread right pants leg, Pull pants up/down Non-skid slipper socks- Performed by helper: Don/doff right sock, Don/doff left sock Socks - Performed by helper: Don/doff right sock, Don/doff left sock Shoes - Performed by patient: Don/doff right shoe, Fasten right Shoes - Performed by helper: Don/doff left shoe, Fasten left TED Hose - Performed by helper: Don/doff right TED hose, Don/doff left TED hose Assist for footwear: Partial/moderate assist Assist for lower body dressing: Touching or steadying assistance (Pt > 75%)  Function - Toileting Toileting steps completed by helper: Adjust clothing prior to toileting, Performs perineal hygiene, Adjust clothing after toileting Toileting Assistive Devices: Grab bar or rail Assist level: Touching or steadying assistance (Pt.75%)  Function - Archivist transfer assistive device: Grab bar Assist level to toilet: Moderate assist (Pt 50 - 74%/lift or lower) Assist level from toilet: Moderate assist (Pt 50 - 74%/lift or lower)  Function - Chair/bed transfer Chair/bed transfer method: Ambulatory Chair/bed transfer assist level: Touching or steadying assistance (Pt > 75%) Chair/bed transfer  assistive device: Bedrails, Armrests  Function - Locomotion: Wheelchair Will patient use wheelchair at discharge?: No Wheelchair activity did not occur: Safety/medical concerns Wheel 50 feet with 2 turns activity did not occur: Safety/medical concerns Wheel 150 feet activity did not occur: Safety/medical concerns Function - Locomotion: Ambulation Assistive device: No device Max distance: 100' Assist level: Touching or steadying assistance (Pt > 75%) Assist level: Touching or steadying assistance (Pt > 75%) Assist level: Touching or steadying assistance (Pt > 75%) Walk 150 feet activity did not occur: Safety/medical concerns Assist level: Moderate assist (Pt 50 - 74%) Assist level: Moderate assist (Pt 50 - 74%)  Function - Comprehension Comprehension: Auditory Comprehension assist level: Understands basic 75 - 89% of the time/ requires cueing 10 - 24% of the time  Function - Expression Expression: Verbal Expression assist level: Expresses basic 25 - 49% of the time/requires cueing 50 - 75% of the time. Uses single words/gestures.  Function - Social Interaction Social Interaction assist level: Interacts appropriately 25 - 49% of time - Needs frequent redirection.  Function - Problem Solving Problem solving assist level: Solves basic 25 - 49% of the time - needs  direction more than half the time to initiate, plan or complete simple activities  Function - Memory Memory assist level: Recognizes or recalls 25 - 49% of the time/requires cueing 50 - 75% of the time Patient normally able to recall (first 3 days only): Current season, That he or she is in a hospital, Location of own room   Medical Problem List and Plan:  1. Right hemiparesis and functional deficits secondary to left periventricular white matter and right frontal lobe infarcts    Cont CIR - bedside therapy today   2. DVT Prophylaxis/Anticoagulation: Pharmaceutical: Lovenox  q 24h 3. Chronic HA/Pain Management:  Depakote bid with tylenol prn.  Lower extremity paresthesias  Trial Cymbalta 4. Mood: LCSW to follow for evaluation and support.    Emotional lability trial of Cymbalta.    If this is not helpful may consider SSRI 5. Neuropsych: This patient is not fully capable of making decisions on her own behalf.  6. Skin/Wound Care: routine pressure relief measures.  7. Fluids/Electrolytes/Nutrition: Monitor I/Os  8. 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 Monitor BS ac/hs. Educate on importance of compliance.  CBG (last 3)  Recent Labs    07/18/17 1631 07/18/17 2058 07/19/17 0638  GLUCAP 140* 116* 96   Relatively controlled on 5/6 9. Dyslipidemia: On Crestor.  10. Anemia: Hgb stable at 9.5 on 5/1 11. HTN with orthostasis  Monitor BP bid. Resumed lisinopril at lower dose and titrate daily. Vitals:   07/19/17 0134 07/19/17 0753  BP: (!) 184/79 (!) 171/98  Pulse: 83 (!) 108  Resp: 16   Temp: 97.9 F (36.6 C)   SpO2: 95% 97%   Ordered TEDs  Abdominal binder if necessary   Continue to monitor 12. Hypoalbuminemia  Supplement initiated on 5/4 13.  AMS probable seizure, check CT to R/o hemmorhagic transformation, check EEG On Depakene, reviewed Neuro outpt note, possible seizure in 2017 , has also been treated for migraines,check valproate level stat may need increased dose of depakene LOS (Days) 6 A FACE TO FACE EVALUATION WAS PERFORMED  Erick Colace 07/19/2017, 8:06 AM

## 2017-07-19 NOTE — Progress Notes (Signed)
Physical Therapy Session Note  Patient Details  Name: Kelly Romero MRN: [id number] Date of Birth: 1966-03-08  Today's Date: 07/19/2017   Short Term Goals: Week 1:  PT Short Term Goal 1 (Week 1): Pt will ambulate 75 ft with LRAD & supervision. PT Short Term Goal 2 (Week 1): Pt will negotiate 12 steps with B rails & mod assist for strengthening purposes.  Skilled Therapeutic Interventions/Progress Updates:  Attempted to see pt for scheduled PT session but tech arriving to perform EEG. Pt missed 60 minutes scheduled PT session. Will f/u per POC.  Therapy Documentation Precautions:  Precautions Precautions: Fall Restrictions Weight Bearing Restrictions: No   General: PT Amount of Missed Time (min): 60 Minutes PT Missed Treatment Reason: Other (Comment)(EEG)   See Function Navigator for Current Functional Status.   Therapy/Group: Individual Therapy  Sandi Mariscal 07/19/2017, 10:13 AM

## 2017-07-19 NOTE — Progress Notes (Signed)
EEG Completed; Results Pending  

## 2017-07-19 NOTE — Progress Notes (Signed)
Patient BP 187/77, on-call notified and Lisinopril 10 mg daily at bedtime was order. We continue to monitor

## 2017-07-19 NOTE — Progress Notes (Signed)
Speech Language Pathology Daily Session Note  Patient Details  Name: Kelly Romero MRN: [id number] Date of Birth: Dec 05, 1965  Today's Date: 07/19/2017 SLP Individual Time: 0930-1000 SLP Individual Time Calculation (min): 30 min  Short Term Goals: Week 1: SLP Short Term Goal 1 (Week 1): Pt will utilize word finding strategies to convey basic thoughts and ideas with Mod A cues.  SLP Short Term Goal 2 (Week 1): Pt will utilize speech intelligibility strategies with Mod A  to increase speech intelligibility to ~ 90% at the word level.  SLP Short Term Goal 3 (Week 1): Pt will solve semi-complex problem solving tasks with Mod A cues.  SLP Short Term Goal 4 (Week 1): Pt will demonstrate selective attention in moderately distracting environment for 30 minutes iwth Mod A cues.  SLP Short Term Goal 5 (Week 1): Pt will consume trials of dysphagia 2 with Mod A cues for use of compensatory swallow strategies to effectively clear oral residue.   Skilled Therapeutic Interventions: Skilled treatment session focused on communication goals. SLP received new order for bedside therapies only d/t possible seizure prior to session. SLP canceled MBS per request of MD and have rescheduled for 0900 on 07/19/17. SLP received pt in bed with appearance of fatigue. Pt required Max A cues to for minimal increase in vocal intensity to achieve ~ 25% speech intelligibility at the 1 to 2 word phrase level. Pt emotional and support given. Pt was left in bed, bed alarm set with all needs within reach. Continue per current plan of care.       Function:  Eating Eating                 Cognition Comprehension Comprehension assist level: Understands basic 75 - 89% of the time/ requires cueing 10 - 24% of the time  Expression   Expression assist level: Expresses basic 25 - 49% of the time/requires cueing 50 - 75% of the time. Uses single words/gestures.;Expresses basis less than 25% of the time/requires cueing >75% of the  time.  Social Interaction Social Interaction assist level: Interacts appropriately 25 - 49% of time - Needs frequent redirection.;Interacts appropriately less than 25% of the time. May be withdrawn or combative.  Problem Solving Problem solving assist level: Solves basic 25 - 49% of the time - needs direction more than half the time to initiate, plan or complete simple activities;Solves basic less than 25% of the time - needs direction nearly all the time or does not effectively solve problems and may need a restraint for safety  Memory Memory assist level: Recognizes or recalls 25 - 49% of the time/requires cueing 50 - 75% of the time    Pain Pain Assessment Pain Score: 0-No pain  Therapy/Group: Individual Therapy  Kelly Romero 07/19/2017, 10:47 AM

## 2017-07-19 NOTE — Procedures (Signed)
ELECTROENCEPHALOGRAM REPORT  Date of Study: 07/19/2017  Patient's Name: Kelly Romero MRN: [id number] Date of Birth: 12/03/1965  Referring Provider: Dr. Claudette Laws  Clinical History: This is a 52 year old woman with an episode of staring off, unresponsive, freezing while walking with extension of trunk and limbs.  Medications: Depakote  Technical Summary: A multichannel digital EEG recording measured by the international 10-20 system with electrodes applied with paste and impedances below 5000 ohms performed as portable with EKG monitoring in an awake and asleep patient.  Hyperventilation and photic stimulation were not performed.  The digital EEG was referentially recorded, reformatted, and digitally filtered in a variety of bipolar and referential montages for optimal display.   Description: The patient is awake and asleep during the recording.  During maximal wakefulness, there is a symmetric, medium voltage 7 Hz posterior dominant rhythm that attenuates with eye opening. This is admixed with a small amount of diffuse 4-5 Hz theta slowing of the waking background.  During drowsiness and sleep, there is an increase in theta and delta slowing of the background, with bursts of generalized high voltage delta slowing. There was additional occasional focal delta slowing over the left hemisphere. During sleep, poorly formed vertex waves were seen. Hyperventilation and photic stimulation were not performed.  There were no epileptiform discharges or electrographic seizures seen.    EKG lead was unremarkable.  Impression: This awake and asleep EEG is abnormal due to the presence of: 1. Mild to moderate diffuse background slowing 2. Occasional focal slowing over the left hemisphere  Clinical Correlation of the above findings indicates diffuse cerebral dysfunction that is non-specific in etiology and can be seen with hypoxic/ischemic injury, toxic/metabolic encephalopathies, or medication  effect. Additional focal slowing over the left hemisphere indicates focal cerebral dysfunction in this region suggestive of underlying structural or physiologic abnormality.  The absence of epileptiform discharges does not rule out a clinical diagnosis of epilepsy.  Clinical correlation is advised.   Patrcia Dolly, M.D.

## 2017-07-19 NOTE — Progress Notes (Addendum)
Occupational Therapy Session Note  Patient Details  Name: Kelly Romero MRN: [id number] Date of Birth: 11-27-1965  Today's Date: 07/19/2017 OT Individual Time: 4098-1191 and 1415-1500 OT Individual Time Calculation (min): 70 min and 45 min    Short Term Goals: Week 1:  OT Short Term Goal 1 (Week 1): Pt will maintain dynamic standing balance during toileting task with CGA OT Short Term Goal 2 (Week 1): Pt will complete LB dressing with steadying assist only OT Short Term Goal 3 (Week 1): Pt will use R UE during bathing task with min cuing for awareness OT Short Term Goal 4 (Week 1): Pt will consistently be oriented x4 with min questioning cues  Skilled Therapeutic Interventions/Progress Updates:    Session one: pt seen for OT ADL session. Pt sitting EOB upon arrival with NT present providing supervision for breakfast, hand off to OT.  Pt demonstrates B UE apraxia R>L during self-feeding tasks, able to scoop food onto utensil, however, requires significant time to bring food into mouth. Mod Cuing for swallowing per SLP orders due to R pocketing.  She ambulated with R HHA to gather clothing items, able to bend to low drawer and pick up shirt/pants using R hand with increased time, demonstrates improved R grasp strength and coordination. Upon walking into bathroom, pt became unresponsive, eyes remaining open and pt then coming into extensor tone and becoming diaphoretic. +2 assist required to safely return her to w/c, MD present and assessed. Pt returned to supine, BP 173/98, O2 98%. Pt becoming responsive though unable to recall most recent event. MD gave clearance to continue with bedside therapies to pt tolerance.  Following extended supine rest break while RN gave medications, pt came to sitting EOB to dress. Required min A for dynamic sitting balance EOB while dressing UB. Upon standing at bedside pt voiced "not feeling right" and returned to supine, BP 158/75. Pt able to bridge in order to  have pants pulled up rest of way. Pt left in supine at end of session to rest, all needs in reach and bed alarm on.  Rest of therapy team made aware of events of today's session.    Session Two: Pt seen for OT session focusing on neur re-ed with B UE tasks. Therapy took place at bedside per MD orders. Pt awake in supine upon arrival, voicing feeling better from this AM and agreeable to tx session. She transferred to EOB using hospital bed functions, assist required for reciprocal hip movements to advance towards EOB due to motor planning deficits. Seated EOB, completed B UE integration activities including folding shirt, over head ball raises (2 sets of 10), chest press (2 sets of 10)  using ball, and ball toss. Pt able to independently integrate B UE use, however, R UE at slowed and weaker level compared to L. Pt able to count 1-10 during ball raises with min facilitation. During ball toss activity, pt required to state a food with each toss working on functional communication skills and cognitive remediation, increased time required for pt to create word. Pt returned to supine at end of session, left with all needs in reach and bed alarm on.   Therapy Documentation Precautions:  Precautions Precautions: Fall Restrictions Weight Bearing Restrictions: No Pain:   Complaints of abdominal pain, MD aware, pt repositioned for comfort.  See Function Navigator for Current Functional Status.   Therapy/Group: Individual Therapy  Blong Busk L 07/19/2017, 7:11 AM

## 2017-07-20 ENCOUNTER — Inpatient Hospital Stay (HOSPITAL_COMMUNITY): Payer: Medicaid Other | Admitting: Physical Therapy

## 2017-07-20 ENCOUNTER — Ambulatory Visit: Payer: Self-pay | Admitting: Neurology

## 2017-07-20 ENCOUNTER — Inpatient Hospital Stay (HOSPITAL_COMMUNITY): Payer: Medicaid Other | Admitting: Occupational Therapy

## 2017-07-20 ENCOUNTER — Inpatient Hospital Stay (HOSPITAL_COMMUNITY): Payer: Medicaid Other

## 2017-07-20 ENCOUNTER — Encounter

## 2017-07-20 ENCOUNTER — Encounter (HOSPITAL_COMMUNITY): Payer: Medicaid Other | Admitting: Psychology

## 2017-07-20 ENCOUNTER — Encounter (HOSPITAL_COMMUNITY): Payer: Medicaid Other | Admitting: Speech Pathology

## 2017-07-20 DIAGNOSIS — R55 Syncope and collapse: Secondary | ICD-10-CM

## 2017-07-20 LAB — CBC
HEMATOCRIT: 33.1 % — AB (ref 36.0–46.0)
HEMOGLOBIN: 10.4 g/dL — AB (ref 12.0–15.0)
MCH: 28.6 pg (ref 26.0–34.0)
MCHC: 31.4 g/dL (ref 30.0–36.0)
MCV: 90.9 fL (ref 78.0–100.0)
Platelets: 240 10*3/uL (ref 150–400)
RBC: 3.64 MIL/uL — ABNORMAL LOW (ref 3.87–5.11)
RDW: 13.3 % (ref 11.5–15.5)
WBC: 4.3 10*3/uL (ref 4.0–10.5)

## 2017-07-20 LAB — BASIC METABOLIC PANEL
ANION GAP: 8 (ref 5–15)
BUN: 19 mg/dL (ref 6–20)
CO2: 30 mmol/L (ref 22–32)
Calcium: 9.2 mg/dL (ref 8.9–10.3)
Chloride: 103 mmol/L (ref 101–111)
Creatinine, Ser: 1.14 mg/dL — ABNORMAL HIGH (ref 0.44–1.00)
GFR calc Af Amer: >60 mL/min (ref >60)
GFR, EST NON AFRICAN AMERICAN: 54 mL/min — AB (ref >60)
Glucose, Bld: 108 mg/dL — ABNORMAL HIGH (ref 65–99)
POTASSIUM: 3.9 mmol/L (ref 3.5–5.1)
SODIUM: 141 mmol/L (ref 135–145)

## 2017-07-20 LAB — GLUCOSE, CAPILLARY
Glucose-Capillary: 94 mg/dL (ref 65–99)
Glucose-Capillary: 139 mg/dL — ABNORMAL HIGH (ref 65–99)
Glucose-Capillary: 89 mg/dL (ref 65–99)
Glucose-Capillary: 71 mg/dL (ref 65–99)

## 2017-07-20 LAB — AMMONIA: Ammonia: 14 umol/L (ref 9–35)

## 2017-07-20 MED ORDER — SODIUM CHLORIDE 0.9 % IV SOLN
INTRAVENOUS | Status: AC
  Administered 2017-07-20: 19:00:00 via INTRAVENOUS

## 2017-07-20 MED ORDER — VALPROATE SODIUM 250 MG/5ML PO SOLN
500.0000 mg | Freq: Two times a day (BID) | ORAL | Status: DC
  Administered 2017-07-20 – 2017-08-05 (×24): 500 mg via ORAL
  Filled 2017-07-20 (×34): qty 10

## 2017-07-20 MED ORDER — SIMETHICONE 80 MG PO CHEW
80.0000 mg | CHEWABLE_TABLET | Freq: Four times a day (QID) | ORAL | Status: DC
  Administered 2017-07-20 – 2017-07-29 (×24): 80 mg via ORAL
  Filled 2017-07-20 (×32): qty 1

## 2017-07-20 MED ORDER — GLUCOSE 40 % PO GEL
ORAL | Status: AC
  Filled 2017-07-20: qty 1

## 2017-07-20 MED ORDER — LEVETIRACETAM 500 MG PO TABS
500.0000 mg | ORAL_TABLET | Freq: Two times a day (BID) | ORAL | Status: DC
  Administered 2017-07-20 – 2017-07-21 (×3): 500 mg via ORAL
  Filled 2017-07-20 (×3): qty 1

## 2017-07-20 MED ORDER — LISINOPRIL 10 MG PO TABS
10.0000 mg | ORAL_TABLET | Freq: Two times a day (BID) | ORAL | Status: DC
  Administered 2017-07-20 – 2017-07-21 (×2): 10 mg via ORAL
  Filled 2017-07-20 (×2): qty 1

## 2017-07-20 NOTE — Progress Notes (Signed)
Speech Language Pathology Daily Session Note  Patient Details  Name: Kelly Romero MRN: [id number] Date of Birth: 1965/05/10  Today's Date: 07/20/2017 SLP Individual Time: 1030-1054 SLP Individual Time Calculation (min): 24 min  Short Term Goals: Week 1: SLP Short Term Goal 1 (Week 1): Pt will utilize word finding strategies to convey basic thoughts and ideas with Mod A cues.  SLP Short Term Goal 2 (Week 1): Pt will utilize speech intelligibility strategies with Mod A  to increase speech intelligibility to ~ 90% at the word level.  SLP Short Term Goal 3 (Week 1): Pt will solve semi-complex problem solving tasks with Mod A cues.  SLP Short Term Goal 4 (Week 1): Pt will demonstrate selective attention in moderately distracting environment for 30 minutes iwth Mod A cues.  SLP Short Term Goal 5 (Week 1): Pt will consume trials of dysphagia 2 with Mod A cues for use of compensatory swallow strategies to effectively clear oral residue.   Skilled Therapeutic Interventions:Skilled ST services focused on speech skills. Pt demonstrated overall lethargic behaviors and required max A verbal cues for sustained attention every 30 seconds to 1 minute, often falling asleep. SLP facilitated speech intelligibility at phrase level with 70% intelligibility given Max A verbal cues and mod A verbals at word level 80% intellgibility to overarticulate, increase vocal intensity and slow rate, while describing pictures.Pt demonstrated increase intelligibility at word level while reading with 90% intelligibility and mod-min A verbal cues. Pt was left in room with call bell within reach. Reccomend to continue skilled ST services.      Function:  Eating Eating                 Cognition Comprehension Comprehension assist level: Understands basic 75 - 89% of the time/ requires cueing 10 - 24% of the time  Expression   Expression assist level: Expresses basic 50 - 74% of the time/requires cueing 25 - 49% of the  time. Needs to repeat parts of sentences.;Expresses basic 25 - 49% of the time/requires cueing 50 - 75% of the time. Uses single words/gestures.  Social Interaction Social Interaction assist level: Interacts appropriately 75 - 89% of the time - Needs redirection for appropriate language or to initiate interaction.  Problem Solving Problem solving assist level: Solves basic 75 - 89% of the time/requires cueing 10 - 24% of the time;Solves basic 50 - 74% of the time/requires cueing 25 - 49% of the time  Memory Memory assist level: Recognizes or recalls 75 - 89% of the time/requires cueing 10 - 24% of the time;Recognizes or recalls 50 - 74% of the time/requires cueing 25 - 49% of the time    Pain Pain Assessment Pain Score: 0-No pain  Therapy/Group: Individual Therapy  Rasheedah Reis  [facility] 07/20/2017, 10:56 AM

## 2017-07-20 NOTE — Progress Notes (Signed)
Subjective/Complaints: With OT toileting when pt became unresponsive.  No shaking, chronic drooling.  Pt was taken to bed and placed in Trendelenburg position with initial BP 190/90    Has chronic HA, no other pain except abdominal tenderness lower abd  ROS-  Limited due to dysarthria and cognition, denies CP, SOB, vomiting, diarrhea.  Objective: Vital Signs: Blood pressure (!) 157/74, pulse 81, temperature 98.2 F (36.8 C), temperature source Oral, resp. rate 12, height '5\' 5"'$  (1.651 m), weight 96.1 kg (211 lb 13.8 oz), SpO2 96 %. Ct Head Wo Contrast  Result Date: 07/19/2017 CLINICAL DATA:  Possible seizure today. EXAM: CT HEAD WITHOUT CONTRAST TECHNIQUE: Contiguous axial images were obtained from the base of the skull through the vertex without intravenous contrast. COMPARISON:  Brain MRI 07/11/2017.  Head CT scan 07/08/2017. FINDINGS: Brain: No evidence of acute infarction, hemorrhage, hydrocephalus, extra-axial collection or mass lesion/mass effect. Atrophy, chronic microvascular ischemic change and remote left PCA territory infarct are seen as on the prior exams. Vascular: Atherosclerosis noted. Skull: Intact. Sinuses/Orbits: Status post bilateral lens extraction. Paranasal sinuses and mastoid air cells are clear. Other: None. IMPRESSION: No acute abnormality. Chronic microvascular ischemic change and remote left PCA infarct. Atherosclerosis. Electronically Signed   By: Inge Rise M.D.   On: 07/19/2017 12:28   Results for orders placed or performed during the hospital encounter of 07/13/17 (from the past 72 hour(s))  Glucose, capillary     Status: Abnormal   Collection Time: 07/17/17 11:47 AM  Result Value Ref Range   Glucose-Capillary 105 (H) 65 - 99 mg/dL   Comment 1 Notify RN   Glucose, capillary     Status: None   Collection Time: 07/17/17  5:21 PM  Result Value Ref Range   Glucose-Capillary 81 65 - 99 mg/dL   Comment 1 Notify RN   Glucose, capillary     Status: None    Collection Time: 07/17/17  9:08 PM  Result Value Ref Range   Glucose-Capillary 85 65 - 99 mg/dL  Glucose, capillary     Status: None   Collection Time: 07/18/17  6:40 AM  Result Value Ref Range   Glucose-Capillary 89 65 - 99 mg/dL   Comment 1 Notify RN   Glucose, capillary     Status: Abnormal   Collection Time: 07/18/17 12:09 PM  Result Value Ref Range   Glucose-Capillary 132 (H) 65 - 99 mg/dL  Glucose, capillary     Status: Abnormal   Collection Time: 07/18/17  4:31 PM  Result Value Ref Range   Glucose-Capillary 140 (H) 65 - 99 mg/dL  Glucose, capillary     Status: Abnormal   Collection Time: 07/18/17  8:58 PM  Result Value Ref Range   Glucose-Capillary 116 (H) 65 - 99 mg/dL   Comment 1 Notify RN   Glucose, capillary     Status: None   Collection Time: 07/19/17  6:38 AM  Result Value Ref Range   Glucose-Capillary 96 65 - 99 mg/dL   Comment 1 Notify RN   Glucose, capillary     Status: Abnormal   Collection Time: 07/19/17  8:06 AM  Result Value Ref Range   Glucose-Capillary 132 (H) 65 - 99 mg/dL  Valproic acid level     Status: None   Collection Time: 07/19/17  8:19 AM  Result Value Ref Range   Valproic Acid Lvl 53 50.0 - 100.0 ug/mL    Comment: Performed at [facility], [address] [place],  [address]  Comprehensive metabolic panel     Status: Abnormal   Collection Time: 07/19/17  9:44 AM  Result Value Ref Range   Sodium 140 135 - 145 mmol/L   Potassium 3.7 3.5 - 5.1 mmol/L   Chloride 102 101 - 111 mmol/L   CO2 30 22 - 32 mmol/L   Glucose, Bld 155 (H) 65 - 99 mg/dL   BUN 15 6 - 20 mg/dL   Creatinine, Ser 1.14 (H) 0.44 - 1.00 mg/dL   Calcium 9.3 8.9 - 10.3 mg/dL   Total Protein 6.1 (L) 6.5 - 8.1 g/dL   Albumin 2.8 (L) 3.5 - 5.0 g/dL   AST 18 15 - 41 U/L   ALT 12 (L) 14 - 54 U/L   Alkaline Phosphatase 60 38 - 126 U/L   Total Bilirubin 0.4 0.3 - 1.2 mg/dL   GFR calc non Af Amer 54 (L) >60 mL/min   GFR calc Af Amer >60 >60 mL/min    Comment:  (NOTE) The eGFR has been calculated using the CKD EPI equation. This calculation has not been validated in all clinical situations. eGFR's persistently <60 mL/min signify possible Chronic Kidney Disease.    Anion gap 8 5 - 15    Comment: Performed at [facility] [address] [place], [place] [address]  CBC with Differential/Platelet     Status: Abnormal   Collection Time: 07/19/17  9:44 AM  Result Value Ref Range   WBC 4.7 4.0 - 10.5 K/uL   RBC 3.63 (L) 3.87 - 5.11 MIL/uL   Hemoglobin 10.2 (L) 12.0 - 15.0 g/dL   HCT 32.8 (L) 36.0 - 46.0 %   MCV 90.4 78.0 - 100.0 fL   MCH 28.1 26.0 - 34.0 pg   MCHC 31.1 30.0 - 36.0 g/dL   RDW 13.3 11.5 - 15.5 %   Platelets 252 150 - 400 K/uL   Neutrophils Relative % 71 %   Neutro Abs 3.3 1.7 - 7.7 K/uL   Lymphocytes Relative 21 %   Lymphs Abs 1.0 0.7 - 4.0 K/uL   Monocytes Relative 7 %   Monocytes Absolute 0.3 0.1 - 1.0 K/uL   Eosinophils Relative 1 %   Eosinophils Absolute 0.1 0.0 - 0.7 K/uL   Basophils Relative 0 %   Basophils Absolute 0.0 0.0 - 0.1 K/uL    Comment: Performed at [facility] [address], [place], [place] [address]  Glucose, capillary     Status: Abnormal   Collection Time: 07/19/17 11:50 AM  Result Value Ref Range   Glucose-Capillary 150 (H) 65 - 99 mg/dL  Glucose, capillary     Status: Abnormal   Collection Time: 07/19/17  4:39 PM  Result Value Ref Range   Glucose-Capillary 102 (H) 65 - 99 mg/dL  Glucose, capillary     Status: Abnormal   Collection Time: 07/19/17 10:16 PM  Result Value Ref Range   Glucose-Capillary 120 (H) 65 - 99 mg/dL  Basic metabolic panel     Status: Abnormal   Collection Time: 07/20/17  6:25 AM  Result Value Ref Range   Sodium 141 135 - 145 mmol/L   Potassium 3.9 3.5 - 5.1 mmol/L   Chloride 103 101 - 111 mmol/L   CO2 30 22 - 32 mmol/L   Glucose, Bld 108 (H) 65 - 99 mg/dL   BUN 19 6 - 20 mg/dL   Creatinine, Ser 1.14 (H) 0.44 - 1.00 mg/dL   Calcium 9.2 8.9 - 10.3 mg/dL  GFR calc non Af Amer 54 (L) >60 mL/min   GFR calc Af Amer >60 >60 mL/min    Comment: (NOTE) The eGFR has been calculated using the CKD EPI equation. This calculation has not been validated in all clinical situations. eGFR's persistently <60 mL/min signify possible Chronic Kidney Disease.    Anion gap 8 5 - 15    Comment: Performed at [facility] [address], [place], [place] [address]  CBC     Status: Abnormal   Collection Time: 07/20/17  6:25 AM  Result Value Ref Range   WBC 4.3 4.0 - 10.5 K/uL   RBC 3.64 (L) 3.87 - 5.11 MIL/uL   Hemoglobin 10.4 (L) 12.0 - 15.0 g/dL   HCT 33.1 (L) 36.0 - 46.0 %   MCV 90.9 78.0 - 100.0 fL   MCH 28.6 26.0 - 34.0 pg   MCHC 31.4 30.0 - 36.0 g/dL   RDW 13.3 11.5 - 15.5 %   Platelets 240 150 - 400 K/uL    Comment: Performed at [facility], [place] [address] [place], [place] [address]  Glucose, capillary     Status: None   Collection Time: 07/20/17  7:03 AM  Result Value Ref Range   Glucose-Capillary 94 65 - 99 mg/dL     Constitutional: No distress . Vital signs reviewed. HENT: Normocephalic.  Atraumatic. Eyes: EOMI. No discharge. Cardiovascular: RRR. No JVD. Respiratory: CTA Bilaterally. Normal effort. GI: BS +. Non-distended.Mild lower abd tenderness Musc: No edema or tenderness in extremities. Skin:   Intact. Warm and dry. Neuro:  Alert Right facial droop Alert/Oriented to hsop but Oval Linsey, oriented to person not time Motor: 4/5 RUE proximal to distal- stable 4+/5 LUE proximal to distal- stable RLE: 3-/5 HF, KE, ADF, 4/5 (stable) LLE: 3-/5 HF, KE, ADF stable Dysarthria Psych: Very flat.  Assessment/Plan: 1. 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 Physiatrist is providing close team  supervision and 24 hour management of active medical problems listed below. Physiatrist and rehab team continue to assess barriers to discharge/monitor patient progress toward functional and medical goals. FIM: Function - Bathing Bathing activity did not occur: Refused Position: Shower Body parts bathed by patient: Chest, Abdomen, Front perineal area, Right upper leg, Left upper leg Body parts bathed by helper: Left arm, Right lower leg, Left lower leg, Back, Buttocks, Right arm  Function- Upper Body Dressing/Undressing What is the patient wearing?: Pull over shirt/dress Pull over shirt/dress - Perfomed by patient: Thread/unthread left sleeve, Put head through opening, Thread/unthread right sleeve, Pull shirt over trunk Pull over shirt/dress - Perfomed by helper: Thread/unthread right sleeve, Pull shirt over trunk Assist Level: Supervision or verbal cues Function - Lower Body Dressing/Undressing What is the patient wearing?: Pants Position: Sitting EOB Pants- Performed by patient: Thread/unthread right pants leg, Thread/unthread left pants leg Pants- Performed by helper: Pull pants up/down Non-skid slipper socks- Performed by helper: Don/doff right sock, Don/doff left sock Socks - Performed by helper: Don/doff right sock, Don/doff left sock Shoes - Performed by patient: Don/doff right shoe, Fasten right Shoes - Performed by helper: Don/doff left shoe, Fasten left TED Hose - Performed by helper: Don/doff right TED hose, Don/doff left TED hose Assist for footwear: Partial/moderate assist Assist for lower body dressing: Touching or steadying assistance (Pt > 75%)  Function -  Toileting Toileting steps completed by helper: Adjust clothing prior to toileting, Performs perineal hygiene, Adjust clothing after toileting Toileting Assistive Devices: Grab bar or rail Assist level: Touching or steadying assistance (Pt.75%)  Function - Toilet Transfers Toilet transfer assistive device: Grab  bar Assist level to toilet: Moderate assist (Pt 50 - 74%/lift or lower) Assist level from toilet: Moderate assist (Pt 50 - 74%/lift or lower) Assist level to bedside commode (at bedside): Moderate assist (Pt 50 - 74%/lift or lower)(per Leann Nickles-Wright, NT) Assist level from bedside commode (at bedside): Moderate assist (Pt 50 - 74%/lift or lower)  Function - Chair/bed transfer Chair/bed transfer method: Ambulatory Chair/bed transfer assist level: Touching or steadying assistance (Pt > 75%) Chair/bed transfer assistive device: Bedrails, Armrests  Function - Locomotion: Wheelchair Will patient use wheelchair at discharge?: No Wheelchair activity did not occur: Safety/medical concerns Wheel 50 feet with 2 turns activity did not occur: Safety/medical concerns Wheel 150 feet activity did not occur: Safety/medical concerns Function - Locomotion: Ambulation Assistive device: No device Max distance: 100' Assist level: Touching or steadying assistance (Pt > 75%) Assist level: Touching or steadying assistance (Pt > 75%) Assist level: Touching or steadying assistance (Pt > 75%) Walk 150 feet activity did not occur: Safety/medical concerns Assist level: Moderate assist (Pt 50 - 74%) Assist level: Moderate assist (Pt 50 - 74%)  Function - Comprehension Comprehension: Auditory Comprehension assist level: Understands basic 75 - 89% of the time/ requires cueing 10 - 24% of the time  Function - Expression Expression: Verbal Expression assist level: Expresses basic 75 - 89% of the time/requires cueing 10 - 24% of the time. Needs helper to occlude trach/needs to repeat words.  Function - Social Interaction Social Interaction assist level: Interacts appropriately 75 - 89% of the time - Needs redirection for appropriate language or to initiate interaction.  Function - Problem Solving Problem solving assist level: Solves basic 75 - 89% of the time/requires cueing 10 - 24% of the time  Function  - Memory Memory assist level: Recognizes or recalls 75 - 89% of the time/requires cueing 10 - 24% of the time Patient normally able to recall (first 3 days only): Current season, That he or she is in a hospital, Location of own room   Medical Problem List and Plan:  1. Right hemiparesis and functional deficits secondary to left periventricular white matter and right frontal lobe infarcts    Cont CIR - bedside therapy today   2. DVT Prophylaxis/Anticoagulation: Pharmaceutical: Lovenox '40mg'$  q 24h 3. Chronic HA/Pain Management: Depakote bid with tylenol prn.  Lower extremity paresthesias  Trial Cymbalta 4. Mood: LCSW to follow for evaluation and support.    Emotional lability trial of Cymbalta.    If this is not helpful may consider SSRI 5. Neuropsych: This patient is not fully capable of making decisions on her own behalf.  6. Skin/Wound Care: routine pressure relief measures.  7. Fluids/Electrolytes/Nutrition: Monitor I/Os  8. 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 Monitor BS ac/hs. Educate on importance of compliance.  CBG (last 3)  Recent Labs    07/19/17 1639 07/19/17 2216 07/20/17 0703  GLUCAP 102* 120* 94   Relatively controlled on 5/6 9. Dyslipidemia: On Crestor.  10. Anemia: Hgb stable at 9.5 on 5/1 11. HTN with orthostasis- will check q shift, syncopal episode today Monitor BP bid. Reduce lisinopril Vitals:   07/19/17 2108 07/20/17 0502  BP: (!) 187/77 (!) 157/74  Pulse: 79 81  Resp: 18 12  Temp: 98.4 F (36.9 C) 98.2 F (36.8 C)  SpO2: 99% 96%   Ordered TEDs  Abdominal binder    Continue to monitor 12. Hypoalbuminemia  Supplement initiated on 5/4 13.  AMS probable seizure,CT neg forhemmorhagic transformation, check EEG On Depakene, reviewed Neuro outpt note, possible seizure in 2017 , has also been treated for migraines,check valproate level stat may need increased dose of  depakene 14.  Mild abd tenderness check KUB LOS (Days) 7 A FACE TO FACE EVALUATION WAS PERFORMED  Charlett Blake 07/20/2017, 8:19 AM

## 2017-07-20 NOTE — Consult Note (Signed)
Cardiology Consultation:   Patient ID: KEERTHI HAZELL; [id number]; 1965/09/13   Admit date: 07/13/2017 Date of Consult: 07/20/2017  Primary Care Provider: Noni Saupe, MD Primary Cardiologist: No primary care provider on file. Dr. Allyson Sabal Primary Electrophysiologist:  Dr. Johney Frame   Patient Profile:   Kelly Romero is a 51 y.o. female with a hx of stroke who is being seen today for the evaluation of syncope at the request of Dr. Wynn Banker.  History of Present Illness:   Kelly Romero has a history of risk factors for vascular disease including diabetes, hyperlipidemia and hypertension.  Unfortunately, she had a stroke in April 2019.  She was transferred to inpatient rehab.  She has been there for about a week.  Today, she was helped up for therapy.  She then began to appear dizzy and passed out.  The syncope did not last long.  Orthostatic blood pressures were checked per the nurse and there was a significant drop in systolic blood pressure when changing positions.  This occurred going from lying to sitting.  When he tried to do it going from sitting to standing, the patient became lightheaded after about 20 seconds, prior to being able to get the blood pressure reading.  The nurse reported to me that they again tried to get her up for therapy in the afternoon, and she again became lightheaded after about 20 seconds of standing.  The patient states she has not had an appetite for several days.  The nurse reports that she has not eaten well today.  The patient also reports some diarrhea but this was not confirmed by the nursing staff.  No chest pain or palpitations reported.  Past Medical History:  Diagnosis Date  . Diabetes mellitus without complication (HCC)   . Headache   . Hyperlipidemia   . Hypertension   . Stroke (HCC)   . TIA (transient ischemic attack)     Past Surgical History:  Procedure Laterality Date  . APPENDECTOMY    . CHOLECYSTECTOMY    . FOOT GANGLION  EXCISION    . HERNIA REPAIR    . IR GENERIC HISTORICAL  12/30/2015   IR ANGIO INTRA EXTRACRAN SEL COM CAROTID INNOMINATE BILAT MOD SED 12/30/2015 Julieanne Cotton, MD [facility]  . IR GENERIC HISTORICAL  12/30/2015   IR ANGIO VERTEBRAL SEL VERTEBRAL BILAT MOD SED 12/30/2015 Julieanne Cotton, MD [facility]  . LOOP RECORDER INSERTION N/A 07/13/2017   Procedure: LOOP RECORDER INSERTION;  Surgeon: Hillis Range, MD;  Location: [facility];  Service: Cardiovascular;  Laterality: N/A;  . TEE WITHOUT CARDIOVERSION N/A 04/19/2015   Procedure: TRANSESOPHAGEAL ECHOCARDIOGRAM (TEE);  Surgeon: Jake Bathe, MD;  Location: [facility];  Service: Cardiovascular;  Laterality: N/A;  . VAGINAL HYSTERECTOMY         Inpatient Medications: Scheduled Meds: . aspirin EC  81 mg Oral Daily  . clopidogrel  75 mg Oral Daily  . DULoxetine  30 mg Oral Daily  . enoxaparin (LOVENOX) injection  40 mg Subcutaneous Q24H  . feeding supplement (PRO-STAT SUGAR FREE 64)  30 mL Oral BID  . insulin aspart  0-15 Units Subcutaneous TID AC & HS  . insulin detemir  20 Units Subcutaneous Daily  . levETIRAcetam  500 mg Oral BID  . lisinopril  10 mg Oral BID  . rosuvastatin  40 mg Oral QHS  . simethicone  80 mg Oral QID  . Valproate Sodium  500 mg Oral BID   Continuous Infusions:  PRN Meds: acetaminophen, alum & mag hydroxide-simeth, bisacodyl, diphenhydrAMINE, guaiFENesin-dextromethorphan, polyethylene glycol, prochlorperazine **OR** prochlorperazine **OR** prochlorperazine, sodium phosphate  Allergies:    Allergies  Allergen Reactions  . Erythromycin Itching  . Latex Itching    Social History:   Social History   Socioeconomic History  . Marital status: Married    Spouse name: Not on file  . Number of children: Not on file  . Years of education: Not on file  . Highest education level: Not on file  Occupational History  . Occupation: Data processing manager  Social Needs  . Financial resource strain:  Not on file  . Food insecurity:    Worry: Not on file    Inability: Not on file  . Transportation needs:    Medical: Not on file    Non-medical: Not on file  Tobacco Use  . Smoking status: Never Smoker  . Smokeless tobacco: Never Used  Substance and Sexual Activity  . Alcohol use: No    Alcohol/week: 0.0 oz    Comment: rare   . Drug use: No  . Sexual activity: Not Currently  Lifestyle  . Physical activity:    Days per week: Not on file    Minutes per session: Not on file  . Stress: Not on file  Relationships  . Social connections:    Talks on phone: Not on file    Gets together: Not on file    Attends religious service: Not on file    Active member of club or organization: Not on file    Attends meetings of clubs or organizations: Not on file    Relationship status: Not on file  . Intimate partner violence:    Fear of current or ex partner: Not on file    Emotionally abused: Not on file    Physically abused: Not on file    Forced sexual activity: Not on file  Other Topics Concern  . Not on file  Social History Narrative  . Not on file    Family History:    Family History  Problem Relation Age of Onset  . Stroke Father        76s  . Heart attack Father 66  . COPD Mother   . Heart failure Brother   . Cancer Maternal Grandmother        unknown   . COPD Unknown   . Heart failure Maternal Grandfather   . Hypertension Maternal Grandfather   . Cancer Paternal Grandfather        lung  . Diabetes Paternal Grandmother      ROS:  Please see the history of present illness.  Syncope with standing, difficulty speaking after stroke All other ROS reviewed and negative.     Physical Exam/Data:   Vitals:   07/19/17 1326 07/19/17 2108 07/20/17 0502 07/20/17 1804  BP: (!) 186/79 (!) 187/77 (!) 157/74   Pulse: 80 79 81   Resp: (!) Temp: 98.7 F (37.1 C) 98.4 F (36.9 C) 98.2 F (36.8 C)   TempSrc: Oral Oral Oral   SpO2: 97% 99% 96%   Weight:    210 lb  5.1 oz (95.4 kg)  Height:        Intake/Output Summary (Last 24 hours) at 07/20/2017 1813 Last data filed at 07/20/2017 1250 Gross per 24 hour  Intake 0 ml  Output -  Net 0 ml   Filed Weights   07/18/17 0221 07/19/17 0134 07/20/17 1804  Weight: 211  lb 13.8 oz (96.1 kg) 211 lb 13.8 oz (96.1 kg) 210 lb 5.1 oz (95.4 kg)   Body mass index is 35 kg/m.  General:  Well nourished, well developed, in no acute distress HEENT: normal Lymph: no adenopathy Neck: no JVD Endocrine:  No thryomegaly Vascular: No carotid bruits;   Cardiac:  normal S1, S2; RRR; no murmur  Lungs:  clear to auscultation bilaterally, no wheezing, rhonchi or rales  Abd: soft, nontender, no hepatomegaly  Ext: no edema Musculoskeletal:  No deformities, BUE and BLE strength normal and equal Skin: warm and dry  Neuro: Slow speech Psych: Flat affect   EKG: None recently Telemetry: Not on telemetry  Relevant CV Studies: Echo from April 2019 showed normal left ventricular function; study could not be completed due to panic attack  Laboratory Data:  Chemistry Recent Labs  Lab 07/14/17 0549 07/19/17 0944 07/20/17 0625  NA 142 140 141  K 3.6 3.7 3.9  CL 105 102 103  CO2 GLUCOSE 120* 155* 108*  BUN CREATININE 0.95 1.14* 1.14*  CALCIUM 9.5 9.3 9.2  GFRNONAA >60 54* 54*  GFRAA >60 >60 >60  ANIONGAP Recent Labs  Lab 07/14/17 0549 07/19/17 0944  PROT 5.6* 6.1*  ALBUMIN 2.5* 2.8*  AST 14* 18  ALT 10* 12*  ALKPHOS 59 60  BILITOT 0.6 0.4   Hematology Recent Labs  Lab 07/14/17 0549 07/19/17 0944 07/20/17 0625  WBC 4.8 4.7 4.3  RBC 3.30* 3.63* 3.64*  HGB 9.5* 10.2* 10.4*  HCT 29.8* 32.8* 33.1*  MCV 90.3 90.4 90.9  MCH 28.8 28.1 28.6  MCHC 31.9 31.1 31.4  RDW 14.1 13.3 13.3  PLT 252 252 240   Cardiac EnzymesNo results for input(s): TROPONINI in the last 168 hours. No results for input(s): TROPIPOC in the last 168 hours.  BNPNo results for input(s): BNP, PROBNP in the  last 168 hours.  DDimer No results for input(s): DDIMER in the last 168 hours.  Radiology/Studies:  Dg Abd 1 View  Result Date: 07/20/2017 CLINICAL DATA:  Left lower quadrant pain EXAM: ABDOMEN - 1 VIEW COMPARISON:  05/03/2014 FINDINGS: Postsurgical changes are noted in the anterior abdominal wall consistent with prior hernia repair. Changes of a previous cholecystectomy are seen as well. Scattered large and small bowel gas is noted. No bony abnormality is seen. IMPRESSION: No acute abnormality noted. Electronically Signed   By: Alcide Clever M.D.   On: 07/20/2017 10:50   Ct Head Wo Contrast  Result Date: 07/19/2017 CLINICAL DATA:  Possible seizure today. EXAM: CT HEAD WITHOUT CONTRAST TECHNIQUE: Contiguous axial images were obtained from the base of the skull through the vertex without intravenous contrast. COMPARISON:  Brain MRI 07/11/2017.  Head CT scan 07/08/2017. FINDINGS: Brain: No evidence of acute infarction, hemorrhage, hydrocephalus, extra-axial collection or mass lesion/mass effect. Atrophy, chronic microvascular ischemic change and remote left PCA territory infarct are seen as on the prior exams. Vascular: Atherosclerosis noted. Skull: Intact. Sinuses/Orbits: Status post bilateral lens extraction. Paranasal sinuses and mastoid air cells are clear. Other: None. IMPRESSION: No acute abnormality. Chronic microvascular ischemic change and remote left PCA infarct. Atherosclerosis. Electronically Signed   By: Drusilla Kanner M.D.   On: 07/19/2017 12:28    Assessment and Plan:   1. Syncope: Symptoms related to position.  Appears to be orthostatic in nature.  Will give IV fluids over the next 20 hours.  We will see if she has any  improvement.  Hopefully, her appetite will improve.  Also, we will try to see if any arrhythmia was noted by her loop recorder.  Bradycardia detection was apparently turned on after her syncopal episode. 2. Hyperlipidemia: Continue lipid-lowering therapy.   For  questions or updates, please contact [facility] Please consult [email or web address] for contact info under Cardiology/STEMI.   SignedLance Muss, MD  07/20/2017 6:13 PM

## 2017-07-20 NOTE — Progress Notes (Addendum)
Physical Therapy Session Note  Patient Details  Name: Kelly Romero MRN: [id number] Date of Birth: 30-Jul-1965  Today's Date: 07/20/2017 PT Individual Time: 9563-8756 PT Individual Time Calculation (min): 53 min   Short Term Goals: Week 1:  PT Short Term Goal 1 (Week 1): Pt will ambulate 75 ft with LRAD & supervision. PT Short Term Goal 2 (Week 1): Pt will negotiate 12 steps with B rails & mod assist for strengthening purposes.  Skilled Therapeutic Interventions/Progress Updates:  RN consulted & cleared pt for therapy. Pt received in bed reporting she doesn't feel well following episode this morning but agreeable to tx. Session focused on pt education and assessing BP in various positions with additional assist for BP management. Please see vitals below. When sitting EOB pt reports it "feels like the fluid in my head is going down". No nystagmus noted during session. Therapist provides total assist for donning thigh high ted hose & education regarding BP management. Pt's daughter called & asked to speak with therapist (on speaker) who educated her on pt's episode this morning. At end of session pt left in bed with all needs within reach & alarm set. Therapist educated RN & PA (Pam) of BP findings & pt's complaints, as well as daughter's request for PA to call her.   Pt does give inconsistent answers regarding dizziness while sitting EOB.  Pt unable to recall correct, current month despite max cuing.  Therapy Documentation Precautions:  Precautions Precautions: Fall Restrictions Weight Bearing Restrictions: No  Vital Signs: All BP assessed in LUE. Supine: 173/78 mmHg, HR = 79 Sitting at 0 minutes: 122/63 mmHg, HR = 85 bpm with pt c/o dizziness Sitting EOB at 3 minute: 146/79 mmHg, HR = 85 bpm, pt with continued c/o dizziness  Thigh high ted hose donned: Supine: 165/81 mmHg, HR = 81 bpm Sitting at 0 minutes: 122/67 mmHg, HR = 87 bpm Sitting at 3 minutes: 123/68 mmHg, HR = 82  bpm  See Function Navigator for Current Functional Status.   Therapy/Group: Individual Therapy  Sandi Mariscal 07/20/2017, 12:21 PM

## 2017-07-20 NOTE — Progress Notes (Signed)
Occupational Therapy Session Note  Patient Details  Name: Kelly Romero MRN: [id number] Date of Birth: [birth year]  Today's Date: 07/20/2017 OT Individual Time: 7829-5621 and 1430-1500 OT Individual Time Calculation (min): 55 min and 30 min   Short Term Goals: Week 1:  OT Short Term Goal 1 (Week 1): Pt will maintain dynamic standing balance during toileting task with CGA OT Short Term Goal 2 (Week 1): Pt will complete LB dressing with steadying assist only OT Short Term Goal 3 (Week 1): Pt will use R UE during bathing task with min cuing for awareness OT Short Term Goal 4 (Week 1): Pt will consistently be oriented x4 with min questioning cues  Skilled Therapeutic Interventions/Progress Updates:    Session One:  Pt awake in supine upon arrival, no complaints of pain and desiring to shower. She transferred to EOB with min A using hospital bed functions, difficulty motor planning reciprocal scooting to come to EOB. She ambulated with HHA to drawers to gather clothing items, able to pick up items with R hand and increased time. Upon bending over to pick up clothing items pt voiced feeling dizzy and need to return to sitting. Assisted to safely transfer pt back to sitting EOB, BP 108/60. Following seated rest break. She competed stand pivot transfer EOB> w/c, w/c> toilet, completed with min A and use of grab bars. Pt cont with complaints of dizziness while seated on toilet,  Sat ~5 minutes on toilet with no relief of symptoms. Upon standing for hygiene to be completed and transfer back to w/c, pt with syncope episode, fainting back into w/c. Medical team called with RN and MD arriving to assist, total A transfer back to w/c with pr becoming responsive. Bed placed in Trendelenberg and BP assessed, 193/91. Following rest, pt rolled in bed for hygiene to be completed and brief donned total A.  Pt left in supine at end of session, all needs in reach with bed alarm on. Pt voiced complaints of "pressure in  the back of my head", MD and PA made aware. Pt tearful and apologetic regarding events of session, desiring to complete therapies as well, support, encouragement and education provided.   Session Two: Pt seen for OT session focusing on upright tolerance. Pt awake in supine upon arrival, agreeable to tx session and reports feeling better this PM. Focus of session on pt tolerance to upright mobility, see below for BP details of positioning, all taken with TED and abdominal binder donned:   Supine: 188/80 (assymptomatic) EOB: 134/68 (assymptomatic)  During attempt to obtain standing BP, pt with syncope episode, returned safely to sitting EOB, RN aware and came to assess. BP sitting EOB following this event, 131/66.  Pt inconsistent and not reliant with verbalizing BP symptoms.   Pt returned safely to supine and HOB raised to pt tolerance, 35 degrees. Pt refused any fluids this session despite education of dehydration risk. Pt agreeable to eating pudding. She was able to use R UE at stabilizer level to hold container in order to self feed with L UE. Mod cuing for swallowing techniques per SLP orders. Pt left sitting upright in bed at end of session, education provided regarding safely building upright tolerance. Bed alarm on and all needs in reach.   Therapy Documentation Precautions:  Precautions Precautions: Fall Restrictions Weight Bearing Restrictions: No Pain:   No/denies pain  See Function Navigator for Current Functional Status.   Therapy/Group: Individual Therapy  Kiyoko Mcguirt L 07/20/2017, 7:05 AM

## 2017-07-20 NOTE — Progress Notes (Signed)
Called to pt's room by OT. Pt became unresponsive during toileting. MD at bedside. BP elevated after pt placed in trendelenburg. BP meds recently changed. Will continue to monitor. Orthostatics to be taken per shift. Pt tearful, denies pain to RN.

## 2017-07-20 NOTE — Progress Notes (Signed)
Abdominal binder placed. Pt reports feeling improved form this AM episode.

## 2017-07-20 NOTE — Progress Notes (Signed)
Orthopedic Tech Progress Note Patient Details:  Kelly Romero 03/14/1966 [id number]  Ortho Devices Type of Ortho Device: Abdominal binder Ortho Device/Splint Location: abdomen Ortho Device/Splint Interventions: Freeman Caldron, Cori Henningsen 07/20/2017, 12:03 PM

## 2017-07-20 NOTE — Progress Notes (Signed)
Speech Language Pathology Daily Session Note  Patient Details  Name: Kelly Romero MRN: [id number] Date of Birth: 08-Jul-1965  Today's Date: 07/20/2017 SLP Individual Time: 0940-1010 SLP Individual Time Calculation (min): 30 min  Short Term Goals: Week 1: SLP Short Term Goal 1 (Week 1): Pt will utilize word finding strategies to convey basic thoughts and ideas with Mod A cues.  SLP Short Term Goal 2 (Week 1): Pt will utilize speech intelligibility strategies with Mod A  to increase speech intelligibility to ~ 90% at the word level.  SLP Short Term Goal 3 (Week 1): Pt will solve semi-complex problem solving tasks with Mod A cues.  SLP Short Term Goal 4 (Week 1): Pt will demonstrate selective attention in moderately distracting environment for 30 minutes iwth Mod A cues.  SLP Short Term Goal 5 (Week 1): Pt will consume trials of dysphagia 2 with Mod A cues for use of compensatory swallow strategies to effectively clear oral residue.   Skilled Therapeutic Interventions: Skilled treatment session focused on speech goals. Patient had a MBS scheduled today, however, it was cancelled per physician's request due to syncope episode this morning. Upon arrival, patient appeared lethargic. SLP facilitated session by providing Max A verbal cues for use of speech intelligibility strategies at the phrase and sentence level during an informal conversation to achieve ~75% intelligibility, however, was Mod I for word-finding. Patient left supine in bed with alarm on and all needs within reach.      Function:   Cognition Comprehension Comprehension assist level: Understands basic 75 - 89% of the time/ requires cueing 10 - 24% of the time  Expression   Expression assist level: Expresses basic 50 - 74% of the time/requires cueing 25 - 49% of the time. Needs to repeat parts of sentences.;Expresses basic 25 - 49% of the time/requires cueing 50 - 75% of the time. Uses single words/gestures.  Social Interaction  Social Interaction assist level: Interacts appropriately 75 - 89% of the time - Needs redirection for appropriate language or to initiate interaction.  Problem Solving Problem solving assist level: Solves basic 75 - 89% of the time/requires cueing 10 - 24% of the time;Solves basic 50 - 74% of the time/requires cueing 25 - 49% of the time  Memory Memory assist level: Recognizes or recalls 50 - 74% of the time/requires cueing 25 - 49% of the time    Pain No/Denies pain   Therapy/Group: Individual Therapy  Samai Corea 07/20/2017, 3:37 PM

## 2017-07-21 ENCOUNTER — Inpatient Hospital Stay (HOSPITAL_COMMUNITY): Payer: Medicaid Other | Admitting: Occupational Therapy

## 2017-07-21 ENCOUNTER — Inpatient Hospital Stay (HOSPITAL_COMMUNITY): Payer: Medicaid Other | Admitting: Speech Pathology

## 2017-07-21 ENCOUNTER — Encounter (HOSPITAL_COMMUNITY): Payer: Medicaid Other | Admitting: Speech Pathology

## 2017-07-21 ENCOUNTER — Inpatient Hospital Stay (HOSPITAL_COMMUNITY): Payer: Medicaid Other

## 2017-07-21 ENCOUNTER — Inpatient Hospital Stay (HOSPITAL_COMMUNITY): Payer: Medicaid Other | Admitting: Physical Therapy

## 2017-07-21 LAB — GLUCOSE, CAPILLARY
GLUCOSE-CAPILLARY: 106 mg/dL — AB (ref 65–99)
GLUCOSE-CAPILLARY: 84 mg/dL (ref 65–99)
GLUCOSE-CAPILLARY: 90 mg/dL (ref 65–99)
Glucose-Capillary: 134 mg/dL — ABNORMAL HIGH (ref 65–99)
Glucose-Capillary: 118 mg/dL — ABNORMAL HIGH (ref 65–99)

## 2017-07-21 MED ORDER — LISINOPRIL 20 MG PO TABS
20.0000 mg | ORAL_TABLET | Freq: Two times a day (BID) | ORAL | Status: DC
  Administered 2017-07-21 – 2017-07-27 (×11): 20 mg via ORAL
  Filled 2017-07-21 (×14): qty 1

## 2017-07-21 MED ORDER — LEVETIRACETAM 100 MG/ML PO SOLN
500.0000 mg | Freq: Two times a day (BID) | ORAL | Status: DC
  Administered 2017-07-21 – 2017-07-28 (×13): 500 mg via ORAL
  Filled 2017-07-21 (×13): qty 5

## 2017-07-21 NOTE — Patient Care Conference (Signed)
Inpatient RehabilitationTeam Conference and Plan of Care Update Date: 07/21/2017   Time: 10:45 AM    Patient Name: Kelly Romero      Medical Record Number: [id number]  Date of Birth: 1965-11-27 Sex: Female         Room/Bed: 4W25C/4W25C-01 Payor Info: Payor: MEDICAID [place] / Plan: MEDICAID [place] ACCESS / Product Type: *No Product type* /    Admitting Diagnosis: CVA  Admit Date/Time:  07/13/2017  6:11 PM Admission Comments: No comment available   Primary Diagnosis:  <principal problem not specified> Principal Problem: <principal problem not specified>  Patient Active Problem List   Diagnosis Date Noted  . Hypoalbuminemia due to protein-calorie malnutrition (HCC)   . Orthostatic hypotension   . Poorly controlled type 2 diabetes mellitus with peripheral neuropathy (HCC)   . Emotional lability   . Thrombotic stroke (HCC) 07/13/2017  . Diabetes mellitus type 2 in obese (HCC)   . History of CVA (cerebrovascular accident)   . Benign essential HTN   . Dysphagia, post-stroke   . Acute blood loss anemia   . Morbid obesity (HCC)   . Right hemiparesis (HCC)   . Cryptogenic stroke (HCC) 07/08/2017  . Chest pain 11/06/2016  . 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 Aphasia as late effect of stroke 04/10/2016  . Encephalopathy   . Cerebral thrombosis with cerebral infarction 12/29/2015  . Hyperglycemia   . Hyperlipidemia   . Hypertensive emergency   . Deep venous thrombosis (HCC)   . AKI (acute kidney injury) (HCC) 12/26/2015  . Altered mental status   . Stroke-like symptoms   . Acute encephalopathy 08/17/2015  . Anxiety and depression   . CVA (cerebrovascular accident due to intracerebral hemorrhage) (HCC) 08/15/2015  . Stroke (HCC)   . Encephalopathy, metabolic 12/28/2014  . 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 Essential hypertension 12/28/2014  . New onset headache 11/02/2014    Expected  Discharge Date: Expected Discharge Date: 08/03/17  Team Members Present: Physician leading conference: Dr. Claudette Laws Social Worker Present: Dossie Der, LCSW Nurse Present: Other (comment)(Mia Wilson-RN) PT Present: Aleda Grana, PT OT Present: Johnsie Cancel, OT SLP Present: Colin Benton, SLP     Current Status/Progress Goal Weekly Team Focus  Medical   Orthostatic hypotension, BP meds were adjusted to treat the lying pressure  Reduce syncope and fall risk  ABD binder and TEDs, IV hydration   Bowel/Bladder   incont of bladder, cont of bowel  continent of bladder  timed toileting, use of bedside commode   Swallow/Nutrition/ Hydration   Dys 1 and downgrade to NTL  Min A  attempting MBS however medicall unstable, try agian at 9:00   ADL's   Min A UB bathing/dressing; min-mod A LB bathing/dressing; mod A toileting; limited participation last several days due to orthostatics  Supervision overall for ADLs  Upright tolerance and functional mobility, ADL re-training, neuro re-ed   Mobility   mod assist stair negotiation, min assist ambulation with or without RW, limited medically  supervision<>min assist overall  transfers, gait, stair negotiation, strengthening, balance, NMR   Communication   Max A word level intelligibilty and expressive aphasia Mod  Min A  speech intelligibility strategies at word level, utilzing getsures and multimodal to clarify message, word finding word level    Safety/Cognition/ Behavioral Observations  Max A  MIn A  semii-complex problem solving and selection attention    Pain   NA  Skin   mild MASD to abdomen folds  prevent skin infection/breakdown  assess skin qshift and apply barrier cream      *See Care Plan and progress notes for long and short-term goals.     Barriers to Discharge  Current Status/Progress Possible Resolutions Date Resolved   Physician    Inaccessible home environment;Medical stability;Lack of/limited family  support;Other (comments)  syncope   Cardiology eval tx for orthoBPs  Cont rehab      Nursing                  PT        medical issues, pt with impaired cognition           OT                  SLP                SW                Discharge Planning/Teaching Needs:  Home with daughter and son in-law who between them can provide 24 hr supervision, son in-law works third and needs to sleep some during the day      Team Discussion:  Goals supervision-min level-gait. Dys 1 needs MBS hopefully tomorrow. Medical issues with BP MD has restarted home meds. Ques need for AFO. Working in Psychologist, educational. Neuro-psych seeing for coping. Medical issues are limiting her in therapy participation. Daughter here today and getting questions answered.   Revisions to Treatment Plan:  DC 5/21    Continued Need for Acute Rehabilitation Level of Care: The patient requires daily medical management by a physician with specialized training in physical medicine and rehabilitation for the following conditions: Daily direction of a multidisciplinary physical rehabilitation program to ensure safe treatment while eliciting the highest outcome that is of practical value to the patient.: Yes Daily medical management of patient stability for increased activity during participation in an intensive rehabilitation regime.: Yes Daily analysis of laboratory values and/or radiology reports with any subsequent need for medication adjustment of medical intervention for : Cardiac problems;Mood/behavior problems;Neurological problems  [name] 07/22/2017, 2:13 PM

## 2017-07-21 NOTE — Progress Notes (Signed)
Recreational Therapy Session Note  Patient Details  Name: Kelly Romero MRN: [id number] Date of Birth: [birth year] Today's Date: 07/21/2017  Pt not yet appropriate for TR services, TR eval deferred.  Will continue to monitor through team for future participation. Winferd Wease 07/21/2017, 1:42 PM

## 2017-07-21 NOTE — Progress Notes (Signed)
Social Work Patient ID: Kelly Romero, female   DOB: 18-Apr-1965, 52 y.o.   MRN: [id number]  Met with pt and daughter who was here to talk with the MD regarding team conference goals supervision-min level and target discharge date 5/21. Hopefully now pt's BP getting more regulated will be able to participate in therapies and work on improving. Daughter reports someone will be there between she and her husband when comes home. Aware will need to come in for hands on training prior to discharge. Pt feels better today and is doing more in therapies. She feels neuro-psych is helping will have him see her again next week.

## 2017-07-21 NOTE — Progress Notes (Signed)
Speech Language Pathology Daily Session Note  Patient Details  Name: Kelly Romero MRN: [id number] Date of Birth: [birth year]  Today's Date: 07/21/2017 SLP Individual Time: 0900-1000 SLP Individual Time Calculation (min): 60 min  Short Term Goals: Week 1: SLP Short Term Goal 1 (Week 1): Pt will utilize word finding strategies to convey basic thoughts and ideas with Mod A cues.  SLP Short Term Goal 2 (Week 1): Pt will utilize speech intelligibility strategies with Mod A  to increase speech intelligibility to ~ 90% at the word level.  SLP Short Term Goal 3 (Week 1): Pt will solve semi-complex problem solving tasks with Mod A cues.  SLP Short Term Goal 4 (Week 1): Pt will demonstrate selective attention in moderately distracting environment for 30 minutes iwth Mod A cues.  SLP Short Term Goal 5 (Week 1): Pt will consume trials of dysphagia 2 with Mod A cues for use of compensatory swallow strategies to effectively clear oral residue.   Skilled Therapeutic Interventions:Skilled ST services focused on swallow and speech skills. SLP facilitated PO consumption of dys 1 and NTL, pt demonstrated no overt s/s aspiration, however reduce lingual manipulation and oral holding with minimum intake due to"texture." Daughter was present in session and stated pt was "picky eater" prior to admission. SLP collaborated with pt to order lunch/dinner and then breakfast for the following day based on food preference and SLP placed order. Pt required Max A verbal cues for speech intelligibility at word level and demonstrated mod-min impairment in expressive aphasia with awareness of errors, however Max A verbal cues to correct verbal errors. Pt was left in room with call bell within reach and daughter present. Reccomend to continue skilled ST services.      Function:  Eating Eating   Modified Consistency Diet: Yes Eating Assist Level: Supervision or verbal cues;More than reasonable amount of time            Cognition Comprehension Comprehension assist level: Understands basic 75 - 89% of the time/ requires cueing 10 - 24% of the time  Expression   Expression assist level: Expresses basic 25 - 49% of the time/requires cueing 50 - 75% of the time. Uses single words/gestures.;Expresses basic 50 - 74% of the time/requires cueing 25 - 49% of the time. Needs to repeat parts of sentences.  Social Interaction Social Interaction assist level: Interacts appropriately 50 - 74% of the time - May be physically or verbally inappropriate.  Problem Solving Problem solving assist level: Solves basic 50 - 74% of the time/requires cueing 25 - 49% of the time  Memory Memory assist level: Recognizes or recalls 50 - 74% of the time/requires cueing 25 - 49% of the time    Pain Pain Assessment Pain Score: 0-No pain  Therapy/Group: Individual Therapy  [name]  [facility] 07/21/2017, 12:58 PM

## 2017-07-21 NOTE — Progress Notes (Signed)
Occupational Therapy Weekly Progress Note  Patient Details  Name: Kelly Romero MRN: [id number] Date of Birth: 1965/04/08  Beginning of progress report period: Jul 14, 2017 End of progress report period: Jul 21, 2017  Today's Date: 07/21/2017 OT Individual Time: 0730-0830 OT Individual Time Calculation (min): 60 min    Patient has met 3 of 4 short term goals.  Pt is not oriented to day, month or year and is unable to carry over orientation information btwn sessions. Pt making slow but steady progress towards OT goals. She has been very limited in past ~5 days in therapy partcipation due to orthostatics, having 2 syncope episodes with this therapist within last 3 days. Days prior to orthostatic episodes, pt ambulating with HHA and providing steadying assist during ADLs and completing functional transfers with min A. Pt remains very motivated and is disappointed by her inability to fully participate in therapies.   Patient continues to demonstrate the following deficits: apraxia, ataxia, cognitive deficits, disturbance of vision, hemiplegia affecting dominant side and muscle weakness (generalized) and therefore will continue to benefit from skilled OT intervention to enhance overall performance with BADL and Reduce care partner burden.   Patient progressing toward long term goals..  Continue plan of care.  OT Short Term Goals Week 1:  OT Short Term Goal 1 (Week 1): Pt will maintain dynamic standing balance during toileting task with CGA OT Short Term Goal 1 - Progress (Week 1): Met OT Short Term Goal 2 (Week 1): Pt will complete LB dressing with steadying assist only OT Short Term Goal 2 - Progress (Week 1): Met OT Short Term Goal 3 (Week 1): Pt will use R UE during bathing task with min cuing for awareness OT Short Term Goal 3 - Progress (Week 1): Met OT Short Term Goal 4 (Week 1): Pt will consistently be oriented x4 with min questioning cues OT Short Term Goal 4 - Progress (Week 1): Not  met Week 2:  OT Short Term Goal 1 (Week 2): Pt will ambulate into bathroom with CGA using LRAD. OT Short Term Goal 2 (Week 2): Pt will complete 2 grooming tasks standing at sink with close supervision in order to increase functional activity tolerance OT Short Term Goal 3 (Week 2): Pt will complete 3/3 toileting tasks with guarding assist OT Short Term Goal 4 (Week 2): Pt will use R UE at stabilizer level mod I  Skilled Therapeutic Interventions/Progress Updates:    Pt seen for OT session ofcusing on upright toelrance and ADL re-training. Pt awakein supine upon arrival, HOB at 30 degrees. Pt agreeable to tx session and denying pain. Focus of beginning of session on upright tolerance, see BP results below:  Supine with HOB at 30 degrees: 206/82 EOB no TEDs no binder: 143/70  "little bit of dizziness" EOB TEDs and abdominal binder following ~5 minutes seated EOB: 141/71  Pt voiced desire to bathe though stated she did not feel comfortable to remain sitting EOB. Therefore, transitioned back into supine, assist for management of B LEs. Bathing/dressing completed at bed level, sitting with HOB raised. She was able to use R UE to assist with bed mobility on bed rails and during washing of UB. She was able to bridge in order for pants to be pulled up over hips.   BP assessed at end of session with HOB at 45 degrees and TED hose donned: 184/81   Pt left laying in bed at end of session with HOB at 45 degres in order to  work on upright tolerance, bed alarm on, call bell in reach. Education provided throughout session regarding BP and positional changes, functional implications, POC, and d/c planning.    Therapy Documentation Precautions:  Precautions Precautions: Fall Restrictions Weight Bearing Restrictions: No Pain:   No/denies pain  See Function Navigator for Current Functional Status.   Therapy/Group: Individual Therapy  Shantinique Picazo L 07/21/2017, 7:09 AM

## 2017-07-21 NOTE — Progress Notes (Signed)
Speech Language Pathology Daily Session Note  Patient Details  Name: Kelly Romero MRN: [id number] Date of Birth: Jul 26, 1965  Today's Date: 07/21/2017 SLP Individual Time: 4098-1191 SLP Individual Time Calculation (min): 52 min  Short Term Goals: Week 1: SLP Short Term Goal 1 (Week 1): Pt will utilize word finding strategies to convey basic thoughts and ideas with Mod A cues.  SLP Short Term Goal 2 (Week 1): Pt will utilize speech intelligibility strategies with Mod A  to increase speech intelligibility to ~ 90% at the word level.  SLP Short Term Goal 3 (Week 1): Pt will solve semi-complex problem solving tasks with Mod A cues.  SLP Short Term Goal 4 (Week 1): Pt will demonstrate selective attention in moderately distracting environment for 30 minutes iwth Mod A cues.  SLP Short Term Goal 5 (Week 1): Pt will consume trials of dysphagia 2 with Mod A cues for use of compensatory swallow strategies to effectively clear oral residue.   Skilled Therapeutic Interventions:   Pt was seen for skilled ST targeting goals for speech and swallowing.  SLP facilitated the session with trials of dys 2 textures given her refusal to consume more of her currently prescribed diet on lunch tray.  Pt needed min cues for portion control as well as to correct anterior labial loss of boluses.  No overt s/s of aspiration were evident with solids or nectar thickened liquids.  During conversations with SLP, pt needed mod assist verbal cues to overarticulate to achieve intelligibility at the word-phrase level.  Pt was left in bed with bed alarm set and call bell within reach.  Continue per current plan of care.    Function:  Eating Eating   Modified Consistency Diet: Yes Eating Assist Level: Supervision or verbal cues;Set up assist for   Eating Set Up Assist For: Opening containers       Cognition Comprehension Comprehension assist level: Understands basic 90% of the time/cues < 10% of the time  Expression    Expression assist level: Expresses basic 50 - 74% of the time/requires cueing 25 - 49% of the time. Needs to repeat parts of sentences.  Social Interaction Social Interaction assist level: Interacts appropriately 75 - 89% of the time - Needs redirection for appropriate language or to initiate interaction.  Problem Solving Problem solving assist level: Solves basic 50 - 74% of the time/requires cueing 25 - 49% of the time  Memory Memory assist level: Recognizes or recalls 50 - 74% of the time/requires cueing 25 - 49% of the time    Pain Pain Assessment Pain Scale: 0-10 Pain Score: 0-No pain  Therapy/Group: Individual Therapy  Stacy Sailer, Melanee Spry 07/21/2017, 2:13 PM

## 2017-07-21 NOTE — Plan of Care (Signed)
Resident continues to refuse meals and PO fluids. Education was given on the importance of consuming the proper nutrition to assist with recovery and healing.

## 2017-07-21 NOTE — Progress Notes (Signed)
Progress Note  Patient Name: Kelly Romero Date of Encounter: 07/21/2017  Primary Cardiologist: Nanetta Batty, MD   Subjective   52 yo with hx of CVA, admitted with syncope Was seen by Dr. Eldridge Dace yesterday .  Was found to be orthostatic.   Received iv saline overnight and is feeling better today   Echo - EF 60%. Mild MR ,  Loop recorder has been placed.   Inpatient Medications    Scheduled Meds: . aspirin EC  81 mg Oral Daily  . clopidogrel  75 mg Oral Daily  . DULoxetine  30 mg Oral Daily  . enoxaparin (LOVENOX) injection  40 mg Subcutaneous Q24H  . feeding supplement (PRO-STAT SUGAR FREE 64)  30 mL Oral BID  . insulin aspart  0-15 Units Subcutaneous TID AC & HS  . insulin detemir  20 Units Subcutaneous Daily  . levETIRAcetam  500 mg Oral BID  . lisinopril  10 mg Oral BID  . rosuvastatin  40 mg Oral QHS  . simethicone  80 mg Oral QID  . Valproate Sodium  500 mg Oral BID   Continuous Infusions: . sodium chloride 100 mL/hr at 07/20/17 1838   PRN Meds: acetaminophen, alum & mag hydroxide-simeth, bisacodyl, diphenhydrAMINE, guaiFENesin-dextromethorphan, polyethylene glycol, prochlorperazine **OR** prochlorperazine **OR** prochlorperazine, sodium phosphate   Vital Signs    Vitals:   07/20/17 1804 07/20/17 2240 07/21/17 0610 07/21/17 0612  BP:  (!) 181/75  (!) 185/79  Pulse:  81  85  Resp:  Temp:  97.8 F (36.6 C) 98.1 F (36.7 C)   TempSrc:  Oral Oral   SpO2:  96% 98% 98%  Weight: 210 lb 5.1 oz (95.4 kg)     Height:        Intake/Output Summary (Last 24 hours) at 07/21/2017 1336 Last data filed at 07/21/2017 1308 Gross per 24 hour  Intake 1496.67 ml  Output -  Net 1496.67 ml   Filed Weights   07/18/17 0221 07/19/17 0134 07/20/17 1804  Weight: 211 lb 13.8 oz (96.1 kg) 211 lb 13.8 oz (96.1 kg) 210 lb 5.1 oz (95.4 kg)    Telemetry     - Personally Reviewed  ECG      - Personally Reviewed  Physical Exam   GEN:  Middle age female,   Facial droop.  Slow to answer questions    Neck: No JVD Cardiac: RRR, no murmurs, rubs, or gallops.  Respiratory: Clear to auscultation bilaterally. GI: Soft, nontender, non-distended  MS: No edema; No deformity. Neuro:  Nonfocal  Psych: Normal affect   Labs    Chemistry Recent Labs  Lab 07/19/17 0944 07/20/17 0625  NA 140 141  K 3.7 3.9  CL 102 103  CO2 30 30  GLUCOSE 155* 108*  BUN 15 19  CREATININE 1.14* 1.14*  CALCIUM 9.3 9.2  PROT 6.1*  --   ALBUMIN 2.8*  --   AST 18  --   ALT 12*  --   ALKPHOS 60  --   BILITOT 0.4  --   GFRNONAA 54* 54*  GFRAA >60 >60  ANIONGAP 8 8     Hematology Recent Labs  Lab 07/19/17 0944 07/20/17 0625  WBC 4.7 4.3  RBC 3.63* 3.64*  HGB 10.2* 10.4*  HCT 32.8* 33.1*  MCV 90.4 90.9  MCH 28.1 28.6  MCHC 31.1 31.4  RDW 13.3 13.3  PLT 252 240    Cardiac EnzymesNo results for input(s): TROPONINI in the last 168 hours.  No results for input(s): TROPIPOC in the last 168 hours.   BNPNo results for input(s): BNP, PROBNP in the last 168 hours.   DDimer No results for input(s): DDIMER in the last 168 hours.   Radiology    Dg Abd 1 View  Result Date: 07/20/2017 CLINICAL DATA:  Left lower quadrant pain EXAM: ABDOMEN - 1 VIEW COMPARISON:  05/03/2014 FINDINGS: Postsurgical changes are noted in the anterior abdominal wall consistent with prior hernia repair. Changes of a previous cholecystectomy are seen as well. Scattered large and small bowel gas is noted. No bony abnormality is seen. IMPRESSION: No acute abnormality noted. Electronically Signed   By: Alcide Clever M.D.   On: 07/20/2017 10:50    Cardiac Studies     Patient Profile     52 y.o. female with syncope - evaluated by Dr .Hedy Jacob and was found to have orthostasis   Assessment & Plan    1.  Orthostatic hypotension:  She is working with speech therapy. She may not be getting enough PO intake  Her baseline BP remains elevated.   2. Essential HTN:  Will increase the Lisinopril to  20 mg BID . - following    For questions or updates, please contact [facility] Please consult [email or web address] for contact info under Cardiology/STEMI.      Signed, Kristeen Miss, MD  07/21/2017, 1:36 PM

## 2017-07-21 NOTE — Progress Notes (Signed)
Physical Therapy Weekly Progress Note  Patient Details  Name: Kelly Romero MRN: [id number] Date of Birth: 1965/09/13  Beginning of progress report period: Jul 14, 2017 End of progress report period: Jul 21, 2017  Today's Date: 07/21/2017 PT Individual Time: 5277-8242 PT Individual Time Calculation (min): 53 min   Patient has met 0 of 2 short term goals.  Pt is currently at a min assist level for transfers and ambulation with and without RW. Pt has been limited by medical issues (I.e. Low BP & questionable seizures). Pt would benefit from continued skilled PT to address R NMR, cognitive remediation, balance, transfers, bed mobility and gait.   Patient continues to demonstrate the following deficits muscle weakness, decreased cardiorespiratoy endurance, decreased coordination, decreased attention to right, decreased initiation, decreased attention, decreased awareness, decreased problem solving, decreased safety awareness, decreased memory and delayed processing, and decreased sitting balance, decreased standing balance, decreased postural control, hemiplegia and decreased balance strategies and therefore will continue to benefit from skilled PT intervention to increase functional independence with mobility.  Patient is slowly progressing towards LTGs.  Continue plan of care.  PT Short Term Goals Week 1:  PT Short Term Goal 1 (Week 1): Pt will ambulate 75 ft with LRAD & supervision. PT Short Term Goal 1 - Progress (Week 1): Not met PT Short Term Goal 2 (Week 1): Pt will negotiate 12 steps with B rails & mod assist for strengthening purposes. PT Short Term Goal 2 - Progress (Week 1): Not met Week 2:  PT Short Term Goal 1 (Week 2): Pt will ambulate 50 ft with LRAD & supervision. PT Short Term Goal 2 (Week 2): Pt will negotiate 12 steps with B rails and mod assist for strengthening purposes.  Skilled Therapeutic Interventions/Progress Updates:  RN consulted & reports pt just had syncopal  episode when transferring from [facility]>w/c and was assisted back to bed. RN cleared pt for participation in therapy. Pt received in bed & agreeable to tx. No c/o pain reported.  Pt reports need to have BM & assisted onto bedpan with mod assist for rolling with max cuing. Pt reports being finished but in fact did not have a BM or void. Pt scooted to [facility] with bed flat, multiple scoots, and extra time.  Pt agreeable to bed level exercises and performed the following: ankle pumps, heel slides, hip abduction slides, and hip adduction pillow squeezes. Pt requires max cuing for sustained attention to task to count repetitions of exercises. Therapist provides cuing for proper technique for exercises. Pt frequently falling asleep during exercises. BP = 176/76 mmHg (LUE, supine), HR = 80 bpm. Pt left in bed with all needs within reach & alarm set. Pt missed 22 minutes of skilled PT treatment 2/2 fatigue.   Therapy Documentation Precautions:  Precautions Precautions: Fall Restrictions Weight Bearing Restrictions: No   See Function Navigator for Current Functional Status.  Therapy/Group: Individual Therapy  Waunita Schooner 07/21/2017, 4:09 PM

## 2017-07-21 NOTE — Progress Notes (Signed)
Subjective/Complaints:  No dizziness .  Appreciate note from Cardiology  ROS-  Limited due to dysarthria and cognition, denies CP, SOB, vomiting, diarrhea.  Objective: Vital Signs: Blood pressure (!) 185/79, pulse 85, temperature (P) 98.1 F (36.7 C), temperature source (P) Oral, resp. rate 17, height '5\' 5"'$  (1.651 m), weight 95.4 kg (210 lb 5.1 oz), SpO2 98 %. Dg Abd 1 View  Result Date: 07/20/2017 CLINICAL DATA:  Left lower quadrant pain EXAM: ABDOMEN - 1 VIEW COMPARISON:  05/03/2014 FINDINGS: Postsurgical changes are noted in the anterior abdominal wall consistent with prior hernia repair. Changes of a previous cholecystectomy are seen as well. Scattered large and small bowel gas is noted. No bony abnormality is seen. IMPRESSION: No acute abnormality noted. Electronically Signed   By: Inez Catalina M.D.   On: 07/20/2017 10:50   Ct Head Wo Contrast  Result Date: 07/19/2017 CLINICAL DATA:  Possible seizure today. EXAM: CT HEAD WITHOUT CONTRAST TECHNIQUE: Contiguous axial images were obtained from the base of the skull through the vertex without intravenous contrast. COMPARISON:  Brain MRI 07/11/2017.  Head CT scan 07/08/2017. FINDINGS: Brain: No evidence of acute infarction, hemorrhage, hydrocephalus, extra-axial collection or mass lesion/mass effect. Atrophy, chronic microvascular ischemic change and remote left PCA territory infarct are seen as on the prior exams. Vascular: Atherosclerosis noted. Skull: Intact. Sinuses/Orbits: Status post bilateral lens extraction. Paranasal sinuses and mastoid air cells are clear. Other: None. IMPRESSION: No acute abnormality. Chronic microvascular ischemic change and remote left PCA infarct. Atherosclerosis. Electronically Signed   By: Inge Rise M.D.   On: 07/19/2017 12:28   Results for orders placed or performed during the hospital encounter of 07/13/17 (from the past 72 hour(s))  Glucose, capillary     Status: Abnormal   Collection Time: 07/18/17  12:09 PM  Result Value Ref Range   Glucose-Capillary 132 (H) 65 - 99 mg/dL  Glucose, capillary     Status: Abnormal   Collection Time: 07/18/17  4:31 PM  Result Value Ref Range   Glucose-Capillary 140 (H) 65 - 99 mg/dL  Glucose, capillary     Status: Abnormal   Collection Time: 07/18/17  8:58 PM  Result Value Ref Range   Glucose-Capillary 116 (H) 65 - 99 mg/dL   Comment 1 Notify RN   Glucose, capillary     Status: None   Collection Time: 07/19/17  6:38 AM  Result Value Ref Range   Glucose-Capillary 96 65 - 99 mg/dL   Comment 1 Notify RN   Glucose, capillary     Status: Abnormal   Collection Time: 07/19/17  8:06 AM  Result Value Ref Range   Glucose-Capillary 132 (H) 65 - 99 mg/dL  Valproic acid level     Status: None   Collection Time: 07/19/17  8:19 AM  Result Value Ref Range   Valproic Acid Lvl 53 50.0 - 100.0 ug/mL    Comment: Performed at [facility], [place] [address] [place], [name] [address]  Comprehensive metabolic panel     Status: Abnormal   Collection Time: 07/19/17  9:44 AM  Result Value Ref Range   Sodium 140 135 - 145 mmol/L   Potassium 3.7 3.5 - 5.1 mmol/L   Chloride 102 101 - 111 mmol/L   CO2 30 22 - 32 mmol/L   Glucose, Bld 155 (H) 65 - 99 mg/dL   BUN 15 6 - 20 mg/dL   Creatinine, Ser 1.14 (H) 0.44 - 1.00 mg/dL   Calcium 9.3 8.9 - 10.3 mg/dL  Total Protein 6.1 (L) 6.5 - 8.1 g/dL   Albumin 2.8 (L) 3.5 - 5.0 g/dL   AST 18 15 - 41 U/L   ALT 12 (L) 14 - 54 U/L   Alkaline Phosphatase 60 38 - 126 U/L   Total Bilirubin 0.4 0.3 - 1.2 mg/dL   GFR calc non Af Amer 54 (L) >60 mL/min   GFR calc Af Amer >60 >60 mL/min    Comment: (NOTE) The eGFR has been calculated using the CKD EPI equation. This calculation has not been validated in all clinical situations. eGFR's persistently <60 mL/min signify possible Chronic Kidney Disease.    Anion gap 8 5 - 15    Comment: Performed at [facility] [address] [place], [place] [address]  CBC with  Differential/Platelet     Status: Abnormal   Collection Time: 07/19/17  9:44 AM  Result Value Ref Range   WBC 4.7 4.0 - 10.5 K/uL   RBC 3.63 (L) 3.87 - 5.11 MIL/uL   Hemoglobin 10.2 (L) 12.0 - 15.0 g/dL   HCT 32.8 (L) 36.0 - 46.0 %   MCV 90.4 78.0 - 100.0 fL   MCH 28.1 26.0 - 34.0 pg   MCHC 31.1 30.0 - 36.0 g/dL   RDW 13.3 11.5 - 15.5 %   Platelets 252 150 - 400 K/uL   Neutrophils Relative % 71 %   Neutro Abs 3.3 1.7 - 7.7 K/uL   Lymphocytes Relative 21 %   Lymphs Abs 1.0 0.7 - 4.0 K/uL   Monocytes Relative 7 %   Monocytes Absolute 0.3 0.1 - 1.0 K/uL   Eosinophils Relative 1 %   Eosinophils Absolute 0.1 0.0 - 0.7 K/uL   Basophils Relative 0 %   Basophils Absolute 0.0 0.0 - 0.1 K/uL    Comment: Performed at [facility] [address], [place], [place] [address]  Glucose, capillary     Status: Abnormal   Collection Time: 07/19/17 11:50 AM  Result Value Ref Range   Glucose-Capillary 150 (H) 65 - 99 mg/dL  Glucose, capillary     Status: Abnormal   Collection Time: 07/19/17  4:39 PM  Result Value Ref Range   Glucose-Capillary 102 (H) 65 - 99 mg/dL  Glucose, capillary     Status: Abnormal   Collection Time: 07/19/17 10:16 PM  Result Value Ref Range   Glucose-Capillary 120 (H) 65 - 99 mg/dL  Basic metabolic panel     Status: Abnormal   Collection Time: 07/20/17  6:25 AM  Result Value Ref Range   Sodium 141 135 - 145 mmol/L   Potassium 3.9 3.5 - 5.1 mmol/L   Chloride 103 101 - 111 mmol/L   CO2 30 22 - 32 mmol/L   Glucose, Bld 108 (H) 65 - 99 mg/dL   BUN 19 6 - 20 mg/dL   Creatinine, Ser 1.14 (H) 0.44 - 1.00 mg/dL   Calcium 9.2 8.9 - 10.3 mg/dL   GFR calc non Af Amer 54 (L) >60 mL/min   GFR calc Af Amer >60 >60 mL/min    Comment: (NOTE) The eGFR has been calculated using the CKD EPI equation. This calculation has not been validated in all clinical situations. eGFR's persistently <60 mL/min signify possible Chronic Kidney Disease.    Anion gap 8 5 - 15    Comment:  Performed at [facility] [address], [place], [place] [address]  CBC     Status: Abnormal   Collection Time: 07/20/17  6:25 AM  Result Value Ref Range   WBC 4.3 4.0 - 10.5 K/uL   RBC 3.64 (L) 3.87 - 5.11 MIL/uL   Hemoglobin 10.4 (L) 12.0 - 15.0 g/dL   HCT 33.1 (L) 36.0 - 46.0 %   MCV 90.9 78.0 - 100.0 fL   MCH 28.6 26.0 - 34.0 pg   MCHC 31.4 30.0 - 36.0 g/dL   RDW 13.3 11.5 - 15.5 %   Platelets 240 150 - 400 K/uL    Comment: Performed at [facility] [address], [place], [place] [address]  Glucose, capillary     Status: None   Collection Time: 07/20/17  7:03 AM  Result Value Ref Range   Glucose-Capillary 94 65 - 99 mg/dL  Ammonia     Status: None   Collection Time: 07/20/17  9:24 AM  Result Value Ref Range   Ammonia 14 9 - 35 umol/L    Comment: Performed at [facility], [place] [address], [place], [place] [address]  Glucose, capillary     Status: Abnormal   Collection Time: 07/20/17 11:44 AM  Result Value Ref Range   Glucose-Capillary 139 (H) 65 - 99 mg/dL  Glucose, capillary     Status: None   Collection Time: 07/20/17  5:09 PM  Result Value Ref Range   Glucose-Capillary 89 65 - 99 mg/dL  Glucose, capillary     Status: None   Collection Time: 07/20/17 10:38 PM  Result Value Ref Range   Glucose-Capillary 71 65 - 99 mg/dL  Glucose, capillary     Status: Abnormal   Collection Time: 07/21/17 12:27 AM  Result Value Ref Range   Glucose-Capillary 106 (H) 65 - 99 mg/dL  Glucose, capillary     Status: None   Collection Time: 07/21/17  6:16 AM  Result Value Ref Range   Glucose-Capillary 84 65 - 99 mg/dL     Constitutional: No distress . Vital signs reviewed. HENT: Normocephalic.  Atraumatic. Eyes: EOMI. No discharge. Cardiovascular: RRR. No JVD. Respiratory: CTA Bilaterally. Normal effort. GI: BS +. Non-distended.Mild lower abd tenderness Musc: No edema or tenderness in extremities. Skin:   Intact. Warm and dry. Neuro:  Alert Right facial  droop Alert/Oriented to hsop but Oval Linsey, oriented to person not time Motor: 4/5 RUE proximal to distal- stable 4+/5 LUE proximal to distal- stable RLE: 3-/5 HF, KE, ADF, 4/5 (stable) LLE: 3-/5 HF, KE, ADF stable Dysarthria Psych: Very flat.  Assessment/Plan: 1. 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 Physiatrist is providing close team supervision and 24 hour management of active medical problems listed below. Physiatrist and rehab team continue to assess barriers to discharge/monitor patient progress toward functional and medical goals. FIM: Function - Bathing Bathing activity did not occur: Refused Position: Shower Body parts bathed by patient: Chest, Abdomen, Front perineal area, Right upper leg, Left upper leg Body parts bathed by helper: Left arm, Right lower leg, Left lower leg, Back, Buttocks, Right arm  Function- Upper Body Dressing/Undressing What is the patient wearing?: Pull over shirt/dress Pull over shirt/dress - Perfomed by patient: Thread/unthread left sleeve, Put head through opening, Thread/unthread right sleeve, Pull shirt over trunk Pull over shirt/dress - Perfomed by helper: Thread/unthread right sleeve, Pull shirt over trunk Assist Level: Supervision or verbal cues Function - Lower Body Dressing/Undressing What is  the patient wearing?: Pants Position: Sitting EOB Pants- Performed by patient: Thread/unthread right pants leg, Thread/unthread left pants leg Pants- Performed by helper: Pull pants up/down Non-skid slipper socks- Performed by helper: Don/doff right sock, Don/doff left sock Socks - Performed by helper: Don/doff right sock, Don/doff left sock Shoes - Performed by patient: Don/doff right shoe, Fasten right Shoes -  Performed by helper: Don/doff left shoe, Fasten left TED Hose - Performed by helper: Don/doff right TED hose, Don/doff left TED hose Assist for footwear: Partial/moderate assist Assist for lower body dressing: Touching or steadying assistance (Pt > 75%)  Function - Toileting Toileting steps completed by helper: Adjust clothing prior to toileting, Performs perineal hygiene, Adjust clothing after toileting Toileting Assistive Devices: Grab bar or rail Assist level: Touching or steadying assistance (Pt.75%)  Function - Air cabin crew transfer assistive device: Grab bar Assist level to toilet: Moderate assist (Pt 50 - 74%/lift or lower) Assist level from toilet: Moderate assist (Pt 50 - 74%/lift or lower) Assist level to bedside commode (at bedside): Moderate assist (Pt 50 - 74%/lift or lower)(per Leann Nickles-Wright, NT) Assist level from bedside commode (at bedside): Moderate assist (Pt 50 - 74%/lift or lower)  Function - Chair/bed transfer Chair/bed transfer method: Ambulatory Chair/bed transfer assist level: Touching or steadying assistance (Pt > 75%) Chair/bed transfer assistive device: Bedrails, Armrests  Function - Locomotion: Wheelchair Will patient use wheelchair at discharge?: No Wheelchair activity did not occur: Safety/medical concerns Wheel 50 feet with 2 turns activity did not occur: Safety/medical concerns Wheel 150 feet activity did not occur: Safety/medical concerns Function - Locomotion: Ambulation Assistive device: No device Max distance: 100' Assist level: Touching or steadying assistance (Pt > 75%) Assist level: Touching or steadying assistance (Pt > 75%) Assist level: Touching or steadying assistance (Pt > 75%) Walk 150 feet activity did not occur: Safety/medical concerns Assist level: Moderate assist (Pt 50 - 74%) Assist level: Moderate assist (Pt 50 - 74%)  Function - Comprehension Comprehension: Auditory Comprehension assist level: Understands  basic 75 - 89% of the time/ requires cueing 10 - 24% of the time  Function - Expression Expression: Verbal Expression assist level: Expresses basic 50 - 74% of the time/requires cueing 25 - 49% of the time. Needs to repeat parts of sentences.  Function - Social Interaction Social Interaction assist level: Interacts appropriately 50 - 74% of the time - May be physically or verbally inappropriate.  Function - Problem Solving Problem solving assist level: Solves basic 50 - 74% of the time/requires cueing 25 - 49% of the time  Function - Memory Memory assist level: Recognizes or recalls 50 - 74% of the time/requires cueing 25 - 49% of the time Patient normally able to recall (first 3 days only): Current season, That he or she is in a hospital, Location of own room   Medical Problem List and Plan:  1. Right hemiparesis and functional deficits secondary to left periventricular white matter and right frontal lobe infarcts    Cont CIR - may resume full therapy Team conference today please see physician documentation under team conference tab, met with team face-to-face to discuss problems,progress, and goals. Formulized individual treatment plan based on medical history, underlying problem and comorbidities. 2. DVT Prophylaxis/Anticoagulation: Pharmaceutical: Lovenox '40mg'$  q 24h 3. Chronic HA/Pain Management: Depakote bid with tylenol prn.  Lower extremity paresthesias  Trial Cymbalta 4. Mood: LCSW to follow for evaluation and support.    Emotional lability trial of Cymbalta.    If this is not helpful may  consider SSRI 5. Neuropsych: This patient is not fully capable of making decisions on her own behalf.  6. Skin/Wound Care: routine pressure relief measures.  7. Fluids/Electrolytes/Nutrition: Monitor I/Os  8. 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 Monitor BS ac/hs. Educate on importance of compliance.   CBG (last 3)  Recent Labs    07/20/17 2238 07/21/17 0027 07/21/17 0616  GLUCAP 71 106* 84    controlled on 5/7 9. Dyslipidemia: On Crestor.  10. Anemia: Hgb stable at 9.5 on 5/1 11. HTN with orthostasis- will check q shift, syncopal episode today Monitor BP bid. Reduce lisinopril Vitals:   07/21/17 0610 07/21/17 0612  BP:  (!) 185/79  Pulse:  85  Resp: (P) 18 17  Temp: (P) 98.1 F (36.7 C)   SpO2: (P) 98% 98%   Ordered TEDs  Abdominal binder    Continue to monitor- IVF per Cardiology 12. Hypoalbuminemia  Supplement initiated on 5/4 13.  AMS probable seizure,CT neg forhemmorhagic transformation, check EEG On Depakene, reviewed Neuro outpt note, possible seizure in 2017 , has also been treated for migraines,check valproate level stat may need increased dose of depakene 14.  Mild abd tenderness  KUB neg LOS (Days) 8 A FACE TO FACE EVALUATION WAS PERFORMED  Charlett Blake 07/21/2017, 9:29 AM

## 2017-07-21 NOTE — Progress Notes (Signed)
Speech Language Pathology Weekly Progress and Session Note  Patient Details  Name: Kelly Romero MRN: [id number] Date of Birth: 09/18/1965  Beginning of progress report period: Jul 14, 2017 End of progress report period: Jul 21, 2017   Short Term Goals: Week 1: SLP Short Term Goal 1 (Week 1): Pt will utilize word finding strategies to convey basic thoughts and ideas with Mod A cues.  SLP Short Term Goal 1 - Progress (Week 1): Met SLP Short Term Goal 2 (Week 1): Pt will utilize speech intelligibility strategies with Mod A  to increase speech intelligibility to ~ 90% at the word level.  SLP Short Term Goal 2 - Progress (Week 1): Met SLP Short Term Goal 3 (Week 1): Pt will solve semi-complex problem solving tasks with Mod A cues.  SLP Short Term Goal 3 - Progress (Week 1): Progressing toward goal SLP Short Term Goal 4 (Week 1): Pt will demonstrate selective attention in moderately distracting environment for 30 minutes iwth Mod A cues.  SLP Short Term Goal 4 - Progress (Week 1): Progressing toward goal SLP Short Term Goal 5 (Week 1): Pt will consume trials of dysphagia 2 with Mod A cues for use of compensatory swallow strategies to effectively clear oral residue.  SLP Short Term Goal 5 - Progress (Week 1): Met    New Short Term Goals: Week 2: SLP Short Term Goal 1 (Week 2): Pt will utilize word finding strategies to convey basic thoughts and ideas with Min A cues.  SLP Short Term Goal 2 (Week 2): Pt will utilize speech intelligibility strategies with Min A  to increase speech intelligibility to ~ 90% at the word level.  SLP Short Term Goal 3 (Week 2): Pt will solve semi-complex problem solving tasks with Mod A cues.  SLP Short Term Goal 4 (Week 2): Pt will demonstrate selective attention in moderately distracting environment for 30 minutes iwth Mod A cues.  SLP Short Term Goal 5 (Week 2): Pt will consume trials of dysphagia 2 with Min A cues for use of compensatory swallow strategies to  effectively clear oral residue.   Weekly Progress Updates:  Pt has made slow, limited gains this reporting period secondary to medical issues.  Pt has met 3 out of 5 short term goals and is currently mod-max assist for functional tasks due to moderately severe cognitive-linguistic deficits.  Pt is consuming dys 1 textures and nectar thick liquids with mod cues for use of swallowing precautions.  Given the abovementioned deficits, pt would benefit from skilled ST while inpatient in order to maximize functional independence and reduce burden of care.  Anticipate that pt will need 24/7 supervision at discharge in addition to [place] follow up at next level of care.     Intensity: Minumum of 1-2 x/day, 30 to 90 minutes Frequency: 3 to 5 out of 7 days Duration/Length of Stay: 2.5 to 3 weeks Treatment/Interventions: Cognitive remediation/compensation;Cueing hierarchy;Dysphagia/aspiration precaution training;Functional tasks;Patient/family education;Therapeutic Activities    Therapy/Group: Individual Therapy  Magnum Lunde, Selinda Orion 07/21/2017, 2:18 PM
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
## 2017-07-22 ENCOUNTER — Inpatient Hospital Stay (HOSPITAL_COMMUNITY): Payer: Medicaid Other

## 2017-07-22 ENCOUNTER — Inpatient Hospital Stay (HOSPITAL_COMMUNITY): Payer: Medicaid Other | Admitting: Physical Therapy

## 2017-07-22 ENCOUNTER — Inpatient Hospital Stay (HOSPITAL_COMMUNITY): Payer: Medicaid Other | Admitting: Speech Pathology

## 2017-07-22 ENCOUNTER — Inpatient Hospital Stay (HOSPITAL_COMMUNITY): Payer: Medicaid Other | Admitting: Occupational Therapy

## 2017-07-22 ENCOUNTER — Encounter (HOSPITAL_COMMUNITY): Payer: Medicaid Other | Admitting: Speech Pathology

## 2017-07-22 LAB — GLUCOSE, CAPILLARY
GLUCOSE-CAPILLARY: 110 mg/dL — AB (ref 65–99)
GLUCOSE-CAPILLARY: 119 mg/dL — AB (ref 65–99)
GLUCOSE-CAPILLARY: 151 mg/dL — AB (ref 65–99)
Glucose-Capillary: 110 mg/dL — ABNORMAL HIGH (ref 65–99)

## 2017-07-22 MED ORDER — FLEET ENEMA 7-19 GM/118ML RE ENEM: 1.0000 | ENEMA | Freq: Every day | RECTAL | Status: DC | PRN

## 2017-07-22 MED ORDER — SODIUM CHLORIDE 0.9 % IV SOLN
INTRAVENOUS | Status: DC
  Administered 2017-07-22 – 2017-07-30 (×9): via INTRAVENOUS
  Filled 2017-07-22 (×15): qty 1000

## 2017-07-22 NOTE — Progress Notes (Signed)
Occupational Therapy Session Note  Patient Details  Name: Kelly Romero MRN: [id number] Date of Birth: 1965-10-19  Today's Date: 07/22/2017 OT Individual Time: 4098-1191 OT Individual Time Calculation (min): 60 min    Short Term Goals: Week 2:  OT Short Term Goal 1 (Week 2): Pt will ambulate into bathroom with CGA using LRAD. OT Short Term Goal 2 (Week 2): Pt will complete 2 grooming tasks standing at sink with close supervision in order to increase functional activity tolerance OT Short Term Goal 3 (Week 2): Pt will complete 3/3 toileting tasks with guarding assist OT Short Term Goal 4 (Week 2): Pt will use R UE at stabilizer level mod I  Skilled Therapeutic Interventions/Progress Updates:    Pt seen for OT ADL bathing/dressing session. Pt awake in supine upon arrival, HOB raised, pt agreeable to tx session and denying pain.  BP in supine: 161/70 BP EOB: 152/63  Completed stand pivot transfers throughout session with mod A overall and verbal/visual cuing for hand placement.  She bathed seated on tub bench, able to manage hand held shower head with R UE, attempted to bathe with R UE, however, due to weakness was unable to cleanse thoroughly with R UE and therefore cued to go back through for thoroughness with L UE. When seated on tub bench as well as w/c, pt with R lean with static and dynamic sitting balance, difficult to self correct despite multimodal cuing. Pt dressed seated in w/c, appearing more distracted and decreased problem solving abilities to complete dressing task when R UE was not threaded entirely through shirt. When standing to pull pants up, pt voiced increased dizzyness, TED hose were already donned, returned to sitting and BP 144/110. +2 assist retrieved for safety to complete clothing management and transfer to bed as pt at baseline slow to respond to questions and not accurate and describing orthostatic symptoms. Pt left in supine at end of session, HOB raised with bed  alarm on.  Therapy Documentation Precautions:  Precautions Precautions: Fall Restrictions Weight Bearing Restrictions: No  See Function Navigator for Current Functional Status.   Therapy/Group: Individual Therapy  Ayron Fillinger L 07/22/2017, 6:52 AM

## 2017-07-22 NOTE — Progress Notes (Signed)
Physical Therapy Note  Patient Details  Name: Kelly Romero MRN: [id number] Date of Birth: 04-03-1965 Today's Date: 07/22/2017    Pt refused 60 minutes skilled PT due to c/o fatigue and "I don't feel like it".  PT will attempt later as appropriate   Sundy Houchins 07/22/2017, 1:34 PM

## 2017-07-22 NOTE — Progress Notes (Signed)
Modified Barium Swallow Progress Note  Patient Details  Name: Kelly Romero MRN: [id number] Date of Birth: September 05, 1965  Today's Date: 07/22/2017  Modified Barium Swallow completed.  Full report located under Chart Review in the Imaging Section.  Brief recommendations include the following:  Clinical Impression  Of note, this is pt's first instrumental study d/t intermittent coughing at bedside with thin liquids. Pt presents with severe oral phase dysphagia that is further completed by moderate cognitive deficits. Pt's oral phase is c/b no lingual movement, tongue pumping and multiple attempts to transfer bolus posteriorally. Despite Total A multimodal cues as well as right head turn and supraglottic swallow, pt is not able to decrease oral phase or increase timeliness of swallow initiation. Chin tuck not attempted d/t no ability to transfer bolus posteriorally when head in neutral alignment.  Pt is able to orally contain nectar thick liquids and honey thick liquids, however with nectar thick liquids pt's delayed swallow results in silent deep penetration to cords with residue eventually being silently aspirated. Pt is not able to orally contain thin liquids which results in premature spillage of bolus and moderate silent aspiration. Pt with delayed swallow initiation to valleculae with puree and honey thick liquids but no penetration or aspriation observed with these textures. At this time pt's is at a very high risk of aspiration. Given history of multiple CVAs, decreased recent progress, moderate silent aspiraiton, moderate cognitive deficits, significantly decreased vocal intensity and significantly weak cough, I am recommend puree diet with honey thick liquids, medicince crushed in puree and recommend long term alternative means of nutrition such as PEG.    Swallow Evaluation Recommendations       SLP Diet Recommendations: Alternative means - long-term;Dysphagia 1 (Puree) solids;Honey thick  liquids   Liquid Administration via: Cup;Spoon   Medication Administration: Whole meds with puree   Supervision: Patient able to self feed;Full supervision/cueing for compensatory strategies   Compensations: Minimize environmental distractions;Lingual sweep for clearance of pocketing;Slow rate;Small sips/bites   Postural Changes: Seated upright at 90 degrees   Oral Care Recommendations: Oral care BID   Other Recommendations: Order thickener from pharmacy;Prohibited food (jello, ice cream, thin soups);Have oral suction available;Remove water pitcher    Algis Lehenbauer 07/22/2017,12:03 PM

## 2017-07-22 NOTE — Progress Notes (Signed)
Speech Language Pathology Daily Session Note  Patient Details  Name: Kelly Romero MRN: [id number] Date of Birth: 14-Feb-1966  Today's Date: 07/22/2017 SLP Individual Time: 1420-1500 SLP Individual Time Calculation (min): 40 min  Short Term Goals: Week 2: SLP Short Term Goal 1 (Week 2): Pt will utilize word finding strategies to convey basic thoughts and ideas with Min A cues.  SLP Short Term Goal 2 (Week 2): Pt will utilize speech intelligibility strategies with Min A  to increase speech intelligibility to ~ 90% at the word level.  SLP Short Term Goal 3 (Week 2): Pt will solve semi-complex problem solving tasks with Mod A cues.  SLP Short Term Goal 4 (Week 2): Pt will demonstrate selective attention in moderately distracting environment for 30 minutes iwth Mod A cues.  SLP Short Term Goal 5 (Week 2): Pt will consume puree with honey thick liquids and minimal s/s of aspiration and Mod A for useof compensatory swallow strategies.  SLP Short Term Goal 5 - Progress (Week 2): Revised due to lack of progress  Skilled Therapeutic Interventions: Skilled treatment session focused on dysphagia and speech goals. Patient initially declined a snack of Dys. 1 textures, however, once the context of a snack was enhanced and appeared more social (glass bowl, metal spoon, conversation) patient consumed ~75% of a magic cup. Patient required extra time but initiated a swallow with Mod I without overt s/s of aspiration. Patient also participated in a functional conversation that focused on results of most recent MBS and reason for current diet and required Min-Mod A verbal cues for use of speech intelligibility strategies to achieve ~80% intelligibility at the phrase and sentence level. Patient left upright in bed with alarm on and all needs within reach. Continue with current plan of care.      Function:  Eating Eating   Modified Consistency Diet: Yes Eating Assist Level: Supervision or verbal cues;Set up  assist for           Cognition Comprehension Comprehension assist level: Follows basic conversation/direction with extra time/assistive device;Follows basic conversation/direction with no assist  Expression   Expression assist level: Expresses basis less than 25% of the time/requires cueing >75% of the time.  Social Interaction Social Interaction assist level: Interacts appropriately 50 - 74% of the time - May be physically or verbally inappropriate.  Problem Solving Problem solving assist level: Solves basic 50 - 74% of the time/requires cueing 25 - 49% of the time  Memory Memory assist level: Recognizes or recalls 50 - 74% of the time/requires cueing 25 - 49% of the time    Pain No/Denies Pain   Therapy/Group: Individual Therapy  Ashlay Altieri 07/22/2017, 4:11 PM

## 2017-07-22 NOTE — Progress Notes (Signed)
Subjective/Complaints:  Reviewed ortho vitals, last set no drop but marked increase with standing, ? If these were recorded properly Appreciate Neuropsych note Felt dizzy in shower but no syncope  ROS-  Limited due to dysarthria and cognition, denies CP, SOB, vomiting, diarrhea.  Objective: Vital Signs: Blood pressure (!) 141/68, pulse 79, temperature 98.5 F (36.9 C), temperature source Oral, resp. rate 18, height _0  (1.651 m), weight 95.4 kg (210 lb 5.1 oz), SpO2 99 %. Dg Abd 1 View  Result Date: 07/20/2017 CLINICAL DATA:  Left lower quadrant pain EXAM: ABDOMEN - 1 VIEW COMPARISON:  05/03/2014 FINDINGS: Postsurgical changes are noted in the anterior abdominal wall consistent with prior hernia repair. Changes of a previous cholecystectomy are seen as well. Scattered large and small bowel gas is noted. No bony abnormality is seen. IMPRESSION: No acute abnormality noted. Electronically Signed   By: Inez Catalina M.D.   On: 07/20/2017 10:50   Results for orders placed or performed during the hospital encounter of 07/13/17 (from the past 72 hour(s))  Comprehensive metabolic panel     Status: Abnormal   Collection Time: 07/19/17  9:44 AM  Result Value Ref Range   Sodium 140 135 - 145 mmol/L   Potassium 3.7 3.5 - 5.1 mmol/L   Chloride 102 101 - 111 mmol/L   CO2 30 22 - 32 mmol/L   Glucose, Bld 155 (H) 65 - 99 mg/dL   BUN 15 6 - 20 mg/dL   Creatinine, Ser 1.14 (H) 0.44 - 1.00 mg/dL   Calcium 9.3 8.9 - 10.3 mg/dL   Total Protein 6.1 (L) 6.5 - 8.1 g/dL   Albumin 2.8 (L) 3.5 - 5.0 g/dL   AST 18 15 - 41 U/L   ALT 12 (L) 14 - 54 U/L   Alkaline Phosphatase 60 38 - 126 U/L   Total Bilirubin 0.4 0.3 - 1.2 mg/dL   GFR calc non Af Amer 54 (L) >60 mL/min   GFR calc Af Amer >60 >60 mL/min    Comment: (NOTE) The eGFR has been calculated using the CKD EPI equation. This calculation has not been validated in all clinical situations. eGFR's persistently <60 mL/min signify possible Chronic  Kidney Disease.    Anion gap 8 5 - 15    Comment: Performed at [facility] [address] [place], [place] [address]  CBC with Differential/Platelet     Status: Abnormal   Collection Time: 07/19/17  9:44 AM  Result Value Ref Range   WBC 4.7 4.0 - 10.5 K/uL   RBC 3.63 (L) 3.87 - 5.11 MIL/uL   Hemoglobin 10.2 (L) 12.0 - 15.0 g/dL   HCT 32.8 (L) 36.0 - 46.0 %   MCV 90.4 78.0 - 100.0 fL   MCH 28.1 26.0 - 34.0 pg   MCHC 31.1 30.0 - 36.0 g/dL   RDW 13.3 11.5 - 15.5 %   Platelets 252 150 - 400 K/uL   Neutrophils Relative % 71 %   Neutro Abs 3.3 1.7 - 7.7 K/uL   Lymphocytes Relative 21 %   Lymphs Abs 1.0 0.7 - 4.0 K/uL   Monocytes Relative 7 %   Monocytes Absolute 0.3 0.1 - 1.0 K/uL   Eosinophils Relative 1 %   Eosinophils Absolute 0.1 0.0 - 0.7 K/uL   Basophils Relative 0 %   Basophils Absolute 0.0 0.0 - 0.1 K/uL    Comment: Performed at [facility] [address], [place], [place] [address]  Glucose, capillary  Status: Abnormal   Collection Time: 07/19/17 11:50 AM  Result Value Ref Range   Glucose-Capillary 150 (H) 65 - 99 mg/dL  Glucose, capillary     Status: Abnormal   Collection Time: 07/19/17  4:39 PM  Result Value Ref Range   Glucose-Capillary 102 (H) 65 - 99 mg/dL  Glucose, capillary     Status: Abnormal   Collection Time: 07/19/17 10:16 PM  Result Value Ref Range   Glucose-Capillary 120 (H) 65 - 99 mg/dL  Basic metabolic panel     Status: Abnormal   Collection Time: 07/20/17  6:25 AM  Result Value Ref Range   Sodium 141 135 - 145 mmol/L   Potassium 3.9 3.5 - 5.1 mmol/L   Chloride 103 101 - 111 mmol/L   CO2 30 22 - 32 mmol/L   Glucose, Bld 108 (H) 65 - 99 mg/dL   BUN 19 6 - 20 mg/dL   Creatinine, Ser 1.14 (H) 0.44 - 1.00 mg/dL   Calcium 9.2 8.9 - 10.3 mg/dL   GFR calc non Af Amer 54 (L) >60 mL/min   GFR calc Af Amer >60 >60 mL/min    Comment: (NOTE) The eGFR has been calculated using the CKD EPI equation. This calculation has not been  validated in all clinical situations. eGFR's persistently <60 mL/min signify possible Chronic Kidney Disease.    Anion gap 8 5 - 15    Comment: Performed at [facility] [address] [place], [place] [address]  CBC     Status: Abnormal   Collection Time: 07/20/17  6:25 AM  Result Value Ref Range   WBC 4.3 4.0 - 10.5 K/uL   RBC 3.64 (L) 3.87 - 5.11 MIL/uL   Hemoglobin 10.4 (L) 12.0 - 15.0 g/dL   HCT 33.1 (L) 36.0 - 46.0 %   MCV 90.9 78.0 - 100.0 fL   MCH 28.6 26.0 - 34.0 pg   MCHC 31.4 30.0 - 36.0 g/dL   RDW 13.3 11.5 - 15.5 %   Platelets 240 150 - 400 K/uL    Comment: Performed at [facility], [place] [address] [place], [place] [address]  Glucose, capillary     Status: None   Collection Time: 07/20/17  7:03 AM  Result Value Ref Range   Glucose-Capillary 94 65 - 99 mg/dL  Ammonia     Status: None   Collection Time: 07/20/17  9:24 AM  Result Value Ref Range   Ammonia 14 9 - 35 umol/L    Comment: Performed at [facility], [place] [address], [place], [place] [address]  Glucose, capillary     Status: Abnormal   Collection Time: 07/20/17 11:44 AM  Result Value Ref Range   Glucose-Capillary 139 (H) 65 - 99 mg/dL  Glucose, capillary     Status: None   Collection Time: 07/20/17  5:09 PM  Result Value Ref Range   Glucose-Capillary 89 65 - 99 mg/dL  Glucose, capillary     Status: None   Collection Time: 07/20/17 10:38 PM  Result Value Ref Range   Glucose-Capillary 71 65 - 99 mg/dL  Glucose, capillary     Status: Abnormal   Collection Time: 07/21/17 12:27 AM  Result Value Ref Range   Glucose-Capillary 106 (H) 65 - 99 mg/dL  Glucose, capillary     Status: None   Collection Time: 07/21/17  6:16 AM  Result Value Ref Range   Glucose-Capillary 84 65 - 99 mg/dL  Glucose, capillary  Status: None   Collection Time: 07/21/17 11:49 AM  Result Value Ref Range   Glucose-Capillary 90 65 - 99 mg/dL  Glucose, capillary     Status: Abnormal   Collection Time: 07/21/17   4:57 PM  Result Value Ref Range   Glucose-Capillary 134 (H) 65 - 99 mg/dL  Glucose, capillary     Status: Abnormal   Collection Time: 07/21/17  9:57 PM  Result Value Ref Range   Glucose-Capillary 118 (H) 65 - 99 mg/dL  Glucose, capillary     Status: Abnormal   Collection Time: 07/22/17  6:43 AM  Result Value Ref Range   Glucose-Capillary 110 (H) 65 - 99 mg/dL     Constitutional: No distress . Vital signs reviewed. HENT: Normocephalic.  Atraumatic. Eyes: EOMI. No discharge. Cardiovascular: RRR. No JVD. Respiratory: CTA Bilaterally. Normal effort. GI: BS +. Non-distended.Mild lower abd tenderness Musc: No edema or tenderness in extremities. Skin:   Intact. Warm and dry. Neuro:  Alert Right facial droop Alert/ oriented to person not time Motor: 4/5 RUE proximal to distal- stable 4+/5 LUE proximal to distal- stable RLE: 3-/5 HF, KE, ADF, 4/5 (stable) LLE: 3-/5 HF, KE, ADF stable Dysarthria Psych: Very flat.  Assessment/Plan: 1. 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 Physiatrist is providing close team supervision and 24 hour management of active medical problems listed below. Physiatrist and rehab team continue to assess barriers to discharge/monitor patient progress toward functional and medical goals. FIM: Function - Bathing Bathing activity did not occur: Refused Position: Shower Body parts bathed by patient: Chest, Abdomen, Front perineal area, Right upper leg, Left upper leg, Right arm, Left arm Body parts bathed by helper: Right lower leg, Left lower leg, Back, Buttocks  Function- Upper Body Dressing/Undressing What is the patient wearing?: Pull over shirt/dress Pull over shirt/dress - Perfomed by patient: Thread/unthread left  sleeve, Put head through opening, Pull shirt over trunk Pull over shirt/dress - Perfomed by helper: Thread/unthread right sleeve Assist Level: Supervision or verbal cues Function - Lower Body Dressing/Undressing What is the patient wearing?: Underwear, Pants, Ted Hose, Non-skid slipper socks Position: Wheelchair/chair at [employer] - Performed by helper: Thread/unthread right underwear leg, Thread/unthread left underwear leg, Pull underwear up/down Pants- Performed by patient: Thread/unthread right pants leg, Thread/unthread left pants leg Pants- Performed by helper: Thread/unthread right pants leg, Thread/unthread left pants leg, Pull pants up/down Non-skid slipper socks- Performed by helper: Don/doff right sock, Don/doff left sock Socks - Performed by helper: Don/doff right sock, Don/doff left sock Shoes - Performed by patient: Don/doff right shoe, Fasten right Shoes - Performed by helper: Don/doff left shoe, Fasten left TED Hose - Performed by helper: Don/doff right TED hose, Don/doff left TED hose Assist for footwear: Maximal assist Assist for lower body dressing: 2 Helpers  Function - Toileting Toileting steps completed by helper: Adjust clothing prior to toileting, Performs perineal hygiene, Adjust clothing after toileting Toileting Assistive Devices: Grab bar or rail  Function - Air cabin crew transfer assistive device: Grab bar Assist level to toilet: Moderate assist (Pt 50 - 74%/lift or lower) Assist level from toilet: Moderate assist (Pt 50 - 74%/lift or lower) Assist level to bedside commode (at bedside): Moderate assist (Pt 50 - 74%/lift or lower)(per Leann Nickles-Wright, NT) Assist level from bedside commode (at bedside): Moderate assist (Pt 50 -  74%/lift or lower)  Function - Chair/bed transfer Chair/bed transfer method: Ambulatory Chair/bed transfer assist level: Touching or steadying assistance (Pt > 75%) Chair/bed transfer assistive device: Bedrails,  Armrests  Function - Locomotion: Wheelchair Will patient use wheelchair at discharge?: No Wheelchair activity did not occur: Safety/medical concerns Wheel 50 feet with 2 turns activity did not occur: Safety/medical concerns Wheel 150 feet activity did not occur: Safety/medical concerns Function - Locomotion: Ambulation Assistive device: No device Max distance: 100' Assist level: Touching or steadying assistance (Pt > 75%) Assist level: Touching or steadying assistance (Pt > 75%) Assist level: Touching or steadying assistance (Pt > 75%) Walk 150 feet activity did not occur: Safety/medical concerns Assist level: Moderate assist (Pt 50 - 74%) Assist level: Moderate assist (Pt 50 - 74%)  Function - Comprehension Comprehension: Auditory Comprehension assist level: Understands basic 90% of the time/cues < 10% of the time  Function - Expression Expression: Verbal Expression assist level: Expresses basic 50 - 74% of the time/requires cueing 25 - 49% of the time. Needs to repeat parts of sentences.  Function - Social Interaction Social Interaction assist level: Interacts appropriately 75 - 89% of the time - Needs redirection for appropriate language or to initiate interaction.  Function - Problem Solving Problem solving assist level: Solves basic 50 - 74% of the time/requires cueing 25 - 49% of the time  Function - Memory Memory assist level: Recognizes or recalls 50 - 74% of the time/requires cueing 25 - 49% of the time Patient normally able to recall (first 3 days only): Current season, That he or she is in a hospital, Location of own room   Medical Problem List and Plan:  1. Right hemiparesis and functional deficits secondary to left periventricular white matter and right frontal lobe infarcts    Cont CIR - may resume full therapy- PT, OT, SLP  2. DVT Prophylaxis/Anticoagulation: Pharmaceutical: Lovenox 41m q 24h 3. Chronic HA/Pain Management: Depakote bid with tylenol prn.  Lower  extremity paresthesias  Trial Cymbalta 4. Mood: LCSW to follow for evaluation and support.    Emotional lability trial of Cymbalta.    If this is not helpful may consider SSRI 5. Neuropsych: This patient is not fully capable of making decisions on her own behalf.  6. Skin/Wound Care: routine pressure relief measures.  7. Fluids/Electrolytes/Nutrition: Monitor I/Os  8. 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 Monitor BS ac/hs. Educate on importance of compliance.  CBG (last 3)  Recent Labs    07/21/17 1657 07/21/17 2157 07/22/17 0643  GLUCAP 134* 118* 110*    controlled on 5/9 9. Dyslipidemia: On Crestor.  10. Anemia: Hgb stable at 9.5 on 5/1, improved to 10.4 on 5/7 11. HTN with orthostasis- will check q shift, no syncope x 2 d   Monitor BP bid. Reduce lisinopril Vitals:   07/21/17 2157 07/22/17 0545  BP:  (!) 141/68  Pulse:  79  Resp:  18  Temp: 98.4 F (36.9 C) 98.5 F (36.9 C)  SpO2:  99%   Ordered TEDs  Abdominal binder    Continue to monitor- IVF per Cardiology 12. Hypoalbuminemia  Supplement initiated on 5/4 13.  AMS probable seizure,CT neg forhemmorhagic transformation, check EEG On Depakene, reviewed Neuro outpt note, possible seizure in 2017 , has also been treated for migraines,check valproate level stat may need increased dose of depakene 14.  Mild abd tenderness improved KUB neg LOS (Days) 9 A  FACE TO FACE EVALUATION WAS PERFORMED  Charlett Blake 07/22/2017, 8:58 AM

## 2017-07-22 NOTE — Progress Notes (Signed)
Progress Note  Patient Name: Kelly Romero Date of Encounter: 07/22/2017  Primary Cardiologist: Nanetta Batty, MD   Subjective   52 yo with hx of CVA, admitted with syncope Was seen by Dr. Eldridge Dace yesterday .  Was found to be orthostatic.    better with IV saline   Echo - EF 60%. Mild MR ,  Loop recorder has been placed.   BP is better    Inpatient Medications    Scheduled Meds: . aspirin EC  81 mg Oral Daily  . clopidogrel  75 mg Oral Daily  . DULoxetine  30 mg Oral Daily  . enoxaparin (LOVENOX) injection  40 mg Subcutaneous Q24H  . feeding supplement (PRO-STAT SUGAR FREE 64)  30 mL Oral BID  . insulin aspart  0-15 Units Subcutaneous TID AC & HS  . insulin detemir  20 Units Subcutaneous Daily  . levETIRAcetam  500 mg Oral BID  . lisinopril  20 mg Oral BID  . rosuvastatin  40 mg Oral QHS  . simethicone  80 mg Oral QID  . Valproate Sodium  500 mg Oral BID   Continuous Infusions:  PRN Meds: acetaminophen, alum & mag hydroxide-simeth, bisacodyl, diphenhydrAMINE, guaiFENesin-dextromethorphan, polyethylene glycol, prochlorperazine **OR** prochlorperazine **OR** prochlorperazine, sodium phosphate   Vital Signs    Vitals:   07/21/17 1500 07/21/17 1945 07/21/17 2157 07/22/17 0545  BP: (!) 176/71 (!) 157/64  (!) 141/68  Pulse: 80 85  79  Resp:    18  Temp:   98.4 F (36.9 C) 98.5 F (36.9 C)  TempSrc:   Oral Oral  SpO2:  97%  99%  Weight:      Height:        Intake/Output Summary (Last 24 hours) at 07/22/2017 1320 Last data filed at 07/22/2017 1300 Gross per 24 hour  Intake 140 ml  Output -  Net 140 ml   Filed Weights   07/18/17 0221 07/19/17 0134 07/20/17 1804  Weight: 211 lb 13.8 oz (96.1 kg) 211 lb 13.8 oz (96.1 kg) 210 lb 5.1 oz (95.4 kg)    Telemetry     - Personally Reviewed  ECG      - Personally Reviewed  Physical Exam   GEN:  Middle age female,  Facial droop.  Slow to answer questions    Neck: No JVD Cardiac: RRR, no murmurs, rubs,  or gallops.  Respiratory: Clear to auscultation bilaterally. GI: Soft, nontender, non-distended  MS: No edema; No deformity. Neuro:  Nonfocal  Psych: Normal affect   Labs    Chemistry Recent Labs  Lab 07/19/17 0944 07/20/17 0625  NA 140 141  K 3.7 3.9  CL 102 103  CO2 30 30  GLUCOSE 155* 108*  BUN 15 19  CREATININE 1.14* 1.14*  CALCIUM 9.3 9.2  PROT 6.1*  --   ALBUMIN 2.8*  --   AST 18  --   ALT 12*  --   ALKPHOS 60  --   BILITOT 0.4  --   GFRNONAA 54* 54*  GFRAA >60 >60  ANIONGAP 8 8     Hematology Recent Labs  Lab 07/19/17 0944 07/20/17 0625  WBC 4.7 4.3  RBC 3.63* 3.64*  HGB 10.2* 10.4*  HCT 32.8* 33.1*  MCV 90.4 90.9  MCH 28.1 28.6  MCHC 31.1 31.4  RDW 13.3 13.3  PLT 252 240    Cardiac EnzymesNo results for input(s): TROPONINI in the last 168 hours. No results for input(s): TROPIPOC in the last 168 hours.  BNPNo results for input(s): BNP, PROBNP in the last 168 hours.   DDimer No results for input(s): DDIMER in the last 168 hours.   Radiology    Dg Swallowing Func-speech Pathology  Result Date: 07/22/2017 Objective Swallowing Evaluation: Type of Study: MBS-Modified Barium Swallow Study  Patient Details Name: Kelly Romero MRN: [id number] Date of Birth: 1965/09/09 Today's Date: 07/22/2017 Time: SLP Start Time (ACUTE ONLY): 4098 -SLP Stop Time (ACUTE ONLY): 0947 SLP Time Calculation (min) (ACUTE ONLY): 31 min Past Medical History: Past Medical History: Diagnosis Date . Diabetes mellitus without complication (HCC)  . Headache  . Hyperlipidemia  . Hypertension  . Stroke (HCC)  . TIA (transient ischemic attack)  Past Surgical History: Past Surgical History: Procedure Laterality Date . APPENDECTOMY   . CHOLECYSTECTOMY   . FOOT GANGLION EXCISION   . HERNIA REPAIR   . IR GENERIC HISTORICAL  12/30/2015  IR ANGIO INTRA EXTRACRAN SEL COM CAROTID INNOMINATE BILAT MOD SED 12/30/2015 Julieanne Cotton, MD [facility]INTER[facility] RAD . IR GENERIC HISTORICAL  12/30/2015  IR ANGIO  VERTEBRAL SEL VERTEBRAL BILAT MOD SED 12/30/2015 Julieanne Cotton, MD [facility] . LOOP RECORDER INSERTION N/A 07/13/2017  Procedure: LOOP RECORDER INSERTION;  Surgeon: Hillis Range, MD;  Location: [facility] INVASIVE CV LAB;  Service: Cardiovascular;  Laterality: N/A; . TEE WITHOUT CARDIOVERSION N/A 04/19/2015  Procedure: TRANSESOPHAGEAL ECHOCARDIOGRAM (TEE);  Surgeon: Jake Bathe, MD;  Location: [facility] ENDOSCOPY;  Service: Cardiovascular;  Laterality: N/A; . VAGINAL HYSTERECTOMY   HPI: Pt. is a 52 y.o. female who was admitted to [facility] with Aphasia, RUE weakness, slurred speech, and difficulty with word finding. Pt. has a PMHx of CVA, TIA, DM, Headache, Hyperlipidemia, HTN, TIA, Appendectomy, Cholecystectomy, Hernia Repair. She went home with family and had worsening of speech and returned to [facility] ED. MRI showed new L frontal lobe infarcts compared to MRI on 07/09/17.  Subjective: pt pleasant and cooperative Assessment / Plan / Recommendation CHL IP CLINICAL IMPRESSIONS 07/22/2017 Clinical Impression Of note, this is pt's first instrumental study d/t intermittent coughing at bedside with thin liquids. Pt presents with severe oral phase dysphagia that is further completed by moderate cognitive deficits. Pt's oral phase is c/b no lingual movement, tongue pumping and multiple attempts to transfer bolus posteriorally. Despite Total A multimodal cues as well as right head turn and supraglottic swallow, pt is not able to decrease oral phase or increase timeliness of swallow initiation. Chin tuck not attempted d/t no ability to transfer bolus posteriorally when head in neutral alignment.  Pt is able to orally contain nectar thick liquids and honey thick liquids, however with nectar thick liquids pt's delayed swallow results in silent deep penetration to cords with residue eventually being silently aspirated. Pt is not able to orally contain thin liquids which results in premature spillage of bolus and moderate silent aspiration. Pt  with delayed swallow initiation to valleculae with puree and honey thick liquids but no penetration or aspriation observed with these textures. At this time pt's is at a very high risk of aspiration. Given history of multiple CVAs, decreased recent progress, moderate silent aspiraiton, moderate cognitive deficits, significantly decreased vocal intensity and significantly weak cough, I am recommend puree diet with honey thick liquids, medicince crushed in puree and recommend long term alternative means of nutrition such as PEG.  SLP Visit Diagnosis Dysphagia, oral phase (R13.11) Attention and concentration deficit following -- Frontal lobe and executive function deficit following -- Impact on safety and function Risk for inadequate nutrition/hydration;Severe aspiration risk  CHL IP TREATMENT RECOMMENDATION 07/22/2017 Treatment Recommendations Therapy as outlined in treatment plan below   No flowsheet data found. CHL IP DIET RECOMMENDATION 07/22/2017 SLP Diet Recommendations Alternative means - long-term;Dysphagia 1 (Puree) solids;Honey thick liquids Liquid Administration via Cup;Spoon Medication Administration Whole meds with puree Compensations Minimize environmental distractions;Lingual sweep for clearance of pocketing;Slow rate;Small sips/bites Postural Changes Seated upright at 90 degrees   CHL IP OTHER RECOMMENDATIONS 07/22/2017 Recommended Consults -- Oral Care Recommendations Oral care BID Other Recommendations Order thickener from pharmacy;Prohibited food (jello, ice cream, thin soups);Have oral suction available;Remove water pitcher   No flowsheet data found.  CHL IP FREQUENCY AND DURATION 07/10/2017 Speech Therapy Frequency (ACUTE ONLY) min 3x week Treatment Duration --      CHL IP ORAL PHASE 07/22/2017 Oral Phase Impaired Oral - Pudding Teaspoon -- Oral - Pudding Cup -- Oral - Honey Teaspoon Weak lingual manipulation;Right anterior bolus loss;Lingual pumping;Reduced posterior propulsion;Holding of bolus;Delayed  oral transit Oral - Honey Cup Right anterior bolus loss;Weak lingual manipulation;Lingual pumping;Reduced posterior propulsion;Holding of bolus;Delayed oral transit Oral - Nectar Teaspoon Right anterior bolus loss;Weak lingual manipulation;Lingual pumping;Reduced posterior propulsion;Delayed oral transit;Holding of bolus Oral - Nectar Cup Right anterior bolus loss;Weak lingual manipulation;Lingual pumping;Reduced posterior propulsion;Holding of bolus;Delayed oral transit Oral - Nectar Straw -- Oral - Thin Teaspoon Right anterior bolus loss;Weak lingual manipulation;Lingual pumping;Reduced posterior propulsion;Holding of bolus;Premature spillage;Decreased bolus cohesion;Delayed oral transit Oral - Thin Cup -- Oral - Thin Straw -- Oral - Puree Right anterior bolus loss;Weak lingual manipulation;Lingual pumping;Reduced posterior propulsion;Holding of bolus;Delayed oral transit;Decreased bolus cohesion;Premature spillage Oral - Mech Soft -- Oral - Regular -- Oral - Multi-Consistency -- Oral - Pill -- Oral Phase - Comment --  CHL IP PHARYNGEAL PHASE 07/22/2017 Pharyngeal Phase Impaired Pharyngeal- Pudding Teaspoon -- Pharyngeal -- Pharyngeal- Pudding Cup -- Pharyngeal -- Pharyngeal- Honey Teaspoon Delayed swallow initiation-vallecula Pharyngeal -- Pharyngeal- Honey Cup Delayed swallow initiation-vallecula;Reduced laryngeal elevation Pharyngeal -- Pharyngeal- Nectar Teaspoon Delayed swallow initiation-pyriform sinuses;Penetration/Aspiration before swallow;Penetration/Aspiration during swallow;Reduced laryngeal elevation;Reduced anterior laryngeal mobility Pharyngeal Material enters airway, passes BELOW cords without attempt by patient to eject out (silent aspiration) Pharyngeal- Nectar Cup Delayed swallow initiation-pyriform sinuses;Reduced anterior laryngeal mobility;Reduced laryngeal elevation;Penetration/Aspiration before swallow;Penetration/Aspiration during swallow Pharyngeal Material enters airway, passes BELOW cords  without attempt by patient to eject out (silent aspiration) Pharyngeal- Nectar Straw -- Pharyngeal -- Pharyngeal- Thin Teaspoon Delayed swallow initiation-pyriform sinuses;Penetration/Aspiration before swallow;Penetration/Aspiration during swallow;Moderate aspiration;Reduced laryngeal elevation;Reduced anterior laryngeal mobility Pharyngeal Material enters airway, passes BELOW cords without attempt by patient to eject out (silent aspiration) Pharyngeal- Thin Cup NT Pharyngeal -- Pharyngeal- Thin Straw -- Pharyngeal -- Pharyngeal- Puree Delayed swallow initiation-vallecula Pharyngeal -- Pharyngeal- Mechanical Soft -- Pharyngeal -- Pharyngeal- Regular -- Pharyngeal -- Pharyngeal- Multi-consistency -- Pharyngeal -- Pharyngeal- Pill -- Pharyngeal -- Pharyngeal Comment --  CHL IP CERVICAL ESOPHAGEAL PHASE 07/22/2017 Cervical Esophageal Phase WFL Pudding Teaspoon -- Pudding Cup -- Honey Teaspoon -- Honey Cup -- Nectar Teaspoon -- Nectar Cup -- Nectar Straw -- Thin Teaspoon -- Thin Cup -- Thin Straw -- Puree -- Mechanical Soft -- Regular -- Multi-consistency -- Pill -- Cervical Esophageal Comment -- No flowsheet data found. Happi Overton 07/22/2017, 12:04 PM               Cardiac Studies     Patient Profile     52 y.o. female with syncope - evaluated by Dr .Hedy Jacob and was found to have orthostasis   Assessment & Plan    1.  Orthostatic hypotension:  She is working with  speech therapy. She may not be getting enough PO intake     2. Essential HTN:   Better with the higher dose of lisinopril  No active cardiology issues. Will sign off. Call for questions   For questions or updates, please contact [facility] Please consult [email or web address] for contact info under Cardiology/STEMI.      Signed, Kristeen Miss, MD  07/22/2017, 1:20 PM

## 2017-07-22 NOTE — Progress Notes (Signed)
Speech Language Pathology Daily Session Note  Patient Details  Name: KYNSLEY WHITEHOUSE MRN: [id number] Date of Birth: 11-Dec-1965  Today's Date: 07/22/2017 SLP Individual Time: 1130-1145 SLP Individual Time Calculation (min): 15 min  Short Term Goals: Week 2: SLP Short Term Goal 1 (Week 2): Pt will utilize word finding strategies to convey basic thoughts and ideas with Min A cues.  SLP Short Term Goal 2 (Week 2): Pt will utilize speech intelligibility strategies with Min A  to increase speech intelligibility to ~ 90% at the word level.  SLP Short Term Goal 3 (Week 2): Pt will solve semi-complex problem solving tasks with Mod A cues.  SLP Short Term Goal 4 (Week 2): Pt will demonstrate selective attention in moderately distracting environment for 30 minutes iwth Mod A cues.  SLP Short Term Goal 5 (Week 2): Pt will consume puree with honey thick liquids and minimal s/s of aspiration and Mod A for useof compensatory swallow strategies.  SLP Short Term Goal 5 - Progress (Week 2): Revised due to lack of progress  Skilled Therapeutic Interventions: Skilled treatment session focused on educating pt on results of Modified Barium Swallow Study and recommendation for dysphagia 1 with honey thick liquids, medicine crushed with puree and full nursing supervision. Pt with significantly decreased speech intelligibility d/t decreased vocal intensity and SLP unable to understand any verbalization this session. Pt demonstrated understanding of SLP's diet information by crying. Support given. Education provided to nursing and posted in pt's room. MD made aware.      Function:    Cognition Comprehension Comprehension assist level: Follows basic conversation/direction with extra time/assistive device;Follows basic conversation/direction with no assist  Expression   Expression assist level: Expresses basis less than 25% of the time/requires cueing >75% of the time.  Social Interaction Social Interaction assist  level: Interacts appropriately 50 - 74% of the time - May be physically or verbally inappropriate.  Problem Solving Problem solving assist level: Solves basic 50 - 74% of the time/requires cueing 25 - 49% of the time;Solves basic 25 - 49% of the time - needs direction more than half the time to initiate, plan or complete simple activities  Memory Memory assist level: Recognizes or recalls 50 - 74% of the time/requires cueing 25 - 49% of the time    Pain    Therapy/Group: Individual Therapy  Lelon Ikard 07/22/2017, 12:12 PM

## 2017-07-22 NOTE — Progress Notes (Signed)
Orthostatic VS obtained with b/p and variation  symptomatic after standing 3 mins with 2 staff assistance,, discomfort   of dizziness., no nausea ,vomiting or additional discomfort, closely monitor

## 2017-07-22 NOTE — Progress Notes (Signed)
Pt refusing medication prescribed. Stated she did not want medication to prevent issues that she did not have. She understands risks of not taking medication and stated her family did not have a say nor would they change her mind if she were to speak with them regarding her care. She was willing to take the blood pressure medication. She understood medications needed to be taken by mouth at this point and noted she did not want to have it. Frances Nickels

## 2017-07-23 ENCOUNTER — Inpatient Hospital Stay (HOSPITAL_COMMUNITY): Payer: Medicaid Other | Admitting: Physical Therapy

## 2017-07-23 ENCOUNTER — Inpatient Hospital Stay (HOSPITAL_COMMUNITY): Payer: Medicaid Other | Admitting: Speech Pathology

## 2017-07-23 ENCOUNTER — Inpatient Hospital Stay (HOSPITAL_COMMUNITY): Payer: Medicaid Other | Admitting: Occupational Therapy

## 2017-07-23 ENCOUNTER — Inpatient Hospital Stay (HOSPITAL_COMMUNITY): Payer: Medicaid Other

## 2017-07-23 LAB — GLUCOSE, CAPILLARY
GLUCOSE-CAPILLARY: 99 mg/dL (ref 65–99)
GLUCOSE-CAPILLARY: 151 mg/dL — AB (ref 65–99)
Glucose-Capillary: 151 mg/dL — ABNORMAL HIGH (ref 65–99)
Glucose-Capillary: 137 mg/dL — ABNORMAL HIGH (ref 65–99)

## 2017-07-23 NOTE — Progress Notes (Signed)
Speech Language Pathology Daily Session Note  Patient Details  Name: Kelly Romero MRN: [id number] Date of Birth: 10-Aug-1965  Today's Date: 07/23/2017 SLP Individual Time: 1405-1430 SLP Individual Time Calculation (min): 25 min  Short Term Goals: Week 2: SLP Short Term Goal 1 (Week 2): Pt will utilize word finding strategies to convey basic thoughts and ideas with Min A cues.  SLP Short Term Goal 2 (Week 2): Pt will utilize speech intelligibility strategies with Min A  to increase speech intelligibility to ~ 90% at the word level.  SLP Short Term Goal 3 (Week 2): Pt will solve semi-complex problem solving tasks with Mod A cues.  SLP Short Term Goal 4 (Week 2): Pt will demonstrate selective attention in moderately distracting environment for 30 minutes iwth Mod A cues.  SLP Short Term Goal 5 (Week 2): Pt will consume puree with honey thick liquids and minimal s/s of aspiration and Mod A for useof compensatory swallow strategies.  SLP Short Term Goal 5 - Progress (Week 2): Revised due to lack of progress  Skilled Therapeutic Interventions:  Pt was seen for skilled ST targeting communication and dysphagia goals.  SLP facilitated the session with skilled observations completed during presentations of pt's currently prescribed diet.  Pt has prolonged manipulation of boluses with mild diffuse oral residue and anterior labial spillage.  Intake was limited as usual. Min encouragement needed to eat.  Per OT report, pt has only received mashed potatoes and applesauce for the last several meals.  Therefore, pt and therapist made a list of foods that pt likes with mod assist verbal cues needed for increased vocal intensity to achieve intelligibility. Pt was left in recliner with call bell within reach and quick release belt donned.  Continue per current plan of care.    Function:  Eating Eating   Modified Consistency Diet: Yes Eating Assist Level: Supervision or verbal cues;Set up assist for    Eating Set Up Assist For: Opening containers       Cognition Comprehension Comprehension assist level: Follows basic conversation/direction with extra time/assistive device  Expression   Expression assist level: Expresses basic 50 - 74% of the time/requires cueing 25 - 49% of the time. Needs to repeat parts of sentences.  Social Interaction Social Interaction assist level: Interacts appropriately 75 - 89% of the time - Needs redirection for appropriate language or to initiate interaction.  Problem Solving Problem solving assist level: Solves basic 50 - 74% of the time/requires cueing 25 - 49% of the time  Memory Memory assist level: Recognizes or recalls 50 - 74% of the time/requires cueing 25 - 49% of the time    Pain Pain Assessment Pain Scale: 0-10 Pain Score: 0-No pain  Therapy/Group: Individual Therapy  Modell Fendrick, Melanee Spry 07/23/2017, 2:36 PM

## 2017-07-23 NOTE — Progress Notes (Signed)
Physical Therapy Note  Patient Details  Name: Kelly Romero MRN: [id number] Date of Birth: 11/11/1965 Today's Date: 07/23/2017    Time:  1045-1140 55 minutes  1:1 No c/o pain.  Pt agreeable to treatment.  Pt performs transfers and gait without device with min A.  nustep x 5 minutes with mod cuing for attention to task.  Pt then with episode of incontinence of bowel.  Pt performs toilet transfer with min/mod A, mod cuing for safety.  Mod A for clothing management and total A for hygiene.  Pt then requests drink. PT observed and cued pt drinking honey thick diet coke with several throat clears.  Pt left in room with needs at hand, quick release belt donned.   Forney Kleinpeter 07/23/2017, 12:46 PM

## 2017-07-23 NOTE — Progress Notes (Signed)
Attempted to call patient's daughter for update regarding need for PEG. No answer therefore message left to call the floor. Will go ahead and consult IR in anticipation of need for PEG.

## 2017-07-23 NOTE — Progress Notes (Signed)
Spoke with pt and daughter regarding PEG tube. They are unsure of discussion at this time. Daughter stated she would be back in the morning. Explained she could speak with Dr. Riley Kill that will be on call tomorrow for the questions she has regarding the tube. PA P. Love ordered consult for pallative care to discuss options and goals for pt and family. Family is aware of this order.

## 2017-07-23 NOTE — Progress Notes (Signed)
Subjective/Complaints:  Appreciate cardiology and SLP notes.  Place on nocturnal IVF to maintain adequate hydration on honey thick liquid.  Per speech chance of improvement in near term is poor and pt may need enteral tube  ROS-  Limited due to dysarthria and cognition, denies CP, SOB, vomiting, diarrhea.  Objective: Vital Signs: Blood pressure (!) 112/49, pulse (!) 104, temperature 98.4 F (36.9 C), temperature source Oral, resp. rate 17, height  (1.651 m), weight 95.4 kg (210 lb 5.1 oz), SpO2 96 %. Dg Swallowing Func-speech Pathology  Result Date: 07/22/2017 Objective Swallowing Evaluation: Type of Study: MBS-Modified Barium Swallow Study  Patient Details Name: JULEY GIOVANETTI MRN: [id number] Date of Birth: 1965/07/05 Today's Date: 07/22/2017 Time: SLP Start Time (ACUTE ONLY): 4098 -SLP Stop Time (ACUTE ONLY): 0947 SLP Time Calculation (min) (ACUTE ONLY): 31 min Past Medical History: Past Medical History: Diagnosis Date . Diabetes mellitus without complication (HCC)  . Headache  . Hyperlipidemia  . Hypertension  . Stroke (HCC)  . TIA (transient ischemic attack)  Past Surgical History: Past Surgical History: Procedure Laterality Date . APPENDECTOMY   . CHOLECYSTECTOMY   . FOOT GANGLION EXCISION   . HERNIA REPAIR   . IR GENERIC HISTORICAL  12/30/2015  IR ANGIO INTRA EXTRACRAN SEL COM CAROTID INNOMINATE BILAT MOD SED 12/30/2015 Julieanne Cotton, MD [facility]INTER[facility] RAD . IR GENERIC HISTORICAL  12/30/2015  IR ANGIO VERTEBRAL SEL VERTEBRAL BILAT MOD SED 12/30/2015 Julieanne Cotton, MD [facility] . LOOP RECORDER INSERTION N/A 07/13/2017  Procedure: LOOP RECORDER INSERTION;  Surgeon: Hillis Range, MD;  Location: [facility] INVASIVE CV LAB;  Service: Cardiovascular;  Laterality: N/A; . TEE WITHOUT CARDIOVERSION N/A 04/19/2015  Procedure: TRANSESOPHAGEAL ECHOCARDIOGRAM (TEE);  Surgeon: Jake Bathe, MD;  Location: [facility] ENDOSCOPY;  Service: Cardiovascular;  Laterality: N/A; . VAGINAL HYSTERECTOMY   HPI: Pt. is a 52 y.o.  female who was admitted to [facility] with Aphasia, RUE weakness, slurred speech, and difficulty with word finding. Pt. has a PMHx of CVA, TIA, DM, Headache, Hyperlipidemia, HTN, TIA, Appendectomy, Cholecystectomy, Hernia Repair. She went home with family and had worsening of speech and returned to [facility] ED. MRI showed new L frontal lobe infarcts compared to MRI on 07/09/17.  Subjective: pt pleasant and cooperative Assessment / Plan / Recommendation CHL IP CLINICAL IMPRESSIONS 07/22/2017 Clinical Impression Of note, this is pt's first instrumental study d/t intermittent coughing at bedside with thin liquids. Pt presents with severe oral phase dysphagia that is further completed by moderate cognitive deficits. Pt's oral phase is c/b no lingual movement, tongue pumping and multiple attempts to transfer bolus posteriorally. Despite Total A multimodal cues as well as right head turn and supraglottic swallow, pt is not able to decrease oral phase or increase timeliness of swallow initiation. Chin tuck not attempted d/t no ability to transfer bolus posteriorally when head in neutral alignment.  Pt is able to orally contain nectar thick liquids and honey thick liquids, however with nectar thick liquids pt's delayed swallow results in silent deep penetration to cords with residue eventually being silently aspirated. Pt is not able to orally contain thin liquids which results in premature spillage of bolus and moderate silent aspiration. Pt with delayed swallow initiation to valleculae with puree and honey thick liquids but no penetration or aspriation observed with these textures. At this time pt's is at a very high risk of aspiration. Given history of multiple CVAs, decreased recent progress, moderate silent aspiraiton, moderate cognitive deficits, significantly decreased vocal intensity and significantly weak cough, I am  recommend puree diet with honey thick liquids, medicince crushed in puree and recommend long term alternative  means of nutrition such as PEG.  SLP Visit Diagnosis Dysphagia, oral phase (R13.11) Attention and concentration deficit following -- Frontal lobe and executive function deficit following -- Impact on safety and function Risk for inadequate nutrition/hydration;Severe aspiration risk   CHL IP TREATMENT RECOMMENDATION 07/22/2017 Treatment Recommendations Therapy as outlined in treatment plan below   No flowsheet data found. CHL IP DIET RECOMMENDATION 07/22/2017 SLP Diet Recommendations Alternative means - long-term;Dysphagia 1 (Puree) solids;Honey thick liquids Liquid Administration via Cup;Spoon Medication Administration Whole meds with puree Compensations Minimize environmental distractions;Lingual sweep for clearance of pocketing;Slow rate;Small sips/bites Postural Changes Seated upright at 90 degrees   CHL IP OTHER RECOMMENDATIONS 07/22/2017 Recommended Consults -- Oral Care Recommendations Oral care BID Other Recommendations Order thickener from pharmacy;Prohibited food (jello, ice cream, thin soups);Have oral suction available;Remove water pitcher   No flowsheet data found.  CHL IP FREQUENCY AND DURATION 07/10/2017 Speech Therapy Frequency (ACUTE ONLY) min 3x week Treatment Duration --      CHL IP ORAL PHASE 07/22/2017 Oral Phase Impaired Oral - Pudding Teaspoon -- Oral - Pudding Cup -- Oral - Honey Teaspoon Weak lingual manipulation;Right anterior bolus loss;Lingual pumping;Reduced posterior propulsion;Holding of bolus;Delayed oral transit Oral - Honey Cup Right anterior bolus loss;Weak lingual manipulation;Lingual pumping;Reduced posterior propulsion;Holding of bolus;Delayed oral transit Oral - Nectar Teaspoon Right anterior bolus loss;Weak lingual manipulation;Lingual pumping;Reduced posterior propulsion;Delayed oral transit;Holding of bolus Oral - Nectar Cup Right anterior bolus loss;Weak lingual manipulation;Lingual pumping;Reduced posterior propulsion;Holding of bolus;Delayed oral transit Oral - Nectar Straw -- Oral  - Thin Teaspoon Right anterior bolus loss;Weak lingual manipulation;Lingual pumping;Reduced posterior propulsion;Holding of bolus;Premature spillage;Decreased bolus cohesion;Delayed oral transit Oral - Thin Cup -- Oral - Thin Straw -- Oral - Puree Right anterior bolus loss;Weak lingual manipulation;Lingual pumping;Reduced posterior propulsion;Holding of bolus;Delayed oral transit;Decreased bolus cohesion;Premature spillage Oral - Mech Soft -- Oral - Regular -- Oral - Multi-Consistency -- Oral - Pill -- Oral Phase - Comment --  CHL IP PHARYNGEAL PHASE 07/22/2017 Pharyngeal Phase Impaired Pharyngeal- Pudding Teaspoon -- Pharyngeal -- Pharyngeal- Pudding Cup -- Pharyngeal -- Pharyngeal- Honey Teaspoon Delayed swallow initiation-vallecula Pharyngeal -- Pharyngeal- Honey Cup Delayed swallow initiation-vallecula;Reduced laryngeal elevation Pharyngeal -- Pharyngeal- Nectar Teaspoon Delayed swallow initiation-pyriform sinuses;Penetration/Aspiration before swallow;Penetration/Aspiration during swallow;Reduced laryngeal elevation;Reduced anterior laryngeal mobility Pharyngeal Material enters airway, passes BELOW cords without attempt by patient to eject out (silent aspiration) Pharyngeal- Nectar Cup Delayed swallow initiation-pyriform sinuses;Reduced anterior laryngeal mobility;Reduced laryngeal elevation;Penetration/Aspiration before swallow;Penetration/Aspiration during swallow Pharyngeal Material enters airway, passes BELOW cords without attempt by patient to eject out (silent aspiration) Pharyngeal- Nectar Straw -- Pharyngeal -- Pharyngeal- Thin Teaspoon Delayed swallow initiation-pyriform sinuses;Penetration/Aspiration before swallow;Penetration/Aspiration during swallow;Moderate aspiration;Reduced laryngeal elevation;Reduced anterior laryngeal mobility Pharyngeal Material enters airway, passes BELOW cords without attempt by patient to eject out (silent aspiration) Pharyngeal- Thin Cup NT Pharyngeal -- Pharyngeal- Thin  Straw -- Pharyngeal -- Pharyngeal- Puree Delayed swallow initiation-vallecula Pharyngeal -- Pharyngeal- Mechanical Soft -- Pharyngeal -- Pharyngeal- Regular -- Pharyngeal -- Pharyngeal- Multi-consistency -- Pharyngeal -- Pharyngeal- Pill -- Pharyngeal -- Pharyngeal Comment --  CHL IP CERVICAL ESOPHAGEAL PHASE 07/22/2017 Cervical Esophageal Phase WFL Pudding Teaspoon -- Pudding Cup -- Honey Teaspoon -- Honey Cup -- Nectar Teaspoon -- Nectar Cup -- Nectar Straw -- Thin Teaspoon -- Thin Cup -- Thin Straw -- Puree -- Mechanical Soft -- Regular -- Multi-consistency -- Pill -- Cervical Esophageal Comment -- No flowsheet data found. Happi Overton 07/22/2017, 12:04 PM  Results for orders placed or performed during the hospital encounter of 07/13/17 (from the past 72 hour(s))  Ammonia     Status: None   Collection Time: 07/20/17  9:24 AM  Result Value Ref Range   Ammonia 14 9 - 35 umol/L    Comment: Performed at [facility], [address], [place], [place] [address]  Glucose, capillary     Status: Abnormal   Collection Time: 07/20/17 11:44 AM  Result Value Ref Range   Glucose-Capillary 139 (H) 65 - 99 mg/dL  Glucose, capillary     Status: None   Collection Time: 07/20/17  5:09 PM  Result Value Ref Range   Glucose-Capillary 89 65 - 99 mg/dL  Glucose, capillary     Status: None   Collection Time: 07/20/17 10:38 PM  Result Value Ref Range   Glucose-Capillary 71 65 - 99 mg/dL  Glucose, capillary     Status: Abnormal   Collection Time: 07/21/17 12:27 AM  Result Value Ref Range   Glucose-Capillary 106 (H) 65 - 99 mg/dL  Glucose, capillary     Status: None   Collection Time: 07/21/17  6:16 AM  Result Value Ref Range   Glucose-Capillary 84 65 - 99 mg/dL  Glucose, capillary     Status: None   Collection Time: 07/21/17 11:49 AM  Result Value Ref Range   Glucose-Capillary 90 65 - 99 mg/dL  Glucose, capillary     Status: Abnormal   Collection Time: 07/21/17  4:57 PM  Result Value Ref  Range   Glucose-Capillary 134 (H) 65 - 99 mg/dL  Glucose, capillary     Status: Abnormal   Collection Time: 07/21/17  9:57 PM  Result Value Ref Range   Glucose-Capillary 118 (H) 65 - 99 mg/dL  Glucose, capillary     Status: Abnormal   Collection Time: 07/22/17  6:43 AM  Result Value Ref Range   Glucose-Capillary 110 (H) 65 - 99 mg/dL  Glucose, capillary     Status: Abnormal   Collection Time: 07/22/17 11:24 AM  Result Value Ref Range   Glucose-Capillary 110 (H) 65 - 99 mg/dL  Glucose, capillary     Status: Abnormal   Collection Time: 07/22/17  4:39 PM  Result Value Ref Range   Glucose-Capillary 119 (H) 65 - 99 mg/dL  Glucose, capillary     Status: Abnormal   Collection Time: 07/22/17 10:06 PM  Result Value Ref Range   Glucose-Capillary 151 (H) 65 - 99 mg/dL  Glucose, capillary     Status: None   Collection Time: 07/23/17  7:00 AM  Result Value Ref Range   Glucose-Capillary 99 65 - 99 mg/dL   Comment 1 Notify RN      Constitutional: No distress . Vital signs reviewed. HENT: Normocephalic.  Atraumatic. Eyes: EOMI. No discharge. Cardiovascular: RRR. No JVD. Respiratory: CTA Bilaterally. Normal effort. GI: BS +. Non-distended.Mild lower abd tenderness Musc: No edema or tenderness in extremities. Skin:   Intact. Warm and dry. Neuro:  Alert Right facial droop Alert/ oriented to person not time Motor: 4/5 RUE proximal to distal- stable 4+/5 LUE proximal to distal- stable RLE: 3-/5 HF, KE, ADF, 4/5 (stable) LLE: 3-/5 HF, KE, ADF stable Dysarthria Psych: Very flat.  Assessment/Plan: 1. 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 Physiatrist is providing close team  supervision and 24 hour management of active medical problems listed  below. Physiatrist and rehab team continue to assess barriers to discharge/monitor patient progress toward functional and medical goals. FIM: Function - Bathing Bathing activity did not occur: Refused Position: Shower Body parts bathed by patient: Chest, Abdomen, Front perineal area, Right upper leg, Left upper leg, Right arm, Left arm Body parts bathed by helper: Right lower leg, Left lower leg, Back, Buttocks  Function- Upper Body Dressing/Undressing What is the patient wearing?: Pull over shirt/dress Pull over shirt/dress - Perfomed by patient: Thread/unthread left sleeve, Put head through opening, Pull shirt over trunk Pull over shirt/dress - Perfomed by helper: Thread/unthread right sleeve Assist Level: Supervision or verbal cues Function - Lower Body Dressing/Undressing What is the patient wearing?: Underwear, Pants, Ted Hose, Non-skid slipper socks Position: Wheelchair/chair at [employer] - Performed by helper: Thread/unthread right underwear leg, Thread/unthread left underwear leg, Pull underwear up/down Pants- Performed by patient: Thread/unthread right pants leg, Thread/unthread left pants leg Pants- Performed by helper: Thread/unthread right pants leg, Thread/unthread left pants leg, Pull pants up/down Non-skid slipper socks- Performed by helper: Don/doff right sock, Don/doff left sock Socks - Performed by helper: Don/doff right sock, Don/doff left sock Shoes - Performed by patient: Don/doff right shoe, Fasten right Shoes - Performed by helper: Don/doff left shoe, Fasten left TED Hose - Performed by helper: Don/doff right TED hose, Don/doff left TED hose Assist for footwear: Maximal assist Assist for lower body dressing: 2 Helpers  Function - Toileting Toileting steps completed by helper: Adjust clothing prior to toileting, Performs perineal hygiene, Adjust clothing after toileting Toileting Assistive Devices: Grab bar or rail  Function - Archivist  transfer assistive device: Grab bar Assist level to toilet: Moderate assist (Pt 50 - 74%/lift or lower) Assist level from toilet: Moderate assist (Pt 50 - 74%/lift or lower) Assist level to bedside commode (at bedside): Moderate assist (Pt 50 - 74%/lift or lower)(per Leann Nickles-Wright, NT) Assist level from bedside commode (at bedside): Moderate assist (Pt 50 - 74%/lift or lower)  Function - Chair/bed transfer Chair/bed transfer method: Ambulatory Chair/bed transfer assist level: Touching or steadying assistance (Pt > 75%) Chair/bed transfer assistive device: Bedrails, Armrests  Function - Locomotion: Wheelchair Will patient use wheelchair at discharge?: No Wheelchair activity did not occur: Safety/medical concerns Wheel 50 feet with 2 turns activity did not occur: Safety/medical concerns Wheel 150 feet activity did not occur: Safety/medical concerns Function - Locomotion: Ambulation Assistive device: No device Max distance: 100' Assist level: Touching or steadying assistance (Pt > 75%) Assist level: Touching or steadying assistance (Pt > 75%) Assist level: Touching or steadying assistance (Pt > 75%) Walk 150 feet activity did not occur: Safety/medical concerns Assist level: Moderate assist (Pt 50 - 74%) Assist level: Moderate assist (Pt 50 - 74%)  Function - Comprehension Comprehension: Auditory Comprehension assist level: Follows basic conversation/direction with extra time/assistive device, Follows basic conversation/direction with no assist  Function - Expression Expression: Verbal Expression assist level: Expresses basis less than 25% of the time/requires cueing >75% of the time.  Function - Social Interaction Social Interaction assist level: Interacts appropriately 50 - 74% of the time - May be physically or verbally inappropriate.  Function - Problem Solving Problem solving assist level: Solves basic 50 - 74% of the time/requires cueing 25 - 49% of the time  Function  - Memory Memory assist level: Recognizes or recalls 50 - 74% of the time/requires cueing 25 - 49% of the time Patient normally able to  recall (first 3 days only): Current season, That he or she is in a hospital, Location of own room   Medical Problem List and Plan:  1. Right hemiparesis and functional deficits secondary to left periventricular white matter and right frontal lobe infarcts    Cont CIR -  PT, OT, SLP  2. DVT Prophylaxis/Anticoagulation: Pharmaceutical: Lovenox  q 24h 3. Chronic HA/Pain Management: Depakote bid with tylenol prn.  Lower extremity paresthesias  Trial Cymbalta 4. Mood: LCSW to follow for evaluation and support.    Emotional lability trial of Cymbalta.    If this is not helpful may consider SSRI 5. Neuropsych: This patient is not fully capable of making decisions on her own behalf.  6. Skin/Wound Care: routine pressure relief measures.  7. Fluids/Electrolytes/Nutrition: Monitor I/Os  8. 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 Monitor BS ac/hs. Educate on importance of compliance.  CBG (last 3)  Recent Labs    07/22/17 1639 07/22/17 2206 07/23/17 0700  GLUCAP 119* 151* 99    controlled on 5/10 9. Dyslipidemia: On Crestor.  10. Anemia: Hgb stable at 9.5 on 5/1, improved to 10.4 on 5/7 11. HTN with orthostasis- will check q shift, no syncope x 2 d   Monitor BP bid. Reduce lisinopril Vitals:   07/23/17 0632 07/23/17 0636  BP: 113/65 (!) 112/49  Pulse: (!) 101 (!) 104  Resp:    Temp:    SpO2:     Ordered TEDs  Abdominal binder    Continue to monitor- IVF per Cardiology 12. Hypoalbuminemia  Supplement initiated on 5/4 13.  AMS probable seizure,CT neg forhemmorhagic transformation, EEG neg for seizure , slowing noted,  On Depakene, reviewed Neuro outpt note, possible seizure in 2017 , has also been treated for migraines per outpt Neuro Dr Everlena Cooper, added Keppra to  Valproate depakene 14.  Mild abd tenderness improved KUB neg 15.  Dysphagia moderately severe- supplement fluids with IV at noc, as discussed with pt, recommendation is for PEG but pt needs to confer with family.  If PEG option is selected then will need to hold plavix x 5d and Lovenox x 24h discussed increase CVA risk during that time If pt refuses PEG will need Palliative consult for goals of care LOS (Days) 10 A FACE TO FACE EVALUATION WAS PERFORMED  Erick Colace 07/23/2017, 8:53 AM

## 2017-07-23 NOTE — Progress Notes (Signed)
Patient continue to refused several of her schedule medications,She states she is "dealing with a lot of things",Support provided.

## 2017-07-23 NOTE — Progress Notes (Addendum)
Occupational Therapy Session Note  Patient Details  Name: Kelly Romero MRN: [id number] Date of Birth: [birth year]  Today's Date: 07/23/2017 OT Individual Time: 0845-1000 and 1330-1400  OT Individual Time Calculation (min): 75 min  And 30 min    Short Term Goals: Week 2:  OT Short Term Goal 1 (Week 2): Pt will ambulate into bathroom with CGA using LRAD. OT Short Term Goal 2 (Week 2): Pt will complete 2 grooming tasks standing at sink with close supervision in order to increase functional activity tolerance OT Short Term Goal 3 (Week 2): Pt will complete 3/3 toileting tasks with guarding assist OT Short Term Goal 4 (Week 2): Pt will use R UE at stabilizer level mod I  Skilled Therapeutic Interventions/Progress Updates:    Session One: Pt seen for OT session focusing on self feeding and education. Pt in supine upon arrival with RN present, pt not yet having had breakfast. Therapist plated pt's breakfast on real plate and present D1 diet in more appetizing manner. Pt agreeable to eating breakfast seated EOB. Alternated use of R/L UE for self-feeding tasks, requiring min A to bring utensil all the way to mouth when self feeding with R UE due to apraxia at termination. Kelly Romero cuing required for attention to R pocketing and following SLP orders. Pt able to manage drinking cup using L UE mod I.  She dressed seated EOB, with increased time and supervision able to don shirt and thread pants without assist. Stood from EOB for pericare/buttock hygiene and LB clothing management to be completed total A. Pt tolerated ~2 minutes static standing, stating "only a little dizziness'. Mod A stand pivot to recliner. BP following stand once in recliner 154/74.  Pt left seated in recliner at end of session, all needs in reach, RN made aware of pt's position.  Education provided throughout session regarding importance of eating as part of fueling therapies, pt stating " I don't want to live like that" in regards to  use of supplemental feeding tube, provided education and encouragement within scope of practice.   Session Two: Pt seen for OT session focusing on forced use of R UE in functional tasks. Pt sitting up in recliner upon arrival, agreeable to tx session.  Completed functional reaching task with pt required to obtain self-care item from therapist and place next to herself on R using only R UE. Required increased time but was able to grasp/release 2 self care items in ~3 minute time period. Pt's lunch tray then arrived and pt agreeable to eating Magic Cup. Initially tried self feeding with R UE, however, required min A with increased time and effort. With cuing, switched hands to use R UE at stabilizer level, and self feed with L UE successfully. Pt also requesting to eat mashed potatoes and while didn't finish either dish, was initiating more eating than in previous days. Pt with multiple questions regarding PEG tube and palliative care as MD mentioned this during AM Kelly Romero. Questions answered as able by this therapist, will alert PA to pt's questions in order to cont to educate pt on options going forward.  Pt left seated in recliner at end of session, all needs in reach with QRB donned.   Therapy Documentation Precautions:  Precautions Precautions: Fall Restrictions Weight Bearing Restrictions: No Pain:   No/denies pain  See Function Navigator for Current Functional Status.   Therapy/Group: Individual Therapy  Kelly Romero L 07/23/2017, 7:03 AM

## 2017-07-23 NOTE — Progress Notes (Signed)
Physical Therapy Session Note  Patient Details  Name: SUA SPADAFORA MRN: [id number] Date of Birth: 1965-09-29  Today's Date: 07/23/2017 PT Individual Time: 1005-1045 PT Individual Time Calculation (min): 40 min   Short Term Goals: Week 2:  PT Short Term Goal 1 (Week 2): Pt will ambulate 50 ft with LRAD & supervision. PT Short Term Goal 2 (Week 2): Pt will negotiate 12 steps with B rails and mod assist for strengthening purposes.  Skilled Therapeutic Interventions/Progress Updates:    Pt seen for makeup therapy minutes. Pt seated in recliner in room, agreeable to PT. Stand pivot transfer recliner to w/c with minA for balance and cues for safety and environment awareness. BP 152/67 in sitting. Took pt outdoors for improved mood and environmental stimulation. Ambulation x 50 ft across slightly uneven ground with minA for balance and no AD, ataxic gait pattern and decreased RLE clearance. Pt fatigues quickly with ambulation and requests to sit down. Seated BLE therex x 10 reps. Assisted pt back to recliner at end of session, minA for stand pivot transfer. Pt left seated in recliner with needs in reach and quick-release belt in place.  Therapy Documentation Precautions:  Precautions Precautions: Fall Restrictions Weight Bearing Restrictions: No  See Function Navigator for Current Functional Status.   Therapy/Group: Individual Therapy  Peter Congo, PT, DPT  07/23/2017, 10:57 AM

## 2017-07-24 ENCOUNTER — Inpatient Hospital Stay (HOSPITAL_COMMUNITY): Payer: Medicaid Other | Admitting: Physical Therapy

## 2017-07-24 ENCOUNTER — Inpatient Hospital Stay (HOSPITAL_COMMUNITY): Payer: Medicaid Other | Admitting: Speech Pathology

## 2017-07-24 DIAGNOSIS — R1312 Dysphagia, oropharyngeal phase: Secondary | ICD-10-CM

## 2017-07-24 LAB — GLUCOSE, CAPILLARY
GLUCOSE-CAPILLARY: 109 mg/dL — AB (ref 65–99)
Glucose-Capillary: 126 mg/dL — ABNORMAL HIGH (ref 65–99)
Glucose-Capillary: 141 mg/dL — ABNORMAL HIGH (ref 65–99)
Glucose-Capillary: 91 mg/dL (ref 65–99)

## 2017-07-24 MED ORDER — METOPROLOL TARTRATE 12.5 MG HALF TABLET
12.5000 mg | ORAL_TABLET | Freq: Two times a day (BID) | ORAL | Status: DC
  Administered 2017-07-24 – 2017-07-28 (×7): 12.5 mg via ORAL
  Filled 2017-07-24 (×9): qty 1

## 2017-07-24 NOTE — Progress Notes (Signed)
Patient resting quietly throughout shift, VS monitor closely, reinforced education with possible PEG placement without verbal response from patient except "the medications does not taste well". Patient did consume and tolerate most of night medication. IVF w/ KCL infusing to right hand w/o concerns @ 75 cc/hr. Monitor and assisted

## 2017-07-24 NOTE — Progress Notes (Signed)
Patient ID: Kelly Romero, female   DOB: [birth year], 52 y.o.   MRN: [id number]   Percutaneous gastric tube has been requested  Anatomy approved per CT  Plan for 5 days from now Plavix held today LD 5/11  Plan 5/16 or 5/17

## 2017-07-24 NOTE — Progress Notes (Signed)
Speech Language Pathology Daily Session Note  Patient Details  Name: SHINEKA AUBLE MRN: [id number] Date of Birth: 06/20/1965  Today's Date: 07/24/2017 SLP Individual Time: 1030-1100 SLP Individual Time Calculation (min): 30 min  Short Term Goals: Week 2: SLP Short Term Goal 1 (Week 2): Pt will utilize word finding strategies to convey basic thoughts and ideas with Min A cues.  SLP Short Term Goal 2 (Week 2): Pt will utilize speech intelligibility strategies with Min A  to increase speech intelligibility to ~ 90% at the word level.  SLP Short Term Goal 3 (Week 2): Pt will solve semi-complex problem solving tasks with Mod A cues.  SLP Short Term Goal 4 (Week 2): Pt will demonstrate selective attention in moderately distracting environment for 30 minutes iwth Mod A cues.  SLP Short Term Goal 5 (Week 2): Pt will consume puree with honey thick liquids and minimal s/s of aspiration and Mod A for useof compensatory swallow strategies.  SLP Short Term Goal 5 - Progress (Week 2): Revised due to lack of progress  Skilled Therapeutic Interventions: Skilled treatment session focused on cognitive goals. SLP facilitated session by providing extra time and Max faded to Min A by end of session for problem solving dutring a basic money management task. Patient left upright in wheelchair with daughter present, quick release belt in place and all needs within reach. Continue with current plan of care.      Function:   Cognition Comprehension Comprehension assist level: Follows basic conversation/direction with extra time/assistive device  Expression   Expression assist level: Expresses basic 50 - 74% of the time/requires cueing 25 - 49% of the time. Needs to repeat parts of sentences.  Social Interaction Social Interaction assist level: Interacts appropriately 75 - 89% of the time - Needs redirection for appropriate language or to initiate interaction.  Problem Solving Problem solving assist level:  Solves basic 50 - 74% of the time/requires cueing 25 - 49% of the time  Memory Memory assist level: Recognizes or recalls 50 - 74% of the time/requires cueing 25 - 49% of the time    Pain No/Denies Pain   Therapy/Group: Individual Therapy  Jamarr Treinen 07/24/2017, 12:57 PM

## 2017-07-24 NOTE — Progress Notes (Signed)
Physical Therapy Session Note  Patient Details  Name: Kelly Romero MRN: [id number] Date of Birth: [birth year]  Today's Date: 07/24/2017 PT Individual Time: 0950-1020 PT Individual Time Calculation (min): 30 min   Short Term Goals: Week 2:  PT Short Term Goal 1 (Week 2): Pt will ambulate 50 ft with LRAD & supervision. PT Short Term Goal 2 (Week 2): Pt will negotiate 12 steps with B rails and mod assist for strengthening purposes.  Skilled Therapeutic Interventions/Progress Updates:  Pt received in bed with daughter arriving shortly after therapist. Pt agreeable to tx but no c/o pain reported. Pt reports incontinent void - therapist educated on need to call for assistance to be cleaned up and therapist reviewed use of call bell with pt return demonstrating. Therapist donned B thigh high ted hose total assist. Pt transfers to sitting EOB with min assist, HOB elevated, and bed rails with cuing for hand placement and sequencing and assistance to shift RLE forward.  Pt reports dizziness when asked while sitting EOB but reports it quickly goes away. Pt ambulates to bathroom with RW & min assist with decreased safety with AD; pt would benefit from education regarding proper use of RW. Pt requires assistance to pull brief & pants down over hips and max assist to don clean shirt, pants, & brief. Pt with R lateral lean and requires min assist for standing balance. Pt with continent void on toilet. At end of session pt left sitting in w/c in room with chair alarm & quick release belt, needs within reach, & daughter present to supervise.   Therapy Documentation Precautions:  Precautions Precautions: Fall Restrictions Weight Bearing Restrictions: No   See Function Navigator for Current Functional Status.   Therapy/Group: Individual Therapy  Sandi Mariscal 07/24/2017, 10:45 AM

## 2017-07-24 NOTE — Progress Notes (Signed)
Subjective/Complaints:  Pt lying in bed. Asked "do I need that tube?". Wanted me to speak with family  ROS: Limited due to cognitive/behavioral   Objective: Vital Signs: Blood pressure (!) 189/83, pulse 76, temperature 98 F (36.7 C), temperature source Oral, resp. rate 16, height 5\' 5"  (1.651 m), weight 95.4 kg (210 lb 5.1 oz), SpO2 98 %. Ct Abdomen Wo Contrast  Result Date: 07/24/2017 CLINICAL DATA:  Evaluate anatomy prior to potential percutaneous gastrostomy tube placement. EXAM: CT ABDOMEN WITHOUT CONTRAST TECHNIQUE: Multidetector CT imaging of the abdomen was performed following the standard protocol without IV contrast. COMPARISON:  CT abdomen and pelvis-05/03/2014 FINDINGS: Lower chest: Limited visualization of the lower thorax demonstrates minimal dependent subpleural ground-glass atelectasis. No discrete focal airspace opacities. Heart size. No pericardial effusion. Calcifications of the mitral valve annulus. Hepatobiliary: Normal hepatic contour. Post cholecystectomy. No ascites. Pancreas: Partial fatty replacement of the pancreas. Spleen: Normal noncontrast appearance the spleen Adrenals/Urinary Tract: Punctate (approximately 4 mm) nonobstructing left-sided renal stone. No evidence of right-sided nephrolithiasis. No urine obstruction or perinephric stranding. Normal noncontrast appearance the bilateral adrenal glands. Urinary bladder was not imaged. Stomach/Bowel: Normal orientation of the stomach without interposed liver or colon between the anterior wall the stomach and ventral wall the abdomen. Enteric contrast is seen throughout the colon. No evidence of enteric obstruction. Vascular/Lymphatic: Minimal amount of atherosclerotic plaque with a normal caliber abdominal aorta. No bulky porta hepatis, retroperitoneal mesenteric adenopathy on this noncontrast examination. Other: Post ventral abdominal wall hernia repair without evidence of recurrence within the imaged segment. Note, the  cranial aspect of the surgical mesh appears caudal to the expected location of the insertion site of the gastrostomy tube. Musculoskeletal: No acute or aggressive osseous abnormalities. Stigmata of DISH within the lower thoracic spine. IMPRESSION: 1. Gastric anatomy amenable to potential percutaneous gastrostomy tube placement as indicated. 2. Post mesh repair of the ventral aspect of the abdomen, however the cranial aspect of the surgical mesh appears caudal to the expected insertion site of the gastrostomy tube. 3. Punctate (approximately 4 mm) nonobstructing left-sided renal stone, similar to the 2/16 examination. 4.  Aortic Atherosclerosis (ICD10-I70.0). Electronically Signed   By: Simonne Come M.D.   On: 07/24/2017 08:53   Dg Swallowing Func-speech Pathology  Result Date: 07/22/2017 Objective Swallowing Evaluation: Type of Study: MBS-Modified Barium Swallow Study  Patient Details Name: Kelly Romero MRN: [id number] Date of Birth: 1965-11-02 Today's Date: 07/22/2017 Time: SLP Start Time (ACUTE ONLY): 4098 -SLP Stop Time (ACUTE ONLY): 0947 SLP Time Calculation (min) (ACUTE ONLY): 31 min Past Medical History: Past Medical History: Diagnosis Date . Diabetes mellitus without complication (HCC)  . Headache  . Hyperlipidemia  . Hypertension  . Stroke (HCC)  . TIA (transient ischemic attack)  Past Surgical History: Past Surgical History: Procedure Laterality Date . APPENDECTOMY   . CHOLECYSTECTOMY   . FOOT GANGLION EXCISION   . HERNIA REPAIR   . IR GENERIC HISTORICAL  12/30/2015  IR ANGIO INTRA EXTRACRAN SEL COM CAROTID INNOMINATE BILAT MOD SED 12/30/2015 Julieanne Cotton, MD [facility] . IR GENERIC HISTORICAL  12/30/2015  IR ANGIO VERTEBRAL SEL VERTEBRAL BILAT MOD SED 12/30/2015 Julieanne Cotton, MD [facility] . LOOP RECORDER INSERTION N/A 07/13/2017  Procedure: LOOP RECORDER INSERTION;  Surgeon: Hillis Range, MD;  Location: [facility] INVASIVE CV LAB;  Service: Cardiovascular;  Laterality: N/A; . TEE WITHOUT  CARDIOVERSION N/A 04/19/2015  Procedure: TRANSESOPHAGEAL ECHOCARDIOGRAM (TEE);  Surgeon: Jake Bathe, MD;  Location: [facility] ENDOSCOPY;  Service: Cardiovascular;  Laterality: N/A; .  VAGINAL HYSTERECTOMY   HPI: Pt. is a 52 y.o. female who was admitted to [facility] with Aphasia, RUE weakness, slurred speech, and difficulty with word finding. Pt. has a PMHx of CVA, TIA, DM, Headache, Hyperlipidemia, HTN, TIA, Appendectomy, Cholecystectomy, Hernia Repair. She went home with family and had worsening of speech and returned to [facility] ED. MRI showed new L frontal lobe infarcts compared to MRI on 07/09/17.  Subjective: pt pleasant and cooperative Assessment / Plan / Recommendation CHL IP CLINICAL IMPRESSIONS 07/22/2017 Clinical Impression Of note, this is pt's first instrumental study d/t intermittent coughing at bedside with thin liquids. Pt presents with severe oral phase dysphagia that is further completed by moderate cognitive deficits. Pt's oral phase is c/b no lingual movement, tongue pumping and multiple attempts to transfer bolus posteriorally. Despite Total A multimodal cues as well as right head turn and supraglottic swallow, pt is not able to decrease oral phase or increase timeliness of swallow initiation. Chin tuck not attempted d/t no ability to transfer bolus posteriorally when head in neutral alignment.  Pt is able to orally contain nectar thick liquids and honey thick liquids, however with nectar thick liquids pt's delayed swallow results in silent deep penetration to cords with residue eventually being silently aspirated. Pt is not able to orally contain thin liquids which results in premature spillage of bolus and moderate silent aspiration. Pt with delayed swallow initiation to valleculae with puree and honey thick liquids but no penetration or aspriation observed with these textures. At this time pt's is at a very high risk of aspiration. Given history of multiple CVAs, decreased recent progress, moderate silent  aspiraiton, moderate cognitive deficits, significantly decreased vocal intensity and significantly weak cough, I am recommend puree diet with honey thick liquids, medicince crushed in puree and recommend long term alternative means of nutrition such as PEG.  SLP Visit Diagnosis Dysphagia, oral phase (R13.11) Attention and concentration deficit following -- Frontal lobe and executive function deficit following -- Impact on safety and function Risk for inadequate nutrition/hydration;Severe aspiration risk   CHL IP TREATMENT RECOMMENDATION 07/22/2017 Treatment Recommendations Therapy as outlined in treatment plan below   No flowsheet data found. CHL IP DIET RECOMMENDATION 07/22/2017 SLP Diet Recommendations Alternative means - long-term;Dysphagia 1 (Puree) solids;Honey thick liquids Liquid Administration via Cup;Spoon Medication Administration Whole meds with puree Compensations Minimize environmental distractions;Lingual sweep for clearance of pocketing;Slow rate;Small sips/bites Postural Changes Seated upright at 90 degrees   CHL IP OTHER RECOMMENDATIONS 07/22/2017 Recommended Consults -- Oral Care Recommendations Oral care BID Other Recommendations Order thickener from pharmacy;Prohibited food (jello, ice cream, thin soups);Have oral suction available;Remove water pitcher   No flowsheet data found.  CHL IP FREQUENCY AND DURATION 07/10/2017 Speech Therapy Frequency (ACUTE ONLY) min 3x week Treatment Duration --      CHL IP ORAL PHASE 07/22/2017 Oral Phase Impaired Oral - Pudding Teaspoon -- Oral - Pudding Cup -- Oral - Honey Teaspoon Weak lingual manipulation;Right anterior bolus loss;Lingual pumping;Reduced posterior propulsion;Holding of bolus;Delayed oral transit Oral - Honey Cup Right anterior bolus loss;Weak lingual manipulation;Lingual pumping;Reduced posterior propulsion;Holding of bolus;Delayed oral transit Oral - Nectar Teaspoon Right anterior bolus loss;Weak lingual manipulation;Lingual pumping;Reduced posterior  propulsion;Delayed oral transit;Holding of bolus Oral - Nectar Cup Right anterior bolus loss;Weak lingual manipulation;Lingual pumping;Reduced posterior propulsion;Holding of bolus;Delayed oral transit Oral - Nectar Straw -- Oral - Thin Teaspoon Right anterior bolus loss;Weak lingual manipulation;Lingual pumping;Reduced posterior propulsion;Holding of bolus;Premature spillage;Decreased bolus cohesion;Delayed oral transit Oral - Thin Cup -- Oral - Thin Straw --  Oral - Puree Right anterior bolus loss;Weak lingual manipulation;Lingual pumping;Reduced posterior propulsion;Holding of bolus;Delayed oral transit;Decreased bolus cohesion;Premature spillage Oral - Mech Soft -- Oral - Regular -- Oral - Multi-Consistency -- Oral - Pill -- Oral Phase - Comment --  CHL IP PHARYNGEAL PHASE 07/22/2017 Pharyngeal Phase Impaired Pharyngeal- Pudding Teaspoon -- Pharyngeal -- Pharyngeal- Pudding Cup -- Pharyngeal -- Pharyngeal- Honey Teaspoon Delayed swallow initiation-vallecula Pharyngeal -- Pharyngeal- Honey Cup Delayed swallow initiation-vallecula;Reduced laryngeal elevation Pharyngeal -- Pharyngeal- Nectar Teaspoon Delayed swallow initiation-pyriform sinuses;Penetration/Aspiration before swallow;Penetration/Aspiration during swallow;Reduced laryngeal elevation;Reduced anterior laryngeal mobility Pharyngeal Material enters airway, passes BELOW cords without attempt by patient to eject out (silent aspiration) Pharyngeal- Nectar Cup Delayed swallow initiation-pyriform sinuses;Reduced anterior laryngeal mobility;Reduced laryngeal elevation;Penetration/Aspiration before swallow;Penetration/Aspiration during swallow Pharyngeal Material enters airway, passes BELOW cords without attempt by patient to eject out (silent aspiration) Pharyngeal- Nectar Straw -- Pharyngeal -- Pharyngeal- Thin Teaspoon Delayed swallow initiation-pyriform sinuses;Penetration/Aspiration before swallow;Penetration/Aspiration during swallow;Moderate  aspiration;Reduced laryngeal elevation;Reduced anterior laryngeal mobility Pharyngeal Material enters airway, passes BELOW cords without attempt by patient to eject out (silent aspiration) Pharyngeal- Thin Cup NT Pharyngeal -- Pharyngeal- Thin Straw -- Pharyngeal -- Pharyngeal- Puree Delayed swallow initiation-vallecula Pharyngeal -- Pharyngeal- Mechanical Soft -- Pharyngeal -- Pharyngeal- Regular -- Pharyngeal -- Pharyngeal- Multi-consistency -- Pharyngeal -- Pharyngeal- Pill -- Pharyngeal -- Pharyngeal Comment --  CHL IP CERVICAL ESOPHAGEAL PHASE 07/22/2017 Cervical Esophageal Phase WFL Pudding Teaspoon -- Pudding Cup -- Honey Teaspoon -- Honey Cup -- Nectar Teaspoon -- Nectar Cup -- Nectar Straw -- Thin Teaspoon -- Thin Cup -- Thin Straw -- Puree -- Mechanical Soft -- Regular -- Multi-consistency -- Pill -- Cervical Esophageal Comment -- No flowsheet data found. Happi Overton 07/22/2017, 12:04 PM              Results for orders placed or performed during the hospital encounter of 07/13/17 (from the past 72 hour(s))  Glucose, capillary     Status: None   Collection Time: 07/21/17 11:49 AM  Result Value Ref Range   Glucose-Capillary 90 65 - 99 mg/dL  Glucose, capillary     Status: Abnormal   Collection Time: 07/21/17  4:57 PM  Result Value Ref Range   Glucose-Capillary 134 (H) 65 - 99 mg/dL  Glucose, capillary     Status: Abnormal   Collection Time: 07/21/17  9:57 PM  Result Value Ref Range   Glucose-Capillary 118 (H) 65 - 99 mg/dL  Glucose, capillary     Status: Abnormal   Collection Time: 07/22/17  6:43 AM  Result Value Ref Range   Glucose-Capillary 110 (H) 65 - 99 mg/dL  Glucose, capillary     Status: Abnormal   Collection Time: 07/22/17 11:24 AM  Result Value Ref Range   Glucose-Capillary 110 (H) 65 - 99 mg/dL  Glucose, capillary     Status: Abnormal   Collection Time: 07/22/17  4:39 PM  Result Value Ref Range   Glucose-Capillary 119 (H) 65 - 99 mg/dL  Glucose, capillary     Status:  Abnormal   Collection Time: 07/22/17 10:06 PM  Result Value Ref Range   Glucose-Capillary 151 (H) 65 - 99 mg/dL  Glucose, capillary     Status: None   Collection Time: 07/23/17  7:00 AM  Result Value Ref Range   Glucose-Capillary 99 65 - 99 mg/dL   Comment 1 Notify RN   Glucose, capillary     Status: Abnormal   Collection Time: 07/23/17 11:40 AM  Result Value Ref Range   Glucose-Capillary 151 (H) 65 -  99 mg/dL  Glucose, capillary     Status: Abnormal   Collection Time: 07/23/17  4:38 PM  Result Value Ref Range   Glucose-Capillary 151 (H) 65 - 99 mg/dL  Glucose, capillary     Status: Abnormal   Collection Time: 07/23/17 10:02 PM  Result Value Ref Range   Glucose-Capillary 137 (H) 65 - 99 mg/dL   Comment 1 Notify RN   Glucose, capillary     Status: Abnormal   Collection Time: 07/24/17  8:57 AM  Result Value Ref Range   Glucose-Capillary 126 (H) 65 - 99 mg/dL     Constitutional: No distress . Vital signs reviewed. HEENT: EOMI, oral membranes moist Neck: supple Cardiovascular: RRR without murmur. No JVD    Respiratory: CTA Bilaterally without wheezes or rales. Normal effort    GI: BS +, non-tender, non-distended  Musc: No edema or tenderness in extremities. Skin:   Intact. Warm and dry. Neuro:  Alert Right facial droop persistent Alert/ oriented to person not time Motor: 4/5 RUE proximal to distal- stable 4+/5 LUE proximal to distal- stable RLE: 3-/5 HF, KE, ADF, 4/5 (stable) LLE: 3-/5 HF, KE, ADF stable Dysarthria Psych: flat but cooperative  Assessment/Plan: 1. 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 Physiatrist is providing close team supervision and 24 hour management of active medical problems listed below. Physiatrist and rehab  team continue to assess barriers to discharge/monitor patient progress toward functional and medical goals. FIM: Function - Bathing Bathing activity did not occur: Refused Position: Shower Body parts bathed by patient: Chest, Abdomen, Front perineal area, Right upper leg, Left upper leg, Right arm, Left arm Body parts bathed by helper: Right lower leg, Left lower leg, Back, Buttocks  Function- Upper Body Dressing/Undressing What is the patient wearing?: Pull over shirt/dress Pull over shirt/dress - Perfomed by patient: Thread/unthread left sleeve, Put head through opening, Pull shirt over trunk, Thread/unthread right sleeve Pull over shirt/dress - Perfomed by helper: Thread/unthread right sleeve Assist Level: Supervision or verbal cues Function - Lower Body Dressing/Undressing What is the patient wearing?: Pants, Socks, Shoes Position: Sitting EOB Underwear - Performed by helper: Thread/unthread right underwear leg, Thread/unthread left underwear leg, Pull underwear up/down Pants- Performed by patient: Thread/unthread right pants leg, Thread/unthread left pants leg Pants- Performed by helper: Pull pants up/down Non-skid slipper socks- Performed by helper: Don/doff right sock, Don/doff left sock Socks - Performed by helper: Don/doff right sock, Don/doff left sock Shoes - Performed by patient: Don/doff right shoe, Fasten right Shoes - Performed by helper: Don/doff left shoe, Fasten left, Don/doff right shoe, Fasten right TED Hose - Performed by helper: Don/doff right TED hose, Don/doff left TED hose Assist for footwear: Maximal assist Assist for lower body dressing: 2 Helpers  Function - Toileting Toileting steps completed by helper: Adjust clothing prior to toileting, Performs perineal hygiene, Adjust clothing after toileting Toileting Assistive Devices: Grab bar or rail  Function - Archivist transfer assistive device: Grab bar Assist level to toilet: Moderate assist (Pt  50 - 74%/lift or lower) Assist level from toilet: Moderate assist (Pt 50 - 74%/lift or lower) Assist level to bedside commode (at bedside): Moderate assist (Pt 50 - 74%/lift or lower)(per Leann Nickles-Wright, NT) Assist level from bedside commode (at bedside): Moderate assist (Pt 50 - 74%/lift or lower)  Function - Chair/bed  transfer Chair/bed transfer method: Ambulatory Chair/bed transfer assist level: Touching or steadying assistance (Pt > 75%) Chair/bed transfer assistive device: Armrests Chair/bed transfer details: Verbal cues for sequencing, Verbal cues for precautions/safety  Function - Locomotion: Wheelchair Will patient use wheelchair at discharge?: No Wheelchair activity did not occur: Safety/medical concerns Wheel 50 feet with 2 turns activity did not occur: Safety/medical concerns Wheel 150 feet activity did not occur: Safety/medical concerns Function - Locomotion: Ambulation Assistive device: No device Max distance: 50' Assist level: Touching or steadying assistance (Pt > 75%) Assist level: Touching or steadying assistance (Pt > 75%) Assist level: Touching or steadying assistance (Pt > 75%) Walk 150 feet activity did not occur: Safety/medical concerns Assist level: Moderate assist (Pt 50 - 74%) Assist level: Moderate assist (Pt 50 - 74%)  Function - Comprehension Comprehension: Auditory Comprehension assist level: Follows basic conversation/direction with extra time/assistive device  Function - Expression Expression: Verbal Expression assist level: Expresses basic 50 - 74% of the time/requires cueing 25 - 49% of the time. Needs to repeat parts of sentences.  Function - Social Interaction Social Interaction assist level: Interacts appropriately 75 - 89% of the time - Needs redirection for appropriate language or to initiate interaction.  Function - Problem Solving Problem solving assist level: Solves basic 50 - 74% of the time/requires cueing 25 - 49% of the  time  Function - Memory Memory assist level: Recognizes or recalls 50 - 74% of the time/requires cueing 25 - 49% of the time Patient normally able to recall (first 3 days only): Current season, That he or she is in a hospital, Location of own room   Medical Problem List and Plan:  1. Right hemiparesis and functional deficits secondary to left periventricular white matter and right frontal lobe infarcts    Cont CIR -  PT, OT, SLP  2. DVT Prophylaxis/Anticoagulation: Pharmaceutical: Lovenox  q 24h 3. Chronic HA/Pain Management: Depakote bid with tylenol prn.  Lower extremity paresthesias  Trial of Cymbalta 4. Mood: LCSW to follow for evaluation and support.    Emotional lability trial of Cymbalta.    If this is not helpful may consider SSRI 5. Neuropsych: This patient is not fully capable of making decisions on her own behalf.  6. Skin/Wound Care: routine pressure relief measures.  7. Fluids/Electrolytes/Nutrition: Monitor I/Os  8. 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 Monitor BS ac/hs. Educate on importance of compliance.  CBG (last 3)  Recent Labs    07/23/17 1638 07/23/17 2202 07/24/17 0857  GLUCAP 151* 137* 126*    controlled on 5/11 9. Dyslipidemia: On Crestor.  10. Anemia: Hgb stable at 9.5 on 5/1, improved to 10.4 on 5/7 11. HTN with orthostasis- will check q shift, no syncope x 3 d   Monitor BP bid.   -no orthostasis today  -bp's getting a little too high now  -lisinopril is  bid  -add low dose metoprolol 6.25mg  bid Vitals:   07/23/17 1943 07/24/17 0631  BP: (!) 190/74 (!) 189/83  Pulse: 80 76  Resp:  16  Temp:  98 F (36.7 C)  SpO2: 100% 98%   Ordered TEDs  Abdominal binder    Continue to monitor- IVF per Cardiology 12. Hypoalbuminemia  Supplement initiated on 5/4 13.  AMS probable seizure,CT neg forhemmorhagic transformation, EEG neg for seizure , slowing noted,   On Depakene, reviewed Neuro outpt note, possible seizure in 2017 , has also been  treated for migraines per outpt Neuro Dr Everlena Cooper, added Keppra to Valproate depakene 14.  Mild abd tenderness improved KUB neg 15.  Dysphagia moderately severe- supplement fluids with IV at noc  -had long discussion with pt and family today re: PEG  -discussed pros and cons of placement  -they have decided that they would like to proceed with placement  -will hold plavix in expectation of placement next week.        LOS (Days) 11 A FACE TO FACE EVALUATION WAS PERFORMED  Ranelle Oyster 07/24/2017, 9:30 AM

## 2017-07-25 ENCOUNTER — Inpatient Hospital Stay (HOSPITAL_COMMUNITY): Payer: Medicaid Other | Admitting: Occupational Therapy

## 2017-07-25 LAB — GLUCOSE, CAPILLARY
GLUCOSE-CAPILLARY: 90 mg/dL (ref 65–99)
GLUCOSE-CAPILLARY: 127 mg/dL — AB (ref 65–99)
Glucose-Capillary: 119 mg/dL — ABNORMAL HIGH (ref 65–99)
Glucose-Capillary: 189 mg/dL — ABNORMAL HIGH (ref 65–99)

## 2017-07-25 NOTE — Progress Notes (Signed)
PMT consult received. Chart reviewed. Plan is for PEG tube placement this week. Voicemail left for [name] Jochem. Awaiting return call. PMT will follow and attempt to further discuss GOC with patient/family and answer questions/concerns regarding feeding tube placement.   NO CHARGE  Vennie Homans, FNP-C Palliative Medicine Team  Phone: [phone] Fax: [phone]

## 2017-07-25 NOTE — Progress Notes (Signed)
Occupational Therapy Session Note  Patient Details  Name: SUDA FORBESS MRN: [id number] Date of Birth: 1965/07/19  Today's Date: 07/25/2017 OT Individual Time: 4098-1191 and 4782-9562 OT Individual Time Calculation (min): 40 min and 58 min  Skilled Therapeutic Interventions/Progress Updates:    Pt greeted supine in bed with family present. Agreeable to makeup therapy time with family involvement. BP at start of session 146/60 with pt asymptomatic. Worked on [employer], Rt attention, and orientation via Mother's Day coloring page activity. Mod A for supine<sit EOB. Pt requiring max HOH for initiation and attention to task throughout session in moderately stimulating environment with her grandchildren (also coloring beside her). She was able to hold crayon with quadrupod grasp pattern and tell therapist which color she wanted to use. Assist required for retrieving crayons from small package due to slow speed and coordination deficits. Family and OT orienting pt to Mother's Day throughout with pts affect visibly brightened by being with her grandchildren. At end of session pt returned to supine and was left with NTs for changing bed.   2nd Session 1:1 tx (58 min) Pt greeted supine in bed with family still present. BP 159/62. Continued with coloring page activity for Rt NMR and social participation/family time. Pt exhibited poor carryover of learning from this AM, and continued to require [facility] assist for initiation and max cues for attention. Pt also with intermittent posterior LOBs and required Mod A to correct. Per dtr, being present in therapy today was eye-opening, as one year ago, her mother (pt) was organizing these activities for the grandchildren with ease. After discussion with RN, OT provided emotional support and encouragement in regards to PEG tube placement. Pt is still apprehensive/nervous, and cried while we conversed. OT emphasized that PEG will help improve medical stability so she can get  stronger and go home. Dtr present and provided encouragement also. Pt appeared receptive to education and support. Mod A for sit<supine with pt pulling herself up in bed using Lt side of body. She was repositioned for comfort and left with all needs and bed alarm set. Family also present.   Therapy Documentation Precautions:  Precautions Precautions: Fall Restrictions Weight Bearing Restrictions: No Pain: Pain Assessment Faces Pain Scale: No hurt ADL:    See Function Navigator for Current Functional Status.   Therapy/Group: Individual Therapy  Ravon Mcilhenny A Pacey Willadsen 07/25/2017, 12:28 PM

## 2017-07-25 NOTE — Plan of Care (Signed)
  Problem: Consults Goal: RH STROKE PATIENT EDUCATION Description See Patient Education module for education specifics  Outcome: Progressing   Problem: RH BOWEL ELIMINATION Goal: RH STG MANAGE BOWEL WITH ASSISTANCE Description STG Manage Bowel with Assistance. Outcome: Progressing   Problem: RH BLADDER ELIMINATION Goal: RH STG MANAGE BLADDER WITH ASSISTANCE Description STG Manage Bladder With min. Assistance  Outcome: Progressing   Problem: RH SKIN INTEGRITY Goal: RH STG SKIN FREE OF INFECTION/BREAKDOWN Description With min. assist.  Outcome: Progressing Goal: RH STG MAINTAIN SKIN INTEGRITY WITH ASSISTANCE Description STG Maintain Skin Integrity With min. Assistance.  Outcome: Progressing   Problem: RH SAFETY Goal: RH STG ADHERE TO SAFETY PRECAUTIONS W/ASSISTANCE/DEVICE Description STG Adhere to Safety Precautions With mod.Assistance/Device.  Outcome: Progressing   Problem: RH PAIN MANAGEMENT Goal: RH STG PAIN MANAGED AT OR BELOW PT'S PAIN GOAL Description Less than 3.  Outcome: Progressing   Problem: RH KNOWLEDGE DEFICIT Goal: RH STG INCREASE KNOWLEDGE OF DIABETES Outcome: Progressing Goal: RH STG INCREASE KNOWLEDGE OF HYPERTENSION Outcome: Progressing Goal: RH STG INCREASE KNOWLEDGE OF DYSPHAGIA/FLUID INTAKE Outcome: Progressing

## 2017-07-25 NOTE — Progress Notes (Signed)
Subjective/Complaints:  No problems overnight.  Fairly uneventful evening.  ROS: Patient denies fever, rash, sore throat, blurred vision, nausea, vomiting, diarrhea, cough, shortness of breath or chest pain, joint or back pain, headache, or mood change.    Objective: Vital Signs: Blood pressure (!) 184/82, pulse 71, temperature 98.6 F (37 C), temperature source Oral, resp. rate 18, height  (1.651 m), weight 95.4 kg (210 lb 5.1 oz), SpO2 98 %. Ct Abdomen Wo Contrast  Result Date: 07/24/2017 CLINICAL DATA:  Evaluate anatomy prior to potential percutaneous gastrostomy tube placement. EXAM: CT ABDOMEN WITHOUT CONTRAST TECHNIQUE: Multidetector CT imaging of the abdomen was performed following the standard protocol without IV contrast. COMPARISON:  CT abdomen and pelvis-05/03/2014 FINDINGS: Lower chest: Limited visualization of the lower thorax demonstrates minimal dependent subpleural ground-glass atelectasis. No discrete focal airspace opacities. Heart size. No pericardial effusion. Calcifications of the mitral valve annulus. Hepatobiliary: Normal hepatic contour. Post cholecystectomy. No ascites. Pancreas: Partial fatty replacement of the pancreas. Spleen: Normal noncontrast appearance the spleen Adrenals/Urinary Tract: Punctate (approximately 4 mm) nonobstructing left-sided renal stone. No evidence of right-sided nephrolithiasis. No urine obstruction or perinephric stranding. Normal noncontrast appearance the bilateral adrenal glands. Urinary bladder was not imaged. Stomach/Bowel: Normal orientation of the stomach without interposed liver or colon between the anterior wall the stomach and ventral wall the abdomen. Enteric contrast is seen throughout the colon. No evidence of enteric obstruction. Vascular/Lymphatic: Minimal amount of atherosclerotic plaque with a normal caliber abdominal aorta. No bulky porta hepatis, retroperitoneal mesenteric adenopathy on this noncontrast examination. Other:  Post ventral abdominal wall hernia repair without evidence of recurrence within the imaged segment. Note, the cranial aspect of the surgical mesh appears caudal to the expected location of the insertion site of the gastrostomy tube. Musculoskeletal: No acute or aggressive osseous abnormalities. Stigmata of DISH within the lower thoracic spine. IMPRESSION: 1. Gastric anatomy amenable to potential percutaneous gastrostomy tube placement as indicated. 2. Post mesh repair of the ventral aspect of the abdomen, however the cranial aspect of the surgical mesh appears caudal to the expected insertion site of the gastrostomy tube. 3. Punctate (approximately 4 mm) nonobstructing left-sided renal stone, similar to the 2/16 examination. 4.  Aortic Atherosclerosis (ICD10-I70.0). Electronically Signed   By: Simonne Come M.D.   On: 07/24/2017 08:53   Results for orders placed or performed during the hospital encounter of 07/13/17 (from the past 72 hour(s))  Glucose, capillary     Status: Abnormal   Collection Time: 07/22/17 11:24 AM  Result Value Ref Range   Glucose-Capillary 110 (H) 65 - 99 mg/dL  Glucose, capillary     Status: Abnormal   Collection Time: 07/22/17  4:39 PM  Result Value Ref Range   Glucose-Capillary 119 (H) 65 - 99 mg/dL  Glucose, capillary     Status: Abnormal   Collection Time: 07/22/17 10:06 PM  Result Value Ref Range   Glucose-Capillary 151 (H) 65 - 99 mg/dL  Glucose, capillary     Status: None   Collection Time: 07/23/17  7:00 AM  Result Value Ref Range   Glucose-Capillary 99 65 - 99 mg/dL   Comment 1 Notify RN   Glucose, capillary     Status: Abnormal   Collection Time: 07/23/17 11:40 AM  Result Value Ref Range   Glucose-Capillary 151 (H) 65 - 99 mg/dL  Glucose, capillary     Status: Abnormal   Collection Time: 07/23/17  4:38 PM  Result Value Ref Range   Glucose-Capillary 151 (H) 65 - 99  mg/dL  Glucose, capillary     Status: Abnormal   Collection Time: 07/23/17 10:02 PM  Result  Value Ref Range   Glucose-Capillary 137 (H) 65 - 99 mg/dL   Comment 1 Notify RN   Glucose, capillary     Status: Abnormal   Collection Time: 07/24/17  8:57 AM  Result Value Ref Range   Glucose-Capillary 126 (H) 65 - 99 mg/dL  Glucose, capillary     Status: Abnormal   Collection Time: 07/24/17 11:32 AM  Result Value Ref Range   Glucose-Capillary 141 (H) 65 - 99 mg/dL  Glucose, capillary     Status: Abnormal   Collection Time: 07/24/17  5:04 PM  Result Value Ref Range   Glucose-Capillary 109 (H) 65 - 99 mg/dL  Glucose, capillary     Status: None   Collection Time: 07/24/17  9:57 PM  Result Value Ref Range   Glucose-Capillary 91 65 - 99 mg/dL  Glucose, capillary     Status: None   Collection Time: 07/25/17  6:47 AM  Result Value Ref Range   Glucose-Capillary 90 65 - 99 mg/dL     Constitutional: No distress . Vital signs reviewed. HEENT: EOMI, oral membranes moist Neck: supple Cardiovascular: RRR without murmur. No JVD    Respiratory: CTA Bilaterally without wheezes or rales. Normal effort    GI: BS +, non-tender, non-distended  Musc: No edema or tenderness in extremities. Skin:   Intact. Warm and dry.    Neuro:  Alert Right facial droop persistent Alert/ oriented to person not time Motor: 4/5 RUE proximal to distal- stable 4+/5 LUE proximal to distal- stable RLE: 3-/5 HF, KE, ADF, 4/5 (stable) LLE: 3-/5 HF, KE, ADF stable Dysarthria Psych: f flat    Assessment/Plan: 1. 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 Physiatrist is providing close team supervision and 24 hour management of active medical problems listed below. Physiatrist and rehab team continue to assess barriers to discharge/monitor patient progress toward functional and  medical goals. FIM: Function - Bathing Bathing activity did not occur: Refused Position: Shower Body parts bathed by patient: Chest, Abdomen, Front perineal area, Right upper leg, Left upper leg, Right arm, Left arm Body parts bathed by helper: Right lower leg, Left lower leg, Back, Buttocks  Function- Upper Body Dressing/Undressing What is the patient wearing?: Pull over shirt/dress Pull over shirt/dress - Perfomed by patient: Thread/unthread left sleeve, Put head through opening, Pull shirt over trunk, Thread/unthread right sleeve Pull over shirt/dress - Perfomed by helper: Thread/unthread right sleeve Assist Level: Supervision or verbal cues Function - Lower Body Dressing/Undressing What is the patient wearing?: Pants, Socks, Shoes Position: Sitting EOB Underwear - Performed by helper: Thread/unthread right underwear leg, Thread/unthread left underwear leg, Pull underwear up/down Pants- Performed by patient: Thread/unthread right pants leg, Thread/unthread left pants leg Pants- Performed by helper: Pull pants up/down Non-skid slipper socks- Performed by helper: Don/doff right sock, Don/doff left sock Socks - Performed by helper: Don/doff right sock, Don/doff left sock Shoes - Performed by patient: Don/doff right shoe, Fasten right Shoes - Performed by helper: Don/doff left shoe, Fasten left, Don/doff right shoe, Fasten right TED Hose - Performed by helper: Don/doff right TED hose, Don/doff left TED hose Assist for footwear: Maximal assist Assist for lower body dressing: 2 Helpers  Function - Toileting Toileting steps completed  by patient: Performs perineal hygiene, Adjust clothing prior to toileting Toileting steps completed by helper: Adjust clothing prior to toileting, Adjust clothing after toileting Toileting Assistive Devices: Grab bar or rail, Other (comment)(RW)  Function - Toilet Transfers Toilet transfer assistive device: Elevated toilet seat/BSC over toilet,  Walker Assist level to toilet: Touching or steadying assistance (Pt > 75%) Assist level from toilet: Touching or steadying assistance (Pt > 75%) Assist level to bedside commode (at bedside): Moderate assist (Pt 50 - 74%/lift or lower)(per Leann Nickles-Wright, NT) Assist level from bedside commode (at bedside): Moderate assist (Pt 50 - 74%/lift or lower)  Function - Chair/bed transfer Chair/bed transfer method: Ambulatory Chair/bed transfer assist level: Touching or steadying assistance (Pt > 75%) Chair/bed transfer assistive device: Armrests Chair/bed transfer details: Verbal cues for sequencing, Verbal cues for precautions/safety  Function - Locomotion: Wheelchair Will patient use wheelchair at discharge?: No Wheelchair activity did not occur: Safety/medical concerns Wheel 50 feet with 2 turns activity did not occur: Safety/medical concerns Wheel 150 feet activity did not occur: Safety/medical concerns Function - Locomotion: Ambulation Assistive device: Walker-rolling Max distance: 10 ft Assist level: Touching or steadying assistance (Pt > 75%) Assist level: Touching or steadying assistance (Pt > 75%) Assist level: Touching or steadying assistance (Pt > 75%) Walk 150 feet activity did not occur: Safety/medical concerns Assist level: Moderate assist (Pt 50 - 74%) Assist level: Moderate assist (Pt 50 - 74%)  Function - Comprehension Comprehension: Auditory Comprehension assist level: Follows basic conversation/direction with extra time/assistive device  Function - Expression Expression: Verbal Expression assist level: Expresses basic 50 - 74% of the time/requires cueing 25 - 49% of the time. Needs to repeat parts of sentences.  Function - Social Interaction Social Interaction assist level: Interacts appropriately 75 - 89% of the time - Needs redirection for appropriate language or to initiate interaction.  Function - Problem Solving Problem solving assist level: Solves basic 50  - 74% of the time/requires cueing 25 - 49% of the time  Function - Memory Memory assist level: Recognizes or recalls 50 - 74% of the time/requires cueing 25 - 49% of the time Patient normally able to recall (first 3 days only): Current season, That he or she is in a hospital, Location of own room   Medical Problem List and Plan:  1. Right hemiparesis and functional deficits secondary to left periventricular white matter and right frontal lobe infarcts    Cont CIR -  PT, OT, SLP  2. DVT Prophylaxis/Anticoagulation: Pharmaceutical: Lovenox  q 24h 3. Chronic HA/Pain Management: Depakote bid with tylenol prn.  Lower extremity paresthesias  Trial of Cymbalta 4. Mood: LCSW to follow for evaluation and support.    Emotional lability trial of Cymbalta.    If this is not helpful may consider SSRI 5. Neuropsych: This patient is not fully capable of making decisions on her own behalf.  6. Skin/Wound Care: routine pressure relief measures.  7. Fluids/Electrolytes/Nutrition: Monitor I/Os  8. 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 Monitor BS ac/hs. Educate on importance of compliance.  CBG (last 3)  Recent Labs    07/24/17 1704 07/24/17 2157 07/25/17 0647  GLUCAP 109* 91 90    controlled on 5/12 9. Dyslipidemia: On Crestor.  10. Anemia: Hgb stable at 9.5 on 5/1, improved to 10.4 on 5/7 11. HTN with orthostasis- will check q shift, no syncope x 3 d   Monitor BP bid.   -no  orthostasis today  -bp's have been trending too high  -lisinopril is  bid  -added low dose metoprolol 12.5mg  5/11 with some improvement Vitals:   07/24/17 2110 07/25/17 0443  BP: (!) 181/75 (!) 184/82  Pulse: 80 71  Resp: 18 18  Temp: 97.6 F (36.4 C) 98.6 F (37 C)  SpO2: 99% 98%   Ordered TEDs  Abdominal binder    Continue to monitor- IVF per Cardiology 12. Hypoalbuminemia  Supplement initiated on 5/4 13.  AMS  probable seizure,CT neg forhemmorhagic transformation, EEG neg for seizure , slowing noted,  On Depakene, reviewed Neuro outpt note, possible seizure in 2017 , has also been treated for migraines per outpt Neuro Dr Everlena Cooper, added Keppra to Valproate depakene 14.  Mild abd tenderness improved KUB neg 15.  Dysphagia moderately severe- supplement fluids with IV at noc  -had long discussion with pt and family 5/11 re: PEG  -discussed pros and cons of placement  -they have decided that they would like to proceed with placement  -have held plavix (last dose received 5/11) in expectation of placement this week 5/16 or 5/17        LOS (Days) 12 A FACE TO FACE EVALUATION WAS PERFORMED  Ranelle Oyster 07/25/2017, 8:52 AM

## 2017-07-26 ENCOUNTER — Inpatient Hospital Stay (HOSPITAL_COMMUNITY): Payer: Medicaid Other

## 2017-07-26 ENCOUNTER — Inpatient Hospital Stay (HOSPITAL_COMMUNITY): Payer: Medicaid Other | Admitting: Occupational Therapy

## 2017-07-26 DIAGNOSIS — I69315 Cognitive social or emotional deficit following cerebral infarction: Secondary | ICD-10-CM

## 2017-07-26 LAB — GLUCOSE, CAPILLARY
GLUCOSE-CAPILLARY: 122 mg/dL — AB (ref 65–99)
GLUCOSE-CAPILLARY: 106 mg/dL — AB (ref 65–99)
Glucose-Capillary: 188 mg/dL — ABNORMAL HIGH (ref 65–99)
Glucose-Capillary: 139 mg/dL — ABNORMAL HIGH (ref 65–99)

## 2017-07-26 NOTE — Progress Notes (Signed)
Pt refused some of morning meds, mixed with choc pudding which pt likes but states "Im done. I dont want no more." Explained importantance of taking meds. Pt stated "I know but no more" . MD is aware pt been refusing meds.

## 2017-07-26 NOTE — Progress Notes (Signed)
Occupational Therapy Session Note  Patient Details  Name: Kelly Romero MRN: [id number] Date of Birth: [birth year]  Today's Date: 07/26/2017 OT Individual Time: 4098-1191 OT Individual Time Calculation (min): 72 min    Short Term Goals: Week 2:  OT Short Term Goal 1 (Week 2): Pt will ambulate into bathroom with CGA using LRAD. OT Short Term Goal 2 (Week 2): Pt will complete 2 grooming tasks standing at sink with close supervision in order to increase functional activity tolerance OT Short Term Goal 3 (Week 2): Pt will complete 3/3 toileting tasks with guarding assist OT Short Term Goal 4 (Week 2): Pt will use R UE at stabilizer level mod I  Skilled Therapeutic Interventions/Progress Updates:    Pt seen for OT session focusing on ADL re-training. Pt asleep in supine upon arrival, easily awoken and agreeable to tx session. She transferred to EOB with min A using hospital bed functions. She ate breakfast seated EOB, used L UE to self feed, demonstrating slow and apraxic movements in L UE as well as R. Pt able to functioanlly grasp cup with R UE and bring to mouth, min A required for tilting back of cup in order to move thickened liquid to mouth. She dressed seated EOB, able to thread B LEs into pants and dressed UB with significantly increaed time and mod cuing for clothing orientation. Pt able to manipulate clothing items with B hands for set-up.  With TED hose donned, She stood from EOB with mod A to RW, pt tolerating ~1 minute in standing while pericare and buttock hygiene completed total A. She was unable to stand long enough for clothing management to be completed or full BP to be obtained. Upon returning to sitting EOB BP 141/65. Extended seated rest break provided and then pt stood again and with +2 help to decrease extended standing need and for safety, clothing management completed total A and pt compelted stand pivot to w/c. Pt left seated in w/c with hand off to NT.   Therapy  Documentation Precautions:  Precautions Precautions: Fall Restrictions Weight Bearing Restrictions: No Pain:   No/denies pain  See Function Navigator for Current Functional Status.   Therapy/Group: Individual Therapy  Alicja Everitt L 07/26/2017, 7:09 AM

## 2017-07-26 NOTE — Plan of Care (Signed)
  Problem: Consults Goal: RH STROKE PATIENT EDUCATION Description See Patient Education module for education specifics  Outcome: Progressing   Problem: RH BOWEL ELIMINATION Goal: RH STG MANAGE BOWEL WITH ASSISTANCE Description STG Manage Bowel with mod Assistance.  Outcome: Progressing   Problem: RH SKIN INTEGRITY Goal: RH STG SKIN FREE OF INFECTION/BREAKDOWN Description With min. assist.  Outcome: Progressing Goal: RH STG MAINTAIN SKIN INTEGRITY WITH ASSISTANCE Description STG Maintain Skin Integrity With min. Assistance.  Outcome: Progressing   Problem: RH SAFETY Goal: RH STG ADHERE TO SAFETY PRECAUTIONS W/ASSISTANCE/DEVICE Description STG Adhere to Safety Precautions With mod.Assistance/Device.  Outcome: Progressing   Problem: RH PAIN MANAGEMENT Goal: RH STG PAIN MANAGED AT OR BELOW PT'S PAIN GOAL Description Less than 3.  Outcome: Progressing   Problem: RH BLADDER ELIMINATION Goal: RH STG MANAGE BLADDER WITH ASSISTANCE Description STG Manage Bladder With min. Assistance  Outcome: Not Progressing   Problem: RH KNOWLEDGE DEFICIT Goal: RH STG INCREASE KNOWLEDGE OF DIABETES Outcome: Not Progressing Goal: RH STG INCREASE KNOWLEDGE OF HYPERTENSION Outcome: Not Progressing Goal: RH STG INCREASE KNOWLEDGE OF DYSPHAGIA/FLUID INTAKE Outcome: Not Progressing   Patient continues to refuse some medications stating she doesn't need them anymore.  She is also mostly incontinent requiring max assist to change diaper.  Dani Gobble, RN

## 2017-07-26 NOTE — Progress Notes (Signed)
Speech Language Pathology Daily Session Note  Patient Details  Name: Kelly Romero MRN: [id number] Date of Birth: Nov 29, 1965  Today's Date: 07/26/2017 SLP Individual Time: 1000-1100 SLP Individual Time Calculation (min): 60 min  Short Term Goals: Week 2: SLP Short Term Goal 1 (Week 2): Pt will utilize word finding strategies to convey basic thoughts and ideas with Min A cues.  SLP Short Term Goal 2 (Week 2): Pt will utilize speech intelligibility strategies with Min A  to increase speech intelligibility to ~ 90% at the word level.  SLP Short Term Goal 3 (Week 2): Pt will solve semi-complex problem solving tasks with Mod A cues.  SLP Short Term Goal 4 (Week 2): Pt will demonstrate selective attention in moderately distracting environment for 30 minutes iwth Mod A cues.  SLP Short Term Goal 5 (Week 2): Pt will consume puree with honey thick liquids and minimal s/s of aspiration and Mod A for useof compensatory swallow strategies.  SLP Short Term Goal 5 - Progress (Week 2): Revised due to lack of progress  Skilled Therapeutic Interventions:Skilled ST services focused on swallow and cognitive skills. SLP facilitated PO consumption of HTL, pt demonstrated Mod I use of swallow strategies and no overt s/s aspiration. Pt demonstrated recall of PEG tube placement and SLP explained the importance of continued PO trials to maintain swallow function along with PEG for nurtion, pt stated agreement. SLP facilitated mildly complex problem solving utilizing basic money management requiring total-max A verbal cues,  3-4 step sequence cards pt demonstrated Mod I with extra time for delayed processing and min A verbal cues for 5 step cards. Although, verbal pt required mod A and extra time to describe pictures in order. Pt required min A verbal cues at word level and Max A verbal cues at phrase level to utilize speech intelligibility strategies. t was left in room with call bell within reach. Recommend to continue  skilled ST services.      Function:  Eating Eating   Modified Consistency Diet: Yes Eating Assist Level: Supervision or verbal cues;Set up assist for           Cognition Comprehension Comprehension assist level: Follows basic conversation/direction with extra time/assistive device  Expression   Expression assist level: Expresses basic 25 - 49% of the time/requires cueing 50 - 75% of the time. Uses single words/gestures.;Expresses basic 75 - 89% of the time/requires cueing 10 - 24% of the time. Needs helper to occlude trach/needs to repeat words.  Social Interaction Social Interaction assist level: Interacts appropriately 75 - 89% of the time - Needs redirection for appropriate language or to initiate interaction.  Problem Solving Problem solving assist level: Solves basic 75 - 89% of the time/requires cueing 10 - 24% of the time  Memory Memory assist level: Recognizes or recalls 50 - 74% of the time/requires cueing 25 - 49% of the time    Pain Pain Assessment Pain Score: 0-No pain  Therapy/Group: Individual Therapy  Kabria Hetzer  [facility] 07/26/2017, 1:47 PM

## 2017-07-26 NOTE — Progress Notes (Signed)
Subjective/Complaints:  Some dizziness with standing but no mental status changes sys BPs !20s-140s  ROS: Limited by severe dysarthria and cognition   Objective: Vital Signs: Blood pressure (!) 182/74, pulse 72, temperature 98.1 F (36.7 C), temperature source Oral, resp. rate 16, height  (1.651 m), weight 95.4 kg (210 lb 5.1 oz), SpO2 95 %. No results found. Results for orders placed or performed during the hospital encounter of 07/13/17 (from the past 72 hour(s))  Glucose, capillary     Status: Abnormal   Collection Time: 07/23/17 11:40 AM  Result Value Ref Range   Glucose-Capillary 151 (H) 65 - 99 mg/dL  Glucose, capillary     Status: Abnormal   Collection Time: 07/23/17  4:38 PM  Result Value Ref Range   Glucose-Capillary 151 (H) 65 - 99 mg/dL  Glucose, capillary     Status: Abnormal   Collection Time: 07/23/17 10:02 PM  Result Value Ref Range   Glucose-Capillary 137 (H) 65 - 99 mg/dL   Comment 1 Notify RN   Glucose, capillary     Status: Abnormal   Collection Time: 07/24/17  8:57 AM  Result Value Ref Range   Glucose-Capillary 126 (H) 65 - 99 mg/dL  Glucose, capillary     Status: Abnormal   Collection Time: 07/24/17 11:32 AM  Result Value Ref Range   Glucose-Capillary 141 (H) 65 - 99 mg/dL  Glucose, capillary     Status: Abnormal   Collection Time: 07/24/17  5:04 PM  Result Value Ref Range   Glucose-Capillary 109 (H) 65 - 99 mg/dL  Glucose, capillary     Status: None   Collection Time: 07/24/17  9:57 PM  Result Value Ref Range   Glucose-Capillary 91 65 - 99 mg/dL  Glucose, capillary     Status: None   Collection Time: 07/25/17  6:47 AM  Result Value Ref Range   Glucose-Capillary 90 65 - 99 mg/dL  Glucose, capillary     Status: Abnormal   Collection Time: 07/25/17 11:35 AM  Result Value Ref Range   Glucose-Capillary 127 (H) 65 - 99 mg/dL  Glucose, capillary     Status: Abnormal   Collection Time: 07/25/17  4:41 PM  Result Value Ref Range   Glucose-Capillary 119 (H) 65 - 99 mg/dL  Glucose, capillary     Status: Abnormal   Collection Time: 07/25/17  9:03 PM  Result Value Ref Range   Glucose-Capillary 189 (H) 65 - 99 mg/dL  Glucose, capillary     Status: Abnormal   Collection Time: 07/26/17  6:20 AM  Result Value Ref Range   Glucose-Capillary 122 (H) 65 - 99 mg/dL     Constitutional: No distress . Vital signs reviewed. HEENT: EOMI, oral membranes moist Neck: supple Cardiovascular: RRR without murmur. No JVD    Respiratory: CTA Bilaterally without wheezes or rales. Normal effort    GI: BS +, non-tender, non-distended  Musc: No edema or tenderness in extremities. Skin:   Intact. Warm and dry.    Neuro:  Alert Right facial droop persistent Alert/ oriented to person not time Motor: 4/5 RUE proximal to distal- stable 4+/5 LUE proximal to distal- stable RLE: 3-/5 HF, KE, ADF, 4/5 (stable) LLE: 3-/5 HF, KE, ADF stable Dysarthria Psych: f flat    Assessment/Plan: 1. 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  Physiatrist is providing close team supervision and 24 hour management of active medical problems listed below. Physiatrist and rehab team continue to assess barriers to discharge/monitor patient progress toward functional and medical goals. FIM: Function - Bathing Bathing activity did not occur: Refused Position: Shower Body parts bathed by patient: Chest, Abdomen, Front perineal area, Right upper leg, Left upper leg, Right arm, Left arm Body parts bathed by helper: Right lower leg, Left lower leg, Back, Buttocks  Function- Upper Body Dressing/Undressing What is the patient wearing?: Pull over shirt/dress Pull over shirt/dress - Perfomed by patient: Thread/unthread left sleeve,  Put head through opening, Pull shirt over trunk, Thread/unthread right sleeve Pull over shirt/dress - Perfomed by helper: Thread/unthread right sleeve Assist Level: Supervision or verbal cues Function - Lower Body Dressing/Undressing What is the patient wearing?: Pants, Socks, Shoes Position: Sitting EOB Underwear - Performed by helper: Thread/unthread right underwear leg, Thread/unthread left underwear leg, Pull underwear up/down Pants- Performed by patient: Thread/unthread right pants leg, Thread/unthread left pants leg Pants- Performed by helper: Pull pants up/down Non-skid slipper socks- Performed by helper: Don/doff right sock, Don/doff left sock Socks - Performed by helper: Don/doff right sock, Don/doff left sock Shoes - Performed by patient: Don/doff right shoe, Fasten right Shoes - Performed by helper: Don/doff left shoe, Fasten left, Don/doff right shoe, Fasten right TED Hose - Performed by helper: Don/doff right TED hose, Don/doff left TED hose Assist for footwear: Maximal assist Assist for lower body dressing: 2 Helpers  Function - Toileting Toileting steps completed by patient: Performs perineal hygiene, Adjust clothing prior to toileting Toileting steps completed by helper: Adjust clothing prior to toileting, Adjust clothing after toileting Toileting Assistive Devices: Grab bar or rail, Other (comment)(RW)  Function - Archivist transfer assistive device: Elevated toilet seat/BSC over toilet, Walker Assist level to toilet: Touching or steadying assistance (Pt > 75%) Assist level from toilet: Touching or steadying assistance (Pt > 75%) Assist level to bedside commode (at bedside): Moderate assist (Pt 50 - 74%/lift or lower)(per Leann Nickles-Wright, NT) Assist level from bedside commode (at bedside): Moderate assist (Pt 50 - 74%/lift or lower)  Function - Chair/bed transfer Chair/bed transfer method: Ambulatory Chair/bed transfer assist level: Touching or  steadying assistance (Pt > 75%) Chair/bed transfer assistive device: Armrests Chair/bed transfer details: Verbal cues for sequencing, Verbal cues for precautions/safety  Function - Locomotion: Wheelchair Will patient use wheelchair at discharge?: No Wheelchair activity did not occur: Safety/medical concerns Wheel 50 feet with 2 turns activity did not occur: Safety/medical concerns Wheel 150 feet activity did not occur: Safety/medical concerns Function - Locomotion: Ambulation Assistive device: Walker-rolling Max distance: 10 ft Assist level: Touching or steadying assistance (Pt > 75%) Assist level: Touching or steadying assistance (Pt > 75%) Assist level: Touching or steadying assistance (Pt > 75%) Walk 150 feet activity did not occur: Safety/medical concerns Assist level: Moderate assist (Pt 50 - 74%) Assist level: Moderate assist (Pt 50 - 74%)  Function - Comprehension Comprehension: Auditory Comprehension assist level: Follows basic conversation/direction with extra time/assistive device  Function - Expression Expression: Verbal Expression assist level: Expresses basic 50 - 74% of the time/requires cueing 25 - 49% of the time. Needs to repeat parts of sentences.  Function - Social Interaction Social Interaction assist level: Interacts appropriately 75 - 89% of the time - Needs redirection for appropriate language or to initiate interaction.  Function - Problem Solving Problem solving assist level: Solves basic 50 - 74% of the time/requires cueing 25 - 49% of the  time  Function - Memory Memory assist level: Recognizes or recalls 50 - 74% of the time/requires cueing 25 - 49% of the time Patient normally able to recall (first 3 days only): Current season, That he or she is in a hospital, Location of own room   Medical Problem List and Plan:  1. Right hemiparesis and functional deficits secondary to left periventricular white matter and right frontal lobe infarcts    Cont CIR -   PT, OT, SLP  2. DVT Prophylaxis/Anticoagulation: Pharmaceutical: Lovenox  q 24h 3. Chronic HA/Pain Management: Depakote bid with tylenol prn.  Lower extremity paresthesias  Trial of Cymbalta 4. Mood: LCSW to follow for evaluation and support.    Emotional lability trial of Cymbalta.    If this is not helpful may consider SSRI 5. Neuropsych: This patient is not fully capable of making decisions on her own behalf. May need Neuropsych input to address the degree to which pt can participate in medical decision making 6. Skin/Wound Care: routine pressure relief measures.  7. Fluids/Electrolytes/Nutrition: Monitor I/Os  8. 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 Monitor BS ac/hs. Educate on importance of compliance.  CBG (last 3)  Recent Labs    07/25/17 1641 07/25/17 2103 07/26/17 0620  GLUCAP 119* 189* 122*    controlled on 5/12 9. Dyslipidemia: On Crestor.  10. Anemia: Hgb stable at 9.5 on 5/1, improved to 10.4 on 5/7 11. HTN with orthostasis- will check q shift, no syncope x 3 d   Monitor BP bid.   -no orthostasis today  -bp's have been trending too high  -lisinopril is  bid  -added low dose metoprolol 12.5mg  5/11 with some improvement Vitals:   07/25/17 1944 07/26/17 0423  BP: (!) 167/68 (!) 182/74  Pulse: 74 72  Resp: 16 16  Temp: 98.6 F (37 C) 98.1 F (36.7 C)  SpO2: 100% 95%   Ordered TEDs  Abdominal binder    Continue to monitor- IVF per Cardiology 12. Hypoalbuminemia  Supplement initiated on 5/4 13.  AMS probable seizure,CT neg forhemmorhagic transformation, EEG neg for seizure , slowing noted,  On Depakene, reviewed Neuro outpt note, possible seizure in 2017 , has also been treated for migraines per outpt Neuro Dr Everlena Cooper, added Keppra to Valproate depakene 14.  Mild abd tenderness improved KUB neg 15.  Dysphagia moderately severe- supplement fluids with IV at noc  -Dr  Riley Kill had long discussion with pt and family 5/11 re: PEG, I discussed with pt today stating there is still some improvement expected with swallow as long as there is not another stroke that affects swallow function    -they have decided that they would like to proceed with placement, but today 5/13 , pt states she does not want feeding tube.  We discussed probability of manutrition, dehydration and difficulty with med administration (pt has been refusing some meds per RN) Needs Palliative consult to discuss pt's GOC with daughter present.   -have held plavix (last dose received 5/11) in expectation of placement this week 5/16 or 5/17        LOS (Days) 13 A FACE TO FACE EVALUATION WAS PERFORMED  Erick Colace 07/26/2017, 8:43 AM

## 2017-07-26 NOTE — Progress Notes (Signed)
PMT consult received. Chart reviewed and visited with patient at bedside. She is awake, alert, and sitting in wheelchair. Denies pain or discomfort. Briefly spoke about feeding tube placement for which she tells me "I have to do it" and doing it for her daughter.   Voicemail left for daughter, Evelina Bucy, on 5/12 and also today, 5/13. Awaiting return call. Will continue to follow and attempt GOC discussion with patient and daughter when available. Per chart review, plan is for PEG tube placement on 5/16 or 5/17.   NO CHARGE  Vennie Homans, FNP-C Palliative Medicine Team  Phone: [phone] Fax: [phone]

## 2017-07-26 NOTE — Consult Note (Signed)
Chief Complaint: Patient was seen in consultation today for percutaneous gastric tube placement at the request of Dr Z Swartz  Supervising Physician: Kelly Romero Status: [facility] - In-pt  History of Present Illness: Kelly Romero is a 52 y.o. female   CVA Aspiration risk Dysphagia Malnutrition Dr Kelly Romero note 5/12: Dysphagia moderately severe- supplement fluids with IV at noc             -had long discussion with pt and family 5/11 re: PEG             -discussed pros and cons of placement             -they have decided that they would like to proceed with placement             -have held plavix (last dose received 5/11) in expectation of placement this week 5/16 or 5/17   Request for percutaneous gastric tube placement Imaging has been reviewed and approved for procedure LAST DOSE Plavix 5/11 Scheduled procedure for 5/16 or 5/17   Past Medical History:  Diagnosis Date  . Diabetes mellitus without complication (HCC)   . Headache   . Hyperlipidemia   . Hypertension   . Stroke (HCC)   . TIA (transient ischemic attack)     Past Surgical History:  Procedure Laterality Date  . APPENDECTOMY    . CHOLECYSTECTOMY    . FOOT GANGLION EXCISION    . HERNIA REPAIR    . IR GENERIC HISTORICAL  12/30/2015   IR ANGIO INTRA EXTRACRAN SEL COM CAROTID INNOMINATE BILAT MOD SED 12/30/2015 Kelly Cotton, MD [facility]-INTER[facility] RAD  . IR GENERIC HISTORICAL  12/30/2015   IR ANGIO VERTEBRAL SEL VERTEBRAL BILAT MOD SED 12/30/2015 Kelly Cotton, MD [facility]  . LOOP RECORDER INSERTION N/A 07/13/2017   Procedure: LOOP RECORDER INSERTION;  Surgeon: Hillis Range, MD;  [facility]: [facility] INV[facility];  Service: Cardiovascular;  Laterality: N/A;  . TEE WITHOUT CARDIOVERSION N/A 04/19/2015   Procedure: TRANSESOPHAGEAL ECHOCARDIOGRAM (TEE);  Surgeon: Jake Bathe, MD;  Location: [facility] ENDOSCOPY;  Service: Cardiovascular;  Laterality: N/A;  . VAGINAL HYSTERECTOMY       Allergies: Erythromycin and Latex  Medications: Prior to Admission medications   Medication Sig Start Date End Date Taking? Authorizing Provider  aspirin EC 81 MG tablet Take 1 tablet by mouth every morning. 02/24/15  Yes [provider]  clopidogrel (PLAVIX) 75 MG tablet Take 1 tablet (75 mg total) by mouth daily. 01/15/16  Yes Jaffe, Adam R, DO  Coenzyme Q10 (CO Q-10) 200 MG CAPS Take 200 mg by mouth every evening.   Yes [provider]  divalproex (DEPAKOTE) 500 MG DR tablet Take 1 tablet (500 mg total) by mouth 2 (two) times daily. 01/01/16  Yes Reymundo Poll, MD  insulin detemir (LEVEMIR) 100 UNIT/ML injection Inject 40 Units into the skin daily before breakfast.    Yes [provider]  lisinopril (PRINIVIL,ZESTRIL) 40 MG tablet Take 1 tablet (40 mg total) by mouth daily. 01/06/17  Yes Runell Gess, MD  Magnesium 500 MG TABS Take 500 mg by mouth every evening.    Yes [provider]  Riboflavin (B2) 100 MG TABS Take 200 mg by mouth every morning.   Yes [provider]  rosuvastatin (CRESTOR) 40 MG tablet Take 1 tablet (40 mg total) by mouth at bedtime. 01/01/16  Yes Reymundo Poll, MD     Family History  Problem Relation Age of Onset  .  Stroke Father        32s  . Heart attack Father 66  . COPD Mother   . Heart failure Brother   . Cancer Maternal Grandmother        unknown   . COPD Unknown   . Heart failure Maternal Grandfather   . Hypertension Maternal Grandfather   . Cancer Paternal Grandfather        lung  . Diabetes Paternal Grandmother     Social History   Socioeconomic History  . Marital status: Married    Spouse name: Not on file  . Number of children: Not on file  . Years of education: Not on file  . Highest education level: Not on file  Occupational History  . Occupation: Data processing manager  Social Needs  . Financial resource strain: Not on file  . Food insecurity:    Worry: Not on file     Inability: Not on file  . Transportation needs:    Medical: Not on file    Non-medical: Not on file  Tobacco Use  . Smoking status: Never Smoker  . Smokeless tobacco: Never Used  Substance and Sexual Activity  . Alcohol use: No    Alcohol/week: 0.0 oz    Comment: rare   . Drug use: No  . Sexual activity: Not Currently  Lifestyle  . Physical activity:    Days per week: Not on file    Minutes per session: Not on file  . Stress: Not on file  Relationships  . Social connections:    Talks on phone: Not on file    Gets together: Not on file    Attends religious service: Not on file    Active member of club or organization: Not on file    Attends meetings of clubs or organizations: Not on file    Relationship status: Not on file  Other Topics Concern  . Not on file  Social History Narrative  . Not on file     Review of Systems: A 12 point ROS discussed and pertinent positives are indicated in the HPI above.  All other systems are negative.  Review of Systems  Constitutional: Positive for activity change and fatigue. Negative for unexpected weight change.  HENT: Positive for trouble swallowing.   Respiratory: Negative for cough and shortness of breath.   Cardiovascular: Negative for chest pain.  Gastrointestinal: Negative for abdominal pain.  Neurological: Positive for speech difficulty and weakness.  Psychiatric/Behavioral: Positive for confusion.    Vital Signs: BP (!) 182/74 (BP Location: Left Arm)   Pulse 72   Temp 98.1 F (36.7 C) (Oral)   Resp 16   Ht 5\' 5"  (1.651 m)   Wt 210 lb 5.1 oz (95.4 kg)   SpO2 95%   BMI 35.00 kg/m   Physical Exam  Cardiovascular: Normal rate and regular rhythm.  Pulmonary/Chest: Effort normal and breath sounds normal.  Abdominal: Soft. Bowel sounds are normal.  Musculoskeletal: Normal range of motion.  Neurological: She is alert.  Skin: Skin is warm and dry.  Psychiatric: She has a normal mood and affect. Her behavior is normal.  Judgment and thought content normal.  Pt is answering my questions today Saying she is aware of G tube and agreeable I told her I was calling Daughter for consent and she agreed.  Nursing note and vitals reviewed.   Imaging: Ct Abdomen Wo Contrast  Result Date: 07/24/2017 CLINICAL DATA:  Evaluate anatomy prior to potential percutaneous gastrostomy tube placement. EXAM:  CT ABDOMEN WITHOUT CONTRAST TECHNIQUE: Multidetector CT imaging of the abdomen was performed following the standard protocol without IV contrast. COMPARISON:  CT abdomen and pelvis-05/03/2014 FINDINGS: Lower chest: Limited visualization of the lower thorax demonstrates minimal dependent subpleural ground-glass atelectasis. No discrete focal airspace opacities. Heart size. No pericardial effusion. Calcifications of the mitral valve annulus. Hepatobiliary: Normal hepatic contour. Post cholecystectomy. No ascites. Pancreas: Partial fatty replacement of the pancreas. Spleen: Normal noncontrast appearance the spleen Adrenals/Urinary Tract: Punctate (approximately 4 mm) nonobstructing left-sided renal stone. No evidence of right-sided nephrolithiasis. No urine obstruction or perinephric stranding. Normal noncontrast appearance the bilateral adrenal glands. Urinary bladder was not imaged. Stomach/Bowel: Normal orientation of the stomach without interposed liver or colon between the anterior wall the stomach and ventral wall the abdomen. Enteric contrast is seen throughout the colon. No evidence of enteric obstruction. Vascular/Lymphatic: Minimal amount of atherosclerotic plaque with a normal caliber abdominal aorta. No bulky porta hepatis, retroperitoneal mesenteric adenopathy on this noncontrast examination. Other: Post ventral abdominal wall hernia repair without evidence of recurrence within the imaged segment. Note, the cranial aspect of the surgical mesh appears caudal to the expected location of the insertion site of the gastrostomy tube.  Musculoskeletal: No acute or aggressive osseous abnormalities. Stigmata of DISH within the lower thoracic spine. IMPRESSION: 1. Gastric anatomy amenable to potential percutaneous gastrostomy tube placement as indicated. 2. Post mesh repair of the ventral aspect of the abdomen, however the cranial aspect of the surgical mesh appears caudal to the expected insertion site of the gastrostomy tube. 3. Punctate (approximately 4 mm) nonobstructing left-sided renal stone, similar to the 2/16 examination. 4.  Aortic Atherosclerosis (ICD10-I70.0). Electronically Signed   By: Simonne Come M.D.   On: 07/24/2017 08:53   Dg Abd 1 View  Result Date: 07/20/2017 CLINICAL DATA:  Left lower quadrant pain EXAM: ABDOMEN - 1 VIEW COMPARISON:  05/03/2014 FINDINGS: Postsurgical changes are noted in the anterior abdominal wall consistent with prior hernia repair. Changes of a previous cholecystectomy are seen as well. Scattered large and small bowel gas is noted. No bony abnormality is seen. IMPRESSION: No acute abnormality noted. Electronically Signed   By: Alcide Clever M.D.   On: 07/20/2017 10:50   Ct Head Wo Contrast  Result Date: 07/19/2017 CLINICAL DATA:  Possible seizure today. EXAM: CT HEAD WITHOUT CONTRAST TECHNIQUE: Contiguous axial images were obtained from the base of the skull through the vertex without intravenous contrast. COMPARISON:  Brain MRI 07/11/2017.  Head CT scan 07/08/2017. FINDINGS: Brain: No evidence of acute infarction, hemorrhage, hydrocephalus, extra-axial collection or mass lesion/mass effect. Atrophy, chronic microvascular ischemic change and remote left PCA territory infarct are seen as on the prior exams. Vascular: Atherosclerosis noted. Skull: Intact. Sinuses/Orbits: Status post bilateral lens extraction. Paranasal sinuses and mastoid air cells are clear. Other: None. IMPRESSION: No acute abnormality. Chronic microvascular ischemic change and remote left PCA infarct. Atherosclerosis. Electronically  Signed   By: Drusilla Kanner M.D.   On: 07/19/2017 12:28   Ct Head Wo Contrast  Result Date: 07/08/2017 CLINICAL DATA:  Weakness and slurred speech for 1 day EXAM: CT HEAD WITHOUT CONTRAST TECHNIQUE: Contiguous axial images were obtained from the base of the skull through the vertex without intravenous contrast. COMPARISON:  04/27/2017 FINDINGS: Brain: Old left PCA infarct is identified. Scattered lacunar infarcts are again identified and stable. No findings to suggest acute hemorrhage, acute infarction or space-occupying lesion are noted. Vascular: No hyperdense vessel or unexpected calcification. Skull: Normal. Negative for fracture or focal lesion.  Sinuses/Orbits: No acute finding. Other: None. IMPRESSION: Chronic ischemic changes are noted.  No acute abnormality noted. Electronically Signed   By: Alcide Clever M.D.   On: 07/08/2017 12:37   Mr Brain Wo Contrast (neuro Protocol)  Result Date: 07/11/2017 CLINICAL DATA:  Initial evaluation for worsening right-sided weakness with slurred speech, recent stroke. EXAM: MRI HEAD WITHOUT CONTRAST TECHNIQUE: Multiplanar, multiecho pulse sequences of the brain and surrounding structures were obtained without intravenous contrast. COMPARISON:  Prior MRI from 07/09/2017. FINDINGS: Brain: Age-related cerebral atrophy with chronic small vessel ischemic disease again noted. Encephalomalacia with gliosis within the parasagittal left parietal lobe consistent with remote ischemic infarct. Small remote lacunar infarcts present within the bilateral basal ganglia/corona radiata as well as the left thalamus. Chronic hemorrhagic blood products present about several of these infarcts. Continued interval evolution of recently identified small ischemic infarcts involving the left periventricular white matter, relatively stable in size and distribution as compared to previous. No evidence for hemorrhagic transformation or significant mass effect. There are several small areas of new  acute infarction involving the cortical gray matter and underlying periventricular white matter of the parasagittal right frontal lobe (series 3, image 34, 32). Largest area of infarct measures 12 mm. No associated hemorrhage or mass effect. No other areas of new or interval infarction. Gray-white matter differentiation otherwise maintained. No mass lesion, midline shift or mass effect. No hydrocephalus. No extra-axial fluid collection. Major dural sinuses are grossly patent. Pituitary gland suprasellar region normal. Midline structures intact. Vascular: Major intracranial vascular flow voids are maintained at the skull base. Skull and upper cervical spine: Craniocervical junction normal. Upper cervical spine normal. Bone marrow signal intensity within normal limits. Hyperostosis frontalis interna noted. No scalp soft tissue abnormality. Sinuses/Orbits: Globes and orbital soft tissues within normal limits. Patient status post lens extraction bilaterally. Mild scattered mucosal thickening within the ethmoidal air cells and maxillary sinuses. No air-fluid level to suggest acute sinusitis. No mastoid effusion. Inner ear structures normal. Other: None. IMPRESSION: 1. Few small volume new acute ischemic infarcts involving the parasagittal cortical and subcortical/periventricular left frontal lobe, new relative to recent MRI from 07/09/2017. No associated hemorrhage or mass effect. 2. Normal expected interval evolution of recently identified small volume left-sided infarcts, relatively stable in size and distribution as compared to previous. 3. Otherwise stable appearance of the brain with chronic atrophy and small vessel ischemic disease, with multiple scattered remote infarcts as above. Electronically Signed   By: Rise Mu M.D.   On: 07/11/2017 22:44   Mr Brain Wo Contrast  Result Date: 07/09/2017 CLINICAL DATA:  Stroke aphasia.  Right-sided weakness EXAM: MRI HEAD WITHOUT CONTRAST MRA HEAD WITHOUT  CONTRAST TECHNIQUE: Multiplanar, multiecho pulse sequences of the brain and surrounding structures were obtained without intravenous contrast. Angiographic images of the head were obtained using MRA technique without contrast. COMPARISON:  CT head one thousand nineteen FINDINGS: MRI HEAD FINDINGS Brain: Small areas of acute infarction in the left corona radiata and left frontal white matter. Chronic infarct left occipital parietal lobe. Chronic microvascular ischemic change in the white matter bilaterally. Negative for hemorrhage mass or edema. Vascular: Normal arterial flow voids Skull and upper cervical spine: Negative Sinuses/Orbits: Mucosal edema right maxillary sinus. Bilateral cataract removal Other: None MRA HEAD FINDINGS Image quality degraded by motion Moderately severe stenosis distal vertebral artery bilaterally. Mild to moderate stenosis proximal basilar. Cerebellar arteries not well seen due to motion. Severe stenosis proximal right posterior cerebral artery. Occlusion mid left posterior cerebral artery. Fetal origin left  posterior cerebral artery. Atherosclerotic irregularity and mild moderate stenosis in the cavernous carotid bilaterally. Diffuse atherosclerotic disease and middle cerebral arteries bilaterally. Both anterior cerebral arteries are patent. IMPRESSION: Small areas of acute infarct in the deep white matter on the left Moderate chronic ischemic changes in the white matter and left occipital parietal lobe. MRA degraded by motion. Diffuse intracranial atherosclerotic disease. Electronically Signed   By: Marlan Palau M.D.   On: 07/09/2017 12:31   US Carotid Bilateral (at [facility] And [facility] Only)  Result Date: 07/09/2017 CLINICAL DATA:  52 year old female with a history of right-sided cerebrovascular accident EXAM: BILATERAL CAROTID DUPLEX ULTRASOUND TECHNIQUE: Wallace Cullens scale imaging, color Doppler and duplex ultrasound were performed of bilateral carotid and vertebral arteries in the neck.  COMPARISON:  Prior duplex carotid ultrasound 10/02/2016 FINDINGS: Criteria: Quantification of carotid stenosis is based on velocity parameters that correlate the residual internal carotid diameter with NASCET-based stenosis levels, using the diameter of the distal internal carotid lumen as the denominator for stenosis measurement. The following velocity measurements were obtained: RIGHT ICA: 115/30 cm/sec CCA: 86/15 cm/sec SYSTOLIC ICA/CCA RATIO:  1.0 ECA:  93 cm/sec LEFT ICA: 98/30 cm/sec CCA: 86/16 cm/sec SYSTOLIC ICA/CCA RATIO:  1.1 ECA:  122 cm/sec RIGHT CAROTID ARTERY: Moderate heterogeneous and slightly irregular atherosclerotic plaque in the proximal internal carotid artery. By peak systolic velocity criteria, the estimated stenosis remains less than 50%. RIGHT VERTEBRAL ARTERY:  Patent with normal antegrade flow. LEFT CAROTID ARTERY: Trace heterogeneous and relatively smooth atherosclerotic plaque in the proximal internal carotid artery. By peak systolic velocity criteria, the estimated stenosis remains less than 50%. LEFT VERTEBRAL ARTERY:  Patent with normal antegrade flow. IMPRESSION: 1. Mild (1-49%) stenosis proximal right internal carotid artery secondary to moderate heterogeneous and slightly irregular atherosclerotic plaque. 2. Mild (1-49%) stenosis proximal left internal carotid artery secondary to minimal heterogeneous but smooth atherosclerotic plaque. 3. Vertebral arteries are patent with normal antegrade flow. Signed, Sterling Big, MD [facility] Electronically Signed   By: Malachy Moan M.D.   On: 07/09/2017 09:05   Dg Swallowing Func-speech Pathology  Result Date: 07/22/2017 Objective Swallowing Evaluation: Type of Study: MBS-Modified Barium Swallow Study  Patient Details Name: KAILEIGH VISWANATHAN MRN: [id number] Date of Birth: June 14, 1965 Today's Date: 07/22/2017 Time: SLP Start Time (ACUTE ONLY): 4098 -SLP Stop Time (ACUTE  ONLY): 0947 SLP Time Calculation (min) (ACUTE ONLY): 31 min Past Medical History: Past Medical History: Diagnosis Date . Diabetes mellitus without complication (HCC)  . Headache  . Hyperlipidemia  . Hypertension  . Stroke (HCC)  . TIA (transient ischemic attack)  Past Surgical History: Past Surgical History: Procedure Laterality Date . APPENDECTOMY   . CHOLECYSTECTOMY   . FOOT GANGLION EXCISION   . HERNIA REPAIR   . IR GENERIC HISTORICAL  12/30/2015  IR ANGIO INTRA EXTRACRAN SEL COM CAROTID INNOMINATE BILAT MOD SED 12/30/2015 Kelly Cotton, MD [facility]INTER[facility] . IR GENERIC HISTORICAL  12/30/2015  IR ANGIO VERTEBRAL SEL VERTEBRAL BILAT MOD SED 12/30/2015 Kelly Cotton, MD [facility] . LOOP RECORDER INSERTION N/A 07/13/2017  Procedure: LOOP RECORDER INSERTION;  Surgeon: Hillis Range, MD;  [facility]: [facility] INVASIVE CV LAB;  Service: Cardiovascular;  Laterality: N/A; . TEE WITHOUT CARDIOVERSION N/A 04/19/2015  Procedure: TRANSESOPHAGEAL ECHOCARDIOGRAM (TEE);  Surgeon: Jake Bathe, MD;  Location: [facility] ENDOSCOPY;  Service: Cardiovascular;  Laterality: N/A; . VAGINAL HYSTERECTOMY   HPI: Pt. is a 52 y.o. female who was admitted to [facility] with Aphasia, RUE weakness, slurred speech, and  difficulty with word finding. Pt. has a PMHx of CVA, TIA, DM, Headache, Hyperlipidemia, HTN, TIA, Appendectomy, Cholecystectomy, Hernia Repair. She went home with family and had worsening of speech and returned to [facility] ED. MRI showed new L frontal lobe infarcts compared to MRI on 07/09/17.  Subjective: pt pleasant and cooperative Assessment / Plan / Recommendation CHL IP CLINICAL IMPRESSIONS 07/22/2017 Clinical Impression Of note, this is pt's first instrumental study d/t intermittent coughing at bedside with thin liquids. Pt presents with severe oral phase dysphagia that is further completed by moderate cognitive deficits. Pt's oral phase is c/b no lingual movement, tongue pumping and multiple attempts to transfer bolus posteriorally. Despite  Total A multimodal cues as well as right head turn and supraglottic swallow, pt is not able to decrease oral phase or increase timeliness of swallow initiation. Chin tuck not attempted d/t no ability to transfer bolus posteriorally when head in neutral alignment.  Pt is able to orally contain nectar thick liquids and honey thick liquids, however with nectar thick liquids pt's delayed swallow results in silent deep penetration to cords with residue eventually being silently aspirated. Pt is not able to orally contain thin liquids which results in premature spillage of bolus and moderate silent aspiration. Pt with delayed swallow initiation to valleculae with puree and honey thick liquids but no penetration or aspriation observed with these textures. At this time pt's is at a very high risk of aspiration. Given history of multiple CVAs, decreased recent progress, moderate silent aspiraiton, moderate cognitive deficits, significantly decreased vocal intensity and significantly weak cough, I am recommend puree diet with honey thick liquids, medicince crushed in puree and recommend long term alternative means of nutrition such as PEG.  SLP Visit Diagnosis Dysphagia, oral phase (R13.11) Attention and concentration deficit following -- Frontal lobe and executive function deficit following -- Impact on safety and function Risk for inadequate nutrition/hydration;Severe aspiration risk   CHL IP TREATMENT RECOMMENDATION 07/22/2017 Treatment Recommendations Therapy as outlined in treatment plan below   No flowsheet data found. CHL IP DIET RECOMMENDATION 07/22/2017 SLP Diet Recommendations Alternative means - long-term;Dysphagia 1 (Puree) solids;Honey thick liquids Liquid Administration via Cup;Spoon Medication Administration Whole meds with puree Compensations Minimize environmental distractions;Lingual sweep for clearance of pocketing;Slow rate;Small sips/bites Postural Changes Seated upright at 90 degrees   CHL IP OTHER  RECOMMENDATIONS 07/22/2017 Recommended Consults -- Oral Care Recommendations Oral care BID Other Recommendations Order thickener from pharmacy;Prohibited food (jello, ice cream, thin soups);Have oral suction available;Remove water pitcher   No flowsheet data found.  CHL IP FREQUENCY AND DURATION 07/10/2017 Speech Therapy Frequency (ACUTE ONLY) min 3x week Treatment Duration --      CHL IP ORAL PHASE 07/22/2017 Oral Phase Impaired Oral - Pudding Teaspoon -- Oral - Pudding Cup -- Oral - Honey Teaspoon Weak lingual manipulation;Right anterior bolus loss;Lingual pumping;Reduced posterior propulsion;Holding of bolus;Delayed oral transit Oral - Honey Cup Right anterior bolus loss;Weak lingual manipulation;Lingual pumping;Reduced posterior propulsion;Holding of bolus;Delayed oral transit Oral - Nectar Teaspoon Right anterior bolus loss;Weak lingual manipulation;Lingual pumping;Reduced posterior propulsion;Delayed oral transit;Holding of bolus Oral - Nectar Cup Right anterior bolus loss;Weak lingual manipulation;Lingual pumping;Reduced posterior propulsion;Holding of bolus;Delayed oral transit Oral - Nectar Straw -- Oral - Thin Teaspoon Right anterior bolus loss;Weak lingual manipulation;Lingual pumping;Reduced posterior propulsion;Holding of bolus;Premature spillage;Decreased bolus cohesion;Delayed oral transit Oral - Thin Cup -- Oral - Thin Straw -- Oral - Puree Right anterior bolus loss;Weak lingual manipulation;Lingual pumping;Reduced posterior propulsion;Holding of bolus;Delayed oral transit;Decreased bolus cohesion;Premature spillage Oral - Mech Soft --  Oral - Regular -- Oral - Multi-Consistency -- Oral - Pill -- Oral Phase - Comment --  CHL IP PHARYNGEAL PHASE 07/22/2017 Pharyngeal Phase Impaired Pharyngeal- Pudding Teaspoon -- Pharyngeal -- Pharyngeal- Pudding Cup -- Pharyngeal -- Pharyngeal- Honey Teaspoon Delayed swallow initiation-vallecula Pharyngeal -- Pharyngeal- Honey Cup Delayed swallow initiation-vallecula;Reduced  laryngeal elevation Pharyngeal -- Pharyngeal- Nectar Teaspoon Delayed swallow initiation-pyriform sinuses;Penetration/Aspiration before swallow;Penetration/Aspiration during swallow;Reduced laryngeal elevation;Reduced anterior laryngeal mobility Pharyngeal Material enters airway, passes BELOW cords without attempt by patient to eject out (silent aspiration) Pharyngeal- Nectar Cup Delayed swallow initiation-pyriform sinuses;Reduced anterior laryngeal mobility;Reduced laryngeal elevation;Penetration/Aspiration before swallow;Penetration/Aspiration during swallow Pharyngeal Material enters airway, passes BELOW cords without attempt by patient to eject out (silent aspiration) Pharyngeal- Nectar Straw -- Pharyngeal -- Pharyngeal- Thin Teaspoon Delayed swallow initiation-pyriform sinuses;Penetration/Aspiration before swallow;Penetration/Aspiration during swallow;Moderate aspiration;Reduced laryngeal elevation;Reduced anterior laryngeal mobility Pharyngeal Material enters airway, passes BELOW cords without attempt by patient to eject out (silent aspiration) Pharyngeal- Thin Cup NT Pharyngeal -- Pharyngeal- Thin Straw -- Pharyngeal -- Pharyngeal- Puree Delayed swallow initiation-vallecula Pharyngeal -- Pharyngeal- Mechanical Soft -- Pharyngeal -- Pharyngeal- Regular -- Pharyngeal -- Pharyngeal- Multi-consistency -- Pharyngeal -- Pharyngeal- Pill -- Pharyngeal -- Pharyngeal Comment --  CHL IP CERVICAL ESOPHAGEAL PHASE 07/22/2017 Cervical Esophageal Phase WFL Pudding Teaspoon -- Pudding Cup -- Honey Teaspoon -- Honey Cup -- Nectar Teaspoon -- Nectar Cup -- Nectar Straw -- Thin Teaspoon -- Thin Cup -- Thin Straw -- Puree -- Mechanical Soft -- Regular -- Multi-consistency -- Pill -- Cervical Esophageal Comment -- No flowsheet data found. Happi Overton 07/22/2017, 12:04 PM              Mr Maxine Glenn Head/brain Wo Cm  Result Date: 07/09/2017 CLINICAL DATA:  Stroke aphasia.  Right-sided weakness EXAM: MRI HEAD WITHOUT CONTRAST MRA HEAD  WITHOUT CONTRAST TECHNIQUE: Multiplanar, multiecho pulse sequences of the brain and surrounding structures were obtained without intravenous contrast. Angiographic images of the head were obtained using MRA technique without contrast. COMPARISON:  CT head one thousand nineteen FINDINGS: MRI HEAD FINDINGS Brain: Small areas of acute infarction in the left corona radiata and left frontal white matter. Chronic infarct left occipital parietal lobe. Chronic microvascular ischemic change in the white matter bilaterally. Negative for hemorrhage mass or edema. Vascular: Normal arterial flow voids Skull and upper cervical spine: Negative Sinuses/Orbits: Mucosal edema right maxillary sinus. Bilateral cataract removal Other: None MRA HEAD FINDINGS Image quality degraded by motion Moderately severe stenosis distal vertebral artery bilaterally. Mild to moderate stenosis proximal basilar. Cerebellar arteries not well seen due to motion. Severe stenosis proximal right posterior cerebral artery. Occlusion mid left posterior cerebral artery. Fetal origin left posterior cerebral artery. Atherosclerotic irregularity and mild moderate stenosis in the cavernous carotid bilaterally. Diffuse atherosclerotic disease and middle cerebral arteries bilaterally. Both anterior cerebral arteries are patent. IMPRESSION: Small areas of acute infarct in the deep white matter on the left Moderate chronic ischemic changes in the white matter and left occipital parietal lobe. MRA degraded by motion. Diffuse intracranial atherosclerotic disease. Electronically Signed   By: Marlan Palau M.D.   On: 07/09/2017 12:31    Labs:  CBC: Recent Labs    07/13/17 0426 07/14/17 0549 07/19/17 0944 07/20/17 0625  WBC 4.0 4.8 4.7 4.3  HGB 9.1* 9.5* 10.2* 10.4*  HCT 28.9* 29.8* 32.8* 33.1*  PLT 233 252 252 240    COAGS: Recent Labs    07/08/17 1224 07/11/17 1717  INR 0.90 0.98  APTT 39* 38*    BMP: Recent Labs  07/13/17 0426  07/14/17 0549 07/19/17 0944 07/20/17 0625  NA 143 142 140 141  K 3.6 3.6 3.7 3.9  CL 107 105 102 103  CO2 GLUCOSE 128* 120* 155* 108*  BUN CALCIUM 9.4 9.5 9.3 9.2  CREATININE 0.97 0.95 1.14* 1.14*  GFRNONAA >60 >60 54* 54*  GFRAA >60 >60 >60 >60    LIVER FUNCTION TESTS: Recent Labs    07/08/17 1224 07/11/17 1717 07/14/17 0549 07/19/17 0944  BILITOT 0.5 0.6 0.6 0.4  AST 20 16 14* 18  ALT 12* 11* 10* 12*  ALKPHOS 77 66 59 60  PROT 6.9 6.7 5.6* 6.1*  ALBUMIN 3.4* 2.8* 2.5* 2.8*    TUMOR MARKERS: No results for input(s): AFPTM, CEA, CA199, CHROMGRNA in the last 8760 hours.  Assessment and Plan:  CVA Dysphagia; aspiration risk Malnutrition Scheduled for percutaneous gastric tube placement in IR LD Plavix 5/11-- plan for procedure 5/16 or 17 Risks and benefits discussed with the patient and daughter Kelly Romero via phone including, but not limited to the need for a barium enema during the procedure, bleeding, infection, peritonitis, or damage to adjacent structures.  All of Terris questions were answered, she is agreeable to proceed. Consent signed and in chart.   Thank you for this interesting consult.  I greatly enjoyed meeting BIRGIT NOWLING and look forward to participating in their care.  A copy of this report was sent to the requesting provider on this date.  Electronically Signed: Robet Leu, PA-C 07/26/2017, 12:52 PM   I spent a total of 40 Minutes    in face to face in clinical consultation, greater than 50% of which was counseling/coordinating care for perc gastric tube placement

## 2017-07-26 NOTE — Progress Notes (Addendum)
Physical Therapy Note  Patient Details  Name: AZRIELLE SPRINGSTEEN MRN: [id number] Date of Birth: June 22, 1965 Today's Date: 07/26/2017  0900-1000, 60 min individual tx Pain: none per pt  PT adjusted pt's tigh high TEDS and donned her shoes and abdominal binder while she was sitting in w/c. PT provided pt education about need for these items due to orthostatic hypotension. TEDs are not snug; pt consulted with Amy, OT about getting a smaller pair.  W/c propulsion using hemi method x 50' with min assist for steering, pillow behind back to facilitate foot to floor.  PT adjusted pt's footrests of w/c for better fit and function.  Multimodal cues for midline orientation in sitting, due to pt drifting to R.   Therapeutic activity in sitting for midline orientation, reaching L>< midline with L hand to match playing cards on board in front of her.  Pt needed extra time, but was accurate with 8/9 wihtout cueing.  Gait training with RW x 10' with min assist.  When questioned, pt stated she was dizzy, and RLE clearance decreased over distance.  Pt sat and rested.  Sit> stand again with min assist to adjust abdominal binder; fit is difficult due to pt's body habitus.  Pt left resting in w/c with quick release belt applied, seat alarm set and all needs within reach.  See function navigator for current status.   Verneal Wiers 07/26/2017, 7:56 AM

## 2017-07-27 ENCOUNTER — Inpatient Hospital Stay (HOSPITAL_COMMUNITY): Payer: Medicaid Other | Admitting: Physical Therapy

## 2017-07-27 ENCOUNTER — Ambulatory Visit (HOSPITAL_COMMUNITY): Payer: Medicaid Other

## 2017-07-27 ENCOUNTER — Inpatient Hospital Stay (HOSPITAL_COMMUNITY): Payer: Medicaid Other | Admitting: Occupational Therapy

## 2017-07-27 LAB — BASIC METABOLIC PANEL
ANION GAP: 9 (ref 5–15)
BUN: 9 mg/dL (ref 6–20)
CO2: 29 mmol/L (ref 22–32)
Calcium: 9.4 mg/dL (ref 8.9–10.3)
Chloride: 105 mmol/L (ref 101–111)
Creatinine, Ser: 1.03 mg/dL — ABNORMAL HIGH (ref 0.44–1.00)
GFR calc non Af Amer: >60 mL/min (ref >60)
GLUCOSE: 124 mg/dL — AB (ref 65–99)
Potassium: 3.8 mmol/L (ref 3.5–5.1)
Sodium: 143 mmol/L (ref 135–145)

## 2017-07-27 LAB — GLUCOSE, CAPILLARY
GLUCOSE-CAPILLARY: 177 mg/dL — AB (ref 65–99)
GLUCOSE-CAPILLARY: 131 mg/dL — AB (ref 65–99)
Glucose-Capillary: 112 mg/dL — ABNORMAL HIGH (ref 65–99)
Glucose-Capillary: 159 mg/dL — ABNORMAL HIGH (ref 65–99)

## 2017-07-27 LAB — CBC
HEMATOCRIT: 33.9 % — AB (ref 36.0–46.0)
HEMOGLOBIN: 10.6 g/dL — AB (ref 12.0–15.0)
MCH: 28 pg (ref 26.0–34.0)
MCHC: 31.3 g/dL (ref 30.0–36.0)
MCV: 89.7 fL (ref 78.0–100.0)
Platelets: 229 10*3/uL (ref 150–400)
RBC: 3.78 MIL/uL — AB (ref 3.87–5.11)
RDW: 12.9 % (ref 11.5–15.5)
WBC: 4.7 10*3/uL (ref 4.0–10.5)

## 2017-07-27 LAB — PROTIME-INR
INR: 1.02
Prothrombin Time: 13.3 seconds (ref 11.4–15.2)

## 2017-07-27 NOTE — Progress Notes (Signed)
Speech Language Pathology Weekly Progress and Session Note  Patient Details  Name: Kelly Romero MRN: [id number] Date of Birth: 1965-09-20  Beginning of progress report period: Jul 21, 2017 End of progress report period: Jul 27, 2017  Today's Date: 07/27/2017 SLP Individual Time: 0800-0900 SLP Individual Time Calculation (min): 60 min  Short Term Goals: Week 2: SLP Short Term Goal 1 (Week 2): Pt will utilize word finding strategies to convey basic thoughts and ideas with Min A cues.  SLP Short Term Goal 1 - Progress (Week 2): Met SLP Short Term Goal 2 (Week 2): Pt will utilize speech intelligibility strategies with Min A  to increase speech intelligibility to ~ 90% at the word level.  SLP Short Term Goal 2 - Progress (Week 2): Met SLP Short Term Goal 3 (Week 2): Pt will solve semi-complex problem solving tasks with Mod A cues.  SLP Short Term Goal 3 - Progress (Week 2): Not met SLP Short Term Goal 4 (Week 2): Pt will demonstrate selective attention in moderately distracting environment for 30 minutes iwth Mod A cues.  SLP Short Term Goal 4 - Progress (Week 2): Met SLP Short Term Goal 5 (Week 2): Pt will consume puree with honey thick liquids and minimal s/s of aspiration and Mod A for useof compensatory swallow strategies.  SLP Short Term Goal 5 - Progress (Week 2): Met    New Short Term Goals: Week 3: SLP Short Term Goal 1 (Week 3): Pt will utilize word finding strategies to convey basic thoughts and ideas with supervision A cues.  SLP Short Term Goal 2 (Week 3): Pt will utilize speech intelligibility strategies with Mod A  to increase speech intelligibility to ~ 90% at the phrase level.  SLP Short Term Goal 3 (Week 3): Pt will solve semi-complex problem solving tasks with Mod A cues.  SLP Short Term Goal 4 (Week 3): Pt will demonstrate selective attention in moderately distracting environment for 30 minutes with Min A cues.  SLP Short Term Goal 5 (Week 3): Pt will consume puree with  honey thick liquids and minimal s/s of aspiration and Min A for useof compensatory swallow strategies.   Weekly Progress Updates: Pt demonstrated good progress meeting 4 out 5 goals, demonstrating  Min-supervision A for speech intelligibility at word level, Mod A use of swallow strategies, selective attention and max-mod A for semi-complex problem solving. Pt participated in [facility] recommending dys 1 and HTL with silent aspiration on thin liquids, and reduced PO intake/refusal of medication leading to PEG placement ordered for the end of the week. Pt continued to demonstrated prolonged mastication and delay in swallow initation. Pt demonstrated inconsistency in semi-complex problem solving and use of speech intelligibility strategies, however has surpassed word level. Family education has begun for diet, however extensive education is required for PEG tube, thickening liquid and overall ability to function at home, since goal is for daughter to take pt home with her.      Intensity: Minumum of 1-2 x/day, 30 to 90 minutes Frequency: 3 to 5 out of 7 days Duration/Length of Stay: 5/21 Treatment/Interventions: Cognitive remediation/compensation;Cueing hierarchy;Dysphagia/aspiration precaution training;Functional tasks;Patient/family education;Therapeutic Activities   Daily Session  Skilled Therapeutic Interventions: Skilled ST services focused on swallow skills, speech skills and family eductaion. SLP facilitated PO consumption of dys 1 and HTL with breakfast tray, Pt continued to demonstrate oral holding especially during medication administration and prolonged mastication with meal requring Mod A verbal cues. SLP facilitated speech intelligibly at phrase level, initially requring  min A verbal cues (SLP spoke with pt around 7:30 upon waking and demonstrate significant increase in vocal intensity), however throughout session required max-mod A verbal cues, suggesting contribution of cognitive componet to  speech deficits. Pt's daughter was present and both the pt and daughter requested education about plan for diet and continued treatment with upcoming PEG placement. SLP provided extensive education about the importance of continue PO trials to strengthen swallow function, plan for continuing therapy and likely remaining on current die upon discharge with follow up MBS need to adjust diet, all questions were answered to satisfaction. SLP recommends educating daughter in proper thickening liquid to HTL upon next time daughter is present piror to discharge, SLP was unable to due to time restriction. Pt was left in room with call bell within reach. Reccomend to continue skilled ST services.      Function:   Eating Eating   Modified Consistency Diet: Yes Eating Assist Level: Supervision or verbal cues;Set up assist for   Eating Set Up Assist For: Opening containers       Cognition Comprehension Comprehension assist level: Follows basic conversation/direction with extra time/assistive device  Expression   Expression assist level: Expresses basic 50 - 74% of the time/requires cueing 25 - 49% of the time. Needs to repeat parts of sentences.;Expresses basic 25 - 49% of the time/requires cueing 50 - 75% of the time. Uses single words/gestures.;Expresses basic 75 - 89% of the time/requires cueing 10 - 24% of the time. Needs helper to occlude trach/needs to repeat words.  Social Interaction Social Interaction assist level: Interacts appropriately 75 - 89% of the time - Needs redirection for appropriate language or to initiate interaction.  Problem Solving Problem solving assist level: Solves basic 75 - 89% of the time/requires cueing 10 - 24% of the time  Memory Memory assist level: Recognizes or recalls 50 - 74% of the time/requires cueing 25 - 49% of the time   General    Pain Pain Assessment Pain Scale: 0-10 Pain Score: 0-No pain  Therapy/Group: Individual Therapy  Ashlee Bewley  [facility] 07/27/2017,  2:04 PM

## 2017-07-27 NOTE — Progress Notes (Signed)
Occupational Therapy Session Note  Patient Details  Name: Kelly Romero MRN: [id number] Date of Birth: 07/07/1965  Today's Date: 07/27/2017 OT Individual Time: 7829-5621 OT Individual Time Calculation (min): 75 min    Short Term Goals: Week 2:  OT Short Term Goal 1 (Week 2): Pt will ambulate into bathroom with CGA using LRAD. OT Short Term Goal 2 (Week 2): Pt will complete 2 grooming tasks standing at sink with close supervision in order to increase functional activity tolerance OT Short Term Goal 3 (Week 2): Pt will complete 3/3 toileting tasks with guarding assist OT Short Term Goal 4 (Week 2): Pt will use R UE at stabilizer level mod I  Skilled Therapeutic Interventions/Progress Updates:    Pt seen for OT ADL bathing/ressing session. Pt awake in supine upon arrival with daughter present, agreeable to tx session. Pt's daughter remainder present throughout session, however, did not interact and remained on cell phone thorughout. Vitals assessed with mobility, see below for details. She transferred to EOB with min A using hsoiptal bed functions with multimodal cuing for sequencing/ technique. Complete stand pivot transfers throughout session with min A and mod cuing for sequencing.  She bathed at shower level seated on tub transfer bench, indepently initiated use of R UE into functional tasks, however, due to weakness was not able to apply enough pressure with washcloth to throughtly wash and due to limited ROM unable to move hand held shower head to above shoulder height. She stood with grab abrs and steadying assist for buttock hygiene to be completed total A. She returned to w/c to dress, required frequent cuing to follow through with hemi dressing technique for most independence, when following cues able to don shirt at supervision level. She was able to thread B LEs into pants, standing at RW with min A to have pants pulled up total A. Pt left seated in w/c at end of session with QRB  donned, chair alarm activated and all needs in reach with daughter present.  BP in supine: 154/62 BP sitting EOB: 139/55  Therapy Documentation Precautions:  Precautions Precautions: Fall Restrictions Weight Bearing Restrictions: No Pain    No/denies pain  See Function Navigator for Current Functional Status.   Therapy/Group: Individual Therapy  Konstantinos Cordoba L 07/27/2017, 6:55 AM

## 2017-07-27 NOTE — Progress Notes (Signed)
Social Work Patient ID: Kelly Romero, female   DOB: 05/14/1965, 51 y.o.   MRN: [id number]  Met with pt and daughter who was here to discuss family training she will be here Thursday and Friday and will go through therapies with her. Discussing PEG placement unsure whether it will be Thursday or Friday. May know more after team conference tomorrow. Both feel their questions have been answered and addressed.  

## 2017-07-27 NOTE — Progress Notes (Signed)
Physical Therapy Weekly Progress Note  Patient Details  Name: Kelly Romero MRN: [id number] Date of Birth: Apr 19, 1965  Beginning of progress report period: Jul 21, 2017 End of progress report period: Jul 27, 2017  Today's Date: 07/27/2017   Patient has met 0 of 2 short term goals.  Pt has made very poor progress towards all LTG's as pt continues to be limited by significant orthostatic hypotension with positional changes, as well as decreased motivation. Pt's goals have been downgraded to w/c level and transfers only to ensure safety 2/2 orthostatic hypotension. Pt's daughter [name]) has been educated on potential for pt to d/c home at w/c level and been given home measurement sheet to ensure w/c will fit in house. Pt's family will need hands on caregiver training prior to pt's d/c to focus on transfers, w/c mobility, and potentially bumping pt up steps in w/c if family is unable to install a recommended ramp. Pt would benefit from continued skilled PT treatment to focus on transfers, w/c mobility, R NMR, and cognitive remediation. Pt does appear to have limited motivation during sessions.  Patient continues to demonstrate the following deficits muscle weakness, decreased cardiorespiratoy endurance, decreased coordination, decreased visual perceptual skills, decreased attention to right, decreased initiation, decreased attention, decreased awareness, decreased problem solving, decreased safety awareness, decreased memory and delayed processing, and decreased sitting balance, decreased postural control, hemiplegia and decreased balance strategies and therefore will continue to benefit from skilled PT intervention to increase functional independence with mobility.  Patient not progressing toward long term goals.  See goal revision.  Plan of care revisions: ambulatory goals d/c, now anticipate pt will d/c home at w/c level 2/2 significant orthostatic hypotension. Goals are now min assist w/c level &  min assist transfers.  PT Short Term Goals Week 2:  PT Short Term Goal 1 (Week 2): Pt will ambulate 50 ft with LRAD & supervision. PT Short Term Goal 1 - Progress (Week 2): Not met PT Short Term Goal 2 (Week 2): Pt will negotiate 12 steps with B rails and mod assist for strengthening purposes. PT Short Term Goal 2 - Progress (Week 2): Not met Week 3:  PT Short Term Goal 1 (Week 3): STG = LTG due to estimated d/c date.  See Function Navigator for Current Functional Status.  Therapy/Group: Individual Therapy  Waunita Schooner 07/27/2017, 4:59 PM

## 2017-07-27 NOTE — Plan of Care (Signed)
  Problem: Consults Goal: RH STROKE PATIENT EDUCATION Description See Patient Education module for education specifics  Outcome: Progressing   Problem: RH BOWEL ELIMINATION Goal: RH STG MANAGE BOWEL WITH ASSISTANCE Description STG Manage Bowel with mod Assistance.  Outcome: Progressing   Problem: RH BLADDER ELIMINATION Goal: RH STG MANAGE BLADDER WITH ASSISTANCE Description STG Manage Bladder With min. Assistance  Outcome: Progressing   Problem: RH SKIN INTEGRITY Goal: RH STG SKIN FREE OF INFECTION/BREAKDOWN Description With min. assist.  Outcome: Progressing Goal: RH STG MAINTAIN SKIN INTEGRITY WITH ASSISTANCE Description STG Maintain Skin Integrity With min. Assistance.  Outcome: Progressing   Problem: RH SAFETY Goal: RH STG ADHERE TO SAFETY PRECAUTIONS W/ASSISTANCE/DEVICE Description STG Adhere to Safety Precautions With mod.Assistance/Device.  Outcome: Progressing   Problem: RH PAIN MANAGEMENT Goal: RH STG PAIN MANAGED AT OR BELOW PT'S PAIN GOAL Description Less than 3.  Outcome: Progressing   Problem: RH KNOWLEDGE DEFICIT Goal: RH STG INCREASE KNOWLEDGE OF DIABETES Outcome: Progressing Goal: RH STG INCREASE KNOWLEDGE OF HYPERTENSION Outcome: Progressing Goal: RH STG INCREASE KNOWLEDGE OF DYSPHAGIA/FLUID INTAKE Outcome: Progressing

## 2017-07-27 NOTE — Progress Notes (Signed)
Patient's daughter is not returning my voicemails and not present when I visited with patient. Spoke with Dr. Wynn Banker. Patient has agreed with PEG tube placement this week. No further needs at this time. Please re-consult Palliative Medicine Team if future needs arise or if family further requests our support. Thank you.  NO CHARGE  Vennie Homans, FNP-C Palliative Medicine Team  Phone: [phone] Fax: [phone]
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
## 2017-07-27 NOTE — Progress Notes (Signed)
Subjective/Complaints:  Discussed pt's concern about the PEG with Neuropsych, appreciate Palliative note  ROS: Limited by severe dysarthria and cognition   Objective: Vital Signs: Blood pressure (!) 145/51, pulse 76, temperature 98.7 F (37.1 C), temperature source Oral, resp. rate 17, height '5\' 5"'$  (1.651 m), weight 95.4 kg (210 lb 5.1 oz), SpO2 97 %. No results found. Results for orders placed or performed during the hospital encounter of 07/13/17 (from the past 72 hour(s))  Glucose, capillary     Status: Abnormal   Collection Time: 07/24/17  8:57 AM  Result Value Ref Range   Glucose-Capillary 126 (H) 65 - 99 mg/dL  Glucose, capillary     Status: Abnormal   Collection Time: 07/24/17 11:32 AM  Result Value Ref Range   Glucose-Capillary 141 (H) 65 - 99 mg/dL  Glucose, capillary     Status: Abnormal   Collection Time: 07/24/17  5:04 PM  Result Value Ref Range   Glucose-Capillary 109 (H) 65 - 99 mg/dL  Glucose, capillary     Status: None   Collection Time: 07/24/17  9:57 PM  Result Value Ref Range   Glucose-Capillary 91 65 - 99 mg/dL  Glucose, capillary     Status: None   Collection Time: 07/25/17  6:47 AM  Result Value Ref Range   Glucose-Capillary 90 65 - 99 mg/dL  Glucose, capillary     Status: Abnormal   Collection Time: 07/25/17 11:35 AM  Result Value Ref Range   Glucose-Capillary 127 (H) 65 - 99 mg/dL  Glucose, capillary     Status: Abnormal   Collection Time: 07/25/17  4:41 PM  Result Value Ref Range   Glucose-Capillary 119 (H) 65 - 99 mg/dL  Glucose, capillary     Status: Abnormal   Collection Time: 07/25/17  9:03 PM  Result Value Ref Range   Glucose-Capillary 189 (H) 65 - 99 mg/dL  Glucose, capillary     Status: Abnormal   Collection Time: 07/26/17  6:20 AM  Result Value Ref Range   Glucose-Capillary 122 (H) 65 - 99 mg/dL  Glucose, capillary     Status: Abnormal   Collection Time: 07/26/17 12:29 PM  Result Value Ref Range   Glucose-Capillary 188 (H) 65 - 99  mg/dL  Glucose, capillary     Status: Abnormal   Collection Time: 07/26/17  4:39 PM  Result Value Ref Range   Glucose-Capillary 139 (H) 65 - 99 mg/dL  Glucose, capillary     Status: Abnormal   Collection Time: 07/26/17  8:58 PM  Result Value Ref Range   Glucose-Capillary 106 (H) 65 - 99 mg/dL  Basic metabolic panel     Status: Abnormal   Collection Time: 07/27/17  5:13 AM  Result Value Ref Range   Sodium 143 135 - 145 mmol/L   Potassium 3.8 3.5 - 5.1 mmol/L   Chloride 105 101 - 111 mmol/L   CO2 29 22 - 32 mmol/L   Glucose, Bld 124 (H) 65 - 99 mg/dL   BUN 9 6 - 20 mg/dL   Creatinine, Ser 1.03 (H) 0.44 - 1.00 mg/dL   Calcium 9.4 8.9 - 10.3 mg/dL   GFR calc non Af Amer >60 >60 mL/min   GFR calc Af Amer >60 >60 mL/min    Comment: (NOTE) The eGFR has been calculated using the CKD EPI equation. This calculation has not been validated in all clinical situations. eGFR's persistently <60 mL/min signify possible Chronic Kidney Disease.    Anion gap 9 5 - 15  Comment: Performed at [facility], [place] [address] [place], [place] [address]  CBC     Status: Abnormal   Collection Time: 07/27/17  5:13 AM  Result Value Ref Range   WBC 4.7 4.0 - 10.5 K/uL   RBC 3.78 (L) 3.87 - 5.11 MIL/uL   Hemoglobin 10.6 (L) 12.0 - 15.0 g/dL   HCT 33.9 (L) 36.0 - 46.0 %   MCV 89.7 78.0 - 100.0 fL   MCH 28.0 26.0 - 34.0 pg   MCHC 31.3 30.0 - 36.0 g/dL   RDW 12.9 11.5 - 15.5 %   Platelets 229 150 - 400 K/uL    Comment: Performed at [facility], [place] [address], [place], [place] [address]  Protime-INR     Status: None   Collection Time: 07/27/17  5:13 AM  Result Value Ref Range   Prothrombin Time 13.3 11.4 - 15.2 seconds   INR 1.02     Comment: Performed at [facility] [address], [place], [place] [address]  Glucose, capillary     Status: Abnormal   Collection Time: 07/27/17  6:45 AM  Result Value Ref Range   Glucose-Capillary 112 (H) 65 - 99 mg/dL     Constitutional: No  distress . Vital signs reviewed. HEENT: EOMI, oral membranes moist Neck: supple Cardiovascular: RRR without murmur. No JVD    Respiratory: CTA Bilaterally without wheezes or rales. Normal effort    GI: BS +, non-tender, non-distended  Musc: No edema or tenderness in extremities. Skin:   Intact. Warm and dry.    Neuro:  Alert Right facial droop persistent Alert/ oriented to person not time Motor: 4/5 RUE proximal to distal- stable 4+/5 LUE proximal to distal- stable RLE: 3-/5 HF, KE, ADF, 4/5 (stable) LLE: 3-/5 HF, KE, ADF stable Dysarthria Psych: f flat    Assessment/Plan: 1. 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 Physiatrist is providing close team supervision and 24 hour management of active medical problems listed below. Physiatrist and rehab team continue to assess barriers to discharge/monitor patient progress toward functional and medical goals. FIM: Function - Bathing Bathing activity did not occur: Refused Position: Shower Body parts bathed by patient: Chest, Abdomen, Front perineal area, Right upper leg, Left upper leg, Right arm, Left arm Body parts bathed by helper: Right lower leg, Left lower leg, Back, Buttocks  Function- Upper Body Dressing/Undressing What is the patient wearing?: Pull over shirt/dress Pull over shirt/dress - Perfomed by patient: Thread/unthread left sleeve, Put head through opening, Pull shirt over trunk, Thread/unthread right sleeve Pull over shirt/dress - Perfomed by helper: Thread/unthread right sleeve Assist Level: Supervision or verbal cues Function - Lower Body Dressing/Undressing What is the patient wearing?: Pants, Non-skid slipper socks, Ted Hose Position: Sitting EOB Underwear - Performed by helper:  Thread/unthread right underwear leg, Thread/unthread left underwear leg, Pull underwear up/down Pants- Performed by patient: Thread/unthread right pants leg, Thread/unthread left pants leg Pants- Performed by helper: Pull pants up/down Non-skid slipper socks- Performed by helper: Don/doff right sock, Don/doff left sock Socks - Performed by helper: Don/doff right sock, Don/doff left sock Shoes - Performed by patient: Don/doff right shoe, Fasten right Shoes - Performed by helper: Don/doff left shoe, Fasten left, Don/doff right shoe, Fasten right TED Hose - Performed by helper: Don/doff right TED hose,  Don/doff left TED hose Assist for footwear: Maximal assist Assist for lower body dressing: 2 Helpers  Function - Toileting Toileting steps completed by patient: Performs perineal hygiene, Adjust clothing prior to toileting Toileting steps completed by helper: Adjust clothing prior to toileting, Adjust clothing after toileting Toileting Assistive Devices: Grab bar or rail, Other (comment)(RW)  Function - Toilet Transfers Toilet transfer assistive device: Elevated toilet seat/BSC over toilet, Walker Assist level to toilet: Touching or steadying assistance (Pt > 75%) Assist level from toilet: Touching or steadying assistance (Pt > 75%) Assist level to bedside commode (at bedside): Moderate assist (Pt 50 - 74%/lift or lower)(per Leann Nickles-Wright, NT) Assist level from bedside commode (at bedside): Moderate assist (Pt 50 - 74%/lift or lower)  Function - Chair/bed transfer Chair/bed transfer method: Ambulatory Chair/bed transfer assist level: Touching or steadying assistance (Pt > 75%) Chair/bed transfer assistive device: Armrests Chair/bed transfer details: Verbal cues for sequencing, Verbal cues for precautions/safety  Function - Locomotion: Wheelchair Will patient use wheelchair at discharge?: No Type: Manual Wheelchair activity did not occur: Safety/medical concerns Max wheelchair  distance: 50 Assist Level: Touching or steadying assistance (Pt > 75%) Wheel 50 feet with 2 turns activity did not occur: Safety/medical concerns Assist Level: Touching or steadying assistance (Pt > 75%) Wheel 150 feet activity did not occur: Safety/medical concerns Function - Locomotion: Ambulation Assistive device: Walker-rolling Max distance: 10 Assist level: Touching or steadying assistance (Pt > 75%) Assist level: Touching or steadying assistance (Pt > 75%) Assist level: Touching or steadying assistance (Pt > 75%) Walk 150 feet activity did not occur: Safety/medical concerns Assist level: Moderate assist (Pt 50 - 74%) Assist level: Moderate assist (Pt 50 - 74%)  Function - Comprehension Comprehension: Auditory Comprehension assist level: Follows basic conversation/direction with extra time/assistive device  Function - Expression Expression: Verbal Expression assist level: Expresses basic 25 - 49% of the time/requires cueing 50 - 75% of the time. Uses single words/gestures., Expresses basic 75 - 89% of the time/requires cueing 10 - 24% of the time. Needs helper to occlude trach/needs to repeat words.  Function - Social Interaction Social Interaction assist level: Interacts appropriately 75 - 89% of the time - Needs redirection for appropriate language or to initiate interaction.  Function - Problem Solving Problem solving assist level: Solves basic 75 - 89% of the time/requires cueing 10 - 24% of the time  Function - Memory Memory assist level: Recognizes or recalls 50 - 74% of the time/requires cueing 25 - 49% of the time Patient normally able to recall (first 3 days only): Current season, That he or she is in a hospital, Location of own room   Medical Problem List and Plan:  1. Right hemiparesis and functional deficits secondary to left periventricular white matter and right frontal lobe infarcts    Cont CIR -  PT, OT, SLP Team conf in am  2. DVT  Prophylaxis/Anticoagulation: Pharmaceutical: Lovenox '40mg'$  q 24h 3. Chronic HA/Pain Management: Depakote bid with tylenol prn.  Lower extremity paresthesias  Trial of Cymbalta 4. Mood: LCSW to follow for evaluation and support.    Emotional lability trial of Cymbalta.    If this is not helpful may consider SSRI 5. Neuropsych: This patient is not fully capable of making decisions on her own behalf. Appreciate input   6. Skin/Wound Care: routine pressure relief measures.  7. Fluids/Electrolytes/Nutrition: Monitor I/Os  8. 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 Monitor  BS ac/hs. Educate on importance of compliance.  CBG (last 3)  Recent Labs    07/26/17 1639 07/26/17 2058 07/27/17 0645  GLUCAP 139* 106* 112*    controlled on 5/14 9. Dyslipidemia: On Crestor.  10. Anemia: Hgb stable at 9.5 on 5/1, improved to 10.6 on 5/14 11. HTN with orthostasis- will check q shift, no syncope x 3 d   Monitor BP bid.   -no orthostasis today  -bp's have been trending too high  -lisinopril is '20mg'$  bid  -added low dose metoprolol 12.'5mg'$  5/11 with some improvement Vitals:   07/26/17 2005 07/27/17 0503  BP: (!) 197/77 (!) 145/51  Pulse: 73 76  Resp: 17 17  Temp: 98.3 F (36.8 C) 98.7 F (37.1 C)  SpO2: 99% 97%   Ordered TEDs  Abdominal binder    Continue to monitor- IVF per Cardiology 12. Hypoalbuminemia  Supplement initiated on 5/4 13.  AMS probable seizure,CT neg forhemmorhagic transformation, EEG neg for seizure , slowing noted,  On Depakene, reviewed Neuro outpt note, possible seizure in 2017 , has also been treated for migraines per outpt Neuro Dr Tomi Likens, added Keppra to Valproate depakene 14.  Mild abd tenderness improved KUB neg 15.  Dysphagia moderately severe- supplement fluids with IV at noc  Decision per pt and daughter to proceed wit PEG- still has long term goal of po feeds  -have held plavix (last  dose received 5/11) in expectation of placement this week 5/16 or 5/17        LOS (Days) Rosston E Madysun Thall 07/27/2017, 8:44 AM

## 2017-07-27 NOTE — Progress Notes (Signed)
Physical Therapy Session Note  Patient Details  Name: Kelly Romero MRN: [id number] Date of Birth: 1965/11/04  Today's Date: 07/27/2017 PT Individual Time: 1132-1201 and 1191-4782 PT Individual Time Calculation (min): 29 min and 42 min   Short Term Goals: Week 2:  PT Short Term Goal 1 (Week 2): Pt will ambulate 50 ft with LRAD & supervision. PT Short Term Goal 2 (Week 2): Pt will negotiate 12 steps with B rails and mod assist for strengthening purposes.  Skilled Therapeutic Interventions/Progress Updates:  Treatment 1: Pt received in w/c with daughter [name]) present in room. Session focused on pt/family education. Therapist provided [name] with home measurement sheet to complete in case pt has to d/c home at a w/c level 2/2 orthostatic hypotension & limited progress. Educated pt & family on pt's current level of function, limited participation (2/2 motivation & BP/medical issues), and anticipated PT goals. Educated pt on supervision level ambulation goals and ability to for pt to achieve those goals with increased participation in therapy. Also educated pt on benefits of taking prescribed medication, as well as adverse affects if she does not take them as scheduled, need to increase meal consumption in order to have energy to participate in therapy, and discussed pt's goals. Pt reports she is wanting to "give up" but then able to report her goal is to go home and be able to go outside with her grandchildren. Therapist provided education and encouragement to work towards that goal. Pt left sitting in w/c in room with quick release belt & chair alarm donned, needs within reach & daughter present in room.   Treatment 2: Pt received in w/c & agreeable to tx. No c/o pain reported but pt with c/o dizziness with standing. Transported pt to gym via w/c total assist and BP assessed, see below. Pt eyes closing with less verbal communication and assisted back to sitting in w/c, RN arrived, made aware of BP &  cleared pt for continuation in therapy. Pt easily distracted in busy gym environment. PA arrived and notified of orthostatic vitals; per PA pt needs to increase food & fluid intake to help manage BP. At end of session pt left sitting in w/c in room with quick release belt & chair alarm donned & all needs within reach.   Vitals:  Sitting in w/c with thigh high teds donned: 158/68 mmHg (LUE), HR = 70 bpm Standing with thigh high teds & abdominal binder: BP = 54/35 mmHg, HR = 90 bpm -- unsure of accuracy 2/2 pt gripping to RW with LUE Immediately returned to sitting: BP =  157/68 mmHg, HR = 74 bpm Standing with thigh high teds & abdominal binder: BP = 175/134 mmHg, HR = 71 bpm (pt's eyes beginning to close & pt assisted to sitting)  RN arrived & notified of BP Return to sitting: BP = 162/70 mmHg, HR = 74 bpm   Therapy Documentation Precautions:  Precautions Precautions: Fall Restrictions Weight Bearing Restrictions: No   See Function Navigator for Current Functional Status.   Therapy/Group: Individual Therapy  Sandi Mariscal 07/27/2017, 1:55 PM

## 2017-07-28 ENCOUNTER — Inpatient Hospital Stay (HOSPITAL_COMMUNITY): Payer: Medicaid Other | Admitting: Speech Pathology

## 2017-07-28 ENCOUNTER — Inpatient Hospital Stay (HOSPITAL_COMMUNITY): Payer: Medicaid Other | Admitting: Occupational Therapy

## 2017-07-28 ENCOUNTER — Inpatient Hospital Stay (HOSPITAL_COMMUNITY): Payer: Medicaid Other | Admitting: Physical Therapy

## 2017-07-28 DIAGNOSIS — I69320 Aphasia following cerebral infarction: Secondary | ICD-10-CM

## 2017-07-28 LAB — GLUCOSE, CAPILLARY
GLUCOSE-CAPILLARY: 121 mg/dL — AB (ref 65–99)
GLUCOSE-CAPILLARY: 203 mg/dL — AB (ref 65–99)
GLUCOSE-CAPILLARY: 119 mg/dL — AB (ref 65–99)
Glucose-Capillary: 185 mg/dL — ABNORMAL HIGH (ref 65–99)

## 2017-07-28 MED ORDER — LISINOPRIL 10 MG PO TABS
10.0000 mg | ORAL_TABLET | Freq: Two times a day (BID) | ORAL | Status: DC
  Administered 2017-07-28 – 2017-08-06 (×13): 10 mg via ORAL
  Filled 2017-07-28 (×16): qty 1

## 2017-07-28 NOTE — Progress Notes (Signed)
Occupational Therapy Weekly Progress Note  Patient Details  Name: Kelly Romero MRN: [id number] Date of Birth: 06-01-1965  Beginning of progress report period: Jul 21, 2017 End of progress report period: Jul 28, 2017  Today's Date: 07/28/2017 OT Individual Time: 1345-1415 OT Individual Time Calculation (min): 30 min    Patient has met 1 of 4 short term goals.  Pt cont to make very limited progress towards OT goals due to postural hypotension and decreased functional activity tolerance due to limited P.O. Intake. Pt has been unable to tolerate standing > ~1 minute at a time during this reporting period secondary to complaints of dizziness with extended standing, pt with hx of multiple syncope episodes during this admission stay. She is unreliable in her ability to accurately report symptoms of hypotension. Pt cont to have severe R UE weakness and apraxia also impacting her independence with ADLs.  Pt's daughter has been present for one OT session during this reporting period though she did not assist with any hands on care. Hands on family education has been scheduled for the upcoming days in prep for planned d/c home next week.  Discussed with CSW and rest of therapy team pottenial extension of LOS following pt's PEG placement in hopes of improved level of function with increased activity tolerance.   Patient continues to demonstrate the following deficits:  apraxia, ataxia, cognitive deficits, disturbance of vision, hemiplegia affecting dominant side and muscle weakness (generalized) and therefore will continue to benefit from skilled OT intervention to enhance overall performance with BADL and Reduce care partner burden.  Patient not progressing toward long term goals.  See goal revision..  Plan of care revisions: Goals have been downgraded to min-mod A overall. See POC for goal details. .  OT Short Term Goals Week 2:  OT Short Term Goal 1 (Week 2): Pt will ambulate into bathroom with CGA  using LRAD. OT Short Term Goal 1 - Progress (Week 2): Not met OT Short Term Goal 2 (Week 2): Pt will complete 2 grooming tasks standing at sink with close supervision in order to increase functional activity tolerance OT Short Term Goal 2 - Progress (Week 2): Not met OT Short Term Goal 3 (Week 2): Pt will complete 3/3 toileting tasks with guarding assist OT Short Term Goal 3 - Progress (Week 2): Not met OT Short Term Goal 4 (Week 2): Pt will use R UE at stabilizer level mod I OT Short Term Goal 4 - Progress (Week 2): Met Week 3:  OT Short Term Goal 1 (Week 3): STG=LTG due to LOS  Skilled Therapeutic Interventions/Progress Updates:    Pt seen for OT session focusing on upright tolerance and transfer. Pt asleep in supine upon arrival, easily awoken and agreeable to tx session. BP assessed throughout session, see below for details.  Pt transferred to EOB with min A using hospital bed functions with multimodal cuing, assist provided to adjust hips towards EOB. Seated EOB, pt assisted with donning and fastening B shoes. Assist to obtain and maintain figure four sitting position with R LE. Assist required for dynamic sitting balance when attempting to obtain positions. Pt incorporating R UE into functional tasks independently throughout session though still limited in functional use by decreased strength and apraxia.  TED hose and abdominal binder applied prior to transfer to w/c. Mod A stand pivot transfer completed to w/c. Pt left seated in w/c at end of session, QRB donned, chair alarm on and all needs in reach. RN made aware of  pt's position.  Cardiac PA came during session, discussed pt's decline in level of performance, hypotensive episodes during rehab admission and questioning pt's medical stability for planned d/c home next Tuesday.  Therapy Documentation Precautions:  Precautions Precautions: Fall Restrictions Weight Bearing Restrictions: No Pain:   No/denies pain  See Function Navigator  for Current Functional Status.   Therapy/Group: Individual Therapy  Amada Hallisey L 07/28/2017, 7:04 AM

## 2017-07-28 NOTE — Progress Notes (Signed)
Social Work Patient ID: Kelly Romero, female   DOB: 19-Feb-1966, 52 y.o.   MRN: [id number]  Met with pt, daughter and son in-law to discuss team conference goals min assist level and discharge still 5/21. PEG to be placed tomorrow unsure of the time. Have scheduled family training for Friday am with both daughter and son in-law. Will work on discharge needs-equipment and follow up. Pt wants PEG over with and done she will be relieved once over with. Family aware pt will require 24 hr care at home.

## 2017-07-28 NOTE — Progress Notes (Addendum)
Progress Note  Patient Name: Kelly Romero Date of Encounter: 07/28/2017  Primary Cardiologist: Nanetta Batty, MD   Subjective   Dizziness upon standing. Plan to PEG tomorrow.   Inpatient Medications    Scheduled Meds: . aspirin EC  81 mg Oral Daily  . DULoxetine  30 mg Oral Daily  . enoxaparin (LOVENOX) injection  40 mg Subcutaneous Q24H  . feeding supplement (PRO-STAT SUGAR FREE 64)  30 mL Oral BID  . insulin aspart  0-15 Units Subcutaneous TID AC & HS  . insulin detemir  20 Units Subcutaneous Daily  . lisinopril  10 mg Oral BID  . metoprolol tartrate  12.5 mg Oral BID  . rosuvastatin  40 mg Oral QHS  . simethicone  80 mg Oral QID  . Valproate Sodium  500 mg Oral BID   Continuous Infusions: . sodium chloride 0.9 % 1,000 mL with potassium chloride 20 mEq infusion Stopped (07/28/17 0518)   PRN Meds: acetaminophen, alum & mag hydroxide-simeth, bisacodyl, diphenhydrAMINE, guaiFENesin-dextromethorphan, polyethylene glycol, prochlorperazine **OR** prochlorperazine **OR** prochlorperazine, sodium phosphate   Vital Signs    Vitals:   07/27/17 2130 07/28/17 0409 07/28/17 0831 07/28/17 1200  BP: (!) 185/64 (!) 152/66 118/71 (!) 203/76  Pulse: 75 76 96   Resp: 18 16    Temp: 97.6 F (36.4 C) 97.7 F (36.5 C)    TempSrc: Oral Oral    SpO2: 100% 97%    Weight:  210 lb 1.6 oz (95.3 kg)    Height:        Intake/Output Summary (Last 24 hours) at 07/28/2017 1410 Last data filed at 07/28/2017 1300 Gross per 24 hour  Intake 79 ml  Output -  Net 79 ml   Filed Weights   07/19/17 0134 07/20/17 1804 07/28/17 0409  Weight: 211 lb 13.8 oz (96.1 kg) 210 lb 5.1 oz (95.4 kg) 210 lb 1.6 oz (95.3 kg)    Telemetry  Not on tele  ECG    n/a  Physical Exam   GEN: No acute distress.   Neck: No JVD Cardiac: RRR, no murmurs, rubs, or gallops.  Respiratory: Clear to auscultation bilaterally. GI: Soft, nontender, non-distended  MS: No edema; . Neuro:  R sided weakness, R  facial droop. Difficult to understand speech Psych: flat affect   Labs    Chemistry Recent Labs  Lab 07/27/17 0513  NA 143  K 3.8  CL 105  CO2 29  GLUCOSE 124*  BUN 9  CREATININE 1.03*  CALCIUM 9.4  GFRNONAA >60  GFRAA >60  ANIONGAP 9     Hematology Recent Labs  Lab 07/27/17 0513  WBC 4.7  RBC 3.78*  HGB 10.6*  HCT 33.9*  MCV 89.7  MCH 28.0  MCHC 31.3  RDW 12.9  PLT 229    Radiology    No results found.  Cardiac Studies   Echo 07/09/17 Study Conclusions  - Left ventricle: The cavity size was normal. Systolic function was   normal. The estimated ejection fraction was in the range of 60%   to 65%. Wall motion was normal; there were no regional wall   motion abnormalities. Left ventricular diastolic function   parameters were normal. - Mitral valve: There was mild regurgitation. - Right ventricle: Systolic function was normal. - Pulmonary arteries: Systolic pressure was within the normal   range. - Pericardium, extracardiac: A trivial pericardial effusion was   identified.  Impressions:  - Incomplete study as patient started having panic attack and   requested  rest of echo be stopped.  Patient Profile     52 y.o. female with hx of recent CVA, vascular disease, diabetes, HTN and HLD called again for recurrent orthostatic hypotension.   Admitted end of April with acute stroke and transferred to inpatient rehab. ILR has been placed. Cardiology has seen the patient 5/7 for syncope. Felt positional due to orthostasis.  Symptoms improved after IV hydration. We signed off 07/22/17. Called again today with orthostatis and dizziness.   Assessment & Plan    1. Orthostatic hypotension - Spoke with OT>> patient is doing reasonably well since last week. Continues to feel dizzy with sitting and standing position from laying. Difficult situation. Her SBP dropped to 80s from 140s this morning with standing and felt dizzy. She had abdominal binder but not TED. She  has elevated blood pressure at base line. She is on metoprolol 12.5mg  BID. She was on lisinopril  BID>> changed to  BID today. Likely her orthostatic is due to poor po intake. Plan for PEG tomorrow. Hopefully improves with time. Continue current meds, abdominal binder and TED. If recurrent issue, consider IV hydration.   For questions or updates, please contact [facility] Please consult [email or web address] for contact info under Cardiology/STEMI.      SignedSharrell Ku [place], PA  07/28/2017, 2:10 PM    Patient examined chart reviewed Discussed care with patient and PA. Her orthostasis is not a cardiac issue TTE normal EF no valve disease. Related to DM, CVA and lack of activity with poor PO intake. Exam with some Facial droop and weak all over right side worse.  Clear lungs and no murmur She is to have PEG with IR tomorrow Would rather have her BP on high side than too low for procedure will likely drop with sedation/anesthesia and can use iv hydralazine if she is over 200 mmHg in supine position for a period of time. Will be hard for her to wear abdominal binder post PEG.   Charlton Haws

## 2017-07-28 NOTE — Progress Notes (Signed)
Daughter and pt are concerned that blood pressure being up and down may affect the procedure tomorrow. Pam, PA is made aware of family concerns. PA consulted cardiology. Continue plan of care.

## 2017-07-28 NOTE — Progress Notes (Deleted)
Subjective/Complaints:    ROS: Limited by severe dysarthria and cognition   Objective: Vital Signs: Blood pressure 118/71, pulse 96, temperature 97.7 F (36.5 C), temperature source Oral, resp. rate 16, height _0  (1.651 m), weight 95.3 kg (210 lb 1.6 oz), SpO2 97 %. No results found. Results for orders placed or performed during the hospital encounter of 07/13/17 (from the past 72 hour(s))  Glucose, capillary     Status: Abnormal   Collection Time: 07/25/17 11:35 AM  Result Value Ref Range   Glucose-Capillary 127 (H) 65 - 99 mg/dL  Glucose, capillary     Status: Abnormal   Collection Time: 07/25/17  4:41 PM  Result Value Ref Range   Glucose-Capillary 119 (H) 65 - 99 mg/dL  Glucose, capillary     Status: Abnormal   Collection Time: 07/25/17  9:03 PM  Result Value Ref Range   Glucose-Capillary 189 (H) 65 - 99 mg/dL  Glucose, capillary     Status: Abnormal   Collection Time: 07/26/17  6:20 AM  Result Value Ref Range   Glucose-Capillary 122 (H) 65 - 99 mg/dL  Glucose, capillary     Status: Abnormal   Collection Time: 07/26/17 12:29 PM  Result Value Ref Range   Glucose-Capillary 188 (H) 65 - 99 mg/dL  Glucose, capillary     Status: Abnormal   Collection Time: 07/26/17  4:39 PM  Result Value Ref Range   Glucose-Capillary 139 (H) 65 - 99 mg/dL  Glucose, capillary     Status: Abnormal   Collection Time: 07/26/17  8:58 PM  Result Value Ref Range   Glucose-Capillary 106 (H) 65 - 99 mg/dL  Basic metabolic panel     Status: Abnormal   Collection Time: 07/27/17  5:13 AM  Result Value Ref Range   Sodium 143 135 - 145 mmol/L   Potassium 3.8 3.5 - 5.1 mmol/L   Chloride 105 101 - 111 mmol/L   CO2 29 22 - 32 mmol/L   Glucose, Bld 124 (H) 65 - 99 mg/dL   BUN 9 6 - 20 mg/dL   Creatinine, Ser 1.03 (H) 0.44 - 1.00 mg/dL   Calcium 9.4 8.9 - 10.3 mg/dL   GFR calc non Af Amer >60 >60 mL/min   GFR calc Af Amer >60 >60 mL/min    Comment: (NOTE) The eGFR has been calculated using the  CKD EPI equation. This calculation has not been validated in all clinical situations. eGFR's persistently <60 mL/min signify possible Chronic Kidney Disease.    Anion gap 9 5 - 15    Comment: Performed at [facility] [address] [place], [place] [address]  CBC     Status: Abnormal   Collection Time: 07/27/17  5:13 AM  Result Value Ref Range   WBC 4.7 4.0 - 10.5 K/uL   RBC 3.78 (L) 3.87 - 5.11 MIL/uL   Hemoglobin 10.6 (L) 12.0 - 15.0 g/dL   HCT 33.9 (L) 36.0 - 46.0 %   MCV 89.7 78.0 - 100.0 fL   MCH 28.0 26.0 - 34.0 pg   MCHC 31.3 30.0 - 36.0 g/dL   RDW 12.9 11.5 - 15.5 %   Platelets 229 150 - 400 K/uL    Comment: Performed at [facility], [place] [address] [place],  [address]  Protime-INR     Status: None   Collection Time: 07/27/17  5:13 AM  Result Value Ref Range   Prothrombin Time 13.3 11.4 - 15.2 seconds   INR 1.02  Comment: Performed at [facility], [place] [address] [place], [place] [address]  Glucose, capillary     Status: Abnormal   Collection Time: 07/27/17  6:45 AM  Result Value Ref Range   Glucose-Capillary 112 (H) 65 - 99 mg/dL  Glucose, capillary     Status: Abnormal   Collection Time: 07/27/17 11:46 AM  Result Value Ref Range   Glucose-Capillary 177 (H) 65 - 99 mg/dL  Glucose, capillary     Status: Abnormal   Collection Time: 07/27/17  5:11 PM  Result Value Ref Range   Glucose-Capillary 131 (H) 65 - 99 mg/dL  Glucose, capillary     Status: Abnormal   Collection Time: 07/27/17  9:32 PM  Result Value Ref Range   Glucose-Capillary 159 (H) 65 - 99 mg/dL  Glucose, capillary     Status: Abnormal   Collection Time: 07/28/17  6:15 AM  Result Value Ref Range   Glucose-Capillary 121 (H) 65 - 99 mg/dL     Constitutional: No distress . Vital signs reviewed. HEENT: EOMI, oral membranes moist Neck: supple Cardiovascular: RRR without murmur. No JVD    Respiratory: CTA Bilaterally without wheezes or rales. Normal effort    GI: BS +,  non-tender, non-distended  Musc: No edema or tenderness in extremities. Skin:   Intact. Warm and dry.    Neuro:  Alert Right facial droop persistent Alert/ oriented to person not time Motor: 4/5 RUE proximal to distal- stable 4+/5 LUE proximal to distal- stable RLE: 3-/5 HF, KE, ADF, 4/5 (stable) LLE: 3-/5 HF, KE, ADF stable Dysarthria Psych: f flat    Assessment/Plan: 1. 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 Physiatrist is providing close team supervision and 24 hour management of active medical problems listed below. Physiatrist and rehab team continue to assess barriers to discharge/monitor patient progress toward functional and medical goals. FIM: Function - Bathing Bathing activity did not occur: Refused Position: Shower Body parts bathed by patient: Chest, Abdomen, Front perineal area, Right upper leg, Left upper leg, Right arm, Left arm Body parts bathed by helper: Right lower leg, Left lower leg, Back, Buttocks  Function- Upper Body Dressing/Undressing What is the patient wearing?: Pull over shirt/dress Pull over shirt/dress - Perfomed by patient: Thread/unthread left sleeve, Put head through opening, Pull shirt over trunk, Thread/unthread right sleeve Pull over shirt/dress - Perfomed by helper: Thread/unthread right sleeve Assist Level: Supervision or verbal cues Function - Lower Body Dressing/Undressing What is the patient wearing?: Pants, Maryln Manuel, Shoes Position: Education officer, museum at [employer] - Performed by helper: Thread/unthread right underwear leg, Thread/unthread left underwear leg, Pull underwear up/down Pants- Performed by patient: Thread/unthread right pants leg, Thread/unthread left pants leg Pants- Performed by  helper: Pull pants up/down Non-skid slipper socks- Performed by helper: Don/doff right sock, Don/doff left sock Socks - Performed by helper: Don/doff right sock, Don/doff left sock Shoes - Performed by patient: Don/doff right shoe, Fasten right Shoes - Performed by helper: Don/doff left shoe, Fasten left, Don/doff right shoe, Fasten right TED Hose - Performed by helper: Don/doff right TED hose, Don/doff left TED hose Assist for footwear: Maximal assist Assist for lower body dressing: 2 Helpers  Function - Toileting Toileting steps completed by patient: Performs perineal hygiene, Adjust clothing prior to toileting Toileting steps completed by helper: Adjust clothing prior to toileting,  Adjust clothing after toileting Toileting Assistive Devices: Grab bar or rail, Other (comment)(RW)  Function - Toilet Transfers Toilet transfer assistive device: Elevated toilet seat/BSC over toilet, Walker Assist level to toilet: Touching or steadying assistance (Pt > 75%) Assist level from toilet: Touching or steadying assistance (Pt > 75%) Assist level to bedside commode (at bedside): Moderate assist (Pt 50 - 74%/lift or lower)(per Leann Nickles-Wright, NT) Assist level from bedside commode (at bedside): Moderate assist (Pt 50 - 74%/lift or lower)  Function - Chair/bed transfer Chair/bed transfer method: Ambulatory Chair/bed transfer assist level: Touching or steadying assistance (Pt > 75%) Chair/bed transfer assistive device: Armrests Chair/bed transfer details: Verbal cues for sequencing, Verbal cues for precautions/safety  Function - Locomotion: Wheelchair Will patient use wheelchair at discharge?: No Type: Manual Wheelchair activity did not occur: Safety/medical concerns Max wheelchair distance: 50 Assist Level: Touching or steadying assistance (Pt > 75%) Wheel 50 feet with 2 turns activity did not occur: Safety/medical concerns Assist Level: Touching or steadying assistance (Pt > 75%) Wheel  150 feet activity did not occur: Safety/medical concerns Function - Locomotion: Ambulation Assistive device: Walker-rolling Max distance: 10 Assist level: Touching or steadying assistance (Pt > 75%) Assist level: Touching or steadying assistance (Pt > 75%) Assist level: Touching or steadying assistance (Pt > 75%) Walk 150 feet activity did not occur: Safety/medical concerns Assist level: Moderate assist (Pt 50 - 74%) Assist level: Moderate assist (Pt 50 - 74%)  Function - Comprehension Comprehension: Auditory Comprehension assist level: Follows basic conversation/direction with extra time/assistive device  Function - Expression Expression: Verbal Expression assist level: Expresses basic 50 - 74% of the time/requires cueing 25 - 49% of the time. Needs to repeat parts of sentences., Expresses basic 25 - 49% of the time/requires cueing 50 - 75% of the time. Uses single words/gestures., Expresses basic 75 - 89% of the time/requires cueing 10 - 24% of the time. Needs helper to occlude trach/needs to repeat words.  Function - Social Interaction Social Interaction assist level: Interacts appropriately 75 - 89% of the time - Needs redirection for appropriate language or to initiate interaction.  Function - Problem Solving Problem solving assist level: Solves basic 75 - 89% of the time/requires cueing 10 - 24% of the time  Function - Memory Memory assist level: Recognizes or recalls 50 - 74% of the time/requires cueing 25 - 49% of the time Patient normally able to recall (first 3 days only): Current season, That he or she is in a hospital, Location of own room   Medical Problem List and Plan:  1. Right hemiparesis and functional deficits secondary to left periventricular white matter and right frontal lobe infarcts    Cont CIR -  PT, OT, SLP Team conference today please see physician documentation under team conference tab, met with team face-to-face to discuss problems,progress, and goals.  Formulized individual treatment plan based on medical history, underlying problem and comorbidities.  2. DVT Prophylaxis/Anticoagulation: Pharmaceutical: Lovenox 43m q 24h 3. Chronic HA/Pain Management: Depakote bid with tylenol prn.  Lower extremity paresthesias  Trial of Cymbalta 4. Mood: LCSW to follow for evaluation and support.    Emotional lability trial of Cymbalta.    If this is not helpful may consider SSRI 5. Neuropsych: This patient is not fully capable of making decisions on her own behalf. Appreciate input   6. Skin/Wound Care: routine pressure relief measures.  7. Fluids/Electrolytes/Nutrition: Monitor I/Os  8. 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 Monitor BS ac/hs. Educate on importance of compliance.  CBG (last 3)  Recent Labs    07/27/17 1711 07/27/17 2132 07/28/17 0615  GLUCAP 131* 159* 121*    controlled on 5/15 9. Dyslipidemia: On Crestor.  10. Anemia: Hgb stable at 9.5 on 5/1, improved to 10.6 on 5/14 11. HTN with orthostasis- will check q shift, no syncope x 4 d   Monitor BP bid.   -no orthostasis today  -bp's have been trending too high  -lisinopril is 72m bid, hold this am and will likely need to reduce dose , need to measure BPs in sitting position not while in bed.  Ask for card input  -added low dose metoprolol 12.5100m5/11 with some improvement Vitals:   07/28/17 0409 07/28/17 0831  BP: (!) 152/66 118/71  Pulse: 76 96  Resp: 16   Temp: 97.7 F (36.5 C)   SpO2: 97%    Ordered TEDs  Abdominal binder    Continue to monitor- IVF per Cardiology, should be able to d/c once PEG is place and free H20 can be administered Would treat blood pressure based on sitting values 12. Hypoalbuminemia  Supplement initiated on 5/4 13.  AMS probable seizure,CT neg forhemmorhagic transformation, EEG neg for seizure , slowing noted,  On Depakene, reviewed Neuro outpt note,  possible seizure in 2017 , has also been treated for migraines per outpt Neuro Dr JaTomi Likensadded Keppra to Valproate depakene however the "seizure" may have been near syncope from orthostatic event 14.  Mild abd tenderness improved KUB neg 15.  Dysphagia moderately severe- supplement fluids with IV at noc  Decision per pt and daughter to proceed wit PEG- still has long term goal of po feeds  -have held plavix (last dose received 5/11) in expectation of placement this week 5/16 or 5/17        LOS (Days) 15Ross Kirsteins 07/28/2017, 9:52 AM

## 2017-07-28 NOTE — Progress Notes (Addendum)
Speech Language Pathology Daily Session Note  Patient Details  Name: Kelly Romero MRN: [id number] Date of Birth: 1965-11-02  Today's Date: 07/28/2017   Skilled treatment session #1 SLP Individual Time: 0730-0830 SLP Individual Time Calculation (min): 60 min   Skilled treatment session #2 SLP Individual Time: 0930-1030 SLP Individual Time Calculation (min): 60 min  Short Term Goals: Week 3: SLP Short Term Goal 1 (Week 3): Pt will utilize word finding strategies to convey basic thoughts and ideas with supervision A cues.  SLP Short Term Goal 2 (Week 3): Pt will utilize speech intelligibility strategies with Mod A  to increase speech intelligibility to ~ 90% at the phrase level.  SLP Short Term Goal 3 (Week 3): Pt will solve semi-complex problem solving tasks with Mod A cues.  SLP Short Term Goal 4 (Week 3): Pt will demonstrate selective attention in moderately distracting environment for 30 minutes with Min A cues.  SLP Short Term Goal 5 (Week 3): Pt will consume puree with honey thick liquids and minimal s/s of aspiration and Min A for useof compensatory swallow strategies.   Skilled Therapeutic Interventions:   Skilled treatment session #1 focused on dysphagia and cognition goals. SLP facilitated session by providing skilled observation of pt consuming dysphagia 1 breakfast with honey thick liquids with dysphagia 2 items supplemented. Pt with more than a reasonable amount of time to self-feed d/t task initiation, motor planning, selective attention and likely overall dislike of food. SLP provided Max A multimodal cues to aid in task initiation, support for motor planning etc and pt is not able to change rate of consumption. In ~ 45 minutes, pt consumed 6 bites of food (including graham crackers). Pt required multiple finger sweeps after each bite of graham crackers to clear moderate oral residue. Even with "regular" tasting foods (graham crackers) pt with crying throughout consumption.  Emotional support given. Given all of these deficits, it is not likely that pt will be able to feed herself at a rate that is nutritionally sustainable. Pt was returned to room, left upright in wheelchair with chair alarm on and all needs within reach. Continue per current plan of care.   Skilled treatment session #2 focused on cognition and education with daughter as well as son-in-law. Daughter and son-in-law with no questions regarding swallow dysfunction and PEG placement on 07/29/17. Pt had abdominal binder un-velcro in bed and appeared very confused with use. SLP provided education on need for binder and ted hose to be in place during any transfers or standing d/t orthostatic hypotension. Daughter and son-in-law voiced understanding. Will continue to follow up for instructions on thickening liquids and diet at discharge.      Function:  Eating Eating   Modified Consistency Diet: Yes Eating Assist Level: Supervision or verbal cues;Set up assist for;More than reasonable amount of time;Helper checks for pocketed food;Help managing cup/glass   Eating Set Up Assist For: Opening containers       Cognition Comprehension Comprehension assist level: Follows basic conversation/direction with extra time/assistive device  Expression   Expression assist level: Expresses basic 50 - 74% of the time/requires cueing 25 - 49% of the time. Needs to repeat parts of sentences.;Expresses basic 25 - 49% of the time/requires cueing 50 - 75% of the time. Uses single words/gestures.  Social Interaction Social Interaction assist level: Interacts appropriately 75 - 89% of the time - Needs redirection for appropriate language or to initiate interaction.  Problem Solving Problem solving assist level: Solves basic 75 - 89%  of the time/requires cueing 10 - 24% of the time  Memory Memory assist level: Recognizes or recalls 50 - 74% of the time/requires cueing 25 - 49% of the time    Pain Pain Assessment Faces Pain  Scale: No hurt  Therapy/Group: Individual Therapy  Elyshia Kumagai 07/28/2017, 10:14 AM

## 2017-07-28 NOTE — Progress Notes (Signed)
Subjective/Complaints:  Episode this morning of severe dizziness with standing after BP dropped from 140s sys to 80s sys.  Had abd binder but no TED hose on.  IV fell out ~6am , no IVF since  ROS: Limited by severe dysarthria and cognition   Objective: Vital Signs: Blood pressure (!) 152/66, pulse 76, temperature 97.7 F (36.5 C), temperature source Oral, resp. rate 16, height 5' 5" (1.651 m), weight 95.3 kg (210 lb 1.6 oz), SpO2 97 %. No results found. Results for orders placed or performed during the hospital encounter of 07/13/17 (from the past 72 hour(s))  Glucose, capillary     Status: Abnormal   Collection Time: 07/25/17 11:35 AM  Result Value Ref Range   Glucose-Capillary 127 (H) 65 - 99 mg/dL  Glucose, capillary     Status: Abnormal   Collection Time: 07/25/17  4:41 PM  Result Value Ref Range   Glucose-Capillary 119 (H) 65 - 99 mg/dL  Glucose, capillary     Status: Abnormal   Collection Time: 07/25/17  9:03 PM  Result Value Ref Range   Glucose-Capillary 189 (H) 65 - 99 mg/dL  Glucose, capillary     Status: Abnormal   Collection Time: 07/26/17  6:20 AM  Result Value Ref Range   Glucose-Capillary 122 (H) 65 - 99 mg/dL  Glucose, capillary     Status: Abnormal   Collection Time: 07/26/17 12:29 PM  Result Value Ref Range   Glucose-Capillary 188 (H) 65 - 99 mg/dL  Glucose, capillary     Status: Abnormal   Collection Time: 07/26/17  4:39 PM  Result Value Ref Range   Glucose-Capillary 139 (H) 65 - 99 mg/dL  Glucose, capillary     Status: Abnormal   Collection Time: 07/26/17  8:58 PM  Result Value Ref Range   Glucose-Capillary 106 (H) 65 - 99 mg/dL  Basic metabolic panel     Status: Abnormal   Collection Time: 07/27/17  5:13 AM  Result Value Ref Range   Sodium 143 135 - 145 mmol/L   Potassium 3.8 3.5 - 5.1 mmol/L   Chloride 105 101 - 111 mmol/L   CO2 29 22 - 32 mmol/L   Glucose, Bld 124 (H) 65 - 99 mg/dL   BUN 9 6 - 20 mg/dL   Creatinine, Ser 1.03 (H) 0.44 - 1.00  mg/dL   Calcium 9.4 8.9 - 10.3 mg/dL   GFR calc non Af Amer >60 >60 mL/min   GFR calc Af Amer >60 >60 mL/min    Comment: (NOTE) The eGFR has been calculated using the CKD EPI equation. This calculation has not been validated in all clinical situations. eGFR's persistently <60 mL/min signify possible Chronic Kidney Disease.    Anion gap 9 5 - 15    Comment: Performed at [facility] [address], [place], [place] [address]  CBC     Status: Abnormal   Collection Time: 07/27/17  5:13 AM  Result Value Ref Range   WBC 4.7 4.0 - 10.5 K/uL   RBC 3.78 (L) 3.87 - 5.11 MIL/uL   Hemoglobin 10.6 (L) 12.0 - 15.0 g/dL   HCT 33.9 (L) 36.0 - 46.0 %   MCV 89.7 78.0 - 100.0 fL   MCH 28.0 26.0 - 34.0 pg   MCHC 31.3 30.0 - 36.0 g/dL   RDW 12.9 11.5 - 15.5 %   Platelets 229 150 - 400 K/uL    Comment: Performed at [facility], [place] [address], [place], [place]  [place]     Status: None   Collection Time: 07/27/17  5:13 AM  Result Value Ref Range   Prothrombin Time 13.3 11.4 - 15.2 seconds   INR 1.02     Comment: Performed at [facility] [address] [place], [place] [address]  Glucose, capillary     Status: Abnormal   Collection Time: 07/27/17  6:45 AM  Result Value Ref Range   Glucose-Capillary 112 (H) 65 - 99 mg/dL  Glucose, capillary     Status: Abnormal   Collection Time: 07/27/17 11:46 AM  Result Value Ref Range   Glucose-Capillary 177 (H) 65 - 99 mg/dL  Glucose, capillary     Status: Abnormal   Collection Time: 07/27/17  5:11 PM  Result Value Ref Range   Glucose-Capillary 131 (H) 65 - 99 mg/dL  Glucose, capillary     Status: Abnormal   Collection Time: 07/27/17  9:32 PM  Result Value Ref Range   Glucose-Capillary 159 (H) 65 - 99 mg/dL  Glucose, capillary     Status: Abnormal   Collection Time: 07/28/17  6:15 AM  Result Value Ref Range   Glucose-Capillary 121 (H) 65 - 99 mg/dL     Constitutional: No distress . Vital signs reviewed. HEENT:  EOMI, oral membranes moist Neck: supple Cardiovascular: RRR without murmur. No JVD    Respiratory: CTA Bilaterally without wheezes or rales. Normal effort    GI: BS +, non-tender, non-distended  Musc: No edema or tenderness in extremities. Skin:   Intact. Warm and dry.    Neuro:  Alert Right facial droop persistent Alert/ oriented to person not time Motor: 4/5 RUE proximal to distal- stable 4+/5 LUE proximal to distal- stable RLE: 3-/5 HF, KE, ADF, 4/5 (stable) LLE: 3-/5 HF, KE, ADF stable Dysarthria Psych: f flat    Assessment/Plan: 1. 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 Physiatrist is providing close team supervision and 24 hour management of active medical problems listed below. Physiatrist and rehab team continue to assess barriers to discharge/monitor patient progress toward functional and medical goals. FIM: Function - Bathing Bathing activity did not occur: Refused Position: Shower Body parts bathed by patient: Chest, Abdomen, Front perineal area, Right upper leg, Left upper leg, Right arm, Left arm Body parts bathed by helper: Right lower leg, Left lower leg, Back, Buttocks  Function- Upper Body Dressing/Undressing What is the patient wearing?: Pull over shirt/dress Pull over shirt/dress - Perfomed by patient: Thread/unthread left sleeve, Put head through opening, Pull shirt over trunk, Thread/unthread right sleeve Pull over shirt/dress - Perfomed by helper: Thread/unthread right sleeve Assist Level: Supervision or verbal cues Function - Lower Body Dressing/Undressing What is the patient wearing?: Pants, Maryln Manuel, Shoes Position: Education officer, museum at [employer] - Performed by helper: Thread/unthread right underwear leg,  Thread/unthread left underwear leg, Pull underwear up/down Pants- Performed by patient: Thread/unthread right pants leg, Thread/unthread left pants leg Pants- Performed by helper: Pull pants up/down Non-skid slipper socks- Performed by helper: Don/doff right sock, Don/doff left sock Socks - Performed by helper: Don/doff right sock, Don/doff left sock Shoes - Performed by patient: Don/doff right shoe, Fasten right Shoes - Performed by helper: Don/doff left shoe, Fasten left, Don/doff right shoe, Fasten right TED Hose - Performed by helper: Don/doff right TED hose, Don/doff left TED hose  Assist for footwear: Maximal assist Assist for lower body dressing: 2 Helpers  Function - Toileting Toileting steps completed by patient: Performs perineal hygiene, Adjust clothing prior to toileting Toileting steps completed by helper: Adjust clothing prior to toileting, Adjust clothing after toileting Toileting Assistive Devices: Grab bar or rail, Other (comment)(RW)  Function - Toilet Transfers Toilet transfer assistive device: Elevated toilet seat/BSC over toilet, Walker Assist level to toilet: Touching or steadying assistance (Pt > 75%) Assist level from toilet: Touching or steadying assistance (Pt > 75%) Assist level to bedside commode (at bedside): Moderate assist (Pt 50 - 74%/lift or lower)(per Leann Nickles-Wright, NT) Assist level from bedside commode (at bedside): Moderate assist (Pt 50 - 74%/lift or lower)  Function - Chair/bed transfer Chair/bed transfer method: Ambulatory Chair/bed transfer assist level: Touching or steadying assistance (Pt > 75%) Chair/bed transfer assistive device: Armrests Chair/bed transfer details: Verbal cues for sequencing, Verbal cues for precautions/safety  Function - Locomotion: Wheelchair Will patient use wheelchair at discharge?: No Type: Manual Wheelchair activity did not occur: Safety/medical concerns Max wheelchair distance: 50 Assist Level: Touching or  steadying assistance (Pt > 75%) Wheel 50 feet with 2 turns activity did not occur: Safety/medical concerns Assist Level: Touching or steadying assistance (Pt > 75%) Wheel 150 feet activity did not occur: Safety/medical concerns Function - Locomotion: Ambulation Assistive device: Walker-rolling Max distance: 10 Assist level: Touching or steadying assistance (Pt > 75%) Assist level: Touching or steadying assistance (Pt > 75%) Assist level: Touching or steadying assistance (Pt > 75%) Walk 150 feet activity did not occur: Safety/medical concerns Assist level: Moderate assist (Pt 50 - 74%) Assist level: Moderate assist (Pt 50 - 74%)  Function - Comprehension Comprehension: Auditory Comprehension assist level: Follows basic conversation/direction with extra time/assistive device  Function - Expression Expression: Verbal Expression assist level: Expresses basic 50 - 74% of the time/requires cueing 25 - 49% of the time. Needs to repeat parts of sentences., Expresses basic 25 - 49% of the time/requires cueing 50 - 75% of the time. Uses single words/gestures., Expresses basic 75 - 89% of the time/requires cueing 10 - 24% of the time. Needs helper to occlude trach/needs to repeat words.  Function - Social Interaction Social Interaction assist level: Interacts appropriately 75 - 89% of the time - Needs redirection for appropriate language or to initiate interaction.  Function - Problem Solving Problem solving assist level: Solves basic 75 - 89% of the time/requires cueing 10 - 24% of the time  Function - Memory Memory assist level: Recognizes or recalls 50 - 74% of the time/requires cueing 25 - 49% of the time Patient normally able to recall (first 3 days only): Current season, That he or she is in a hospital, Location of own room   Medical Problem List and Plan:  1. Right hemiparesis and functional deficits secondary to left periventricular white matter and right frontal lobe infarcts    Cont  CIR -  PT, OT, SLP Team conference today please see physician documentation under team conference tab, met with team face-to-face to discuss problems,progress, and goals. Formulized individual treatment plan based on medical history, underlying problem and comorbidities.  2. DVT Prophylaxis/Anticoagulation: Pharmaceutical: Lovenox 46m q 24h 3. Chronic HA/Pain Management: Depakote bid with tylenol prn.  Lower extremity paresthesias  Trial of Cymbalta 4. Mood: LCSW to follow for evaluation and support.    Emotional lability trial of Cymbalta.    If this is not helpful may consider SSRI 5. Neuropsych: This patient is not fully capable of  making decisions on her own behalf. Appreciate input   6. Skin/Wound Care: routine pressure relief measures.  7. Fluids/Electrolytes/Nutrition: Monitor I/Os  8. 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 Monitor BS ac/hs. Educate on importance of compliance.  CBG (last 3)  Recent Labs    07/27/17 1711 07/27/17 2132 07/28/17 0615  GLUCAP 131* 159* 121*    controlled on 5/15 9. Dyslipidemia: On Crestor.  10. Anemia: Hgb stable at 9.5 on 5/1, improved to 10.6 on 5/14 11. HTN with orthostasis- will check q shift, no syncope x 4 d   Monitor BP bid.   -no orthostasis today  -bp's have been trending too high  -lisinopril is 78m bid, hold this am and will likely need to reduce dose , need to measure BPs in sitting position not while in bed.  Ask for card input  -added low dose metoprolol 12.532m5/11 with some improvement Vitals:   07/27/17 2130 07/28/17 0409  BP: (!) 185/64 (!) 152/66  Pulse: 75 76  Resp: 18 16  Temp: 97.6 F (36.4 C) 97.7 F (36.5 C)  SpO2: 100% 97%   Ordered TEDs  Abdominal binder    Continue to monitor- IVF per Cardiology, should be able to d/c once PEG is place and free H20 can be administered 12. Hypoalbuminemia  Supplement initiated on 5/4 13.   AMS probable seizure,CT neg forhemmorhagic transformation, EEG neg for seizure , slowing noted,  On Depakene, reviewed Neuro outpt note, possible seizure in 2017 , has also been treated for migraines per outpt Neuro Dr JaTomi Likensadded Keppra to Valproate depakene however the "seizure" may have been near syncope from orthostatic event 14.  Mild abd tenderness improved KUB neg 15.  Dysphagia moderately severe- supplement fluids with IV at noc  Decision per pt and daughter to proceed wit PEG- still has long term goal of po feeds  -have held plavix (last dose received 5/11) in expectation of placement this week 5/16 or 5/17        LOS (Days) 15Benedict Jarah Pember 07/28/2017, 8:30 AM

## 2017-07-28 NOTE — Progress Notes (Signed)
Physical Therapy Session Note  Patient Details  Name: Kelly Romero MRN: [id number] Date of Birth: 1965-03-22  Today's Date: 07/28/2017 PT Individual Time: 1105-1200   73mn   Short Term Goals: Week 1:  PT Short Term Goal 1 (Week 1): Pt will ambulate 75 ft with LRAD & supervision. PT Short Term Goal 1 - Progress (Week 1): Not met PT Short Term Goal 2 (Week 1): Pt will negotiate 12 steps with B rails & mod assist for strengthening purposes. PT Short Term Goal 2 - Progress (Week 1): Not met Week 2:  PT Short Term Goal 1 (Week 2): Pt will ambulate 50 ft with LRAD & supervision. PT Short Term Goal 1 - Progress (Week 2): Not met PT Short Term Goal 2 (Week 2): Pt will negotiate 12 steps with B rails and mod assist for strengthening purposes. PT Short Term Goal 2 - Progress (Week 2): Not met Week 3:  PT Short Term Goal 1 (Week 3): STG = LTG due to estimated d/c date.  Skilled Therapeutic Interventions/Progress Updates:   Pt received supine in bed and agreeable to PT. Supine>sit transfer with min  assist and mod cues. PT applied abdominal binder in sitting, not noted to have thigh high teds in place.   Sit<>stand, pt remains alert and reports no signs of orthostasis. [facility] from [facility].    [facility] in sitting 183/73.   Stand pivot transfer to nustep, no report of orthostasis from Pt during transfer. nustep training for endurance and reciprocal movement, and postural control. Moderate cues to maintain neutral sitting posture and prevent R lateral lean. Pt reports feeling like she is falling L when sitting in neutral upright position. BP assessed aflter 3 min and 6 min on nustep, level 3.   Stand pivot transfer back to WC. After 3 minutes of rest PT noted to have significnatly elevated BP and returned to Room. Stand pivot transfer to bed with min assist and sit>supine with min-mod assist and cues for sequencing. RN aware of  elevated BP. See below and flow sheet for details   PT assessedBP throughout treatment:   Sitting: following bed mobility. 183/75.  Sitting prior to Nustep. 183/82.  After 3 min on nustep: 182/78.  6 min on nustep 183/79.  Standing following nustep: 84/58. (mildly symptomatic) Sitting at 0 min: 158/85. ( continues to be milly symptomatic)  sitting at 3 min 205/83.  Supine in bed  At end of treatment with binder removed: 200/84 HR remained 83-92 throughout session.      Therapy Documentation Precautions:  Precautions Precautions: Fall Restrictions Weight Bearing Restrictions: No Vital Signs: Therapy Vitals Temp: 97.7 F (36.5 C) Temp Source: Oral Pulse Rate: 76 Resp: 16 BP: (!) 152/66 Patient Position (if appropriate): Lying Oxygen Therapy SpO2: 97 % O2 Device: Room Air Pain:   0/10  See Function Navigator for Current Functional Status.   Therapy/Group: Individual Therapy  ALorie Phenix5/15/2019, 8:03 AM

## 2017-07-28 NOTE — Plan of Care (Signed)
  Problem: Consults Goal: RH STROKE PATIENT EDUCATION Description See Patient Education module for education specifics  Outcome: Progressing   Problem: RH BOWEL ELIMINATION Goal: RH STG MANAGE BOWEL WITH ASSISTANCE Description STG Manage Bowel with mod Assistance.  Outcome: Progressing   Problem: RH BLADDER ELIMINATION Goal: RH STG MANAGE BLADDER WITH ASSISTANCE Description STG Manage Bladder With min. Assistance  Outcome: Progressing   Problem: RH SKIN INTEGRITY Goal: RH STG SKIN FREE OF INFECTION/BREAKDOWN Description With min. assist.  Outcome: Progressing Goal: RH STG MAINTAIN SKIN INTEGRITY WITH ASSISTANCE Description STG Maintain Skin Integrity With min. Assistance.  Outcome: Progressing   Problem: RH SAFETY Goal: RH STG ADHERE TO SAFETY PRECAUTIONS W/ASSISTANCE/DEVICE Description STG Adhere to Safety Precautions With mod.Assistance/Device.  Outcome: Progressing   Problem: RH PAIN MANAGEMENT Goal: RH STG PAIN MANAGED AT OR BELOW PT'S PAIN GOAL Description Less than 3.  Outcome: Progressing   Problem: RH KNOWLEDGE DEFICIT Goal: RH STG INCREASE KNOWLEDGE OF DIABETES Outcome: Progressing Goal: RH STG INCREASE KNOWLEDGE OF HYPERTENSION Outcome: Progressing   Problem: RH Other (Specify) Goal: RH LTG Other (Specify)1 Outcome: Progressing

## 2017-07-28 NOTE — Patient Care Conference (Signed)
Inpatient RehabilitationTeam Conference and Plan of Care Update Date: 07/28/2017   Time: 10:30 AM    Patient Name: Kelly Romero      Medical Record Number: [id number]  Date of Birth: 1965/05/02 Sex: Female         Room/Bed: 4W25C/4W25C-01 Payor Info: Payor: MEDICAID [place] / Plan: MEDICAID [place] ACCESS / Product Type: *No Product type* /    Admitting Diagnosis: CVA  Admit Date/Time:  07/13/2017  6:11 PM Admission Comments: No comment available   Primary Diagnosis:  <principal problem not specified> Principal Problem: <principal problem not specified>  Patient Active Problem List   Diagnosis Date Noted  . Hypoalbuminemia due to protein-calorie malnutrition (HCC)   . Orthostatic hypotension   . Poorly controlled type 2 diabetes mellitus with peripheral neuropathy (HCC)   . Emotional lability   . Thrombotic stroke (HCC) 07/13/2017  . Diabetes mellitus type 2 in obese (HCC)   . History of CVA (cerebrovascular accident)   . Benign essential HTN   . Dysphagia, post-stroke   . Acute blood loss anemia   . Morbid obesity (HCC)   . Right hemiparesis (HCC)   . Cryptogenic stroke (HCC) 07/08/2017  . Chest pain 11/06/2016  . 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 Aphasia as late effect of stroke 04/10/2016  . Encephalopathy   . Cerebral thrombosis with cerebral infarction 12/29/2015  . Hyperglycemia   . Hyperlipidemia   . Hypertensive emergency   . Deep venous thrombosis (HCC)   . AKI (acute kidney injury) (HCC) 12/26/2015  . Altered mental status   . Stroke-like symptoms   . Acute encephalopathy 08/17/2015  . Anxiety and depression   . CVA (cerebrovascular accident due to intracerebral hemorrhage) (HCC) 08/15/2015  . Stroke (HCC)   . Encephalopathy, metabolic 12/28/2014  . 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 Essential hypertension 12/28/2014  . New onset headache 11/02/2014    Expected  Discharge Date: Expected Discharge Date: 08/03/17  Team Members Present: Physician leading conference: Dr. Claudette Laws Social Worker Present: Dossie Der, LCSW Nurse Present: Other (comment)(Mekides Nida-RN) PT Present: Judieth Keens, PT OT Present: Johnsie Cancel, OT SLP Present: Reuel Derby, SLP PPS Coordinator present : Tora Duck, RN, CRRN     Current Status/Progress Goal Weekly Team Focus  Medical   Still has symptomatic orthostatic hypotension.  Seizure episode may have been related to blood pressure rather than true seizure  Reduce syncope fall risk  Placing PEG due to inadequate nutrition   Bowel/Bladder   incontinent episodes of B/B  Continence of bowel and bladder  timed toileting   Swallow/Nutrition/ Hydration   Dysphagia 1 with honey thick liquids  Max A - downgraded 07/28/17  downgraded to honey thick liquids, silent aspiration of thin liquids   ADL's   Min A UB bathing/dressing; mod- max A LB bathing/dressing; max A toileting  Downgraded due to mod A overall due to decline in participation  ADL re-training, functional mobility, family ed, d/c planning   Mobility   supervision<>mod assist bed mobility, min assist transfers, min assist short distance ambulation, very slow progress  supervision<>min assist overall  transfers, gait, activity tolerance, stair negotiation as able, strengthening, balance, NMR, pt/family education   Communication   Max A word level intelligibility and Mod A to Min A expressive aphasia  Max A - downgraded 07/28/17  speech intelligibility strategies at word level   Safety/Cognition/ Behavioral Observations  Pain   Denies  Pain < 2  Assess Qshift and PRN   Skin   MASD to groin  prevent infection/breakdwon  assess qshift and prn      *See Care Plan and progress notes for long and short-term goals.     Barriers to Discharge  Current Status/Progress Possible Resolutions Date Resolved   Physician    Medical stability;Pending  surgery;Incontinence;Other (comments)  Severe orthostatic hypotension  Cardiology re-eval  Adjusting BP meds, place PEG, family training for PEG      Nursing                  PT  Behavior;Incontinence  impaired cognition              OT                  SLP                SW                Discharge Planning/Teaching Needs:  Family education this week, pt getting PEG this week also. Family aware will need 24 hr physical care at discharge from Rehab      Team Discussion:  Progressing slowly and goals have been downgraded due to medical issues. Keppra discharged and now wearing abdominal binder and teds hose for BP issues. PEG to be placed on Thursday. Continuous IV fluids for now. Family education this week-Friday with daughter and son in-law. Aware pt will require 24 hr physical care.  Revisions to Treatment Plan:  DC 5/21    Continued Need for Acute Rehabilitation Level of Care: The patient requires daily medical management by a physician with specialized training in physical medicine and rehabilitation for the following conditions: Daily direction of a multidisciplinary physical rehabilitation program to ensure safe treatment while eliciting the highest outcome that is of practical value to the patient.: Yes Daily analysis of laboratory values and/or radiology reports with any subsequent need for medication adjustment of medical intervention for : Blood pressure problems;Neurological problems;Mood/behavior problems  Kelly Romero 07/28/2017, 1:02 PM

## 2017-07-29 ENCOUNTER — Inpatient Hospital Stay (HOSPITAL_COMMUNITY): Payer: Medicaid Other | Admitting: Speech Pathology

## 2017-07-29 ENCOUNTER — Inpatient Hospital Stay (HOSPITAL_COMMUNITY): Payer: Medicaid Other | Admitting: Occupational Therapy

## 2017-07-29 ENCOUNTER — Inpatient Hospital Stay (HOSPITAL_COMMUNITY): Payer: Medicaid Other | Admitting: Physical Therapy

## 2017-07-29 ENCOUNTER — Ambulatory Visit: Payer: Medicaid Other

## 2017-07-29 LAB — GLUCOSE, CAPILLARY
GLUCOSE-CAPILLARY: 122 mg/dL — AB (ref 65–99)
GLUCOSE-CAPILLARY: 142 mg/dL — AB (ref 65–99)
GLUCOSE-CAPILLARY: 131 mg/dL — AB (ref 65–99)
Glucose-Capillary: 124 mg/dL — ABNORMAL HIGH (ref 65–99)

## 2017-07-29 NOTE — Progress Notes (Addendum)
Progress Note  Patient Name: Kelly Romero Date of Encounter: 07/29/2017  Primary Cardiologist: Nanetta Batty, MD   Subjective   No chest pain or SOB, + dizzy when stood this AM   Inpatient Medications    Scheduled Meds: . aspirin EC  81 mg Oral Daily  . DULoxetine  30 mg Oral Daily  . enoxaparin (LOVENOX) injection  40 mg Subcutaneous Q24H  . feeding supplement (PRO-STAT SUGAR FREE 64)  30 mL Oral BID  . insulin aspart  0-15 Units Subcutaneous TID AC & HS  . insulin detemir  20 Units Subcutaneous Daily  . lisinopril  10 mg Oral BID  . metoprolol tartrate  12.5 mg Oral BID  . rosuvastatin  40 mg Oral QHS  . simethicone  80 mg Oral QID  . Valproate Sodium  500 mg Oral BID   Continuous Infusions: . sodium chloride 0.9 % 1,000 mL with potassium chloride 20 mEq infusion Stopped (07/29/17 0601)   PRN Meds: acetaminophen, alum & mag hydroxide-simeth, bisacodyl, diphenhydrAMINE, guaiFENesin-dextromethorphan, polyethylene glycol, prochlorperazine **OR** prochlorperazine **OR** prochlorperazine, sodium phosphate   Vital Signs    Vitals:   07/28/17 1200 07/28/17 1511 07/28/17 2124 07/29/17 0421  BP: (!) 203/76 121/68 (!) 173/70 (!) 187/69  Pulse:  86 84 80  Resp:  Temp:  98.5 F (36.9 C) 99.1 F (37.3 C) 98.8 F (37.1 C)  TempSrc:  Oral Oral Oral  SpO2:  100% 100% 98%  Weight:      Height:        Intake/Output Summary (Last 24 hours) at 07/29/2017 0857 Last data filed at 07/28/2017 1700 Gross per 24 hour  Intake 60 ml  Output -  Net 60 ml   Filed Weights   07/19/17 0134 07/20/17 1804 07/28/17 0409  Weight: 211 lb 13.8 oz (96.1 kg) 210 lb 5.1 oz (95.4 kg) 210 lb 1.6 oz (95.3 kg)    Telemetry    none - Personally Reviewed  ECG    No new - Personally Reviewed  Physical Exam   GEN: No acute distress.   Neck: No JVD Cardiac: RRR, no murmurs, rubs, or gallops.  Respiratory: Clear to auscultation bilaterally. GI: Soft, nontender, non-distended   MS: No edema; No deformity.  TED stockings in place  Neuro:  Nonfocal  Psych: Normal affect   Labs    Chemistry Recent Labs  Lab 07/27/17 0513  NA 143  K 3.8  CL 105  CO2 29  GLUCOSE 124*  BUN 9  CREATININE 1.03*  CALCIUM 9.4  GFRNONAA >60  GFRAA >60  ANIONGAP 9     Hematology Recent Labs  Lab 07/27/17 0513  WBC 4.7  RBC 3.78*  HGB 10.6*  HCT 33.9*  MCV 89.7  MCH 28.0  MCHC 31.3  RDW 12.9  PLT 229    Cardiac EnzymesNo results for input(s): TROPONINI in the last 168 hours. No results for input(s): TROPIPOC in the last 168 hours.   BNPNo results for input(s): BNP, PROBNP in the last 168 hours.   DDimer No results for input(s): DDIMER in the last 168 hours.   Radiology    No results found.  Cardiac Studies   Echo 07/09/17 Study Conclusions  - Left ventricle: The cavity size was normal. Systolic function was normal. The estimated ejection fraction was in the range of 60% to 65%. Wall motion was normal; there were no regional wall motion abnormalities. Left ventricular diastolic function parameters were normal. - Mitral valve: There  was mild regurgitation. - Right ventricle: Systolic function was normal. - Pulmonary arteries: Systolic pressure was within the normal range. - Pericardium, extracardiac: A trivial pericardial effusion was identified.  Impressions:  - Incomplete study as patient started having panic attack and requested rest of echo be stopped.    Patient Profile     52 y.o. female with hx of recent CVA, vascular disease, diabetes, HTN and HLD called again for recurrent orthostatic hypotension.   Admitted end of April with acute stroke and transferred to inpatient rehab. ILR has been placed. Cardiology has seen the patient 5/7 for syncope. Felt positional due to orthostasis.  Symptoms improved after IV hydration. We signed off 07/22/17. Called again today with orthostatis and dizziness.    Assessment & Plan      Orthostatic hypotension   --yesterday 140 systolic to 80 systolic --wears abd binder but not TED--they have been placed today.  --today 187/69 lying to 103/70 standing P up from 80 to 108 --for PEG today --? Florinef or midodrine until intake improves   For questions or updates, please contact [facility] Please consult [email or web address] for contact info under Cardiology/STEMI.      Signed, Nada Boozer, NP  07/29/2017, 8:57 AM    Patient examined chart reviewed. Still with orthostasis from stroke , DM and deconditioning should be ok for PEG today Consider adding midodrine if systolic BP's not in 200 mmHg range Exam with overweight female stroke with right sided weakness worse than left TED hose facial droop halting speech clear lungs and no cardiac murmur  Charlton Haws

## 2017-07-29 NOTE — Progress Notes (Signed)
Speech Language Pathology Daily Session Note  Patient Details  Name: Kelly Romero MRN: [id number] Date of Birth: 12-Mar-1966  Today's Date: 07/29/2017 SLP Individual Time: 0904-1000 SLP Individual Time Calculation (min): 56 min  Short Term Goals: Week 3: SLP Short Term Goal 1 (Week 3): Pt will utilize word finding strategies to convey basic thoughts and ideas with supervision A cues.  SLP Short Term Goal 2 (Week 3): Pt will utilize speech intelligibility strategies with Mod A  to increase speech intelligibility to ~ 90% at the phrase level.  SLP Short Term Goal 3 (Week 3): Pt will solve semi-complex problem solving tasks with Mod A cues.  SLP Short Term Goal 4 (Week 3): Pt will demonstrate selective attention in moderately distracting environment for 30 minutes with Min A cues.  SLP Short Term Goal 5 (Week 3): Pt will consume puree with honey thick liquids and minimal s/s of aspiration and Min A for useof compensatory swallow strategies.   Skilled Therapeutic Interventions:  Pt was seen for skilled ST targeting communication goals.  POs were held on this date as pt was NPO for PEG placement; however, SLP did provide oral care given that pt had thick, white secretions that built up at the corners of her mouth during speech tasks.  Pt needed min assist verbal cues for use of word finding strategies when describing a targeted word during a verbal description task but only needed supervision cues for responsive naming in response to therapist's prompts.  SLP reviewed and reinforced compensatory strategy use with pt's daughter and son in law who were present for the duration of today's therapy session, specifically emphasizing allowing pt more time to communicated and providing pt with cues for word finding rather than giving her the words.  Formal family education is scheduled for tomorrow which both daughter and son in law report they plan to attend.   Pt left in wheelchair with call bell within  reach and quick release belt donned.  Continue per current plan of care.   Function:  Eating Eating                 Cognition Comprehension Comprehension assist level: Follows basic conversation/direction with extra time/assistive device  Expression   Expression assist level: Expresses basic 75 - 89% of the time/requires cueing 10 - 24% of the time. Needs helper to occlude trach/needs to repeat words.  Social Interaction Social Interaction assist level: Interacts appropriately 90% of the time - Needs monitoring or encouragement for participation or interaction.  Problem Solving Problem solving assist level: Solves basic 75 - 89% of the time/requires cueing 10 - 24% of the time  Memory Memory assist level: Recognizes or recalls 50 - 74% of the time/requires cueing 25 - 49% of the time    Pain Pain Assessment Pain Scale: 0-10 Pain Score: 0-No pain  Therapy/Group: Individual Therapy  Tylena Prisk, Melanee Spry 07/29/2017, 10:05 AM

## 2017-07-29 NOTE — Progress Notes (Signed)
Subjective/Complaints:  Appreciate cardiology notes, pt orthostatic once again this am with pre syncope symptoms that resolved with Sitting  ROS: Limited by severe dysarthria and cognition   Objective: Vital Signs: Blood pressure (!) 187/69, pulse 80, temperature 98.8 F (37.1 C), temperature source Oral, resp. rate 16, height '5\' 5"'$  (1.651 m), weight 95.3 kg (210 lb 1.6 oz), SpO2 98 %. No results found. Results for orders placed or performed during the hospital encounter of 07/13/17 (from the past 72 hour(s))  Glucose, capillary     Status: Abnormal   Collection Time: 07/26/17 12:29 PM  Result Value Ref Range   Glucose-Capillary 188 (H) 65 - 99 mg/dL  Glucose, capillary     Status: Abnormal   Collection Time: 07/26/17  4:39 PM  Result Value Ref Range   Glucose-Capillary 139 (H) 65 - 99 mg/dL  Glucose, capillary     Status: Abnormal   Collection Time: 07/26/17  8:58 PM  Result Value Ref Range   Glucose-Capillary 106 (H) 65 - 99 mg/dL  Basic metabolic panel     Status: Abnormal   Collection Time: 07/27/17  5:13 AM  Result Value Ref Range   Sodium 143 135 - 145 mmol/L   Potassium 3.8 3.5 - 5.1 mmol/L   Chloride 105 101 - 111 mmol/L   CO2 29 22 - 32 mmol/L   Glucose, Bld 124 (H) 65 - 99 mg/dL   BUN 9 6 - 20 mg/dL   Creatinine, Ser 1.03 (H) 0.44 - 1.00 mg/dL   Calcium 9.4 8.9 - 10.3 mg/dL   GFR calc non Af Amer >60 >60 mL/min   GFR calc Af Amer >60 >60 mL/min    Comment: (NOTE) The eGFR has been calculated using the CKD EPI equation. This calculation has not been validated in all clinical situations. eGFR's persistently <60 mL/min signify possible Chronic Kidney Disease.    Anion gap 9 5 - 15    Comment: Performed at [facility] [address], [place], [place] [address]  CBC     Status: Abnormal   Collection Time: 07/27/17  5:13 AM  Result Value Ref Range   WBC 4.7 4.0 - 10.5 K/uL   RBC 3.78 (L) 3.87 - 5.11 MIL/uL   Hemoglobin 10.6 (L) 12.0 - 15.0 g/dL   HCT  33.9 (L) 36.0 - 46.0 %   MCV 89.7 78.0 - 100.0 fL   MCH 28.0 26.0 - 34.0 pg   MCHC 31.3 30.0 - 36.0 g/dL   RDW 12.9 11.5 - 15.5 %   Platelets 229 150 - 400 K/uL    Comment: Performed at [facility], [place] [address] [place], [place] [address]  Protime-INR     Status: None   Collection Time: 07/27/17  5:13 AM  Result Value Ref Range   Prothrombin Time 13.3 11.4 - 15.2 seconds   INR 1.02     Comment: Performed at [facility] [address] [place], [place] [address]  Glucose, capillary     Status: Abnormal   Collection Time: 07/27/17  6:45 AM  Result Value Ref Range   Glucose-Capillary 112 (H) 65 - 99 mg/dL  Glucose, capillary     Status: Abnormal   Collection Time: 07/27/17 11:46 AM  Result Value Ref Range   Glucose-Capillary 177 (H) 65 - 99 mg/dL  Glucose, capillary     Status: Abnormal   Collection Time: 07/27/17  5:11 PM  Result Value Ref Range   Glucose-Capillary 131 (H)  65 - 99 mg/dL  Glucose, capillary     Status: Abnormal   Collection Time: 07/27/17  9:32 PM  Result Value Ref Range   Glucose-Capillary 159 (H) 65 - 99 mg/dL  Glucose, capillary     Status: Abnormal   Collection Time: 07/28/17  6:15 AM  Result Value Ref Range   Glucose-Capillary 121 (H) 65 - 99 mg/dL  Glucose, capillary     Status: Abnormal   Collection Time: 07/28/17 11:56 AM  Result Value Ref Range   Glucose-Capillary 185 (H) 65 - 99 mg/dL  Glucose, capillary     Status: Abnormal   Collection Time: 07/28/17  4:46 PM  Result Value Ref Range   Glucose-Capillary 203 (H) 65 - 99 mg/dL  Glucose, capillary     Status: Abnormal   Collection Time: 07/28/17  9:14 PM  Result Value Ref Range   Glucose-Capillary 119 (H) 65 - 99 mg/dL  Glucose, capillary     Status: Abnormal   Collection Time: 07/29/17  6:27 AM  Result Value Ref Range   Glucose-Capillary 122 (H) 65 - 99 mg/dL     Constitutional: No distress . Vital signs reviewed. HEENT: EOMI, oral membranes moist Neck:  supple Cardiovascular: RRR without murmur. No JVD    Respiratory: CTA Bilaterally without wheezes or rales. Normal effort    GI: BS +, non-tender, non-distended  Musc: No edema or tenderness in extremities. Skin:   Intact. Warm and dry.    Neuro:  Alert Right facial droop persistent Alert/ oriented to person not time Motor: 4/5 RUE proximal to distal- stable 4+/5 LUE proximal to distal- stable RLE: 3-/5 HF, KE, ADF, 4/5 (stable) LLE: 3-/5 HF, KE, ADF stable Dysarthria Psych: f flat    Assessment/Plan: 1. 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 Physiatrist is providing close team supervision and 24 hour management of active medical problems listed below. Physiatrist and rehab team continue to assess barriers to discharge/monitor patient progress toward functional and medical goals. FIM: Function - Bathing Bathing activity did not occur: Refused Position: Shower Body parts bathed by patient: Chest, Abdomen, Front perineal area, Right upper leg, Left upper leg, Right arm, Left arm Body parts bathed by helper: Right lower leg, Left lower leg, Back, Buttocks  Function- Upper Body Dressing/Undressing What is the patient wearing?: Pull over shirt/dress Pull over shirt/dress - Perfomed by patient: Thread/unthread left sleeve, Put head through opening, Pull shirt over trunk, Thread/unthread right sleeve Pull over shirt/dress - Perfomed by helper: Thread/unthread right sleeve Assist Level: Supervision or verbal cues Function - Lower Body Dressing/Undressing What is the patient wearing?: Pants, Maryln Manuel, Shoes Position: Education officer, museum at [employer] - Performed by helper: Thread/unthread right underwear leg, Thread/unthread left underwear leg, Pull  underwear up/down Pants- Performed by patient: Thread/unthread right pants leg, Thread/unthread left pants leg Pants- Performed by helper: Pull pants up/down Non-skid slipper socks- Performed by helper: Don/doff right sock, Don/doff left sock Socks - Performed by helper: Don/doff right sock, Don/doff left sock Shoes - Performed by patient: Don/doff right shoe, Fasten right Shoes - Performed by helper: Don/doff left shoe, Fasten left, Don/doff right shoe, Fasten right TED Hose - Performed by helper: Don/doff right TED hose, Don/doff left TED hose Assist for footwear: Maximal assist Assist for lower body dressing: 2 Helpers  Function - Toileting Toileting steps  completed by patient: Performs perineal hygiene, Adjust clothing prior to toileting Toileting steps completed by helper: Adjust clothing prior to toileting, Adjust clothing after toileting Toileting Assistive Devices: Grab bar or rail, Other (comment)(rolling walker and BSC) Assist level: Two helpers  Function - Air cabin crew transfer assistive device: Elevated toilet seat/BSC over toilet, Walker Assist level to toilet: Touching or steadying assistance (Pt > 75%) Assist level from toilet: Touching or steadying assistance (Pt > 75%) Assist level to bedside commode (at bedside): Moderate assist (Pt 50 - 74%/lift or lower)(per Leann Nickles-Wright, NT) Assist level from bedside commode (at bedside): Moderate assist (Pt 50 - 74%/lift or lower)  Function - Chair/bed transfer Chair/bed transfer method: Ambulatory Chair/bed transfer assist level: Touching or steadying assistance (Pt > 75%) Chair/bed transfer assistive device: Armrests Chair/bed transfer details: Verbal cues for sequencing, Verbal cues for precautions/safety  Function - Locomotion: Wheelchair Will patient use wheelchair at discharge?: No Type: Manual Wheelchair activity did not occur: Safety/medical concerns Max wheelchair distance: 50 Assist Level: Touching  or steadying assistance (Pt > 75%) Wheel 50 feet with 2 turns activity did not occur: Safety/medical concerns Assist Level: Touching or steadying assistance (Pt > 75%) Wheel 150 feet activity did not occur: Safety/medical concerns Function - Locomotion: Ambulation Assistive device: Walker-rolling Max distance: 10 Assist level: Touching or steadying assistance (Pt > 75%) Assist level: Touching or steadying assistance (Pt > 75%) Assist level: Touching or steadying assistance (Pt > 75%) Walk 150 feet activity did not occur: Safety/medical concerns Assist level: Moderate assist (Pt 50 - 74%) Assist level: Moderate assist (Pt 50 - 74%)  Function - Comprehension Comprehension: Auditory Comprehension assist level: Follows basic conversation/direction with extra time/assistive device  Function - Expression Expression: Verbal Expression assist level: Expresses basic 50 - 74% of the time/requires cueing 25 - 49% of the time. Needs to repeat parts of sentences., Expresses basic 25 - 49% of the time/requires cueing 50 - 75% of the time. Uses single words/gestures.  Function - Social Interaction Social Interaction assist level: Interacts appropriately 75 - 89% of the time - Needs redirection for appropriate language or to initiate interaction.  Function - Problem Solving Problem solving assist level: Solves basic 75 - 89% of the time/requires cueing 10 - 24% of the time  Function - Memory Memory assist level: Recognizes or recalls 50 - 74% of the time/requires cueing 25 - 49% of the time Patient normally able to recall (first 3 days only): Current season, That he or she is in a hospital, Location of own room   Medical Problem List and Plan:  1. Right hemiparesis and functional deficits secondary to left periventricular white matter and right frontal lobe infarcts    Cont CIR -  PT, OT, SLP standing tolerance is poor, ambulation goals may not be practical , limit standing to transfers, dressing  in bed to avoid hypotension  2. DVT Prophylaxis/Anticoagulation: Pharmaceutical: Lovenox '40mg'$  q 24h 3. Chronic HA/Pain Management: Depakote bid with tylenol prn.  Lower extremity paresthesias  Trial of Cymbalta 4. Mood: LCSW to follow for evaluation and support.    Emotional lability trial of Cymbalta.    If this is not helpful may consider SSRI 5. Neuropsych: This patient is not fully capable of making decisions on her own behalf. Appreciate input   6. Skin/Wound Care: routine pressure relief measures.  7. Fluids/Electrolytes/Nutrition: Monitor I/Os  8. 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 Monitor  BS ac/hs. Educate on importance of compliance.  CBG (last 3)  Recent Labs    07/28/17 1646 07/28/17 2114 07/29/17 0627  GLUCAP 203* 119* 122*    controlled on 5/15 9. Dyslipidemia: On Crestor.  10. Anemia: Hgb stable at 9.5 on 5/1, improved to 10.6 on 5/14 11. HTN with orthostasis- will check q shift, no syncope x 4 d   Monitor BP bid.   -no orthostasis today  -bp's have been trending too high  -lisinopril is '20mg'$  bid, hold this am and will likely need to reduce dose , need to measure BPs in sitting position not while in bed.  Appreciate card input  -added low dose metoprolol 12.'5mg'$  5/11 with some improvement Lying BPs need to be high to compensate for orthostatic drops Vitals:   07/28/17 2124 07/29/17 0421  BP: (!) 173/70 (!) 187/69  Pulse: 84 80  Resp: 18 16  Temp: 99.1 F (37.3 C) 98.8 F (37.1 C)  SpO2: 100% 98%   Ordered TEDs  Abdominal binder    Continue to monitor- IVF per Cardiology, should be able to d/c once PEG is place and free H20 can be administered  12. Hypoalbuminemia  Supplement initiated on 5/4 13.  AMS probable seizure,CT neg forhemmorhagic transformation, EEG neg for seizure , slowing noted,  On Depakene, reviewed Neuro outpt note, possible seizure in 2017 , has also  been treated for migraines per outpt Neuro Dr Tomi Likens, added Keppra to Valproate depakene however the "seizure" may have been near syncope from orthostatic event 14.  Mild abd tenderness improved KUB neg 15.  Dysphagia moderately severe- supplement fluids with IV at noc  Decision per pt and daughter to proceed wit PEG- to be placed today -have held plavix (last dose received 5/11) in expectation of placement this week 5/16 or 5/17        LOS (Days) [place] E Nikolis Berent 07/29/2017, 8:29 AM

## 2017-07-29 NOTE — Progress Notes (Addendum)
Social Work Patient ID: Kelly Romero, female   DOB: 1966-01-28, 52 y.o.   MRN: [id number] Met with daughter and son in-law who where here for family training and pt not having a good day, passed out twice in OT session. RN and PA aware. PEG postponed due to being given lovenox shot, try tomorrow. Therapy team pt may benefit from pt staying a few extra days, will ask MD. Work on discharge needs. MD feels can go with Wed and see how PEG does and may extend from there.

## 2017-07-29 NOTE — Progress Notes (Signed)
Physical Therapy Session Note  Patient Details  Name: Kelly Romero MRN: [id number] Date of Birth: 1965/12/29  Today's Date: 07/29/2017 PT Individual Time: 1305-1403 PT Individual Time Calculation (min): 58 min   Short Term Goals: Week 3:  PT Short Term Goal 1 (Week 3): STG = LTG due to estimated d/c date.  Skilled Therapeutic Interventions/Progress Updates:  Pt received in w/c with daughter [name]) & son-in-law Sharl Ma) present. Therapist, pt, and family discussed d/c plan with therapist educating them on need for w/c level goals 2/2 limitations due to orthostatic hypotension. Pt's family reports they have a portable ramp & therapist recommends they install this at the front door. Home measurement sheet returned and per measurements pt's w/c will fit in all rooms except the bathroom. Educated them on use of drop-arm BSC, lateral scoots with slide board, and w/c as primary method of mobility. Sharl Ma reports they purchased a new SUV today - asked him to obtain measurement of seat height. Therapist notified LCSW Kriste Basque) of new DME needs & family exited session. Transported pt to/from gym via w/c total assist for time management. Educated pt on w/c parts management (brakes & armrest) with pt requiring encouragement and significantly extra time & cuing to manage parts. Pt with difficulty gripping & pressing armrest with RUE. Educated pt on use of slide board and head/hips relationship to assist with slide board transfers with fair return demo - pt would benefit from further practice of this. Pt completes w/c<>mat table with slide board & min assist (total assist for slide board placement), cuing & assist with BUE and BLE placement, and significantly extra time. After completing transfer pt reports "that's tiring". At end of session pt left sitting in w/c with quick release belt & chair alarm donned, call bell within reach.   Thigh high teds & abdominal binder donned during session. No c/o dizziness  reported.  Therapy Documentation Precautions:  Precautions Precautions: Fall Restrictions Weight Bearing Restrictions: No   See Function Navigator for Current Functional Status.   Therapy/Group: Individual Therapy  Sandi Mariscal 07/29/2017, 5:17 PM

## 2017-07-29 NOTE — Plan of Care (Signed)
  Problem: Consults Goal: RH STROKE PATIENT EDUCATION Description See Patient Education module for education specifics  Outcome: Progressing   Problem: RH BOWEL ELIMINATION Goal: RH STG MANAGE BOWEL WITH ASSISTANCE Description STG Manage Bowel with mod Assistance.  Outcome: Progressing   Problem: RH BLADDER ELIMINATION Goal: RH STG MANAGE BLADDER WITH ASSISTANCE Description STG Manage Bladder With min. Assistance  Outcome: Progressing   Problem: RH SKIN INTEGRITY Goal: RH STG SKIN FREE OF INFECTION/BREAKDOWN Description With min. assist.  Outcome: Progressing Goal: RH STG MAINTAIN SKIN INTEGRITY WITH ASSISTANCE Description STG Maintain Skin Integrity With min. Assistance.  Outcome: Progressing   Problem: RH SAFETY Goal: RH STG ADHERE TO SAFETY PRECAUTIONS W/ASSISTANCE/DEVICE Description STG Adhere to Safety Precautions With mod.Assistance/Device.  Outcome: Progressing   Problem: RH PAIN MANAGEMENT Goal: RH STG PAIN MANAGED AT OR BELOW PT'S PAIN GOAL Description Less than 3.  Outcome: Progressing   Problem: RH KNOWLEDGE DEFICIT Goal: RH STG INCREASE KNOWLEDGE OF DIABETES Outcome: Progressing Goal: RH STG INCREASE KNOWLEDGE OF HYPERTENSION Outcome: Progressing Goal: RH STG INCREASE KNOWLEDGE OF DYSPHAGIA/FLUID INTAKE Outcome: Progressing   Problem: RH Other (Specify) Goal: RH LTG Other (Specify)1 Outcome: Progressing   

## 2017-07-29 NOTE — Progress Notes (Signed)
Occupational Therapy Session Note  Patient Details  Name: Kelly Romero MRN: [id number] Date of Birth: 07/26/1965  Today's Date: 07/29/2017 OT Individual Time: 4098-1191 OT Individual Time Calculation (min): 75 min    Short Term Goals: Week 3:  OT Short Term Goal 1 (Week 3): STG=LTG due to LOS  Skilled Therapeutic Interventions/Progress Updates:    Pt seen for OT session focusing on ADL re-training and functional transfers. Pt awake in supine upon arrival with RN present administering morning meds. Pt agreeable to tx session and denying pain. She transferred to EOB with min A using hospital bed functions with assist for reciprocal scooting towards EOB. She dressed seated on EOB, improved dynamic sitting balance today compared to yesterdya, able to complete at supervision level while dressing. VCs to adhere to hemi dressing techniques. She stood at [facility] and able to assist with pulling pants up. COmpleted x2 sit>stand and standing trials in order to manage pants up entirely. TED hose and abdominal binder donned for al activities. SHe completed stand pivot transfer to w/c using RW. Pt voiced that she had incontinent episode, agreeable to getting on toilet to attempt void again and to change brief. Completed stand pivot with use of grab bar to toilet, mod A for clothing management. Upon standing to complete pericare/buttock hygiene, pt tolerating ~30 seconds in standing efore becoming unrespoinsve, eyes remaining open. Pt safely lowered back to toilet and RN called into room. Pt became responsive again following ~15 seconds. With +2 for safety and time management in standing, pt returned to w/c. Abdominal binder tightened while in standing and during this stand pt becoming unresponsive again for ~15 seconds. Seated rest break provided in w/c, RN gave clearance to cont with therapy from w/c level. She completed grooming tasks w/c level at sink, able to use B UEs in order to assist with set-up of grooming  tasks- suction used to totally clear oral secretions following pt clearing mouth. Addressed w/c propulsion in hallway for B UE coordination and general strengthening. She was able to use B UEs to assist in propelling, hand over hand with R for effective propulsion technique. In gym, pt removed shoelaces from shoe and replaced by therapist with elastic shoelaces in order to increase independence with ADLs. Pt taken back to room at end of session, left seated with QRB donned and family present. Education provided regarding planned family ed and upcoming d/c.   Therapy Documentation Precautions:  Precautions Precautions: Fall Restrictions Weight Bearing Restrictions: No Pain:   No/denies pain  See Function Navigator for Current Functional Status.   Therapy/Group: Individual Therapy  Elmon Shader L 07/29/2017, 6:55 AM

## 2017-07-29 NOTE — Progress Notes (Signed)
Initial Nutrition Assessment  DOCUMENTATION CODES:   Obesity unspecified  INTERVENTION:  Once PEG placed and cleared for use, Recommend letting pt attempt to eat at meals and if po intake is <50% at that meal, provide a bolus tube feed using Glucerna 1.2 formula at volume of 240 ml given up to three times daily. Additionally provide a HS bolus daily.   Continue 30 ml Prostat BID, each supplement provides 100 kcal and 15 grams of protein.   Provide 200 ml free water flushes 4 times daily per tube between feeds/meals.   Tube feeding regimen if all 4 bolus feeds are given provides 1352 kcal (73% of kcal needs), 88 grams of protein (100% of protein needs), and 1578 ml of water.   Provide Magic cup TID with meals, each supplement provides 290 kcal and 9 grams of protein.  NUTRITION DIAGNOSIS:   Inadequate oral intake related to dysphagia as evidenced by meal completion < 50%, per patient/family report.  GOAL:   Patient will meet greater than or equal to 90% of their needs  MONITOR:   PO intake, Supplement acceptance, Diet advancement, Weight trends, Labs, Skin, I & O's, TF tolerance  REASON FOR ASSESSMENT:   (PEG placement)    ASSESSMENT:   52 year old female with history of HTN, T2DM, depression, HA,medication non-compliance, multiple CVAs most recent 2/12-2/15/19 RH and discharge from [facility] on 07/10/17 due to acute left hemisphere infarcts. She had worsening of slurred speech and right sided weakness and was admitted to [facility] on 07/11/17 for work up. MRI brain repeated and showed few new strokes in parasagittal cortical and subcortical left frontal lobe.   PEG placement rescheduled for tomorrow as lovenox shot was given today.   Pt is currently on a dysphagia 1 diet with honey thick liquids. Meal completion has been varied from 15-65%. Pt reports dislike of current diet. Pt unable to maintain hydration status as liquids are honey thick consistency. Pt with syncope/orthostatic  hypotension. Per MD, orthostatic hypotension likely due to poor po intake. Family and pt has agreed on PEG placement for adequate nutrition and hydration. Family and pt long term goals is continuation of po feeds. Pt does report eating well PTA with no other difficulties. Family willing to bring in meals that are compliant with dysphagia 1 honey thick liquids diet to encourage pt po intake. No significant weight loss per weight records. TF recommendations have been stated above. RD to continue to monitor.   NUTRITION - FOCUSED PHYSICAL EXAM:    Most Recent Value  Orbital Region  No depletion  Upper Arm Region  No depletion  Thoracic and Lumbar Region  No depletion  Buccal Region  No depletion  Temple Region  No depletion  Clavicle Bone Region  No depletion  Clavicle and Acromion Bone Region  No depletion  Scapular Bone Region  No depletion  Dorsal Hand  No depletion  Patellar Region  No depletion  Anterior Thigh Region  No depletion  Posterior Calf Region  No depletion  Edema (RD Assessment)  Mild  Hair  Reviewed  Eyes  Reviewed  Mouth  Reviewed  Skin  Reviewed  Nails  Reviewed       Diet Order:   Diet Order           Diet NPO time specified Except for: Sips with Meds  Diet effective midnight        DIET - DYS 1 Room service appropriate? Yes; Fluid consistency: Honey Thick  Diet effective now  EDUCATION NEEDS:   Education needs have been addressed  Skin:  Skin Assessment: Reviewed RN Assessment  Last BM:  5/14  Height:   Ht Readings from Last 1 Encounters:  07/13/17  (1.651 m)    Weight:   Wt Readings from Last 1 Encounters:  07/28/17 210 lb 1.6 oz (95.3 kg)    Ideal Body Weight:  56.8 kg  BMI:  Body mass index is 34.96 kg/m.  Estimated Nutritional Needs:   Kcal:  1850-2050  Protein:  85-95 grams  Fluid:  >/= 1.8 L/day    Roslyn Smiling, MS, RD, LDN Pager # [phone] After hours/ weekend pager # [phone]

## 2017-07-29 NOTE — Progress Notes (Signed)
Attempted to bring pt down for GT. Pt received Lovenox at 10am today. Will have to reschedule for tomorrow

## 2017-07-29 NOTE — Significant Event (Signed)
Patient refusing oral HS medications. Patient takes medications crushed in puree; RN offered to administer medications with another appropriate puree and offered pt sips of honey thick fluids before administration of medication, but pt continued to refuse. RN educated pt on medication regimen necessity but pt continued to refuse stating "I'm sorry, but I don't want anything". Pt is agreeable with admin of HS insulin. RN will continue to monitor.

## 2017-07-30 ENCOUNTER — Inpatient Hospital Stay (HOSPITAL_COMMUNITY): Payer: Medicaid Other

## 2017-07-30 ENCOUNTER — Inpatient Hospital Stay (HOSPITAL_COMMUNITY): Payer: Medicaid Other | Admitting: Occupational Therapy

## 2017-07-30 ENCOUNTER — Ambulatory Visit (HOSPITAL_COMMUNITY): Payer: Medicaid Other | Admitting: Physical Therapy

## 2017-07-30 ENCOUNTER — Encounter (HOSPITAL_COMMUNITY): Payer: Medicaid Other | Admitting: Speech Pathology

## 2017-07-30 ENCOUNTER — Encounter (HOSPITAL_COMMUNITY): Payer: Self-pay | Admitting: Interventional Radiology

## 2017-07-30 ENCOUNTER — Encounter (HOSPITAL_COMMUNITY): Payer: Medicaid Other | Admitting: Occupational Therapy

## 2017-07-30 HISTORY — PX: IR GASTROSTOMY TUBE MOD SED: IMG625

## 2017-07-30 LAB — GLUCOSE, CAPILLARY
GLUCOSE-CAPILLARY: 113 mg/dL — AB (ref 65–99)
Glucose-Capillary: 89 mg/dL (ref 65–99)
Glucose-Capillary: 150 mg/dL — ABNORMAL HIGH (ref 65–99)
Glucose-Capillary: 156 mg/dL — ABNORMAL HIGH (ref 65–99)

## 2017-07-30 MED ORDER — HYDROMORPHONE HCL 1 MG/ML IJ SOLN: 1.0000 mg | INTRAMUSCULAR | Status: DC | PRN

## 2017-07-30 MED ORDER — SIMETHICONE 40 MG/0.6ML PO SUSP
80.0000 mg | Freq: Four times a day (QID) | ORAL | Status: AC
  Administered 2017-07-30 (×2): 80 mg via ORAL
  Filled 2017-07-30 (×2): qty 1.2

## 2017-07-30 MED ORDER — FENTANYL CITRATE (PF) 100 MCG/2ML IJ SOLN
INTRAMUSCULAR | Status: AC | PRN
  Administered 2017-07-30: 50 ug via INTRAVENOUS

## 2017-07-30 MED ORDER — MIDAZOLAM HCL 2 MG/2ML IJ SOLN
INTRAMUSCULAR | Status: AC | PRN
  Administered 2017-07-30: 1 mg via INTRAVENOUS

## 2017-07-30 MED ORDER — IOPAMIDOL (ISOVUE-300) INJECTION 61%
INTRAVENOUS | Status: AC
  Administered 2017-07-30: 20 mL
  Filled 2017-07-30: qty 50

## 2017-07-30 MED ORDER — HYDROCODONE-ACETAMINOPHEN 5-325 MG PO TABS: 1.0000 | ORAL_TABLET | ORAL | Status: DC | PRN

## 2017-07-30 MED ORDER — HYDROMORPHONE HCL 1 MG/ML IJ SOLN
1.0000 mg | INTRAMUSCULAR | Status: DC | PRN
Start: 2017-07-30 — End: 2017-07-30

## 2017-07-30 MED ORDER — FENTANYL CITRATE (PF) 100 MCG/2ML IJ SOLN
INTRAMUSCULAR | Status: AC
  Filled 2017-07-30: qty 4

## 2017-07-30 MED ORDER — LIDOCAINE HCL 1 % IJ SOLN
INTRAMUSCULAR | Status: AC
Start: 2017-07-30 — End: 2017-07-31
  Filled 2017-07-30: qty 20

## 2017-07-30 MED ORDER — METOPROLOL TARTRATE 25 MG/10 ML ORAL SUSPENSION
12.5000 mg | Freq: Two times a day (BID) | ORAL | Status: AC
  Administered 2017-07-30: 12.5 mg via ORAL
  Filled 2017-07-30: qty 10

## 2017-07-30 MED ORDER — SIMETHICONE 80 MG PO CHEW
80.0000 mg | CHEWABLE_TABLET | Freq: Four times a day (QID) | ORAL | Status: DC
  Administered 2017-07-31 – 2017-08-05 (×21): 80 mg via ORAL
  Filled 2017-07-30 (×22): qty 1

## 2017-07-30 MED ORDER — MIDAZOLAM HCL 2 MG/2ML IJ SOLN
INTRAMUSCULAR | Status: AC
  Filled 2017-07-30: qty 4

## 2017-07-30 MED ORDER — METOPROLOL TARTRATE 12.5 MG HALF TABLET
12.5000 mg | ORAL_TABLET | Freq: Two times a day (BID) | ORAL | Status: DC
  Administered 2017-07-31 – 2017-08-06 (×12): 12.5 mg via ORAL
  Filled 2017-07-30 (×12): qty 1

## 2017-07-30 MED ORDER — GLUCAGON HCL RDNA (DIAGNOSTIC) 1 MG IJ SOLR
INTRAMUSCULAR | Status: AC
  Administered 2017-07-30: 1 mg
  Filled 2017-07-30: qty 2

## 2017-07-30 MED ORDER — ONDANSETRON HCL 4 MG/2ML IJ SOLN: 4.0000 mg | INTRAMUSCULAR | Status: DC | PRN

## 2017-07-30 MED ORDER — HYDROMORPHONE HCL 1 MG/ML IJ SOLN
1.0000 mg | INTRAMUSCULAR | Status: DC | PRN
  Administered 2017-07-30 – 2017-07-31 (×2): 1 mg via INTRAVENOUS
  Filled 2017-07-30 (×2): qty 1

## 2017-07-30 MED ORDER — CEFAZOLIN SODIUM-DEXTROSE 2-4 GM/100ML-% IV SOLN
INTRAVENOUS | Status: AC
  Administered 2017-07-30: 14:00:00
  Filled 2017-07-30: qty 100

## 2017-07-30 MED ORDER — LIDOCAINE HCL 1 % IJ SOLN
INTRAMUSCULAR | Status: AC | PRN
  Administered 2017-07-30: 10 mL

## 2017-07-30 MED ORDER — CEFAZOLIN SODIUM-DEXTROSE 2-4 GM/100ML-% IV SOLN
2.0000 g | Freq: Once | INTRAVENOUS | Status: DC
  Filled 2017-07-30: qty 100

## 2017-07-30 NOTE — Procedures (Signed)
  Procedure: Gastrostomy tube placement 20f EBL:   minimal Complications:  none immediate  See full dictation in [facility] PACS.  D. Naftali Carchi MD Main # [phone] Pager  [phone]    

## 2017-07-30 NOTE — Progress Notes (Signed)
Pt was crying this morning expressing concerns about procedure for PEG placement; needs follow-up with MD; will report to oncoming RN

## 2017-07-30 NOTE — Progress Notes (Signed)
Progress Note  Patient Name: Kelly Romero Date of Encounter: 07/30/2017  Primary Cardiologist: Nanetta Batty, MD   Subjective   No complaints not sure why PEG did not get done yesterday   Inpatient Medications    Scheduled Meds: . aspirin EC  81 mg Oral Daily  . DULoxetine  30 mg Oral Daily  . feeding supplement (PRO-STAT SUGAR FREE 64)  30 mL Oral BID  . insulin aspart  0-15 Units Subcutaneous TID AC & HS  . insulin detemir  20 Units Subcutaneous Daily  . lisinopril  10 mg Oral BID  . metoprolol tartrate  12.5 mg Oral BID  . rosuvastatin  40 mg Oral QHS  . simethicone  80 mg Oral QID  . Valproate Sodium  500 mg Oral BID   Continuous Infusions: . sodium chloride 0.9 % 1,000 mL with potassium chloride 20 mEq infusion Stopped (07/30/17 0650)   PRN Meds: acetaminophen, alum & mag hydroxide-simeth, bisacodyl, diphenhydrAMINE, guaiFENesin-dextromethorphan, polyethylene glycol, prochlorperazine **OR** prochlorperazine **OR** prochlorperazine, sodium phosphate   Vital Signs    Vitals:   07/29/17 2112 07/29/17 2124 07/30/17 0640 07/30/17 0642  BP: (!) 188/77 (!) 183/77 (!) 165/72 (!) 182/77  Pulse: 85 79 85 90  Resp:   18   Temp:   98.8 F (37.1 C)   TempSrc:   Oral   SpO2:   97% 98%  Weight:      Height:        Intake/Output Summary (Last 24 hours) at 07/30/2017 0846 Last data filed at 07/29/2017 1900 Gross per 24 hour  Intake 20 ml  Output -  Net 20 ml   Filed Weights   07/19/17 0134 07/20/17 1804 07/28/17 0409  Weight: 211 lb 13.8 oz (96.1 kg) 210 lb 5.1 oz (95.4 kg) 210 lb 1.6 oz (95.3 kg)    Telemetry  Not on tele  ECG    n/a  Physical Exam   GEN: No acute distress.   Neck: No JVD Cardiac: RRR, no murmurs, rubs, or gallops.  Respiratory: Clear to auscultation bilaterally. GI: Soft, nontender, non-distended  MS: No edema; . Neuro:  R sided weakness, R facial droop. Difficult to understand speech Psych: flat affect   Labs     Chemistry Recent Labs  Lab 07/27/17 0513  NA 143  K 3.8  CL 105  CO2 29  GLUCOSE 124*  BUN 9  CREATININE 1.03*  CALCIUM 9.4  GFRNONAA >60  GFRAA >60  ANIONGAP 9     Hematology Recent Labs  Lab 07/27/17 0513  WBC 4.7  RBC 3.78*  HGB 10.6*  HCT 33.9*  MCV 89.7  MCH 28.0  MCHC 31.3  RDW 12.9  PLT 229    Radiology    No results found.  Cardiac Studies   Echo 07/09/17 Study Conclusions  - Left ventricle: The cavity size was normal. Systolic function was   normal. The estimated ejection fraction was in the range of 60%   to 65%. Wall motion was normal; there were no regional wall   motion abnormalities. Left ventricular diastolic function   parameters were normal. - Mitral valve: There was mild regurgitation. - Right ventricle: Systolic function was normal. - Pulmonary arteries: Systolic pressure was within the normal   range. - Pericardium, extracardiac: A trivial pericardial effusion was   identified.  Impressions:  - Incomplete study as patient started having panic attack and   requested rest of echo be stopped.  Patient Profile     52  y.o. female with hx of recent CVA, vascular disease, diabetes, HTN and HLD with orthostatic hypotension.   Admitted end of April with acute stroke and transferred to inpatient rehab. ILR has been placed. Cardiology has seen the patient 5/7 for syncope. Felt positional due to orthostasis.  Symptoms improved after IV hydration. We signed off 07/22/17. Called again today with orthostatis and dizziness.   Assessment & Plan    1. Orthostatic hypotension- secondary to stroke, diabetes, poor nutrition and prolonged time spent in bed. BP meds have been decreased now just on lisinopril 10 mg ( she is diabetic) and lopressor 12.5 bid. Tolerating some systolic HTN in 160 mmHg-180 mmHg range to help mitigate symptoms on standing and keep systolic BP over 161 mmHg. She appears A bit volume overloaded and is obese on exam and would  not use florinef. Given systolic HTN would not start midodrine at this time either Improved nutrition with PEG will help but She needs aggressive PT/OT and to spend more time OOB  [employer]

## 2017-07-30 NOTE — Progress Notes (Addendum)
Subjective/Complaints:  Appreciate cardiology notes, discussed transfers with OT , don't think she needs sliding board since her standing tolerance is about 15-20sec prior to onset of dizziness and she can complete a transfer in that time ROS: Limited by severe dysarthria and cognition   Objective: Vital Signs: Blood pressure (!) 182/77, pulse 90, temperature 98.8 F (37.1 C), temperature source Oral, resp. rate 18, height  (1.651 m), weight 95.3 kg (210 lb 1.6 oz), SpO2 98 %. No results found. Results for orders placed or performed during the hospital encounter of 07/13/17 (from the past 72 hour(s))  Glucose, capillary     Status: Abnormal   Collection Time: 07/27/17 11:46 AM  Result Value Ref Range   Glucose-Capillary 177 (H) 65 - 99 mg/dL  Glucose, capillary     Status: Abnormal   Collection Time: 07/27/17  5:11 PM  Result Value Ref Range   Glucose-Capillary 131 (H) 65 - 99 mg/dL  Glucose, capillary     Status: Abnormal   Collection Time: 07/27/17  9:32 PM  Result Value Ref Range   Glucose-Capillary 159 (H) 65 - 99 mg/dL  Glucose, capillary     Status: Abnormal   Collection Time: 07/28/17  6:15 AM  Result Value Ref Range   Glucose-Capillary 121 (H) 65 - 99 mg/dL  Glucose, capillary     Status: Abnormal   Collection Time: 07/28/17 11:56 AM  Result Value Ref Range   Glucose-Capillary 185 (H) 65 - 99 mg/dL  Glucose, capillary     Status: Abnormal   Collection Time: 07/28/17  4:46 PM  Result Value Ref Range   Glucose-Capillary 203 (H) 65 - 99 mg/dL  Glucose, capillary     Status: Abnormal   Collection Time: 07/28/17  9:14 PM  Result Value Ref Range   Glucose-Capillary 119 (H) 65 - 99 mg/dL  Glucose, capillary     Status: Abnormal   Collection Time: 07/29/17  6:27 AM  Result Value Ref Range   Glucose-Capillary 122 (H) 65 - 99 mg/dL  Glucose, capillary     Status: Abnormal   Collection Time: 07/29/17 12:07 PM  Result Value Ref Range   Glucose-Capillary 142 (H) 65 -  99 mg/dL  Glucose, capillary     Status: Abnormal   Collection Time: 07/29/17  4:19 PM  Result Value Ref Range   Glucose-Capillary 131 (H) 65 - 99 mg/dL  Glucose, capillary     Status: Abnormal   Collection Time: 07/29/17  9:38 PM  Result Value Ref Range   Glucose-Capillary 124 (H) 65 - 99 mg/dL  Glucose, capillary     Status: None   Collection Time: 07/30/17  6:47 AM  Result Value Ref Range   Glucose-Capillary 89 65 - 99 mg/dL     Constitutional: No distress . Vital signs reviewed. HEENT: EOMI, oral membranes moist Neck: supple Cardiovascular: RRR without murmur. No JVD    Respiratory: CTA Bilaterally without wheezes or rales. Normal effort    GI: BS +, non-tender, non-distended  Musc: No edema or tenderness in extremities. Skin:   Intact. Warm and dry.    Neuro:  Alert Right facial droop persistent Alert/ oriented to person not time Motor: 4/5 RUE proximal to distal- stable 4+/5 LUE proximal to distal- stable RLE: 3-/5 HF, KE, ADF, 4/5 (stable) LLE: 3-/5 HF, KE, ADF stable Dysarthria Psych: f flat    Assessment/Plan: 1. Functional deficits secondary to Right hemiparesis , LLE paresisand dysarthria, Dysphagia and cognitive deficits related to Left periventricular and R  paramedian sgital infarcts which require 3+ hours per day of interdisciplinary therapy in a comprehensive inpatient rehab setting. Physiatrist is providing close team supervision and 24 hour management of active medical problems listed below. Physiatrist and rehab team continue to assess barriers to discharge/monitor patient progress toward functional and medical goals. FIM: Function - Bathing Bathing activity did not occur: Refused Position: Shower Body parts bathed by patient: Chest, Abdomen, Front perineal area, Right upper leg, Left upper leg, Right arm, Left arm Body parts bathed by helper: Right lower leg, Left lower leg, Back, Buttocks  Function- Upper Body Dressing/Undressing What is the patient  wearing?: Pull over shirt/dress Pull over shirt/dress - Perfomed by patient: Thread/unthread left sleeve, Put head through opening, Pull shirt over trunk, Thread/unthread right sleeve Pull over shirt/dress - Perfomed by helper: Thread/unthread right sleeve Assist Level: Supervision or verbal cues Function - Lower Body Dressing/Undressing What is the patient wearing?: Pants, Ted Hose, Shoes Position: Sitting EOB Underwear - Performed by helper: Thread/unthread right underwear leg, Thread/unthread left underwear leg, Pull underwear up/down Pants- Performed by patient: Thread/unthread right pants leg, Thread/unthread left pants leg Pants- Performed by helper: Pull pants up/down Non-skid slipper socks- Performed by helper: Don/doff right sock, Don/doff left sock Socks - Performed by helper: Don/doff right sock, Don/doff left sock Shoes - Performed by patient: Don/doff right shoe, Fasten right Shoes - Performed by helper: Don/doff left shoe, Fasten left, Don/doff right shoe, Fasten right TED Hose - Performed by helper: Don/doff right TED hose, Don/doff left TED hose Assist for footwear: Partial/moderate assist Assist for lower body dressing: 2 Helpers  Function - Toileting Toileting steps completed by patient: Adjust clothing prior to toileting, Performs perineal hygiene Toileting steps completed by helper: Adjust clothing prior to toileting, Adjust clothing after toileting Toileting Assistive Devices: Grab bar or rail Assist level: Two helpers  Function - Archivist transfer assistive device: Grab bar Assist level to toilet: Touching or steadying assistance (Pt > 75%) Assist level from toilet: Touching or steadying assistance (Pt > 75%) Assist level to bedside commode (at bedside): Moderate assist (Pt 50 - 74%/lift or lower)(per Leann Nickles-Wright, NT) Assist level from bedside commode (at bedside): Moderate assist (Pt 50 - 74%/lift or lower)  Function - Chair/bed  transfer Chair/bed transfer method: Lateral scoot Chair/bed transfer assist level: Touching or steadying assistance (Pt > 75%) Chair/bed transfer assistive device: Sliding board Chair/bed transfer details: Tactile cues for posture, Verbal cues for technique, Verbal cues for sequencing  Function - Locomotion: Wheelchair Will patient use wheelchair at discharge?: No Type: Manual Wheelchair activity did not occur: Safety/medical concerns Max wheelchair distance: 50 Assist Level: Touching or steadying assistance (Pt > 75%) Wheel 50 feet with 2 turns activity did not occur: Safety/medical concerns Assist Level: Touching or steadying assistance (Pt > 75%) Wheel 150 feet activity did not occur: Safety/medical concerns Function - Locomotion: Ambulation Assistive device: Walker-rolling Max distance: 10 Assist level: Touching or steadying assistance (Pt > 75%) Assist level: Touching or steadying assistance (Pt > 75%) Assist level: Touching or steadying assistance (Pt > 75%) Walk 150 feet activity did not occur: Safety/medical concerns Assist level: Moderate assist (Pt 50 - 74%) Assist level: Moderate assist (Pt 50 - 74%)  Function - Comprehension Comprehension: Auditory Comprehension assist level: Follows basic conversation/direction with extra time/assistive device  Function - Expression Expression: Verbal Expression assist level: Expresses basic 75 - 89% of the time/requires cueing 10 - 24% of the time. Needs helper to occlude trach/needs to repeat words.  Function -  Social Interaction Social Interaction assist level: Interacts appropriately 90% of the time - Needs monitoring or encouragement for participation or interaction.  Function - Problem Solving Problem solving assist level: Solves basic 75 - 89% of the time/requires cueing 10 - 24% of the time  Function - Memory Memory assist level: Recognizes or recalls 50 - 74% of the time/requires cueing 25 - 49% of the time Patient  normally able to recall (first 3 days only): Current season, That he or she is in a hospital, Location of own room   Medical Problem List and Plan:  1. 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 DVT Prophylaxis/Anticoagulation: Pharmaceutical: Lovenox  q 24h 3. Chronic HA/Pain Management: Depakote bid with tylenol prn.  Lower extremity paresthesias  Trial of Cymbalta 4. Mood: LCSW to follow for evaluation and support.    Emotional lability trial of Cymbalta.    If this is not helpful may consider SSRI 5. Neuropsych: This patient is not fully capable of making decisions on her own behalf. Appreciate input   6. Skin/Wound Care: routine pressure relief measures.  7. Fluids/Electrolytes/Nutrition: Monitor I/Os  8. 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 Monitor BS ac/hs. Educate on importance of compliance.  CBG (last 3)  Recent Labs    07/29/17 1619 07/29/17 2138 07/30/17 0647  GLUCAP 131* 124* 89    controlled on 5/15 9. Dyslipidemia: On Crestor.  10. Anemia: Hgb stable at 9.5 on 5/1, improved to 10.6 on 5/14 11. HTN with orthostasis- will check q shift,standing tolerance 15-20s  Monitor BP bid.   -no orthostasis today  -bp's have been trending too high  -lisinopril is  bid,, need to measure BPs in sitting position not while in bed.  Appreciate card input Per cards not a good candidate for midodrine or florinef Discussed with pt and family that this is most likely an autonomic neuropathy and will be managed but not "fixed"   -added low dose metoprolol 12.5mg  5/11 with some improvement Lying BPs need to be high to compensate for orthostatic  drops Vitals:   07/30/17 0640 07/30/17 0642  BP: (!) 165/72 (!) 182/77  Pulse: 85 90  Resp: 18   Temp: 98.8 F (37.1 C)   SpO2: 97% 98%   Ordered TEDs  Abdominal binder    Continue to monitor- IVF per Cardiology, should be able to d/c once PEG is place and free H20 can be administered  12. Hypoalbuminemia  Supplement initiated on 5/4 13.  AMS probable seizure,CT neg forhemmorhagic transformation, EEG neg for seizure , slowing noted,  On Depakene, reviewed Neuro outpt note, possible seizure in 2017 , has also been treated for migraines per outpt Neuro Dr Everlena Cooper, added Keppra to Valproate depakene however the "seizure" may have been near syncope from orthostatic event 14.  Mild abd tenderness improved KUB neg 15.  Dysphagia moderately severe- supplement fluids with IV at noc  Decision per pt and daughter to proceed wit PEG- to be placed today 5/17- appreciate dietary consult with recs for bolus feed schedule Should be able to start TF tomorrowRec is to give bolus if pt takes less than 50% meal.  Will also need Free H20  TID .  Will be able to d/c IV after G tube is placed  -have held plavix (last dose received 5/11) in expectation of placement this week 5/16 or 5/17        LOS (Days) 17 A FACE TO FACE EVALUATION WAS PERFORMED  Erick Colace 07/30/2017, 9:49 AM

## 2017-07-30 NOTE — Progress Notes (Signed)
Pt left unit via bed with transport for IR Peg placement. Consent signed and in tow. Pt awake and alert; aware of procedure.  Ronnette Juniper, LPN

## 2017-07-30 NOTE — Progress Notes (Signed)
Report received from [place] in IR. GT placed- states new orders being placed by MD. Ronnette Juniper, LPN

## 2017-07-30 NOTE — Plan of Care (Signed)
  Problem: Consults Goal: RH STROKE PATIENT EDUCATION Description See Patient Education module for education specifics  Outcome: Progressing   Problem: RH BOWEL ELIMINATION Goal: RH STG MANAGE BOWEL WITH ASSISTANCE Description STG Manage Bowel with mod Assistance.  Outcome: Progressing   Problem: RH BLADDER ELIMINATION Goal: RH STG MANAGE BLADDER WITH ASSISTANCE Description STG Manage Bladder With min. Assistance  Outcome: Progressing   Problem: RH SKIN INTEGRITY Goal: RH STG SKIN FREE OF INFECTION/BREAKDOWN Description With min. assist.  Outcome: Progressing Goal: RH STG MAINTAIN SKIN INTEGRITY WITH ASSISTANCE Description STG Maintain Skin Integrity With min. Assistance.  Outcome: Progressing   Problem: RH SAFETY Goal: RH STG ADHERE TO SAFETY PRECAUTIONS W/ASSISTANCE/DEVICE Description STG Adhere to Safety Precautions With mod.Assistance/Device.  Outcome: Progressing   Problem: RH PAIN MANAGEMENT Goal: RH STG PAIN MANAGED AT OR BELOW PT'S PAIN GOAL Description Less than 3.  Outcome: Progressing   Problem: RH KNOWLEDGE DEFICIT Goal: RH STG INCREASE KNOWLEDGE OF DIABETES Outcome: Progressing Goal: RH STG INCREASE KNOWLEDGE OF HYPERTENSION Outcome: Progressing Goal: RH STG INCREASE KNOWLEDGE OF DYSPHAGIA/FLUID INTAKE Outcome: Progressing   Problem: RH Other (Specify) Goal: RH LTG Other (Specify)1 Outcome: Progressing   

## 2017-07-30 NOTE — Progress Notes (Signed)
Occupational Therapy Session Note  Patient Details  Name: Kelly Romero MRN: [id number] Date of Birth: 1965/11/20  Today's Date: 07/30/2017 OT Individual Time: 2080-2233 OT Individual Time Calculation (min): 60 min    Short Term Goals: Week 1:  OT Short Term Goal 1 (Week 1): Pt will maintain dynamic standing balance during toileting task with CGA OT Short Term Goal 1 - Progress (Week 1): Met OT Short Term Goal 2 (Week 1): Pt will complete LB dressing with steadying assist only OT Short Term Goal 2 - Progress (Week 1): Met OT Short Term Goal 3 (Week 1): Pt will use R UE during bathing task with min cuing for awareness OT Short Term Goal 3 - Progress (Week 1): Met OT Short Term Goal 4 (Week 1): Pt will consistently be oriented x4 with min questioning cues OT Short Term Goal 4 - Progress (Week 1): Not met Week 2:  OT Short Term Goal 1 (Week 2): Pt will ambulate into bathroom with CGA using LRAD. OT Short Term Goal 1 - Progress (Week 2): Not met OT Short Term Goal 2 (Week 2): Pt will complete 2 grooming tasks standing at sink with close supervision in order to increase functional activity tolerance OT Short Term Goal 2 - Progress (Week 2): Not met OT Short Term Goal 3 (Week 2): Pt will complete 3/3 toileting tasks with guarding assist OT Short Term Goal 3 - Progress (Week 2): Not met OT Short Term Goal 4 (Week 2): Pt will use R UE at stabilizer level mod I OT Short Term Goal 4 - Progress (Week 2): Met Week 3:  OT Short Term Goal 1 (Week 3): STG=LTG due to LOS  Skilled Therapeutic Interventions/Progress Updates:    Pt seen this session with family education for basic mobility and sitting balance.  Pt received in bed and stated nursing had wiped her down to prepare for procedure today.  Pt had a wet brief and needed to be changed. She was able to roll with S and bridge with cues. Discussed with family that bridges in bed and B knee flex/ ext are a great way for pt to keep her legs strong  when her blood pressure prohibits her from standing.  Pt cleansed her front perineal area.  TED hose donned.  Pt rolled fully onto L side for 1-2 minutes to allow for increased transition time to change physical position. Pt sat to EOB and cued for midline position as she leans to R.  Pt worked on A/AROM of L shoulder flexion, scapular elevation and retraction. Family education on how to facilitate movements. Pt's next OT arrived for her next session.     Therapy Documentation Precautions:  Precautions Precautions: Fall Restrictions Weight Bearing Restrictions: No    Vital Signs: Therapy Vitals Temp: 98.8 F (37.1 C) Temp Source: Oral Pulse Rate: 90 Resp: 18 BP: (!) 182/77 Patient Position (if appropriate): Lying Oxygen Therapy SpO2: 98 % O2 Device: Room Air Pain: Pain Assessment Pain Scale: 0-10 Pain Score: 0-No pain ADL:    See Function Navigator for Current Functional Status.   Therapy/Group: Individual Therapy  Clarinda 07/30/2017, 10:10 AM

## 2017-07-30 NOTE — Progress Notes (Signed)
Occupational Therapy Session Note  Patient Details  Name: Kelly Romero MRN: [id number] Date of Birth: 1965-09-23  Today's Date: 07/30/2017 OT Individual Time: 0930-1030 OT Individual Time Calculation (min): 60 min    Short Term Goals: Week 3:  OT Short Term Goal 1 (Week 3): STG=LTG due to LOS  Skilled Therapeutic Interventions/Progress Updates:    Pt seen for OT session focusing on caregiver training, education and problem solving. Pt sitting EOB upon arrival with hand off from therapist. Pt's daughter and son-in-law present.  Discussed pt's medical status of standing tolerance ~15-20 seconds before syncope episodes occur, MD does not expect this to improve much at d/c so recommending limited standing for toileting and transfers.   Problem solving toileting tasks at d/c, pt demonstrated ability to complete "push up" on BSC arm rests in order for caregivers to provide total A for clothing management in order to limit time in standing.  She dressed seated EOB, VCs to adhere to hemi dressing techniques though she could correctly recall them independently. Pt with R lean and LOB during static and dynanmic sitting tasks requiring tactile and verbal cuing to return to midline.  Completed squat pivot transfers requiring mod A as pt with difficulty motor planning transfer, believe this can improve with increased practice. Stand pivot transfers with RW and mod A, performed next trial with HHA which pt did better with as she has difficulty managing RW.  Education provided to pt's family regarding modified ADLs, transfer techniques, pt's cognitive deficits requiring increased time for processing and motor planning. They will benefit from cont hands on training and have discussed with CSW and MD extending LOS for cont hands on training as well as achieving medical stability. Pt tearful throughout session stating " I don't want to live like this". Provided encouragement and empathetic listening. Pt's  family very motivated to take her home.  Pt left with hand off to PT for cont family ed.   Therapy Documentation Precautions:  Precautions Precautions: Fall Restrictions Weight Bearing Restrictions: No Pain:    No/denies pain See Function Navigator for Current Functional Status.   Therapy/Group: Individual Therapy  Rolly Magri L 07/30/2017, 6:22 AM

## 2017-07-30 NOTE — Progress Notes (Signed)
Physical Therapy Session Note  Patient Details  Name: RITIKA HELLICKSON MRN: [id number] Date of Birth: January 25, 1966  Today's Date: 07/30/2017 PT Individual Time: 1030-1130 PT Individual Time Calculation (min): 60 min   Short Term Goals: Week 3:  PT Short Term Goal 1 (Week 3): STG = LTG due to estimated d/c date.  Skilled Therapeutic Interventions/Progress Updates:    Pt up in w/c following OT session, agreeable to PT session. Pt states that she doing okay, getting tearful regarding thought of not improving with dizziness. Transfers: w/c <> mat table with slight inclined surface using sliding board. Repeated cues needed for sequencing as well as time and encouragement. Sit<>stand performed x 4 using rw. With limitations due to reports of fatigue and mild dizziness. Able to perform stand pivot with rw X2 (w/c<>bed). Had planned to perform Family Edu. But no family present. Incorporating w/c parts management into session during rest breaks. Pt needed repeated verbal and visual cues. Following session pt returned to room. Chair alarm on and all needs in reach.   Therapy Documentation Precautions:  Precautions Precautions: Fall Restrictions Weight Bearing Restrictions: No Pain: Pain Assessment Pain Scale: 0-10 Pain Score: 0-No pain  See Function Navigator for Current Functional Status.   Therapy/Group: Individual Therapy  Delton See, PT 07/30/2017, 12:54 PM

## 2017-07-30 NOTE — Progress Notes (Signed)
Speech Language Pathology Daily Session Note  Patient Details  Name: Kelly Romero MRN: [id number] Date of Birth: 28-Jul-1965  Today's Date: 07/30/2017 SLP Individual Time: 1135-1200 SLP Individual Time Calculation (min): 25 min  Short Term Goals: Week 3: SLP Short Term Goal 1 (Week 3): Pt will utilize word finding strategies to convey basic thoughts and ideas with supervision A cues.  SLP Short Term Goal 2 (Week 3): Pt will utilize speech intelligibility strategies with Mod A  to increase speech intelligibility to ~ 90% at the phrase level.  SLP Short Term Goal 3 (Week 3): Pt will solve semi-complex problem solving tasks with Mod A cues.  SLP Short Term Goal 4 (Week 3): Pt will demonstrate selective attention in moderately distracting environment for 30 minutes with Min A cues.  SLP Short Term Goal 5 (Week 3): Pt will consume puree with honey thick liquids and minimal s/s of aspiration and Min A for useof compensatory swallow strategies.   Skilled Therapeutic Interventions:  Pt was seen for family education.  SLP provided skilled education and demonstration regarding how to thicken liquids to the appropriate viscosity with pt's daughter and son in law.  SLP demonstrated how to use a plastic spoon to determine once liquids had reached honey thick consistency.  SLP also discussed recommended pureed textures and provided pt's family with a list of foods allowed on current diet as well as foods to avoid.   All questions were answered to pt's and family's satisfaction at this time.  Pt was handed off to transport tech for PEG placement.  Continue per current plan of care.   Function:  Eating Eating                 Cognition Comprehension Comprehension assist level: Follows basic conversation/direction with extra time/assistive device  Expression   Expression assist level: Expresses basic 75 - 89% of the time/requires cueing 10 - 24% of the time. Needs helper to occlude trach/needs to  repeat words.  Social Interaction Social Interaction assist level: Interacts appropriately 90% of the time - Needs monitoring or encouragement for participation or interaction.  Problem Solving Problem solving assist level: Solves basic 75 - 89% of the time/requires cueing 10 - 24% of the time  Memory Memory assist level: Recognizes or recalls 50 - 74% of the time/requires cueing 25 - 49% of the time    Pain Pain Assessment Pain Scale: 0-10 Pain Score: 0-No pain  Therapy/Group: Individual Therapy  Neco Kling, Melanee Spry 07/30/2017, 1:50 PM

## 2017-07-31 ENCOUNTER — Encounter (HOSPITAL_COMMUNITY): Payer: Medicaid Other | Admitting: Occupational Therapy

## 2017-07-31 ENCOUNTER — Inpatient Hospital Stay (HOSPITAL_COMMUNITY): Payer: Medicaid Other

## 2017-07-31 LAB — CBC WITH DIFFERENTIAL/PLATELET
Abs Immature Granulocytes: 0.1 10*3/uL (ref 0.0–0.1)
Basophils Absolute: 0 10*3/uL (ref 0.0–0.1)
Basophils Relative: 0 %
EOS ABS: 0 10*3/uL (ref 0.0–0.7)
EOS PCT: 0 %
HEMATOCRIT: 31.5 % — AB (ref 36.0–46.0)
HEMOGLOBIN: 9.8 g/dL — AB (ref 12.0–15.0)
Immature Granulocytes: 1 %
LYMPHS ABS: 1.2 10*3/uL (ref 0.7–4.0)
Lymphocytes Relative: 14 %
MCH: 28 pg (ref 26.0–34.0)
MCHC: 31.1 g/dL (ref 30.0–36.0)
MCV: 90 fL (ref 78.0–100.0)
MONO ABS: 0.8 10*3/uL (ref 0.1–1.0)
Monocytes Relative: 9 %
Neutro Abs: 6.3 10*3/uL (ref 1.7–7.7)
Neutrophils Relative %: 76 %
Platelets: 216 10*3/uL (ref 150–400)
RBC: 3.5 MIL/uL — AB (ref 3.87–5.11)
RDW: 13.1 % (ref 11.5–15.5)
WBC: 8.4 10*3/uL (ref 4.0–10.5)

## 2017-07-31 LAB — BASIC METABOLIC PANEL
ANION GAP: 9 (ref 5–15)
BUN: 21 mg/dL — AB (ref 6–20)
CALCIUM: 9 mg/dL (ref 8.9–10.3)
CO2: 25 mmol/L (ref 22–32)
Chloride: 106 mmol/L (ref 101–111)
Creatinine, Ser: 1.42 mg/dL — ABNORMAL HIGH (ref 0.44–1.00)
GFR calc Af Amer: 48 mL/min — ABNORMAL LOW (ref >60)
GFR calc non Af Amer: 41 mL/min — ABNORMAL LOW (ref >60)
GLUCOSE: 188 mg/dL — AB (ref 65–99)
Potassium: 3.8 mmol/L (ref 3.5–5.1)
Sodium: 140 mmol/L (ref 135–145)

## 2017-07-31 LAB — GLUCOSE, CAPILLARY
GLUCOSE-CAPILLARY: 198 mg/dL — AB (ref 65–99)
GLUCOSE-CAPILLARY: 199 mg/dL — AB (ref 65–99)
GLUCOSE-CAPILLARY: 164 mg/dL — AB (ref 65–99)
Glucose-Capillary: 148 mg/dL — ABNORMAL HIGH (ref 65–99)

## 2017-07-31 LAB — LACTIC ACID, PLASMA: Lactic Acid, Venous: 1 mmol/L (ref 0.5–1.9)

## 2017-07-31 MED ORDER — NALOXONE HCL 0.4 MG/ML IJ SOLN
INTRAMUSCULAR | Status: AC
  Administered 2017-07-31: 0.2 mg via INTRAVENOUS
  Filled 2017-07-31: qty 1

## 2017-07-31 MED ORDER — NALOXONE HCL 0.4 MG/ML IJ SOLN
0.4000 mg | Freq: Once | INTRAMUSCULAR | Status: AC
  Administered 2017-07-31: 0.4 mg via INTRAVENOUS

## 2017-07-31 MED ORDER — NALOXONE HCL 0.4 MG/ML IJ SOLN
INTRAMUSCULAR | Status: AC
  Administered 2017-07-31: 0.4 mg via INTRAVENOUS
  Filled 2017-07-31: qty 1

## 2017-07-31 MED ORDER — FREE WATER
200.0000 mL | Freq: Four times a day (QID) | Status: DC
  Administered 2017-07-31 – 2017-08-01 (×3): 200 mL

## 2017-07-31 MED ORDER — GLUCERNA 1.2 CAL PO LIQD
240.0000 mL | Freq: Three times a day (TID) | ORAL | Status: DC
  Administered 2017-07-31 – 2017-08-06 (×20): 240 mL
  Filled 2017-07-31 (×31): qty 474

## 2017-07-31 MED ORDER — SODIUM CHLORIDE 0.9 % IV BOLUS
1000.0000 mL | Freq: Once | INTRAVENOUS | Status: AC
Start: 2017-07-31 — End: 2017-07-31
  Administered 2017-07-31: 1000 mL via INTRAVENOUS

## 2017-07-31 MED ORDER — NALOXONE HCL 0.4 MG/ML IJ SOLN
0.4000 mg | INTRAMUSCULAR | Status: DC | PRN
  Administered 2017-07-31: 0.2 mg via INTRAVENOUS

## 2017-07-31 MED ORDER — SODIUM CHLORIDE 0.9 % IV SOLN
INTRAVENOUS | Status: DC
  Administered 2017-07-31 – 2017-08-01 (×3): via INTRAVENOUS

## 2017-07-31 NOTE — Progress Notes (Signed)
Patient ID: Kelly Romero, female   DOB: 04-17-1965, 52 y.o.   MRN: [id number] Gastrostomy tube intact, insertion site ok, abd soft; ok to use tube for feeds, etc

## 2017-07-31 NOTE — Progress Notes (Signed)
Started NS bolus over 2 hrs per MD order. Pt being transported via bed to CT. Will f/up when return.

## 2017-07-31 NOTE — Progress Notes (Signed)
Pt had been very lethargic and not responding to commands. VS was stable. Called MD to receive order for Narcan. Rapid response called to assess pt. Helle RN with Rapid came to admin Narcan to pt. Pt is awake alert and able to respond to commands per baseline. Pt is sitting in bed with family at bedside. Call bell is in place and bed in lowest position with side rails up. Will cont to monitor

## 2017-07-31 NOTE — Progress Notes (Addendum)
Occupational Therapy Session Note  Patient Details  Name: Kelly Romero MRN: [id number] Date of Birth: 05-25-1965  Today's Date: 07/31/2017 OT Individual Time: 4098-1191 OT Individual Time Calculation (min): 30 min  30 minutes missed time  Skilled Therapeutic Interventions/Progress Updates:    Pt greeted supine in bed. Not responding to therapist's inquiries or communicating with gestures/head nods with instruction. She did not appear in distress or in pain, and eyes were intermittently looking at OT. BP: 126/51. 02 sats fluctuating between 82-92% on RA. RN made aware. Tx focus on initiation and following 1 step instruction during face washing, hair brushing, hand washing, and lotion application. Pt ultimately requiring HOH and Total A for these tasks. 30 minutes missed due to lethargy/behavior.  Family was not present for scheduled family education.  Therapy Documentation Precautions:  Precautions Precautions: Fall Restrictions Weight Bearing Restrictions: No Vital Signs: Therapy Vitals Pulse Rate: 81 Resp: 18 BP: (!) 102/50 Patient Position (if appropriate): Lying Oxygen Therapy SpO2: 92 % O2 Device: Room Air   ADL:     See Function Navigator for Current Functional Status.   Therapy/Group: Individual Therapy  Casson Catena A Maycie Luera 07/31/2017, 10:01 AM

## 2017-07-31 NOTE — Progress Notes (Signed)
Nutrition Brief Follow Up Note  RD consulted for TF initiation/management.   Patient had PEG placed yesterday due to continued poor PO intake.   On chart review, appears RD who normally covers floor has already come up with detailed nutrition plan (see note 5/16).   Will implement TF and supplement regimen dictated in her note.    Christophe Louis RD, LDN, CNSC Clinical Nutrition Available Tues-Sat via Pager: [phone] 07/31/2017 3:53 PM

## 2017-07-31 NOTE — Progress Notes (Signed)
Pt has drowsiness and difficult to follow command. VS 95/40, HR 84, RR 12, O2 92. Called Dr Cato Mulligan to notify. Orders placed per MD order : STAT CBC, BMP, Lactic acid, blood cultures, CXR and CT head w/o contrast. Stated will come see pt within the hour.

## 2017-07-31 NOTE — Progress Notes (Signed)
Subjective/Complaints:  Patient has no complaints.  She is slow to respond.  She is alert.  She seems to answer yes/no questions.  She does this by shaking or nodding her head.   Objective: Vital Signs: Blood pressure (!) 141/74, pulse (!) 108, temperature 99 F (37.2 C), temperature source Oral, resp. rate 18, height  (1.651 m), weight 210 lb 1.6 oz (95.3 kg), SpO2 91 %.   No acute distress.  HEENT exam atraumatic, normocephalic, extractor muscles are intact.  Neck is supple.  Chest is clear to auscultation.  Cardiac exam S1-S2 are regular Abdominal exam active bowel sounds, soft. Extremities without significant edema.   Assessment/Plan: 1. Functional deficits secondary to Right hemiparesis , LLE paresisand dysarthria, Dysphagia and cognitive deficits related to Left periventricular and R paramedian sgital infarcts    Medical Problem List and Plan:  1. 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 DVT Prophylaxis/Anticoagulation: Pharmaceutical: Lovenox  q 24h 3. Chronic HA/Pain Management:  She currently denies pain. 4. Mood: LCSW to follow for evaluation and support.    Emotional lability trial of Cymbalta .Marland Kitchen See below..    If this is not helpful may consider SSRI 5. Neuropsych: This patient is not fully capable of making decisions on her own behalf. Appreciate input   6. Skin/Wound Care: routine pressure relief measures.  7. Fluids/Electrolytes/Nutrition: Monitor I/Os  8. 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 Monitor BS ac/hs. Educate on importance of compliance.  CBG (last 3)  Recent Labs    07/30/17 1723 07/30/17 2124 07/31/17 0640   GLUCAP 150* 156* 198*    controlled on 5/15 9. Dyslipidemia: On Crestor.  10. Anemia: Hgb stable at 9.5 on 5/1, improved to 10.6 on 5/14 11. HTN -there has been some concern with orthostasis.  Seems to be improved. Vitals:   07/31/17 0032 07/31/17 0034  BP: (!) 155/68 (!) 141/74  Pulse: (!) 107 (!) 108  Resp: 18 18  Temp: 99 F (37.2 C)   SpO2: 90% 91%   Currently has TEDs and abdominal binder    Continue to monitor- IVF per Cardiology, should be able to d/c once PEG is place and free H20 can be administered  12. Hypoalbuminemia  Supplement initiated on 5/4 13.  AMS probable seizure,CT neg forhemmorhagic transformation, EEG neg for seizure , slowing noted,  On Depakene, reviewed Neuro outpt note, possible seizure in 2017 , has also been treated for migraines per outpt Neuro Dr Everlena Cooper, added Keppra to Valproate depakene however the "seizure" may have been near syncope from orthostatic event 14.  Mild abd tenderness improved KUB neg 15.  Dysphagia moderately severe- supplement fluids with IV at noc  Had PEG placed yesterday.   Addendum: Patient's nurse called me at approximately 11:20 AM.  States that the patient has been more lethargic since yesterday.  At that time she had a PEG placed.  Patient apparently also had 1 or 2 doses of Dilaudid.  I have reviewed the patient's medication list.  She has a number of psychotropic medications.  I am going to discontinue several of them.  These will include the Dilaudid, hydrocodone, diphenhydramine, prochlorperazine,Duloxetine (this was just started)  LOS (Days) 18 A FACE TO FACE EVALUATION WAS PERFORMED  Trezure Cronk H Correy Weidner 07/31/2017, 6:42 AM

## 2017-07-31 NOTE — Progress Notes (Signed)
Pt returned from imaging via transport. Had CT and CXR completed. Family returned at bedside and notified of updates on pt

## 2017-07-31 NOTE — Plan of Care (Signed)
  Problem: Consults Goal: RH STROKE PATIENT EDUCATION Description See Patient Education module for education specifics  Outcome: Progressing   Problem: RH BOWEL ELIMINATION Goal: RH STG MANAGE BOWEL WITH ASSISTANCE Description STG Manage Bowel with mod Assistance.  Outcome: Progressing   Problem: RH BLADDER ELIMINATION Goal: RH STG MANAGE BLADDER WITH ASSISTANCE Description STG Manage Bladder With min. Assistance  Outcome: Progressing   Problem: RH SKIN INTEGRITY Goal: RH STG SKIN FREE OF INFECTION/BREAKDOWN Description With min. assist.  Outcome: Progressing Goal: RH STG MAINTAIN SKIN INTEGRITY WITH ASSISTANCE Description STG Maintain Skin Integrity With min. Assistance.  Outcome: Progressing   Problem: RH SAFETY Goal: RH STG ADHERE TO SAFETY PRECAUTIONS W/ASSISTANCE/DEVICE Description STG Adhere to Safety Precautions With mod.Assistance/Device.  Outcome: Progressing   Problem: RH PAIN MANAGEMENT Goal: RH STG PAIN MANAGED AT OR BELOW PT'S PAIN GOAL Description Less than 3.  Outcome: Progressing

## 2017-07-31 NOTE — Significant Event (Signed)
Rapid Response Event Note  Overview: Time Called: 1124 Arrival Time: 1128 Event Type: Neurologic  Initial Focused Assessment: Patient with G tube placement yesterday in IR.  2 doses of Dilaudid over night. This Am patient difficult to arouse. BP 126/57  HR 85  RR 14  O2 sat 90% on RA  Temp 98.9 Lung sounds clear, decreased bases  Interventions: 0.4mg  Narcan given IV slowly Patient awake and at neuro baseline. Family at bedside  Plan of Care (if not transferred): RN to  Continue to reassess patient's mental status as the day progresses. Call if additional narcan need, or assistance needed.  Event Summary: Name of Physician Notified: Dr Cato Mulligan at 1115    at    Outcome: Stayed in room and stabalized  Event End Time: 1150  Marcellina Millin

## 2017-08-01 ENCOUNTER — Inpatient Hospital Stay (HOSPITAL_COMMUNITY): Payer: Medicaid Other | Admitting: Occupational Therapy

## 2017-08-01 LAB — GLUCOSE, CAPILLARY
GLUCOSE-CAPILLARY: 144 mg/dL — AB (ref 65–99)
GLUCOSE-CAPILLARY: 171 mg/dL — AB (ref 65–99)
GLUCOSE-CAPILLARY: 141 mg/dL — AB (ref 65–99)
GLUCOSE-CAPILLARY: 143 mg/dL — AB (ref 65–99)

## 2017-08-01 MED ORDER — FREE WATER
200.0000 mL | Status: DC
  Administered 2017-08-01 – 2017-08-02 (×7): 200 mL

## 2017-08-01 NOTE — Progress Notes (Signed)
Pt has had emotional day this shift. Has been tearful x 3. LPN sat with pt and gave emotional support. Asked pt to explain feelings and what is making her so sad. Was unable to really determine cause of crying. Kept looking at gtube and then would cry. Asked pt if she is sad due to tube. Pt stated she did not want tube. I explained that with her not eating and drinking the gtube was going to help her get better and build strength. During supper encouraged pt to eat as much as possible. Only ate about 5 bites of mashed potatoes and 6 sips of apple juice. Pt said "no more" has tolerated feedings and flushes throughout shift.   Pt is unable to tell that she needs to use bathroom. Checked pt throughout shift and has wet the bed. Pt encouraged to take a shower and get cleaned up. Pt agreed. Shower given x 2 staff. Transferred to bathroom via s/p x 2 to wc then into shower stool x 2. Pt has increased lean to the R side and greater R side weakness. Transferred out of bathroom via stedy x 3. Pt dressed and placed back to bed. Stated she felt much better.   Pt has complaints of spasm like pain to gtube site especially after feedings and flushes. Tylenol has been given throughout shift multiple times. Ice pack placed on abdomen and seemed to be effective for a short period.   Pt is resting in bed with call bed in reach, side rails up x 4 and bed in lowest position.   K Dannilynn Gallina LPN

## 2017-08-01 NOTE — Progress Notes (Signed)
Patient was still very drowsy during the overnight but became more awake early this morning.

## 2017-08-01 NOTE — Progress Notes (Signed)
  Subjective/Complaints:  Patient has no complaints this morning.  She appears more alert.  She still answers yes/no questions most appropriately.   Objective: Vital Signs: Blood pressure (!) 151/70, pulse 76, temperature 99.6 F (37.6 C), temperature source Oral, resp. rate 20, height  (1.651 m), weight 210 lb 1.6 oz (95.3 kg), SpO2 98 %.   No acute distress. Chest clear to auscultation Cardiac exam S1-S2 regular Abdominal exam overweight, active bowel sounds.  She does have a G-tube site that is clean and dry. Extremities without edema.   Assessment/Plan: 1. Functional deficits secondary to Right hemiparesis , LLE paresisand dysarthria, Dysphagia and cognitive deficits related to Left periventricular and R paramedian sgital infarcts    Medical Problem List and Plan:  1. 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 DVT Prophylaxis/Anticoagulation: Pharmaceutical: Lovenox  q 24h 3. Chronic HA/Pain Management:  She currently denies pain. 4. Mood: LCSW to follow for evaluation and support.    Emotional lability trial of Cymbalta .Marland Kitchen See below..    If this is not helpful may consider SSRI 5. Neuropsych: This patient is not fully capable of making decisions on her own behalf. Appreciate input   6. Skin/Wound Care: routine pressure relief measures.  7. Fluids/Electrolytes/Nutrition: Monitor I/Os  8. 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 Monitor BS ac/hs. Educate on importance of compliance.  CBG (last 3)  Recent Labs    07/31/17 1645 07/31/17 2148 08/01/17 0628  GLUCAP 148* 164* 144*   Will increase mealtime insulin.  On 08/01/2017 9.  Dyslipidemia: On Crestor.  10. Anemia: Hgb stable at 9.5 on 5/1, improved to 10.6 on 5/14 11. HTN previously some concern with orthostasis.  Blood pressure still variable.95/40-151/70 we will continue current medications. Vitals:   08/01/17 0523 08/01/17 0531  BP: (!) 151/70 (!) 151/70  Pulse: 77 76  Resp: 20 20  Temp: 99.6 F (37.6 C) 99.6 F (37.6 C)  SpO2: 97% 98%   Currently has TEDs and abdominal binder    Continue to monitor-  12. Hypoalbuminemia  Supplement initiated on 5/4 13.  AMS probable seizure,CT neg forhemmorhagic transformation, EEG neg for seizure , slowing noted,  On Depakene, reviewed Neuro outpt note, possible seizure in 2017 , has also been treated for migraines per outpt Neuro Dr Everlena Cooper, added Keppra to Valproate depakene however the "seizure" may have been near syncope from orthostatic event 14.  Mild abd tenderness improved KUB neg 15.  Dysphagia moderately severe- supplement fluids with IV at noc  Had PEG placed yesterday.   Reviewed lab work from yesterday.  I suspect she had a component of dehydration.  She was bolused with fluids.  I will discontinue IV fluids, provide free water via G-tube check bmet in the morning.     LOS (Days) 19 A FACE TO FACE EVALUATION WAS PERFORMED  Bruce H Swords 08/01/2017, 9:50 AM

## 2017-08-01 NOTE — Plan of Care (Signed)
  Problem: Consults Goal: RH STROKE PATIENT EDUCATION Description See Patient Education module for education specifics  Outcome: Progressing   Problem: RH BOWEL ELIMINATION Goal: RH STG MANAGE BOWEL WITH ASSISTANCE Description STG Manage Bowel with mod Assistance.  Outcome: Progressing   Problem: RH BLADDER ELIMINATION Goal: RH STG MANAGE BLADDER WITH ASSISTANCE Description STG Manage Bladder With min. Assistance  Outcome: Progressing   Problem: RH SKIN INTEGRITY Goal: RH STG SKIN FREE OF INFECTION/BREAKDOWN Description With min. assist.  Outcome: Progressing Goal: RH STG MAINTAIN SKIN INTEGRITY WITH ASSISTANCE Description STG Maintain Skin Integrity With min. Assistance.  Outcome: Progressing   Problem: RH SAFETY Goal: RH STG ADHERE TO SAFETY PRECAUTIONS W/ASSISTANCE/DEVICE Description STG Adhere to Safety Precautions With mod.Assistance/Device.  Outcome: Progressing   Problem: RH PAIN MANAGEMENT Goal: RH STG PAIN MANAGED AT OR BELOW PT'S PAIN GOAL Description Less than 3.  Outcome: Progressing   Problem: RH KNOWLEDGE DEFICIT Goal: RH STG INCREASE KNOWLEDGE OF DIABETES Outcome: Progressing Goal: RH STG INCREASE KNOWLEDGE OF HYPERTENSION Outcome: Progressing Goal: RH STG INCREASE KNOWLEDGE OF DYSPHAGIA/FLUID INTAKE Outcome: Progressing   Problem: RH Other (Specify) Goal: RH LTG Other (Specify)1 Outcome: Progressing   

## 2017-08-01 NOTE — Progress Notes (Signed)
Occupational Therapy Session Note  Patient Details  Name: HENA EWALT MRN: [id number] Date of Birth: Dec 08, 1965  Today's Date: 08/01/2017 OT Individual Time:  - 60 minutes missed time    Skilled Therapeutic Interventions/Progress Updates:    Pt seen for scheduled OT. Pt grimacing and gesturing towards stomach/G tube. Verbalizing "can't burp." RN present and aware, premedicated pt for pain. OT provided her with options of two for tx with pt ultimately requesting to "rest" instead of getting OOB due to fatigue. 60 minutes missed.   Therapy Documentation Precautions:  Precautions Precautions: Fall Restrictions Weight Bearing Restrictions: No Vital Signs: Therapy Vitals Temp: 99.6 F (37.6 C) Temp Source: Oral Pulse Rate: 76 Resp: 20 BP: (!) 151/70 Patient Position (if appropriate): Sitting Oxygen Therapy SpO2: 98 % O2 Device: Room Air ADL:     See Function Navigator for Current Functional Status.   Therapy/Group: Individual Therapy  Jahred Tatar A Cerissa Zeiger 08/01/2017, 7:31 AM

## 2017-08-02 ENCOUNTER — Inpatient Hospital Stay (HOSPITAL_COMMUNITY): Payer: Medicaid Other | Admitting: Physical Therapy

## 2017-08-02 ENCOUNTER — Inpatient Hospital Stay (HOSPITAL_COMMUNITY): Payer: Medicaid Other

## 2017-08-02 ENCOUNTER — Inpatient Hospital Stay (HOSPITAL_COMMUNITY): Payer: Medicaid Other | Admitting: Occupational Therapy

## 2017-08-02 LAB — BASIC METABOLIC PANEL
Anion gap: 8 (ref 5–15)
BUN: 26 mg/dL — AB (ref 6–20)
CO2: 28 mmol/L (ref 22–32)
CREATININE: 1.15 mg/dL — AB (ref 0.44–1.00)
Calcium: 9 mg/dL (ref 8.9–10.3)
Chloride: 103 mmol/L (ref 101–111)
GFR calc Af Amer: >60 mL/min (ref >60)
GFR calc non Af Amer: 53 mL/min — ABNORMAL LOW (ref >60)
Glucose, Bld: 129 mg/dL — ABNORMAL HIGH (ref 65–99)
Potassium: 3.7 mmol/L (ref 3.5–5.1)
SODIUM: 139 mmol/L (ref 135–145)

## 2017-08-02 LAB — GLUCOSE, CAPILLARY
GLUCOSE-CAPILLARY: 135 mg/dL — AB (ref 65–99)
Glucose-Capillary: 169 mg/dL — ABNORMAL HIGH (ref 65–99)
Glucose-Capillary: 144 mg/dL — ABNORMAL HIGH (ref 65–99)
Glucose-Capillary: 155 mg/dL — ABNORMAL HIGH (ref 65–99)

## 2017-08-02 MED ORDER — BACITRACIN-NEOMYCIN-POLYMYXIN OINTMENT TUBE
TOPICAL_OINTMENT | Freq: Two times a day (BID) | CUTANEOUS | Status: DC
  Administered 2017-08-02 – 2017-08-05 (×7): via TOPICAL
  Filled 2017-08-02 (×2): qty 14

## 2017-08-02 MED ORDER — FREE WATER
250.0000 mL | Status: DC
  Administered 2017-08-02 – 2017-08-06 (×24): 250 mL

## 2017-08-02 MED ORDER — ESCITALOPRAM OXALATE 10 MG PO TABS
5.0000 mg | ORAL_TABLET | Freq: Every day | ORAL | Status: DC
  Administered 2017-08-02 – 2017-08-05 (×4): 5 mg via ORAL
  Filled 2017-08-02 (×4): qty 1

## 2017-08-02 NOTE — Plan of Care (Signed)
  Problem: Consults Goal: RH STROKE PATIENT EDUCATION Description See Patient Education module for education specifics  Outcome: Progressing   Problem: RH BOWEL ELIMINATION Goal: RH STG MANAGE BOWEL WITH ASSISTANCE Description STG Manage Bowel with mod Assistance.  Outcome: Progressing   Problem: RH BLADDER ELIMINATION Goal: RH STG MANAGE BLADDER WITH ASSISTANCE Description STG Manage Bladder With min. Assistance  Outcome: Progressing   Problem: RH SKIN INTEGRITY Goal: RH STG SKIN FREE OF INFECTION/BREAKDOWN Description With min. assist.  Outcome: Progressing Goal: RH STG MAINTAIN SKIN INTEGRITY WITH ASSISTANCE Description STG Maintain Skin Integrity With min. Assistance.  Outcome: Progressing   Problem: RH SAFETY Goal: RH STG ADHERE TO SAFETY PRECAUTIONS W/ASSISTANCE/DEVICE Description STG Adhere to Safety Precautions With mod.Assistance/Device.  Outcome: Progressing   Problem: RH PAIN MANAGEMENT Goal: RH STG PAIN MANAGED AT OR BELOW PT'S PAIN GOAL Description Less than 3.  Outcome: Progressing   Problem: RH KNOWLEDGE DEFICIT Goal: RH STG INCREASE KNOWLEDGE OF DIABETES Outcome: Progressing Goal: RH STG INCREASE KNOWLEDGE OF HYPERTENSION Outcome: Progressing Goal: RH STG INCREASE KNOWLEDGE OF DYSPHAGIA/FLUID INTAKE Outcome: Progressing   Problem: RH Other (Specify) Goal: RH LTG Other (Specify)1 Outcome: Progressing   

## 2017-08-02 NOTE — Progress Notes (Addendum)
Social Work Patient ID: Kelly Romero, female   DOB: 22-Mar-1965, 52 y.o.   MRN: [id number]  Team feels they need longer and more family education prior to discharge Dr. Kirtland Bouchard approved until Wed on Friday and then would see how she was doing upon his return tomorrow. Pt is having pain at the PEG site and not moving as well before the surgery according to therapy team, Dan-PA aware of this. See MD in am.

## 2017-08-02 NOTE — Progress Notes (Signed)
Occupational Therapy Session Note  Patient Details  Name: Kelly Romero MRN: [id number] Date of Birth: 20-Oct-1965  Today's Date: 08/02/2017 OT Missed Time: 23 Minutes Missed Time Reason: Patient fatigue  Short Term Goals: Week 3:  OT Short Term Goal 1 (Week 3): STG=LTG due to LOS  Skilled Therapeutic Interventions/Progress Updates:    Pt greeted semi-reclined in bed. Pt very lethargic and difficult to maintain alertness. Unable to get pt to participate in therapy 2/2 fatigue. Pt left semi-reclined in bed with alarm on and needs met. Informed nurse of pt status. Will follow up per plan of care.  Therapy Documentation Precautions:  Precautions Precautions: Fall Restrictions Weight Bearing Restrictions: No General: General OT Amount of Missed Time: 45 Minutes Pain: Pain Assessment Pain Score: 0-No pain  See Function Navigator for Current Functional Status.   Therapy/Group: Individual Therapy  Valma Cava 08/02/2017, 3:21 PM

## 2017-08-02 NOTE — Progress Notes (Signed)
Subjective/Complaints:  Pt having stomach pain near PEG site.   ROS: Limited due to cognitive/behavioral    Objective: Vital Signs: Blood pressure (!) 173/70, pulse 75, temperature 98.6 F (37 C), temperature source Oral, resp. rate 18, height '5\' 5"'$  (1.651 m), weight 95.3 kg (210 lb 1.6 oz), SpO2 98 %. Dg Chest 2 View  Result Date: 07/31/2017 CLINICAL DATA:  Abnormal respirations. EXAM: CHEST - 2 VIEW COMPARISON:  04/26/2017 FINDINGS: Cardiomediastinal silhouette is unchanged and unremarkable. A loop recording device is noted overlying the LEFT chest. Mild bibasilar atelectasis identified. There is no evidence of focal airspace disease, pulmonary edema, suspicious pulmonary nodule/mass, pleural effusion, or pneumothorax. No acute bony abnormalities are identified. IMPRESSION: Mild bibasilar atelectasis. Electronically Signed   By: Margarette Canada M.D.   On: 07/31/2017 16:08   Ct Head Wo Contrast  Result Date: 07/31/2017 CLINICAL DATA:  Altered mental status EXAM: CT HEAD WITHOUT CONTRAST TECHNIQUE: Contiguous axial images were obtained from the base of the skull through the vertex without intravenous contrast. COMPARISON:  07/19/2017 FINDINGS: Brain: Remote left inferior and parasagittal occipital infarcts. Subacute ischemia with progressively well-defined borders in the left corona radiata. There were also acute parasagittal right frontal lobe infarcts on the prior MRI, difficult to resolve by CT due to small size. Remote small vessel infarcts in the bilateral corona radiata and left posterior thalamus. No hemorrhagic conversion or evidence of interval infarct. No hydrocephalus or collection. Vascular: Atherosclerotic calcification.  No hyperdense vessel. Skull: No acute finding Sinuses/Orbits: Bilateral cataract resection. IMPRESSION: 1. No acute finding. 2. Extensive primarily small-vessel ischemic injury without visible progression since 07/11/2017 brain MRI. Electronically Signed   By: Monte Fantasia M.D.   On: 07/31/2017 16:20   Results for orders placed or performed during the hospital encounter of 07/13/17 (from the past 72 hour(s))  Glucose, capillary     Status: Abnormal   Collection Time: 07/30/17  5:23 PM  Result Value Ref Range   Glucose-Capillary 150 (H) 65 - 99 mg/dL  Glucose, capillary     Status: Abnormal   Collection Time: 07/30/17  9:24 PM  Result Value Ref Range   Glucose-Capillary 156 (H) 65 - 99 mg/dL  Glucose, capillary     Status: Abnormal   Collection Time: 07/31/17  6:40 AM  Result Value Ref Range   Glucose-Capillary 198 (H) 65 - 99 mg/dL  Glucose, capillary     Status: Abnormal   Collection Time: 07/31/17 11:44 AM  Result Value Ref Range   Glucose-Capillary 199 (H) 65 - 99 mg/dL  CBC with Differential/Platelet     Status: Abnormal   Collection Time: 07/31/17  2:35 PM  Result Value Ref Range   WBC 8.4 4.0 - 10.5 K/uL   RBC 3.50 (L) 3.87 - 5.11 MIL/uL   Hemoglobin 9.8 (L) 12.0 - 15.0 g/dL   HCT 31.5 (L) 36.0 - 46.0 %   MCV 90.0 78.0 - 100.0 fL   MCH 28.0 26.0 - 34.0 pg   MCHC 31.1 30.0 - 36.0 g/dL   RDW 13.1 11.5 - 15.5 %   Platelets 216 150 - 400 K/uL   Neutrophils Relative % 76 %   Neutro Abs 6.3 1.7 - 7.7 K/uL   Lymphocytes Relative 14 %   Lymphs Abs 1.2 0.7 - 4.0 K/uL   Monocytes Relative 9 %   Monocytes Absolute 0.8 0.1 - 1.0 K/uL   Eosinophils Relative 0 %   Eosinophils Absolute 0.0 0.0 - 0.7 K/uL   Basophils Relative 0 %  Basophils Absolute 0.0 0.0 - 0.1 K/uL   Immature Granulocytes 1 %   Abs Immature Granulocytes 0.1 0.0 - 0.1 K/uL    Comment: Performed at [facility], [place] [address] [place], [place] [address]  Basic metabolic panel     Status: Abnormal   Collection Time: 07/31/17  2:35 PM  Result Value Ref Range   Sodium 140 135 - 145 mmol/L   Potassium 3.8 3.5 - 5.1 mmol/L   Chloride 106 101 - 111 mmol/L   CO2 25 22 - 32 mmol/L   Glucose, Bld 188 (H) 65 - 99 mg/dL   BUN 21 (H) 6 - 20 mg/dL   Creatinine, Ser 1.42 (H)  0.44 - 1.00 mg/dL   Calcium 9.0 8.9 - 10.3 mg/dL   GFR calc non Af Amer 41 (L) >60 mL/min   GFR calc Af Amer 48 (L) >60 mL/min    Comment: (NOTE) The eGFR has been calculated using the CKD EPI equation. This calculation has not been validated in all clinical situations. eGFR's persistently <60 mL/min signify possible Chronic Kidney Disease.    Anion gap 9 5 - 15    Comment: Performed at [facility] [address] [place], [place] [address]  Lactic acid, plasma     Status: None   Collection Time: 07/31/17  2:40 PM  Result Value Ref Range   Lactic Acid, Venous 1.0 0.5 - 1.9 mmol/L    Comment: Performed at [facility] [address], [place], [place] [address]  Culture, blood (single)     Status: None (Preliminary result)   Collection Time: 07/31/17  2:40 PM  Result Value Ref Range   Specimen Description BLOOD LEFT ANTECUBITAL    Special Requests      BOTTLES DRAWN AEROBIC ONLY Blood Culture adequate volume   Culture      NO GROWTH < 24 HOURS Performed at [facility] [address] [place],  [address]    Report Status PENDING   Glucose, capillary     Status: Abnormal   Collection Time: 07/31/17  4:45 PM  Result Value Ref Range   Glucose-Capillary 148 (H) 65 - 99 mg/dL  Glucose, capillary     Status: Abnormal   Collection Time: 07/31/17  9:48 PM  Result Value Ref Range   Glucose-Capillary 164 (H) 65 - 99 mg/dL  Glucose, capillary     Status: Abnormal   Collection Time: 08/01/17  6:28 AM  Result Value Ref Range   Glucose-Capillary 144 (H) 65 - 99 mg/dL  Glucose, capillary     Status: Abnormal   Collection Time: 08/01/17 11:55 AM  Result Value Ref Range   Glucose-Capillary 171 (H) 65 - 99 mg/dL  Glucose, capillary     Status: Abnormal   Collection Time: 08/01/17  4:59 PM  Result Value Ref Range   Glucose-Capillary 141 (H) 65 - 99 mg/dL  Glucose, capillary     Status: Abnormal   Collection Time: 08/01/17 10:01 PM  Result Value Ref Range    Glucose-Capillary 143 (H) 65 - 99 mg/dL  Basic metabolic panel     Status: Abnormal   Collection Time: 08/02/17  5:38 AM  Result Value Ref Range   Sodium 139 135 - 145 mmol/L   Potassium 3.7 3.5 - 5.1 mmol/L   Chloride 103 101 - 111 mmol/L   CO2 28 22 - 32 mmol/L   Glucose, Bld 129 (H) 65 - 99 mg/dL   BUN  26 (H) 6 - 20 mg/dL   Creatinine, Ser 1.15 (H) 0.44 - 1.00 mg/dL   Calcium 9.0 8.9 - 10.3 mg/dL   GFR calc non Af Amer 53 (L) >60 mL/min   GFR calc Af Amer >60 >60 mL/min    Comment: (NOTE) The eGFR has been calculated using the CKD EPI equation. This calculation has not been validated in all clinical situations. eGFR's persistently <60 mL/min signify possible Chronic Kidney Disease.    Anion gap 8 5 - 15    Comment: Performed at [facility] [address] [place], [place] [address]  Glucose, capillary     Status: Abnormal   Collection Time: 08/02/17  6:43 AM  Result Value Ref Range   Glucose-Capillary 135 (H) 65 - 99 mg/dL  Glucose, capillary     Status: Abnormal   Collection Time: 08/02/17 12:03 PM  Result Value Ref Range   Glucose-Capillary 169 (H) 65 - 99 mg/dL     Constitutional:  VS reviewed HEENT: EOMI, oral membranes moist Neck: supple Cardiovascular: RRR without murmur. No JVD    Respiratory: CTA Bilaterally without wheezes or rales. Normal effort    GI:  abd tender near PEG. PEG site intact  Musc: No edema or tenderness in extremities. Skin:   Intact. Warm and dry.    Neuro:  Alert Right facial droop without changes Alert/ oriented to person not time Motor: 4/5 RUE proximal to distal- stable 4+/5 LUE proximal to distal- stable RLE: 3-/5 HF, KE, ADF, 4/5 (essentially the same) LLE: 3-/5 HF, KE, ADF stable Dysarthria Psych: somewhat anxious, depressed appearing    Assessment/Plan: 1. 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 Physiatrist is providing close team supervision and 24 hour management of active medical problems listed below. Physiatrist and rehab team continue to assess barriers to discharge/monitor patient progress toward functional and medical goals. FIM: Function - Bathing Bathing activity did not occur: Refused Position: Shower Body parts bathed by patient: Chest, Abdomen, Front perineal area, Right upper leg, Left upper leg, Right arm, Left arm Body parts bathed by helper: Right arm, Left arm, Chest, Abdomen, Front perineal area, Buttocks, Right upper leg, Left upper leg, Right lower leg, Left lower leg, Back Assist Level: 2 helpers  Function- Upper Body Dressing/Undressing What is the patient wearing?: Hospital gown Pull over shirt/dress - Perfomed by patient: Thread/unthread left sleeve, Put head through opening, Pull shirt over trunk, Thread/unthread right sleeve Pull over shirt/dress - Perfomed by helper: Thread/unthread right sleeve Assist Level: Touching or steadying assistance(Pt > 75%) Function - Lower Body Dressing/Undressing What is the patient wearing?: Hospital Gown Position: Sitting EOB Underwear - Performed by helper: Thread/unthread right underwear leg, Thread/unthread left underwear leg, Pull underwear up/down Pants- Performed by patient: Thread/unthread right pants leg, Thread/unthread left pants leg Pants- Performed by helper: Pull pants up/down Non-skid slipper socks- Performed by helper: Don/doff right sock, Don/doff left sock Socks - Performed by helper: Don/doff right sock, Don/doff left sock Shoes - Performed by patient: Don/doff right shoe, Fasten right Shoes - Performed by helper: Don/doff left shoe, Fasten left, Don/doff right shoe, Fasten right TED Hose - Performed by helper: Don/doff right TED hose, Don/doff left TED hose Assist for footwear:  Partial/moderate assist Assist for lower body dressing: 2 Helpers  Function - Toileting Toileting activity did  not occur: No continent bowel/bladder event Toileting steps completed by patient: Adjust clothing prior to toileting, Performs perineal hygiene Toileting steps completed by helper: Adjust clothing prior to toileting, Performs perineal hygiene, Adjust clothing after toileting Toileting Assistive Devices: Grab bar or rail Assist level: Two helpers  Function - Air cabin crew transfer activity did not occur: Safety/medical concerns Toilet transfer assistive device: Bedside commode Assist level to toilet: Touching or steadying assistance (Pt > 75%) Assist level from toilet: Touching or steadying assistance (Pt > 75%) Assist level to bedside commode (at bedside): Moderate assist (Pt 50 - 74%/lift or lower) Assist level from bedside commode (at bedside): Moderate assist (Pt 50 - 74%/lift or lower)  Function - Chair/bed transfer Chair/bed transfer method: Stand pivot Chair/bed transfer assist level: Touching or steadying assistance (Pt > 75%) Chair/bed transfer assistive device: Walker, Armrests Chair/bed transfer details: Tactile cues for posture, Verbal cues for technique, Verbal cues for sequencing  Function - Locomotion: Wheelchair Will patient use wheelchair at discharge?: No Type: Manual Wheelchair activity did not occur: Safety/medical concerns Max wheelchair distance: 50 Assist Level: Touching or steadying assistance (Pt > 75%) Wheel 50 feet with 2 turns activity did not occur: Safety/medical concerns Assist Level: Touching or steadying assistance (Pt > 75%) Wheel 150 feet activity did not occur: Safety/medical concerns Function - Locomotion: Ambulation Assistive device: Walker-rolling Max distance: 10 Assist level: Touching or steadying assistance (Pt > 75%) Assist level: Touching or steadying assistance (Pt > 75%) Assist level: Touching or steadying  assistance (Pt > 75%) Walk 150 feet activity did not occur: Safety/medical concerns Assist level: Moderate assist (Pt 50 - 74%) Assist level: Moderate assist (Pt 50 - 74%)  Function - Comprehension Comprehension: Auditory Comprehension assist level: Understands basic 75 - 89% of the time/ requires cueing 10 - 24% of the time  Function - Expression Expression: Verbal Expression assist level: Expresses basic 25 - 49% of the time/requires cueing 50 - 75% of the time. Uses single words/gestures.  Function - Social Interaction Social Interaction assist level: Interacts appropriately 25 - 49% of time - Needs frequent redirection.  Function - Problem Solving Problem solving assist level: Solves basic 75 - 89% of the time/requires cueing 10 - 24% of the time  Function - Memory Memory assist level: More than reasonable amount of time, Recognizes or recalls 25 - 49% of the time/requires cueing 50 - 75% of the time Patient normally able to recall (first 3 days only): Current season, That he or she is in a hospital, Location of own room   Medical Problem List and Plan:  1. Right hemiparesis and functional deficits secondary to left periventricular white matter and right frontal lobe infarcts    Cont CIR -  PT, OT, SLP standing tolerance is poor, ambulation goals may not be practical , limit standing to transfers, dressing in bed to avoid hypotension  -pt with ?more weakness later today after I saw her. May be related to pain from PEG placement. CT scan from 5/18 without changes. Anxiety component and she is a little volume depleted.  2. DVT Prophylaxis/Anticoagulation: Pharmaceutical: Lovenox '40mg'$  q 24h 3. Chronic HA/Pain Management: Depakote bid with tylenol prn.  Lower extremity paresthesias  Trial of Cymbalta 4. Mood: LCSW to follow for evaluation and support.    Emotional lability ---off of cymbalta, on depakote '500mg'$  bid  -add lexapro low dose, '5mg'$  qhs    I 5. Neuropsych: This patient is  not fully capable of making decisions on her own behalf. Appreciate  input    6. Skin/Wound Care: routine pressure relief measures.  7. Fluids/Electrolytes/Nutrition: Monitor I/Os   -increase fluids through PEG as BUN trending up 8. 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 Monitor BS ac/hs. Educate on importance of compliance.  CBG (last 3)  Recent Labs    08/01/17 2201 08/02/17 0643 08/02/17 1203  GLUCAP 143* 135* 169*    reasonably controlled on 5/20 9. Dyslipidemia: On Crestor.  10. Anemia: Hgb stable at 9.5 on 5/1, 9.8 on 5/18 11. HTN with orthostasis-    Monitor BP bid.   -no orthostasis today  -lisinopril is '10mg'$  bid,,  measure BPs in sitting position not while in bed.  Appreciate card input Per cards not a good candidate for midodrine or florinef   pt and family aware that this is most likely an autonomic neuropathy and will be managed but not "fixed"   -added low dose metoprolol 12.'5mg'$  5/11 with some improvement Lying BPs need to be high to compensate for orthostatic drops Vitals:   08/02/17 0435 08/02/17 0840  BP: (!) 158/74 (!) 173/70  Pulse: 75 75  Resp: 18   Temp: 98.6 F (37 C)   SpO2: 98%    Ordered TEDs  Abdominal binder    Continue to monitor-   -increase H2O flushes thru PEG to 250cc q4hrs for now 12. Hypoalbuminemia  Supplement initiated on 5/4 13.  AMS probable seizure,CT neg forhemmorhagic transformation, EEG neg for seizure , slowing noted,  On Depakene, reviewed Neuro outpt note, possible seizure in 2017 , has also been treated for migraines per outpt Neuro Dr Tomi Likens, added Keppra to Valproate depakene however the "seizure" may have been near syncope from orthostatic event 14.  Mild abd tenderness improved KUB neg 15.  Dysphagia moderately severe-   -PEG placed 5/17  -local care to PEG site       LOS (Days) 20 A FACE TO FACE EVALUATION WAS PERFORMED  Meredith Staggers 08/02/2017, 12:40 PM

## 2017-08-02 NOTE — Progress Notes (Signed)
Occupational Therapy Session Note  Patient Details  Name: RHIANNAN KIEVIT MRN: [id number] Date of Birth: 1965-10-28  Today's Date: 08/02/2017 OT Individual Time: 0919-1000 OT Individual Time Calculation (min): 41 min   Skilled Therapeutic Interventions/Progress Updates:    Pt greeted supine in bed. When asked if she felt up for therapy, pt verbalized "let's do it" and smiled. Plan was to dress EOB with pt picking out her clothes, verbalizing "blue" shirt and "jeans." Pt rolled Rt>Lt for brief change first, requiring Mod A towards Rt and Max-Total towards Lt. Pt was incontinent of bowel movement, and continued to have BMs after perineal hygiene was performed. Her son in law was present, and had hands on practice/education during toileting care. Pt actively assisting with elevating L LE for helpers to complete hygiene thoroughly when she was sidelying. At times she was teary/crying but did not state why. Due to time constraints, pt donned clean hospital gown and required 2 helpers for boosting up in bed/repositioning. Pt left with all needs, bed alarm set, and son in law present.    Therapy Documentation Precautions:  Precautions Precautions: Fall Restrictions Weight Bearing Restrictions: No ADL:      See Function Navigator for Current Functional Status.   Therapy/Group: Individual Therapy  Jerman Tinnon A Elsey Holts 08/02/2017, 12:43 PM

## 2017-08-02 NOTE — Progress Notes (Signed)
Speech Language Pathology Daily Session Note  Patient Details  Name: Kelly Romero MRN: [id number] Date of Birth: Jul 18, 1965  Today's Date: 08/02/2017 SLP Individual Time: 1100-1200 SLP Individual Time Calculation (min): 60 min  Short Term Goals: Week 3: SLP Short Term Goal 1 (Week 3): Pt will utilize word finding strategies to convey basic thoughts and ideas with supervision A cues.  SLP Short Term Goal 2 (Week 3): Pt will utilize speech intelligibility strategies with Mod A  to increase speech intelligibility to ~ 90% at the phrase level.  SLP Short Term Goal 3 (Week 3): Pt will solve semi-complex problem solving tasks with Mod A cues.  SLP Short Term Goal 4 (Week 3): Pt will demonstrate selective attention in moderately distracting environment for 30 minutes with Min A cues.  SLP Short Term Goal 5 (Week 3): Pt will consume puree with honey thick liquids and minimal s/s of aspiration and Min A for useof compensatory swallow strategies.   Skilled Therapeutic Interventions:Skilled ST services focused on speech and swallow skills. SLP facilitated word finding skills to communicate basic thought requiring Max A verbal cues at phrase level and Mod A for speech intelligibility, suggesting a dramatic change from last week, piror to PEG placement. Pt's Son-in-law was present in session and.cofrimed changes in word finding, processing and attention over the weekend. Swollen right hand and right facial droop was noted as well. SLP communicated with physical therapist about physical and cognitive changes, who notified the PA.Marland Kitchen Pt required mod A increase to max A verbal cues in structured word finding task to name opposites at basic level, likely due to fagitue. Pt required max A verbal cues every 5 minutes increasing to every 2 minutes as the session continued. SLP facilitated basic problem solving with money management, pt demonstrated ability to name coins with mod-min A verbal cues and add two coin  together with max A verbal cues. SLP facilitated PO consumption of dys 1 and HTL, pt demonstrated oral holding of 12 seconds given max A multimodal cues decrease time to 7 seconds with no overt aspiration and minimal PO intake. Due to perceived cognitive and physical changes, SLP does not recommend discharge at this time and further investigation of changes in skill set. Pt was left in room with call bell within reach. Recommend to continue skilled ST services.       Function:  Eating Eating   Modified Consistency Diet: Yes Eating Assist Level: Set up assist for;More than reasonable amount of time;Supervision or verbal cues;Helper brings food to mouth   Eating Set Up Assist For: Opening containers   Helper Brings Food to Mouth: Occasionally   Cognition Comprehension Comprehension assist level: Understands basic 50 - 74% of the time/ requires cueing 25 - 49% of the time  Expression   Expression assist level: Expresses basic 25 - 49% of the time/requires cueing 50 - 75% of the time. Uses single words/gestures.  Social Interaction Social Interaction assist level: Interacts appropriately 25 - 49% of time - Needs frequent redirection.  Problem Solving Problem solving assist level: Solves basic 25 - 49% of the time - needs direction more than half the time to initiate, plan or complete simple activities  Memory Memory assist level: More than reasonable amount of time;Recognizes or recalls 25 - 49% of the time/requires cueing 50 - 75% of the time    Pain Pain Assessment Pain Score: 0-No pain  Therapy/Group: Individual Therapy  Kelly Romero  [facility] 08/02/2017, 1:58 PM

## 2017-08-02 NOTE — Progress Notes (Addendum)
Physical Therapy Session Note  Patient Details  Name: Kelly Romero MRN: [id number] Date of Birth: 09-12-1965  Today's Date: 08/02/2017 PT Individual Time: 0805-0900 PT Individual Time Calculation (min): 55 min   Short Term Goals: Week 3:  PT Short Term Goal 1 (Week 3): STG = LTG due to estimated d/c date.  Skilled Therapeutic Interventions/Progress Updates:  Pt received in bed & agreeable to tx. Pt reporting "I'm sick" and grimacing in pain while holding stomach - RN made aware & reports pt is premedicated & recently received suppository. Pt appears to be more weak in RUE with only slight shoulder activation noted - PA Jesusita Oka) made aware about pt's decline since this therapist saw her on Thursday (5/16). Pt reported having incontinent BM but pt actually had not had BM. Therapist donned thigh high ted hose total assist and BP assessed, see below. Pt requesting to get out of bed but then very tearful and unable to state why. Pt's breakfast arrived and therapist provided supervision while pt consumed breakfast. Therapist provided total assist for preparing food with condiments and opening containers. Pt consumed breakfast with LUE demonstrating apraxia when moving drink to mouth & therapist provided assistance for bringing cup all the way to her mouth and turning it. Pt required max encouragement to consume liquids & pt only consumed 2 sips. Pt required cuing to clean outside of mouth on R side 2/2 oral residue. Pt only ate half of grits before reporting she was full. At end of session pt left in bed with alarm set & all needs within reach. No family was present during session. LCSW [facility]) made aware of family's need for more extensive caregiver training prior to pt's d/c as pt is not safe to d/c home at this time. Caregivers will need to be trained on peri hygiene, dressing, bathing, transfers, bed mobility, and especially car transfer as pt's family recently bought an SUV and pt may not be safe to  transfer into car 2/2 BP issues.  Therapy Documentation Precautions:  Precautions Precautions: Fall Restrictions Weight Bearing Restrictions: No  Vital Signs: Therapy Vitals Pulse Rate: 75 BP: (!) 173/70 Patient Position (if appropriate): Lying  See Function Navigator for Current Functional Status.   Therapy/Group: Individual Therapy  Sandi Mariscal 08/02/2017, 9:20 AM

## 2017-08-03 ENCOUNTER — Inpatient Hospital Stay (HOSPITAL_COMMUNITY): Payer: Medicaid Other | Admitting: Physical Therapy

## 2017-08-03 ENCOUNTER — Inpatient Hospital Stay (HOSPITAL_COMMUNITY): Payer: Medicaid Other

## 2017-08-03 ENCOUNTER — Inpatient Hospital Stay (HOSPITAL_COMMUNITY): Payer: Medicaid Other | Admitting: Occupational Therapy

## 2017-08-03 ENCOUNTER — Ambulatory Visit (HOSPITAL_COMMUNITY): Payer: Medicaid Other

## 2017-08-03 LAB — BASIC METABOLIC PANEL
Anion gap: 7 (ref 5–15)
BUN: 21 mg/dL — AB (ref 6–20)
CALCIUM: 9.1 mg/dL (ref 8.9–10.3)
CO2: 32 mmol/L (ref 22–32)
Chloride: 102 mmol/L (ref 101–111)
Creatinine, Ser: 1.06 mg/dL — ABNORMAL HIGH (ref 0.44–1.00)
GFR calc Af Amer: >60 mL/min (ref >60)
GFR, EST NON AFRICAN AMERICAN: 59 mL/min — AB (ref >60)
GLUCOSE: 165 mg/dL — AB (ref 65–99)
Potassium: 3.5 mmol/L (ref 3.5–5.1)
Sodium: 141 mmol/L (ref 135–145)

## 2017-08-03 LAB — CBC
HCT: 31.6 % — ABNORMAL LOW (ref 36.0–46.0)
Hemoglobin: 9.8 g/dL — ABNORMAL LOW (ref 12.0–15.0)
MCH: 27.9 pg (ref 26.0–34.0)
MCHC: 31 g/dL (ref 30.0–36.0)
MCV: 90 fL (ref 78.0–100.0)
Platelets: 244 10*3/uL (ref 150–400)
RBC: 3.51 MIL/uL — ABNORMAL LOW (ref 3.87–5.11)
RDW: 13.1 % (ref 11.5–15.5)
WBC: 5 10*3/uL (ref 4.0–10.5)

## 2017-08-03 LAB — GLUCOSE, CAPILLARY
GLUCOSE-CAPILLARY: 150 mg/dL — AB (ref 65–99)
GLUCOSE-CAPILLARY: 215 mg/dL — AB (ref 65–99)
Glucose-Capillary: 137 mg/dL — ABNORMAL HIGH (ref 65–99)
Glucose-Capillary: 135 mg/dL — ABNORMAL HIGH (ref 65–99)

## 2017-08-03 MED ORDER — CLOPIDOGREL BISULFATE 75 MG PO TABS
75.0000 mg | ORAL_TABLET | Freq: Every day | ORAL | Status: DC
  Administered 2017-08-03 – 2017-08-06 (×4): 75 mg via ORAL
  Filled 2017-08-03 (×5): qty 1

## 2017-08-03 NOTE — Progress Notes (Signed)
Social Work Patient ID: Kelly Romero, female   DOB: 1965/07/13, 52 y.o.   MRN: [id number]  Met with pt, son in-law and daughter to discuss needs and the amount of care pt will require at home. Family wants to take her home and provide the care. They are here to learn her care and team needs to being hoyer lift training with them. Pt looks better today and reports feels somewhat better. Will discuss in team conference tomorrow to set new discharge date. Will update family when meeting over.

## 2017-08-03 NOTE — Plan of Care (Signed)
  Problem: Consults Goal: RH STROKE PATIENT EDUCATION Description See Patient Education module for education specifics  Outcome: Progressing   Problem: RH BOWEL ELIMINATION Goal: RH STG MANAGE BOWEL WITH ASSISTANCE Description STG Manage Bowel with mod Assistance.  Outcome: Progressing   Problem: RH BLADDER ELIMINATION Goal: RH STG MANAGE BLADDER WITH ASSISTANCE Description STG Manage Bladder With min. Assistance  Outcome: Progressing   Problem: RH SKIN INTEGRITY Goal: RH STG SKIN FREE OF INFECTION/BREAKDOWN Description With min. assist.  Outcome: Progressing Goal: RH STG MAINTAIN SKIN INTEGRITY WITH ASSISTANCE Description STG Maintain Skin Integrity With min. Assistance.  Outcome: Progressing   Problem: RH SAFETY Goal: RH STG ADHERE TO SAFETY PRECAUTIONS W/ASSISTANCE/DEVICE Description STG Adhere to Safety Precautions With mod.Assistance/Device.  Outcome: Progressing   Problem: RH PAIN MANAGEMENT Goal: RH STG PAIN MANAGED AT OR BELOW PT'S PAIN GOAL Description Less than 3.  Outcome: Progressing   Problem: RH KNOWLEDGE DEFICIT Goal: RH STG INCREASE KNOWLEDGE OF DIABETES Outcome: Progressing Goal: RH STG INCREASE KNOWLEDGE OF HYPERTENSION Outcome: Progressing Goal: RH STG INCREASE KNOWLEDGE OF DYSPHAGIA/FLUID INTAKE Outcome: Progressing   Problem: RH Other (Specify) Goal: RH LTG Other (Specify)1 Outcome: Progressing   

## 2017-08-03 NOTE — Progress Notes (Signed)
Subjective/Complaints:  Patient is not complaining of abdominal pain. Reviewed chart since last visit on 07/31/2017.  Patient has undergone PEG placement.  Postprocedure noted to have some increased weakness in the right arm.  Repeat CT scan performed 07/31/2017 showed no acute changes. Per therapy, increased muscle weakness in the right upper with the right lower limb sitting balance starting to lean more toward the left side Continues to have severe dysarthria as well as moderate a aphasia. ROS: Limited due to cognitive/behavioral    Objective: Vital Signs: Blood pressure (!) 157/75, pulse 73, temperature 98.4 F (36.9 C), temperature source Oral, resp. rate 17, height _0  (1.651 m), weight 95.3 kg (210 lb 1.6 oz), SpO2 100 %. No results found. Results for orders placed or performed during the hospital encounter of 07/13/17 (from the past 72 hour(s))  Glucose, capillary     Status: Abnormal   Collection Time: 07/31/17  4:45 PM  Result Value Ref Range   Glucose-Capillary 148 (H) 65 - 99 mg/dL  Glucose, capillary     Status: Abnormal   Collection Time: 07/31/17  9:48 PM  Result Value Ref Range   Glucose-Capillary 164 (H) 65 - 99 mg/dL  Glucose, capillary     Status: Abnormal   Collection Time: 08/01/17  6:28 AM  Result Value Ref Range   Glucose-Capillary 144 (H) 65 - 99 mg/dL  Glucose, capillary     Status: Abnormal   Collection Time: 08/01/17 11:55 AM  Result Value Ref Range   Glucose-Capillary 171 (H) 65 - 99 mg/dL  Glucose, capillary     Status: Abnormal   Collection Time: 08/01/17  4:59 PM  Result Value Ref Range   Glucose-Capillary 141 (H) 65 - 99 mg/dL  Glucose, capillary     Status: Abnormal   Collection Time: 08/01/17 10:01 PM  Result Value Ref Range   Glucose-Capillary 143 (H) 65 - 99 mg/dL  Basic metabolic panel     Status: Abnormal   Collection Time: 08/02/17  5:38 AM  Result Value Ref Range   Sodium 139 135 - 145 mmol/L   Potassium 3.7 3.5 - 5.1 mmol/L    Chloride 103 101 - 111 mmol/L   CO2 28 22 - 32 mmol/L   Glucose, Bld 129 (H) 65 - 99 mg/dL   BUN 26 (H) 6 - 20 mg/dL   Creatinine, Ser 1.15 (H) 0.44 - 1.00 mg/dL   Calcium 9.0 8.9 - 10.3 mg/dL   GFR calc non Af Amer 53 (L) >60 mL/min   GFR calc Af Amer >60 >60 mL/min    Comment: (NOTE) The eGFR has been calculated using the CKD EPI equation. This calculation has not been validated in all clinical situations. eGFR's persistently <60 mL/min signify possible Chronic Kidney Disease.    Anion gap 8 5 - 15    Comment: Performed at [facility] [address], [place], [place] [address]  Glucose, capillary     Status: Abnormal   Collection Time: 08/02/17  6:43 AM  Result Value Ref Range   Glucose-Capillary 135 (H) 65 - 99 mg/dL  Glucose, capillary     Status: Abnormal   Collection Time: 08/02/17 12:03 PM  Result Value Ref Range   Glucose-Capillary 169 (H) 65 - 99 mg/dL  Glucose, capillary     Status: Abnormal   Collection Time: 08/02/17  5:11 PM  Result Value Ref Range   Glucose-Capillary 144 (H) 65 - 99 mg/dL  Glucose, capillary     Status: Abnormal  Collection Time: 08/02/17  9:55 PM  Result Value Ref Range   Glucose-Capillary 155 (H) 65 - 99 mg/dL  Basic metabolic panel     Status: Abnormal   Collection Time: 08/03/17  6:54 AM  Result Value Ref Range   Sodium 141 135 - 145 mmol/L   Potassium 3.5 3.5 - 5.1 mmol/L   Chloride 102 101 - 111 mmol/L   CO2 32 22 - 32 mmol/L   Glucose, Bld 165 (H) 65 - 99 mg/dL   BUN 21 (H) 6 - 20 mg/dL   Creatinine, Ser 1.06 (H) 0.44 - 1.00 mg/dL   Calcium 9.1 8.9 - 10.3 mg/dL   GFR calc non Af Amer 59 (L) >60 mL/min   GFR calc Af Amer >60 >60 mL/min    Comment: (NOTE) The eGFR has been calculated using the CKD EPI equation. This calculation has not been validated in all clinical situations. eGFR's persistently <60 mL/min signify possible Chronic Kidney Disease.    Anion gap 7 5 - 15    Comment: Performed at [facility] [address] [place], [place] [address]  CBC     Status: Abnormal   Collection Time: 08/03/17  6:54 AM  Result Value Ref Range   WBC 5.0 4.0 - 10.5 K/uL   RBC 3.51 (L) 3.87 - 5.11 MIL/uL   Hemoglobin 9.8 (L) 12.0 - 15.0 g/dL   HCT 31.6 (L) 36.0 - 46.0 %   MCV 90.0 78.0 - 100.0 fL   MCH 27.9 26.0 - 34.0 pg   MCHC 31.0 30.0 - 36.0 g/dL   RDW 13.1 11.5 - 15.5 %   Platelets 244 150 - 400 K/uL    Comment: Performed at [facility], [place] [address], [place], [place] [address]  Glucose, capillary     Status: Abnormal   Collection Time: 08/03/17  6:58 AM  Result Value Ref Range   Glucose-Capillary 150 (H) 65 - 99 mg/dL  Glucose, capillary     Status: Abnormal   Collection Time: 08/03/17 11:56 AM  Result Value Ref Range   Glucose-Capillary 215 (H) 65 - 99 mg/dL     Constitutional:  VS reviewed HEENT: EOMI, oral membranes moist Neck: supple Cardiovascular: RRR without murmur. No JVD    Respiratory: CTA Bilaterally without wheezes or rales. Normal effort    GI:  abd tender near PEG. PEG site intact  Musc: No edema or tenderness in extremities. Skin:   Intact. Warm and dry.    Neuro:  Alert Right facial droop without changes Alert/ oriented to person not time Motor: 3-5 RUE proximal to distal-compared to 4/5 prior to PEG 4+/5 LUE proximal to distal- stable RLE: 3-/5 HF, KE, ADF, 4/5 (essentially the same) LLE: 3-/5 HF, KE, ADF stable Dysarthria Psych: somewhat anxious, depressed appearing    Assessment/Plan: 1. 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 Physiatrist is providing close team supervision and 24 hour management of active medical problems listed below. Physiatrist and rehab team continue to assess barriers to discharge/monitor patient  progress toward functional and medical goals. FIM: Function - Bathing Bathing activity did not occur: Refused Position: Shower Body parts bathed by patient: Chest, Abdomen, Front perineal area, Right upper leg, Left upper leg, Right arm, Left arm Body parts bathed by helper: Right arm, Left arm,  Chest, Abdomen, Front perineal area, Buttocks, Right upper leg, Left upper leg, Right lower leg, Left lower leg, Back Assist Level: 2 helpers  Function- Upper Body Dressing/Undressing What is the patient wearing?: Pull over shirt/dress Pull over shirt/dress - Perfomed by patient: Thread/unthread right sleeve, Thread/unthread left sleeve, Pull shirt over trunk, Put head through opening Pull over shirt/dress - Perfomed by helper: Thread/unthread right sleeve Assist Level: Touching or steadying assistance(Pt > 75%) Function - Lower Body Dressing/Undressing What is the patient wearing?: Pants, Non-skid slipper socks, Ted Hose Position: Sitting EOB Underwear - Performed by helper: Thread/unthread right underwear leg, Thread/unthread left underwear leg, Pull underwear up/down Pants- Performed by patient: Thread/unthread right pants leg, Thread/unthread left pants leg Pants- Performed by helper: Thread/unthread right pants leg, Thread/unthread left pants leg Non-skid slipper socks- Performed by helper: Don/doff right sock, Don/doff left sock Socks - Performed by helper: Don/doff right sock, Don/doff left sock Shoes - Performed by patient: Don/doff right shoe, Fasten right Shoes - Performed by helper: Don/doff left shoe, Fasten left, Don/doff right shoe, Fasten right TED Hose - Performed by helper: Don/doff right TED hose, Don/doff left TED hose Assist for footwear: Dependant Assist for lower body dressing: 2 Helpers  Function - Toileting Toileting activity did not occur: No continent bowel/bladder event Toileting steps completed by patient: Adjust clothing prior to toileting, Performs perineal  hygiene Toileting steps completed by helper: Adjust clothing prior to toileting, Performs perineal hygiene, Adjust clothing after toileting Toileting Assistive Devices: Grab bar or rail Assist level: Two helpers  Function - Air cabin crew transfer activity did not occur: Safety/medical concerns Toilet transfer assistive device: Bedside commode Assist level to toilet: Touching or steadying assistance (Pt > 75%) Assist level from toilet: Touching or steadying assistance (Pt > 75%) Assist level to bedside commode (at bedside): Moderate assist (Pt 50 - 74%/lift or lower) Assist level from bedside commode (at bedside): Moderate assist (Pt 50 - 74%/lift or lower)  Function - Chair/bed transfer Chair/bed transfer method: Other Chair/bed transfer assist level: 2 helpers Chair/bed transfer assistive device: Mechanical lift Mechanical lift: Other(manual hoyer lift) Chair/bed transfer details: Tactile cues for posture, Verbal cues for technique, Verbal cues for sequencing  Function - Locomotion: Wheelchair Will patient use wheelchair at discharge?: No Type: Manual Wheelchair activity did not occur: Safety/medical concerns Max wheelchair distance: 50 Assist Level: Touching or steadying assistance (Pt > 75%) Wheel 50 feet with 2 turns activity did not occur: Safety/medical concerns Assist Level: Touching or steadying assistance (Pt > 75%) Wheel 150 feet activity did not occur: Safety/medical concerns Function - Locomotion: Ambulation Assistive device: Walker-rolling Max distance: 10 Assist level: Touching or steadying assistance (Pt > 75%) Assist level: Touching or steadying assistance (Pt > 75%) Assist level: Touching or steadying assistance (Pt > 75%) Walk 150 feet activity did not occur: Safety/medical concerns Assist level: Moderate assist (Pt 50 - 74%) Assist level: Moderate assist (Pt 50 - 74%)  Function - Comprehension Comprehension: Auditory Comprehension assist level:  Understands basic 50 - 74% of the time/ requires cueing 25 - 49% of the time  Function - Expression Expression: Verbal Expression assist level: Expresses basic 25 - 49% of the time/requires cueing 50 - 75% of the time. Uses single words/gestures.  Function - Social Interaction Social Interaction assist level: Interacts appropriately 25 - 49% of time - Needs frequent redirection.  Function - Problem Solving Problem solving assist level: Solves basic 25 - 49% of the time - needs direction more than half the time to initiate,  plan or complete simple activities  Function - Memory Memory assist level: More than reasonable amount of time, Recognizes or recalls 25 - 49% of the time/requires cueing 50 - 75% of the time Patient normally able to recall (first 3 days only): Current season, That he or she is in a hospital, Location of own room   Medical Problem List and Plan:  1. Right hemiparesis and functional deficits secondary to left periventricular white matter and right frontal lobe infarcts    Cont CIR -  PT, OT, SLP  Increased weakness particularly right upper limb repeat CT scan after PEG did not show new abnormalities, will check MRI   -2. DVT Prophylaxis/Anticoagulation: Pharmaceutical: Lovenox 4m q 24h 3. Chronic HA/Pain Management: Depakote bid with tylenol prn.  Lower extremity paresthesias  Trial of Cymbalta 4. Mood: LCSW to follow for evaluation and support.    Emotional lability ---off of cymbalta, on depakote 5024mbid  -add lexapro low dose, 92m792mhs    I 5. Neuropsych: This patient is not fully capable of making decisions on her own behalf. Appreciate input    6. Skin/Wound Care: routine pressure relief measures.  7. Fluids/Electrolytes/Nutrition: Monitor I/Os   -increase fluids through PEG as BUN trending up 8. 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 Monitor BS ac/hs. Educate on  importance of compliance.  CBG (last 3)  Recent Labs    08/02/17 2155 08/03/17 0658 08/03/17 1156  GLUCAP 155* 150* 215*    reasonably controlled on 5/20 9. Dyslipidemia: On Crestor.  10. Anemia: Hgb stable at 9.5 on 5/1, 9.8 on 5/18 11. HTN with orthostasis-    Monitor BP bid.   -no orthostasis today  -lisinopril is 29m34md,,  measure BPs in sitting position not while in bed.  Appreciate card input Per cards not a good candidate for midodrine or florinef   pt and family aware that this is most likely an autonomic neuropathy and will be managed but not "fixed"   -added low dose metoprolol 12.92mg 94m1 with some improvement Lying BPs need to be high to compensate for orthostatic drops Vitals:   08/03/17 0506 08/03/17 1420  BP: (!) 147/76 (!) 157/75  Pulse: 75 73  Resp: 18 17  Temp: 98.9 F (37.2 C) 98.4 F (36.9 C)  SpO2: 97% 100%   Ordered TEDs  Abdominal binder    Continue to monitor-   -increase H2O flushes thru PEG to 250cc q4hrs for now 12. Hypoalbuminemia  Supplement initiated on 5/4 13.  AMS probable seizure,CT neg forhemmorhagic transformation, EEG neg for seizure , slowing noted,  On Depakene, reviewed Neuro outpt note, possible seizure in 2017 , has also been treated for migraines per outpt Neuro Dr JaffeTomi Likensed Keppra to Valproate depakene however the "seizure" may have been near syncope from orthostatic event  15.  Dysphagia moderately severe-   -PEG placed 5/17  -local care to PEG site, no bleeding noted may resume clopidogrel       LOS (Days) 21 A FACE TO FACE EVALUATION WAS PERFORMED  AndreCharlett Blake/2019, 2:48 PM

## 2017-08-03 NOTE — Progress Notes (Signed)
Occupational Therapy Session Note  Patient Details  Name: Kelly Romero MRN: [id number] Date of Birth: 1965-03-24  Today's Date: 08/03/2017 OT Individual Time: 4098-1191 OT Individual Time Calculation (min): 75 min    Short Term Goals: Week 3:  OT Short Term Goal 1 (Week 3): STG=LTG due to LOS  Skilled Therapeutic Interventions/Progress Updates:    Pt seen for OT session focusing on ADL re-training and family education. Pt in supine upon arrival with NT present assisting with breakfast, hand off to OT. Pt requesting to change brief as incontinent of urine. She required max A for rolling to L, mod A rolling to R in order for hygiene to be completed and brief donned total A. Pt able to partially bridge to assist with LB dressing.  Pt's daughter and son in law arrived for session. Pt's daughter assisted with hands on training, son in law stated he assisted with his parents and didn't need to have hands on assist, education and encouragement provided by therapist.  Pt with R lean in static sitting, multimodal cuing for return to midline. During UB dressing, pt demonstrates decreased R attention compared to previous sessions, she is now also unable to use R UE at functional level. She was able to use at stabilizer level mod I last week. MD came during session and made aware and assessed deficits described.  Completed total A sliding board transfer into w/c. Pt's pants noted to be wet. Therefore, attempted to change pants from w/c  Level in simulation of toileting task. Pt unable to complete w/c push-up as she had last week. Ultimately completed modified stand/squat in order for clothing management to be completed with +2 assist. Pt unable to maintain modified standing position >~5 seconds before requiring seated rest break.  Pt's family ultimately concerned regarding planned upcoming d/c, and understandably so. Recommending completing bathing/dressing from bed level at this time.  Pt left seated in  w/c at end of session, Family present and awaiting PT. Discussed continuum of care, follow up options, DME, and d/c planning.    Therapy Documentation Precautions:  Precautions Precautions: Fall Restrictions Weight Bearing Restrictions: No Pain:   No/denies pain  See Function Navigator for Current Functional Status.   Therapy/Group: Individual Therapy  Marina Desire L 08/03/2017, 6:56 AM

## 2017-08-03 NOTE — Progress Notes (Addendum)
Speech Language Pathology Daily Session Note  Patient Details  Name: Kelly Romero MRN: [id number] Date of Birth: [birth year]  Today's Date: 08/03/2017 SLP Individual Time: 1300-1400 SLP Individual Time Calculation (min): 60 min  Short Term Goals: Week 3: SLP Short Term Goal 1 (Week 3): Pt will utilize word finding strategies to convey basic thoughts and ideas with supervision A cues.  SLP Short Term Goal 2 (Week 3): Pt will utilize speech intelligibility strategies with Mod A  to increase speech intelligibility to ~ 90% at the phrase level.  SLP Short Term Goal 3 (Week 3): Pt will solve semi-complex problem solving tasks with Mod A cues.  SLP Short Term Goal 4 (Week 3): Pt will demonstrate selective attention in moderately distracting environment for 30 minutes with Min A cues.  SLP Short Term Goal 5 (Week 3): Pt will consume puree with honey thick liquids and minimal s/s of aspiration and Min A for useof compensatory swallow strategies.   Skilled Therapeutic Interventions:Skilled ST services focused on swallow and speech skills. SLP facilitated PO consumption of dys 1 and HTL lunch tray, pt continues to demonstrated delayed swallow initiation increase throughout trials likely due to fatigue, no overt s/s aspiration and minimal intake. Pt required max A fading to Mod A verbal cues for functional problem solving accessing items on tray with left hand only due to recent right arm impairment. SLP facilitated word finding skills in structured tasks requring max-mod A verbal cues to name opposities, min A verbal cues of sentence completion to name given defintion, and mod A with simple convergent naming. Pt demonstrates continued increase vocal intensity,however slurred speech at phrase level requiring max A verbal cues. Pt required mod A verbal cues for selective attention in 30 minute intervals. Pt was left in room with call bell within reach. Reccomend to continue skilled ST services.       Function:  Eating Eating   Modified Consistency Diet: Yes Eating Assist Level: Set up assist for;More than reasonable amount of time;Supervision or verbal cues   Eating Set Up Assist For: Opening containers       Cognition Comprehension Comprehension assist level: Understands basic 50 - 74% of the time/ requires cueing 25 - 49% of the time  Expression   Expression assist level: Expresses basic 25 - 49% of the time/requires cueing 50 - 75% of the time. Uses single words/gestures.  Social Interaction Social Interaction assist level: Interacts appropriately 25 - 49% of time - Needs frequent redirection.  Problem Solving Problem solving assist level: Solves basic 25 - 49% of the time - needs direction more than half the time to initiate, plan or complete simple activities  Memory Memory assist level: More than reasonable amount of time;Recognizes or recalls 25 - 49% of the time/requires cueing 50 - 75% of the time    Pain Pain Assessment Pain Score: 0-No pain  Therapy/Group: Individual Therapy  Asher Babilonia  [facility] 08/03/2017, 2:04 PM

## 2017-08-03 NOTE — Progress Notes (Addendum)
Physical Therapy Session Note  Patient Details  Name: ZAKEYA JUNKER MRN: [id number] Date of Birth: 06/12/1965  Today's Date: 08/03/2017 PT Individual Time: 4098-1191 PT Individual Time Calculation (min): 60 min   Short Term Goals: Week 3:  PT Short Term Goal 1 (Week 3): STG = LTG due to estimated d/c date.  Skilled Therapeutic Interventions/Progress Updates:  Pt received in w/c with son-in-law Sharl Ma) and daughter [name]) present for caregiver education. No c/o pain reported. Session focused on caregiver training and hands-on practice. Educated pt & family on recommendations of hospital bed, w/c, slide board, and manual hoyer lift. Educated them that it would be easiest to assist pt with dressing tasks in bed as OT reported pt had difficulty clearing bottom from w/c to allow pants to be pulled over hips. Also educated them on pt's incontinence and need to transfer back to bed for assistance with peri hygiene. Educated them on use of hoyer lift instead of slide board with therapist demonstrating positioning of blue U sling, how to manage parts of manual hoyer lift, and how to transfer pt w/c>bed. [name] performed transfer bed>w/c with hoyer lift with max cuing from therapist for hoyer parts management and sequencing of task. Sharl Ma also assists [name] with positioning of pt in sling and in w/c. Pt's family would benefit from extensive hands on training prior to pt's d/c to ensure safety for all. Sharl Ma reports it is likely that they will use manual hoyer lift at all times with therapist recommending slide board for w/c<>car transfer or either non EMS ambulance transport. They report they have a new SUV but they also have a low [employer] car and therapist recommends using the [employer] to transfer pt in/out of as pt unable to stand pivot to elevated SUV without risking syncopal episode due to orthostatic hypotension. Pt's family verbalized understanding of all education and report they will be here tomorrow  for further hands on training. At end of session pt left in w/c with quick release belt donned & family present to supervise.   Addendum: Also educated family to look for sudden significant changes in regards to stroke symptoms once pt returns home. Pt requires max assist to scoot backwards in w/c (which is a decline) and family educated on head/hips relationship to assist with task.  Therapy Documentation Precautions:  Precautions Precautions: Fall Restrictions Weight Bearing Restrictions: No   See Function Navigator for Current Functional Status.   Therapy/Group: Individual Therapy  Sandi Mariscal 08/03/2017, 11:35 AM

## 2017-08-04 ENCOUNTER — Inpatient Hospital Stay (HOSPITAL_COMMUNITY): Payer: Medicaid Other | Admitting: Occupational Therapy

## 2017-08-04 ENCOUNTER — Inpatient Hospital Stay (HOSPITAL_COMMUNITY): Payer: Medicaid Other | Admitting: Physical Therapy

## 2017-08-04 ENCOUNTER — Inpatient Hospital Stay (HOSPITAL_COMMUNITY): Payer: Medicaid Other

## 2017-08-04 LAB — GLUCOSE, CAPILLARY
GLUCOSE-CAPILLARY: 182 mg/dL — AB (ref 65–99)
GLUCOSE-CAPILLARY: 171 mg/dL — AB (ref 65–99)
Glucose-Capillary: 178 mg/dL — ABNORMAL HIGH (ref 65–99)
Glucose-Capillary: 129 mg/dL — ABNORMAL HIGH (ref 65–99)
Glucose-Capillary: 127 mg/dL — ABNORMAL HIGH (ref 65–99)

## 2017-08-04 NOTE — Progress Notes (Signed)
4 side rails up per patient request for sense of security.Pt aware she is able to request 3 side rails at any time.  Ronnette Juniper, LPN

## 2017-08-04 NOTE — Progress Notes (Addendum)
Speech Language Pathology Daily Session Note  Patient Details  Name: KYMORAH KORF MRN: [id number] Date of Birth: [birth year]  Today's Date: 08/04/2017 SLP Individual Time: 0900-0930 SLP Individual Time Calculation (min): 30 min  Short Term Goals: Week 3: SLP Short Term Goal 1 (Week 3): Pt will utilize word finding strategies to convey basic thoughts and ideas with supervision A cues.  SLP Short Term Goal 2 (Week 3): Pt will utilize speech intelligibility strategies with Mod A  to increase speech intelligibility to ~ 90% at the phrase level.  SLP Short Term Goal 3 (Week 3): Pt will solve semi-complex problem solving tasks with Mod A cues.  SLP Short Term Goal 4 (Week 3): Pt will demonstrate selective attention in moderately distracting environment for 30 minutes with Min A cues.  SLP Short Term Goal 5 (Week 3): Pt will consume puree with honey thick liquids and minimal s/s of aspiration and Min A for useof compensatory swallow strategies.   Skilled Therapeutic Interventions:Skilled ST services focused on speech skills and family education. SLP facilitated speech intelligibility and word finding skills, pt required max A verbal cues for intelligibility at phrase level and mod A at word level and max-mod A verbal cues for word finding at phrase level and mod-min A verbal cues for word finding at word level during structured and unstructured tasks. Pt's daughter was present, SLP provided education on word finding strategies including sentence completion cues, which were helpful to pt. SLP continued education on the importance of continuing PO intake to maintain swallow function and reviewed speech intelligibility strategies, daughter stated understanding. Pt was left in room with call bell within reach. Reccomend to continue skilled ST services.      Function:  Eating Eating                 Cognition Comprehension Comprehension assist level: Understands basic 50 - 74% of the time/  requires cueing 25 - 49% of the time  Expression   Expression assist level: Expresses basic 25 - 49% of the time/requires cueing 50 - 75% of the time. Uses single words/gestures.  Social Interaction Social Interaction assist level: Interacts appropriately 25 - 49% of time - Needs frequent redirection.  Problem Solving Problem solving assist level: Solves basic 25 - 49% of the time - needs direction more than half the time to initiate, plan or complete simple activities  Memory Memory assist level: More than reasonable amount of time;Recognizes or recalls 25 - 49% of the time/requires cueing 50 - 75% of the time    Pain Pain Assessment Pain Scale: 0-10 Pain Score: 0-No pain  Therapy/Group: Individual Therapy  Laureano Hetzer  [facility] 08/04/2017, 4:55 PM

## 2017-08-04 NOTE — Progress Notes (Signed)
Occupational Therapy Session Note  Patient Details  Name: Kelly Romero MRN: [id number] Date of Birth: [birth year]  Today's Date: 08/04/2017 OT Individual Time: 0935-1030 and 1350-1450 OT Individual Time Calculation (min): 55 min and 50 min   Short Term Goals: Week 3:  OT Short Term Goal 1 (Week 3): STG=LTG due to LOS  Skilled Therapeutic Interventions/Progress Updates:    Session One: Pt seen for OT session focusing on ADL re-training with family education. Pt awake in supine upon arrival with daughter present for hands on family education.  Completed LB dressing from bed level, pt's daughter assisting with all aspects of dressing task. Pt able to roll to R with supervision, mod A to roll to L during clothing management and hygiene. Pt able to partially bridge to assist with pulling pants up. Pt rolled while pt's daughter placed Hoyer sling. She was able to correctly thread hoyer sling onto lift with min cuing from therapist. She was able to steer lift while therapist pulled pt's hips back into chair. Cont with education of need for +2 any time using the lift. Discussed plans for toileting, will be very difficult transfer and task. Will attempt at next session to determine feasibility of task.  Pt left seated in w/c at end of session with daughter present to assist with UB dressing.   Session Two: Pt seen for OT session with focusing cont on family education and d/c planning. Pt sitting up in w/c upon arrival with daughter present. Pt with complaints of fatigue from previous session and desiring to return to bed. Pt agreeable to trying [facility] lift transfer to [facility] prior to getting back to bed. Pt's daughter set-up sling and Michiel Sites lift with cuing from therapist throughout. Upon transfer to [facility], pt voiced she had to pee, didn't believe she had energy or ability to complete toileting task from [facility]. Therefore, used lift to transfer back to bed. Pt's daughter assisted pt from bed level to remove  clothing and place bed pan. Bed pan spilling out urine upon attempt to remove it. Therefore, lifted pt in [place] to hover over bed while bed linens changed and new brief placed. Rolled with mod A to L and min A to right for clothing management to be completed. Pt left in supine at end of session, all needs in reach and bed alarm on. Education provided throughout session regarding positioning of R UE, continuum of care, activity progression, and d/c planning.    Therapy Documentation Precautions:  Precautions Precautions: Fall Restrictions Weight Bearing Restrictions: No Pain: Pain Assessment Pain Scale: 0-10 Pain Score: 0-No pain  See Function Navigator for Current Functional Status.   Therapy/Group: Individual Therapy  Ector Laurel L 08/04/2017, 7:11 AM

## 2017-08-04 NOTE — Plan of Care (Signed)
Goals modified following stroke extension and change of medical status. Goals downgraded to mod-max overall.  Standing, toileting, toilet transfer, and LB dressing goals were d/c as pt total A for these tasks. Recommending toileting and LB dressing tasks to be completed at bed level at d/c for safety and to reduce caregiver burden. See POC for goal details. Carolynn Tuley, OTR/L

## 2017-08-04 NOTE — Significant Event (Signed)
Writer responded to bed alarm at approximately 0445 and observed patient sitting partially upright on the floor beside her bed.  No obvious signs of injury. Alert and oriented X 4. Neuro and musculoskeletal status at baseline.  Denies hitting head during fall.  Denies pain. States she accidentally rolled out of bed.  Vital signs obtained with no variations from baseline.  Transferred from floor to bed via maxi move with assistance from 3 staff members. CBG obtained. Skin assessment performed, no new findings.  Patient tearful, emotional support provided.   4 siderails up per patient request.  Deatra Ina notified at [phone].  No new orders given.  Daughter, [name], notified at 66.

## 2017-08-04 NOTE — Progress Notes (Signed)
Physical Therapy Session Note  Patient Details  Name: Kelly Romero MRN: [id number] Date of Birth: 1965-11-08  Today's Date: 08/04/2017 PT Individual Time: 1108-1204 PT Individual Time Calculation (min): 56 min   Short Term Goals: Week 3:  PT Short Term Goal 1 (Week 3): STG = LTG due to estimated d/c date.  Skilled Therapeutic Interventions/Progress Updates:  Pt received in w/c with daughter [name]) present for hands on training. No c/o pain reported. Pt did note SOB when lying completely flat in bed therefore HOB elevated & SpO2 = 97-99%. Session focused on caregiver education and hands on training. Educated them on d/c plan of Friday after therapy and need for non EMS ambulance transport home to increase safety. Also educated them to not use BSC at all, until they worked on this with HHPT; also reiterated that pt is incontinent and they will have to assist her back to bed to perform peri hygiene and dressing. Pt's daughter completed w/c<>hospital bed transfer with use of manual hoyer lift and max cuing from therapist for positioning of sling, donning/removing sling in bed/chair, w/c parts management, assisting with pt rolling in bed (use rails on one side while daughter stands on the other), pt's limited ability to roll L 2/2 R hemiplegia, anterior weight shifting and hand placement to assist pt with scooting back in w/c, and blocking at RLE during scooting tasks. Pt's daughter demonstrates improving ability to transfer pt with hoyer lift and good use of brakes and managing legs of lift. [name] would benefit from continued hands on training to increase her & the pt's comfort level with transferring in this method. At end of session pt left sitting in w/c in room with quick release belt & [name] present to supervise (educated [name] on ability to use gait belt at home to prevent pt from sliding out of w/c).  Therapy Documentation Precautions:  Precautions Precautions: Fall Restrictions Weight  Bearing Restrictions: No   See Function Navigator for Current Functional Status.   Therapy/Group: Individual Therapy  Sandi Mariscal 08/04/2017, 12:19 PM

## 2017-08-04 NOTE — Plan of Care (Signed)
  Problem: Consults Goal: RH STROKE PATIENT EDUCATION Description See Patient Education module for education specifics  Outcome: Progressing   Problem: RH BOWEL ELIMINATION Goal: RH STG MANAGE BOWEL WITH ASSISTANCE Description STG Manage Bowel with mod Assistance.  Outcome: Progressing   Problem: RH BLADDER ELIMINATION Goal: RH STG MANAGE BLADDER WITH ASSISTANCE Description STG Manage Bladder With min. Assistance  Outcome: Progressing   Problem: RH SKIN INTEGRITY Goal: RH STG SKIN FREE OF INFECTION/BREAKDOWN Description With min. assist.  Outcome: Progressing Goal: RH STG MAINTAIN SKIN INTEGRITY WITH ASSISTANCE Description STG Maintain Skin Integrity With min. Assistance.  Outcome: Progressing   Problem: RH SAFETY Goal: RH STG ADHERE TO SAFETY PRECAUTIONS W/ASSISTANCE/DEVICE Description STG Adhere to Safety Precautions With mod.Assistance/Device.  Outcome: Progressing   Problem: RH PAIN MANAGEMENT Goal: RH STG PAIN MANAGED AT OR BELOW PT'S PAIN GOAL Description Less than 3.  Outcome: Progressing   Problem: RH KNOWLEDGE DEFICIT Goal: RH STG INCREASE KNOWLEDGE OF DIABETES Outcome: Progressing Goal: RH STG INCREASE KNOWLEDGE OF HYPERTENSION Outcome: Progressing Goal: RH STG INCREASE KNOWLEDGE OF DYSPHAGIA/FLUID INTAKE Outcome: Progressing   Problem: RH Other (Specify) Goal: RH LTG Other (Specify)1 Outcome: Progressing

## 2017-08-04 NOTE — Progress Notes (Signed)
Family and patient educated on tube care and medication/feeding administration through gtube. Writer demonstrated and daughter repeated/completed task for hands on teaching. Family and patient verbalized understanding. Ronnette Juniper, LPN

## 2017-08-04 NOTE — Patient Care Conference (Signed)
Inpatient RehabilitationTeam Conference and Plan of Care Update Date: 08/04/2017   Time: 10:30 AM    Patient Name: Kelly Romero      Medical Record Number: [id number]  Date of Birth: 13-May-1965 Sex: Female         Room/Bed: 4W25C/4W25C-01 Payor Info: Payor: MEDICAID [place] / Plan: MEDICAID [place] ACCESS / Product Type: *No Product type* /    Admitting Diagnosis: CVA  Admit Date/Time:  07/13/2017  6:11 PM Admission Comments: No comment available   Primary Diagnosis:  <principal problem not specified> Principal Problem: <principal problem not specified>  Patient Active Problem List   Diagnosis Date Noted  . Hypoalbuminemia due to protein-calorie malnutrition (HCC)   . Orthostatic hypotension   . Poorly controlled type 2 diabetes mellitus with peripheral neuropathy (HCC)   . Emotional lability   . Thrombotic stroke (HCC) 07/13/2017  . Diabetes mellitus type 2 in obese (HCC)   . History of CVA (cerebrovascular accident)   . Benign essential HTN   . Dysphagia, post-stroke   . Acute blood loss anemia   . Morbid obesity (HCC)   . Right hemiparesis (HCC)   . Cryptogenic stroke (HCC) 07/08/2017  . Chest pain 11/06/2016  . 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 Aphasia as late effect of stroke 04/10/2016  . Encephalopathy   . Cerebral thrombosis with cerebral infarction 12/29/2015  . Hyperglycemia   . Hyperlipidemia   . Hypertensive emergency   . Deep venous thrombosis (HCC)   . AKI (acute kidney injury) (HCC) 12/26/2015  . Altered mental status   . Stroke-like symptoms   . Acute encephalopathy 08/17/2015  . Anxiety and depression   . CVA (cerebrovascular accident due to intracerebral hemorrhage) (HCC) 08/15/2015  . Stroke (HCC)   . Encephalopathy, metabolic 12/28/2014  . 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 Essential hypertension 12/28/2014  . New onset headache 11/02/2014    Expected  Discharge Date: Expected Discharge Date: 08/06/17  Team Members Present: Physician leading conference: Dr. Claudette Laws Social Worker Present: Dossie Der, LCSW Nurse Present: Other (comment)(katelynn Perkins-RN) PT Present: Aleda Grana, PT PPS Coordinator present : Tora Duck, RN, CRRN     Current Status/Progress Goal Weekly Team Focus  Medical   Extension of CVA with increased right-sided weakness, status post PEG placement for poor intake, continued orthostasis but less impact due to reduced mobility  Reduce fall risk  Discharge planning caregiver training   Bowel/Bladder   incontinent of B/B  Regain continence of B/B  Timed toileting   Swallow/Nutrition/ Hydration   dys 1 and HTL Max A  Max A - downgraded 07/28/17  theraputic trials to maintain swallow function    ADL's   Max A bathing and LB dressing; mod A UB dressing; total A toileting; max A sliding board transfers  Will downgrade to max A overall following change in medical status  ADL re-training, Family training, d/c planning   Mobility   limited OOB activity 2/2 medical issues, working on transfers as pt to d/c home at transfer level only 2/2 BP issues  min assist transfers, min assist w/c mobility  transfers, bed mobility, activity tolerance, pt/family education & d/c planning   Communication   Max A phrase level, Mod A word level, and Mod-Max A word finding  Max A - downgraded 07/28/17  speech intelligibility strategies at word and Phrase level, word finding strategies    Safety/Cognition/ Behavioral Observations  Max A semi-complex probelm solving,  Max-Mod A basic problem solving   Mod A - downgraded 5/21  functional problem solving, intellectual awareness and attention    Pain   Denies Pain  Pain < 2  Assess Qshift and PRN   Skin   Skin intact  Prevent breakdown  assess Qshift and PRN      *See Care Plan and progress notes for long and short-term goals.     Barriers to Discharge  Current Status/Progress  Possible Resolutions Date Resolved   Physician    Medical stability;Nutrition means     Tolerating tube feeds  Extensive family training, lift transfers      Nursing                  PT  Decreased caregiver support;Lack of/limited family support;Incontinence;Behavior  pt with impaired cognition, limited by medical issues (specifically BP), pt needs A LOT more hands on training prior to d/c as very limited training has been completed thus far              OT                  SLP                SW                Discharge Planning/Teaching Needs:  On-going family education with daughter and son in-law. Aware pt will require 24 hr physical care. Family is very committed to pt and want to take her home      Team Discussion:  Extended stroke Sat per MRI. Family continues to want to take home and is here for education. Downgraded goals to mod/max level. Nursing educating on PEG feeds. Will need ambulance home car transfer not realistic at this time.  Revisions to Treatment Plan:  DC 5/24 Goals downgraded to mod-max level of assist    Continued Need for Acute Rehabilitation Level of Care: The patient requires daily medical management by a physician with specialized training in physical medicine and rehabilitation for the following conditions: Daily direction of a multidisciplinary physical rehabilitation program to ensure safe treatment while eliciting the highest outcome that is of practical value to the patient.: Yes Daily medical management of patient stability for increased activity during participation in an intensive rehabilitation regime.: Yes Daily analysis of laboratory values and/or radiology reports with any subsequent need for medication adjustment of medical intervention for : Blood pressure problems;Neurological problems;Mood/behavior problems  [name] 08/04/2017, 12:42 PM

## 2017-08-04 NOTE — Progress Notes (Signed)
Subjective/Complaints:  Rolled out of bed last noc , discussed MRI results ROS: Limited due to cognitive/behavioral    Objective: Vital Signs: Blood pressure (!) 187/76, pulse 75, temperature 98.6 F (37 C), temperature source Oral, resp. rate 16, height '5\' 5"'$  (1.651 m), weight 95.3 kg (210 lb 1.6 oz), SpO2 97 %. Kelly Romero 74 Contrast  Result Date: 08/03/2017 CLINICAL DATA:  52 y/o F; increased weakness of the right upper limb. EXAM: MRI HEAD WITHOUT CONTRAST TECHNIQUE: Axial DWI, coronal DWI, sagittal T1, axial T2, and axial T2 FLAIR sequences were acquired. The patient was unable to continue the examination. COMPARISON:  07/11/2017 MRI head.  07/31/2017 CT head. FINDINGS: Romero: Increase size of infarction within the left corona radiata and caudate body measuring 2.7 x 1.3 x 1.3 cm (volume = 2.4 cm^3)(AP x ML x CC). Stable subcentimeter scattered foci of infarction within the bilateral frontal lobes. No focal mass effect at hydrocephalus, or herniation. Stable chronic left PCA infarctions, bilateral basal ganglia chronic lacunar infarcts, microvascular ischemic changes, and parenchymal volume loss of the Romero. Motion degraded T2 FLAIR weighted sequence. Vascular: Normal flow voids. Skull and upper cervical spine: Normal marrow signal. Sinuses/Orbits: Negative. Other: Bilateral intra-ocular lens replacement. IMPRESSION: 1. Increased size of acute infarction within left corona radiata and caudate body, probable interval infarction of the penumbra. 2. Stable subcentimeter scattered foci of acute infarction within the bilateral frontal lobes. 3. Stable chronic infarcts, microvascular ischemic changes, and parenchymal volume loss of the Romero. Electronically Signed   By: Kristine Garbe M.D.   On: 08/03/2017 20:44   Results for orders placed or performed during the hospital encounter of 07/13/17 (from the past 72 hour(s))  Glucose, capillary     Status: Abnormal   Collection Time: 08/01/17  11:55 AM  Result Value Ref Range   Glucose-Capillary 171 (H) 65 - 99 mg/dL  Glucose, capillary     Status: Abnormal   Collection Time: 08/01/17  4:59 PM  Result Value Ref Range   Glucose-Capillary 141 (H) 65 - 99 mg/dL  Glucose, capillary     Status: Abnormal   Collection Time: 08/01/17 10:01 PM  Result Value Ref Range   Glucose-Capillary 143 (H) 65 - 99 mg/dL  Basic metabolic panel     Status: Abnormal   Collection Time: 08/02/17  5:38 AM  Result Value Ref Range   Sodium 139 135 - 145 mmol/L   Potassium 3.7 3.5 - 5.1 mmol/L   Chloride 103 101 - 111 mmol/L   CO2 28 22 - 32 mmol/L   Glucose, Bld 129 (H) 65 - 99 mg/dL   BUN 26 (H) 6 - 20 mg/dL   Creatinine, Ser 1.15 (H) 0.44 - 1.00 mg/dL   Calcium 9.0 8.9 - 10.3 mg/dL   GFR calc non Af Amer 53 (L) >60 mL/min   GFR calc Af Amer >60 >60 mL/min    Comment: (NOTE) The eGFR has been calculated using the CKD EPI equation. This calculation has not been validated in all clinical situations. eGFR's persistently <60 mL/min signify possible Chronic Kidney Disease.    Anion gap 8 5 - 15    Comment: Performed at [facility] [address], [place], [place] [address]  Glucose, capillary     Status: Abnormal   Collection Time: 08/02/17  6:43 AM  Result Value Ref Range   Glucose-Capillary 135 (H) 65 - 99 mg/dL  Glucose, capillary     Status: Abnormal   Collection Time: 08/02/17 12:03 PM  Result Value Ref Range   Glucose-Capillary 169 (H) 65 - 99 mg/dL  Glucose, capillary     Status: Abnormal   Collection Time: 08/02/17  5:11 PM  Result Value Ref Range   Glucose-Capillary 144 (H) 65 - 99 mg/dL  Glucose, capillary     Status: Abnormal   Collection Time: 08/02/17  9:55 PM  Result Value Ref Range   Glucose-Capillary 155 (H) 65 - 99 mg/dL  Basic metabolic panel     Status: Abnormal   Collection Time: 08/03/17  6:54 AM  Result Value Ref Range   Sodium 141 135 - 145 mmol/L   Potassium 3.5 3.5 - 5.1 mmol/L   Chloride 102 101 - 111  mmol/L   CO2 32 22 - 32 mmol/L   Glucose, Bld 165 (H) 65 - 99 mg/dL   BUN 21 (H) 6 - 20 mg/dL   Creatinine, Ser 1.06 (H) 0.44 - 1.00 mg/dL   Calcium 9.1 8.9 - 10.3 mg/dL   GFR calc non Af Amer 59 (L) >60 mL/min   GFR calc Af Amer >60 >60 mL/min    Comment: (NOTE) The eGFR has been calculated using the CKD EPI equation. This calculation has not been validated in all clinical situations. eGFR's persistently <60 mL/min signify possible Chronic Kidney Disease.    Anion gap 7 5 - 15    Comment: Performed at [facility] [address] [place],  [address]  CBC     Status: Abnormal   Collection Time: 08/03/17  6:54 AM  Result Value Ref Range   WBC 5.0 4.0 - 10.5 K/uL   RBC 3.51 (L) 3.87 - 5.11 MIL/uL   Hemoglobin 9.8 (L) 12.0 - 15.0 g/dL   HCT 31.6 (L) 36.0 - 46.0 %   MCV 90.0 78.0 - 100.0 fL   MCH 27.9 26.0 - 34.0 pg   MCHC 31.0 30.0 - 36.0 g/dL   RDW 13.1 11.5 - 15.5 %   Platelets 244 150 - 400 K/uL    Comment: Performed at [facility], [place] [address] [place], [place] [address]  Glucose, capillary     Status: Abnormal   Collection Time: 08/03/17  6:58 AM  Result Value Ref Range   Glucose-Capillary 150 (H) 65 - 99 mg/dL  Glucose, capillary     Status: Abnormal   Collection Time: 08/03/17 11:56 AM  Result Value Ref Range   Glucose-Capillary 215 (H) 65 - 99 mg/dL  Glucose, capillary     Status: Abnormal   Collection Time: 08/03/17  4:59 PM  Result Value Ref Range   Glucose-Capillary 137 (H) 65 - 99 mg/dL  Glucose, capillary     Status: Abnormal   Collection Time: 08/03/17  9:43 PM  Result Value Ref Range   Glucose-Capillary 135 (H) 65 - 99 mg/dL  Glucose, capillary     Status: Abnormal   Collection Time: 08/04/17  5:08 AM  Result Value Ref Range   Glucose-Capillary 182 (H) 65 - 99 mg/dL  Glucose, capillary     Status: Abnormal   Collection Time: 08/04/17  6:34 AM  Result Value Ref Range   Glucose-Capillary 171 (H) 65 - 99 mg/dL     Constitutional:   VS reviewed HEENT: EOMI, oral membranes moist Neck: supple Cardiovascular: RRR without murmur. No JVD    Respiratory: CTA Bilaterally without wheezes or rales. Normal effort    GI:  abd tender near PEG. PEG site intact  Musc: No edema or tenderness in extremities.  Skin:   Intact. Warm and dry.    Neuro:  Alert Right facial droop without changes Alert/ oriented to person not time Motor: 3-5 RUE proximal to distal-compared to 4/5 prior to PEG 4+/5 LUE proximal to distal- stable RLE: 3-/5 HF, KE, ADF, 4/5 (essentially the same) LLE: 3-/5 HF, KE, ADF stable Dysarthria Psych: somewhat anxious, depressed appearing    Assessment/Plan: 1. 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 Physiatrist is providing close team supervision and 24 hour management of active medical problems listed below. Physiatrist and rehab team continue to assess barriers to discharge/monitor patient progress toward functional and medical goals. FIM: Function - Bathing Bathing activity did not occur: Refused Position: Shower Body parts bathed by patient: Chest, Abdomen, Front perineal area, Right upper leg, Left upper leg, Right arm, Left arm Body parts bathed by helper: Right arm, Left arm, Chest, Abdomen, Front perineal area, Buttocks, Right upper leg, Left upper leg, Right lower leg, Left lower leg, Back Assist Level: 2 helpers  Function- Upper Body Dressing/Undressing What is the patient wearing?: Pull over shirt/dress Pull over shirt/dress - Perfomed by patient: Thread/unthread right sleeve, Thread/unthread left sleeve, Pull shirt over trunk, Put head through opening Pull over shirt/dress - Perfomed by helper: Thread/unthread right sleeve Assist Level: Touching or steadying  assistance(Pt > 75%) Function - Lower Body Dressing/Undressing What is the patient wearing?: Pants, Non-skid slipper socks, Ted Hose Position: Sitting EOB Underwear - Performed by helper: Thread/unthread right underwear leg, Thread/unthread left underwear leg, Pull underwear up/down Pants- Performed by patient: Thread/unthread right pants leg, Thread/unthread left pants leg Pants- Performed by helper: Thread/unthread right pants leg, Thread/unthread left pants leg Non-skid slipper socks- Performed by helper: Don/doff right sock, Don/doff left sock Socks - Performed by helper: Don/doff right sock, Don/doff left sock Shoes - Performed by patient: Don/doff right shoe, Fasten right Shoes - Performed by helper: Don/doff left shoe, Fasten left, Don/doff right shoe, Fasten right TED Hose - Performed by helper: Don/doff right TED hose, Don/doff left TED hose Assist for footwear: Dependant Assist for lower body dressing: 2 Helpers  Function - Toileting Toileting activity did not occur: No continent bowel/bladder event Toileting steps completed by patient: Adjust clothing prior to toileting, Performs perineal hygiene Toileting steps completed by helper: Adjust clothing prior to toileting, Performs perineal hygiene, Adjust clothing after toileting Toileting Assistive Devices: Grab bar or rail Assist level: Two helpers  Function - Air cabin crew transfer activity did not occur: Safety/medical concerns Toilet transfer assistive device: Bedside commode Assist level to toilet: Touching or steadying assistance (Pt > 75%) Assist level from toilet: Touching or steadying assistance (Pt > 75%) Assist level to bedside commode (at bedside): Moderate assist (Pt 50 - 74%/lift or lower) Assist level from bedside commode (at bedside): Moderate assist (Pt 50 - 74%/lift or lower)  Function - Chair/bed transfer Chair/bed transfer method: Other Chair/bed transfer assist level: 2 helpers Chair/bed  transfer assistive device: Mechanical lift Mechanical lift: Other(manual hoyer lift) Chair/bed transfer details: Tactile cues for posture, Verbal cues for technique, Verbal cues for sequencing  Function - Locomotion: Wheelchair Will patient use wheelchair at discharge?: No Type: Manual Wheelchair activity did not occur: Safety/medical concerns Max wheelchair distance: 50 Assist Level: Touching or steadying assistance (Pt > 75%) Wheel 50 feet with 2 turns activity did not occur: Safety/medical concerns  Assist Level: Touching or steadying assistance (Pt > 75%) Wheel 150 feet activity did not occur: Safety/medical concerns Function - Locomotion: Ambulation Assistive device: Walker-rolling Max distance: 10 Assist level: Touching or steadying assistance (Pt > 75%) Assist level: Touching or steadying assistance (Pt > 75%) Assist level: Touching or steadying assistance (Pt > 75%) Walk 150 feet activity did not occur: Safety/medical concerns Assist level: Moderate assist (Pt 50 - 74%) Assist level: Moderate assist (Pt 50 - 74%)  Function - Comprehension Comprehension: Auditory Comprehension assist level: Understands basic 50 - 74% of the time/ requires cueing 25 - 49% of the time  Function - Expression Expression: Verbal Expression assist level: Expresses basic 25 - 49% of the time/requires cueing 50 - 75% of the time. Uses single words/gestures.  Function - Social Interaction Social Interaction assist level: Interacts appropriately 25 - 49% of time - Needs frequent redirection.  Function - Problem Solving Problem solving assist level: Solves basic 25 - 49% of the time - needs direction more than half the time to initiate, plan or complete simple activities  Function - Memory Memory assist level: More than reasonable amount of time, Recognizes or recalls 25 - 49% of the time/requires cueing 50 - 75% of the time Patient normally able to recall (first 3 days only): Current season, That  he or she is in a hospital, Location of own room   Medical Problem List and Plan:  1. Right hemiparesis and functional deficits secondary to left periventricular white matter and right frontal lobe infarcts    Cont CIR -  PT, OT, SLP  Increased weakness particularly right upper limb repeat CT scan after PEG did not show new abnormalities,  MRI showed extension of Left corona radiata and caudate infarct which would explain her symptoms   -2. DVT Prophylaxis/Anticoagulation: Pharmaceutical: Lovenox '40mg'$  q 24h 3. Chronic HA/Pain Management: Depakote bid with tylenol prn.  Lower extremity paresthesias  Trial of Cymbalta 4. Mood: LCSW to follow for evaluation and support.    Emotional lability ---off of cymbalta, on depakote '500mg'$  bid  -add lexapro low dose, '5mg'$  qhs    I 5. Neuropsych: This patient is not fully capable of making decisions on her own behalf. Appreciate input    6. Skin/Wound Care: routine pressure relief measures.  7. Fluids/Electrolytes/Nutrition: Monitor I/Os   - 8. 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 Monitor BS ac/hs. Educate on importance of compliance.  CBG (last 3)  Recent Labs    08/03/17 2143 08/04/17 0508 08/04/17 0634  GLUCAP 135* 182* 171*   controlled on 5/22 9. Dyslipidemia: On Crestor.  10. Anemia: Hgb stable at 9.5 on 5/1, 9.8 on 5/18 and 5/21 11. HTN with orthostasis-    Monitor BP bid.   -no orthostasis today  -lisinopril is '10mg'$  bid,,  measure BPs in sitting position not while in bed.  Appreciate card input Per cards not a good candidate for midodrine or florinef   pt and family aware that this is most likely an autonomic neuropathy and will be managed but not "fixed"   -added low dose metoprolol 12.'5mg'$  5/11 with some improvement Lying BPs need to be high to compensate for orthostatic drops Vitals:   08/04/17 0417 08/04/17 0505  BP: (!) 181/77 (!) 187/76   Pulse: 71 75  Resp: 18 16  Temp: 98.6 F (37 C)   SpO2: 97% 97%   Ordered TEDs  Abdominal binder  Continue to monitor-   -increased H2O flushes thru PEG to 250cc q4hrs, BUN normalized will reduce to QID 12. Hypoalbuminemia  Supplement initiated on 5/4 13.  AMS probable seizure,CT neg forhemmorhagic transformation, EEG neg for seizure , slowing noted,  On Depakene, reviewed Neuro outpt note, possible seizure in 2017 , has also been treated for migraines per outpt Neuro Dr Tomi Likens, added Keppra to Valproate depakene however the "seizure" may have been near syncope from orthostatic event  15.  Dysphagia moderately severe-   -PEG placed 5/17  -local care to PEG site, no bleeding noted may resume clopidogrel       LOS (Days) 22 A FACE TO FACE EVALUATION WAS PERFORMED  Charlett Blake 08/04/2017, 9:48 AM

## 2017-08-05 ENCOUNTER — Inpatient Hospital Stay (HOSPITAL_COMMUNITY): Payer: Medicaid Other | Admitting: Speech Pathology

## 2017-08-05 ENCOUNTER — Inpatient Hospital Stay (HOSPITAL_COMMUNITY): Payer: Medicaid Other

## 2017-08-05 ENCOUNTER — Inpatient Hospital Stay (HOSPITAL_COMMUNITY): Payer: Medicaid Other | Admitting: Occupational Therapy

## 2017-08-05 LAB — CULTURE, BLOOD (SINGLE)
CULTURE: NO GROWTH
Special Requests: ADEQUATE

## 2017-08-05 LAB — GLUCOSE, CAPILLARY
GLUCOSE-CAPILLARY: 131 mg/dL — AB (ref 65–99)
GLUCOSE-CAPILLARY: 220 mg/dL — AB (ref 65–99)
Glucose-Capillary: 217 mg/dL — ABNORMAL HIGH (ref 65–99)
Glucose-Capillary: 102 mg/dL — ABNORMAL HIGH (ref 65–99)

## 2017-08-05 NOTE — Progress Notes (Signed)
Speech Language Pathology Daily Session Note  Patient Details  Name: Kelly Romero MRN: [id number] Date of Birth: September 13, 1965  Today's Date: 08/05/2017 SLP Individual Time: 6578-4696 SLP Individual Time Calculation (min): 28 min  Short Term Goals: Week 3: SLP Short Term Goal 1 (Week 3): Pt will utilize word finding strategies to convey basic thoughts and ideas with supervision A cues.  SLP Short Term Goal 2 (Week 3): Pt will utilize speech intelligibility strategies with Mod A  to increase speech intelligibility to ~ 90% at the phrase level.  SLP Short Term Goal 3 (Week 3): Pt will solve semi-complex problem solving tasks with Mod A cues.  SLP Short Term Goal 4 (Week 3): Pt will demonstrate selective attention in moderately distracting environment for 30 minutes with Min A cues.  SLP Short Term Goal 5 (Week 3): Pt will consume puree with honey thick liquids and minimal s/s of aspiration and Min A for useof compensatory swallow strategies.   Skilled Therapeutic Interventions:  Pt was seen for skilled ST targeting dysphagia goals.  SLP facilitated the session with therapeutic trials of thin liquids via teaspoon to continue working towards diet progression.  Pt demonstrated immediate, explosive cough in 1 out of 10 opportunities which occurred shortly after administration of pain meds via PEG tube.  Pt unable to differentiate whether cough was related to PO intake versus reflux following use of PEG tube.   Pt reports feeling nervous when consuming thin liquids.  Discussed rationale for completion of oral care prior to trials to minimize infection risk if pt were to aspirate.  All questions were answered to pt's and family's satisfaction at this time.  Continue per current plan of care.    Function:  Eating Eating   Modified Consistency Diet: Yes Eating Assist Level: Help managing cup/glass;Help with picking up utensils           Cognition Comprehension Comprehension assist level:  Understands basic 90% of the time/cues < 10% of the time  Expression   Expression assist level: Expresses basic 50 - 74% of the time/requires cueing 25 - 49% of the time. Needs to repeat parts of sentences.  Social Interaction Social Interaction assist level: Interacts appropriately 75 - 89% of the time - Needs redirection for appropriate language or to initiate interaction.  Problem Solving Problem solving assist level: Solves basic 50 - 74% of the time/requires cueing 25 - 49% of the time  Memory Memory assist level: Recognizes or recalls 50 - 74% of the time/requires cueing 25 - 49% of the time    Pain Pain Assessment Pain Scale: Faces Pain Score: 0-No pain Faces Pain Scale: Hurts a little bit Pain Type: Surgical pain Pain Location: Abdomen Pain Descriptors / Indicators: Aching Pain Intervention(s): RN made aware  Therapy/Group: Individual Therapy  Nikaya Nasby, Melanee Spry 08/05/2017, 12:17 PM

## 2017-08-05 NOTE — Progress Notes (Signed)
Subjective/Complaints:  Discussed bolus feeds with the patient's nurse.  She is teaching family states that this is going well so far. ROS: Limited due to cognitive/behavioral    Objective: Vital Signs: Blood pressure (!) 121/51, pulse 71, temperature 98.5 F (36.9 C), temperature source Oral, resp. rate 16, height _0  (1.651 m), weight 95.3 kg (210 lb 1.6 oz), SpO2 100 %. Mr Brain 8 Contrast  Result Date: 08/03/2017 CLINICAL DATA:  52 y/o F; increased weakness of the right upper limb. EXAM: MRI HEAD WITHOUT CONTRAST TECHNIQUE: Axial DWI, coronal DWI, sagittal T1, axial T2, and axial T2 FLAIR sequences were acquired. The patient was unable to continue the examination. COMPARISON:  07/11/2017 MRI head.  07/31/2017 CT head. FINDINGS: Brain: Increase size of infarction within the left corona radiata and caudate body measuring 2.7 x 1.3 x 1.3 cm (volume = 2.4 cm^3)(AP x ML x CC). Stable subcentimeter scattered foci of infarction within the bilateral frontal lobes. No focal mass effect at hydrocephalus, or herniation. Stable chronic left PCA infarctions, bilateral basal ganglia chronic lacunar infarcts, microvascular ischemic changes, and parenchymal volume loss of the brain. Motion degraded T2 FLAIR weighted sequence. Vascular: Normal flow voids. Skull and upper cervical spine: Normal marrow signal. Sinuses/Orbits: Negative. Other: Bilateral intra-ocular lens replacement. IMPRESSION: 1. Increased size of acute infarction within left corona radiata and caudate body, probable interval infarction of the penumbra. 2. Stable subcentimeter scattered foci of acute infarction within the bilateral frontal lobes. 3. Stable chronic infarcts, microvascular ischemic changes, and parenchymal volume loss of the brain. Electronically Signed   By: Kristine Garbe M.D.   On: 08/03/2017 20:44   Results for orders placed or performed during the hospital encounter of 07/13/17 (from the past 72 hour(s))  Glucose,  capillary     Status: Abnormal   Collection Time: 08/02/17  5:11 PM  Result Value Ref Range   Glucose-Capillary 144 (H) 65 - 99 mg/dL  Glucose, capillary     Status: Abnormal   Collection Time: 08/02/17  9:55 PM  Result Value Ref Range   Glucose-Capillary 155 (H) 65 - 99 mg/dL  Basic metabolic panel     Status: Abnormal   Collection Time: 08/03/17  6:54 AM  Result Value Ref Range   Sodium 141 135 - 145 mmol/L   Potassium 3.5 3.5 - 5.1 mmol/L   Chloride 102 101 - 111 mmol/L   CO2 32 22 - 32 mmol/L   Glucose, Bld 165 (H) 65 - 99 mg/dL   BUN 21 (H) 6 - 20 mg/dL   Creatinine, Ser 1.06 (H) 0.44 - 1.00 mg/dL   Calcium 9.1 8.9 - 10.3 mg/dL   GFR calc non Af Amer 59 (L) >60 mL/min   GFR calc Af Amer >60 >60 mL/min    Comment: (NOTE) The eGFR has been calculated using the CKD EPI equation. This calculation has not been validated in all clinical situations. eGFR's persistently <60 mL/min signify possible Chronic Kidney Disease.    Anion gap 7 5 - 15    Comment: Performed at [facility] [address] [place],  [address]  CBC     Status: Abnormal   Collection Time: 08/03/17  6:54 AM  Result Value Ref Range   WBC 5.0 4.0 - 10.5 K/uL   RBC 3.51 (L) 3.87 - 5.11 MIL/uL   Hemoglobin 9.8 (L) 12.0 - 15.0 g/dL   HCT 31.6 (L) 36.0 - 46.0 %   MCV 90.0 78.0 - 100.0 fL   MCH  27.9 26.0 - 34.0 pg   MCHC 31.0 30.0 - 36.0 g/dL   RDW 13.1 11.5 - 15.5 %   Platelets 244 150 - 400 K/uL    Comment: Performed at [facility], [place] [address], [place], [place] [address]  Glucose, capillary     Status: Abnormal   Collection Time: 08/03/17  6:58 AM  Result Value Ref Range   Glucose-Capillary 150 (H) 65 - 99 mg/dL  Glucose, capillary     Status: Abnormal   Collection Time: 08/03/17 11:56 AM  Result Value Ref Range   Glucose-Capillary 215 (H) 65 - 99 mg/dL  Glucose, capillary     Status: Abnormal   Collection Time: 08/03/17  4:59 PM  Result Value Ref Range   Glucose-Capillary 137  (H) 65 - 99 mg/dL  Glucose, capillary     Status: Abnormal   Collection Time: 08/03/17  9:43 PM  Result Value Ref Range   Glucose-Capillary 135 (H) 65 - 99 mg/dL  Glucose, capillary     Status: Abnormal   Collection Time: 08/04/17  5:08 AM  Result Value Ref Range   Glucose-Capillary 182 (H) 65 - 99 mg/dL  Glucose, capillary     Status: Abnormal   Collection Time: 08/04/17  6:34 AM  Result Value Ref Range   Glucose-Capillary 171 (H) 65 - 99 mg/dL  Glucose, capillary     Status: Abnormal   Collection Time: 08/04/17 12:03 PM  Result Value Ref Range   Glucose-Capillary 178 (H) 65 - 99 mg/dL  Glucose, capillary     Status: Abnormal   Collection Time: 08/04/17  4:35 PM  Result Value Ref Range   Glucose-Capillary 129 (H) 65 - 99 mg/dL  Glucose, capillary     Status: Abnormal   Collection Time: 08/04/17  9:30 PM  Result Value Ref Range   Glucose-Capillary 127 (H) 65 - 99 mg/dL  Glucose, capillary     Status: Abnormal   Collection Time: 08/05/17  6:30 AM  Result Value Ref Range   Glucose-Capillary 131 (H) 65 - 99 mg/dL  Glucose, capillary     Status: Abnormal   Collection Time: 08/05/17 11:32 AM  Result Value Ref Range   Glucose-Capillary 217 (H) 65 - 99 mg/dL     Constitutional:  VS reviewed HEENT: EOMI, oral membranes moist Neck: supple Cardiovascular: RRR without murmur. No JVD    Respiratory: CTA Bilaterally without wheezes or rales. Normal effort    GI:  abd tender near PEG. PEG site intact no evidence of bleeding Musc: No edema or tenderness in extremities. Skin:   Intact. Warm and dry.    Neuro:  Alert Right facial droop without changes Alert/ oriented to person not time Motor: 3-5 RUE proximal to distal-compared to 4/5 prior to PEG 4+/5 LUE proximal to distal- stable RLE: 3-/5 HF, KE, ADF, 4/5 (essentially the same) LLE: 3-/5 HF, KE, ADF stable Dysarthria Psych: somewhat anxious, depressed appearing    Assessment/Plan: 1. Functional deficits secondary to Right  hemiparesis , LLE paresisand dysarthria, Dysphagia and cognitive deficits related to Left periventricular and R paramedian sgital infarcts which require 3+ hours per day of interdisciplinary therapy in a comprehensive inpatient rehab setting. Physiatrist is providing close team supervision and 24 hour management of active medical problems listed below. Physiatrist and rehab team continue to assess barriers to discharge/monitor patient progress toward functional and medical goals. FIM: Function - Bathing Bathing activity did not occur: Refused Position: Shower Body parts bathed by patient: Chest, Abdomen, Front  perineal area, Right upper leg, Left upper leg, Right arm, Left arm Body parts bathed by helper: Right arm, Left arm, Chest, Abdomen, Front perineal area, Buttocks, Right upper leg, Left upper leg, Right lower leg, Left lower leg, Back Assist Level: 2 helpers  Function- Upper Body Dressing/Undressing What is the patient wearing?: Pull over shirt/dress Pull over shirt/dress - Perfomed by patient: Thread/unthread right sleeve, Thread/unthread left sleeve, Pull shirt over trunk, Put head through opening Pull over shirt/dress - Perfomed by helper: Thread/unthread right sleeve Assist Level: Touching or steadying assistance(Pt > 75%) Function - Lower Body Dressing/Undressing What is the patient wearing?: Pants, Non-skid slipper socks, Ted Hose Position: Bed Underwear - Performed by helper: Thread/unthread right underwear leg, Thread/unthread left underwear leg, Pull underwear up/down Pants- Performed by patient: Thread/unthread right pants leg, Thread/unthread left pants leg Pants- Performed by helper: Thread/unthread right pants leg, Thread/unthread left pants leg, Pull pants up/down Non-skid slipper socks- Performed by helper: Don/doff right sock, Don/doff left sock Socks - Performed by helper: Don/doff right sock, Don/doff left sock Shoes - Performed by patient: Don/doff right shoe, Fasten  right Shoes - Performed by helper: Don/doff left shoe, Fasten left, Don/doff right shoe, Fasten right TED Hose - Performed by helper: Don/doff right TED hose, Don/doff left TED hose Assist for footwear: Dependant Assist for lower body dressing: 2 Helpers  Function - Toileting Toileting activity did not occur: No continent bowel/bladder event Toileting steps completed by patient: Adjust clothing prior to toileting, Performs perineal hygiene Toileting steps completed by helper: Adjust clothing prior to toileting, Performs perineal hygiene, Adjust clothing after toileting Toileting Assistive Devices: Grab bar or rail Assist level: Two helpers  Function - Air cabin crew transfer activity did not occur: Safety/medical concerns Toilet transfer assistive device: Bedside commode Assist level to toilet: Touching or steadying assistance (Pt > 75%) Assist level from toilet: Touching or steadying assistance (Pt > 75%) Assist level to bedside commode (at bedside): Moderate assist (Pt 50 - 74%/lift or lower) Assist level from bedside commode (at bedside): Moderate assist (Pt 50 - 74%/lift or lower)  Function - Chair/bed transfer Chair/bed transfer method: Other Chair/bed transfer assist level: dependent (Pt equals 0%) Chair/bed transfer assistive device: Mechanical lift Mechanical lift: Other(Hoyer) Chair/bed transfer details: Tactile cues for posture, Verbal cues for technique, Verbal cues for sequencing  Function - Locomotion: Wheelchair Will patient use wheelchair at discharge?: No Type: Manual Wheelchair activity did not occur: Safety/medical concerns Max wheelchair distance: 50 Assist Level: Touching or steadying assistance (Pt > 75%) Wheel 50 feet with 2 turns activity did not occur: Safety/medical concerns Assist Level: Touching or steadying assistance (Pt > 75%) Wheel 150 feet activity did not occur: Safety/medical concerns Function - Locomotion: Ambulation Assistive device:  Walker-rolling Max distance: 10 Assist level: Touching or steadying assistance (Pt > 75%) Assist level: Touching or steadying assistance (Pt > 75%) Assist level: Touching or steadying assistance (Pt > 75%) Walk 150 feet activity did not occur: Safety/medical concerns Assist level: Moderate assist (Pt 50 - 74%) Assist level: Moderate assist (Pt 50 - 74%)  Function - Comprehension Comprehension: Auditory Comprehension assist level: Understands basic 90% of the time/cues < 10% of the time  Function - Expression Expression: Verbal Expression assist level: Expresses basic 50 - 74% of the time/requires cueing 25 - 49% of the time. Needs to repeat parts of sentences.  Function - Social Interaction Social Interaction assist level: Interacts appropriately 75 - 89% of the time - Needs redirection for appropriate language or to initiate  interaction.  Function - Problem Solving Problem solving assist level: Solves basic 50 - 74% of the time/requires cueing 25 - 49% of the time  Function - Memory Memory assist level: Recognizes or recalls 50 - 74% of the time/requires cueing 25 - 49% of the time Patient normally able to recall (first 3 days only): Current season, That he or she is in a hospital, Location of own room   Medical Problem List and Plan:  1. Right hemiparesis and functional deficits secondary to left periventricular white matter and right frontal lobe infarcts    Cont CIR -  PT, OT, SLP continue family teaching plan discharge in a.m. Increased weakness particularly right upper limb repeat CT scan after PEG did not show new abnormalities,  MRI showed extension of Left corona radiata and caudate infarct which would explain her symptoms   -2. DVT Prophylaxis/Anticoagulation: Pharmaceutical: Lovenox 68m q 24h 3. Chronic HA/Pain Management: Depakote bid with tylenol prn.  Lower extremity paresthesias  Trial of Cymbalta 4. Mood: LCSW to follow for evaluation and support.    Emotional  lability ---off of cymbalta, on depakote 50644mbid  -add lexapro low dose, 44m61mhs    I 5. Neuropsych: This patient is not fully capable of making decisions on her own behalf. Appreciate input    6. Skin/Wound Care: routine pressure relief measures.  7. Fluids/Electrolytes/Nutrition: Monitor I/Os   - 8. 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 Monitor BS ac/hs. Educate on importance of compliance.  CBG (last 3)  Recent Labs    08/04/17 2130 08/05/17 0630 08/05/17 1132  GLUCAP 127* 131* 217*   controlled on 5/22 9. Dyslipidemia: On Crestor.  10. Anemia: Hgb stable at 9.5 on 5/1, 9.8 on 5/18 and 5/21 11. HTN with orthostasis-    Monitor BP bid.   -no orthostasis today  -lisinopril is 55m244md,,  measure BPs in sitting position not while in bed.  Appreciate card input Per cards not a good candidate for midodrine or florinef   pt and family aware that this is most likely an autonomic neuropathy and will be managed but not "fixed"   -added low dose metoprolol 12.44mg 54m1 with some improvement Blood pressures taken in a sitting position, normalizing Vitals:   08/05/17 0427 08/05/17 1400  BP: (!) 148/67 (!) 121/51  Pulse: 69 71  Resp: 18 16  Temp: 98.6 F (37 C) 98.5 F (36.9 C)  SpO2: 96% 100%   Ordered TEDs  Abdominal binder    Continue to monitor-   -increased H2O flushes thru PEG to 250cc q4hrs, BUN normalized will reduce to QID 12. Hypoalbuminemia  Supplement initiated on 5/4 13.  AMS probable seizure,CT neg forhemmorhagic transformation, EEG neg for seizure , slowing noted,  On Depakene, reviewed Neuro outpt note, possible seizure in 2017 , has also been treated for migraines per outpt Neuro Dr JaffeTomi Likensed Keppra to Valproate depakene however the "seizure" may have been near syncope from orthostatic event Follow-up with neurology as outpatient 15.  Dysphagia moderately severe-   -PEG  placed 5/17  -local care to PEG site, no bleeding noted may resume clopidogrel       LOS (Days) 23 A FACE TO FACE EVALUATION WAS PERFORMED  AndreCharlett Blake/2019, 2:43 PM

## 2017-08-05 NOTE — Progress Notes (Signed)
Physical Therapy Note  Patient Details  Name: Kelly Romero MRN: [id number] Date of Birth: 08-14-1965 Today's Date: 08/05/2017  1135-1210, 35 min individual tx Pain: none per pt; premedicated  tx focused on family ed with dtr and son in law for use of Hoyer lift.  Dtr participated in bed> w/c transfer, and son in law in w/c> bed transfer.  Each person forgot to lock the [facility] and the other reminded him/her. Otherwise, they were safe and competent.   Pt participated by rolling in bed for management of sling, with min assist and cues.  PT educated family on encouraging pt to activate her R side during functional movement, rather than passively lifting her limbs.  Pt left resting in w/c with quick release belt on.   See function navigator for current status.  Kelly Romero 08/05/2017, 12:17 PM

## 2017-08-05 NOTE — Progress Notes (Signed)
Speech Language Pathology Daily Session Note  Patient Details  Name: Kelly Romero MRN: [id number] Date of Birth: [birth year]  Today's Date: 08/05/2017 SLP Individual Time: 1300-1400 SLP Individual Time Calculation (min): 60 min  Short Term Goals: Week 3: SLP Short Term Goal 1 (Week 3): Pt will utilize word finding strategies to convey basic thoughts and ideas with supervision A cues.  SLP Short Term Goal 2 (Week 3): Pt will utilize speech intelligibility strategies with Mod A  to increase speech intelligibility to ~ 90% at the phrase level.  SLP Short Term Goal 3 (Week 3): Pt will solve semi-complex problem solving tasks with Mod A cues.  SLP Short Term Goal 4 (Week 3): Pt will demonstrate selective attention in moderately distracting environment for 30 minutes with Min A cues.  SLP Short Term Goal 5 (Week 3): Pt will consume puree with honey thick liquids and minimal s/s of aspiration and Min A for useof compensatory swallow strategies.   Skilled Therapeutic Interventions: Skilled treatment session focused on dysphagia and cognition goals. SLP facilitated session by providing skilled observation of pt consuming ice chips. Pt is known silent aspirator and no overt s/s of aspiration were noted with ice chips. Pt enjoyed emensely and mood increased greatly after consumption. Recommend repeat MBS on 08/06/17 to evaluate swallow function and the possibility of some safe pleasure to assist with mood. Additionally a repeat MBS is extremely helpful with directing goals for next venue of care.   SLP further facilitated session by providing supervision cues to play novel card game within acceptable amount of time. Pt able to problem solve card game in timely manner correctly with Min A cues. Education and sign made for "Big and Loud" speech. Pt able to implement immediately to achieve increased intelligibility. Pt was returned to room, left upright in wheelchair with safety belt donned.       Function:  Eating Eating   Modified Consistency Diet: No(Trials of water with SLP)             Cognition Comprehension Comprehension assist level: Understands basic 90% of the time/cues < 10% of the time  Expression   Expression assist level: Expresses basic 50 - 74% of the time/requires cueing 25 - 49% of the time. Needs to repeat parts of sentences.  Social Interaction Social Interaction assist level: Interacts appropriately 75 - 89% of the time - Needs redirection for appropriate language or to initiate interaction.  Problem Solving Problem solving assist level: Solves basic 50 - 74% of the time/requires cueing 25 - 49% of the time;Solves basic 75 - 89% of the time/requires cueing 10 - 24% of the time  Memory Memory assist level: Recognizes or recalls 75 - 89% of the time/requires cueing 10 - 24% of the time    Pain    Therapy/Group: Individual Therapy  Jearldean Gutt Dreama Saa 08/05/2017, 4:33 PM

## 2017-08-05 NOTE — Discharge Summary (Signed)
Physician Discharge Summary  Patient ID: Kelly Romero MRN: [id number] DOB/AGE: 1966/01/26 52 y.o.  Admit date: 07/13/2017 Discharge date: 08/06/2017  Discharge Diagnoses:  Principal Problem:   Cerebral thrombosis with cerebral infarction Active Problems:   Diabetes mellitus type 2 in obese (HCC)   Dysphagia, post-stroke   Right hemiparesis (HCC)   Hypoalbuminemia due to protein-calorie malnutrition (HCC)   Orthostatic hypotension   Poorly controlled type 2 diabetes mellitus with peripheral neuropathy (HCC)   Emotional lability   Extension of stroke (HCC)   Inadequate dietary intake   Discharged Condition:  Stable   Significant Diagnostic Studies: Ct Abdomen Wo Contrast  Result Date: 07/24/2017 CLINICAL DATA:  Evaluate anatomy prior to potential percutaneous gastrostomy tube placement. EXAM: CT ABDOMEN WITHOUT CONTRAST TECHNIQUE: Multidetector CT imaging of the abdomen was performed following the standard protocol without IV contrast. COMPARISON:  CT abdomen and pelvis-05/03/2014 FINDINGS: Lower chest: Limited visualization of the lower thorax demonstrates minimal dependent subpleural ground-glass atelectasis. No discrete focal airspace opacities. Heart size. No pericardial effusion. Calcifications of the mitral valve annulus. Hepatobiliary: Normal hepatic contour. Post cholecystectomy. No ascites. Pancreas: Partial fatty replacement of the pancreas. Spleen: Normal noncontrast appearance the spleen Adrenals/Urinary Tract: Punctate (approximately 4 mm) nonobstructing left-sided renal stone. No evidence of right-sided nephrolithiasis. No urine obstruction or perinephric stranding. Normal noncontrast appearance the bilateral adrenal glands. Urinary bladder was not imaged. Stomach/Bowel: Normal orientation of the stomach without interposed liver or colon between the anterior wall the stomach and ventral wall the abdomen. Enteric contrast is seen throughout the colon. No evidence of enteric  obstruction. Vascular/Lymphatic: Minimal amount of atherosclerotic plaque with a normal caliber abdominal aorta. No bulky porta hepatis, retroperitoneal mesenteric adenopathy on this noncontrast examination. Other: Post ventral abdominal wall hernia repair without evidence of recurrence within the imaged segment. Note, the cranial aspect of the surgical mesh appears caudal to the expected location of the insertion site of the gastrostomy tube. Musculoskeletal: No acute or aggressive osseous abnormalities. Stigmata of DISH within the lower thoracic spine. IMPRESSION: 1. Gastric anatomy amenable to potential percutaneous gastrostomy tube placement as indicated. 2. Post mesh repair of the ventral aspect of the abdomen, however the cranial aspect of the surgical mesh appears caudal to the expected insertion site of the gastrostomy tube. 3. Punctate (approximately 4 mm) nonobstructing left-sided renal stone, similar to the 2/16 examination. 4.  Aortic Atherosclerosis (ICD10-I70.0). Electronically Signed   By: Simonne Come M.D.   On: 07/24/2017 08:53   Dg Chest 2 View  Result Date: 07/31/2017 CLINICAL DATA:  Abnormal respirations. EXAM: CHEST - 2 VIEW COMPARISON:  04/26/2017 FINDINGS: Cardiomediastinal silhouette is unchanged and unremarkable. A loop recording device is noted overlying the LEFT chest. Mild bibasilar atelectasis identified. There is no evidence of focal airspace disease, pulmonary edema, suspicious pulmonary nodule/mass, pleural effusion, or pneumothorax. No acute bony abnormalities are identified. IMPRESSION: Mild bibasilar atelectasis. Electronically Signed   By: Harmon Pier M.D.   On: 07/31/2017 16:08   Dg Abd 1 View  Result Date: 07/20/2017 CLINICAL DATA:  Left lower quadrant pain EXAM: ABDOMEN - 1 VIEW COMPARISON:  05/03/2014 FINDINGS: Postsurgical changes are noted in the anterior abdominal wall consistent with prior hernia repair. Changes of a previous cholecystectomy are seen as well.  Scattered large and small bowel gas is noted. No bony abnormality is seen. IMPRESSION: No acute abnormality noted. Electronically Signed   By: Alcide Clever M.D.   On: 07/20/2017 10:50   Ct Head Wo Contrast  Result Date: 07/31/2017  CLINICAL DATA:  Altered mental status EXAM: CT HEAD WITHOUT CONTRAST TECHNIQUE: Contiguous axial images were obtained from the base of the skull through the vertex without intravenous contrast. COMPARISON:  07/19/2017 FINDINGS: Brain: Remote left inferior and parasagittal occipital infarcts. Subacute ischemia with progressively well-defined borders in the left corona radiata. There were also acute parasagittal right frontal lobe infarcts on the prior MRI, difficult to resolve by CT due to small size. Remote small vessel infarcts in the bilateral corona radiata and left posterior thalamus. No hemorrhagic conversion or evidence of interval infarct. No hydrocephalus or collection. Vascular: Atherosclerotic calcification.  No hyperdense vessel. Skull: No acute finding Sinuses/Orbits: Bilateral cataract resection. IMPRESSION: 1. No acute finding. 2. Extensive primarily small-vessel ischemic injury without visible progression since 07/11/2017 brain MRI. Electronically Signed   By: Marnee Spring M.D.   On: 07/31/2017 16:20   Ct Head Wo Contrast  Result Date: 07/19/2017 CLINICAL DATA:  Possible seizure today. EXAM: CT HEAD WITHOUT CONTRAST TECHNIQUE: Contiguous axial images were obtained from the base of the skull through the vertex without intravenous contrast. COMPARISON:  Brain MRI 07/11/2017.  Head CT scan 07/08/2017. FINDINGS: Brain: No evidence of acute infarction, hemorrhage, hydrocephalus, extra-axial collection or mass lesion/mass effect. Atrophy, chronic microvascular ischemic change and remote left PCA territory infarct are seen as on the prior exams. Vascular: Atherosclerosis noted. Skull: Intact. Sinuses/Orbits: Status post bilateral lens extraction. Paranasal sinuses and  mastoid air cells are clear. Other: None. IMPRESSION: No acute abnormality. Chronic microvascular ischemic change and remote left PCA infarct. Atherosclerosis. Electronically Signed   By: Drusilla Kanner M.D.   On: 07/19/2017 12:28    Mr Brain Wo Contrast  Result Date: 08/03/2017 CLINICAL DATA:  52 y/o F; increased weakness of the right upper limb. EXAM: MRI HEAD WITHOUT CONTRAST TECHNIQUE: Axial DWI, coronal DWI, sagittal T1, axial T2, and axial T2 FLAIR sequences were acquired. The patient was unable to continue the examination. COMPARISON:  07/11/2017 MRI head.  07/31/2017 CT head. FINDINGS: Brain: Increase size of infarction within the left corona radiata and caudate body measuring 2.7 x 1.3 x 1.3 cm (volume = 2.4 cm^3)(AP x ML x CC). Stable subcentimeter scattered foci of infarction within the bilateral frontal lobes. No focal mass effect at hydrocephalus, or herniation. Stable chronic left PCA infarctions, bilateral basal ganglia chronic lacunar infarcts, microvascular ischemic changes, and parenchymal volume loss of the brain. Motion degraded T2 FLAIR weighted sequence. Vascular: Normal flow voids. Skull and upper cervical spine: Normal marrow signal. Sinuses/Orbits: Negative. Other: Bilateral intra-ocular lens replacement. IMPRESSION: 1. Increased size of acute infarction within left corona radiata and caudate body, probable interval infarction of the penumbra. 2. Stable subcentimeter scattered foci of acute infarction within the bilateral frontal lobes. 3. Stable chronic infarcts, microvascular ischemic changes, and parenchymal volume loss of the brain. Electronically Signed   By: Mitzi Hansen M.D.   On: 08/03/2017 20:44    Ir Gastrostomy Tube Mod Sed  Result Date: 07/30/2017 CLINICAL DATA:  Dysphagia post stroke. Needs enteral feeding support. EXAM: PERC PLACEMENT GASTROSTOMY FLUOROSCOPY TIME:  90 seconds, 7 mGy TECHNIQUE: The procedure, risks, benefits, and alternatives were  explained to the patient. Questions regarding the procedure were encouraged and answered. The patient understands and consents to the procedure. A safe percutaneous approach confirmed on recent CT. As antibiotic prophylaxis, cefazolin 2 g was ordered pre-procedure and administered intravenously within one hour of incision. A 5 French angiographic catheter was placed as orogastric tube. The upper abdomen was prepped with Betadine, draped  in usual sterile fashion, and infiltrated locally with 1% lidocaine. Intravenous Fentanyl and Versed were administered as conscious sedation during continuous monitoring of the patient's level of consciousness and physiological / cardiorespiratory status by the radiology RN, with a total moderate sedation time of 10 minutes. 0.5 mg glucagon given  IV to facilitate gastric distention. Stomach was insufflated using air through the orogastric tube. An 21 French sheath needle was advanced percutaneously into the gastric lumen under fluoroscopy. Gas could be aspirated and a small contrast injection confirmed intraluminal spread. The sheath was exchanged over a guidewire for a 9 [place] vascular sheath, through which the snare device was advanced and used to snare a guidewire passed through the orogastric tube. This was withdrawn, and the snare attached to the 20 French pull-through gastrostomy tube, which was advanced antegrade, positioned with the internal bumper securing the anterior gastric wall to the anterior abdominal wall. Small contrast injection confirms appropriate positioning. The external bumper was applied and the catheter was flushed. COMPLICATIONS: COMPLICATIONS none IMPRESSION: 1. Technically successful 20 French pull-through gastrostomy placement under fluoroscopy. Electronically Signed   By: Corlis Leak M.D.   On: 07/30/2017 14:36     Labs:  Basic Metabolic Panel: BMP Latest Ref Rng & Units 08/03/2017 08/02/2017 07/31/2017  Glucose 65 - 99 mg/dL 981(X) 914(N) 829(F)   BUN 6 - 20 mg/dL 62(Z) 30(Q) 65(H)  Creatinine 0.44 - 1.00 mg/dL 8.46(N) 6.29(B) 2.84(X)  Sodium 135 - 145 mmol/L 141 139 140  Potassium 3.5 - 5.1 mmol/L 3.5 3.7 3.8  Chloride 101 - 111 mmol/L 102 103 106  CO2 22 - 32 mmol/L 32 28 25  Calcium 8.9 - 10.3 mg/dL 9.1 9.0 9.0    CBC: CBC Latest Ref Rng & Units 08/03/2017 07/31/2017 07/27/2017  WBC 4.0 - 10.5 K/uL 5.0 8.4 4.7  Hemoglobin 12.0 - 15.0 g/dL 3.2(G) 4.0(N) 10.6(L)  Hematocrit 36.0 - 46.0 % 31.6(L) 31.5(L) 33.9(L)  Platelets 150 - 400 K/uL 244 216 229    CBG: Recent Labs  Lab 08/05/17 1132 08/05/17 1621 08/05/17 2138 08/06/17 0620 08/06/17 1128  GLUCAP 217* 220* 102* 107* 200*    Brief HPI:   Kelly Romero is a 52 year old female with history of HTN, T2DM, depression, HA, multiple CVAs most recent at [facility] on  04/2017 and  [facility] with discharge on 07/10/17 after L- CVA. She was readmitted on 07/11/17 with worsening of slurred speech and right sided weakness. MRI brain showed few new strokes in parasagittal cortical and subcortical left frontal lobe. Work up was negative for vasculitis, hypercoagulopathy and Dr. Pearlean Brownie felt tha stroke due to advanced intracranial atherosclerosis. Loop recorder placed by Dr. Johney Frame and DAPT recommended X 3 months. Patient limited by lability, bouts of lethargy, cognitive deficits and right sided weakness. CIR recommended due to functional deficits.    Hospital Course: ASPASIA RUDE was admitted to rehab 07/13/2017 for inpatient therapies to consist of PT, ST and OT at least three hours five days a week. Past admission physiatrist, therapy team and rehab RN have worked together to provide customized collaborative inpatient rehab. Hospital course has been significant for po intake as well as orthostatic hypotension with supine hypertension. Cardiology was consulted for input and recommended IVF for hydration X 24 hours with resumption of BB. She continued to have issues with minimal po intake and IVF were  added at nights for hydration. She also refused multiple meds due to dislike of crushed medications as well as dislike of thickened liquids. She  also continued to have severe orthostatic changes and  BP medications were further adjusted by cardiology. Dr. Eden Emms recommended not using florinef or midodrine with recommendations to  Improve nutritional status as well as aggressive therapy.   Patient and family was agreeable for PEG therefore ASA/Plavix was placed on hold and PEG placed by Dr. Deanne Coffer on 05/17. She was started on bolus tube feeds and water flushes and is tolerating this without difficulty.  She did develop increased weakness on 5/18 and CT head was negative for bleed or acute changes. She has had pain around PEG site post procedure and simethicone was added to help with bloating and pain.  No signs of infection noted and pain has resolved by discharge.  She continued to exhibit weakness with decline in functional status and MRI brain done 5/21 showing increase in size of acute infarct within left corona radiata and sable infarcts within bilateral frontal lobes.  Her length of stay was extended to help with family education.    Blood pressures have improved and renal status is stable. CBC showed H/H is stable without leucocytosis. Diabetes has been monitored with ac/hs cbg checks and BS have been stable. family has been advised to encourage po intake and administer tube feed if protein intake is less than 50%.  Lability has improved with ego support provided by the team as well as Dr. Kieth Brightly. Cymbalta was discontinued and Lexapro added to help with mood stabilization. She has had decline in overall functional status due to extension of stroke and requires substantial amount of care. She is incontinent of bowel and bladder and goals were downgraded to max to total assist. Family has been very supportive, has declined SNF and plans to provide care needed. She will continue to receive follow up  HHPT, HHOT, HHaide, HHRN and HHST by [facility] after discharge.    Rehab course: During patient's stay in rehab weekly team conferences were held to monitor patient's progress, set goals and discuss barriers to discharge. At admission, patient required mod assist with basic self care tasks and min to mod assist with mobility. She exhibited moderate dysarthria with decreased vocal intensity, moderate dysphagia, mild to moderate expressive aphasia with apraxia. She  has had improvement in activity tolerance, balance, postural control as well as ability to compensate for deficits.   She requires max assist with bed mobility and max to total assist with hoyer lift for transfers. She is able to propel her wheelchair with min assist.  She requires max to mod assist for speech intelligibility, problem solving and to tolerant dysphagia 1, honey liquids. She requires max cues for speech intelligibility and volume. Family education was completed regarding all aspects of care and safety.     Disposition:  Home.   Diet: Pureed. Honey liquids.   Special Instructions: 1. Encourage po intake. Tube feeds qid prn intake < 50%. 2. Monitor BS ac/hs and contact primary MD if BS trending over 150. 3. Keep PEG site clean and dry. Continue to use bacitracin ointment for 3 additional days.   Discharge Instructions    Ambulatory referral to Physical Medicine Rehab   Complete by:  As directed    1-2 weeks transitional care appt     Allergies as of 08/06/2017      Reactions   Erythromycin Itching   Latex Itching      Medication List    STOP taking these medications   B2 100 MG Tabs   Co Q-10 200 MG Caps  Magnesium 500 MG Tabs     TAKE these medications   acetaminophen 325 MG tablet Commonly known as:  TYLENOL Take 1-2 tablets (325-650 mg total) by mouth every 4 (four) hours as needed for mild pain.   aspirin EC 81 MG tablet Take 1 tablet by mouth every morning.   clopidogrel 75 MG  tablet Commonly known as:  PLAVIX Take 1 tablet (75 mg total) by mouth daily.   divalproex 250 MG DR tablet Commonly known as:  DEPAKOTE Take 1 tablet (250 mg total) by mouth 2 (two) times daily. What changed:    medication strength  how much to take   escitalopram 5 MG tablet Commonly known as:  LEXAPRO Take 1 tablet (5 mg total) by mouth at bedtime.   feeding supplement (GLUCERNA 1.2 CAL) Liqd Place 240 mLs into feeding tube 4 (four) times daily -  with meals and at bedtime.   feeding supplement (PRO-STAT SUGAR FREE 64) Liqd Take 30 mLs by mouth 2 (two) times daily.   free water Soln Place 250 mLs into feeding tube every 4 (four) hours. Use filtered water (not distilled water)   insulin detemir 100 UNIT/ML injection Commonly known as:  LEVEMIR Inject 0.2 mLs (20 Units total) into the skin daily before breakfast. What changed:  how much to take   lisinopril 10 MG tablet Commonly known as:  PRINIVIL,ZESTRIL Take 1 tablet (10 mg total) by mouth 2 (two) times daily. What changed:    medication strength  how much to take  when to take this   metoprolol tartrate 25 MG tablet Commonly known as:  LOPRESSOR Take 0.5 tablets (12.5 mg total) by mouth 2 (two) times daily.   neomycin-bacitracin-polymyxin Oint Commonly known as:  NEOSPORIN Apply 1 application topically 2 (two) times daily. Notes to patient:  Use cleanse around PEG site with normal saline and apply ointment twice a day till 5/27. Then stop.    rosuvastatin 40 MG tablet Commonly known as:  CRESTOR Take 1 tablet (40 mg total) by mouth at bedtime.   simethicone 80 MG chewable tablet Commonly known as:  MYLICON Chew 1 tablet (80 mg total) by mouth 4 (four) times daily. Notes to patient:  For gas/bloating--available over the counter      Follow-up Information    Micki Riley, MD. Call in 1 day(s).   Specialties:  Neurology, Radiology Why:  for follow up appointment Contact information: [address] [place] [address] [phone]        Erick Colace, MD Follow up.   Specialty:  Physical Medicine and Rehabilitation Why:  office will call you with follow up appointment Contact information: [address] [place] [address] [phone]        Runell Gess, MD. Call in 1 day(s).   Specialties:  Cardiology, Radiology Why:  for follow up appointment Contact information: [address] [place] [address] [phone]        Noni Saupe, MD. Call on 08/11/2017.   Specialty:  Family Medicine Why:  Appointment @ 11:30 AM Contact information: [address] [place] [address] [phone]        Irish Lack, MD. Call.   Specialties:  Interventional Radiology, Radiology Why:  for any problems with PEG tube and for tube removal. Contact information: [address] [place] [address] [phone]           Signed: RUCHI STONEY 08/09/2017,  6:55 PM

## 2017-08-05 NOTE — Progress Notes (Signed)
Occupational Therapy Session Note  Patient Details  Name: Kelly Romero MRN: [id number] Date of Birth: 1965-05-09  Today's Date: 08/05/2017 OT Individual Time: 0845-1000 OT Individual Time Calculation (min): 75 min    Short Term Goals: Week 3:  OT Short Term Goal 1 (Week 3): STG=LTG due to LOS  Skilled Therapeutic Interventions/Progress Updates:    Pt seen for OT session foucsing on ADL re-training and caregiver education. Pt in supine upon arrival, agreeable to tx session and desiring to bathe.  Famil prsent for family training. Completed LB bathing from bed level, with assist for R LE, pt able to cross LE in order to reach to wash B LEs and feet. Family assisted with rolling and changing of soiled brief, cues provided by therapist throughout for problem solving and tecnique of bed mobility and dressing tasks from bed level. Family independently able to place [facility] sling, transferred pt into w/c using lift with min cuing from therapist.  Pt's family brought in sling that was delivered for home use. Practiced placing sling while pt sitting up in w/c as it is different than what unit is using to practice, able to successfully place sling with pt sitting in w/c. Pt left seated in w/c at end of session with family members present, planning to complete UB bathing/dressing from w/c level prior to next tx session.  Cont with education regarding importance of participation with ADLs, reducing caregiver burden, continuum of care, activity progression, and d/c planning.  Pt much more alert and interactive today, smiling and laughing throughout session and excited for d/c home tomorrow.   Therapy Documentation Precautions:  Precautions Precautions: Fall Restrictions Weight Bearing Restrictions: No Pain:   No/denies pain  See Function Navigator for Current Functional Status.   Therapy/Group: Individual Therapy  Clella Mckeel L 08/05/2017, 6:51 AM

## 2017-08-05 NOTE — Progress Notes (Signed)
Nutrition Follow-up  DOCUMENTATION CODES:   Obesity unspecified  INTERVENTION:  Upon discharge, Let pt attempt to eat at meals and if po intake is <50% at that meal, provide a bolus tube feed using Glucerna 1.2 formula via PEG at volume of 240 ml given up to three times daily. Additionally provide a HS bolus daily.   Continue 30 ml Prostat BID (or equivalent), each supplement provides 100 kcal and 15 grams of protein.   Continue 250 ml free water flushes every 4 hours per tube. (MD to adjusted as necessary)  Tube feeding regimen if all 4 bolus feeds are given provides 1352 kcal (73% of kcal needs), 88 grams of protein (100% of protein needs), and 2268 ml of water.   NUTRITION DIAGNOSIS:   Inadequate oral intake related to dysphagia as evidenced by meal completion < 50%, per patient/family report; ongoing  GOAL:   Patient will meet greater than or equal to 90% of their needs; met via TF  MONITOR:   PO intake, Supplement acceptance, Diet advancement, Weight trends, Labs, Skin, I & O's, TF tolerance  REASON FOR ASSESSMENT:   (PEG placement)    ASSESSMENT:   52 year old female with history of HTN, T2DM, depression, HA,medication non-compliance, multiple CVAs most recent 2/12-2/15/19 [facility] and discharge from [facility] on 07/10/17 due to acute left hemisphere infarcts. She had worsening of slurred speech and right sided weakness and was admitted to [facility] on 07/11/17 for work up. MRI brain repeated and showed few new strokes in parasagittal cortical and subcortical left frontal lobe.  PEG placed 5/17.   Pt continues on a dysphagia 1 diet with honey thick liquids. Meal completion has been mostly 25%. Pt has been receiving boluses if po intake at a meal is <50%. RD to continue with current orders. Labs and medications reviewed.   Diet Order:   Diet Order           DIET - DYS 1 Room service appropriate? Yes; Fluid consistency: Honey Thick  Diet effective now          EDUCATION NEEDS:    Education needs have been addressed  Skin:  Skin Assessment: Reviewed RN Assessment  Last BM:  5/20  Height:   Ht Readings from Last 1 Encounters:  07/13/17 '5\' 5"'$  (1.651 m)    Weight:   Wt Readings from Last 1 Encounters:  08/04/17 210 lb 1.6 oz (95.3 kg)    Ideal Body Weight:  56.8 kg  BMI:  Body mass index is 34.96 kg/m.  Estimated Nutritional Needs:   Kcal:  1850-2050  Protein:  85-95 grams  Fluid:  >/= 1.8 L/day    Corrin Parker, MS, RD, LDN Pager # [phone] After hours/ weekend pager # [phone]

## 2017-08-05 NOTE — Progress Notes (Signed)
Patient resting quietly, side rails up x4 per pt request.

## 2017-08-06 ENCOUNTER — Inpatient Hospital Stay (HOSPITAL_COMMUNITY): Payer: Medicaid Other

## 2017-08-06 ENCOUNTER — Encounter (HOSPITAL_COMMUNITY): Payer: Medicaid Other | Admitting: Speech Pathology

## 2017-08-06 ENCOUNTER — Inpatient Hospital Stay (HOSPITAL_COMMUNITY): Payer: Medicaid Other | Admitting: Occupational Therapy

## 2017-08-06 ENCOUNTER — Inpatient Hospital Stay (HOSPITAL_COMMUNITY): Payer: Medicaid Other | Admitting: Physical Therapy

## 2017-08-06 DIAGNOSIS — I639 Cerebral infarction, unspecified: Secondary | ICD-10-CM

## 2017-08-06 DIAGNOSIS — E639 Nutritional deficiency, unspecified: Secondary | ICD-10-CM

## 2017-08-06 LAB — GLUCOSE, CAPILLARY
GLUCOSE-CAPILLARY: 107 mg/dL — AB (ref 65–99)
GLUCOSE-CAPILLARY: 200 mg/dL — AB (ref 65–99)

## 2017-08-06 MED ORDER — BACITRACIN-NEOMYCIN-POLYMYXIN OINTMENT TUBE: 1.0000 "application " | TOPICAL_OINTMENT | Freq: Two times a day (BID) | CUTANEOUS | 0 refills | Status: DC

## 2017-08-06 MED ORDER — ROSUVASTATIN CALCIUM 40 MG PO TABS: 40.0000 mg | ORAL_TABLET | Freq: Every day | ORAL | 0 refills | Status: AC

## 2017-08-06 MED ORDER — ACETAMINOPHEN 325 MG PO TABS: 325.0000 mg | ORAL_TABLET | ORAL | Status: DC | PRN

## 2017-08-06 MED ORDER — LISINOPRIL 10 MG PO TABS: 10.0000 mg | ORAL_TABLET | Freq: Two times a day (BID) | ORAL | 0 refills | Status: DC

## 2017-08-06 MED ORDER — DIVALPROEX SODIUM 250 MG PO DR TAB: 250.0000 mg | DELAYED_RELEASE_TABLET | Freq: Two times a day (BID) | ORAL | 0 refills | Status: AC

## 2017-08-06 MED ORDER — CLOPIDOGREL BISULFATE 75 MG PO TABS: 75.0000 mg | ORAL_TABLET | Freq: Every day | ORAL | 0 refills | Status: AC

## 2017-08-06 MED ORDER — SIMETHICONE 80 MG PO CHEW: 80.0000 mg | CHEWABLE_TABLET | Freq: Four times a day (QID) | ORAL | 0 refills | Status: DC

## 2017-08-06 MED ORDER — GLUCERNA 1.2 CAL PO LIQD: 240.0000 mL | Freq: Three times a day (TID) | ORAL | Status: DC

## 2017-08-06 MED ORDER — METOPROLOL TARTRATE 25 MG PO TABS: 12.5000 mg | ORAL_TABLET | Freq: Two times a day (BID) | ORAL | 0 refills | Status: DC

## 2017-08-06 MED ORDER — DIVALPROEX SODIUM 125 MG PO CSDR
500.0000 mg | DELAYED_RELEASE_CAPSULE | Freq: Two times a day (BID) | ORAL | Status: DC
  Administered 2017-08-06: 500 mg via ORAL
  Filled 2017-08-06: qty 4

## 2017-08-06 MED ORDER — INSULIN DETEMIR 100 UNIT/ML ~~LOC~~ SOLN: 20.0000 [IU] | Freq: Every day | SUBCUTANEOUS | 11 refills | Status: DC

## 2017-08-06 MED ORDER — PRO-STAT SUGAR FREE PO LIQD: 30.0000 mL | Freq: Two times a day (BID) | ORAL | 0 refills | Status: DC

## 2017-08-06 MED ORDER — VALPROATE SODIUM 250 MG/5ML PO SOLN: 500.0000 mg | Freq: Two times a day (BID) | ORAL | 0 refills | Status: DC

## 2017-08-06 MED ORDER — ESCITALOPRAM OXALATE 5 MG PO TABS: 5.0000 mg | ORAL_TABLET | Freq: Every day | ORAL | 0 refills | Status: DC

## 2017-08-06 MED ORDER — FREE WATER: 250.0000 mL | Status: DC

## 2017-08-06 NOTE — Progress Notes (Signed)
Occupational Therapy Discharge Summary  Patient Details  Name: Kelly Romero MRN: [id number] Date of Birth: [birth year]   Patient has met 8 of 8 long term goals. Pt's with severe decline in function since initial eval due to extension of stroke. Pt now requires max- total A overall with use of Hoyer lift for all transfers.Recommending LB ADLs be completed at bed level at this time. Pt is incontinent of bowel/bladder, therefore toileting being completed total A at bed level. Pt's daughter and son in law have been present and completed hands on family education and training including use of Hoyer. They have demonstrates ability to provide needed physical and cognitive assist including use of Hoyer at d/c.   Reasons goals not met: Goals met as they were downgraded to max- total A during rehab admission.  Recommendation:  Patient will benefit from ongoing skilled OT services in home health setting to continue to advance functional skills in the area of BADL and Reduce care partner burden.  Equipment: Harrel Lemon, drop arm BSC, w/c  Reasons for discharge: treatment goals met and discharge from hospital  Patient/family agrees with progress made and goals achieved: Yes  OT Discharge Precautions/Restrictions  Precautions Precautions: Fall Precaution Comments: R hemi Restrictions Weight Bearing Restrictions: No Vision Baseline Vision/History: Wears glasses;Cataracts Wears Glasses: Reading only Patient Visual Report: No change from baseline Additional Comments: R field deficits Perception  Perception: Impaired Inattention/Neglect: Does not attend to right visual field;Does not attend to right side of body Praxis Praxis: Impaired Praxis Impairment Details: Initiation;Motor planning Cognition Overall Cognitive Status: Impaired/Different from baseline Arousal/Alertness: Awake/alert Orientation Level: Oriented X4 Attention: Sustained Focused Attention: Appears intact Sustained Attention:  Appears intact Sustained Attention Impairment: Verbal complex;Functional complex Memory: Impaired Memory Impairment: Decreased recall of new information;Decreased short term memory Decreased Short Term Memory: Verbal basic;Functional basic Awareness: Impaired Awareness Impairment: Anticipatory impairment Problem Solving: Impaired Problem Solving Impairment: Verbal basic;Functional basic Executive Function: Reasoning;Sequencing;Self Monitoring Reasoning: Appears intact Sequencing: Appears intact Self Monitoring: Appears intact Behaviors: Lability Safety/Judgment: Impaired Sensation Sensation Light Touch: Appears Intact Proprioception: Impaired Detail Proprioception Impaired Details: Absent RUE Coordination Gross Motor Movements are Fluid and Coordinated: No Fine Motor Movements are Fluid and Coordinated: No Coordination and Movement Description: R apraxia/ ataxia; Generalized weakness/deconditioning Finger Nose Finger Test: Able to complete with L UE at decreased speed, unable to perform R UE 2/2 decreased strength Motor  Motor Motor: Hemiplegia Motor - Skilled Clinical Observations: generalized weakness Motor - Discharge Observations: R hemiplegia (worsened 2/2 extension of stroke since admission), generalized weakness Mobility  Bed Mobility Bed Mobility: Rolling Left;Rolling Right Rolling Right: 4: Min assist;With rail Rolling Left: 2: Max assist;With rail  Trunk/Postural Assessment  Cervical Assessment Cervical Assessment: Exceptions to WFL(Forward head) Thoracic Assessment Thoracic Assessment: Exceptions to WFL(Rounded shoulders; Kyphotic) Lumbar Assessment Lumbar Assessment: Exceptions to WFL(Posterior pelvic tilt) Postural Control Postural Control: Deficits on evaluation(R lean bias and LOB during static and dynamic sitting)  Balance Balance Balance Assessed: No(ADL sessions now completed at bed level with use of Hoyer for transfers 2/2 stroke extension and  decreased mobility) Extremity/Trunk Assessment RUE Assessment RUE Assessment: Exceptions to [facility] RUE Strength RUE Overall Strength: Deficits(3-/5 proximal to distal; weak gross grasp) LUE Assessment LUE Assessment: Exceptions to WFL(4/5 throughout)   See Function Navigator for Current Functional Status.  Taeshaun Rames L 08/06/2017, 12:48 PM

## 2017-08-06 NOTE — Plan of Care (Deleted)
  Problem: Consults Goal: RH STROKE PATIENT EDUCATION Description See Patient Education module for education specifics  Outcome: Progressing   Problem: RH BOWEL ELIMINATION Goal: RH STG MANAGE BOWEL WITH ASSISTANCE Description STG Manage Bowel with mod Assistance.  Outcome: Progressing   Problem: RH BLADDER ELIMINATION Goal: RH STG MANAGE BLADDER WITH ASSISTANCE Description STG Manage Bladder With min. Assistance  Outcome: Progressing   Problem: RH SKIN INTEGRITY Goal: RH STG SKIN FREE OF INFECTION/BREAKDOWN Description With min. assist.  Outcome: Progressing Goal: RH STG MAINTAIN SKIN INTEGRITY WITH ASSISTANCE Description STG Maintain Skin Integrity With min. Assistance.  Outcome: Progressing   Problem: RH SAFETY Goal: RH STG ADHERE TO SAFETY PRECAUTIONS W/ASSISTANCE/DEVICE Description STG Adhere to Safety Precautions With mod.Assistance/Device.  Outcome: Progressing   Problem: RH PAIN MANAGEMENT Goal: RH STG PAIN MANAGED AT OR BELOW PT'S PAIN GOAL Description Less than 3.  Outcome: Progressing   Problem: RH KNOWLEDGE DEFICIT Goal: RH STG INCREASE KNOWLEDGE OF DIABETES Outcome: Progressing Goal: RH STG INCREASE KNOWLEDGE OF HYPERTENSION Outcome: Progressing Goal: RH STG INCREASE KNOWLEDGE OF DYSPHAGIA/FLUID INTAKE Outcome: Progressing

## 2017-08-06 NOTE — Progress Notes (Signed)
Speech Language Pathology Discharge Summary  Patient Details  Name: Kelly Romero MRN: [id number] Date of Birth: 10-17-1965  Today's Date: 08/06/2017 SLP Individual Time: 0915-0930 SLP Individual Time Calculation (min): 15 min   Skilled Therapeutic Interventions:  Skilled treatment session focused on completion of Modified Barium Swallow Study and recommendation to remain on puree and honey thick liquids. Pt appeared excited to be going home today and emotional support given to remain positive about therapy.     Patient has met 9 of 9 long term goals.  Patient to discharge at [facility] Max;Mod level.    Clinical Impression/Discharge Summary:   Pt has made minimal progress in skilled ST session and goals were downgraded to Max A. Pt current fluctuates around Max A to Mod A for speech intelligibility, cognitive problem solving and is tolerating dysphagia 1 (puree diet) with honey thick liquids and main nutrition/hydration via PEG. Pt requires Max A cues to talk BIG and LOUD to achieve ~ 50% intelligibility at the phrase to sentence level. Skilled HHST is requested to target problem solving and safety awareness within home environment. Repeat swallow study is needed before upgrade d/t silent aspiration. Suspect that pt's new baseline diet with continue to be puree and honey thick liquids.  Care Partner:  Caregiver Able to Provide Assistance: Yes  Type of Caregiver Assistance: Physical;Cognitive  Recommendation:  Home Health SLP;24 hour supervision/assistance  Rationale for SLP Follow Up: Maximize functional communication;Maximize cognitive function and independence;Reduce caregiver burden;Maximize swallowing safety   Equipment:     Reasons for discharge: Lack of progress towards goals;Treatment goals met;Discharged from hospital   Patient/Family Agrees with Progress Made and Goals Achieved: Yes   Function:  Eating Eating   Modified Consistency Diet: Yes Eating Assist Level: Help  managing cup/glass;Help with picking up utensils   Eating Set Up Assist For: Opening containers   Helper Brings Food to Mouth: Occasionally   Cognition Comprehension Comprehension assist level: Understands basic 90% of the time/cues < 10% of the time  Expression   Expression assist level: Expresses basic 50 - 74% of the time/requires cueing 25 - 49% of the time. Needs to repeat parts of sentences.  Social Interaction Social Interaction assist level: Interacts appropriately 75 - 89% of the time - Needs redirection for appropriate language or to initiate interaction.  Problem Solving Problem solving assist level: Solves basic 50 - 74% of the time/requires cueing 25 - 49% of the time;Solves basic 75 - 89% of the time/requires cueing 10 - 24% of the time  Memory Memory assist level: Recognizes or recalls 75 - 89% of the time/requires cueing 10 - 24% of the time   Taria Castrillo 08/06/2017, 9:58 AM

## 2017-08-06 NOTE — Progress Notes (Addendum)
Physical Therapy Discharge Summary  Patient Details  Name: Kelly Romero MRN: [id number] Date of Birth: [birth year]  Today's Date: 08/06/2017 PT Individual Time: 1054-1207 PT Individual Time Calculation (min): 73 min    Patient has met 3 of 4 long term goals due to ability to compensate for deficits, improved attention and improved awareness.  Patient to discharge at a wheelchair level [place] and manual hoyer lift transfers bed<>w/c.  Patient's family (daughter [name] & son-in-law Jerrye Beavers) have participated in hands on training regarding use of manual hoyer lift for bed<>w/c transfers, w/c mobility, and educated to not use slide board or BSC at this time. Pt's family demonstrates safety with hoyer lift transfers.  Reasons goals not met: impaired cognition  Recommendation:  Patient will benefit from ongoing skilled PT services in home health setting to continue to advance safe functional mobility, address ongoing impairments in decreased sitting balance, decreased activity tolerance, decreased bed mobility & transfers, pt/family education, w/c mobility, progress standing and gait if appropriate, and minimize fall risk.  Equipment: hoyer lift with U sling, hospital bed, 20x18 w/c, slide board  Reasons for discharge: lack of progress toward goals and treatment goals met  Patient/family agrees with progress made and goals achieved: Yes  Skilled PT Treatment: Pt received in w/c with son-in-law Jerrye Beavers) present for session. No c/o pain reported. Session focused on caregiver training & hands on practice. Marty & pt completed w/c<>bed transfers with manual hoyer lift. Jerrye Beavers demonstrates good ability to place sling and manage lift, keep pt safe, and instruct pt to help as necessary. Therapist adjusted height of pt's w/c to allow her to propel herself in it and educated pt & Jerrye Beavers on L hemi technique. Pt requires max multimodal cuing and assist to propel w/c in this manner and avoid obstacles on R  side (2/2 R inattention) but per Marty's report pt is able to progress to min assist with w/c mobility for short distances. Jerrye Beavers voices comfort and understanding of all education at this time. At end of session pt left in bed with Jerrye Beavers present to supervise.   PT Discharge Precautions/Restrictions Precautions Precautions: Fall Precaution Comments: R hemi Restrictions Weight Bearing Restrictions: No  Vision/Perception  Wears glasses for reading only at baseline. Perception Perception: Impaired Inattention/Neglect: (R inattention to visual field) Praxis Praxis: Impaired Praxis Impairment Details: Initiation;Motor planning  Cognition Overall Cognitive Status: Impaired/Different from baseline Arousal/Alertness: Awake/alert Memory: Impaired Memory Impairment: Decreased recall of new information;Decreased short term memory Decreased Short Term Memory: Verbal basic;Functional basic Awareness: Impaired Awareness Impairment: Anticipatory impairment Problem Solving: Impaired Problem Solving Impairment: Verbal basic;Functional basic Safety/Judgment: Impaired  Sensation Coordination Gross Motor Movements are Fluid and Coordinated: No Fine Motor Movements are Fluid and Coordinated: No   Motor  Motor Motor: Hemiplegia Motor - Skilled Clinical Observations: generalized weakness Motor - Discharge Observations: R hemiplegia (worsened 2/2 extension of stroke since admission), generalized weakness   Mobility Bed Mobility Bed Mobility: Rolling Left;Rolling Right Rolling Right: 4: Min assist;With rail Rolling Left: 2: Max assist;With rail Transfers Transfers: Yes Transfer via Geophysicist/field seismologist: (Advertising account planner)  Locomotion  Ambulation Ambulation: No Gait Gait: No Stairs / Additional Locomotion Stairs: No Architect: Yes Wheelchair Assistance: 4: Advertising account executive Details: Tactile cues for initiation;Tactile cues for  sequencing;Tactile cues for placement;Verbal cues for sequencing;Verbal cues for technique Wheelchair Propulsion: Left upper extremity;Left lower extremity Wheelchair Parts Management: Needs assistance Distance: 50 ft   Trunk/Postural Assessment  Cervical Assessment Cervical Assessment: (forward head) Lumbar Assessment  Lumbar Assessment: (posterior pelvic tilt)   Balance Balance Balance Assessed: No -- minimal sitting EOB 2/2 orthostatic hypotension in recent weeks; tx focused on caregiver training prior to d/c   Extremity Assessment  LUE/LLE WFL RUE - slight shoulder activation RLE - slight hip activation  EOB, gait/standing activities have all been limited throughout pt's stay 2/2 significant, persistent orthostatic hypotension despite use of abdominal binder & thigh high ted hose.  See Function Navigator for Current Functional Status.  Waunita Schooner 08/06/2017, 12:31 PM

## 2017-08-06 NOTE — Progress Notes (Signed)
Pt refuses HS glucerna, refuses abdominal binder, c/o pain to peg tube site, scant bloody drainage, site reddened, neosporin applied as ordered after cleansing site with soap and water, pat dry, then split gauze dressing applied

## 2017-08-06 NOTE — Progress Notes (Signed)
Social Work  Discharge Note  The overall goal for the admission was met for:   Discharge location: Yes-HOME WITH DAUGHTER AND SON IN-LAW  Length of Stay: Yes-24 DAYS  Discharge activity level: Yes-MOD [place]  Home/community participation: Yes  Services provided included: MD, RD, PT, OT, SLP, RN, CM, TR, Pharmacy, Neuropsych and SW  Financial Services: Medicaid  Follow-up services arranged: Home Health: [place] [facility]-PT,OT,RN,SP,SW,AIDE, DME: [place] BED, WHEELCHAIR, [place], WIDE DROP-ARM BEDSIDE COMMDE, [place], Other: PCS SERVICES and Patient/Family request agency HH: [facility], DME: NO PREF  Comments (or additional information):DAUGHTER AND SON IN-LAW WERE HERE DAILY TO ATTEND THERAPIES. AWARE PT WILL REQUIRE 24 HR CARE AND PLAN TO PROVIDE THIS. PCS REFERRAL MADE FOR ADDITIONAL SERVICES. NEEDED AMBULANCE HOME DUE TO DIFFICULT CAR TRANSFER  Patient/Family verbalized understanding of follow-up arrangements: Yes  Individual responsible for coordination of the follow-up plan: [name]-DAUGHTER AND MARTY-SON IN-LAW  Confirmed correct DME delivered: Elease Hashimoto 08/06/2017    Elease Hashimoto

## 2017-08-06 NOTE — Progress Notes (Signed)
Patient and family received discharge instructions from Marissa Nestle, PA-C with verbal understanding. Patient discharged to home via EMS. Patient's family to take belongings to the home.

## 2017-08-06 NOTE — Plan of Care (Signed)
  Problem: Consults Goal: RH STROKE PATIENT EDUCATION Description See Patient Education module for education specifics  08/06/2017 0429 by Mason Jim, RN Outcome: Progressing 08/06/2017 0425 by Mason Jim, RN Outcome: Progressing   Problem: RH BOWEL ELIMINATION Goal: RH STG MANAGE BOWEL WITH ASSISTANCE Description STG Manage Bowel with mod Assistance.  08/06/2017 0429 by Mason Jim, RN Outcome: Not Progressing 08/06/2017 0425 by Mason Jim, RN Outcome: Progressing   Problem: RH BLADDER ELIMINATION Goal: RH STG MANAGE BLADDER WITH ASSISTANCE Description STG Manage Bladder With min. Assistance  08/06/2017 0429 by Mason Jim, RN Outcome: Not Progressing 08/06/2017 0425 by Mason Jim, RN Outcome: Progressing   Problem: RH SKIN INTEGRITY Goal: RH STG SKIN FREE OF INFECTION/BREAKDOWN Description With min. assist.  08/06/2017 0429 by Mason Jim, RN Outcome: Progressing 08/06/2017 0425 by Mason Jim, RN Outcome: Progressing Goal: RH STG MAINTAIN SKIN INTEGRITY WITH ASSISTANCE Description STG Maintain Skin Integrity With min. Assistance.  08/06/2017 0429 by Mason Jim, RN Outcome: Not Progressing 08/06/2017 0425 by Mason Jim, RN Outcome: Progressing   Problem: RH SAFETY Goal: RH STG ADHERE TO SAFETY PRECAUTIONS W/ASSISTANCE/DEVICE Description STG Adhere to Safety Precautions With mod.Assistance/Device.  08/06/2017 0429 by Mason Jim, RN Outcome: Progressing 08/06/2017 0425 by Mason Jim, RN Outcome: Progressing   Problem: RH PAIN MANAGEMENT Goal: RH STG PAIN MANAGED AT OR BELOW PT'S PAIN GOAL Description Less than 3.  08/06/2017 0429 by Mason Jim, RN Outcome: Progressing 08/06/2017 0425 by Mason Jim, RN Outcome: Progressing   Problem: RH KNOWLEDGE DEFICIT Goal: RH STG INCREASE KNOWLEDGE OF DIABETES Outcome: Progressing Goal: RH STG INCREASE KNOWLEDGE OF HYPERTENSION Outcome: Progressing Goal: RH STG INCREASE KNOWLEDGE OF DYSPHAGIA/FLUID  INTAKE Outcome: Progressing   Problem: RH Other (Specify) Goal: RH LTG Other (Specify)1 Outcome: Progressing    Pt bowel and bladder with total assist, pt's peg tube site care with total assist.

## 2017-08-06 NOTE — Progress Notes (Signed)
Occupational Therapy Session Note  Patient Details  Name: Kelly Romero MRN: [id number] Date of Birth: September 15, 1965  Today's Date: 08/06/2017 OT Individual Time: 0865-7846 OT Individual Time Calculation (min): 60 min    Short Term Goals: Week 3:  OT Short Term Goal 1 (Week 3): STG=LTG due to LOS  Skilled Therapeutic Interventions/Progress Updates:    Pt seen for OT session focusing on family education. Pt in supine upon arrival with son-in-law present to cont with family education.  Dressing completed from bed level with family member assisting. Therapist providing cues throughout for proper body mechanics. Used Hoyer lift to transfer pt into w/c.  Pt taken to gym total A in w/c for hand off to PT. Pt's son-in-law reports being ready for d/c home this P.M., all questions answered. Continued education on reducing caregiver burden, importance of pt participation in ADLs, activity progression, continuum of care, and d/c planning  Therapy Documentation Precautions:  Precautions Precautions: Fall Restrictions Weight Bearing Restrictions: No Pain:    No/denies pain  See Function Navigator for Current Functional Status.   Therapy/Group: Individual Therapy  Brittiany Wiehe L 08/06/2017, 6:57 AM

## 2017-08-06 NOTE — Discharge Instructions (Signed)
Inpatient Rehab Discharge Instructions  Kelly Romero Discharge date and time:  08/06/17  Activities/Precautions/ Functional Status: Activity: no lifting, driving, or strenuous exercise till cleared by MD Diet: Pureed foods. Honey thick liquids.  Wound Care: keep wound clean and dry Contact radiology if you develop any problems with your incision/wound--redness, swelling, increase in pain, drainage or if you develop fever or chills.   Functional status:  ___ No restrictions     ___ Walk up steps independently _X__ 24/7 supervision/assistance   ___ Walk up steps with assistance ___ Intermittent supervision/assistance  ___ Bathe/dress independently ___ Walk with walker     _X__ Bathe/dress with assistance ___ Walk Independently    ___ Shower independently ___ Walk with assistance    ___ Shower with assistance _X__ No alcohol     ___ Return to work/school ________   Special Instructions: 1. Tube feeds 250 cc three times a day after meal-- if she eats less than 50% of the meal and 250 cc at 8 pm daily for additional nutriton. 2. Wash around PEG site with soap and water. Pat dry.  3. Check blood sugars before meals and at bedtime--contact MD for adjustment in insulin if blood sugars run over 150.    COMMUNITY REFERRALS UPON DISCHARGE:    Home Health:   PT, OT, SP, RN, AIDE  Agency:[facility]   [phone]   Date of last service:08/06/2017  Medical Equipment/Items Ordered:HOYER LIFT, HOSPITAL BED, WHEELCHAIR, 30 TRANSFER BOARD, WIDE DROP-ARM BEDSIDE COMMODE, GLUCERNA  Agency/Supplier:[facility]    [phone]  Other:PCS SERVICES WILL CONTACT DAUGHTER AT HOME TO SET UP ASSESSMENT  GENERAL COMMUNITY RESOURCES FOR PATIENT/FAMILY: Support Groups:[facility] SUPPORT GROUP EVERY SECOND Thursday @ 3:00-4:00 PM ON THE REHAB UNIT ( SEPT-MAY) QUESTIONS CONTACT [place] [phone]   STROKE/TIA DISCHARGE INSTRUCTIONS SMOKING Cigarette smoking nearly doubles your risk of  having a stroke & is the single most alterable risk factor  If you smoke or have smoked in the last 12 months, you are advised to quit smoking for your health.  Most of the excess cardiovascular risk related to smoking disappears within a year of stopping.  Ask you doctor about anti-smoking medications  [place] Quit Line: 1-800-QUIT NOW  Free Smoking Cessation Classes [phone]  CHOLESTEROL Know your levels; limit fat & cholesterol in your diet  Lipid Panel     Component Value Date/Time   CHOL 125 07/09/2017 0633   CHOL 329 (H) 11/06/2016 1040   TRIG 99 07/09/2017 0633   HDL 33 (L) 07/09/2017 0633   HDL 50 11/06/2016 1040   CHOLHDL 3.8 07/09/2017 0633   VLDL 20 07/09/2017 0633   LDLCALC 72 07/09/2017 0633   LDLCALC 229 (H) 11/06/2016 1040      Many patients benefit from treatment even if their cholesterol is at goal.  Goal: Total Cholesterol (CHOL) less than 160  Goal:  Triglycerides (TRIG) less than 150  Goal:  HDL greater than 40  Goal:  LDL (LDLCALC) less than 100   BLOOD PRESSURE American Stroke Association blood pressure target is less that 120/80 mm/Hg  Your discharge blood pressure is:  BP: (!) 155/68  Monitor your blood pressure  Limit your salt and alcohol intake  Many individuals will require more than one medication for high blood pressure  DIABETES (A1c is a blood sugar average for last 3 months) Goal HGBA1c is under 7% (HBGA1c is blood sugar average for last 3 months)  Diabetes:     Lab Results  Component Value  Date   HGBA1C 7.3 (H) 07/09/2017     Your HGBA1c can be lowered with medications, healthy diet, and exercise.  Check your blood sugar as directed by your physician  Call your physician if you experience unexplained or low blood sugars.  PHYSICAL ACTIVITY/REHABILITATION Goal is 30 minutes at least 4 days per week  Activity: No driving, Therapies: see above Return to work: N/A  Activity decreases your risk of heart attack and stroke and  makes your heart stronger.  It helps control your weight and blood pressure; helps you relax and can improve your mood.  Participate in a regular exercise program.  Talk with your doctor about the best form of exercise for you (dancing, walking, swimming, cycling).  DIET/WEIGHT Goal is to maintain a healthy weight  Your discharge diet is:   Your height is:  Height:  (165.1 cm) Your current weight is: Weight: 95.3 kg (210 lb 1.6 oz) Your Body Mass Index (BMI) is:  BMI (Calculated): 34.96  Following the type of diet specifically designed for you will help prevent another stroke.  Your goal weight is:    Your goal Body Mass Index (BMI) is 19-24.  Healthy food habits can help reduce 3 risk factors for stroke:  High cholesterol, hypertension, and excess weight.  RESOURCES Stroke/Support Group:  Call [phone]   STROKE EDUCATION PROVIDED/REVIEWED AND GIVEN TO PATIENT Stroke warning signs and symptoms How to activate emergency medical system (call 911). Medications prescribed at discharge. Need for follow-up after discharge. Personal risk factors for stroke. Pneumonia vaccine given:  Flu vaccine given:  My questions have been answered, the writing is legible, and I understand these instructions.  I will adhere to these goals & educational materials that have been provided to me after my discharge from the hospital.     My questions have been answered and I understand these instructions. I will adhere to these goals and the provided educational materials after my discharge from the hospital.  Patient/Caregiver Signature _______________________________ Date __________  Clinician Signature _______________________________________ Date __________  Please bring this form and your medication list with you to all your follow-up doctor's appointments.

## 2017-08-06 NOTE — Progress Notes (Signed)
Modified Barium Swallow Progress Note  Patient Details  Name: Kelly Romero MRN: [id number] Date of Birth: 04-02-1965  Today's Date: 08/06/2017  Modified Barium Swallow completed.  Full report located under Chart Review in the Imaging Section.  Brief recommendations include the following:  Clinical Impression  Pt presents with no remarkable change in oropharyngeal abilities and continues to be very high risk of aspiration and dehyrdation. Continue to recommend puree diet with honey lick liquids d/t signficant deficits in oral phase (decreased lingual manipulation of bolus and oral holding of bolus). Pt also continues to present with significantly delayed swallow intiation to pyriform sinuses and subsquent silent aspiraiton of tsp trials of nectar thick liquids and thin liquids. Extensive compensatory strategies were attempted with both nectar and thin liquids. Strategies included head turn to right, chin tuck, superglottic swallow and Total A multimodal cues for quick swallow initiation. Pt's prognosis for further diet advancement is very poor at this time. Education provided to pt. She currently has PEG placement to provide nutrition and hydration.    Swallow Evaluation Recommendations       SLP Diet Recommendations: Alternative means - long-term;Dysphagia 1 (Puree) solids;Honey thick liquids   Liquid Administration via: Cup   Medication Administration: Whole meds with puree   Supervision: Patient able to self feed;Full supervision/cueing for compensatory strategies   Compensations: Minimize environmental distractions;Lingual sweep for clearance of pocketing;Slow rate;Small sips/bites   Postural Changes: Seated upright at 90 degrees   Oral Care Recommendations: Oral care BID   Other Recommendations: Order thickener from pharmacy;Prohibited food (jello, ice cream, thin soups);Have oral suction available;Remove water pitcher    Kaziah Krizek 08/06/2017,9:40 AM

## 2017-08-10 ENCOUNTER — Telehealth: Payer: Self-pay | Admitting: *Deleted

## 2017-08-10 MED ORDER — POLYETHYLENE GLYCOL 3350 17 G PO PACK: PACK | ORAL | 0 refills | Status: DC

## 2017-08-10 NOTE — Telephone Encounter (Addendum)
Transitional Care call-1:25 pm 08/09/17 1st attempt  to make contact for TC call, no answer @ Kelly Romero's or her daughter's numbers.  Left message to call office.  08/11/17 9:48 AM I spoke with [name] -son in law on EC  1. Are you/is patient experiencing any problems since coming home? Are there any questions regarding any aspect of care? NO 2. Are there any questions regarding medications administration/dosing? Are meds being taken as prescribed? Patient should review meds with caller to confirm Medications confirmed 3. Have there been any falls? NO NOT AMBULATORY YET 4. Has Home Health been to the house and/or have they contacted you? If not, have you tried to contact them? Can we help you contact them? YES 5. Are bowels and bladder emptying properly? Are there any unexpected incontinence issues? If applicable, is patient following bowel/bladder programs? NO 6. Any fevers, problems with breathing, unexpected pain? NO 7. Are there any skin problems or new areas of breakdown? NO 8. Has the patient/family member arranged specialty MD follow up (ie cardiology/neurology/renal/surgical/etc)?  Can we help arrange? appt given to see Dr Wynn Banker 9. Does the patient need any other services or support that we can help arrange? NO 10. Are caregivers following through as expected in assisting the patient? YES 11. Has the patient quit smoking, drinking alcohol, or using drugs as recommended? N/A    Appointment Thursday 08/19/17 :15 am, arrive by 10:00 to see Dr Wynn Banker Address reviewed [address] [phone]

## 2017-08-10 NOTE — Telephone Encounter (Signed)
May use Miralax 17g with 8 Oz H2O via tube daily

## 2017-08-10 NOTE — Telephone Encounter (Signed)
I notified Amy ST [facility] and spoke with Tawanna Cooler (EC) in home, as well, with instructions. Order sent to pharmacy.

## 2017-08-10 NOTE — Telephone Encounter (Signed)
Amy ST [facility] requesting orders for 1wk1, 51mo1. Approval given and ok to take orders from PCP if needed.  Family is stating she is having issues with constipation and they have given her some Miralax per tube.  Is this ok? Please advise with order if ok. Nothing was included for this on d/c meds.

## 2017-08-12 ENCOUNTER — Ambulatory Visit (INDEPENDENT_AMBULATORY_CARE_PROVIDER_SITE_OTHER): Payer: Medicaid Other | Admitting: *Deleted

## 2017-08-12 DIAGNOSIS — I639 Cerebral infarction, unspecified: Secondary | ICD-10-CM

## 2017-08-13 ENCOUNTER — Telehealth: Payer: Self-pay

## 2017-08-13 NOTE — Telephone Encounter (Signed)
[facility] 1wk1, 2wk4. Verbal orders given according to discharge summary via VM

## 2017-08-13 NOTE — Progress Notes (Signed)
Carelink Summary Report / Loop Recorder 

## 2017-08-19 ENCOUNTER — Encounter: Payer: Medicaid Other | Admitting: Physical Medicine & Rehabilitation

## 2017-08-30 ENCOUNTER — Telehealth: Payer: Self-pay | Admitting: *Deleted

## 2017-08-30 NOTE — Telephone Encounter (Signed)
Shireen OT [facility] called to notify MD of OT POC changes.  She is medicaid and they had to wait on approval.  The new POC is 3wk1 2wk1.  Approval given.

## 2017-09-02 ENCOUNTER — Inpatient Hospital Stay: Payer: Medicaid Other | Admitting: Physical Medicine & Rehabilitation

## 2017-09-02 ENCOUNTER — Encounter: Payer: Medicaid Other | Attending: Physical Medicine & Rehabilitation

## 2017-09-06 LAB — CUP PACEART REMOTE DEVICE CHECK
MDC IDC PG IMPLANT DT: 20190430
MDC IDC SESS DTM: 20190530213539

## 2017-09-07 ENCOUNTER — Telehealth: Payer: Self-pay

## 2017-09-07 NOTE — Telephone Encounter (Signed)
Sandra CockayneEmily Parker,PT with [facility] called requesting verbal order to continue PT with pt. Verbal orders given

## 2017-09-07 NOTE — Telephone Encounter (Signed)
Kelly Romero,ST calling to get verbal orders for continuation of 6 visit of ST and also upgrade diet texture to finely chopped.

## 2017-09-08 ENCOUNTER — Ambulatory Visit: Payer: Medicaid Other | Admitting: Neurology

## 2017-09-09 ENCOUNTER — Telehealth: Payer: Self-pay

## 2017-09-09 NOTE — Telephone Encounter (Signed)
Elmer BalesEmily Parker, PT/[facility] called requesting verbal order of [facility] 2wk3, 1wk1 to help with bathing. Verbal order given. Also she said pt had a Loop Reader and wanted to know who gets the readings. Left [facility].

## 2017-09-09 NOTE — Telephone Encounter (Signed)
Spoke with Speech therapist and gave verbal orders to continue visits. We still need approval to upgrade diet texture to finely chopped

## 2017-09-13 NOTE — Telephone Encounter (Signed)
May upgrade diet to finely chopped

## 2017-09-14 ENCOUNTER — Encounter: Payer: Self-pay | Admitting: Physical Medicine & Rehabilitation

## 2017-09-14 ENCOUNTER — Ambulatory Visit (INDEPENDENT_AMBULATORY_CARE_PROVIDER_SITE_OTHER): Payer: Medicaid Other | Admitting: *Deleted

## 2017-09-14 ENCOUNTER — Encounter: Payer: Medicaid Other | Attending: Physical Medicine & Rehabilitation

## 2017-09-14 ENCOUNTER — Ambulatory Visit (HOSPITAL_BASED_OUTPATIENT_CLINIC_OR_DEPARTMENT_OTHER): Payer: Medicaid Other | Admitting: Physical Medicine & Rehabilitation

## 2017-09-14 VITALS — BP 110/74 | HR 71 | Ht 65.0 in | Wt 211.0 lb

## 2017-09-14 DIAGNOSIS — I69391 Dysphagia following cerebral infarction: Secondary | ICD-10-CM

## 2017-09-14 DIAGNOSIS — I69351 Hemiplegia and hemiparesis following cerebral infarction affecting right dominant side: Secondary | ICD-10-CM | POA: Diagnosis not present

## 2017-09-14 DIAGNOSIS — I10 Essential (primary) hypertension: Secondary | ICD-10-CM | POA: Diagnosis not present

## 2017-09-14 DIAGNOSIS — E785 Hyperlipidemia, unspecified: Secondary | ICD-10-CM | POA: Insufficient documentation

## 2017-09-14 DIAGNOSIS — I69320 Aphasia following cerebral infarction: Secondary | ICD-10-CM | POA: Diagnosis not present

## 2017-09-14 DIAGNOSIS — F0150 Vascular dementia without behavioral disturbance: Secondary | ICD-10-CM | POA: Diagnosis not present

## 2017-09-14 DIAGNOSIS — I639 Cerebral infarction, unspecified: Secondary | ICD-10-CM | POA: Diagnosis not present

## 2017-09-14 DIAGNOSIS — I951 Orthostatic hypotension: Secondary | ICD-10-CM | POA: Insufficient documentation

## 2017-09-14 DIAGNOSIS — E119 Type 2 diabetes mellitus without complications: Secondary | ICD-10-CM | POA: Insufficient documentation

## 2017-09-14 NOTE — Progress Notes (Signed)
Subjective:    Patient ID: Kelly Romero, female    DOB: 1966-02-06, 52 y.o.   MRN: [id number]   52 year old female with history of HTN, T2DM, depression, HA, multiple CVAs most recent at [facility] on 04/2017 and [facility] with discharge on 07/10/17 after L- CVA. She was readmitted on 07/11/17 with worsening of slurred speech and right sided weakness. MRI brain showed few new strokes in parasagittal cortical and subcortical left frontal lobe. Work up was negative for vasculitis, hypercoagulopathy and Dr. Pearlean Brownie felt tha stroke due to advanced intracranial atherosclerosis. Loop recorder placed by Dr. Johney Frame and DAPT recommended X 3 months  Admit date: 07/13/2017 Discharge date: 08/06/2017 admitted to rehab 07/13/2017 for inpatient therapies to consist of PT, ST and OT at least three hours five days a week. Past admission physiatrist, therapy team and rehab RN have worked together to provide customized collaborative inpatient rehab. Hospital course has been significant for po intake as well as orthostatic hypotension with supine hypertension. Cardiology was consulted for input and recommended IVF for hydration X 24 hours with resumption of BB. She continued to have issues with minimal po intake and IVF were added at nights for hydration. She also refused multiple meds due to dislike of crushed medications as well as dislike of thickened liquids. She also continued to have severe orthostatic changes and  BP medications were further adjusted by cardiology. Dr. Eden Emms recommended not using florinef or midodrine with recommendations to  Improve nutritional status as well as aggressive therapy. HPI Finely chopped po takes >50% meals, pt without coughing or choking Does not drink thickened liquids Speech is recommending another swallow study Getting HHPT, OT, SLP 2 x per wk Pt not taking pills po Gets one glucerna can each evening  No passing out spells standing tolerance 40sec PCP added Jardiance no change to BP  meds Pain Inventory  Average Pain 0 Pain Right Now 0 My pain is na  In the last 24 hours, has pain interfered with the following? General activity 0 Relation with others 0 Enjoyment of life 0 What TIME of day is your pain at its worst? na Sleep (in general) Good  Pain is worse with: na Pain improves with: na Relief from Meds: na  Mobility ability to climb steps?  no do you drive?  no use a wheelchair needs help with transfers  Function disabled: date disabled . I need assistance with the following:  dressing, bathing and toileting  Neuro/Psych weakness trouble walking dizziness confusion depression  Prior Studies Any changes since last visit?  no  Physicians involved in your care Any changes since last visit?  no   Family History  Problem Relation Age of Onset  . Stroke Father        32s  . Heart attack Father 61  . COPD Mother   . Heart failure Brother   . Cancer Maternal Grandmother        unknown   . COPD Unknown   . Heart failure Maternal Grandfather   . Hypertension Maternal Grandfather   . Cancer Paternal Grandfather        lung  . Diabetes Paternal Grandmother    Social History   Socioeconomic History  . Marital status: Married    Spouse name: Not on file  . Number of children: Not on file  . Years of education: Not on file  . Highest education level: Not on file  Occupational History  . Occupation: Data processing manager  Social Needs  .  Financial resource strain: Not on file  . Food insecurity:    Worry: Not on file    Inability: Not on file  . Transportation needs:    Medical: Not on file    Non-medical: Not on file  Tobacco Use  . Smoking status: Never Smoker  . Smokeless tobacco: Never Used  Substance and Sexual Activity  . Alcohol use: No    Alcohol/week: 0.0 oz    Comment: rare   . Drug use: No  . Sexual activity: Not Currently  Lifestyle  . Physical activity:    Days per week: Not on file    Minutes per session: Not on  file  . Stress: Not on file  Relationships  . Social connections:    Talks on phone: Not on file    Gets together: Not on file    Attends religious service: Not on file    Active member of club or organization: Not on file    Attends meetings of clubs or organizations: Not on file    Relationship status: Not on file  Other Topics Concern  . Not on file  Social History Narrative  . Not on file   Past Surgical History:  Procedure Laterality Date  . APPENDECTOMY    . CHOLECYSTECTOMY    . FOOT GANGLION EXCISION    . HERNIA REPAIR    . IR GASTROSTOMY TUBE MOD SED  07/30/2017  . IR GENERIC HISTORICAL  12/30/2015   IR ANGIO INTRA EXTRACRAN SEL COM CAROTID INNOMINATE BILAT MOD SED 12/30/2015 Julieanne Cotton, MD [facility]-INTER[facility] RAD  . IR GENERIC HISTORICAL  12/30/2015   IR ANGIO VERTEBRAL SEL VERTEBRAL BILAT MOD SED 12/30/2015 Julieanne Cotton, MD [facility]  . LOOP RECORDER INSERTION N/A 07/13/2017   Procedure: LOOP RECORDER INSERTION;  Surgeon: Hillis Range, MD;  Location: [facility] INVASIVE CV LAB;  Service: Cardiovascular;  Laterality: N/A;  . TEE WITHOUT CARDIOVERSION N/A 04/19/2015   Procedure: TRANSESOPHAGEAL ECHOCARDIOGRAM (TEE);  Surgeon: Jake Bathe, MD;  Location: [facility] ENDOSCOPY;  Service: Cardiovascular;  Laterality: N/A;  . VAGINAL HYSTERECTOMY     Past Medical History:  Diagnosis Date  . Diabetes mellitus without complication (HCC)   . Headache   . Hyperlipidemia   . Hypertension   . Stroke (HCC)   . TIA (transient ischemic attack)    There were no vitals taken for this visit.  Opioid Risk Score:   Fall Risk Score:  `1  Depression screen PHQ 2/9  Depression screen PHQ 2/9 04/10/2016  Decreased Interest 2  Down, Depressed, Hopeless 2  PHQ - 2 Score 4  Altered sleeping 3  Tired, decreased energy 3  Change in appetite 0  Feeling bad or failure about yourself  1  Trouble concentrating 3  Moving slowly or fidgety/restless 3  Suicidal thoughts 1  PHQ-9 Score 18   Difficult doing work/chores Extremely dIfficult     Review of Systems  Constitutional: Negative.   HENT: Negative.   Eyes: Negative.   Respiratory: Negative.   Cardiovascular: Negative.   Gastrointestinal: Positive for constipation.  Endocrine: Negative.   Genitourinary: Negative.   Musculoskeletal: Positive for gait problem.  Skin: Negative.   Allergic/Immunologic: Negative.   Neurological: Positive for dizziness and weakness.  Hematological: Negative.   Psychiatric/Behavioral: Positive for confusion and dysphoric mood.  All other systems reviewed and are negative.      Objective:   Physical Exam  Constitutional: She appears well-developed and well-nourished. No distress.  HENT:  Head: Normocephalic and atraumatic.  Eyes: Pupils are equal, round, and reactive to light. EOM are normal.  Neck: Normal range of motion.  Cardiovascular: Normal rate, regular rhythm and normal heart sounds.  No murmur heard. Pulmonary/Chest: Effort normal and breath sounds normal. No stridor. No respiratory distress.  Abdominal: Soft. Bowel sounds are normal. She exhibits no distension.  Skin: Skin is warm and dry. She is not diaphoretic.  Psychiatric: Her affect is blunt. Her speech is delayed and slurred. She is slowed. She exhibits abnormal recent memory and abnormal remote memory.  Nursing note and vitals reviewed. Patient is oriented to person, doctor's office but not time. She seems surprised when informed that she has had multiple strokes even though this has been discussed during her inpatient rehab stay on several occasions Motor strength is 3+ in the right deltoid, bicep, tricep, grip, hip flexor, knee extensor ankle dorsiflexor 4/5 in the left deltoid, bicep, tricep, grip, hip flexion.       Assessment & Plan:  1.  Left parasagittal cortical as well as left frontal subcortical infarcts due to diffuse intracranial atherosclerosis. Prior strokes have affected left PCA distribution  as well as bilateral basal ganglia.  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 As discussed with patient and son-in-law, she still has some potential for improvement I do think she should be able to upgrade her swallow and take thin liquids eventually as well as p.o. meds.  She is already taking adequate calories.  We will send for another swallow study. Reassess in 1 month looking at her ability to take in p.o. fluid.  Will determine whether or not to refer to interventional radiology to remove PEG tube at that time  Continue home health PT OT speech  2.  Orthostatic hypotension with standing tolerance is 40 seconds.  Would recommend continue TED hose as well as abdominal binder.  Encourage fluid intake but at this point this is limited to PEG flushes Recommend outpatient cardiology follow-up Patient will see neurology tomorrow

## 2017-09-14 NOTE — Patient Instructions (Signed)

## 2017-09-14 NOTE — Telephone Encounter (Signed)
Amy ST notified. 

## 2017-09-15 ENCOUNTER — Encounter: Payer: Self-pay | Admitting: Neurology

## 2017-09-15 ENCOUNTER — Ambulatory Visit: Payer: Medicaid Other | Admitting: Neurology

## 2017-09-15 ENCOUNTER — Other Ambulatory Visit: Payer: Self-pay

## 2017-09-15 VITALS — BP 98/60 | HR 70 | Ht 65.0 in | Wt 211.0 lb

## 2017-09-15 DIAGNOSIS — I639 Cerebral infarction, unspecified: Secondary | ICD-10-CM

## 2017-09-15 DIAGNOSIS — E119 Type 2 diabetes mellitus without complications: Secondary | ICD-10-CM

## 2017-09-15 DIAGNOSIS — I69351 Hemiplegia and hemiparesis following cerebral infarction affecting right dominant side: Secondary | ICD-10-CM | POA: Diagnosis not present

## 2017-09-15 DIAGNOSIS — G43009 Migraine without aura, not intractable, without status migrainosus: Secondary | ICD-10-CM | POA: Diagnosis not present

## 2017-09-15 DIAGNOSIS — E785 Hyperlipidemia, unspecified: Secondary | ICD-10-CM

## 2017-09-15 DIAGNOSIS — E118 Type 2 diabetes mellitus with unspecified complications: Secondary | ICD-10-CM | POA: Diagnosis not present

## 2017-09-15 DIAGNOSIS — H53469 Homonymous bilateral field defects, unspecified side: Secondary | ICD-10-CM | POA: Diagnosis not present

## 2017-09-15 DIAGNOSIS — Z794 Long term (current) use of insulin: Secondary | ICD-10-CM

## 2017-09-15 DIAGNOSIS — I69398 Other sequelae of cerebral infarction: Secondary | ICD-10-CM | POA: Diagnosis not present

## 2017-09-15 DIAGNOSIS — G3184 Mild cognitive impairment, so stated: Secondary | ICD-10-CM

## 2017-09-15 DIAGNOSIS — I951 Orthostatic hypotension: Secondary | ICD-10-CM | POA: Diagnosis not present

## 2017-09-15 DIAGNOSIS — I10 Essential (primary) hypertension: Secondary | ICD-10-CM

## 2017-09-15 NOTE — Patient Instructions (Signed)
1.  Take aspirin 325mg  daily with Plavix for the rest of July.  Once August starts, discontinue aspirin and just continue Plavix 2.  Continue Crestor 40mg  3.  Will refer you to cardiology for treatment of orthostatic hypotension 4.  Follow up in 3 months.

## 2017-09-15 NOTE — Progress Notes (Signed)
NEUROLOGY FOLLOW UP OFFICE NOTE  Kelly Romero [id number]  HISTORY OF PRESENT ILLNESS: Kelly Romero is a 52 year old right-handed female with diabetes, hypertension and depression, headache and prior strokes who presents for recent stroke.  She is accompanied by her husband who supplements history.     I  STROKE/ENCEPHALOPATHY: Update: Last visit, I had referred her to Dr. Delia Heady, stroke specialist.  She was admitted to [facility] from 07/08/17 to 07/10/17 for slurred speech and right facial droop.  MRI of brain showed small acute infarcts in the left hemisphere.  Carotid doppler showed no hemodynamically significant stenosis.  Echocardiogram showed normal EF with no evidence of PFO.  LDL was 72.  A1c was 7.3.  She was advised to continue ASA and Plavix and Crestor 40mg .  She was admitted to [facility] from 07/11/17 to 07/13/17 for another stroke.  She presented with worsening dysarthria and new right facial and right upper extremity numbness.  Dr. Pearlean Brownie happened to be the hospital physician.  CT head showed stable extensive chronic small vessel disease but no acute findings.  MRI of brain showed increased size of acute infarct within left corona radiate and caudate body, interval infarction of the penumbra as well as stable scattered infarcts from recent stroke.Marland Kitchen She was continued on dual antiplatelet therapy for 3 months, however she has only been on the Plavix alone.  Due to dysphagia, she required a PEG insertion.  While in rehab, she had syncopal episodes due to orthostatic hypotension.  Her blood pressure fluctuates from 90s/60s to 150s systolic.  She had an implantable loop recorder inserted.   History: She was in [facility] on 12/07/14 for stroke symptoms.  She developed slurred speech and right arm numbness.   She was agitated, yelling and screaming in the ED.  She was given Ativan and Haldol.  CT of head was unremarkable.  Repeat CT of head two days later  was negative.  CXR negative.  Urine tox screen was positive for opioids.  She was found to have severally elevated blood pressure with systolic at 240 and higher.  Hgb A1c was 12 with serum glucose level of 400.  She did not have fever or leukocytosis, and UA was borderline with trace leukocytes but negative nitrite.  She was treated with 5 days of IV Rocephin.  Troponins were negative.  Etiology was unknown but TIA was suspected as a possibility.   She had a similar stroke-like episode on 02/22/15.  She woke up with headache and later developed slurred speech.  She was admitted to [facility], where MRI of brain revealed 2 mm left occipital subacute infarct.  2D echo was performed, which appeared unremarkable, but didn't make mention of a PFO.  Carotid doppler again showed no hemodynamically significant ICA stenosis.  Hypercoagulable panel, ANA, lupus panel, antiextractable nuclear antigen were negative.  LDL was 130.  Plavix was added to her ASA.  Crestor was increased to 40mg  daily.  Labs from August 2016 include Sed Rate 30, ANA negative, ANCA screen negative, antiextractable nuclear antigen (RNP abs, Smith abs) negative.  She had a TEE on 04/19/15, which revealed EF 60-65% with no PFO or atrial septal defect or other cardiac source of emboli.  After 3 months on dual antiplatelet therapy, she was continued on Plavix alone.  She was on amitriptyline 25mg  at bedtime to help with depression and headache.   She was admitted to [facility] between 08/15/15 and 08/19/15 for right-sided (however  some notes state left-sided) weakness and numbness associated with forgetfulness and agitation.  Blood pressure was elevated.  NIHSS was 4.  Due to minimal symptoms, she did not receive tPA.  Urine tox screen was negative.  CT of head showed no acute findings.  MRI of brain with and without contrast revealed possible small acute infarct in the posterior right MCA territory.  However, it appears on previous imaging, which  suggests it is likely an old finding.  Neurology believed it to be an old infarct, as it apparently was seen on a prior MRI in February 2017.  TTE again was unremarkable with EF of 65-70%.  LDL was 231.  Hgb A1c was 10.5.  Exact cause of encephalopathy was unclear.  She underwent an EEG on 08/18/15, which revealed mild to moderate generalized background slowing but no epileptiform activity.  Neurology felt symptoms may be psychogenic.  She was started on Depakote 500mg  twice daily for possible seizures.    She was maintained on ASA 81mg  and Plavix, as well as Rosuvastatin 20mg .  She reports word-finding difficulties, which has been ongoing for several months.   She was readmitted to [facility] from 12/26/15 to 12/22/2015 for altered mental status, fever of 103 and elevated blood pressure with systolic in 220s.  MRI of brain revealed small acute and subacute infarcts in the bilateral cerebral hemisphere.  Due to fever, she was started on IV antibiotics and acyclovir.  UA and urine tox screen was negative.  Blood cultures showed 1 of 2 positive for gram +, likely contaminated.  Endocarditis was not suspected.  She underwent LP.  CSF cell count was 1, gram stain negative, protein 49, glucose 98, oligoclonal bands negative, fungal cultures, CMV, VZV, VDRL and HSV were negative.  In the serum, HIV and RPR were nonreactive.  Arbovirus panel was negative.  ACE was 26.  Hypercoagulable panel (including homocysteine, lupus anticoagulant, beta-2-glycoprotein, cardiolipin antibodies) was negative.  TTE showed normal LVEF 55-60% and TCD showed clinically insignificant right to left shunt with valsalva only.  Cerebral angiogram revealed mild athersclerosis with 65-70% stenosis of left MCA superior division, severe stenosis of proximal PCA P1 segment and tapered stenosis of distal right pericallosal artery.  Hgb A1c was 13.  LDL was 153.  Lower extremity doppler revealed distal DVT in the right lower extremity.  Plavix was  switched to ASA 325mg  daily.  DVT is being monitored and managed by her PCP.  Recommendation was to recheck venous ultrasound every 2 weeks and if there is any proximal extension, then the switch to anticoagulation.  30 day Holter monitor from late 2017 revealed no arrhythmia.   She was admitted to [facility] from on 10/02/16 for headache, dizziness and hypertension.  CT of head from 09/30/16 showed a subacute to chronic infarct in the left parietooccipital junction.  CTA of head from 10/01/16  demonstrated high-grade stenosis or occlusion at the left P2 segment bifurcation with nonvisualization of the superior left P3 segment. MRI of brain without contrast from 10/02/16 demonstrated a small acute infarct in the right body of the corpus callosum.  Carotid doppler showed moderate heterogeneous and calcified plaque without hemodynamically significant stenosis.  2D echo showed EF 60-65% with no PFO or other cardiac source of emboli.  Telemetry revealed no atrial fibrillation.  LDL was reportedly 239.  Rosuvastatin 40mg  was changed to atorvastatin 80mg .  She was maintained on ASA 325mg  and Plavix 75mg .  She was also treated for UTI.   She had a nuclear  stress test on 11/26/16 which was normal with EF 55-65%.  For uncontrolled hypertension, she had a renal ultrasound on 11/25/16, which was normal.  She was admitted to [facility] [place] from 04/27/17 to 04/30/17 for stroke.   She presented with one week history of intermittent slurred speech.  CT of head showed no acute abnormalities.  CTA of head showed multiple stable segments of mild to moderate stenosis involving the ACA, MCA and PCA distributions as well as stable severe stenosis of left M2.  Compared to prior CTA from July 2018, she demonstrated interval occlusion of right PCA distal calcarine branch.  CTA of neck showed bilateral 60% proximal ICA stenosis.  MRI of brain with and without contrast showed multiple subacute infarcts involving the the  right lateral lenticulostriate territory, right parietal region and cortical cerebral hemispheres.  TTE showed EF 65-70% with negative bubble study.  LDL was 347.  Hgb A1c was 13.6.  She reportedly had been noncompliant with her medications. She had stopped taking her medication for a month due to depression.  She is back on all of her medications.  It was believed that her strokes were secondary to intracranial large vessel disease rather than cardiac source.     II Cognitive impairment/aphasia: She underwent neuropsychological testing on 03/10/16.  She exhibited adjustment disorder with depression and anxiety.  However, a cognitive diagnosis could not be made due to poor effort on testing.  It was recommended to continue treating anxiety and depression.   III  HEADACHES: Update: She has not had any recent headaches. Current NSAIDS:  no Current analgesics:  Ibuprofen, Tylenol Current triptans:  no Current anti-emetic:  no Current muscle relaxants:  no Current anti-anxiolytic:  no Current sleep aide:  no Current Antihypertensive medications:  lisinopril Current Antidepressant medications:  escitalopram 10mg  Current Anticonvulsant medications:  divalproex DR 500mg  twice daily Current Vitamins/Herbal/Supplements:  Magnesium citrate 400mg  Current Antihistamines/Decongestants:  no Other therapy:  No   04/26/17:  CBC with WBC 4.7, HGB 11.9, HCT 34.8, PLT 204; CMP with Na 133, K 3.8, Cl 95, CO2 33, glucose 471, BUN 17, Cr 1.00, t bili 0.3, ALP 70, AST 15 and ALT 18.   History: Onset:  July 2016.  Sudden onset. Location:  Right sided, from front to back of head.  Initially had pain behind right ear. Quality:  squeezing Initial intensity:  7-8/10 Aura:  no Prodrome:  no Associated symptoms:  Some photophobia and phonophobia.  Sometimes nausea.  She denies visual disturbance. Initial Duration:  All day Initial Frequency:  Daily.  Sometimes wakes her up at night Triggers/exacerbating factors:   Loud noise Relieving factors:  Laying in a cool dark quiet room   Past abortive medication:  Tylenol, Excedrin, Percocet, ibuprofen 800mg  Past preventative medication:  amitriptyline 25mg  Other past therapy:  none   Sed Rate was 30.  MRI of the brain from 11/09/14 showed chronic small vessel ischemic changes with an incidental tiny subacute infarct in the right frontal lobe.   PAST MEDICAL HISTORY: Past Medical History:  Diagnosis Date  . Diabetes mellitus without complication (HCC)   . Headache   . Hyperlipidemia   . Hypertension   . Stroke (HCC)   . TIA (transient ischemic attack)     MEDICATIONS: Current Outpatient Medications on File Prior to Visit  Medication Sig Dispense Refill  . Amino Acids-Protein Hydrolys (FEEDING SUPPLEMENT, PRO-STAT SUGAR FREE 64,) LIQD Take 30 mLs by mouth 2 (two) times daily. 900 mL 0  . clopidogrel (  PLAVIX) 75 MG tablet Take 1 tablet (75 mg total) by mouth daily. 30 tablet 0  . divalproex (DEPAKOTE) 250 MG DR tablet Take 1 tablet (250 mg total) by mouth 2 (two) times daily. 60 tablet 0  . empagliflozin (JARDIANCE) 10 MG TABS tablet Take 10 mg by mouth daily.    Marland Kitchen. escitalopram (LEXAPRO) 5 MG tablet Take 1 tablet (5 mg total) by mouth at bedtime. 30 tablet 0  . insulin detemir (LEVEMIR) 100 UNIT/ML injection Inject 0.2 mLs (20 Units total) into the skin daily before breakfast. 10 mL 11  . lisinopril (PRINIVIL,ZESTRIL) 10 MG tablet Take 1 tablet (10 mg total) by mouth 2 (two) times daily. 60 tablet 0  . metoprolol tartrate (LOPRESSOR) 25 MG tablet Take 0.5 tablets (12.5 mg total) by mouth 2 (two) times daily. 30 tablet 0  . neomycin-bacitracin-polymyxin (NEOSPORIN) OINT Apply 1 application topically 2 (two) times daily. 30 g 0  . Nutritional Supplements (FEEDING SUPPLEMENT, GLUCERNA 1.2 CAL,) LIQD Place 240 mLs into feeding tube 4 (four) times daily -  with meals and at bedtime.    . polyethylene glycol (MIRALAX / GLYCOLAX) packet May take 17 g with 8 oz  water per peg tube daily prn 30 each 0  . rosuvastatin (CRESTOR) 40 MG tablet Take 1 tablet (40 mg total) by mouth at bedtime. 30 tablet 0  . simethicone (MYLICON) 80 MG chewable tablet Chew 1 tablet (80 mg total) by mouth 4 (four) times daily. 300 tablet 0  . Water For Irrigation, Sterile (FREE WATER) SOLN Place 250 mLs into feeding tube every 4 (four) hours. Use filtered water (not distilled water)     No current facility-administered medications on file prior to visit.     ALLERGIES: Allergies  Allergen Reactions  . Erythromycin Itching  . Latex Itching    FAMILY HISTORY: Family History  Problem Relation Age of Onset  . Stroke Father        4450s  . Heart attack Father [id number]  . COPD Mother   . Heart failure Brother   . Cancer Maternal Grandmother        unknown   . COPD Unknown   . Heart failure Maternal Grandfather   . Hypertension Maternal Grandfather   . Cancer Paternal Grandfather        lung  . Diabetes Paternal Grandmother     SOCIAL HISTORY: Social History   Socioeconomic History  . Marital status: Married    Spouse name: Not on file  . Number of children: Not on file  . Years of education: Not on file  . Highest education level: Not on file  Occupational History  . Occupation: Data processing managerAssistant Teacher  Social Needs  . Financial resource strain: Not on file  . Food insecurity:    Worry: Not on file    Inability: Not on file  . Transportation needs:    Medical: Not on file    Non-medical: Not on file  Tobacco Use  . Smoking status: Never Smoker  . Smokeless tobacco: Never Used  Substance and Sexual Activity  . Alcohol use: No    Alcohol/week: 0.0 oz    Comment: rare   . Drug use: No  . Sexual activity: Not Currently  Lifestyle  . Physical activity:    Days per week: Not on file    Minutes per session: Not on file  . Stress: Not on file  Relationships  . Social connections:    Talks on phone: Not on file  Gets together: Not on file    Attends  religious service: Not on file    Active member of club or organization: Not on file    Attends meetings of clubs or organizations: Not on file    Relationship status: Not on file  . Intimate partner violence:    Fear of current or ex partner: Not on file    Emotionally abused: Not on file    Physically abused: Not on file    Forced sexual activity: Not on file  Other Topics Concern  . Not on file  Social History Narrative  . Not on file    REVIEW OF SYSTEMS: Constitutional: No fevers, chills, or sweats, no generalized fatigue, change in appetite Eyes: No visual changes, double vision, eye pain Ear, nose and throat: No hearing loss, ear pain, nasal congestion, sore throat Cardiovascular: No chest pain, palpitations Respiratory:  No shortness of breath at rest or with exertion, wheezes GastrointestinaI: No nausea, vomiting, diarrhea, abdominal pain, fecal incontinence Genitourinary:  No dysuria, urinary retention or frequency Musculoskeletal:  No neck pain, back pain Integumentary: No rash, pruritus, skin lesions Neurological: as above Psychiatric: No depression, insomnia, anxiety Endocrine: No palpitations, fatigue, diaphoresis, mood swings, change in appetite, change in weight, increased thirst Hematologic/Lymphatic:  No purpura, petechiae. Allergic/Immunologic: no itchy/runny eyes, nasal congestion, recent allergic reactions, rashes  PHYSICAL EXAM: Vitals:   09/15/17 0950  BP: 98/60  Pulse: 70  SpO2: 98%   General: No acute distress.  Patient appears well-groomed.   Head:  Normocephalic/atraumatic Eyes:  Fundi examined but not visualized Neck: supple, no paraspinal tenderness, full range of motion Heart:  Regular rate and rhythm Lungs:  Clear to auscultation bilaterally Back: No paraspinal tenderness Neurological Exam: alert and oriented to person, place, and time. Attention span and concentration mildly impaired, recent memory mildly impaired, remote memory intact,  fund of knowledge intact.  Speech fluent and but dysarthric, language intact.  Right homonymous hemianopsia, flattening of right nasolabial fold.  Otherwise, CN II-XII intact. Bulk and tone normal, muscle strength 5-/5 right upper and lower extremities, 5/5 left upper and lower extremities  Decreased finger tapping on right.  Sensation to light touch, temperature and vibration intact.  Deep tendon reflexes 2+ throughout, right Babinski.  Finger to nose testing intact but slowed on right.  Non-ambulatory.   IMPRESSION: 1.  Recurrent strokes, cryptogenic.  Residual cognitive impairment and right homonymous hemianopsia.  She has uncontrolled diabetes, hyperlipidemia, hypertension and significant intracranial atherosclerotic disease which all could have contributed to her strokes.  However, due to the multiple infarcts, I am wondering if further cardiac workup with loop recorder is needed.  Other stroke workup has been unremarkable (including TTE/TEE, 30 day Holter monitor, carotid doppler and hypercoagulable panel).  She had a DVT but no PFO.   2.  Vascular cognitive impairment. 3.  Migraine and tension type headache 4.  Hypertension/orthostatic hypotension 5.  Hyperlipidemia 6.  Depression and anxiety  PLAN: 1.  With implantable loop recorder to identify paroxsymal atrial fibrillation 2.  Will take ASA 325mg  with Plavix for a month and then continue Plavix alone. 3.  Continue Crestor (LDL goal less than 70) 4.  Optimize glycemic control, weight loss 5.  Refer to cardiology for management of hypertension with orthostatic hypotension. 6.  Continue divaloproex DR 250mg  twice daily 7.  Follow up in 3 months.  25 minutes spent face to face with patient, over 50% spent discussing management.  Shon Millet, DO  CC: Dr. Jeanie Sewer

## 2017-09-15 NOTE — Progress Notes (Signed)
Carelink Summary Report / Loop Recorder 

## 2017-09-28 ENCOUNTER — Ambulatory Visit (HOSPITAL_COMMUNITY)
Admission: RE | Admit: 2017-09-28 | Discharge: 2017-09-28 | Disposition: A | Discharge status: Home / Self Care | Payer: Medicaid Other | Source: Ambulatory Visit | Attending: Physical Medicine & Rehabilitation | Admitting: Physical Medicine & Rehabilitation

## 2017-09-28 DIAGNOSIS — E785 Hyperlipidemia, unspecified: Secondary | ICD-10-CM | POA: Insufficient documentation

## 2017-09-28 DIAGNOSIS — Z931 Gastrostomy status: Secondary | ICD-10-CM | POA: Diagnosis not present

## 2017-09-28 DIAGNOSIS — Z9889 Other specified postprocedural states: Secondary | ICD-10-CM | POA: Insufficient documentation

## 2017-09-28 DIAGNOSIS — I10 Essential (primary) hypertension: Secondary | ICD-10-CM | POA: Diagnosis not present

## 2017-09-28 DIAGNOSIS — Z9049 Acquired absence of other specified parts of digestive tract: Secondary | ICD-10-CM | POA: Diagnosis not present

## 2017-09-28 DIAGNOSIS — I69391 Dysphagia following cerebral infarction: Secondary | ICD-10-CM | POA: Diagnosis present

## 2017-09-28 DIAGNOSIS — E119 Type 2 diabetes mellitus without complications: Secondary | ICD-10-CM | POA: Diagnosis not present

## 2017-10-01 ENCOUNTER — Telehealth: Payer: Self-pay | Admitting: Cardiovascular Disease

## 2017-10-01 NOTE — Telephone Encounter (Signed)
[facility], Appt 11/05/17 @ 10:15AM. [place]

## 2017-10-12 ENCOUNTER — Ambulatory Visit: Payer: Medicaid Other | Admitting: Physical Medicine & Rehabilitation

## 2017-10-18 ENCOUNTER — Ambulatory Visit (INDEPENDENT_AMBULATORY_CARE_PROVIDER_SITE_OTHER): Payer: Medicaid Other | Admitting: *Deleted

## 2017-10-18 DIAGNOSIS — I639 Cerebral infarction, unspecified: Secondary | ICD-10-CM | POA: Diagnosis not present

## 2017-10-19 NOTE — Progress Notes (Signed)
Carelink Summary Report / Loop Recorder 

## 2017-10-21 LAB — CUP PACEART REMOTE DEVICE CHECK
Implantable Pulse Generator Implant Date: 20190430
MDC IDC SESS DTM: 20190702213802

## 2017-10-28 ENCOUNTER — Ambulatory Visit: Payer: Medicaid Other | Admitting: Physical Medicine & Rehabilitation

## 2017-10-28 ENCOUNTER — Encounter: Payer: Self-pay | Admitting: Physical Medicine & Rehabilitation

## 2017-10-28 ENCOUNTER — Encounter: Payer: Medicaid Other | Attending: Physical Medicine & Rehabilitation

## 2017-10-28 VITALS — BP 136/80 | HR 64 | Resp 14 | Ht 65.0 in | Wt 188.0 lb

## 2017-10-28 DIAGNOSIS — I69320 Aphasia following cerebral infarction: Secondary | ICD-10-CM

## 2017-10-28 DIAGNOSIS — I69359 Hemiplegia and hemiparesis following cerebral infarction affecting unspecified side: Secondary | ICD-10-CM | POA: Diagnosis not present

## 2017-10-28 DIAGNOSIS — I10 Essential (primary) hypertension: Secondary | ICD-10-CM | POA: Diagnosis not present

## 2017-10-28 DIAGNOSIS — I69351 Hemiplegia and hemiparesis following cerebral infarction affecting right dominant side: Secondary | ICD-10-CM | POA: Insufficient documentation

## 2017-10-28 DIAGNOSIS — E785 Hyperlipidemia, unspecified: Secondary | ICD-10-CM | POA: Diagnosis not present

## 2017-10-28 DIAGNOSIS — E119 Type 2 diabetes mellitus without complications: Secondary | ICD-10-CM | POA: Insufficient documentation

## 2017-10-28 DIAGNOSIS — I951 Orthostatic hypotension: Secondary | ICD-10-CM | POA: Insufficient documentation

## 2017-10-28 DIAGNOSIS — F0150 Vascular dementia without behavioral disturbance: Secondary | ICD-10-CM | POA: Diagnosis not present

## 2017-10-28 DIAGNOSIS — I69391 Dysphagia following cerebral infarction: Secondary | ICD-10-CM

## 2017-10-28 NOTE — Patient Instructions (Signed)
[facility] to [facility]t for tube removal

## 2017-10-28 NOTE — Progress Notes (Signed)
Subjective:    Patient ID: Kelly Romero, female    DOB: 05/09/1965, 52 y.o.   MRN: [id number]  HPI  Eating 3 small meals per day, problems with bread, liquids go down well.  Swallows pills ok Orthostatic hypotension limiting weights  CVA x 4 , Most recent infarct May 2019 [place] radiata and head of caudate  PEG placed 07/30/2017  Pain Inventory Average Pain 0 Pain Right Now 0 My pain is no pain  In the last 24 hours, has pain interfered with the following? General activity 1 Relation with others 10 Enjoyment of life 0 What TIME of day is your pain at its worst? no pain Sleep (in general) Fair  Pain is worse with: no pain Pain improves with: no pain Relief from Meds: no pain  Mobility how many minutes can you walk? 0 ability to climb steps?  no do you drive?  no use a wheelchair needs help with transfers  Function disabled: date disabled . I need assistance with the following:  dressing, bathing, toileting and meal prep  Neuro/Psych weakness trouble walking dizziness confusion depression anxiety  Prior Studies Any changes since last visit?  no  Physicians involved in your care Any changes since last visit?  no   Family History  Problem Relation Age of Onset  . Stroke Father        4850s  . Heart attack Father [id number]  . COPD Mother   . Heart failure Brother   . Cancer Maternal Grandmother        unknown   . COPD Unknown   . Heart failure Maternal Grandfather   . Hypertension Maternal Grandfather   . Cancer Paternal Grandfather        lung  . Diabetes Paternal Grandmother    Social History   Socioeconomic History  . Marital status: Married    Spouse name: Not on file  . Number of children: Not on file  . Years of education: Not on file  . Highest education level: Not on file  Occupational History  . Occupation: Data processing managerAssistant Teacher  Social Needs  . Financial resource strain: Not on file  . Food insecurity:    Worry: Not on file   Inability: Not on file  . Transportation needs:    Medical: Not on file    Non-medical: Not on file  Tobacco Use  . Smoking status: Never Smoker  . Smokeless tobacco: Never Used  Substance and Sexual Activity  . Alcohol use: No    Alcohol/week: 0.0 standard drinks    Comment: rare   . Drug use: No  . Sexual activity: Not Currently  Lifestyle  . Physical activity:    Days per week: Not on file    Minutes per session: Not on file  . Stress: Not on file  Relationships  . Social connections:    Talks on phone: Not on file    Gets together: Not on file    Attends religious service: Not on file    Active member of club or organization: Not on file    Attends meetings of clubs or organizations: Not on file    Relationship status: Not on file  Other Topics Concern  . Not on file  Social History Narrative  . Not on file   Past Surgical History:  Procedure Laterality Date  . APPENDECTOMY    . CHOLECYSTECTOMY    . FOOT GANGLION EXCISION    . HERNIA REPAIR    . IR  GASTROSTOMY TUBE MOD SED  07/30/2017  . IR GENERIC HISTORICAL  12/30/2015   IR ANGIO INTRA EXTRACRAN SEL COM CAROTID INNOMINATE BILAT MOD SED 12/30/2015 Julieanne CottonSanjeev Deveshwar, MD [facility]  . IR GENERIC HISTORICAL  12/30/2015   IR ANGIO VERTEBRAL SEL VERTEBRAL BILAT MOD SED 12/30/2015 Julieanne CottonSanjeev Deveshwar, MD [facility]  . LOOP RECORDER INSERTION N/A 07/13/2017   Procedure: LOOP RECORDER INSERTION;  Surgeon: Hillis RangeAllred, James, MD;  Location: [facility];  Service: Cardiovascular;  Laterality: N/A;  . TEE WITHOUT CARDIOVERSION N/A 04/19/2015   Procedure: TRANSESOPHAGEAL ECHOCARDIOGRAM (TEE);  Surgeon: Jake BatheMark C Skains, MD;  [facility];  Service: Cardiovascular;  Laterality: N/A;  . VAGINAL HYSTERECTOMY     Past Medical History:  Diagnosis Date  . Diabetes mellitus without complication (HCC)   . Headache   . Hyperlipidemia   . Hypertension   . Stroke (HCC)   . TIA (transient ischemic attack)    BP 136/80  (BP Location: Left Arm, Patient Position: Sitting, Cuff Size: Normal)   Pulse 64   Resp 14   SpO2 98%   Opioid Risk Score:   Fall Risk Score:  `1  Depression screen PHQ 2/9  Depression screen PHQ 2/9 04/10/2016  Decreased Interest 2  Down, Depressed, Hopeless 2  PHQ - 2 Score 4  Altered sleeping 3  Tired, decreased energy 3  Change in appetite 0  Feeling bad or failure about yourself  1  Trouble concentrating 3  Moving slowly or fidgety/restless 3  Suicidal thoughts 1  PHQ-9 Score 18  Difficult doing work/chores Extremely dIfficult    Review of Systems  Constitutional: Positive for appetite change.  HENT: Negative.   Eyes: Negative.   Respiratory: Negative.   Cardiovascular: Negative.   Gastrointestinal: Negative.   Endocrine: Negative.   Genitourinary: Negative.   Musculoskeletal: Positive for gait problem.  Skin: Negative.   Allergic/Immunologic: Negative.   Neurological: Positive for dizziness and weakness.  Hematological: Negative.   Psychiatric/Behavioral: Positive for confusion and dysphoric mood. The patient is nervous/anxious.        Objective:   Physical Exam  Constitutional: She is oriented to person, place, and time. She appears well-developed and well-nourished. No distress.  HENT:  Head: Normocephalic and atraumatic.  Eyes: Pupils are equal, round, and reactive to light. EOM are normal.  Neck: Normal range of motion.  Cardiovascular: Normal rate, regular rhythm, normal heart sounds and intact distal pulses.  No murmur heard. Pulmonary/Chest: Effort normal and breath sounds normal. No stridor. No respiratory distress. She has no wheezes.  Abdominal: Soft. Bowel sounds are normal. She exhibits no distension. There is no tenderness.  Musculoskeletal: Normal range of motion.  Neurological: She is alert and oriented to person, place, and time.  Skin: Skin is warm and dry. She is not diaphoretic.  Psychiatric: Her affect is blunt. Her speech is delayed  and slurred. She is slowed.   Speech dysarthric Oriented to person and place Decreased naming Increased latency of response Motor 3/5 Right delt bicep, 2- R triceps finger flexion and ext 3+/5 Right HF, KE      Assessment & Plan:  1.  Hx of multiple infarcts most recent Left CR and Head of caudate Pt will f/u with Neuro, cont HHPT, OT, SLP transition to OP  2.  Dysphagia improved will D/C PEG , referral to IR, f/u 3months with me

## 2017-11-05 ENCOUNTER — Encounter: Payer: Self-pay | Admitting: Cardiovascular Disease

## 2017-11-05 ENCOUNTER — Ambulatory Visit (INDEPENDENT_AMBULATORY_CARE_PROVIDER_SITE_OTHER): Payer: Medicaid Other | Admitting: Cardiovascular Disease

## 2017-11-05 VITALS — BP 126/64 | HR 75 | Ht 65.0 in | Wt 187.0 lb

## 2017-11-05 DIAGNOSIS — I208 Other forms of angina pectoris: Secondary | ICD-10-CM

## 2017-11-05 DIAGNOSIS — I10 Essential (primary) hypertension: Secondary | ICD-10-CM

## 2017-11-05 DIAGNOSIS — E7800 Pure hypercholesterolemia, unspecified: Secondary | ICD-10-CM | POA: Diagnosis not present

## 2017-11-05 NOTE — Assessment & Plan Note (Signed)
She of essential hypertension with blood pressure measured today 126/64.  I did review her blood pressure log which she brought with her that were revealed labile hypertension.  She is on metoprolol and lisinopril.

## 2017-11-05 NOTE — Progress Notes (Signed)
11/05/2017 CAMREIGH MICHIE   1965/11/30  [id number]  Primary Physician Jeanie Sewer Valrie Hart, MD Primary Cardiologist: Runell Gess MD Nicholes Calamity, [place]  HPI:  AANCHAL COPE is a 52 y.o.  moderately overweight married Caucasian female mother of one child who is accompanied by her son-in-law Maisie Fus today. I last saw her in the office 01/06/2017. She was referred by Dr. Everlena Cooper for cardiovascular evaluation because of recurrent strokes. He really wanted a loop recorder implanted. She has a history of true hypertension, diabetes and hyperlipidemia. She does not smoke. There is no family history. She's had multiple strokes but never has had a myocardial infarction. She has had extensive neurologic workup including CT, MRA, MRI, carotid Doppler studies and a transesophageal echo performed a year ago that showed no evidence of a PFO. She had an event monitor that showed no arrhythmias. She does get occasional chest pain several times a week with radiation to her jaw. She had a Myoview stress test performed 11/25/16 which was normal. Renal Doppler showed no evidence of renal artery stenosis. She continues to be hypertensive however.  Since I saw her almost a year ago she had recurrent strokes.  She has had a TEE that showed no evidence of PFO or right-to-left shunting.  A loop recorder was implanted by Dr. Johney Frame in April of this year that did not show any evidence of silent A. fib.  She is wheelchair-bound.  She apparently has orthostatic hypotension and is unable to walk.  She also has labile hypertension as evidenced by her blood pressure log.  Current Meds  Medication Sig  . clopidogrel (PLAVIX) 75 MG tablet Take 1 tablet (75 mg total) by mouth daily.  . divalproex (DEPAKOTE) 250 MG DR tablet Take 1 tablet (250 mg total) by mouth 2 (two) times daily.  . empagliflozin (JARDIANCE) 10 MG TABS tablet Take 10 mg by mouth daily.  Marland Kitchen escitalopram (LEXAPRO) 5 MG tablet Take 1 tablet (5 mg total)  by mouth at bedtime.  . insulin detemir (LEVEMIR) 100 UNIT/ML injection Inject 0.2 mLs (20 Units total) into the skin daily before breakfast.  . lisinopril (PRINIVIL,ZESTRIL) 10 MG tablet Take 1 tablet (10 mg total) by mouth 2 (two) times daily.  . metoprolol tartrate (LOPRESSOR) 25 MG tablet Take 0.5 tablets (12.5 mg total) by mouth 2 (two) times daily.  . polyethylene glycol (MIRALAX / GLYCOLAX) packet May take 17 g with 8 oz water per peg tube daily prn  . rosuvastatin (CRESTOR) 40 MG tablet Take 1 tablet (40 mg total) by mouth at bedtime.  . simethicone (MYLICON) 80 MG chewable tablet Chew 1 tablet (80 mg total) by mouth 4 (four) times daily.     Allergies  Allergen Reactions  . Erythromycin Itching  . Latex Itching    Social History   Socioeconomic History  . Marital status: Married    Spouse name: Not on file  . Number of children: Not on file  . Years of education: Not on file  . Highest education level: Not on file  Occupational History  . Occupation: Data processing manager  Social Needs  . Financial resource strain: Not on file  . Food insecurity:    Worry: Not on file    Inability: Not on file  . Transportation needs:    Medical: Not on file    Non-medical: Not on file  Tobacco Use  . Smoking status: Never Smoker  . Smokeless tobacco: Never Used  Substance and  Sexual Activity  . Alcohol use: No    Alcohol/week: 0.0 standard drinks    Comment: rare   . Drug use: No  . Sexual activity: Not Currently  Lifestyle  . Physical activity:    Days per week: Not on file    Minutes per session: Not on file  . Stress: Not on file  Relationships  . Social connections:    Talks on phone: Not on file    Gets together: Not on file    Attends religious service: Not on file    Active member of club or organization: Not on file    Attends meetings of clubs or organizations: Not on file    Relationship status: Not on file  . Intimate partner violence:    Fear of current or ex  partner: Not on file    Emotionally abused: Not on file    Physically abused: Not on file    Forced sexual activity: Not on file  Other Topics Concern  . Not on file  Social History Narrative  . Not on file     Review of Systems: General: negative for chills, fever, night sweats or weight changes.  Cardiovascular: negative for chest pain, dyspnea on exertion, edema, orthopnea, palpitations, paroxysmal nocturnal dyspnea or shortness of breath Dermatological: negative for rash Respiratory: negative for cough or wheezing Urologic: negative for hematuria Abdominal: negative for nausea, vomiting, diarrhea, bright red blood per rectum, melena, or hematemesis Neurologic: negative for visual changes, syncope, or dizziness All other systems reviewed and are otherwise negative except as noted above.    Blood pressure 126/64, pulse 75, height 5\' 5"  (1.651 m), weight 187 lb (84.8 kg).  General appearance: alert and no distress Neck: no adenopathy, no carotid bruit, no JVD, supple, symmetrical, trachea midline and thyroid not enlarged, symmetric, no tenderness/mass/nodules Lungs: clear to auscultation bilaterally Heart: regular rate and rhythm, S1, S2 normal, no murmur, click, rub or gallop Extremities: extremities normal, atraumatic, no cyanosis or edema Pulses: 2+ and symmetric Skin: Skin color, texture, turgor normal. No rashes or lesions Neurologic: Alert and oriented X 3, normal strength and tone. Normal symmetric reflexes. Normal coordination and gait  EKG sinus rhythm 75 with left ventricular hypertrophy and repolarization changes.  I personally reviewed this EKG.  ASSESSMENT AND PLAN:   Essential hypertension She of essential hypertension with blood pressure measured today 126/64.  I did review her blood pressure log which she brought with her that were revealed labile hypertension.  She is on metoprolol and lisinopril.  CVA (cerebrovascular accident due to intracerebral hemorrhage)  (HCC) History of multiple recurrent CVAs with encephalopathy.  She has had extensive neurologic evaluation by Dr. Pearlean BrownieSethi.  A TEE has shown no evidence of PFO or right-to-left shunting.  A loop recorder was implanted by Dr. Johney FrameAllred in April has showed no evidence of silent PAF.  Hyperlipidemia History of hyperlipidemia on statin therapy with lipid profile performed 07/09/2017 revealing total cholesterol 125, LDL 72 and HDL 33.      Runell GessJonathan J. [name] MD [facility] CampusFACP,FACC,FAHA, [facility] HospitalFSCAI 11/05/2017 10:59 AM

## 2017-11-05 NOTE — Patient Instructions (Signed)
Your physician wants you to follow-up in: ONE YEAR WITH DR [name] You will receive a reminder letter in the mail two months in advance. If you don't receive a letter, please call our office to schedule the follow-up appointment.   If you need a refill on your cardiac medications before your next appointment, please call your pharmacy.  

## 2017-11-05 NOTE — Assessment & Plan Note (Signed)
History of multiple recurrent CVAs with encephalopathy.  She has had extensive neurologic evaluation by Dr. Pearlean BrownieSethi.  A TEE has shown no evidence of PFO or right-to-left shunting.  A loop recorder was implanted by Dr. Johney FrameAllred in April has showed no evidence of silent PAF.

## 2017-11-05 NOTE — Assessment & Plan Note (Signed)
History of hyperlipidemia on statin therapy with lipid profile performed 07/09/2017 revealing total cholesterol 125, LDL 72 and HDL 33.

## 2017-11-19 ENCOUNTER — Ambulatory Visit (INDEPENDENT_AMBULATORY_CARE_PROVIDER_SITE_OTHER): Payer: Medicaid Other | Admitting: *Deleted

## 2017-11-19 ENCOUNTER — Encounter: Payer: Medicaid Other | Admitting: *Deleted

## 2017-11-19 DIAGNOSIS — I639 Cerebral infarction, unspecified: Secondary | ICD-10-CM | POA: Diagnosis not present

## 2017-11-20 NOTE — Progress Notes (Signed)
Carelink Summary Report / Loop Recorder 

## 2017-11-29 LAB — CUP PACEART REMOTE DEVICE CHECK
Date Time Interrogation Session: 20190804220906
Implantable Pulse Generator Implant Date: 20190430

## 2017-12-10 LAB — CUP PACEART REMOTE DEVICE CHECK
Implantable Pulse Generator Implant Date: 20190430
MDC IDC SESS DTM: 20190906221008

## 2017-12-23 ENCOUNTER — Ambulatory Visit (INDEPENDENT_AMBULATORY_CARE_PROVIDER_SITE_OTHER): Payer: Medicaid Other | Admitting: *Deleted

## 2017-12-23 DIAGNOSIS — I639 Cerebral infarction, unspecified: Secondary | ICD-10-CM

## 2017-12-23 NOTE — Progress Notes (Signed)
Carelink Summary Report / Loop Recorder 

## 2018-01-05 ENCOUNTER — Ambulatory Visit: Payer: Medicaid Other | Admitting: Neurology

## 2018-01-06 NOTE — Progress Notes (Signed)
NEUROLOGY FOLLOW UP OFFICE NOTE  Kelly Romero [id number]  HISTORY OF PRESENT ILLNESS: Kelly Romero is a 52 year old right-handed female with diabetes, hypertension and depression who presents for recurrent strokes.  She is accompanied by her husband who supplements history.  I  STROKE/ENCEPHALOPATHY: Update: Current medications: Plavix 75 mg daily, Crestor 40mg  She is currently with implantable loop recorder.  No significant arrhythmia has yet been identified.  Repeat modified barium swallow from 09/28/2017 revealed improved swallowing function.  Blood sugars were running high last night.  They are better now.  Appetite is not as good as it used to be but she eats what she wants.  She has had labile blood pressure which has been difficult to control.  She is followed by cardiology.  She has felt dizzy.  Blood pressures have been low this morning.  She has had increase in depression.  She has lack of motivation, only wants to lay in bed and sleep.  Not as physically active.  Crying easily.    History: She was in [facility] on 12/07/14 for stroke symptoms. She developed slurred speech and right arm numbness. She was agitated, yelling and screaming in the ED. She was given Ativan and Haldol. CT of head was unremarkable. Repeat CT of head two days later was negative. CXR negative. Urine tox screen was positive for opioids. She was found to have severally elevated blood pressure with systolic at 240 and higher. Hgb A1c was 12 with serum glucose level of 400. She did not have fever or leukocytosis, and UA was borderline with trace leukocytes but negative nitrite. She was treated with 5 days of IV Rocephin. Troponins were negative. Etiology was unknown but TIA was suspected as a possibility.  She had a similar stroke-like episode on 02/22/15.  She woke up with headache and later developed slurred speech.  She was admitted to [facility], where MRI of brain  revealed 2 mm left occipital subacute infarct.  2D echo was performed, which appeared unremarkable, but didn't make mention of a PFO.  Carotid doppler again showed no hemodynamically significant ICA stenosis.  Hypercoagulable panel, ANA, lupus panel, antiextractable nuclear antigen were negative.  LDL was 130.  Plavix was added to her ASA.  Crestor was increased to 40mg  daily.  Labs from August 2016 include Sed Rate 30, ANA negative, ANCA screen negative, antiextractable nuclear antigen (RNP abs, Smith abs) negative.  She had a TEE on 04/19/15, which revealed EF 60-65% with no PFO or atrial septal defect or other cardiac source of emboli.  After 3 months on dual antiplatelet therapy, she was continued on Plavix alone.  She was on amitriptyline 25mg  at bedtime to help with depression and headache.   She was admitted to [facility] between 08/15/15 and 08/19/15 for right-sided (however some notes state left-sided) weakness and numbness associated with forgetfulness and agitation.  Blood pressure was elevated.  NIHSS was 4.  Due to minimal symptoms, she did not receive tPA.  Urine tox screen was negative.  CT of head showed no acute findings.  MRI of brain with and without contrast revealed possible small acute infarct in the posterior right MCA territory.  However, it appears on previous imaging, which suggests it is likely an old finding.  Neurology believed it to be an old infarct, as it apparently was seen on a prior MRI in February 2017.  TTE again was unremarkable with EF of 65-70%.  LDL was 231.  Hgb A1c was 10.5.  Exact  cause of encephalopathy was unclear.  She underwent an EEG on 08/18/15, which revealed mild to moderate generalized background slowing but no epileptiform activity.  Neurology felt symptoms may be psychogenic.  She was started on Depakote 500mg  twice daily for possible seizures.    She was maintained on ASA 81mg  and Plavix, as well as Rosuvastatin 20mg .  She reports word-finding difficulties, which has  been ongoing for several months.   She was readmitted to [facility] from 12/26/15 to 12/22/2015 for altered mental status, fever of 103 and elevated blood pressure with systolic in 220s.  MRI of brain revealed small acute and subacute infarcts in the bilateral cerebral hemisphere.  Due to fever, she was started on IV antibiotics and acyclovir.  UA and urine tox screen was negative.  Blood cultures showed 1 of 2 positive for gram +, likely contaminated.  Endocarditis was not suspected.  She underwent LP.  CSF cell count was 1, gram stain negative, protein 49, glucose 98, oligoclonal bands negative, fungal cultures, CMV, VZV, VDRL and HSV were negative.  In the serum, HIV and RPR were nonreactive.  Arbovirus panel was negative.  ACE was 26.  Hypercoagulable panel (including homocysteine, lupus anticoagulant, beta-2-glycoprotein, cardiolipin antibodies) was negative.  TTE showed normal LVEF 55-60% and TCD showed clinically insignificant right to left shunt with valsalva only.  Cerebral angiogram revealed mild athersclerosis with 65-70% stenosis of left MCA superior division, severe stenosis of proximal PCA P1 segment and tapered stenosis of distal right pericallosal artery.  Hgb A1c was 13.  LDL was 153.  Lower extremity doppler revealed distal DVT in the right lower extremity.  Plavix was switched to ASA 325mg  daily.  DVT is being monitored and managed by her PCP.  Recommendation was to recheck venous ultrasound every 2 weeks and if there is any proximal extension, then the switch to anticoagulation.  30 day Holter monitor from late 2017 revealed no arrhythmia.  She was admitted to [facility] from on 10/02/16 for headache, dizziness and hypertension.  CT of head from 09/30/16 showed a subacute to chronic infarct in the left parietooccipital junction.  CTA of head from 10/01/16  demonstrated high-grade stenosis or occlusion at the left P2 segment bifurcation with nonvisualization of the superior left P3  segment. MRI of brain without contrast from 10/02/16 demonstrated a small acute infarct in the right body of the corpus callosum.  Carotid doppler showed moderate heterogeneous and calcified plaque without hemodynamically significant stenosis.  2D echo showed EF 60-65% with no PFO or other cardiac source of emboli.  Telemetry revealed no atrial fibrillation.  LDL was reportedly 239.  Rosuvastatin 40mg  was changed to atorvastatin 80mg .  She was maintained on ASA 325mg  and Plavix 75mg .  She was also treated for UTI.  She had a nuclear stress test on 11/26/16 which was normal with EF 55-65%.  For uncontrolled hypertension, she had a renal ultrasound on 11/25/16, which was normal.  She was admitted to [facility] in [place] from 04/27/17 to 04/30/17 for stroke.   She presented with one week history of intermittent slurred speech.  CT of head showed no acute abnormalities.  CTA of head showed multiple stable segments of mild to moderate stenosis involving the ACA, MCA and PCA distributions as well as stable severe stenosis of left M2.  Compared to prior CTA from July 2018, she demonstrated interval occlusion of right PCA distal calcarine branch.  CTA of neck showed bilateral 60% proximal ICA stenosis.  MRI of brain with and  without contrast showed multiple subacute infarcts involving the the right lateral lenticulostriate territory, right parietal region and cortical cerebral hemispheres.  TTE showed EF 65-70% with negative bubble study.  LDL was 347.  Hgb A1c was 13.6.  She reportedly had been noncompliant with her medications. She had stopped taking her medication for a month due to depression.  She is back on all of her medications.  It was believed that her strokes were secondary to intracranial large vessel disease rather than cardiac source.    She was admitted to [facility] from 07/08/17 to 07/10/17 for slurred speech and right facial droop.  MRI of brain showed small acute infarcts in the left  hemisphere.  Carotid doppler showed no hemodynamically significant stenosis.  Echocardiogram showed normal EF with no evidence of PFO.  LDL was 72.  A1c was 7.3.  She was advised to continue ASA and Plavix and Crestor 40mg .  She was admitted to [facility] from 07/11/17 to 07/13/17 for another stroke.  She presented with worsening dysarthria and new right facial and right upper extremity numbness.  Dr. Pearlean Brownie happened to be the hospital physician.  CT head showed stable extensive chronic small vessel disease but no acute findings.  MRI of brain showed increased size of acute infarct within left corona radiate and caudate body, interval infarction of the penumbra as well as stable scattered infarcts from recent stroke.Marland Kitchen She was continued on dual antiplatelet therapy for 3 months, however she has only been on the Plavix alone.  Due to dysphagia, she required a PEG insertion.  While in rehab, she had syncopal episodes due to orthostatic hypotension.  Her blood pressure fluctuates from 90s/60s to 150s systolic.  She had an implantable loop recorder inserted.  II Cognitive impairment/aphasia: She underwent neuropsychological testing on 03/10/16.  She exhibited adjustment disorder with depression and anxiety.  However, a cognitive diagnosis could not be made due to poor effort on testing.  It was recommended to continue treating anxiety and depression.  III  HEADACHES: Update: She has a headache today but generally not frequent Current NSAIDS:  no Current analgesics:  Ibuprofen, Tylenol Current triptans:  no Current anti-emetic:  no Current muscle relaxants:  no Current anti-anxiolytic:  no Current sleep aide:  no Current Antihypertensive medications:  lisinopril Current Antidepressant medications:  escitalopram 10mg  Current Anticonvulsant medications:  divalproex DR 250mg  twice daily Current Vitamins/Herbal/Supplements:  Magnesium citrate 400mg  Current Antihistamines/Decongestants:  no Other  therapy:  No  History: Onset: July 2016. Sudden onset. Location: Right sided, from front to back of head. Initially had pain behind right ear. Quality: squeezing Initial intensity: 7-8/10 Aura: no Prodrome: no Associated symptoms: Some photophobia and phonophobia. Sometimes nausea. She denies visual disturbance. Initial Duration: All day Initial Frequency: Daily. Sometimes wakes her up at night Triggers/exacerbating factors: Loud noise Relieving factors: Laying in a cool dark quiet room  Past abortive medication: Tylenol, Excedrin, Percocet, ibuprofen 800mg  Past preventative medication: amitriptyline 25mg  Other past therapy: none  Sed Rate was 30.  MRI of the brain from 11/09/14 showed chronic small vessel ischemic changes with an incidental tiny subacute infarct in the right frontal lobe.  PAST MEDICAL HISTORY: Past Medical History:  Diagnosis Date  . Diabetes mellitus without complication (HCC)   . Headache   . Hyperlipidemia   . Hypertension   . Stroke (HCC)   . TIA (transient ischemic attack)     MEDICATIONS: Current Outpatient Medications on File Prior to Visit  Medication Sig Dispense Refill  .  clopidogrel (PLAVIX) 75 MG tablet Take 1 tablet (75 mg total) by mouth daily. 30 tablet 0  . divalproex (DEPAKOTE) 250 MG DR tablet Take 1 tablet (250 mg total) by mouth 2 (two) times daily. 60 tablet 0  . empagliflozin (JARDIANCE) 10 MG TABS tablet Take 10 mg by mouth daily.    Marland Kitchen escitalopram (LEXAPRO) 5 MG tablet Take 1 tablet (5 mg total) by mouth at bedtime. 30 tablet 0  . insulin detemir (LEVEMIR) 100 UNIT/ML injection Inject 0.2 mLs (20 Units total) into the skin daily before breakfast. 10 mL 11  . lisinopril (PRINIVIL,ZESTRIL) 10 MG tablet Take 1 tablet (10 mg total) by mouth 2 (two) times daily. 60 tablet 0  . metoprolol tartrate (LOPRESSOR) 25 MG tablet Take 0.5 tablets (12.5 mg total) by mouth 2 (two) times daily. 30 tablet 0  . polyethylene glycol  (MIRALAX / GLYCOLAX) packet May take 17 g with 8 oz water per peg tube daily prn 30 each 0  . rosuvastatin (CRESTOR) 40 MG tablet Take 1 tablet (40 mg total) by mouth at bedtime. 30 tablet 0  . simethicone (MYLICON) 80 MG chewable tablet Chew 1 tablet (80 mg total) by mouth 4 (four) times daily. 300 tablet 0   No current facility-administered medications on file prior to visit.     ALLERGIES: Allergies  Allergen Reactions  . Erythromycin Itching  . Latex Itching    FAMILY HISTORY: Family History  Problem Relation Age of Onset  . Stroke Father        36s  . Heart attack Father 28  . COPD Mother   . Heart failure Brother   . Cancer Maternal Grandmother        unknown   . COPD Unknown   . Heart failure Maternal Grandfather   . Hypertension Maternal Grandfather   . Cancer Paternal Grandfather        lung  . Diabetes Paternal Grandmother    SOCIAL HISTORY: Social History   Socioeconomic History  . Marital status: Married    Spouse name: Not on file  . Number of children: Not on file  . Years of education: Not on file  . Highest education level: Not on file  Occupational History  . Occupation: Data processing manager  Social Needs  . Financial resource strain: Not on file  . Food insecurity:    Worry: Not on file    Inability: Not on file  . Transportation needs:    Medical: Not on file    Non-medical: Not on file  Tobacco Use  . Smoking status: Never Smoker  . Smokeless tobacco: Never Used  Substance and Sexual Activity  . Alcohol use: No    Alcohol/week: 0.0 standard drinks    Comment: rare   . Drug use: No  . Sexual activity: Not Currently  Lifestyle  . Physical activity:    Days per week: Not on file    Minutes per session: Not on file  . Stress: Not on file  Relationships  . Social connections:    Talks on phone: Not on file    Gets together: Not on file    Attends religious service: Not on file    Active member of club or organization: Not on file     Attends meetings of clubs or organizations: Not on file    Relationship status: Not on file  . Intimate partner violence:    Fear of current or ex partner: Not on file    Emotionally  abused: Not on file    Physically abused: Not on file    Forced sexual activity: Not on file  Other Topics Concern  . Not on file  Social History Narrative  . Not on file    REVIEW OF SYSTEMS: Constitutional: No fevers, chills, or sweats, no generalized fatigue, change in appetite Eyes: No visual changes, double vision, eye pain Ear, nose and throat: No hearing loss, ear pain, nasal congestion, sore throat Cardiovascular: No chest pain, palpitations Respiratory:  No shortness of breath at rest or with exertion, wheezes GastrointestinaI: No nausea, vomiting, diarrhea, abdominal pain, fecal incontinence Genitourinary:  No dysuria, urinary retention or frequency Musculoskeletal:  No neck pain, back pain Integumentary: No rash, pruritus, skin lesions Neurological: as above Psychiatric: No depression, insomnia, anxiety Endocrine: No palpitations, fatigue, diaphoresis, mood swings, change in appetite, change in weight, increased thirst Hematologic/Lymphatic:  No purpura, petechiae. Allergic/Immunologic: no itchy/runny eyes, nasal congestion, recent allergic reactions, rashes  PHYSICAL EXAM: Blood pressure (!) 82/50, pulse 64, height 5\' 5"  (1.651 m), weight 170 lb (77.1 kg), SpO2 98 %. General: No acute distress.  Patient appears well-groomed.   Head:  Normocephalic/atraumatic Eyes:  Fundi examined but not visualized Neck: supple, no paraspinal tenderness, full range of motion Heart:  Regular rate and rhythm Lungs:  Clear to auscultation bilaterally Back: No paraspinal tenderness Neurological Exam: Alert and oriented to person, place, and time (except said year 2018).  Attention span and concentration mildly impaired.  Recent memory mildly impaired.  Remote memory intact.  Fund of knowledge intact.  Speech  slowed and dysarthric.  Difficulty following complex commands across midline.  Otherwise, language intact.  Right homonymous hemianopsia, flattening of right nasolabial fold.  Otherwise, CN II to XII intact.  Bulk and tone normal, muscle strength 5-/5 right upper and lower extremities, 5/5 left upper and lower extremities.  Decreased finger tapping bilaterally but worse on right.  Sensation to light touch, temperature and vibration intact.  Deep tendon reflexes 2+ throughout.  Finger-to-nose testing intact but slowed on the right.  Nonambulatory.  IMPRESSION: 1.  Recurrent strokes, cryptogenic.  Residual cognitive impairment and right homonymous hemianopsia. She has uncontrolled diabetes, hyperlipidemia, hypertension and significant intracranial atherosclerotic disease which all could have contributed to her strokes. However, due to the multiple infarcts, I am wondering if further cardiac workup with loop recorder is needed.Other stroke workup has been unremarkable (including TTE/TEE, 30 day Holter monitor, carotid doppler and hypercoagulable panel). She had a DVT but no PFO. 2.  Vascular dementia 3.  Migraine without aura, without status migrainosus, not intractable 4.  Tension type headaches, non-intractable 5.  Hypertension 6.  Orthostatic hypotension 7.  Hyperlipidemia 8.  Depression and anxiety  PLAN: 1.  Continue Plavix for secondary stroke prevention 2.  Continue Crestor.  3.  Optimize glycemic control 4.  Optimize labile blood pressure control 5.  Continue divalproex DR 250mg  twice daily.  Check CBC and CMP today and again in 6 months. 6.  Increase escitalopram to 10mg  daily 7.  Restart home exercises.  Follow up with Dr. Wynn Banker 8.  Follow up in 6 months.  25 minutes spent face to face with patient, over 50% spent discussing management.  Shon Millet, DO  CC: Gwendlyn Deutscher II, MD

## 2018-01-07 ENCOUNTER — Other Ambulatory Visit (INDEPENDENT_AMBULATORY_CARE_PROVIDER_SITE_OTHER): Payer: Medicaid Other

## 2018-01-07 ENCOUNTER — Ambulatory Visit: Payer: Medicaid Other | Admitting: Neurology

## 2018-01-07 ENCOUNTER — Encounter: Payer: Self-pay | Admitting: Neurology

## 2018-01-07 VITALS — BP 82/50 | HR 64 | Ht 65.0 in | Wt 170.0 lb

## 2018-01-07 DIAGNOSIS — I69351 Hemiplegia and hemiparesis following cerebral infarction affecting right dominant side: Secondary | ICD-10-CM

## 2018-01-07 DIAGNOSIS — H53469 Homonymous bilateral field defects, unspecified side: Secondary | ICD-10-CM

## 2018-01-07 DIAGNOSIS — F0150 Vascular dementia without behavioral disturbance: Secondary | ICD-10-CM

## 2018-01-07 DIAGNOSIS — E785 Hyperlipidemia, unspecified: Secondary | ICD-10-CM

## 2018-01-07 DIAGNOSIS — Z794 Long term (current) use of insulin: Secondary | ICD-10-CM

## 2018-01-07 DIAGNOSIS — I639 Cerebral infarction, unspecified: Secondary | ICD-10-CM

## 2018-01-07 DIAGNOSIS — I951 Orthostatic hypotension: Secondary | ICD-10-CM

## 2018-01-07 DIAGNOSIS — I69398 Other sequelae of cerebral infarction: Secondary | ICD-10-CM | POA: Diagnosis not present

## 2018-01-07 DIAGNOSIS — E118 Type 2 diabetes mellitus with unspecified complications: Secondary | ICD-10-CM

## 2018-01-07 DIAGNOSIS — Z79899 Other long term (current) drug therapy: Secondary | ICD-10-CM | POA: Diagnosis not present

## 2018-01-07 DIAGNOSIS — I10 Essential (primary) hypertension: Secondary | ICD-10-CM

## 2018-01-07 LAB — CBC
HEMATOCRIT: 34.1 % — AB (ref 36.0–46.0)
Hemoglobin: 11.3 g/dL — ABNORMAL LOW (ref 12.0–15.0)
MCHC: 33.1 g/dL (ref 30.0–36.0)
MCV: 88.7 fl (ref 78.0–100.0)
PLATELETS: 229 10*3/uL (ref 150.0–400.0)
RBC: 3.84 Mil/uL — ABNORMAL LOW (ref 3.87–5.11)
RDW: 15 % (ref 11.5–15.5)
WBC: 5.7 10*3/uL (ref 4.0–10.5)

## 2018-01-07 LAB — COMPREHENSIVE METABOLIC PANEL
ALBUMIN: 3.4 g/dL — AB (ref 3.5–5.2)
ALT: 8 U/L (ref 0–35)
AST: 13 U/L (ref 0–37)
Alkaline Phosphatase: 46 U/L (ref 39–117)
BUN: 23 mg/dL (ref 6–23)
CHLORIDE: 102 meq/L (ref 96–112)
CO2: 31 meq/L (ref 19–32)
Calcium: 10.1 mg/dL (ref 8.4–10.5)
Creatinine, Ser: 1.16 mg/dL (ref 0.40–1.20)
GFR: 51.74 mL/min — AB (ref >60.00)
Glucose, Bld: 160 mg/dL — ABNORMAL HIGH (ref 70–99)
Potassium: 3.5 mEq/L (ref 3.5–5.1)
Sodium: 140 mEq/L (ref 135–145)
Total Bilirubin: 0.3 mg/dL (ref 0.2–1.2)
Total Protein: 6.4 g/dL (ref 6.0–8.3)

## 2018-01-07 LAB — CUP PACEART REMOTE DEVICE CHECK
Implantable Pulse Generator Implant Date: 20190430
MDC IDC SESS DTM: 20191009223603

## 2018-01-07 MED ORDER — ESCITALOPRAM OXALATE 10 MG PO TABS
10.0000 mg | ORAL_TABLET | Freq: Every day | ORAL | 5 refills | Status: DC
Start: 2018-01-07 — End: 2018-07-18

## 2018-01-07 NOTE — Patient Instructions (Addendum)
1.  Continue depakote 250mg  twice daily.  Check CBC and CMP today and again in 6 months. 2.  Increased escitalopram to 10mg  daily 3.  Continue Plavix and Crestor 4.  Restart home exercises 5.  Follow up in 6 months.  Your provider has requested that you have labwork completed today. Please go to [facility] ([id number]) on the second floor of this building before leaving the office today. You do not need to check in. If you are not called within 15 minutes please check with the front desk.   You will also need to come one week prior to your follow up appointment in 6 months for labs. Come to our office and register and then proceed to the 2nd floor for labs. Keep in mind the lab is closed daily from 12:15 - 1:30pm for lunch

## 2018-01-21 ENCOUNTER — Encounter: Payer: Medicaid Other | Attending: Physical Medicine & Rehabilitation

## 2018-01-21 ENCOUNTER — Ambulatory Visit: Payer: Medicaid Other | Admitting: Physical Medicine & Rehabilitation

## 2018-01-21 ENCOUNTER — Encounter: Payer: Self-pay | Admitting: Physical Medicine & Rehabilitation

## 2018-01-21 VITALS — BP 107/71 | HR 69 | Ht 65.0 in | Wt 187.0 lb

## 2018-01-21 DIAGNOSIS — F0150 Vascular dementia without behavioral disturbance: Secondary | ICD-10-CM | POA: Diagnosis not present

## 2018-01-21 DIAGNOSIS — E119 Type 2 diabetes mellitus without complications: Secondary | ICD-10-CM | POA: Diagnosis not present

## 2018-01-21 DIAGNOSIS — I69359 Hemiplegia and hemiparesis following cerebral infarction affecting unspecified side: Secondary | ICD-10-CM

## 2018-01-21 DIAGNOSIS — I69320 Aphasia following cerebral infarction: Secondary | ICD-10-CM | POA: Insufficient documentation

## 2018-01-21 DIAGNOSIS — I69391 Dysphagia following cerebral infarction: Secondary | ICD-10-CM

## 2018-01-21 DIAGNOSIS — G903 Multi-system degeneration of the autonomic nervous system: Secondary | ICD-10-CM

## 2018-01-21 DIAGNOSIS — I951 Orthostatic hypotension: Secondary | ICD-10-CM | POA: Diagnosis not present

## 2018-01-21 DIAGNOSIS — I69351 Hemiplegia and hemiparesis following cerebral infarction affecting right dominant side: Secondary | ICD-10-CM | POA: Insufficient documentation

## 2018-01-21 DIAGNOSIS — E785 Hyperlipidemia, unspecified: Secondary | ICD-10-CM | POA: Insufficient documentation

## 2018-01-21 DIAGNOSIS — I10 Essential (primary) hypertension: Secondary | ICD-10-CM | POA: Insufficient documentation

## 2018-01-21 NOTE — Progress Notes (Signed)
Subjective:    Patient ID: Kelly Romero, female    DOB: 12/22/1965, 52 y.o.   MRN: [id number] 53 year old female with history of HTN, T2DM, depression, HA, multiple CVAs most recent at [facility] on 04/2017 and [facility] with discharge on 07/10/17 after L- CVA. She was readmitted on 07/11/17 with worsening of slurred speech and right sided weakness. MRI brain showed few new strokes in parasagittal cortical and subcortical left frontal lobe. Work up was negative for vasculitis, hypercoagulopathy and Dr. Pearlean Brownie felt tha stroke due to advanced intracranial atherosclerosis. Loop recorder placed by Dr. Johney Frame and DAPT recommended X 3 months  Admit date: 07/13/2017 Discharge date: 08/06/2017 admitted to rehab 07/13/2017 for inpatient therapies to consist of PT, ST and OT at least three hours five days a week. Past admission physiatrist, therapy team and rehab RN have worked together to provide customized collaborative inpatient rehab. Hospital course has been significant for po intake as well as orthostatic hypotension with supine hypertension. Cardiology was consulted for input and recommended IVF for hydration X 24 hours with resumption of BB. She continued to have issues with minimal po intake and IVF were added at nights for hydration. She also refused multiple meds due to dislike of crushed medications as well as dislike of thickened liquids. She also continued to have severe orthostatic changes and  BP medications were further adjusted by cardiology. Dr. Eden Emms recommended not using florinef or midodrine with recommendations to  Improve nutritional status as well as aggressive therapy.  HPI Per son not drinking enough, still using G Tube  Appetite also dimishing per son  Seen by Neuro 2wks ago, lexapro was increased  HH finished 1-2 mo ago per son  Right hand "drawing" and causing pain, per son pt has not been stretching or doing ROM  Per son patient has 1 minute standing tolerance before orthostatic  hypotension sets in.  Pain Inventory Average Pain 5 Pain Right Now 5 My pain is intermittent and sharp  In the last 24 hours, has pain interfered with the following? General activity 3 Relation with others 2 Enjoyment of life 3 What TIME of day is your pain at its worst? na Sleep (in general) Fair  Pain is worse with: na Pain improves with: na Relief from Meds: na  Mobility ability to climb steps?  no do you drive?  no  Function disabled: date disabled .  Neuro/Psych bladder control problems bowel control problems weakness numbness trouble walking dizziness confusion depression anxiety  Prior Studies Any changes since last visit?  no  Physicians involved in your care Any changes since last visit?  no   Family History  Problem Relation Age of Onset  . Stroke Father        40s  . Heart attack Father 59  . COPD Mother   . Heart failure Brother   . Cancer Maternal Grandmother        unknown   . COPD Unknown   . Heart failure Maternal Grandfather   . Hypertension Maternal Grandfather   . Cancer Paternal Grandfather        lung  . Diabetes Paternal Grandmother    Social History   Socioeconomic History  . Marital status: Married    Spouse name: Not on file  . Number of children: Not on file  . Years of education: Not on file  . Highest education level: Not on file  Occupational History  . Occupation: Data processing manager  Social Needs  . Financial  resource strain: Not on file  . Food insecurity:    Worry: Not on file    Inability: Not on file  . Transportation needs:    Medical: Not on file    Non-medical: Not on file  Tobacco Use  . Smoking status: Never Smoker  . Smokeless tobacco: Never Used  Substance and Sexual Activity  . Alcohol use: No    Alcohol/week: 0.0 standard drinks    Comment: rare   . Drug use: No  . Sexual activity: Not Currently  Lifestyle  . Physical activity:    Days per week: Not on file    Minutes per session: Not on  file  . Stress: Not on file  Relationships  . Social connections:    Talks on phone: Not on file    Gets together: Not on file    Attends religious service: Not on file    Active member of club or organization: Not on file    Attends meetings of clubs or organizations: Not on file    Relationship status: Not on file  Other Topics Concern  . Not on file  Social History Narrative  . Not on file   Past Surgical History:  Procedure Laterality Date  . APPENDECTOMY    . CHOLECYSTECTOMY    . FOOT GANGLION EXCISION    . HERNIA REPAIR    . IR GASTROSTOMY TUBE MOD SED  07/30/2017  . IR GENERIC HISTORICAL  12/30/2015   IR ANGIO INTRA EXTRACRAN SEL COM CAROTID INNOMINATE BILAT MOD SED 12/30/2015 Julieanne Cotton, MD [facility]INTER[facility]  . IR GENERIC HISTORICAL  12/30/2015   IR ANGIO VERTEBRAL SEL VERTEBRAL BILAT MOD SED 12/30/2015 Julieanne Cotton, MD [facility]  . LOOP RECORDER INSERTION N/A 07/13/2017   Procedure: LOOP RECORDER INSERTION;  Surgeon: Hillis Range, MD;  Location: [facility] INVASIVE CV LAB;  Service: Cardiovascular;  Laterality: N/A;  . TEE WITHOUT CARDIOVERSION N/A 04/19/2015   Procedure: TRANSESOPHAGEAL ECHOCARDIOGRAM (TEE);  Surgeon: Jake Bathe, MD;  Location: [facility] ENDOSCOPY;  Service: Cardiovascular;  Laterality: N/A;  . VAGINAL HYSTERECTOMY     Past Medical History:  Diagnosis Date  . Diabetes mellitus without complication (HCC)   . Headache   . Hyperlipidemia   . Hypertension   . Stroke (HCC)   . TIA (transient ischemic attack)    There were no vitals taken for this visit.  Opioid Risk Score:   Fall Risk Score:  `1  Depression screen PHQ 2/9  Depression screen PHQ 2/9 04/10/2016  Decreased Interest 2  Down, Depressed, Hopeless 2  PHQ - 2 Score 4  Altered sleeping 3  Tired, decreased energy 3  Change in appetite 0  Feeling bad or failure about yourself  1  Trouble concentrating 3  Moving slowly or fidgety/restless 3  Suicidal thoughts 1  PHQ-9 Score 18    Difficult doing work/chores Extremely dIfficult     Review of Systems  Constitutional: Negative.   HENT: Negative.   Eyes: Negative.   Respiratory: Negative.   Cardiovascular: Negative.   Gastrointestinal: Positive for constipation.  Endocrine: Negative.   Genitourinary: Positive for difficulty urinating.  Musculoskeletal: Positive for gait problem.  Skin: Negative.   Allergic/Immunologic: Negative.   Neurological: Positive for dizziness, weakness, numbness and headaches.  Hematological: Negative.   Psychiatric/Behavioral: Positive for confusion and dysphoric mood. The patient is nervous/anxious.   All other systems reviewed and are negative.      Objective:   Physical Exam  Constitutional: She appears well-developed and  well-nourished.  HENT:  Head: Normocephalic and atraumatic.  Eyes: Pupils are equal, round, and reactive to light. EOM are normal.  Neck: Normal range of motion.  Abdominal: Soft. She exhibits no distension. There is no tenderness.  G tube site C/D/I  Neurological: Gait abnormal.  Motor 4- at the right deltoid bicep tricep grip hip flexor knee extensor ankle dorsiflexor 5 on the left deltoid bicep tricep grip hip flexor knee extensor ankle dorsiflexor Patient in wheelchair, gait not tested secondary to no assistive device with her.  Nursing note and vitals reviewed.         Assessment & Plan:  #1.  History of multiple CVAs with residual right hemiparesis and poor balance.  Cognitive deficits related to stroke Will order outpatient PT OT speech  2.  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  Multi-infarct dementia no agitation but has problems with attention concentration, processing speed, problem-solving, judgment Speech should help address. Consider neuropsych evaluation  4.  Severe dysphagia, no worsening of her swallow however intake is poor this is likely multifactorial related to her vascular dementia and depression  5.   Autonomic neuropathy with orthostatic hypotension, may need cardiology follow-up, at one point she was on Midodrine  Physical medicine rehabilitation follow-up in 2 months.  She may have some insurance limits for outpatient therapy.  If this is the case may need to start outpatient in January 2020

## 2018-01-21 NOTE — Patient Instructions (Signed)
Eat and drink more!  Please do home exercises!  Outpatient therapy [facility] will call you for appt

## 2018-01-24 ENCOUNTER — Ambulatory Visit (INDEPENDENT_AMBULATORY_CARE_PROVIDER_SITE_OTHER): Payer: Medicaid Other | Admitting: *Deleted

## 2018-01-24 DIAGNOSIS — I639 Cerebral infarction, unspecified: Secondary | ICD-10-CM | POA: Diagnosis not present

## 2018-01-25 NOTE — Progress Notes (Signed)
Carelink Summary Report / Loop Recorder 

## 2018-02-28 ENCOUNTER — Ambulatory Visit (INDEPENDENT_AMBULATORY_CARE_PROVIDER_SITE_OTHER): Payer: Medicaid Other

## 2018-02-28 DIAGNOSIS — I639 Cerebral infarction, unspecified: Secondary | ICD-10-CM | POA: Diagnosis not present

## 2018-02-28 NOTE — Progress Notes (Signed)
Carelink Summary Report / Loop Recorder 

## 2018-03-12 LAB — CUP PACEART REMOTE DEVICE CHECK
MDC IDC PG IMPLANT DT: 20190430
MDC IDC SESS DTM: 20191111231104

## 2018-03-18 ENCOUNTER — Encounter: Payer: Self-pay | Admitting: Internal Medicine

## 2018-03-21 ENCOUNTER — Ambulatory Visit: Payer: Medicaid Other | Attending: Physical Medicine & Rehabilitation

## 2018-03-21 ENCOUNTER — Ambulatory Visit: Payer: Medicaid Other | Admitting: Speech Pathology

## 2018-03-21 ENCOUNTER — Ambulatory Visit: Payer: Medicaid Other | Admitting: Occupational Therapy

## 2018-03-21 VITALS — BP 124/47 | HR 65

## 2018-03-21 DIAGNOSIS — M6281 Muscle weakness (generalized): Secondary | ICD-10-CM

## 2018-03-21 NOTE — Therapy (Signed)
[facility] MAIN [facility] SERVICES [address] [place], [place], [address] Phone: [phone]   Fax:  [phone]  Patient Details  Name: Kelly Romero MRN: [id number] Date of Birth: 05/27/1965 Referring Provider:  Erick Colace, MD  Encounter Date: 03/21/2018  Pt. arrived for therapy evaluations, however BP was low in sitting. Pt.'s son reports having a episodes of Hypotension with "blacking out". The OT initial evaluation has been postponed until after her scheduled Cardiology appointment in order to determine the most appropriate treatment plan of care.  Olegario Messier, MS, OTR/L 03/21/2018, 11:54 AM  [facility] MAIN [facility] SERVICES [address] [place], [place], [address] Phone: [phone]   Fax:  [phone]

## 2018-03-21 NOTE — Therapy (Signed)
[facility] [address] [place], [place], [address] Phone: [phone]   Fax:  [phone]  Physical Therapy Evaluation (No Charge Visit)  Patient Details  Name: Kelly Romero MRN: [id number] Date of Birth: 12/25/1965 No data recorded  Encounter Date: 03/21/2018    Past Medical History:  Diagnosis Date  . Diabetes mellitus without complication (HCC)   . Headache   . Hyperlipidemia   . Hypertension   . Stroke (HCC)   . TIA (transient ischemic attack)     Past Surgical History:  Procedure Laterality Date  . APPENDECTOMY    . CHOLECYSTECTOMY    . FOOT GANGLION EXCISION    . HERNIA REPAIR    . IR GASTROSTOMY TUBE MOD SED  07/30/2017  . IR GENERIC HISTORICAL  12/30/2015   IR ANGIO INTRA EXTRACRAN SEL COM CAROTID INNOMINATE BILAT MOD SED 12/30/2015 Julieanne CottonSanjeev Deveshwar, MD [facility]INTER[facility]  . IR GENERIC HISTORICAL  12/30/2015   IR ANGIO VERTEBRAL SEL VERTEBRAL BILAT MOD SED 12/30/2015 Julieanne CottonSanjeev Deveshwar, MD [facility]  . LOOP RECORDER INSERTION N/A 07/13/2017   Procedure: LOOP RECORDER INSERTION;  Surgeon: Hillis RangeAllred, James, MD;  Location: [facility] INVASIVE CV LAB;  Service: Cardiovascular;  Laterality: N/A;  . TEE WITHOUT CARDIOVERSION N/A 04/19/2015   Procedure: TRANSESOPHAGEAL ECHOCARDIOGRAM (TEE);  Surgeon: Jake BatheMark C Skains, MD;  [facility];  Service: Cardiovascular;  Laterality: N/A;  . VAGINAL HYSTERECTOMY      Vitals:   03/21/18 1042 03/21/18 1113  BP: (!) 71/43 (!) 124/47  Pulse: 71 65  SpO2: 100% 100%                Objective measurements completed on examination: See above findings.      Pt arrived for PT evaluation having been referred by Dr. Jodean LimaKirstein's. Upon arrival son-in-law reports that pt is unable to stand for more than 30 seconds without syncope. This has been experiencing since her CVA in early 2019. Son-in-law states that they saw a cardiologist during their inpatient admission who  reported he couldn't do anything for her. During her most recent visit with Dr. Wynn BankerKirsteins in November he thought she may need to see a cardiologist. She saw her PCP, Dr. Jeanie Seweredding, last Monday for a UTI and he started her on an antibiotic. He also thought she should see a cardiologist and told pt and son-in-law that he would initiate a referral. They have not received a call for an appointment yet. Blood pressure readings obtained in wheelchair and are 71/43 mmHg. Pt reports feeling mildly dizzy. She does struggle to communicate her symptoms due to aphasia. Pt starts to sob halfway through the conversation with son-in-law due to having a BM in her diaper. Therapist utilized [employer] lift to get pt on mat table and vitals obtained again. BP improves to 124/47 and her dizziness gradually improves. PCP contacted who was comfortable with her seated BP readings for her to return home and follow-up with cardiology. Pt is currently unsafe to attempt transfers or ambulation. PT evaluation will be held at this time until after she sees cardiologist and hopefully BP is stabilized or plan of care is established. Pt and son-in-law are in full agreement.                         Patient will benefit from skilled therapeutic intervention in order to improve the following deficits and impairments:     Visit Diagnosis: Muscle weakness (generalized)  Problem List Patient Active Problem List   Diagnosis Date Noted  . Hemiparesis affecting right side as late effect of cerebrovascular accident (HCC) 09/14/2017  . Extension of stroke (HCC) 08/06/2017  . Inadequate dietary intake 08/06/2017  . Hypoalbuminemia due to protein-calorie malnutrition (HCC)   . Orthostatic hypotension   . Poorly controlled type 2 diabetes mellitus with peripheral neuropathy (HCC)   . Emotional lability   . Thrombotic stroke (HCC) 07/13/2017  . Diabetes mellitus type 2 in obese (HCC)   . History of CVA (cerebrovascular  accident)   . Benign essential HTN   . Dysphagia, post-stroke   . Acute blood loss anemia   . Morbid obesity (HCC)   . Right hemiparesis (HCC)   . Cryptogenic stroke (HCC) 07/08/2017  . Chest pain 11/06/2016  . Mid (multi infarct dementia), without behavioral disturbance (HCC) 04/10/2016  . Aphasia as late effect of stroke 04/10/2016  . Encephalopathy   . Cerebral thrombosis with cerebral infarction 12/29/2015  . Hyperglycemia   . Hyperlipidemia   . Hypertensive emergency   . Deep venous thrombosis (HCC)   . AKI (acute kidney injury) (HCC) 12/26/2015  . Altered mental status   . Stroke-like symptoms   . Acute encephalopathy 08/17/2015  . Anxiety and depression   . CVA (cerebrovascular accident due to intracerebral hemorrhage) (HCC) 08/15/2015  . Stroke (HCC)   . Encephalopathy, metabolic 12/28/2014  . 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 Essential hypertension 12/28/2014  . New onset headache 11/02/2014   Lynnea Maizes PT, DPT, GCS  Kalie Cabral 03/21/2018, 10:42 PM  [facility] MAIN [facility] SERVICES [address] [place], [place], [address] Phone: [phone]   Fax:  [phone]  Name: Kelly Romero MRN: [id number] Date of Birth: 09/07/1965

## 2018-03-22 ENCOUNTER — Telehealth: Payer: Self-pay | Admitting: Cardiovascular Disease

## 2018-03-22 NOTE — Telephone Encounter (Signed)
New message   Patient would like to be released from Dr. Allyson Sabal and now see Dr. Bing Matter. Please advise.

## 2018-03-22 NOTE — Telephone Encounter (Signed)
OK with me for her to see Dr Bing Matter

## 2018-03-23 NOTE — Telephone Encounter (Signed)
Fine with me

## 2018-03-23 NOTE — Telephone Encounter (Signed)
Will forward to Dr Bing Matter.

## 2018-03-24 ENCOUNTER — Encounter: Payer: Self-pay | Admitting: Physical Medicine & Rehabilitation

## 2018-03-24 ENCOUNTER — Ambulatory Visit: Payer: Medicaid Other | Admitting: Physical Medicine & Rehabilitation

## 2018-03-24 ENCOUNTER — Encounter: Payer: Medicaid Other | Attending: Physical Medicine & Rehabilitation

## 2018-03-24 VITALS — BP 91/62 | HR 66 | Ht 65.0 in | Wt 175.0 lb

## 2018-03-24 DIAGNOSIS — I69351 Hemiplegia and hemiparesis following cerebral infarction affecting right dominant side: Secondary | ICD-10-CM | POA: Diagnosis not present

## 2018-03-24 DIAGNOSIS — I951 Orthostatic hypotension: Secondary | ICD-10-CM | POA: Insufficient documentation

## 2018-03-24 DIAGNOSIS — F0150 Vascular dementia without behavioral disturbance: Secondary | ICD-10-CM | POA: Diagnosis not present

## 2018-03-24 DIAGNOSIS — E785 Hyperlipidemia, unspecified: Secondary | ICD-10-CM | POA: Insufficient documentation

## 2018-03-24 DIAGNOSIS — I69391 Dysphagia following cerebral infarction: Secondary | ICD-10-CM | POA: Insufficient documentation

## 2018-03-24 DIAGNOSIS — I69320 Aphasia following cerebral infarction: Secondary | ICD-10-CM

## 2018-03-24 DIAGNOSIS — I69359 Hemiplegia and hemiparesis following cerebral infarction affecting unspecified side: Secondary | ICD-10-CM | POA: Diagnosis not present

## 2018-03-24 DIAGNOSIS — I10 Essential (primary) hypertension: Secondary | ICD-10-CM | POA: Diagnosis not present

## 2018-03-24 DIAGNOSIS — E119 Type 2 diabetes mellitus without complications: Secondary | ICD-10-CM | POA: Diagnosis not present

## 2018-03-24 NOTE — Patient Instructions (Signed)
Use TED hose whenever you leave the house or you are up for  A more extended period of time  Use Gtube as you are, it may need to be changed if there is a leak

## 2018-03-24 NOTE — Progress Notes (Signed)
Subjective:    Patient ID: Kelly Romero, female    DOB: September 12, 1965, 53 y.o.   MRN: [id number] 53 year old female with history of HTN, T2DM, depression, HA, multiple CVAs most recent at [facility] on 04/2017 and [facility] with discharge on 07/10/17 after L- CVA. She was readmitted on 07/11/17 with worsening of slurred speech and right sided weakness. MRI brain showed few new strokes in parasagittal cortical and subcortical left frontal lobe. Work up was negative for vasculitis, hypercoagulopathy and Dr. Pearlean Brownie felt tha stroke due to advanced intracranial atherosclerosis. Loop recorder placed by Dr. Johney Frame Admit date: 07/13/2017 Discharge date: 08/06/2017  HPI Still having Orthostatic hypotension problems Not consistently using TEDs Pt looking for a new cardiologist , according to son blood pressures have been as high as 190 systolic or as long as 70 systolic.  Her pressure goes down when she is up for prolonged periods of time standing tolerance is still very short about a minute or so. Patient still on lisinopril and Lopressor.  Oral intake is variable she still gets tube feedings about every 2 to 3 days.  She also gets fluid every 2 to 3 days. Pain Inventory Average Pain 0 Pain Right Now 0 My pain is na  In the last 24 hours, has pain interfered with the following? General activity 0 Relation with others 0 Enjoyment of life 0 What TIME of day is your pain at its worst? na Sleep (in general) Fair  Pain is worse with: na Pain improves with: na Relief from Meds: na  Mobility ability to climb steps?  no do you drive?  no use a wheelchair needs help with transfers  Function disabled: date disabled . I need assistance with the following:  dressing, bathing and toileting  Neuro/Psych weakness trouble walking dizziness depression  Prior Studies Any changes since last visit?  no  Physicians involved in your care Any changes since last visit?  no   Family History  Problem Relation  Age of Onset  . Stroke Father        52s  . Heart attack Father 51  . COPD Mother   . Heart failure Brother   . Cancer Maternal Grandmother        unknown   . COPD Unknown   . Heart failure Maternal Grandfather   . Hypertension Maternal Grandfather   . Cancer Paternal Grandfather        lung  . Diabetes Paternal Grandmother    Social History   Socioeconomic History  . Marital status: Married    Spouse name: Not on file  . Number of children: Not on file  . Years of education: Not on file  . Highest education level: Not on file  Occupational History  . Occupation: Data processing manager  Social Needs  . Financial resource strain: Not on file  . Food insecurity:    Worry: Not on file    Inability: Not on file  . Transportation needs:    Medical: Not on file    Non-medical: Not on file  Tobacco Use  . Smoking status: Never Smoker  . Smokeless tobacco: Never Used  Substance and Sexual Activity  . Alcohol use: No    Alcohol/week: 0.0 standard drinks    Comment: rare   . Drug use: No  . Sexual activity: Not Currently  Lifestyle  . Physical activity:    Days per week: Not on file    Minutes per session: Not on file  . Stress:  Not on file  Relationships  . Social connections:    Talks on phone: Not on file    Gets together: Not on file    Attends religious service: Not on file    Active member of club or organization: Not on file    Attends meetings of clubs or organizations: Not on file    Relationship status: Not on file  Other Topics Concern  . Not on file  Social History Narrative  . Not on file   Past Surgical History:  Procedure Laterality Date  . APPENDECTOMY    . CHOLECYSTECTOMY    . FOOT GANGLION EXCISION    . HERNIA REPAIR    . IR GASTROSTOMY TUBE MOD SED  07/30/2017  . IR GENERIC HISTORICAL  12/30/2015   IR ANGIO INTRA EXTRACRAN SEL COM CAROTID INNOMINATE BILAT MOD SED 12/30/2015 Julieanne Cotton, MD [facility]  . IR GENERIC HISTORICAL  12/30/2015    IR ANGIO VERTEBRAL SEL VERTEBRAL BILAT MOD SED 12/30/2015 Julieanne Cotton, MD [facility]  . LOOP RECORDER INSERTION N/A 07/13/2017   Procedure: LOOP RECORDER INSERTION;  Surgeon: Hillis Range, MD;  [facility] INVASIVE CV LAB;  Service: Cardiovascular;  Laterality: N/A;  . TEE WITHOUT CARDIOVERSION N/A 04/19/2015   Procedure: TRANSESOPHAGEAL ECHOCARDIOGRAM (TEE);  Surgeon: Jake Bathe, MD;  Location: [facility] ENDOSCOPY;  Service: Cardiovascular;  Laterality: N/A;  . VAGINAL HYSTERECTOMY     Past Medical History:  Diagnosis Date  . Diabetes mellitus without complication (HCC)   . Headache   . Hyperlipidemia   . Hypertension   . Stroke (HCC)   . TIA (transient ischemic attack)    BP 91/62   Pulse 66   Ht 5\' 5"  (1.651 m)   Wt 175 lb (79.4 kg)   SpO2 99%   BMI 29.12 kg/m   Opioid Risk Score:   Fall Risk Score:  `1  Depression screen PHQ 2/9  Depression screen PHQ 2/9 04/10/2016  Decreased Interest 2  Down, Depressed, Hopeless 2  PHQ - 2 Score 4  Altered sleeping 3  Tired, decreased energy 3  Change in appetite 0  Feeling bad or failure about yourself  1  Trouble concentrating 3  Moving slowly or fidgety/restless 3  Suicidal thoughts 1  PHQ-9 Score 18  Difficult doing work/chores Extremely dIfficult     Review of Systems  Constitutional: Negative.   HENT: Negative.   Eyes: Negative.   Respiratory: Negative.   Cardiovascular: Negative.        Abnormal blood pressures reported  Gastrointestinal: Negative.   Endocrine: Negative.   Genitourinary: Negative.   Musculoskeletal: Positive for gait problem.  Allergic/Immunologic: Negative.   Neurological: Positive for dizziness and weakness.  Hematological: Negative.   Psychiatric/Behavioral: Positive for dysphoric mood.  All other systems reviewed and are negative.      Objective:   Physical Exam Vitals signs and nursing note reviewed.  Constitutional:      Appearance: She is obese.  HENT:     Head:  Normocephalic.     Nose: Nose normal.     Mouth/Throat:     Mouth: Mucous membranes are moist.  Eyes:     General: No scleral icterus.       Right eye: No discharge.        Left eye: No discharge.     Extraocular Movements: Extraocular movements intact.  Pulmonary:     Effort: Pulmonary effort is normal. No respiratory distress.     Breath sounds: No stridor.  Abdominal:     General: There is no distension.     Palpations: Abdomen is soft.     Comments: G-tube in left upper quadrant  Neurological:     Mental Status: She is alert.   Motor strength is 4/5 in the right deltoid bicep tricep grip hip flexor knee extensor ankle dorsiflexor 5/5 in left deltoid bicep tricep grip hip flexor knee extensor ankle dorsiflexor         Assessment & Plan:  #1.  History of bilateral CVAs with residual cognitive deficits, mild right hemiparesis, dysphagia Patient has not been able to tolerate outpatient therapy secondary to her orthostatic hypotension.  Patient still requiring G-tube due to intermittently poor appetite and poor intake of fluids.  We discussed that this problem is likely multifactorial not only stroke related but also related to diabetic autonomic neuropathy and decreased cognitive awareness of need for nutrition  2.  Orthostatic hypotension history of diabetes with autonomic neuropathy.  She may have got diabetic gastropathy that may be causing some decreased gastric emptying and therefore reducing food intake  I recommended using the TED hose whenever she goes outside the home or if she is up for more than a couple hours at home. She will follow-up with primary MD as well as outpatient evaluation with another cardiologist in the [place] area.  We discussed that she will need a mildly elevated blood pressure to compensate for her orthostatic hypotension.    Physical medicine rehab follow-up in 3 months

## 2018-03-28 ENCOUNTER — Ambulatory Visit: Payer: Medicaid Other

## 2018-03-28 ENCOUNTER — Encounter: Payer: Medicaid Other | Admitting: Occupational Therapy

## 2018-03-28 ENCOUNTER — Ambulatory Visit: Payer: Medicaid Other | Admitting: Speech Pathology

## 2018-03-31 ENCOUNTER — Ambulatory Visit (INDEPENDENT_AMBULATORY_CARE_PROVIDER_SITE_OTHER): Payer: Medicaid Other

## 2018-03-31 DIAGNOSIS — I639 Cerebral infarction, unspecified: Secondary | ICD-10-CM

## 2018-04-01 NOTE — Progress Notes (Signed)
Carelink Summary Report / Loop Recorder 

## 2018-04-02 LAB — CUP PACEART REMOTE DEVICE CHECK
Date Time Interrogation Session: 20200116233927
Implantable Pulse Generator Implant Date: 20190430

## 2018-04-03 LAB — CUP PACEART REMOTE DEVICE CHECK
Implantable Pulse Generator Implant Date: 20190430
MDC IDC SESS DTM: 20191214230640

## 2018-04-07 ENCOUNTER — Ambulatory Visit: Payer: Medicaid Other

## 2018-04-07 ENCOUNTER — Encounter: Payer: Medicaid Other | Admitting: Occupational Therapy

## 2018-04-07 ENCOUNTER — Encounter: Payer: Medicaid Other | Admitting: Speech Pathology

## 2018-04-11 ENCOUNTER — Encounter: Payer: Medicaid Other | Admitting: Occupational Therapy

## 2018-04-11 ENCOUNTER — Encounter: Payer: Medicaid Other | Admitting: Speech Pathology

## 2018-04-11 ENCOUNTER — Ambulatory Visit: Payer: Medicaid Other

## 2018-04-18 ENCOUNTER — Ambulatory Visit: Payer: Medicaid Other

## 2018-04-18 ENCOUNTER — Encounter: Payer: Medicaid Other | Admitting: Occupational Therapy

## 2018-04-18 ENCOUNTER — Encounter: Payer: Medicaid Other | Admitting: Speech Pathology

## 2018-04-19 ENCOUNTER — Encounter: Payer: Self-pay | Admitting: Cardiology

## 2018-04-19 ENCOUNTER — Ambulatory Visit (INDEPENDENT_AMBULATORY_CARE_PROVIDER_SITE_OTHER): Payer: Medicaid Other | Admitting: Cardiology

## 2018-04-19 VITALS — BP 72/46 | HR 73 | Ht 65.0 in | Wt 175.0 lb

## 2018-04-19 DIAGNOSIS — I6930 Unspecified sequelae of cerebral infarction: Secondary | ICD-10-CM | POA: Diagnosis not present

## 2018-04-19 DIAGNOSIS — G8191 Hemiplegia, unspecified affecting right dominant side: Secondary | ICD-10-CM

## 2018-04-19 DIAGNOSIS — F419 Anxiety disorder, unspecified: Secondary | ICD-10-CM

## 2018-04-19 DIAGNOSIS — E7800 Pure hypercholesterolemia, unspecified: Secondary | ICD-10-CM

## 2018-04-19 DIAGNOSIS — I951 Orthostatic hypotension: Secondary | ICD-10-CM | POA: Diagnosis not present

## 2018-04-19 DIAGNOSIS — F329 Major depressive disorder, single episode, unspecified: Secondary | ICD-10-CM

## 2018-04-19 MED ORDER — LISINOPRIL 10 MG PO TABS: 5.0000 mg | ORAL_TABLET | Freq: Every day | ORAL | 0 refills | Status: DC

## 2018-04-19 NOTE — Patient Instructions (Signed)
Medication Instructions:  Your physician has recommended you make the following change in your medication:   Decrease: Lisinopril to only 5 mg daily in the evening If you need a refill on your cardiac medications before your next appointment, please call your pharmacy.   Lab work: None.  If you have labs (blood work) drawn today and your tests are completely normal, you will receive your results only by: Marland Kitchen. MyChart Message (if you have MyChart) OR . A paper copy in the mail If you have any lab test that is abnormal or we need to change your treatment, we will call you to review the results.  Testing/Procedures: None.   Follow-Up: At [facility], you and your health needs are our priority.  As part of our continuing mission to provide you with exceptional heart care, we have created designated Provider Care Teams.  These Care Teams include your primary Cardiologist (physician) and Advanced Practice Providers (APPs -  Physician Assistants and Nurse Practitioners) who all work together to provide you with the care you need, when you need it. You will need a follow up appointment in 3 weeks.  Please call our office 2 months in advance to schedule this appointment.  You may see Nanetta BattyJonathan [name], MD or another member of our [facility] Provider Team in [place]: Norman HerrlichBrian Munley, MD . Belva Cromeajan Revankar, MD  Any Other Special Instructions Will Be Listed Below (If Applicable).

## 2018-04-19 NOTE — Progress Notes (Signed)
Cardiology Office Note:    Date:  04/19/2018   ID:  Kelly Romero, DOB 02/15/1966, MRN [id number]  PCP:  Noni Saupeedding, [name], MD  Cardiologist:  Gypsy Balsamobert , MD    Referring MD: Noni Saupeedding, [name], MD   Chief Complaint  Patient presents with  . Hyperlipidemia  . Blood pressure bottoming out    Trying to get PT, but blood pressure isd to low.  I have a problem blood pressure  History of Present Illness:    Kelly Khanamela R Norton is a 53 y.o. female with quite devastating past medical history she had 5 strokes which left her practically disabled.  He comes to my office with her son-in-law Maisie Fushomas who seems to be very caring and very concerned about her situation.  From what I can get the biggest problem is the fact that her blood pressure drops when she gets up she does have significant orthostatic hypotension which prevented her from doing anything.  She stay in the bed all the time.  When she gets up very quickly she wants to lay down because blood pressure falls down and she feels very bad.  She does have electric recliner and she usually spends her day being there.  She was tolerated the change to drink plenty of fluids she was told also that she can liberate fluid intake her son-in-law cut down her lisinopril which make her feel slightly better but still problem with low blood pressure with orthostasis is significant and clearly affect her quality of life.  In terms of her CVA she did have extensive work-up done trying to identify the reason for it.  So far it is unrevealing.  She does have implantable loop recorder that being checked on a regular basis.  So far no evidence of atrial fibrillation.  I suspect the reason for her recurrent CVAs the fact that she does have quite advanced atherosclerosis which is caused by years of not taking care of herself.  Past Medical History:  Diagnosis Date  . Diabetes mellitus without complication (HCC)   . Headache   . Hyperlipidemia   .  Hypertension   . Stroke (HCC)   . TIA (transient ischemic attack)     Past Surgical History:  Procedure Laterality Date  . APPENDECTOMY    . CHOLECYSTECTOMY    . FOOT GANGLION EXCISION    . HERNIA REPAIR    . IR GASTROSTOMY TUBE MOD SED  07/30/2017  . IR GENERIC HISTORICAL  12/30/2015   IR ANGIO INTRA EXTRACRAN SEL COM CAROTID INNOMINATE BILAT MOD SED 12/30/2015 Julieanne CottonSanjeev Deveshwar, MD [facility]INTER[facility]  . IR GENERIC HISTORICAL  12/30/2015   IR ANGIO VERTEBRAL SEL VERTEBRAL BILAT MOD SED 12/30/2015 Julieanne CottonSanjeev Deveshwar, MD [facility]  . LOOP RECORDER INSERTION N/A 07/13/2017   Procedure: LOOP RECORDER INSERTION;  Surgeon: Hillis RangeAllred, James, MD;  [facility];  Service: Cardiovascular;  Laterality: N/A;  . TEE WITHOUT CARDIOVERSION N/A 04/19/2015   Procedure: TRANSESOPHAGEAL ECHOCARDIOGRAM (TEE);  Surgeon: Jake BatheMark C Skains, MD;  [facility];  Service: Cardiovascular;  Laterality: N/A;  . VAGINAL HYSTERECTOMY      Current Medications: Current Meds  Medication Sig  . clopidogrel (PLAVIX) 75 MG tablet Take 1 tablet (75 mg total) by mouth daily.  . divalproex (DEPAKOTE) 250 MG DR tablet Take 1 tablet (250 mg total) by mouth 2 (two) times daily.  Marland Kitchen. escitalopram (LEXAPRO) 10 MG tablet Take 1 tablet (10 mg total) by mouth daily.  . insulin  detemir (LEVEMIR) 100 UNIT/ML injection Inject 0.2 mLs (20 Units total) into the skin daily before breakfast.  . lisinopril (PRINIVIL,ZESTRIL) 10 MG tablet Take 1 tablet (10 mg total) by mouth 2 (two) times daily.  . metoprolol tartrate (LOPRESSOR) 25 MG tablet Take 0.5 tablets (12.5 mg total) by mouth 2 (two) times daily.  . polyethylene glycol (MIRALAX / GLYCOLAX) packet May take 17 g with 8 oz water per peg tube daily prn  . rosuvastatin (CRESTOR) 40 MG tablet Take 1 tablet (40 mg total) by mouth at bedtime.     Allergies:   Erythromycin and Latex   Social History   Socioeconomic History  . Marital status: Married    Spouse name: Not  on file  . Number of children: Not on file  . Years of education: Not on file  . Highest education level: Not on file  Occupational History  . Occupation: Data processing manager  Social Needs  . Financial resource strain: Not on file  . Food insecurity:    Worry: Not on file    Inability: Not on file  . Transportation needs:    Medical: Not on file    Non-medical: Not on file  Tobacco Use  . Smoking status: Never Smoker  . Smokeless tobacco: Never Used  Substance and Sexual Activity  . Alcohol use: No    Alcohol/week: 0.0 standard drinks    Comment: rare   . Drug use: No  . Sexual activity: Not Currently  Lifestyle  . Physical activity:    Days per week: Not on file    Minutes per session: Not on file  . Stress: Not on file  Relationships  . Social connections:    Talks on phone: Not on file    Gets together: Not on file    Attends religious service: Not on file    Active member of club or organization: Not on file    Attends meetings of clubs or organizations: Not on file    Relationship status: Not on file  Other Topics Concern  . Not on file  Social History Narrative  . Not on file     Family History: [name] family history includes COPD in her mother and unknown relative; Cancer in her maternal grandmother and paternal grandfather; Diabetes in her paternal grandmother; Heart attack (age of onset: 22) in her father; Heart failure in her brother and maternal grandfather; Hypertension in her maternal grandfather; Stroke in her father. ROS:   Please see the history of present illness.    All 14 point review of systems negative except as described per history of present illness  EKGs/Labs/Other Studies Reviewed:      Recent Labs: 01/07/2018: ALT 8; BUN 23; Creatinine, Ser 1.16; Hemoglobin 11.3; Platelets 229.0; Potassium 3.5; Sodium 140  Recent Lipid Panel    Component Value Date/Time   CHOL 125 07/09/2017 0633   CHOL 329 (H) 11/06/2016 1040   TRIG 99  07/09/2017 0633   HDL 33 (L) 07/09/2017 0633   HDL 50 11/06/2016 1040   CHOLHDL 3.8 07/09/2017 0633   VLDL 20 07/09/2017 0633   LDLCALC 72 07/09/2017 0633   LDLCALC 229 (H) 11/06/2016 1040    Physical Exam:    VS:  BP (!) 74/46   Pulse 73   Ht 5\' 5"  (1.651 m)   Wt 175 lb (79.4 kg)   SpO2 98%   BMI 29.12 kg/m     Wt Readings from Last 3 Encounters:  04/19/18 175 lb (  79.4 kg)  03/24/18 175 lb (79.4 kg)  01/21/18 187 lb (84.8 kg)     GEN:  Well nourished, well developed in no acute distress HEENT: Normal NECK: No JVD; No carotid bruits LYMPHATICS: No lymphadenopathy CARDIAC: RRR, no murmurs, no rubs, no gallops RESPIRATORY:  Clear to auscultation without rales, wheezing or rhonchi  ABDOMEN: Soft, non-tender, non-distended MUSCULOSKELETAL:  No edema; No deformity  SKIN: Warm and dry LOWER EXTREMITIES: no swelling NEUROLOGIC:  Alert and oriented x 3 PSYCHIATRIC:  Normal affect   ASSESSMENT:    1. Orthostatic hypotension   2. Late effect of cerebrovascular accident (CVA)   3. Right hemiparesis (HCC)   4. Anxiety and depression   5. Pure hypercholesterolemia    PLAN:    In order of problems listed above:  1. Orthostatic hypotension debilitating.  I asked them to the following #1 she will drink plenty of fluids and I make sure that her son-in-law understand this and he does on top of that asking to add some salt to her food.  I asked her to be in the recliner all day and maybe twice an hour to raise her head and see how she will be able to tolerate this.  She knows about elastic stockings but does not wear it because it is difficult for her to put it on.  I also asked her to cut down her lisinopril to5 mg at evening time.  In the future we may consider using midodrine or maybe even Northera.  Today her blood pressure is very low however her son-in-law said that this is normal for her when she lay flat her blood pressure is good.  I suspect etiology of significant orthostatic  hypotension is history of CVA on top of that if her diabetes is not well controlled that of course will forced diuresis which make her dehydrated and promote orthostatic hypotension 2. Late effect of CVA.  Noted likely no new issues recently. 3. Anxiety depression clearly need to be addressed as well.  She may required some psychological support.  This is something we can elaborate more in the future. 4. Dyslipidemia on medications which I will continue.   Medication Adjustments/Labs and Tests Ordered: Current medicines are reviewed at length with the patient today.  Concerns regarding medicines are outlined above.  No orders of the defined types were placed in this encounter.  Medication changes: No orders of the defined types were placed in this encounter.   Signed, Georgeanna Lea, MD, [facility] 04/19/2018 12:05 PM    [facility]

## 2018-04-21 ENCOUNTER — Encounter: Payer: Medicaid Other | Admitting: Occupational Therapy

## 2018-04-21 ENCOUNTER — Ambulatory Visit: Payer: Medicaid Other

## 2018-04-21 ENCOUNTER — Encounter: Payer: Medicaid Other | Admitting: Speech Pathology

## 2018-05-03 ENCOUNTER — Ambulatory Visit (INDEPENDENT_AMBULATORY_CARE_PROVIDER_SITE_OTHER): Payer: Medicaid Other

## 2018-05-03 DIAGNOSIS — I639 Cerebral infarction, unspecified: Secondary | ICD-10-CM | POA: Diagnosis not present

## 2018-05-04 LAB — CUP PACEART REMOTE DEVICE CHECK
Date Time Interrogation Session: 20200218233911
Implantable Pulse Generator Implant Date: 20190430

## 2018-05-10 ENCOUNTER — Ambulatory Visit (INDEPENDENT_AMBULATORY_CARE_PROVIDER_SITE_OTHER): Payer: Medicaid Other | Admitting: Cardiology

## 2018-05-10 ENCOUNTER — Encounter: Payer: Self-pay | Admitting: Cardiology

## 2018-05-10 VITALS — BP 100/60 | HR 69

## 2018-05-10 DIAGNOSIS — I10 Essential (primary) hypertension: Secondary | ICD-10-CM

## 2018-05-10 DIAGNOSIS — I69351 Hemiplegia and hemiparesis following cerebral infarction affecting right dominant side: Secondary | ICD-10-CM | POA: Diagnosis not present

## 2018-05-10 DIAGNOSIS — I951 Orthostatic hypotension: Secondary | ICD-10-CM

## 2018-05-10 NOTE — Progress Notes (Signed)
Cardiology Office Note:    Date:  05/10/2018   ID:  Kelly Romero, DOB 02-Apr-1965, MRN [id number]  PCP:  Noni Saupe, MD  Cardiologist:  Gypsy Balsam, MD    Referring MD: Noni Saupe, MD   Chief Complaint  Patient presents with  . 3 week follow up  Doing slightly better  History of Present Illness:    Kelly Romero is a 54 y.o. female with multiple CVAs, the biggest clinical problem right now Dr. facing is orthostatic hypotension she cannot function.  I spent a last visit on explaining to her how important is to take plenty of fluids and she gradually doing that also she is putting elastic stockings on her legs that helps another problem is the fact that she basically spends all her time in the bed even if she is in a recliner she is usually flat I told her this is something we need to change I told her when she is in the recliner she need to get her hand up every few minutes for few minutes and gradually extended.  That she is upright she need to retrain her body to the fact that sometimes she is in upright position.  Last time I reduce the dose of her lisinopril and her blood pressure appears to be better controlled right now.  In the future we may be forced to use some Midodrin, however for now I would prefer to have nonpharmacological measures to help with her blood pressure.  In the future we may be forced to stop her metoprolol as well.  She used to go to rehab however now she cannot go there because every time she goes to her blood pressure will be very low solution for this will be to give her dose of Midodrin half an hour before she goes for rehab and then hopefully should be able to comply with rehabilitation which will be beneficial to her.  I will see her back in my office in about 6 weeks and see how she does.  Overall there is some improvement but still a long way to go I warned him that this is a long process cannot take some time but eventually will be  able to get there.  Past Medical History:  Diagnosis Date  . Diabetes mellitus without complication (HCC)   . Headache   . Hyperlipidemia   . Hypertension   . Stroke (HCC)   . TIA (transient ischemic attack)     Past Surgical History:  Procedure Laterality Date  . APPENDECTOMY    . CHOLECYSTECTOMY    . FOOT GANGLION EXCISION    . HERNIA REPAIR    . IR GASTROSTOMY TUBE MOD SED  07/30/2017  . IR GENERIC HISTORICAL  12/30/2015   IR ANGIO INTRA EXTRACRAN SEL COM CAROTID INNOMINATE BILAT MOD SED 12/30/2015 Julieanne Cotton, MD [facility]  . IR GENERIC HISTORICAL  12/30/2015   IR ANGIO VERTEBRAL SEL VERTEBRAL BILAT MOD SED 12/30/2015 Julieanne Cotton, MD [facility]  . LOOP RECORDER INSERTION N/A 07/13/2017   Procedure: LOOP RECORDER INSERTION;  Surgeon: Hillis Range, MD;  [facility];  Service: Cardiovascular;  Laterality: N/A;  . TEE WITHOUT CARDIOVERSION N/A 04/19/2015   Procedure: TRANSESOPHAGEAL ECHOCARDIOGRAM (TEE);  Surgeon: Jake Bathe, MD;  [facility];  Service: Cardiovascular;  Laterality: N/A;  . VAGINAL HYSTERECTOMY      Current Medications: Current Meds  Medication Sig  . clopidogrel (PLAVIX) 75 MG tablet  Take 1 tablet (75 mg total) by mouth daily.  . divalproex (DEPAKOTE) 250 MG DR tablet Take 1 tablet (250 mg total) by mouth 2 (two) times daily.  Marland Kitchen escitalopram (LEXAPRO) 10 MG tablet Take 1 tablet (10 mg total) by mouth daily.  . insulin detemir (LEVEMIR) 100 UNIT/ML injection Inject 0.2 mLs (20 Units total) into the skin daily before breakfast. (Patient taking differently: Inject 15 Units into the skin daily before breakfast. )  . lisinopril (PRINIVIL,ZESTRIL) 2.5 MG tablet Take 2.5 mg by mouth daily.  . metoprolol tartrate (LOPRESSOR) 25 MG tablet Take 0.5 tablets (12.5 mg total) by mouth 2 (two) times daily.  . polyethylene glycol (MIRALAX / GLYCOLAX) packet May take 17 g with 8 oz water per peg tube daily prn  . rosuvastatin  (CRESTOR) 40 MG tablet Take 1 tablet (40 mg total) by mouth at bedtime.     Allergies:   Erythromycin and Latex   Social History   Socioeconomic History  . Marital status: Married    Spouse name: Not on file  . Number of children: Not on file  . Years of education: Not on file  . Highest education level: Not on file  Occupational History  . Occupation: Data processing manager  Social Needs  . Financial resource strain: Not on file  . Food insecurity:    Worry: Not on file    Inability: Not on file  . Transportation needs:    Medical: Not on file    Non-medical: Not on file  Tobacco Use  . Smoking status: Never Smoker  . Smokeless tobacco: Never Used  Substance and Sexual Activity  . Alcohol use: No    Alcohol/week: 0.0 standard drinks    Comment: rare   . Drug use: No  . Sexual activity: Not Currently  Lifestyle  . Physical activity:    Days per week: Not on file    Minutes per session: Not on file  . Stress: Not on file  Relationships  . Social connections:    Talks on phone: Not on file    Gets together: Not on file    Attends religious service: Not on file    Active member of club or organization: Not on file    Attends meetings of clubs or organizations: Not on file    Relationship status: Not on file  Other Topics Concern  . Not on file  Social History Narrative  . Not on file     Family History: [name] family history includes COPD in her mother and unknown relative; Cancer in her maternal grandmother and paternal grandfather; Diabetes in her paternal grandmother; Heart attack (age of onset: 11) in her father; Heart failure in her brother and maternal grandfather; Hypertension in her maternal grandfather; Stroke in her father. ROS:   Please see the history of present illness.    All 14 point review of systems negative except as described per history of present illness  EKGs/Labs/Other Studies Reviewed:      Recent Labs: 01/07/2018: ALT 8; BUN 23;  Creatinine, Ser 1.16; Hemoglobin 11.3; Platelets 229.0; Potassium 3.5; Sodium 140  Recent Lipid Panel    Component Value Date/Time   CHOL 125 07/09/2017 0633   CHOL 329 (H) 11/06/2016 1040   TRIG 99 07/09/2017 0633   HDL 33 (L) 07/09/2017 0633   HDL 50 11/06/2016 1040   CHOLHDL 3.8 07/09/2017 0633   VLDL 20 07/09/2017 0633   LDLCALC 72 07/09/2017 0633   LDLCALC 229 (H) 11/06/2016  1040    Physical Exam:    VS:  BP 100/60   Pulse 69   SpO2 98%     Wt Readings from Last 3 Encounters:  04/19/18 175 lb (79.4 kg)  03/24/18 175 lb (79.4 kg)  01/21/18 187 lb (84.8 kg)     GEN:  Well nourished, well developed in no acute distress HEENT: Normal NECK: No JVD; No carotid bruits LYMPHATICS: No lymphadenopathy CARDIAC: RRR, no murmurs, no rubs, no gallops RESPIRATORY:  Clear to auscultation without rales, wheezing or rhonchi  ABDOMEN: Soft, non-tender, non-distended MUSCULOSKELETAL:  No edema; No deformity  SKIN: Warm and dry LOWER EXTREMITIES: no swelling NEUROLOGIC:  Alert and oriented x 3 PSYCHIATRIC:  Normal affect   ASSESSMENT:    1. Orthostatic hypotension   2. Essential hypertension   3. Hemiparesis affecting right side as late effect of cerebrovascular accident [facility](HCC)    PLAN:    In order of problems listed above:  1. Orthostatic hypotension discussion as described above. 2. History of multiple CVA stable on appropriate medications which I will continue. 3. Essential hypertension blood pressure appears to be controlled now.   Medication Adjustments/Labs and Tests Ordered: Current medicines are reviewed at length with the patient today.  Concerns regarding medicines are outlined above.  No orders of the defined types were placed in this encounter.  Medication changes: No orders of the defined types were placed in this encounter.   Signed, Georgeanna Leaobert J. Chelcee Korpi, MD, [facility] 05/10/2018 11:16 AM    [facility]

## 2018-05-10 NOTE — Patient Instructions (Addendum)
Medication Instructions:  Your physician recommends that you continue on your current medications as directed. Please refer to the Current Medication list given to you today.  If you need a refill on your cardiac medications before your next appointment, please call your pharmacy.   Lab work: None.  If you have labs (blood work) drawn today and your tests are completely normal, you will receive your results only by: Marland Kitchen MyChart Message (if you have MyChart) OR . A paper copy in the mail If you have any lab test that is abnormal or we need to change your treatment, we will call you to review the results.  Testing/Procedures: None.   Follow-Up: At [facility], you and your health needs are our priority.  As part of our continuing mission to provide you with exceptional heart care, we have created designated Provider Care Teams.  These Care Teams include your primary Cardiologist (physician) and Advanced Practice Providers (APPs -  Physician Assistants and Nurse Practitioners) who all work together to provide you with the care you need, when you need it. You will need a follow up appointment in 6 weeks.  Please call our office 2 months in advance to schedule this appointment.  You may see Nanetta Batty, MD or another member of our [facility] Provider Team in [place]: Norman Herrlich, MD . Belva Crome, MD  Any Other Special Instructions Will Be Listed Below (If Applicable).

## 2018-05-11 NOTE — Progress Notes (Signed)
Carelink Summary Report / Loop Recorder 

## 2018-06-06 ENCOUNTER — Other Ambulatory Visit: Payer: Self-pay

## 2018-06-06 ENCOUNTER — Ambulatory Visit (INDEPENDENT_AMBULATORY_CARE_PROVIDER_SITE_OTHER): Payer: Medicaid Other | Admitting: *Deleted

## 2018-06-06 DIAGNOSIS — I639 Cerebral infarction, unspecified: Secondary | ICD-10-CM | POA: Diagnosis not present

## 2018-06-07 LAB — CUP PACEART REMOTE DEVICE CHECK
Date Time Interrogation Session: 20200322234308
Implantable Pulse Generator Implant Date: 20190430

## 2018-06-14 ENCOUNTER — Telehealth: Payer: Self-pay | Admitting: Cardiology

## 2018-06-14 NOTE — Telephone Encounter (Signed)
Called patient to check to see of they would agree to do televisit.

## 2018-06-15 NOTE — Progress Notes (Signed)
Carelink Summary Report / Loop Recorder 

## 2018-06-16 ENCOUNTER — Telehealth: Payer: Self-pay | Admitting: Emergency Medicine

## 2018-06-16 NOTE — Telephone Encounter (Signed)
Patient confirms per son in law per patient's permission to switch to a televisit. Consent will be sent to mychart.

## 2018-06-21 ENCOUNTER — Telehealth (INDEPENDENT_AMBULATORY_CARE_PROVIDER_SITE_OTHER): Payer: Medicare Other | Admitting: Cardiology

## 2018-06-21 ENCOUNTER — Encounter: Payer: Self-pay | Admitting: Cardiology

## 2018-06-21 ENCOUNTER — Other Ambulatory Visit: Payer: Self-pay

## 2018-06-21 VITALS — BP 171/93 | HR 77

## 2018-06-21 DIAGNOSIS — I951 Orthostatic hypotension: Secondary | ICD-10-CM

## 2018-06-21 DIAGNOSIS — F0150 Vascular dementia without behavioral disturbance: Secondary | ICD-10-CM

## 2018-06-21 DIAGNOSIS — I10 Essential (primary) hypertension: Secondary | ICD-10-CM

## 2018-06-21 NOTE — Patient Instructions (Signed)
Medication Instructions:  Your physician recommends that you continue on your current medications as directed. Please refer to the Current Medication list given to you today.  If you need a refill on your cardiac medications before your next appointment, please call your pharmacy.   Lab work: None If you have labs (blood work) drawn today and your tests are completely normal, you will receive your results only by: Marland Kitchen MyChart Message (if you have MyChart) OR . A paper copy in the mail If you have any lab test that is abnormal or we need to change your treatment, we will call you to review the results.  Testing/Procedures: None  Follow-Up: At [facility], you and your health needs are our priority.  As part of our continuing mission to provide you with exceptional heart care, we have created designated Provider Care Teams.  These Care Teams include your primary Cardiologist (physician) and Advanced Practice Providers (APPs -  Physician Assistants and Nurse Practitioners) who all work together to provide you with the care you need, when you need it. You will need a follow up appointment in 2 months.  Please call our office 2 months in advance to schedule this appointment.  You may see Nanetta Batty, MD or another member of our [facility] Provider Team in [place]: Norman Herrlich, MD . Belva Crome, MD  Any Other Special Instructions Will Be Listed Below (If Applicable).

## 2018-06-21 NOTE — Progress Notes (Signed)
Virtual Visit via Video Note   This visit type was conducted due to national recommendations for restrictions regarding the COVID-19 Pandemic (e.g. social distancing) in an effort to limit this patient's exposure and mitigate transmission in our community.  Due to her co-morbid illnesses, this patient is at least at moderate risk for complications without adequate follow up.  This format is felt to be most appropriate for this patient at this time.  All issues noted in this document were discussed and addressed.  A limited physical exam was performed with this format.  Please refer to the patient's chart for her consent to telehealth for [facility].  Evaluation Performed:  Follow-up visit  This visit type was conducted due to national recommendations for restrictions regarding the COVID-19 Pandemic (e.g. social distancing).  This format is felt to be most appropriate for this patient at this time.  All issues noted in this document were discussed and addressed.  No physical exam was performed (except for noted visual exam findings with Video Visits).  Please refer to the patient's chart (MyChart message for video visits and phone note for telephone visits) for the patient's consent to telehealth for [facility].  Date:  06/21/2018  ID: [name], DOB [birth year], MRN [id number]   Patient Location:  [address] [place] [address]   Provider location:   [facility] [place] [facility]  PCP:  Kelly Saupe, MD  Cardiologist:  Kelly Balsam, MD     Chief Complaint: Still same  History of Present Illness:    Kelly Romero is a 53 y.o. female  who presents via audio/video conferencing for a telehealth visit today.  With multiple strokes.  She also does have history of hypertension and orthostatic hypotension leading problem appears to be orthostatic hypotension that make her practically completely disabled.  She spent days laying in bed.  We have multiple visits  already try to address this problem she does wear elastic stockings she try to drink a little more fluid she spent some time in the recliner her son-in-law trying to encourage her to do this but he admits to me that she is not putting enough effort.  She denies having chest pain tightness squeezing pressure burning chest.  I see her on a video link and she is in her bed with pillows around her.   The patient does not have symptoms concerning for COVID-19 infection (fever, chills, cough, or new SHORTNESS OF BREATH).    Prior CV studies:   The following studies were reviewed today:       Past Medical History:  Diagnosis Date   Diabetes mellitus without complication (HCC)    Headache    Hyperlipidemia    Hypertension    Stroke (HCC)    TIA (transient ischemic attack)     Past Surgical History:  Procedure Laterality Date   APPENDECTOMY     CHOLECYSTECTOMY     FOOT GANGLION EXCISION     HERNIA REPAIR     IR GASTROSTOMY TUBE MOD SED  07/30/2017   IR GENERIC HISTORICAL  12/30/2015   IR ANGIO INTRA EXTRACRAN SEL COM CAROTID INNOMINATE BILAT MOD SED 12/30/2015 Julieanne Cotton, MD [facility]-INTERV RAD   IR GENERIC HISTORICAL  12/30/2015   IR ANGIO VERTEBRAL SEL VERTEBRAL BILAT MOD SED 12/30/2015 Julieanne Cotton, MD [facility]   LOOP RECORDER INSERTION N/A 07/13/2017   Procedure: LOOP RECORDER INSERTION;  Surgeon: Hillis Range, MD;  [facility]: [facility] INVAS[facility] CV LAB;  Service:  Cardiovascular;  Laterality: N/A;   TEE WITHOUT CARDIOVERSION N/A 04/19/2015   Procedure: TRANSESOPHAGEAL ECHOCARDIOGRAM (TEE);  Surgeon: Jake BatheMark C Skains, MD;  [facility];  Service: Cardiovascular;  Laterality: N/A;   VAGINAL HYSTERECTOMY       Current Meds  Medication Sig   clopidogrel (PLAVIX) 75 MG tablet Take 1 tablet (75 mg total) by mouth daily.   divalproex (DEPAKOTE) 250 MG DR tablet Take 1 tablet (250 mg total) by mouth 2 (two) times daily.   escitalopram (LEXAPRO) 10 MG tablet  Take 1 tablet (10 mg total) by mouth daily.   insulin detemir (LEVEMIR) 100 UNIT/ML injection Inject 0.2 mLs (20 Units total) into the skin daily before breakfast. (Patient taking differently: Inject 15 Units into the skin daily before breakfast. )   lisinopril (PRINIVIL,ZESTRIL) 2.5 MG tablet Take 2.5 mg by mouth daily.   metoprolol tartrate (LOPRESSOR) 25 MG tablet Take 0.5 tablets (12.5 mg total) by mouth 2 (two) times daily.   polyethylene glycol (MIRALAX / GLYCOLAX) packet May take 17 g with 8 oz water per peg tube daily prn   rosuvastatin (CRESTOR) 40 MG tablet Take 1 tablet (40 mg total) by mouth at bedtime.      Family History: [name] family history includes COPD in her mother and unknown relative; Cancer in her maternal grandmother and paternal grandfather; Diabetes in her paternal grandmother; Heart attack (age of onset: 7763) in her father; Heart failure in her brother and maternal grandfather; Hypertension in her maternal grandfather; Stroke in her father.   ROS:   Please see the history of present illness.     All other systems reviewed and are negative.   Labs/Other Tests and Data Reviewed:     Recent Labs: 01/07/2018: ALT 8; BUN 23; Creatinine, Ser 1.16; Hemoglobin 11.3; Platelets 229.0; Potassium 3.5; Sodium 140  Recent Lipid Panel    Component Value Date/Time   CHOL 125 07/09/2017 0633   CHOL 329 (H) 11/06/2016 1040   TRIG 99 07/09/2017 0633   HDL 33 (L) 07/09/2017 0633   HDL 50 11/06/2016 1040   CHOLHDL 3.8 07/09/2017 0633   VLDL 20 07/09/2017 0633   LDLCALC 72 07/09/2017 0633   LDLCALC 229 (H) 11/06/2016 1040      Exam:    Vital Signs:  BP (!) 171/93    Pulse 77     Wt Readings from Last 3 Encounters:  04/19/18 175 lb (79.4 kg)  03/24/18 175 lb (79.4 kg)  01/21/18 187 lb (84.8 kg)     Well nourished, well developed female in no acute distress. Alert awake currently x3 quite cheerful actually she is happy to see me on video link there is  no JVD there is no swelling of lower extremities.  I had a conversation with her first and then I talked to her son-in-law.  Diagnosis for this visit:   1. Orthostatic hypotension   2. Essential hypertension   3. Mid (multi infarct dementia), without behavioral disturbance (HCC)      ASSESSMENT & PLAN:    1.  Orthostatic hypotension again we talked about potential use of medication like immediate urine however her supine blood pressure is usually very elevated therefore I want her if this is the medication we decide to use she will have to sit upright for few hours after Midodrin is given.  She said she prefers still to continue present management which is fine with me.  We will get in touch again in about 2 months to  see how she does 2.  Essential hypertension medication be cut down because of orthostatic hypotension. 3.  Multi-infarct dementia.  Unchanged.  COVID-19 Education: The signs and symptoms of COVID-19 were discussed with the patient and how to seek care for testing (follow up with PCP or arrange E-visit).  The importance of social distancing was discussed today.  Patient Risk:   After full review of this patients clinical status, I feel that they are at least moderate risk at this time.  Time:   Today, I have spent 16 minutes with the patient with telehealth technology discussing pt health issues. Visit was finished at 12:05 PM.  I spent 10 minutes reviewing her chart before visit.    Medication Adjustments/Labs and Tests Ordered: Current medicines are reviewed at length with the patient today.  Concerns regarding medicines are outlined above.  No orders of the defined types were placed in this encounter.  Medication changes: No orders of the defined types were placed in this encounter.    Disposition: 2 months follow-up  Signed, Georgeanna Lea, MD, [facility] 06/21/2018 12:02 PM    [facility]

## 2018-06-27 ENCOUNTER — Ambulatory Visit: Payer: Medicare Other | Admitting: Physical Medicine & Rehabilitation

## 2018-06-27 ENCOUNTER — Other Ambulatory Visit: Payer: Self-pay

## 2018-06-27 ENCOUNTER — Encounter: Payer: Medicare Other | Attending: Physical Medicine & Rehabilitation

## 2018-06-27 ENCOUNTER — Ambulatory Visit (HOSPITAL_BASED_OUTPATIENT_CLINIC_OR_DEPARTMENT_OTHER): Payer: Medicare Other | Admitting: Physical Medicine & Rehabilitation

## 2018-06-27 ENCOUNTER — Encounter: Payer: Self-pay | Admitting: Physical Medicine & Rehabilitation

## 2018-06-27 VITALS — BP 89/65 | HR 70

## 2018-06-27 DIAGNOSIS — I69320 Aphasia following cerebral infarction: Secondary | ICD-10-CM

## 2018-06-27 DIAGNOSIS — I69391 Dysphagia following cerebral infarction: Secondary | ICD-10-CM

## 2018-06-27 DIAGNOSIS — I10 Essential (primary) hypertension: Secondary | ICD-10-CM | POA: Insufficient documentation

## 2018-06-27 DIAGNOSIS — I69351 Hemiplegia and hemiparesis following cerebral infarction affecting right dominant side: Secondary | ICD-10-CM | POA: Insufficient documentation

## 2018-06-27 DIAGNOSIS — F0150 Vascular dementia without behavioral disturbance: Secondary | ICD-10-CM | POA: Insufficient documentation

## 2018-06-27 DIAGNOSIS — E785 Hyperlipidemia, unspecified: Secondary | ICD-10-CM | POA: Insufficient documentation

## 2018-06-27 DIAGNOSIS — I639 Cerebral infarction, unspecified: Secondary | ICD-10-CM | POA: Diagnosis not present

## 2018-06-27 DIAGNOSIS — I69359 Hemiplegia and hemiparesis following cerebral infarction affecting unspecified side: Secondary | ICD-10-CM | POA: Diagnosis not present

## 2018-06-27 DIAGNOSIS — E119 Type 2 diabetes mellitus without complications: Secondary | ICD-10-CM | POA: Insufficient documentation

## 2018-06-27 DIAGNOSIS — I951 Orthostatic hypotension: Secondary | ICD-10-CM | POA: Insufficient documentation

## 2018-06-27 NOTE — Progress Notes (Signed)
Subjective:  Covid Complication score 3- moderate  Patient ID: Kelly Romero, female    DOB: 30-May-1965, 53 y.o.   MRN: [id number] The first stroke occurred 12/07/2014 and was admitted to [facility]. She has some agitation at that time. However, CT scan was negative. Urine tox screen was positive for opioids. Patient states she was not taking opioids at that time, however, wonders whether her family may have given her some.  There were elevated blood pressures, hemoglobin A 1 C, was elevated at 12, no fever. TIA was suspected. Had another strokelike symptom 02/22/2015 working up with a headache and slurred speech and went to [facility] regional MRI showed left occipital subacute infarct. Workup including Dopplers, 2-D echo, hypercoagulable panel was all negative. Plavix was added to aspirin TEE performed to 06/03/2015. Normal ejection fracture. No cardiac source of emboli  Admission to [facility] 08/15/2015 - 6/07-2015 for right-sided weakness as well as forgetfulness. No TPA was administered. Urine tox negative CTs head showed no acute findings. MRI showed small acute infarct posterior right MCA territory although "some question this may have been an old infarct EEG showed no epileptiform activity. TTE unremarkable. Some question whether there was some psychologic component to this.  Another admission for fever in October 2017 MRI showed small acute and subacute infarcts bilateral cerebral hemisphere. Urinalysis and tox screen negative, workup for endocarditis is negative, LP. It was stenosis at the PCA P1 segment, lower extremity distal DVT on the right not placed on chronic anticoagulation multiple CVAs most recent at [facility] on 04/2017 and [facility] with discharge on 07/10/17 after L- CVA. She was readmitted on 07/11/17 with worsening of slurred speech and right sided weakness. MRI brain showed few new strokes in parasagittal cortical and subcortical left frontal lobe. Work up was negative for  vasculitis, hypercoagulopathy and Dr. Pearlean Brownie felt tha stroke due to advanced intracranial atherosclerosis HPI   Patient presents for a WebEx visit.  Both audio and video were working properly.  Patient was accompanied by her son. Chief complaint is wanting G-tube out. G-tube placed approximately 1 year ago for severe dysphagia post stroke. Over the last 2 months she has not used it.  She is eating well and drinking well.  She is also takes her pills by mouth although does take a long time for her to swallow her pills with water. She has had no coughing or pulmonary infections. She states that her G-tube hurts her but her son indicates there has been no redness or drainage.  Sons secondary concern is that the patient has become less active.  She is having more problems with her mobility.  She has not developed any new weakness.  No new swallowing or speech problems. She was referred to outpatient therapy but due to her orthostatic hypotension the therapist did not feel comfortable working with her.  She does have a long-term issue with this and has a standing tolerance of no more than about a minute before she becomes orthostatic.  She has seen cardiology and has had physical measures such as TED hose placement and abdominal binder. Pain Inventory Average Pain 0 Pain Right Now 0 My pain is na  In the last 24 hours, has pain interfered with the following? General activity 0 Relation with others 0 Enjoyment of life 0 What TIME of day is your pain at its worst? na Sleep (in general) Fair  Pain is worse with: na Pain improves with: na Relief from Meds: na  Mobility use  a wheelchair needs help with transfers  Function disabled: date disabled na I need assistance with the following:  feeding, dressing, bathing, toileting, meal prep, household duties and shopping  Neuro/Psych weakness numbness  Prior Studies Any changes since last visit?  no  Physicians involved in your care Any  changes since last visit?  no Cardiologist   Family History  Problem Relation Age of Onset  . Stroke Father        64s  . Heart attack Father 45  . COPD Mother   . Heart failure Brother   . Cancer Maternal Grandmother        unknown   . COPD Unknown   . Heart failure Maternal Grandfather   . Hypertension Maternal Grandfather   . Cancer Paternal Grandfather        lung  . Diabetes Paternal Grandmother    Social History   Socioeconomic History  . Marital status: Married    Spouse name: Not on file  . Number of children: Not on file  . Years of education: Not on file  . Highest education level: Not on file  Occupational History  . Occupation: Data processing manager  Social Needs  . Financial resource strain: Not on file  . Food insecurity:    Worry: Not on file    Inability: Not on file  . Transportation needs:    Medical: Not on file    Non-medical: Not on file  Tobacco Use  . Smoking status: Never Smoker  . Smokeless tobacco: Never Used  Substance and Sexual Activity  . Alcohol use: No    Alcohol/week: 0.0 standard drinks    Comment: rare   . Drug use: No  . Sexual activity: Not Currently  Lifestyle  . Physical activity:    Days per week: Not on file    Minutes per session: Not on file  . Stress: Not on file  Relationships  . Social connections:    Talks on phone: Not on file    Gets together: Not on file    Attends religious service: Not on file    Active member of club or organization: Not on file    Attends meetings of clubs or organizations: Not on file    Relationship status: Not on file  Other Topics Concern  . Not on file  Social History Narrative  . Not on file   Past Surgical History:  Procedure Laterality Date  . APPENDECTOMY    . CHOLECYSTECTOMY    . FOOT GANGLION EXCISION    . HERNIA REPAIR    . IR GASTROSTOMY TUBE MOD SED  07/30/2017  . IR GENERIC HISTORICAL  12/30/2015   IR ANGIO INTRA EXTRACRAN SEL COM CAROTID INNOMINATE BILAT MOD SED  12/30/2015 Julieanne Cotton, MD [facility]  . IR GENERIC HISTORICAL  12/30/2015   IR ANGIO VERTEBRAL SEL VERTEBRAL BILAT MOD SED 12/30/2015 Julieanne Cotton, MD [facility]  . LOOP RECORDER INSERTION N/A 07/13/2017   Procedure: LOOP RECORDER INSERTION;  Surgeon: Hillis Range, MD;  Location: [facility] INVASIVE CV LAB;  Service: Cardiovascular;  Laterality: N/A;  . TEE WITHOUT CARDIOVERSION N/A 04/19/2015   Procedure: TRANSESOPHAGEAL ECHOCARDIOGRAM (TEE);  Surgeon: Jake Bathe, MD;  Location: [facility] ENDOSCOPY;  Service: Cardiovascular;  Laterality: N/A;  . VAGINAL HYSTERECTOMY     Past Medical History:  Diagnosis Date  . Diabetes mellitus without complication (HCC)   . Headache   . Hyperlipidemia   . Hypertension   . Stroke (HCC)   .  TIA (transient ischemic attack)    BP (!) 89/65 Comment: pt reported, webex  Pulse 70 Comment: pt reported, webex  Opioid Risk Score:   Fall Risk Score:  `1  Depression screen PHQ 2/9  Depression screen PHQ 2/9 04/10/2016  Decreased Interest 2  Down, Depressed, Hopeless 2  PHQ - 2 Score 4  Altered sleeping 3  Tired, decreased energy 3  Change in appetite 0  Feeling bad or failure about yourself  1  Trouble concentrating 3  Moving slowly or fidgety/restless 3  Suicidal thoughts 1  PHQ-9 Score 18  Difficult doing work/chores Extremely dIfficult     Review of Systems  Constitutional: Negative.   HENT: Negative.   Eyes: Negative.   Respiratory: Negative.   Cardiovascular: Negative.   Gastrointestinal: Negative.   Endocrine: Negative.   Genitourinary: Negative.   Musculoskeletal: Negative.   Skin: Negative.   Allergic/Immunologic: Negative.   Neurological: Positive for weakness and numbness.  Hematological: Negative.   Psychiatric/Behavioral: Positive for confusion.  All other systems reviewed and are negative.      Objective:   Physical Exam  Limited examination due to WebEx.  She has her chronic dysarthria and mild expressive  language deficits Her motor strength is antigravity with mild weakness on the right side but is able to abduct shoulders flex and extend elbows as well as wrists and fingers. She is also able to perform finger to thumb opposition on the right side but slower than on the left side.  She is also performing finger-nose bilaterally The G-tube does not have any erythema.  The site is without drainage      Assessment & Plan:  1.  History of multiple strokes most recent recently left-sided with residual right-sided hemiparesis dysarthria and mild aphasia.  Her swallowing has improved and no longer requires PEG tube.  Will make referral [facility] to [facility] this.  We will also make referral to home health PT OT to work with the patient she now has Medicare in addition to her Medicaid and it is a new calendar year.  Goals would be to improve transfers increase right upper extremity functional usage Discussed with patient and her son agree with plan  PMR f/u in 6wks

## 2018-06-28 ENCOUNTER — Encounter: Payer: Self-pay | Admitting: Emergency Medicine

## 2018-06-28 ENCOUNTER — Other Ambulatory Visit: Payer: Self-pay

## 2018-06-28 ENCOUNTER — Emergency Department
Admission: EM | Admit: 2018-06-28 | Discharge: 2018-06-28 | Disposition: A | Discharge status: Home / Self Care | Payer: Medicare Other | Attending: Emergency Medicine | Admitting: Emergency Medicine

## 2018-06-28 ENCOUNTER — Emergency Department: Payer: Medicare Other

## 2018-06-28 DIAGNOSIS — R0789 Other chest pain: Secondary | ICD-10-CM | POA: Diagnosis not present

## 2018-06-28 DIAGNOSIS — R531 Weakness: Secondary | ICD-10-CM | POA: Diagnosis not present

## 2018-06-28 DIAGNOSIS — Z7901 Long term (current) use of anticoagulants: Secondary | ICD-10-CM | POA: Insufficient documentation

## 2018-06-28 DIAGNOSIS — E119 Type 2 diabetes mellitus without complications: Secondary | ICD-10-CM | POA: Insufficient documentation

## 2018-06-28 DIAGNOSIS — Z91040 Latex allergy status: Secondary | ICD-10-CM | POA: Insufficient documentation

## 2018-06-28 DIAGNOSIS — Z794 Long term (current) use of insulin: Secondary | ICD-10-CM | POA: Insufficient documentation

## 2018-06-28 DIAGNOSIS — Z79899 Other long term (current) drug therapy: Secondary | ICD-10-CM | POA: Diagnosis not present

## 2018-06-28 DIAGNOSIS — R079 Chest pain, unspecified: Secondary | ICD-10-CM | POA: Diagnosis not present

## 2018-06-28 DIAGNOSIS — I10 Essential (primary) hypertension: Secondary | ICD-10-CM | POA: Diagnosis not present

## 2018-06-28 LAB — FIBRIN DERIVATIVES D-DIMER (ARMC ONLY): Fibrin derivatives D-dimer (ARMC): 341.14 ng/mL (FEU) (ref 0.00–499.00)

## 2018-06-28 LAB — CBC
HCT: 30.7 % — ABNORMAL LOW (ref 36.0–46.0)
Hemoglobin: 9.7 g/dL — ABNORMAL LOW (ref 12.0–15.0)
MCH: 29.1 pg (ref 26.0–34.0)
MCHC: 31.6 g/dL (ref 30.0–36.0)
MCV: 92.2 fL (ref 80.0–100.0)
Platelets: 201 10*3/uL (ref 150–400)
RBC: 3.33 MIL/uL — ABNORMAL LOW (ref 3.87–5.11)
RDW: 12.9 % (ref 11.5–15.5)
WBC: 4.4 10*3/uL (ref 4.0–10.5)
nRBC: 0 % (ref 0.0–0.2)

## 2018-06-28 LAB — BASIC METABOLIC PANEL
Anion gap: 7 (ref 5–15)
BUN: 24 mg/dL — ABNORMAL HIGH (ref 6–20)
CO2: 28 mmol/L (ref 22–32)
Calcium: 8.8 mg/dL — ABNORMAL LOW (ref 8.9–10.3)
Chloride: 103 mmol/L (ref 98–111)
Creatinine, Ser: 0.83 mg/dL (ref 0.44–1.00)
GFR calc Af Amer: >60 mL/min (ref >60)
GFR calc non Af Amer: >60 mL/min (ref >60)
Glucose, Bld: 142 mg/dL — ABNORMAL HIGH (ref 70–99)
Potassium: 3.7 mmol/L (ref 3.5–5.1)
Sodium: 138 mmol/L (ref 135–145)

## 2018-06-28 LAB — TROPONIN I
Troponin I: <0.03 ng/mL (ref <0.03)
Troponin I: <0.03 ng/mL (ref <0.03)

## 2018-06-28 MED ORDER — METOCLOPRAMIDE HCL 5 MG/ML IJ SOLN
10.0000 mg | Freq: Once | INTRAMUSCULAR | Status: AC
  Administered 2018-06-28: 19:00:00 10 mg via INTRAVENOUS
  Filled 2018-06-28: qty 2

## 2018-06-28 MED ORDER — MORPHINE SULFATE (PF) 4 MG/ML IV SOLN
4.0000 mg | Freq: Once | INTRAVENOUS | Status: AC
  Administered 2018-06-28: 4 mg via INTRAVENOUS
  Filled 2018-06-28: qty 1

## 2018-06-28 NOTE — ED Notes (Signed)
Cleaned pt and provided pt with new brief and placed purwik.

## 2018-06-28 NOTE — ED Notes (Signed)
Report from [place], [place].

## 2018-06-28 NOTE — ED Notes (Signed)
md on phone speaking with daughter.

## 2018-06-28 NOTE — ED Notes (Signed)
Pt's daughter telephoned requesting to speak with MD regarding mother's care and mother's "confusion". md notified.

## 2018-06-28 NOTE — ED Triage Notes (Signed)
PT arrives via ems from home with complaints of left sided chest pain that started last night. Pt reports she took tums last night while eliminated the pain. Pt states today at 1500 the pain started again and has been constant. Pt hypertension at 190/100 for EMS and given 1 NTG spray and 324mg  of asa;pt denies relief from regimen. Pt reports the pain remains in the left side of her chest and feels sharp. HX of stroke causing pt to have minimal function o her legs.

## 2018-06-28 NOTE — ED Provider Notes (Signed)
[facility] Emergency Department Provider Note ____________________________________________   None    (approximate)  I have reviewed the triage vital signs and the nursing notes.   HISTORY  Chief Complaint Chest Pain    HPI Kelly Romero is a 53 y.o. female with PMH as noted below including multiple prior strokes who presents with chest pain, acute onset last night, substernal and left-sided in location, and described as pressure-like.  She reports associated shortness of breath and nausea.  She denies fever, vomiting, or abdominal pain.  She had no relief after aspirin and nitroglycerin given by EMS.  She states that she has had similar pain in the past however.  She reports mild left ankle swelling but no calf or proximal leg swelling or pain.  Past Medical History:  Diagnosis Date  . Diabetes mellitus without complication (HCC)   . Headache   . Hyperlipidemia   . Hypertension   . Stroke (HCC)   . TIA (transient ischemic attack)     Patient Active Problem List   Diagnosis Date Noted  . Hemiparesis affecting right side as late effect of cerebrovascular accident (HCC) 09/14/2017  . Extension of stroke (HCC) 08/06/2017  . Inadequate dietary intake 08/06/2017  . Hypoalbuminemia due to protein-calorie malnutrition (HCC)   . Orthostatic hypotension   . Poorly controlled type 2 diabetes mellitus with peripheral neuropathy (HCC)   . Emotional lability   . Thrombotic stroke (HCC) 07/13/2017  . Diabetes mellitus type 2 in obese (HCC)   . History of CVA (cerebrovascular accident)   . Benign essential HTN   . Late effect of cerebrovascular accident (CVA)   . Acute blood loss anemia   . Morbid obesity (HCC)   . Right hemiparesis (HCC)   . Cryptogenic stroke (HCC) 07/08/2017  . Chest pain 11/06/2016  . Mid (multi infarct dementia), without behavioral disturbance (HCC) 04/10/2016  . Aphasia as late effect of stroke 04/10/2016  . Encephalopathy   .  Cerebral thrombosis with cerebral infarction 12/29/2015  . Hyperglycemia   . Hyperlipidemia   . Hypertensive emergency   . Deep venous thrombosis (HCC)   . AKI (acute kidney injury) (HCC) 12/26/2015  . Altered mental status   . Stroke-like symptoms   . Acute encephalopathy 08/17/2015  . Anxiety and depression   . CVA (cerebrovascular accident due to intracerebral hemorrhage) (HCC) 08/15/2015  . Stroke (HCC)   . Encephalopathy, metabolic 12/28/2014  . 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 Essential hypertension 12/28/2014  . New onset headache 11/02/2014    Past Surgical History:  Procedure Laterality Date  . APPENDECTOMY    . CHOLECYSTECTOMY    . FOOT GANGLION EXCISION    . HERNIA REPAIR    . IR GASTROSTOMY TUBE MOD SED  07/30/2017  . IR GENERIC HISTORICAL  12/30/2015   IR ANGIO INTRA EXTRACRAN SEL COM CAROTID INNOMINATE BILAT MOD SED 12/30/2015 Julieanne Cotton, MD [facility]-INTERV RAD  . IR GENERIC HISTORICAL  12/30/2015   IR ANGIO VERTEBRAL SEL VERTEBRAL BILAT MOD SED 12/30/2015 Julieanne Cotton, MD [facility]  . LOOP RECORDER INSERTION N/A 07/13/2017   Procedure: LOOP RECORDER INSERTION;  Surgeon: Hillis Range, MD;  Location: [facility] INVASIVE CV LAB;  Service: Cardiovascular;  Laterality: N/A;  . TEE WITHOUT CARDIOVERSION N/A 04/19/2015   Procedure: TRANSESOPHAGEAL ECHOCARDIOGRAM (TEE);  Surgeon: Jake Bathe, MD;  Location: [facility] ENDOSCOPY;  Service: Cardiovascular;  Laterality: N/A;  . VAGINAL HYSTERECTOMY      Prior  to Admission medications   Medication Sig Start Date End Date Taking? Authorizing Provider  clopidogrel (PLAVIX) 75 MG tablet Take 1 tablet (75 mg total) by mouth daily. 08/06/17   Love, Evlyn Kanner, PA-C  divalproex (DEPAKOTE) 250 MG DR tablet Take 1 tablet (250 mg total) by mouth 2 (two) times daily. 08/06/17 08/06/18  Love, Evlyn Kanner, PA-C  escitalopram (LEXAPRO) 10 MG tablet Take 1 tablet (10 mg total) by mouth daily.  01/07/18   [name], DO  insulin detemir (LEVEMIR) 100 UNIT/ML injection Inject 0.2 mLs (20 Units total) into the skin daily before breakfast. Patient taking differently: Inject 15 Units into the skin daily before breakfast.  08/06/17   Love, Evlyn Kanner, PA-C  lisinopril (PRINIVIL,ZESTRIL) 2.5 MG tablet Take 2.5 mg by mouth daily.    [provider]  metoprolol tartrate (LOPRESSOR) 25 MG tablet Take 0.5 tablets (12.5 mg total) by mouth 2 (two) times daily. 08/06/17   Love, Evlyn Kanner, PA-C  polyethylene glycol [facility] / Ethelene Hal) packet May take 17 g with 8 oz water per peg tube daily prn 08/10/17   Kirsteins, Victorino Sparrow, MD  rosuvastatin (CRESTOR) 40 MG tablet Take 1 tablet (40 mg total) by mouth at bedtime. 08/06/17   Love, Evlyn Kanner, PA-C    Allergies Erythromycin and Latex  Family History  Problem Relation Age of Onset  . Stroke Father        64s  . Heart attack Father 64  . COPD Mother   . Heart failure Brother   . Cancer Maternal Grandmother        unknown   . COPD Other   . Heart failure Maternal Grandfather   . Hypertension Maternal Grandfather   . Cancer Paternal Grandfather        lung  . Diabetes Paternal Grandmother     Social History Social History   Tobacco Use  . Smoking status: Never Smoker  . Smokeless tobacco: Never Used  Substance Use Topics  . Alcohol use: No    Alcohol/week: 0.0 standard drinks    Comment: rare   . Drug use: No    Review of Systems  Constitutional: No fever/chills. Eyes: No redness. ENT: No neck pain. Cardiovascular: Positive for chest pain. Respiratory: Positive for shortness of breath. Gastrointestinal: No vomiting or diarrhea.  Genitourinary: Negative for flank pain.  Musculoskeletal: Negative for back pain. Skin: Negative for rash. Neurological: Negative for headache.   ____________________________________________   PHYSICAL EXAM:  VITAL SIGNS: ED Triage Vitals  Enc Vitals Group     BP 06/28/18 1656 (!)  146/76     Pulse Rate 06/28/18 1656 66     Resp 06/28/18 1656 16     Temp 06/28/18 1656 98.2 F (36.8 C)     Temp Source 06/28/18 1656 Oral     SpO2 06/28/18 1656 98 %     Weight 06/28/18 1657 189 lb (85.7 kg)     Height 06/28/18 1657  (1.651 m)     Head Circumference --      Peak Flow --      Pain Score 06/28/18 1657 8     Pain Loc --      Pain Edu? --      Excl. in GC? --     Constitutional: Alert and oriented.  Relatively comfortable appearing and in no acute distress. Eyes: Conjunctivae are normal.  Head: Atraumatic. Nose: No congestion/rhinnorhea. Mouth/Throat: Mucous membranes are moist.   Neck: Normal range of motion.  Cardiovascular: Normal rate, regular rhythm. Good peripheral circulation. Respiratory: Normal respiratory effort.  No retractions. Gastrointestinal: No distention.  Genitourinary: No CVA tenderness. Musculoskeletal: Trace bilateral lower extremity edema.  No calf or popliteal swelling or tenderness.  Extremities warm and well perfused.  Neurologic: Somewhat slurred speech, baseline for patient.  No acute focal neurologic deficits. Skin:  Skin is warm and dry. No rash noted. Psychiatric: Mood and affect are normal. Speech and behavior are normal.  ____________________________________________   LABS (all labs ordered are listed, but only abnormal results are displayed)  Labs Reviewed  BASIC METABOLIC PANEL - Abnormal; Notable for the following components:      Result Value   Glucose, Bld 142 (*)    BUN 24 (*)    Calcium 8.8 (*)    All other components within normal limits  CBC - Abnormal; Notable for the following components:   RBC 3.33 (*)    Hemoglobin 9.7 (*)    HCT 30.7 (*)    All other components within normal limits  TROPONIN I  FIBRIN DERIVATIVES D-DIMER ([facility] ONLY)  TROPONIN I  POC URINE PREG, ED   ____________________________________________  EKG  ED ECG REPORT I, Dionne Bucy, the attending physician, personally  viewed and interpreted this ECG.  Date: 06/28/2018 EKG Time: 1654 Rate: 68 Rhythm: normal sinus rhythm QRS Axis: normal Intervals: normal ST/T Wave abnormalities: Nonspecific T wave flattening laterally Narrative Interpretation: Nonspecific abnormalities with no significant change when compared to EKG of 11/05/2017; no evidence of acute ischemia  ____________________________________________  RADIOLOGY  CXR: No focal infiltrate or other acute abnormality  ____________________________________________   PROCEDURES  Procedure(s) performed: No  Procedures  Critical Care performed: No ____________________________________________   INITIAL IMPRESSION / ASSESSMENT AND PLAN / ED COURSE  Pertinent labs & imaging results that were available during my care of the patient were reviewed by me and considered in my medical decision making (see chart for details).  53 year old female with PMH as noted above presents with chest pain since last night which is nonexertional but is associated with some nausea and shortness of breath.  She had no relief from aspirin or nitroglycerin given by EMS.  I reviewed the past medical records in Epic.  The patient was most recently admitted to the hospital in 2019 with a stroke.  She has had no ED visits or admissions since then.  On exam the patient is overall relatively comfortable appearing.  Her vital signs are normal.  The remainder of the exam is unremarkable other than chronic neurologic findings which are baseline for the patient.  EKG shows nonspecific lateral T wave flattening which is unchanged from prior.  Given the patient's comorbidities and stroke history she is at increased risk for ACS, however I have a low suspicion for it on this presentation.  Differential also includes GERD or musculoskeletal pain.  Although the patient has no specific risk factors for pulmonary embolism, given her poor mobility and the somewhat atypical and  nonexertional nature of the pain this is also on the differential.  We will obtain basic labs, cardiac enzymes, d-dimer, chest x-ray, and reassess.  ----------------------------------------- 9:03 PM on 06/28/2018 -----------------------------------------  Chest x-ray, cardiac enzymes x2, and d-dimer were all negative.  The remainder of the lab work-up is unremarkable.  The patient has had no significant pain since her initial arrival.  The patient had mentioned wanting to talk to social work although this was already after the social worker was no longer available for the day.  I discussed this with the patient.  She states that she feels like she is a burden on her daughter who she lives with and wants to potentially be placed in a rehab facility.  However, she states that she feels totally safe at home and there is no urgency to this.  She feels comfortable going home.  Therefore at this time, there is no indication to have the patient board in the ED for urgent social work evaluation.  I advised her to pursue this through her primary care doctor.  I discussed the patient's status over the phone with her daughter, who confirmed that she was stable to be at home.  The patient is stable for discharge at this time.  Return precautions given, the patient expresses understanding. ________  Kelly Romero was evaluated in Emergency Department on 06/28/2018 for the symptoms described in the history of present illness. She was evaluated in the context of the global COVID-19 pandemic, which necessitated consideration that the patient might be at risk for infection with the SARS-CoV-2 virus that causes COVID-19. Institutional protocols and algorithms that pertain to the evaluation of patients at risk for COVID-19 are in a state of rapid change based on information released by regulatory bodies including the CDC and federal and state organizations. These policies and algorithms were followed during the  patient's care in the ED.   ____________________________________________   FINAL CLINICAL IMPRESSION(S) / ED DIAGNOSES  Final diagnoses:  Atypical chest pain      NEW MEDICATIONS STARTED DURING THIS VISIT:  New Prescriptions   No medications on file     Note:  This document was prepared using Dragon voice recognition software and may include unintentional dictation errors.    Dionne BucySiadecki, Queenie Aufiero, MD 06/28/18 2105

## 2018-06-28 NOTE — Discharge Instructions (Addendum)
Return to the ER for new, worsening, or persistent severe chest pain, difficulty breathing, weakness or lightheadedness, or any other new or worsening symptoms that concern you.  Follow-up with your regular doctor within the next 1 to 2 weeks.

## 2018-07-07 DIAGNOSIS — I69351 Hemiplegia and hemiparesis following cerebral infarction affecting right dominant side: Secondary | ICD-10-CM | POA: Diagnosis not present

## 2018-07-07 DIAGNOSIS — I69320 Aphasia following cerebral infarction: Secondary | ICD-10-CM | POA: Diagnosis not present

## 2018-07-07 DIAGNOSIS — I69322 Dysarthria following cerebral infarction: Secondary | ICD-10-CM | POA: Diagnosis not present

## 2018-07-07 DIAGNOSIS — Z794 Long term (current) use of insulin: Secondary | ICD-10-CM | POA: Diagnosis not present

## 2018-07-07 DIAGNOSIS — F0150 Vascular dementia without behavioral disturbance: Secondary | ICD-10-CM | POA: Diagnosis not present

## 2018-07-07 DIAGNOSIS — E1142 Type 2 diabetes mellitus with diabetic polyneuropathy: Secondary | ICD-10-CM | POA: Diagnosis not present

## 2018-07-07 DIAGNOSIS — Z7902 Long term (current) use of antithrombotics/antiplatelets: Secondary | ICD-10-CM | POA: Diagnosis not present

## 2018-07-07 DIAGNOSIS — I951 Orthostatic hypotension: Secondary | ICD-10-CM | POA: Diagnosis not present

## 2018-07-08 ENCOUNTER — Ambulatory Visit (INDEPENDENT_AMBULATORY_CARE_PROVIDER_SITE_OTHER): Payer: Medicare Other | Admitting: *Deleted

## 2018-07-08 ENCOUNTER — Other Ambulatory Visit: Payer: Self-pay

## 2018-07-08 ENCOUNTER — Telehealth: Payer: Self-pay | Admitting: *Deleted

## 2018-07-08 DIAGNOSIS — I69322 Dysarthria following cerebral infarction: Secondary | ICD-10-CM | POA: Diagnosis not present

## 2018-07-08 DIAGNOSIS — I951 Orthostatic hypotension: Secondary | ICD-10-CM | POA: Diagnosis not present

## 2018-07-08 DIAGNOSIS — I69320 Aphasia following cerebral infarction: Secondary | ICD-10-CM | POA: Diagnosis not present

## 2018-07-08 DIAGNOSIS — I639 Cerebral infarction, unspecified: Secondary | ICD-10-CM | POA: Diagnosis not present

## 2018-07-08 DIAGNOSIS — I69351 Hemiplegia and hemiparesis following cerebral infarction affecting right dominant side: Secondary | ICD-10-CM | POA: Diagnosis not present

## 2018-07-08 DIAGNOSIS — E1142 Type 2 diabetes mellitus with diabetic polyneuropathy: Secondary | ICD-10-CM | POA: Diagnosis not present

## 2018-07-08 DIAGNOSIS — F0150 Vascular dementia without behavioral disturbance: Secondary | ICD-10-CM | POA: Diagnosis not present

## 2018-07-08 LAB — CUP PACEART REMOTE DEVICE CHECK
Date Time Interrogation Session: 20200424224558
Implantable Pulse Generator Implant Date: 20190430

## 2018-07-08 NOTE — Telephone Encounter (Signed)
Cannot think of a reason to justify trapeze , we do not use for CVA pts

## 2018-07-08 NOTE — Telephone Encounter (Signed)
Elmer Bales PT [facility] called for POC of 1wk1,2wk4 for PT and is requesting OT eval, [facility] to assist with bathing 2wk4, and a MSW for counseling for depression and LTC planning with family.  Approval given.  She also is requesting an order be faxed to [facility] (prev known as [facility]) for a trapeze bar for the bed to [phone], and wanted Dr Wynn Banker to know she is still having the severe orthostatic BPs (not new) and cardiology is working with her as well. Please advise on trapeze request.

## 2018-07-08 NOTE — Telephone Encounter (Signed)
Emily notified.

## 2018-07-11 ENCOUNTER — Ambulatory Visit: Payer: Medicaid Other | Admitting: Neurology

## 2018-07-11 DIAGNOSIS — I69351 Hemiplegia and hemiparesis following cerebral infarction affecting right dominant side: Secondary | ICD-10-CM | POA: Diagnosis not present

## 2018-07-11 DIAGNOSIS — I69322 Dysarthria following cerebral infarction: Secondary | ICD-10-CM | POA: Diagnosis not present

## 2018-07-11 DIAGNOSIS — I69320 Aphasia following cerebral infarction: Secondary | ICD-10-CM | POA: Diagnosis not present

## 2018-07-11 DIAGNOSIS — E1142 Type 2 diabetes mellitus with diabetic polyneuropathy: Secondary | ICD-10-CM | POA: Diagnosis not present

## 2018-07-11 DIAGNOSIS — I951 Orthostatic hypotension: Secondary | ICD-10-CM | POA: Diagnosis not present

## 2018-07-11 DIAGNOSIS — F0150 Vascular dementia without behavioral disturbance: Secondary | ICD-10-CM | POA: Diagnosis not present

## 2018-07-12 ENCOUNTER — Telehealth: Payer: Self-pay

## 2018-07-12 DIAGNOSIS — I69351 Hemiplegia and hemiparesis following cerebral infarction affecting right dominant side: Secondary | ICD-10-CM | POA: Diagnosis not present

## 2018-07-12 DIAGNOSIS — I951 Orthostatic hypotension: Secondary | ICD-10-CM | POA: Diagnosis not present

## 2018-07-12 DIAGNOSIS — I69322 Dysarthria following cerebral infarction: Secondary | ICD-10-CM | POA: Diagnosis not present

## 2018-07-12 DIAGNOSIS — E1142 Type 2 diabetes mellitus with diabetic polyneuropathy: Secondary | ICD-10-CM | POA: Diagnosis not present

## 2018-07-12 DIAGNOSIS — F0150 Vascular dementia without behavioral disturbance: Secondary | ICD-10-CM | POA: Diagnosis not present

## 2018-07-12 DIAGNOSIS — I69320 Aphasia following cerebral infarction: Secondary | ICD-10-CM | POA: Diagnosis not present

## 2018-07-12 NOTE — Telephone Encounter (Signed)
I thought I had already ordered IR for Tube removal,please check on order OK for Speech therapy

## 2018-07-12 NOTE — Telephone Encounter (Signed)
Amy Mckee, ST from [facility] called requesting verbal orders for HHST 2wk4 is this okay. Also she states that family has not been flushing her PEG tube. According to note 06/27/2018-Her swallowing has improved and no longer requires PEG tube.  Will make referral to interventional radiology department at [facility] to remove this. Please advise.

## 2018-07-13 ENCOUNTER — Other Ambulatory Visit (HOSPITAL_COMMUNITY): Payer: Self-pay | Admitting: Physical Medicine & Rehabilitation

## 2018-07-13 DIAGNOSIS — I69320 Aphasia following cerebral infarction: Secondary | ICD-10-CM | POA: Diagnosis not present

## 2018-07-13 DIAGNOSIS — F0150 Vascular dementia without behavioral disturbance: Secondary | ICD-10-CM | POA: Diagnosis not present

## 2018-07-13 DIAGNOSIS — I951 Orthostatic hypotension: Secondary | ICD-10-CM | POA: Diagnosis not present

## 2018-07-13 DIAGNOSIS — N390 Urinary tract infection, site not specified: Secondary | ICD-10-CM | POA: Diagnosis not present

## 2018-07-13 DIAGNOSIS — I69391 Dysphagia following cerebral infarction: Principal | ICD-10-CM

## 2018-07-13 DIAGNOSIS — I69359 Hemiplegia and hemiparesis following cerebral infarction affecting unspecified side: Principal | ICD-10-CM

## 2018-07-13 DIAGNOSIS — E1142 Type 2 diabetes mellitus with diabetic polyneuropathy: Secondary | ICD-10-CM | POA: Diagnosis not present

## 2018-07-13 DIAGNOSIS — I69322 Dysarthria following cerebral infarction: Secondary | ICD-10-CM | POA: Diagnosis not present

## 2018-07-13 DIAGNOSIS — I69351 Hemiplegia and hemiparesis following cerebral infarction affecting right dominant side: Secondary | ICD-10-CM | POA: Diagnosis not present

## 2018-07-13 NOTE — Telephone Encounter (Signed)
Amy has been notified. 

## 2018-07-13 NOTE — Telephone Encounter (Signed)
Kelly Romero notified about ST.  PEG removal appt has not been made yet. Interventional Radiology is sending over an order to be signed to set appt up. In the meantime does the family need to flush the tube?

## 2018-07-13 NOTE — Telephone Encounter (Signed)
No need to flush tube if it is not being used

## 2018-07-14 DIAGNOSIS — F0150 Vascular dementia without behavioral disturbance: Secondary | ICD-10-CM | POA: Diagnosis not present

## 2018-07-14 DIAGNOSIS — I69322 Dysarthria following cerebral infarction: Secondary | ICD-10-CM | POA: Diagnosis not present

## 2018-07-14 DIAGNOSIS — I69320 Aphasia following cerebral infarction: Secondary | ICD-10-CM | POA: Diagnosis not present

## 2018-07-14 DIAGNOSIS — I951 Orthostatic hypotension: Secondary | ICD-10-CM | POA: Diagnosis not present

## 2018-07-14 DIAGNOSIS — E1142 Type 2 diabetes mellitus with diabetic polyneuropathy: Secondary | ICD-10-CM | POA: Diagnosis not present

## 2018-07-14 DIAGNOSIS — I69351 Hemiplegia and hemiparesis following cerebral infarction affecting right dominant side: Secondary | ICD-10-CM | POA: Diagnosis not present

## 2018-07-15 ENCOUNTER — Telehealth: Payer: Self-pay | Admitting: *Deleted

## 2018-07-15 ENCOUNTER — Other Ambulatory Visit: Payer: Self-pay

## 2018-07-15 ENCOUNTER — Encounter (HOSPITAL_COMMUNITY): Payer: Self-pay | Admitting: Diagnostic Radiology

## 2018-07-15 ENCOUNTER — Ambulatory Visit (HOSPITAL_COMMUNITY)
Admission: RE | Admit: 2018-07-15 | Discharge: 2018-07-15 | Disposition: A | Discharge status: Home / Self Care | Payer: Medicare Other | Source: Ambulatory Visit | Attending: Physical Medicine & Rehabilitation | Admitting: Physical Medicine & Rehabilitation

## 2018-07-15 DIAGNOSIS — F0150 Vascular dementia without behavioral disturbance: Secondary | ICD-10-CM | POA: Diagnosis not present

## 2018-07-15 DIAGNOSIS — Z931 Gastrostomy status: Secondary | ICD-10-CM | POA: Diagnosis not present

## 2018-07-15 DIAGNOSIS — Z431 Encounter for attention to gastrostomy: Secondary | ICD-10-CM | POA: Insufficient documentation

## 2018-07-15 DIAGNOSIS — I69391 Dysphagia following cerebral infarction: Secondary | ICD-10-CM | POA: Diagnosis not present

## 2018-07-15 DIAGNOSIS — I69320 Aphasia following cerebral infarction: Secondary | ICD-10-CM | POA: Diagnosis not present

## 2018-07-15 DIAGNOSIS — I69351 Hemiplegia and hemiparesis following cerebral infarction affecting right dominant side: Secondary | ICD-10-CM | POA: Diagnosis not present

## 2018-07-15 DIAGNOSIS — I951 Orthostatic hypotension: Secondary | ICD-10-CM | POA: Diagnosis not present

## 2018-07-15 DIAGNOSIS — I69359 Hemiplegia and hemiparesis following cerebral infarction affecting unspecified side: Secondary | ICD-10-CM | POA: Insufficient documentation

## 2018-07-15 DIAGNOSIS — I69322 Dysarthria following cerebral infarction: Secondary | ICD-10-CM | POA: Diagnosis not present

## 2018-07-15 DIAGNOSIS — E1142 Type 2 diabetes mellitus with diabetic polyneuropathy: Secondary | ICD-10-CM | POA: Diagnosis not present

## 2018-07-15 HISTORY — PX: IR GASTROSTOMY TUBE REMOVAL: IMG5492

## 2018-07-15 MED ORDER — LIDOCAINE VISCOUS HCL 2 % MT SOLN
OROMUCOSAL | Status: AC
  Filled 2018-07-15: qty 15

## 2018-07-15 NOTE — Procedures (Signed)
Interventional Radiology Procedure:   Indications: Gastrostomy tube is no longer needed.  Procedure: Removal of gastrostomy   Findings: Complete removal of the gastrostomy tube.  Complications: None     EBL: None  Plan: Resume diet.  Discharge to home.       Caston Coopersmith R. Lowella Dandy, MD  Pager: [phone]

## 2018-07-15 NOTE — Telephone Encounter (Signed)
Kelly Romero, OT, [facility] left a message asking for verbal orders for  HHOT 2week4.  Medical record reviewed. Social work note reviewed.  Verbal orders given per office protocol.

## 2018-07-15 NOTE — Progress Notes (Signed)
Carelink Summary Report / Loop Recorder 

## 2018-07-15 NOTE — Progress Notes (Addendum)
Virtual Visit via Video Note The purpose of this virtual visit is to provide medical care while limiting exposure to the novel coronavirus.    Consent was obtained for video visit:  Yes Answered questions that patient had about telehealth interaction:  Yes I discussed the limitations, risks, security and privacy concerns of performing an evaluation and management service by telemedicine. I also discussed with the patient that there may be a patient responsible charge related to this service. The patient expressed understanding and agreed to proceed.  Pt location: Home Physician Location: Home Name of referring provider:  Noni Saupe, MD I connected with Kelly Romero at patients initiation/request on 07/18/2018 at  1:00 PM EDT by video enabled telemedicine application and verified that I am speaking with the correct person using two identifiers. Pt MRN:  [id number] Pt DOB:  [birth year] Video Participants:  Kelly Romero;  Her husband   History of Present Illness:  Kelly Romero is a 53 year old right-handed Caucasian woman with diabetes, hypertension, orthostatic hypotension and depression who follows up for recurrent cryptogenic strokes.  History supplemented by her husband and cardiology notes.  I STROKE: Update: Current medications: Plavix 75 mg daily, Crestor  She is currently with implantable loop recorder.  No significant arrhythmia has yet been identified.    Blood sugars were running high last night.  They are better now.  Appetite is not as good as it used to be but she eats what she wants.  She continues to deal with orthostatic hypotension.  She wears compression stockings and is trying to increase fluid intake.    Swallowing has improved and no longer needs PEG.  It was removed on Friday.  She has had increase in depression even since last visit.   History: She was in [facility] on 12/07/14 for stroke symptoms.She developed slurred  speech and right arm numbness.She was agitated, yelling and screaming in the ED.She was given Ativan and Haldol.CT of head was unremarkable.Repeat CT of head two days later was negative.CXR negative.Urine tox screen was positive for opioids.She was found to have severally elevated blood pressure with systolic at 240 and higher.Hgb A1c was 12 with serum glucose level of 400.She did not have fever or leukocytosis, and UA was borderline with trace leukocytes but negative nitrite.She was treated with 5 days of IV Rocephin.Troponins were negative.Etiology was unknown but TIA was suspected as a possibility.  She had a similar stroke-like episode on 02/22/15. She woke up with headache and later developed slurred speech. She was admitted to [facility], where MRI of brain revealed 2 mm left occipital subacute infarct. 2D echo was performed, which appeared unremarkable, but didn't make mention of a PFO. Carotid doppler again showed no hemodynamically significant ICA stenosis. Hypercoagulable panel, ANA, lupus panel, antiextractable nuclear antigen were negative. LDL was 130. Plavix was added to her ASA. Crestor was increased to  daily. Labs from August 2016 include Sed Rate 30, ANA negative, ANCA screen negative, antiextractable nuclear antigen (RNP abs, Smith abs) negative. She had a TEE on 04/19/15, which revealed EF 60-65% with no PFO or atrial septal defect or other cardiac source of emboli. After 3 months on dual antiplatelet therapy, she was continued on Plavix alone. She was on amitriptyline  at bedtime to help with depression and headache.  She was admitted to [facility] between 08/15/15 and 08/19/15 for right-sided (however some notes state left-sided) weakness and numbness associated with forgetfulness and agitation. Blood pressure was elevated.  NIHSS was 4. Due to minimal symptoms, she did not receive tPA. Urine tox screen was negative. CT of head showed no  acute findings. MRI of brain with and without contrast revealed possible small acute infarct in the posterior right MCA territory. However, it appears on previous imaging, which suggests it is likely an old finding. Neurology believed it to be an old infarct, as it apparently was seen on a prior MRI in February 2017. TTE again was unremarkable with EF of 65-70%. LDL was 231. Hgb A1c was 10.5. Exact cause of encephalopathy was unclear. She underwent an EEG on 08/18/15, which revealed mild to moderate generalized background slowing but no epileptiform activity. Neurology felt symptoms may be psychogenic. She was started on Depakote  twice daily for possible seizures. She was maintained on ASA  and Plavix, as well as Rosuvastatin . She reports word-finding difficulties, which has been ongoing for several months.  She was readmitted to [facility] from 12/26/15 to 12/22/2015 for altered mental status, fever of 103 and elevated blood pressure with systolic in 220s. MRI of brain revealed small acute and subacute infarcts in the bilateral cerebral hemisphere. Due to fever, she was started on IV antibiotics and acyclovir. UA and urine tox screen was negative. Blood cultures showed 1 of 2 positive for gram +, likely contaminated. Endocarditis was not suspected. She underwent LP. CSF cell count was 1, gram stain negative, protein 49, glucose 98, oligoclonal bands negative, fungal cultures, CMV, VZV, VDRL and HSV were negative. In the serum, HIV and RPR were nonreactive. Arbovirus panel was negative. ACE was 26. Hypercoagulable panel (including homocysteine, lupus anticoagulant, beta-2-glycoprotein, cardiolipin antibodies) was negative. TTE showed normal LVEF 55-60% and TCD showed clinically insignificant right to left shunt with valsalva only. Cerebral angiogram revealed mild athersclerosis with 65-70% stenosis of left MCA superior division, severe stenosis of proximal PCA P1 segment and  tapered stenosis of distal right pericallosal artery. Hgb A1c was 13. LDL was 153. Lower extremity doppler revealed distal DVT in the right lower extremity. Plavix was switched to ASA  daily. DVT is being monitored and managed by her PCP. Recommendation was to recheck venous ultrasound every 2 weeks and if there is any proximal extension, then the switch to anticoagulation. 30 day Holter monitor from late 2017 revealed no arrhythmia.  She was admitted to [facility] from on 10/02/16 for headache, dizziness and hypertension. CT of head from 09/30/16 showed a subacute to chronic infarct in the left parietooccipital junction. CTA of head from 10/01/16 demonstrated high-grade stenosis or occlusion at the left P2 segment bifurcation with nonvisualization of the superior left P3 segment. MRI of brain without contrast from 10/02/16 demonstrated a small acute infarct in the right body of the corpus callosum. Carotid doppler showed moderate heterogeneous and calcified plaque without hemodynamically significant stenosis. 2D echo showed EF 60-65% with no PFO or other cardiac source of emboli. Telemetry revealed no atrial fibrillation. LDL was reportedly 239. Rosuvastatin  was changed to atorvastatin . She was maintained on ASA  and Plavix . She was also treated for UTI.  She had a nuclear stress test on 11/26/16 which was normal with EF 55-65%. For uncontrolled hypertension, she had a renal ultrasound on 11/25/16, which was normal.  She was admitted to [facility] in [place] from 04/27/17 to 04/30/17 for stroke. She presented with one week history of intermittent slurred speech. CT of head showed no acute abnormalities. CTA of head showed multiple stable segments of mild to moderate stenosis involving  the ACA, MCA and PCA distributions as well as stable severe stenosis of left M2. Compared to prior CTA from July 2018, she demonstrated interval occlusion of right PCA  distal calcarine branch. CTA of neck showed bilateral 60% proximal ICA stenosis. MRI of brain with and without contrast showed multiple subacute infarcts involving the the right lateral lenticulostriate territory, right parietal region and cortical cerebral hemispheres. TTE showed EF 65-70% with negative bubble study. LDL was 347. Hgb A1c was 13.6. She reportedly had been noncompliant with her medications. She had stopped taking her medication for a month due to depression. She is back on all of her medications. It was believed that her strokes were secondary to intracranial large vessel disease rather than cardiac source.   She was admitted to [facility] from 07/08/17 to 07/10/17 for slurred speech and right facial droop. MRI of brain showed small acute infarcts in the left hemisphere. Carotid doppler showed no hemodynamically significant stenosis. Echocardiogram showed normal EF with no evidence of PFO. LDL was 72. A1c was 7.3. She was advised to continue ASA and Plavix and Crestor 40mg . She was [facility] from 07/11/17 to 07/13/17 for another stroke. She presented with worsening dysarthria and new right facial and right upper extremity numbness. Dr. Pearlean BrownieSethi happened to be the hospital physician. CT head showed stable extensive chronic small vessel disease but no acute findings. MRI of brain showed increased size of acute infarct within left corona radiate and caudate body, interval infarction of the penumbra as well as stable scattered infarcts from recent stroke.Marland Kitchen. She was continued on dual antiplatelet therapy for 3 months, however she has only been on the Plavix alone.Due to dysphagia, she required a PEG insertion. While in rehab, she had syncopal episodes due to orthostatic hypotension. Her blood pressure fluctuates from 90s/60s to 150s systolic. She had an implantable loop recorder inserted.  Repeat modified barium swallow from 09/28/2017 revealed improved  swallowing function.  II VASCULAR DEMENTIA/APHASIA: She underwent neuropsychological testing on 03/10/16. She exhibited adjustment disorder with depression and anxiety. However, a cognitive diagnosis could not be made due to poor effort on testing. It was recommended to continue treating anxiety and depression.  III HEADACHES: Update: Intensity:  Severe Duration:  A hour or two Frequency:  Once a week Current NSAIDS: no Current analgesics: Ibuprofen, Tylenol Current triptans: no Current anti-emetic: no Current muscle relaxants: no Current anti-anxiolytic: no Current sleep aide: no Current Antihypertensive medications: lisinopril Current Antidepressant medications: escitalopram 10mg  Current Anticonvulsant medications: divalproex DR 250mg  twice daily Current Vitamins/Herbal/Supplements: Magnesium citrate 400mg  Current Antihistamines/Decongestants: no Other therapy: No  History: Onset:  July 2016.Sudden onset. Location:Right sided, from front to back of head.Initially had pain behind right ear. Quality:squeezing Initial intensity:7-8/10 Aura:no Prodrome:no Associated symptoms:Some photophobia and phonophobia.Sometimes nausea.She denies visual disturbance. Initial Duration:All day Initial Frequency:Daily.Sometimes wakes her up at night Triggers:  Loud noise Relieving factors:Laying in a cool dark quiet room  Past abortive medication:Tylenol, Excedrin, Percocet, ibuprofen 800mg  Past preventative medication:amitriptyline 25mg  Other past therapy:none  Sed Rate was 30. MRI of the brain from 11/09/14 showed chronic small vessel ischemic changes with an incidental tiny subacute infarct in the right frontal lobe.   Past Medical History: Past Medical History:  Diagnosis Date   Diabetes mellitus without complication (HCC)    Headache    Hyperlipidemia    Hypertension    Stroke [facility](HCC)    TIA (transient ischemic  attack)     Medications: Outpatient Encounter Medications as of 07/18/2018  Medication  Sig   clopidogrel (PLAVIX) 75 MG tablet Take 1 tablet (75 mg total) by mouth daily.   divalproex (DEPAKOTE) 250 MG DR tablet Take 1 tablet (250 mg total) by mouth 2 (two) times daily.   escitalopram (LEXAPRO) 10 MG tablet Take 1 tablet (10 mg total) by mouth daily.   insulin detemir (LEVEMIR) 100 UNIT/ML injection Inject 0.2 mLs (20 Units total) into the skin daily before breakfast. (Patient taking differently: Inject 15 Units into the skin daily before breakfast. )   lisinopril (PRINIVIL,ZESTRIL) 2.5 MG tablet Take 2.5 mg by mouth daily.   metoprolol tartrate (LOPRESSOR) 25 MG tablet Take 0.5 tablets (12.5 mg total) by mouth 2 (two) times daily.   polyethylene glycol (MIRALAX / GLYCOLAX) packet May take 17 g with 8 oz water per peg tube daily prn   rosuvastatin (CRESTOR) 40 MG tablet Take 1 tablet (40 mg total) by mouth at bedtime.   No facility-administered encounter medications on file as of 07/18/2018.     Allergies: Allergies  Allergen Reactions   Erythromycin Itching   Latex Itching    Family History: Family History  Problem Relation Age of Onset   Stroke Father        45s   Heart attack Father 82   COPD Mother    Heart failure Brother    Cancer Maternal Grandmother        unknown    COPD Other    Heart failure Maternal Grandfather    Hypertension Maternal Grandfather    Cancer Paternal Grandfather        lung   Diabetes Paternal Grandmother     Social History: Social History   Socioeconomic History   Marital status: Married    Spouse name: Not on file   Number of children: Not on file   Years of education: Not on file   Highest education level: Not on file  Occupational History   Occupation: Data processing manager  Social Needs   Financial resource strain: Not on file   Food insecurity:    Worry: Not on file    Inability: Not on file    Transportation needs:    Medical: Not on file    Non-medical: Not on file  Tobacco Use   Smoking status: Never Smoker   Smokeless tobacco: Never Used  Substance and Sexual Activity   Alcohol use: No    Alcohol/week: 0.0 standard drinks    Comment: rare    Drug use: No   Sexual activity: Not Currently  Lifestyle   Physical activity:    Days per week: Not on file    Minutes per session: Not on file   Stress: Not on file  Relationships   Social connections:    Talks on phone: Not on file    Gets together: Not on file    Attends religious service: Not on file    Active member of club or organization: Not on file    Attends meetings of clubs or organizations: Not on file    Relationship status: Not on file   Intimate partner violence:    Fear of current or ex partner: Not on file    Emotionally abused: Not on file    Physically abused: Not on file    Forced sexual activity: Not on file  Other Topics Concern   Not on file  Social History Narrative   Not on file   Observations/Objective:   There were no vitals taken for this visit. Alert and  oriented.  Speech slowed and slightly dysarthric.  Difficulty with some verbal output.  Flattening of right nasolabial fold.    Assessment and Plan:   1.  Recurrent strokes, cryptogenic: Residual cognitive impairment and right homonymous hemianopsia. She has uncontrolled diabetes, hyperlipidemia, hypertension and significant intracranial atherosclerotic disease which all could have contributed to her strokes. However, due to the multiple infarcts, I am wondering if further cardiac workup with loop recorder is needed.Other stroke workup has been unremarkable (including TTE/TEE, 30 day Holter monitor, carotid doppler and hypercoagulable panel). She had a DVT but no PFO. 2.  Vascular dementia 3.  Hypertension 4.  Orthostatic hypotension,  5.  Hyperlipidemia 6.  Migraine without aura, without status migrainosus, not  intractable 7.  Tension-type headaches, not intractable 8.  Depression, anxiety, pseudobulbar affect  1.  Continue Plavix for secondary stroke prevention 2.  Continue Crestor.  3.  Optimize glycemic control 4.  Optimize labile blood pressure control 5.  Continue divalproex DR 250mg  twice daily.   6.  Increase escitalopram to 20mg  daily 7.  Follow up in 6 months.  Follow Up Instructions:    -I discussed the assessment and treatment plan with the patient. The patient was provided an opportunity to ask questions and all were answered. The patient agreed with the plan and demonstrated an understanding of the instructions.   The patient was advised to call back or seek an in-person evaluation if the symptoms worsen or if the condition fails to improve as anticipated.   Cira Servant, DO

## 2018-07-18 ENCOUNTER — Other Ambulatory Visit: Payer: Self-pay

## 2018-07-18 ENCOUNTER — Telehealth (INDEPENDENT_AMBULATORY_CARE_PROVIDER_SITE_OTHER): Payer: Medicare Other | Admitting: Neurology

## 2018-07-18 ENCOUNTER — Telehealth: Payer: Self-pay | Admitting: Cardiology

## 2018-07-18 ENCOUNTER — Encounter: Payer: Self-pay | Admitting: Neurology

## 2018-07-18 DIAGNOSIS — F0150 Vascular dementia without behavioral disturbance: Secondary | ICD-10-CM | POA: Diagnosis not present

## 2018-07-18 DIAGNOSIS — F329 Major depressive disorder, single episode, unspecified: Secondary | ICD-10-CM

## 2018-07-18 DIAGNOSIS — I69351 Hemiplegia and hemiparesis following cerebral infarction affecting right dominant side: Secondary | ICD-10-CM | POA: Diagnosis not present

## 2018-07-18 DIAGNOSIS — E119 Type 2 diabetes mellitus without complications: Secondary | ICD-10-CM

## 2018-07-18 DIAGNOSIS — G43009 Migraine without aura, not intractable, without status migrainosus: Secondary | ICD-10-CM

## 2018-07-18 DIAGNOSIS — I69322 Dysarthria following cerebral infarction: Secondary | ICD-10-CM | POA: Diagnosis not present

## 2018-07-18 DIAGNOSIS — I639 Cerebral infarction, unspecified: Secondary | ICD-10-CM | POA: Diagnosis not present

## 2018-07-18 DIAGNOSIS — I951 Orthostatic hypotension: Secondary | ICD-10-CM

## 2018-07-18 DIAGNOSIS — F419 Anxiety disorder, unspecified: Secondary | ICD-10-CM

## 2018-07-18 DIAGNOSIS — I69320 Aphasia following cerebral infarction: Secondary | ICD-10-CM | POA: Diagnosis not present

## 2018-07-18 DIAGNOSIS — E1142 Type 2 diabetes mellitus with diabetic polyneuropathy: Secondary | ICD-10-CM | POA: Diagnosis not present

## 2018-07-18 MED ORDER — ESCITALOPRAM OXALATE 20 MG PO TABS: 20.0000 mg | ORAL_TABLET | Freq: Every day | ORAL | 5 refills | Status: AC

## 2018-07-18 NOTE — Telephone Encounter (Signed)
I can advise family again of your instructions and the PT. Will let PT know this is chronic.  Is there a certain amount of water or sodium intake to recommend?

## 2018-07-18 NOTE — Telephone Encounter (Signed)
This is a chronic complain and main reason why I see her.  Fluids, salt and she needs to do exercises !!  So, PT needs to work with her

## 2018-07-18 NOTE — Telephone Encounter (Signed)
Irving Burton with [facility] is requesting to speak to someone regarding patient's BP  Please call Irving Burton at [phone] to discuss

## 2018-07-18 NOTE — Telephone Encounter (Signed)
Returned phone call from [place] at [facility], left voice message.

## 2018-07-18 NOTE — Telephone Encounter (Signed)
Irving Burton, PT at [facility] calling to report orthostatis hypotension when seeing Kelly Romero for physical therapy visits. Irving Burton states patient's family checks supine BP's every day which run between 120's/70's to 150's/80's. However, when Kelly Romero gets out of bed her BP's drop dramatically. Today's readings were 120/74 supine, 80/60 sitting, 60/40 standing. These are typical readings. Unable to proceed with PT at this point.     Phoned daughter Kelly Romero, reconciled medications, patient is taking metoprolol 12.5 mg BID and Lisinopril 2.74m QD as ordered.   pls advise, tx

## 2018-07-19 DIAGNOSIS — F0150 Vascular dementia without behavioral disturbance: Secondary | ICD-10-CM | POA: Diagnosis not present

## 2018-07-19 DIAGNOSIS — I69322 Dysarthria following cerebral infarction: Secondary | ICD-10-CM | POA: Diagnosis not present

## 2018-07-19 DIAGNOSIS — I69351 Hemiplegia and hemiparesis following cerebral infarction affecting right dominant side: Secondary | ICD-10-CM | POA: Diagnosis not present

## 2018-07-19 DIAGNOSIS — I69320 Aphasia following cerebral infarction: Secondary | ICD-10-CM | POA: Diagnosis not present

## 2018-07-19 DIAGNOSIS — I951 Orthostatic hypotension: Secondary | ICD-10-CM | POA: Diagnosis not present

## 2018-07-19 DIAGNOSIS — E1142 Type 2 diabetes mellitus with diabetic polyneuropathy: Secondary | ICD-10-CM | POA: Diagnosis not present

## 2018-07-19 NOTE — Telephone Encounter (Signed)
She knows about that, we had multiple discussions about that. Sat least 2 l a day would be good

## 2018-07-20 DIAGNOSIS — I69322 Dysarthria following cerebral infarction: Secondary | ICD-10-CM | POA: Diagnosis not present

## 2018-07-20 DIAGNOSIS — E1142 Type 2 diabetes mellitus with diabetic polyneuropathy: Secondary | ICD-10-CM | POA: Diagnosis not present

## 2018-07-20 DIAGNOSIS — I69320 Aphasia following cerebral infarction: Secondary | ICD-10-CM | POA: Diagnosis not present

## 2018-07-20 DIAGNOSIS — I69351 Hemiplegia and hemiparesis following cerebral infarction affecting right dominant side: Secondary | ICD-10-CM | POA: Diagnosis not present

## 2018-07-20 DIAGNOSIS — F0150 Vascular dementia without behavioral disturbance: Secondary | ICD-10-CM | POA: Diagnosis not present

## 2018-07-20 DIAGNOSIS — I951 Orthostatic hypotension: Secondary | ICD-10-CM | POA: Diagnosis not present

## 2018-07-20 NOTE — Telephone Encounter (Signed)
Phoned patients daughter Aurther Loft to follow-up with Dr. Vanetta Shawl instructions,  no answer, left voice message.    Also phoned and spoke with PT Irving Burton with [facility] to inform of Dr. Vanetta Shawl orders for PT which is that patient needs to drink at least 2 liters of water a day and to push to do physical therapy.     Irving Burton states patient is severely depressed and cried almost every visit. She's been started on antidepressants which didn't seem to be working, so dose was recently increased. States that patient usually cries while she's there. States that water bottle is always within patient's reach but even with repeated prompting and encouragement pt will only drink a few sips of water. She believes patient does not want to bother family to get her up to the toilet. Estimates pt drinks only about       6 oz water a day. States she works patient hard in chair exercises and whenever she stands up to do walking, BP drops and she becomes woozy and unstable with profound hypotension.

## 2018-07-20 NOTE — Telephone Encounter (Signed)
2l Or more

## 2018-07-20 NOTE — Telephone Encounter (Signed)
To clarify, the recommendation is at least 2 liters of water a day?  tx

## 2018-07-21 DIAGNOSIS — F0150 Vascular dementia without behavioral disturbance: Secondary | ICD-10-CM | POA: Diagnosis not present

## 2018-07-21 DIAGNOSIS — I951 Orthostatic hypotension: Secondary | ICD-10-CM | POA: Diagnosis not present

## 2018-07-21 DIAGNOSIS — E1142 Type 2 diabetes mellitus with diabetic polyneuropathy: Secondary | ICD-10-CM | POA: Diagnosis not present

## 2018-07-21 DIAGNOSIS — I69320 Aphasia following cerebral infarction: Secondary | ICD-10-CM | POA: Diagnosis not present

## 2018-07-21 DIAGNOSIS — I69351 Hemiplegia and hemiparesis following cerebral infarction affecting right dominant side: Secondary | ICD-10-CM | POA: Diagnosis not present

## 2018-07-21 DIAGNOSIS — I69322 Dysarthria following cerebral infarction: Secondary | ICD-10-CM | POA: Diagnosis not present

## 2018-07-25 DIAGNOSIS — I69322 Dysarthria following cerebral infarction: Secondary | ICD-10-CM | POA: Diagnosis not present

## 2018-07-25 DIAGNOSIS — F0150 Vascular dementia without behavioral disturbance: Secondary | ICD-10-CM | POA: Diagnosis not present

## 2018-07-25 DIAGNOSIS — E1142 Type 2 diabetes mellitus with diabetic polyneuropathy: Secondary | ICD-10-CM | POA: Diagnosis not present

## 2018-07-25 DIAGNOSIS — I951 Orthostatic hypotension: Secondary | ICD-10-CM | POA: Diagnosis not present

## 2018-07-25 DIAGNOSIS — I69320 Aphasia following cerebral infarction: Secondary | ICD-10-CM | POA: Diagnosis not present

## 2018-07-25 DIAGNOSIS — I69351 Hemiplegia and hemiparesis following cerebral infarction affecting right dominant side: Secondary | ICD-10-CM | POA: Diagnosis not present

## 2018-07-26 DIAGNOSIS — F0150 Vascular dementia without behavioral disturbance: Secondary | ICD-10-CM | POA: Diagnosis not present

## 2018-07-26 DIAGNOSIS — I69351 Hemiplegia and hemiparesis following cerebral infarction affecting right dominant side: Secondary | ICD-10-CM | POA: Diagnosis not present

## 2018-07-26 DIAGNOSIS — E1142 Type 2 diabetes mellitus with diabetic polyneuropathy: Secondary | ICD-10-CM | POA: Diagnosis not present

## 2018-07-26 DIAGNOSIS — I69322 Dysarthria following cerebral infarction: Secondary | ICD-10-CM | POA: Diagnosis not present

## 2018-07-26 DIAGNOSIS — I951 Orthostatic hypotension: Secondary | ICD-10-CM | POA: Diagnosis not present

## 2018-07-26 DIAGNOSIS — I69320 Aphasia following cerebral infarction: Secondary | ICD-10-CM | POA: Diagnosis not present

## 2018-07-26 NOTE — Telephone Encounter (Signed)
Phoned and spoke with Aurther Loft, Chyrel's daughter. Informed that Physical Therapist has called [place] to report persistent hypotension that was making it nearly impossible to continue physical therapy when standing.  Informed that Dr.Krasowski recommends patient drink 2 liters of water a day and increase her sodium intake.  Daughter states patient will only drink water when physical therapist is there.  Told Terry to continue to push fluids, patient may be non-compliant due to not wanting to be helped or to bother caregivers to use bedpan and that there are no medications to substitute for hydration.  Daughter verbalizes understanding and has no further questions.

## 2018-07-27 DIAGNOSIS — I69351 Hemiplegia and hemiparesis following cerebral infarction affecting right dominant side: Secondary | ICD-10-CM | POA: Diagnosis not present

## 2018-07-27 DIAGNOSIS — I69322 Dysarthria following cerebral infarction: Secondary | ICD-10-CM | POA: Diagnosis not present

## 2018-07-27 DIAGNOSIS — I951 Orthostatic hypotension: Secondary | ICD-10-CM | POA: Diagnosis not present

## 2018-07-27 DIAGNOSIS — F0150 Vascular dementia without behavioral disturbance: Secondary | ICD-10-CM | POA: Diagnosis not present

## 2018-07-27 DIAGNOSIS — I69320 Aphasia following cerebral infarction: Secondary | ICD-10-CM | POA: Diagnosis not present

## 2018-07-27 DIAGNOSIS — E1142 Type 2 diabetes mellitus with diabetic polyneuropathy: Secondary | ICD-10-CM | POA: Diagnosis not present

## 2018-07-28 DIAGNOSIS — I951 Orthostatic hypotension: Secondary | ICD-10-CM | POA: Diagnosis not present

## 2018-07-28 DIAGNOSIS — F0150 Vascular dementia without behavioral disturbance: Secondary | ICD-10-CM | POA: Diagnosis not present

## 2018-07-28 DIAGNOSIS — I69320 Aphasia following cerebral infarction: Secondary | ICD-10-CM | POA: Diagnosis not present

## 2018-07-28 DIAGNOSIS — I69322 Dysarthria following cerebral infarction: Secondary | ICD-10-CM | POA: Diagnosis not present

## 2018-07-28 DIAGNOSIS — E1142 Type 2 diabetes mellitus with diabetic polyneuropathy: Secondary | ICD-10-CM | POA: Diagnosis not present

## 2018-07-28 DIAGNOSIS — I69351 Hemiplegia and hemiparesis following cerebral infarction affecting right dominant side: Secondary | ICD-10-CM | POA: Diagnosis not present

## 2018-08-02 DIAGNOSIS — F0150 Vascular dementia without behavioral disturbance: Secondary | ICD-10-CM | POA: Diagnosis not present

## 2018-08-02 DIAGNOSIS — I69320 Aphasia following cerebral infarction: Secondary | ICD-10-CM | POA: Diagnosis not present

## 2018-08-02 DIAGNOSIS — I951 Orthostatic hypotension: Secondary | ICD-10-CM | POA: Diagnosis not present

## 2018-08-02 DIAGNOSIS — I69322 Dysarthria following cerebral infarction: Secondary | ICD-10-CM | POA: Diagnosis not present

## 2018-08-02 DIAGNOSIS — E1142 Type 2 diabetes mellitus with diabetic polyneuropathy: Secondary | ICD-10-CM | POA: Diagnosis not present

## 2018-08-02 DIAGNOSIS — I69351 Hemiplegia and hemiparesis following cerebral infarction affecting right dominant side: Secondary | ICD-10-CM | POA: Diagnosis not present

## 2018-08-04 DIAGNOSIS — F0150 Vascular dementia without behavioral disturbance: Secondary | ICD-10-CM | POA: Diagnosis not present

## 2018-08-04 DIAGNOSIS — E1142 Type 2 diabetes mellitus with diabetic polyneuropathy: Secondary | ICD-10-CM | POA: Diagnosis not present

## 2018-08-04 DIAGNOSIS — I69320 Aphasia following cerebral infarction: Secondary | ICD-10-CM | POA: Diagnosis not present

## 2018-08-04 DIAGNOSIS — I69322 Dysarthria following cerebral infarction: Secondary | ICD-10-CM | POA: Diagnosis not present

## 2018-08-04 DIAGNOSIS — I69351 Hemiplegia and hemiparesis following cerebral infarction affecting right dominant side: Secondary | ICD-10-CM | POA: Diagnosis not present

## 2018-08-04 DIAGNOSIS — I951 Orthostatic hypotension: Secondary | ICD-10-CM | POA: Diagnosis not present

## 2018-08-05 DIAGNOSIS — F0150 Vascular dementia without behavioral disturbance: Secondary | ICD-10-CM | POA: Diagnosis not present

## 2018-08-05 DIAGNOSIS — I951 Orthostatic hypotension: Secondary | ICD-10-CM | POA: Diagnosis not present

## 2018-08-05 DIAGNOSIS — I69322 Dysarthria following cerebral infarction: Secondary | ICD-10-CM | POA: Diagnosis not present

## 2018-08-05 DIAGNOSIS — I69351 Hemiplegia and hemiparesis following cerebral infarction affecting right dominant side: Secondary | ICD-10-CM | POA: Diagnosis not present

## 2018-08-05 DIAGNOSIS — E1142 Type 2 diabetes mellitus with diabetic polyneuropathy: Secondary | ICD-10-CM | POA: Diagnosis not present

## 2018-08-05 DIAGNOSIS — I69320 Aphasia following cerebral infarction: Secondary | ICD-10-CM | POA: Diagnosis not present

## 2018-08-06 DIAGNOSIS — I951 Orthostatic hypotension: Secondary | ICD-10-CM | POA: Diagnosis not present

## 2018-08-06 DIAGNOSIS — Z7902 Long term (current) use of antithrombotics/antiplatelets: Secondary | ICD-10-CM | POA: Diagnosis not present

## 2018-08-06 DIAGNOSIS — I69320 Aphasia following cerebral infarction: Secondary | ICD-10-CM | POA: Diagnosis not present

## 2018-08-06 DIAGNOSIS — I69322 Dysarthria following cerebral infarction: Secondary | ICD-10-CM | POA: Diagnosis not present

## 2018-08-06 DIAGNOSIS — I69351 Hemiplegia and hemiparesis following cerebral infarction affecting right dominant side: Secondary | ICD-10-CM | POA: Diagnosis not present

## 2018-08-06 DIAGNOSIS — E1142 Type 2 diabetes mellitus with diabetic polyneuropathy: Secondary | ICD-10-CM | POA: Diagnosis not present

## 2018-08-06 DIAGNOSIS — F0150 Vascular dementia without behavioral disturbance: Secondary | ICD-10-CM | POA: Diagnosis not present

## 2018-08-06 DIAGNOSIS — Z794 Long term (current) use of insulin: Secondary | ICD-10-CM | POA: Diagnosis not present

## 2018-08-09 ENCOUNTER — Encounter: Payer: Medicare Other | Attending: Physical Medicine & Rehabilitation | Admitting: Physical Medicine & Rehabilitation

## 2018-08-09 ENCOUNTER — Other Ambulatory Visit: Payer: Self-pay

## 2018-08-09 VITALS — BP 82/75 | HR 79

## 2018-08-09 DIAGNOSIS — F0150 Vascular dementia without behavioral disturbance: Secondary | ICD-10-CM | POA: Diagnosis not present

## 2018-08-09 DIAGNOSIS — I69322 Dysarthria following cerebral infarction: Secondary | ICD-10-CM | POA: Diagnosis not present

## 2018-08-09 DIAGNOSIS — I69320 Aphasia following cerebral infarction: Secondary | ICD-10-CM | POA: Diagnosis not present

## 2018-08-09 DIAGNOSIS — I69391 Dysphagia following cerebral infarction: Secondary | ICD-10-CM | POA: Diagnosis not present

## 2018-08-09 DIAGNOSIS — I951 Orthostatic hypotension: Secondary | ICD-10-CM | POA: Insufficient documentation

## 2018-08-09 DIAGNOSIS — I69351 Hemiplegia and hemiparesis following cerebral infarction affecting right dominant side: Secondary | ICD-10-CM | POA: Insufficient documentation

## 2018-08-09 DIAGNOSIS — I69359 Hemiplegia and hemiparesis following cerebral infarction affecting unspecified side: Secondary | ICD-10-CM

## 2018-08-09 DIAGNOSIS — I10 Essential (primary) hypertension: Secondary | ICD-10-CM | POA: Insufficient documentation

## 2018-08-09 DIAGNOSIS — E1142 Type 2 diabetes mellitus with diabetic polyneuropathy: Secondary | ICD-10-CM | POA: Diagnosis not present

## 2018-08-09 DIAGNOSIS — I639 Cerebral infarction, unspecified: Secondary | ICD-10-CM | POA: Diagnosis not present

## 2018-08-09 DIAGNOSIS — E785 Hyperlipidemia, unspecified: Secondary | ICD-10-CM | POA: Insufficient documentation

## 2018-08-09 DIAGNOSIS — E119 Type 2 diabetes mellitus without complications: Secondary | ICD-10-CM | POA: Insufficient documentation

## 2018-08-09 NOTE — Progress Notes (Signed)
Subjective:  Purpose of phone visit is to allow continuity of care while reducing Covid 19 exposure - attempted to do Web Ex visit but pt's daughter could not connect  The first stroke occurred 12/07/2014 and was admitted to [facility]. She has some agitation at that time. However, CT scan was negative. Urine tox screen was positive for opioids. Patient states she was not taking opioids at that time, however, wonders whether her family may have given her some.  There were elevated blood pressures, hemoglobin A 1 C, was elevated at 12, no fever. TIA was suspected. Had another strokelike symptom 02/22/2015 working up with a headache and slurred speech and went to [facility] MRI showed left occipital subacute infarct. Workup including Dopplers, 2-D echo, hypercoagulable panel was all negative. Plavix was added to aspirin TEE performed to 06/03/2015. Normal ejection fracture. No cardiac source of emboli  Admission to [facility] 08/15/2015 - 6/07-2015 for right-sided weakness as well as forgetfulness. No TPA was administered. Urine tox negative CTs head showed no acute findings. MRI showed small acute infarct posterior right MCA territory although "some question this may have been an old infarct EEG showed no epileptiform activity. TTE unremarkable. Some question whether there was some psychologic component to this.  Another admission for fever in October 2017 MRI showed small acute and subacute infarcts bilateral cerebral hemisphere. Urinalysis and tox screen negative, workup for endocarditis is negative, LP. It was stenosis at the PCA P1 segment, lower extremity distal DVT on the right not placed on chronic anticoagulation multiple CVAs most recent at [facility] on 04/2017 and [facility] with discharge on 07/10/17 after L- CVA. She was readmitted on 07/11/17 with worsening of slurred speech and right sided weakness. MRI brain showed few new strokes in parasagittal cortical and subcortical left frontal  lobe. Work up was negative for vasculitis, hypercoagulopathy and Dr. Pearlean Brownie felt tha stroke due to advanced intracranial atherosclerosis  Patient ID: Kelly Romero, female    DOB: [birth year], 53 y.o.   MRN: [id number] Reviewed note from Neuro HPI PEG removed by IR Eating ok Drinking fair  Home health has been coming out.  Has one more visit Then planning to go to skilled nursing facility- pt is apprehensive about this, "I hope they treat me well"   No recent falls Pt states she feel like a UTI is coming on  Complaints of Headache evaluated by Neuro  Pain Inventory Average Pain 0 Pain Right Now 0 My pain is na  In the last 24 hours, has pain interfered with the following? General activity 0 Relation with others 0 Enjoyment of life 0 What TIME of day is your pain at its worst? na Sleep (in general) Fair  Pain is worse with: na Pain improves with: na Relief from Meds: na  Mobility use a wheelchair needs help with transfers  Function disabled: date disabled na I need assistance with the following:  dressing, bathing, toileting, meal prep, household duties and shopping  Neuro/Psych bladder control problems  Prior Studies Any changes since last visit?  no  Physicians involved in your care Any changes since last visit?  no   Family History  Problem Relation Age of Onset   Stroke Father        16s   Heart attack Father 42   COPD Mother    Heart failure Brother    Cancer Maternal Grandmother        unknown    COPD Other    Heart  failure Maternal Grandfather    Hypertension Maternal Grandfather    Cancer Paternal Grandfather        lung   Diabetes Paternal Grandmother    Social History   Socioeconomic History   Marital status: Married    Spouse name: Not on file   Number of children: Not on file   Years of education: Not on file   Highest education level: Not on file  Occupational History   Occupation: Data processing managerAssistant Teacher  Social Needs     Financial resource strain: Not on file   Food insecurity:    Worry: Not on file    Inability: Not on file   Transportation needs:    Medical: Not on file    Non-medical: Not on file  Tobacco Use   Smoking status: Never Smoker   Smokeless tobacco: Never Used  Substance and Sexual Activity   Alcohol use: No    Alcohol/week: 0.0 standard drinks    Comment: rare    Drug use: No   Sexual activity: Not Currently  Lifestyle   Physical activity:    Days per week: Not on file    Minutes per session: Not on file   Stress: Not on file  Relationships   Social connections:    Talks on phone: Not on file    Gets together: Not on file    Attends religious service: Not on file    Active member of club or organization: Not on file    Attends meetings of clubs or organizations: Not on file    Relationship status: Not on file  Other Topics Concern   Not on file  Social History Narrative   Not on file   Past Surgical History:  Procedure Laterality Date   APPENDECTOMY     CHOLECYSTECTOMY     FOOT GANGLION EXCISION     HERNIA REPAIR     IR GASTROSTOMY TUBE MOD SED  07/30/2017   IR GASTROSTOMY TUBE REMOVAL  07/15/2018   IR GENERIC HISTORICAL  12/30/2015   IR ANGIO INTRA EXTRACRAN SEL COM CAROTID INNOMINATE BILAT MOD SED 12/30/2015 Julieanne CottonSanjeev Deveshwar, MD [facility]   IR GENERIC HISTORICAL  12/30/2015   IR ANGIO VERTEBRAL SEL VERTEBRAL BILAT MOD SED 12/30/2015 Julieanne CottonSanjeev Deveshwar, MD [facility]   LOOP RECORDER INSERTION N/A 07/13/2017   Procedure: LOOP RECORDER INSERTION;  Surgeon: Hillis RangeAllred, James, MD;  [facility];  Service: Cardiovascular;  Laterality: N/A;   TEE WITHOUT CARDIOVERSION N/A 04/19/2015   Procedure: TRANSESOPHAGEAL ECHOCARDIOGRAM (TEE);  Surgeon: Jake BatheMark C Skains, MD;  [facility];  Service: Cardiovascular;  Laterality: N/A;   VAGINAL HYSTERECTOMY     Past Medical History:  Diagnosis Date   Diabetes mellitus without  complication (HCC)    Headache    Hyperlipidemia    Hypertension    Stroke (HCC)    TIA (transient ischemic attack)    BP (!) 82/75 Comment: pt reported virtual visit   Pulse 79 Comment: pt reported virtual visit  Opioid Risk Score:   Fall Risk Score:  `1  Depression screen PHQ 2/9  Depression screen PHQ 2/9 04/10/2016  Decreased Interest 2  Down, Depressed, Hopeless 2  PHQ - 2 Score 4  Altered sleeping 3  Tired, decreased energy 3  Change in appetite 0  Feeling bad or failure about yourself  1  Trouble concentrating 3  Moving slowly or fidgety/restless 3  Suicidal thoughts 1  PHQ-9 Score 18  Difficult doing work/chores Extremely dIfficult  Review of Systems  Constitutional: Negative.   HENT: Negative.   Eyes: Negative.   Respiratory: Negative.   Cardiovascular: Negative.   Gastrointestinal: Negative.   Endocrine: Negative.   Genitourinary: Positive for difficulty urinating and dysuria.  Musculoskeletal: Negative.   Skin: Negative.   Allergic/Immunologic: Negative.   Neurological: Negative.   Hematological: Negative.   Psychiatric/Behavioral: Negative.   All other systems reviewed and are negative.      Objective:   Physical Exam Speech dysarthric Poor historian, does not recall that she has had several strokes, was surprised Mood and affect without lability or agitation Remainder of exam limited by phone visit       Assessment & Plan:  1.  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  Rehab efforts are limited by orthostatic hypotension  Completing HH therapy and planning SNF placement due to burden of care   2.  Vascular HA managed by Neuro, f/u with Dr Everlena Cooper  3.  Dysuria, pt/family  instructed to call PCP about this  PMR f/u on prn basis

## 2018-08-09 NOTE — Patient Instructions (Addendum)
Please call primary MD regarding urinary symptoms

## 2018-08-10 ENCOUNTER — Ambulatory Visit (INDEPENDENT_AMBULATORY_CARE_PROVIDER_SITE_OTHER): Payer: Medicare Other | Admitting: *Deleted

## 2018-08-10 DIAGNOSIS — I639 Cerebral infarction, unspecified: Secondary | ICD-10-CM | POA: Diagnosis not present

## 2018-08-11 DIAGNOSIS — I69322 Dysarthria following cerebral infarction: Secondary | ICD-10-CM | POA: Diagnosis not present

## 2018-08-11 DIAGNOSIS — I69320 Aphasia following cerebral infarction: Secondary | ICD-10-CM | POA: Diagnosis not present

## 2018-08-11 DIAGNOSIS — E1142 Type 2 diabetes mellitus with diabetic polyneuropathy: Secondary | ICD-10-CM | POA: Diagnosis not present

## 2018-08-11 DIAGNOSIS — F0150 Vascular dementia without behavioral disturbance: Secondary | ICD-10-CM | POA: Diagnosis not present

## 2018-08-11 DIAGNOSIS — I69351 Hemiplegia and hemiparesis following cerebral infarction affecting right dominant side: Secondary | ICD-10-CM | POA: Diagnosis not present

## 2018-08-11 DIAGNOSIS — I951 Orthostatic hypotension: Secondary | ICD-10-CM | POA: Diagnosis not present

## 2018-08-11 LAB — CUP PACEART REMOTE DEVICE CHECK
Date Time Interrogation Session: 20200528000931
Implantable Pulse Generator Implant Date: 20190430

## 2018-08-19 NOTE — Progress Notes (Signed)
Carelink Summary Report / Loop Recorder 

## 2018-08-22 ENCOUNTER — Telehealth: Payer: Self-pay | Admitting: Student

## 2018-08-22 NOTE — Telephone Encounter (Signed)
Received call back from patient's daughter Kelly Romero and after a lengthy discussion regarding Palliative services she decided to decline services at this time.  She stated that she was needing to place hier Mom into SNF but having trouble due to the [place].  I did tell her that we have a Education officer, museum on the Palliative Team that could maybe help and she stated that she already has a SW.  So I explained to her that we would cancel the referral and that if she changes her mind and feels like they need Palliative services to please contact her Mom's PCP and request Palliative services again.  She was in agreement with this.

## 2018-08-22 NOTE — Telephone Encounter (Signed)
Called patient's daughter [name] to schedule Palliative consult, no answer.  Left message with reason for call along with my call back number

## 2018-08-30 ENCOUNTER — Telehealth (INDEPENDENT_AMBULATORY_CARE_PROVIDER_SITE_OTHER): Payer: Medicare Other | Admitting: Cardiology

## 2018-08-30 ENCOUNTER — Encounter: Payer: Self-pay | Admitting: Cardiology

## 2018-08-30 ENCOUNTER — Emergency Department: Payer: Medicare Other

## 2018-08-30 ENCOUNTER — Other Ambulatory Visit: Payer: Self-pay

## 2018-08-30 ENCOUNTER — Emergency Department
Admission: EM | Admit: 2018-08-30 | Discharge: 2018-09-01 | Disposition: A | Discharge status: Skilled Nursing Facility | Payer: Medicare Other | Attending: Emergency Medicine | Admitting: Emergency Medicine

## 2018-08-30 VITALS — BP 150/79 | HR 66

## 2018-08-30 DIAGNOSIS — Z591 Inadequate housing: Secondary | ICD-10-CM | POA: Insufficient documentation

## 2018-08-30 DIAGNOSIS — Z91040 Latex allergy status: Secondary | ICD-10-CM | POA: Insufficient documentation

## 2018-08-30 DIAGNOSIS — Z8673 Personal history of transient ischemic attack (TIA), and cerebral infarction without residual deficits: Secondary | ICD-10-CM | POA: Diagnosis not present

## 2018-08-30 DIAGNOSIS — R1310 Dysphagia, unspecified: Secondary | ICD-10-CM | POA: Insufficient documentation

## 2018-08-30 DIAGNOSIS — Z7901 Long term (current) use of anticoagulants: Secondary | ICD-10-CM | POA: Insufficient documentation

## 2018-08-30 DIAGNOSIS — E1169 Type 2 diabetes mellitus with other specified complication: Secondary | ICD-10-CM

## 2018-08-30 DIAGNOSIS — I10 Essential (primary) hypertension: Secondary | ICD-10-CM | POA: Diagnosis not present

## 2018-08-30 DIAGNOSIS — N390 Urinary tract infection, site not specified: Secondary | ICD-10-CM | POA: Diagnosis not present

## 2018-08-30 DIAGNOSIS — E119 Type 2 diabetes mellitus without complications: Secondary | ICD-10-CM | POA: Diagnosis not present

## 2018-08-30 DIAGNOSIS — Z79899 Other long term (current) drug therapy: Secondary | ICD-10-CM | POA: Diagnosis not present

## 2018-08-30 DIAGNOSIS — Z20828 Contact with and (suspected) exposure to other viral communicable diseases: Secondary | ICD-10-CM | POA: Insufficient documentation

## 2018-08-30 DIAGNOSIS — R51 Headache: Secondary | ICD-10-CM | POA: Diagnosis not present

## 2018-08-30 DIAGNOSIS — I951 Orthostatic hypotension: Secondary | ICD-10-CM

## 2018-08-30 DIAGNOSIS — Z598 Other problems related to housing and economic circumstances: Secondary | ICD-10-CM | POA: Diagnosis not present

## 2018-08-30 DIAGNOSIS — F32A Depression, unspecified: Secondary | ICD-10-CM

## 2018-08-30 DIAGNOSIS — F329 Major depressive disorder, single episode, unspecified: Secondary | ICD-10-CM

## 2018-08-30 DIAGNOSIS — R531 Weakness: Secondary | ICD-10-CM | POA: Diagnosis not present

## 2018-08-30 LAB — URINALYSIS, COMPLETE (UACMP) WITH MICROSCOPIC
Bilirubin Urine: NEGATIVE
Glucose, UA: NEGATIVE mg/dL
Ketones, ur: NEGATIVE mg/dL
Nitrite: NEGATIVE
Protein, ur: 100 mg/dL — AB
Specific Gravity, Urine: 1.013 (ref 1.005–1.030)
pH: 5 (ref 5.0–8.0)

## 2018-08-30 LAB — CBC WITH DIFFERENTIAL/PLATELET
Abs Immature Granulocytes: 0.02 10*3/uL (ref 0.00–0.07)
Basophils Absolute: 0 10*3/uL (ref 0.0–0.1)
Basophils Relative: 0 %
Eosinophils Absolute: 0.3 10*3/uL (ref 0.0–0.5)
Eosinophils Relative: 6 %
HCT: 35 % — ABNORMAL LOW (ref 36.0–46.0)
Hemoglobin: 11.2 g/dL — ABNORMAL LOW (ref 12.0–15.0)
Immature Granulocytes: 0 %
Lymphocytes Relative: 36 %
Lymphs Abs: 2 10*3/uL (ref 0.7–4.0)
MCH: 28.5 pg (ref 26.0–34.0)
MCHC: 32 g/dL (ref 30.0–36.0)
MCV: 89.1 fL (ref 80.0–100.0)
Monocytes Absolute: 0.3 10*3/uL (ref 0.1–1.0)
Monocytes Relative: 6 %
Neutro Abs: 2.9 10*3/uL (ref 1.7–7.7)
Neutrophils Relative %: 52 %
Platelets: 221 10*3/uL (ref 150–400)
RBC: 3.93 MIL/uL (ref 3.87–5.11)
RDW: 12.5 % (ref 11.5–15.5)
WBC: 5.5 10*3/uL (ref 4.0–10.5)
nRBC: 0 % (ref 0.0–0.2)

## 2018-08-30 LAB — BASIC METABOLIC PANEL
Anion gap: 11 (ref 5–15)
BUN: 29 mg/dL — ABNORMAL HIGH (ref 6–20)
CO2: 27 mmol/L (ref 22–32)
Calcium: 9.7 mg/dL (ref 8.9–10.3)
Chloride: 99 mmol/L (ref 98–111)
Creatinine, Ser: 0.98 mg/dL (ref 0.44–1.00)
GFR calc Af Amer: >60 mL/min (ref >60)
GFR calc non Af Amer: >60 mL/min (ref >60)
Glucose, Bld: 149 mg/dL — ABNORMAL HIGH (ref 70–99)
Potassium: 4.2 mmol/L (ref 3.5–5.1)
Sodium: 137 mmol/L (ref 135–145)

## 2018-08-30 LAB — LACTIC ACID, PLASMA: Lactic Acid, Venous: 1 mmol/L (ref 0.5–1.9)

## 2018-08-30 MED ORDER — PROCHLORPERAZINE EDISYLATE 10 MG/2ML IJ SOLN
10.0000 mg | Freq: Once | INTRAMUSCULAR | Status: AC
  Administered 2018-08-30: 10 mg via INTRAVENOUS
  Filled 2018-08-30: qty 2

## 2018-08-30 MED ORDER — BUTALBITAL-APAP-CAFFEINE 50-325-40 MG PO TABS
1.0000 | ORAL_TABLET | Freq: Once | ORAL | Status: DC
  Filled 2018-08-30: qty 1

## 2018-08-30 MED ORDER — CEPHALEXIN 500 MG PO CAPS: 500.0000 mg | ORAL_CAPSULE | Freq: Three times a day (TID) | ORAL | 0 refills | Status: DC

## 2018-08-30 MED ORDER — SODIUM CHLORIDE 0.9 % IV SOLN
1.0000 g | Freq: Once | INTRAVENOUS | Status: AC
  Administered 2018-08-30: 1 g via INTRAVENOUS
  Filled 2018-08-30: qty 10

## 2018-08-30 MED ORDER — SODIUM CHLORIDE 0.9 % IV BOLUS
1000.0000 mL | Freq: Once | INTRAVENOUS | Status: AC
  Administered 2018-08-30: 1000 mL via INTRAVENOUS

## 2018-08-30 NOTE — ED Notes (Signed)
Pt transferred to hospital bed

## 2018-08-30 NOTE — ED Notes (Signed)
Pt sleeping at this time. Equal rise and fall of chest noted. 

## 2018-08-30 NOTE — ED Notes (Signed)
ED TO INPATIENT HANDOFF REPORT  ED Nurse Name and Phone #:  Handy Mcloud [phone]  S Name/Age/Gender Kelly KhanPamela R Romero 53 y.o. female Room/Bed: ED05A/ED05A  Code StatReuel Boomus   Code Status: Prior  Home/SNF/Other Home Patient oriented to: self and place Is this baseline? Yes   Triage Complete: Triage complete  Chief Complaint Headache  Triage Note Patient has had a headache since 0900hrs. Patient has had a stroke with right sided weakness in the past. Patient's speech is clear during triage. Patient lives with daughter.   Allergies Allergies  Allergen Reactions  . Erythromycin Itching  . Latex Itching    Level of Care/Admitting Diagnosis ED Disposition    None      B Medical/Surgery History Past Medical History:  Diagnosis Date  . Diabetes mellitus without complication (HCC)   . Headache   . Hyperlipidemia   . Hypertension   . Stroke (HCC)   . TIA (transient ischemic attack)    Past Surgical History:  Procedure Laterality Date  . APPENDECTOMY    . CHOLECYSTECTOMY    . FOOT GANGLION EXCISION    . HERNIA REPAIR    . IR GASTROSTOMY TUBE MOD SED  07/30/2017  . IR GASTROSTOMY TUBE REMOVAL  07/15/2018  . IR GENERIC HISTORICAL  12/30/2015   IR ANGIO INTRA EXTRACRAN SEL COM CAROTID INNOMINATE BILAT MOD SED 12/30/2015 Julieanne CottonSanjeev Deveshwar, MD [facility]  . IR GENERIC HISTORICAL  12/30/2015   IR ANGIO VERTEBRAL SEL VERTEBRAL BILAT MOD SED 12/30/2015 Julieanne CottonSanjeev Deveshwar, MD [facility]  . LOOP RECORDER INSERTION N/A 07/13/2017   Procedure: LOOP RECORDER INSERTION;  Surgeon: Hillis RangeAllred, James, MD;  [facility];  Service: Cardiovascular;  Laterality: N/A;  . TEE WITHOUT CARDIOVERSION N/A 04/19/2015   Procedure: TRANSESOPHAGEAL ECHOCARDIOGRAM (TEE);  Surgeon: Jake BatheMark C Skains, MD;  [facility];  Service: Cardiovascular;  Laterality: N/A;  . VAGINAL HYSTERECTOMY       A IV Location/Drains/Wounds Patient Lines/Drains/Airways Status   Active  Line/Drains/Airways    Name:   Placement date:   Placement time:   Site:   Days:   Gastrostomy/Enterostomy Gastrostomy 20 Fr. LUQ   07/30/17    1300    LUQ   396          Intake/Output Last 24 hours No intake or output data in the 24 hours ending 08/30/18 1847  Labs/Imaging No results found for this or any previous visit (from the past 48 hour(s)). Ct Head Wo Contrast  Result Date: 08/30/2018 CLINICAL DATA:  Headache since earlier today. History of stroke with RIGHT-sided weakness. EXAM: CT HEAD WITHOUT CONTRAST TECHNIQUE: Contiguous axial images were obtained from the base of the skull through the vertex without intravenous contrast. COMPARISON:  CT head 07/31/2017.  MR head 08/03/2017. FINDINGS: Brain: No evidence for acute infarction, hemorrhage, mass lesion, hydrocephalus, or extra-axial fluid. Generalized atrophy, markedly premature for age. Old LEFT-sided infarcts affect the parieto-occipital and periventricular regions. Small lacunar infarcts affect both basal ganglia. Ex vacuo ventricular enlargement on the LEFT. Chronic microvascular ischemic change. Vascular: Calcification of the cavernous internal carotid arteries consistent with cerebrovascular atherosclerotic disease. No signs of intracranial large vessel occlusion. Skull: Calvarium intact.  Hyperostosis. Sinuses/Orbits: No acute sinus disease.  Negative orbits. Other: None. IMPRESSION: Atrophy and small vessel disease. No acute intracranial findings. Old LEFT-sided infarcts. Electronically Signed   By: Elsie StainJohn T Curnes M.D.   On: 08/30/2018 16:53    Pending Labs Unresulted Labs (From admission, onward)   None  Vitals/Pain Today's Vitals   08/30/18 1630 08/30/18 1700 08/30/18 1730 08/30/18 1808  BP: (!) 168/75 (!) 157/86 (!) 166/84   Pulse: 66 68 71   Resp:   18   Temp:      SpO2: 99% 100% 98%   Weight:      Height:      PainSc:    10-Worst pain ever    Isolation Precautions No active  isolations  Medications Medications  butalbital-acetaminophen-caffeine (FIORICET) 50-325-40 MG per tablet 1 tablet (1 tablet Oral Not Given 08/30/18 1709)  prochlorperazine (COMPAZINE) injection 10 mg (10 mg Intravenous Given 08/30/18 1814)    Mobility walks with device High fall risk   Focused Assessments Cardiac Assessment Handoff:    Lab Results  Component Value Date   CKTOTAL 202 12/26/2015   TROPONINI <0.03 06/28/2018   No results found for: DDIMER Does the Patient currently have chest pain? No     R Recommendations: See Admitting Provider Note  Report given to:   Additional Notes:

## 2018-08-30 NOTE — Progress Notes (Signed)
Virtual Visit via Video Note   This visit type was conducted due to national recommendations for restrictions regarding the COVID-19 Pandemic (e.g. social distancing) in an effort to limit this patient's exposure and mitigate transmission in our community.  Due to her co-morbid illnesses, this patient is at least at moderate risk for complications without adequate follow up.  This format is felt to be most appropriate for this patient at this time.  All issues noted in this document were discussed and addressed.  A limited physical exam was performed with this format.  Please refer to the patient's chart for her consent to telehealth for [facility].  Evaluation Performed:  Follow-up visit  This visit type was conducted due to national recommendations for restrictions regarding the COVID-19 Pandemic (e.g. social distancing).  This format is felt to be most appropriate for this patient at this time.  All issues noted in this document were discussed and addressed.  No physical exam was performed (except for noted visual exam findings with Video Visits).  Please refer to the patient's chart (MyChart message for video visits and phone note for telephone visits) for the patient's consent to telehealth for [facility].  Date:  08/30/2018  ID: Kelly Romero, DOB 10-18-1965, MRN [id number]   Patient Location: [address] [place] [address]   Provider location:   [place] Office  PCP:  Angelina Sheriff, MD  Cardiologist:  Jenne Campus, MD     Chief Complaint: Not doing well  History of Present Illness:    Kelly Romero is a 53 y.o. female  who presents via audio/video conferencing for a telehealth visit today.  With recurrent CVA, she does have implantable loop recorder that so far did not show any atrial fibrillation, she does have problem with orthostatic hypotension, diabetes, hyper lipidemia.  So far were trying to manage the problem with orthostatic  hypotension with fluid intake as well as salt intake limited success at the same time in supine she got very high blood pressure.  Therefore addition of Midodrin could be even dangerous.  We spent a great deal of time talking to her today about her problem she did have physical therapy that was coming from home but because of her unwillingness to cooperate and lack of progress that being discontinued.  Eventually family is looking at potentially placing her to nursing home hopefully in this more organized environment she will get more help and she will gain strength she is very happy about this and she cries to me over the phone.  I think a significant component of depression here as well. Cardiac wise denies have any chest pain tightness squeezing pressure burning chest   The patient does not have symptoms concerning for COVID-19 infection (fever, chills, cough, or new SHORTNESS OF BREATH).    Prior CV studies:   The following studies were reviewed today:       Past Medical History:  Diagnosis Date  . Diabetes mellitus without complication ([place])   . Headache   . Hyperlipidemia   . Hypertension   . Stroke ([place])   . TIA (transient ischemic attack)     Past Surgical History:  Procedure Laterality Date  . APPENDECTOMY    . CHOLECYSTECTOMY    . FOOT GANGLION EXCISION    . HERNIA REPAIR    . IR GASTROSTOMY TUBE MOD SED  07/30/2017  . IR GASTROSTOMY TUBE REMOVAL  07/15/2018  . IR GENERIC HISTORICAL  12/30/2015  IR ANGIO INTRA EXTRACRAN SEL COM CAROTID INNOMINATE BILAT MOD SED 12/30/2015 Julieanne CottonSanjeev Deveshwar, MD [facility]  . IR GENERIC HISTORICAL  12/30/2015   IR ANGIO VERTEBRAL SEL VERTEBRAL BILAT MOD SED 12/30/2015 Julieanne CottonSanjeev Deveshwar, MD [facility]  . LOOP RECORDER INSERTION N/A 07/13/2017   Procedure: LOOP RECORDER INSERTION;  Surgeon: Hillis RangeAllred, James, MD;  [facility];  Service: Cardiovascular;  Laterality: N/A;  . TEE WITHOUT CARDIOVERSION N/A 04/19/2015    Procedure: TRANSESOPHAGEAL ECHOCARDIOGRAM (TEE);  Surgeon: Jake BatheMark C Skains, MD;  [facility];  Service: Cardiovascular;  Laterality: N/A;  . VAGINAL HYSTERECTOMY       Current Meds  Medication Sig  . clopidogrel (PLAVIX) 75 MG tablet Take 1 tablet (75 mg total) by mouth daily.  . divalproex (DEPAKOTE) 250 MG DR tablet Take 1 tablet (250 mg total) by mouth 2 (two) times daily.  Marland Kitchen. escitalopram (LEXAPRO) 20 MG tablet Take 1 tablet (20 mg total) by mouth daily.  . insulin detemir (LEVEMIR) 100 UNIT/ML injection Inject 0.2 mLs (20 Units total) into the skin daily before breakfast. (Patient taking differently: Inject 15 Units into the skin daily before breakfast. )  . lisinopril (PRINIVIL,ZESTRIL) 2.5 MG tablet Take 2.5 mg by mouth daily.  . metoprolol tartrate (LOPRESSOR) 25 MG tablet Take 0.5 tablets (12.5 mg total) by mouth 2 (two) times daily.  . polyethylene glycol (MIRALAX / GLYCOLAX) packet May take 17 g with 8 oz water per peg tube daily prn  . rosuvastatin (CRESTOR) 40 MG tablet Take 1 tablet (40 mg total) by mouth at bedtime.      Family History: [name] family history includes COPD in her mother and another family member; Cancer in her maternal grandmother and paternal grandfather; Diabetes in her paternal grandmother; Heart attack (age of onset: 7063) in her father; Heart failure in her brother and maternal grandfather; Hypertension in her maternal grandfather; Stroke in her father.   ROS:   Please see the history of present illness.     All other systems reviewed and are negative.   Labs/Other Tests and Data Reviewed:     Recent Labs: 01/07/2018: ALT 8 06/28/2018: BUN 24; Creatinine, Ser 0.83; Hemoglobin 9.7; Platelets 201; Potassium 3.7; Sodium 138  Recent Lipid Panel    Component Value Date/Time   CHOL 125 07/09/2017 0633   CHOL 329 (H) 11/06/2016 1040   TRIG 99 07/09/2017 0633   HDL 33 (L) 07/09/2017 0633   HDL 50 11/06/2016 1040   CHOLHDL 3.8  07/09/2017 0633   VLDL 20 07/09/2017 0633   LDLCALC 72 07/09/2017 0633   LDLCALC 229 (H) 11/06/2016 1040      Exam:    Vital Signs:  BP (!) 150/79   Pulse 66     Wt Readings from Last 3 Encounters:  06/28/18 189 lb (85.7 kg)  04/19/18 175 lb (79.4 kg)  03/24/18 175 lb (79.4 kg)     Well nourished, well developed in no acute distress. Alert awake and attentive we will do video link.  She is crying during the visit she is laying in the bed like always.  Diagnosis for this visit:   1. Essential hypertension   2. Orthostatic hypotension   3. Diabetes mellitus type 2 in obese (HCC)   4. Anxiety and depression      ASSESSMENT & PLAN:    1.  Essential hypertension I will not change any of her medications right now we do have elevated blood pressure when she is lying  down but we do have drop in the blood pressure when she is getting up orthostatic hypotension which is difficult to control for the reason as mentioned above.  The hope is if she will go to rehab place will be able to build up her stamina she will be able to stand up and function a little bit better.  She will need organized physical therapy treatment. 2.  Diabetes seems to be stable. 3.  Anxiety and depression.  I think she need to see therapist to help with her problems.  I will communicate this to her primary care physician.  COVID-19 Education: The signs and symptoms of COVID-19 were discussed with the patient and how to seek care for testing (follow up with PCP or arrange E-visit).  The importance of social distancing was discussed today.  Patient Risk:   After full review of this patients clinical status, I feel that they are at least moderate risk at this time.  Time:   Today, I have spent 18 minutes with the patient with telehealth technology discussing pt health issues.  I spent 5 minutes reviewing her chart before the visit.  Visit was finished at 9:23 AM.    Medication Adjustments/Labs and Tests  Ordered: Current medicines are reviewed at length with the patient today.  Concerns regarding medicines are outlined above.  No orders of the defined types were placed in this encounter.  Medication changes: No orders of the defined types were placed in this encounter.    Disposition: Follow-up 3 months  Signed, Georgeanna Leaobert J. , MD, [facility] 08/30/2018 9:22 AM    [facility]

## 2018-08-30 NOTE — ED Triage Notes (Addendum)
Patient has had a headache since 0900hrs. Patient has had a stroke with right sided weakness in the past. Patient's speech is clear during triage. Patient lives with daughter.
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
## 2018-08-30 NOTE — Patient Instructions (Signed)
Medication Instructions:  Your physician recommends that you continue on your current medications as directed. Please refer to the Current Medication list given to you today.  If you need a refill on your cardiac medications before your next appointment, please call your pharmacy.   Lab work: None.  If you have labs (blood work) drawn today and your tests are completely normal, you will receive your results only by: Marland Kitchen MyChart Message (if you have MyChart) OR . A paper copy in the mail If you have any lab test that is abnormal or we need to change your treatment, we will call you to review the results.  Testing/Procedures: None.   Follow-Up: At [facility], you and your health needs are our priority.  As part of our continuing mission to provide you with exceptional heart care, we have created designated Provider Care Teams.  These Care Teams include your primary Cardiologist (physician) and Advanced Practice Providers (APPs -  Physician Assistants and Nurse Practitioners) who all work together to provide you with the care you need, when you need it. You will need a follow up appointment in 3 months.  Please call our office 2 months in advance to schedule this appointment.  You may see Quay Burow, MD or another member of our [place] Provider Team in [place]: Shirlee More, MD . Jyl Heinz, MD  Any Other Special Instructions Will Be Listed Below (If Applicable).

## 2018-08-31 DIAGNOSIS — N390 Urinary tract infection, site not specified: Secondary | ICD-10-CM | POA: Diagnosis not present

## 2018-08-31 LAB — GLUCOSE, CAPILLARY
Glucose-Capillary: 118 mg/dL — ABNORMAL HIGH (ref 70–99)
Glucose-Capillary: 134 mg/dL — ABNORMAL HIGH (ref 70–99)

## 2018-08-31 LAB — SARS CORONAVIRUS 2 BY RT PCR (HOSPITAL ORDER, PERFORMED IN ~~LOC~~ HOSPITAL LAB): SARS Coronavirus 2: NEGATIVE

## 2018-08-31 MED ORDER — METOPROLOL TARTRATE 25 MG PO TABS
12.5000 mg | ORAL_TABLET | Freq: Two times a day (BID) | ORAL | Status: DC
  Administered 2018-08-31 – 2018-09-01 (×2): 12.5 mg via ORAL
  Filled 2018-08-31 (×2): qty 1

## 2018-08-31 MED ORDER — LISINOPRIL 5 MG PO TABS
5.0000 mg | ORAL_TABLET | Freq: Every day | ORAL | Status: DC
  Administered 2018-08-31 – 2018-09-01 (×2): 5 mg via ORAL
  Filled 2018-08-31 (×2): qty 1

## 2018-08-31 MED ORDER — ESCITALOPRAM OXALATE 10 MG PO TABS
20.0000 mg | ORAL_TABLET | Freq: Every day | ORAL | Status: DC
  Administered 2018-08-31 – 2018-09-01 (×2): 20 mg via ORAL
  Filled 2018-08-31 (×2): qty 2

## 2018-08-31 MED ORDER — ROSUVASTATIN CALCIUM 20 MG PO TABS
40.0000 mg | ORAL_TABLET | Freq: Every day | ORAL | Status: DC
  Administered 2018-08-31: 40 mg via ORAL
  Filled 2018-08-31 (×2): qty 2

## 2018-08-31 MED ORDER — DIVALPROEX SODIUM 250 MG PO DR TAB
250.0000 mg | DELAYED_RELEASE_TABLET | Freq: Two times a day (BID) | ORAL | Status: DC
  Administered 2018-08-31 – 2018-09-01 (×3): 250 mg via ORAL
  Filled 2018-08-31 (×3): qty 1

## 2018-08-31 MED ORDER — INSULIN DETEMIR 100 UNIT/ML ~~LOC~~ SOLN
20.0000 [IU] | Freq: Every day | SUBCUTANEOUS | Status: DC
  Administered 2018-09-01: 20 [IU] via SUBCUTANEOUS
  Filled 2018-08-31 (×3): qty 0.2

## 2018-08-31 MED ORDER — SODIUM CHLORIDE 0.9 % IV BOLUS
500.0000 mL | Freq: Once | INTRAVENOUS | Status: AC
  Administered 2018-08-31: 500 mL via INTRAVENOUS

## 2018-08-31 MED ORDER — SODIUM CHLORIDE 0.9 % IV SOLN
Freq: Once | INTRAVENOUS | Status: AC
  Administered 2018-08-31: 15:00:00 via INTRAVENOUS

## 2018-08-31 MED ORDER — SODIUM CHLORIDE 0.9 % IV SOLN
1.0000 g | Freq: Once | INTRAVENOUS | Status: AC
  Administered 2018-08-31: 1 g via INTRAVENOUS
  Filled 2018-08-31: qty 10

## 2018-08-31 MED ORDER — CLOPIDOGREL BISULFATE 75 MG PO TABS
75.0000 mg | ORAL_TABLET | Freq: Every day | ORAL | Status: DC
  Administered 2018-08-31 – 2018-09-01 (×2): 75 mg via ORAL
  Filled 2018-08-31 (×2): qty 1

## 2018-08-31 NOTE — ED Notes (Signed)
Patient's social worker Tia Alert [phone]) called to check on status of patient placement. Informed her that she had been accepted to [facility] and that that we would call her upon patient's discharge from ED.

## 2018-08-31 NOTE — ED Notes (Signed)
Patient took medications whole in applesauce.

## 2018-08-31 NOTE — ED Provider Notes (Signed)
-----------------------------------------   6:36 AM on 08/31/2018 -----------------------------------------   Blood pressure (!) 158/66, pulse 62, temperature 98 F (36.7 C), resp. rate 12, height 5\' 5"  (1.651 m), weight 72 kg, SpO2 100 %.  The patient is sleeping at this time.  There have been no acute events since the last update.  Patient's home medicines have been ordered.  Awaiting disposition plan from social work team.   Paulette Blanch, MD 08/31/18 [phone]

## 2018-08-31 NOTE — ED Notes (Signed)
Speech therapy at bedside with patient

## 2018-08-31 NOTE — TOC Progression Note (Addendum)
Transition of Care [facility]) - Progression Note    Patient Details  Name: Kelly Romero MRN: [id number] Date of Birth: 13-Jun-1965  [facility] CM/SW Contact  Tania Kimiye Strathman, LCSW Phone Number: 08/31/2018, 10:08 AM  Clinical Narrative:    10:00am - CSW began bed search.   12:04pm- PASRR# [id number]  12:25pm -Therapy notes faxed to facilities   1:00pm - Clinicals faxed to [facility]. Claiborne Billings shared that they will be able to accept pt.   1:20pm - CSW notified pt, and pt's daughter Kelly Romero. [name] affirmed that she has been taking care of pt, and told CSW to tell pt that "she loves her."   CSW waiting for COVID-19 test results to begin transfer. EDP and RN notified.   3:50pm - CSW informed Claiborne Billings at [facility] that COVID-19 test is negative. CSW waiting to hear back about Medicare authorization.   4:33pm - CSW was informed that patient will not be able to go to [facility] due to pt not having a "skillable need" and that she is too high functioning.  CSW contacted pt's social worker Tia Alert [phone]) to update her regarding patient placement. [place] suggested that CSW try other SNF since patient is bed bound, and unable to transfer. CSW was also informed that social worker has tried to place pt in ALF from home but was told she needed to be placed from the hospital CSW faxed patient to [facility].   CSW will attempt to place patient in SNF. If unsuccessful will attempt to place patient in ALF.   Patient's daughter, Kelly Romero was informed of this, and agreed.    Expected Discharge Plan: [place] Barriers to Discharge: Continued Medical Work up  Expected Discharge Plan and Services Expected Discharge Plan: [place]   Discharge Planning Services: CM Consult   Living arrangements for the past 2 months: Single Family Home                                       Social Determinants of  Health (SDOH) Interventions    Readmission Risk Interventions No flowsheet data found.

## 2018-08-31 NOTE — ED Notes (Signed)
Purewick replaced on patient. Patient informed that OT and speech therapy will be in shortly to evaluate her. Patient verbalized understanding. No further needs expressed at this time.

## 2018-08-31 NOTE — ED Notes (Addendum)
Pt gave verbal permission to speak with and give her daughter an update

## 2018-08-31 NOTE — ED Notes (Addendum)
Patient able to swallow pills whole in apple sauce. Depakote had to be cut in half, due to size.

## 2018-08-31 NOTE — NC FL2 (Signed)
[place] LEVEL OF CARE SCREENING TOOL     IDENTIFICATION  Patient Name: Kelly Romero Birthdate: May 13, 1965 Sex: female Admission Date (Current Location): 08/30/2018  [place] and [place] Number:  [name] [id number] [place] and Address:  [facility], [address] [place], [place] [address]      Provider Number: [id number]  Attending Physician Name and Address:  No att. providers found  Relative Name and Phone Number:  Kelly Romero, daughter [phone]    Current Level of Care: SNF Recommended Level of Care: [place] Prior Approval Number:    Date Approved/Denied:   PASRR Number: [id number]  Discharge Plan: SNF    Current Diagnoses: Patient Active Problem List   Diagnosis Date Noted  . Hemiparesis affecting right side as late effect of cerebrovascular accident ([place]) 09/14/2017  . Extension of stroke ([place]) 08/06/2017  . Inadequate dietary intake 08/06/2017  . Hypoalbuminemia due to protein-calorie malnutrition ([place])   . Orthostatic hypotension   . Poorly controlled type 2 diabetes mellitus with peripheral neuropathy ([place])   . Emotional lability   . Thrombotic stroke ([place]) 07/13/2017  . Diabetes mellitus type 2 in obese ([place])   . History of CVA (cerebrovascular accident)   . Benign essential HTN   . Late effect of cerebrovascular accident (CVA)   . Acute blood loss anemia   . Morbid obesity ([place])   . Right hemiparesis ([place])   . Cryptogenic stroke ([place]) 07/08/2017  . Chest pain 11/06/2016  . Mid (multi infarct dementia), without behavioral disturbance ([place]) 04/10/2016  . Aphasia as late effect of stroke 04/10/2016  . Encephalopathy   . Cerebral thrombosis with cerebral infarction 12/29/2015  . Hyperglycemia   . Hyperlipidemia   . Hypertensive emergency   . Deep venous thrombosis ([place])   . AKI (acute kidney injury) ([place]) 12/26/2015  . Altered mental status   . Stroke-like symptoms   . Acute  encephalopathy 08/17/2015  . Anxiety and depression   . CVA (cerebrovascular accident due to intracerebral hemorrhage) ([place]) 08/15/2015  . Stroke ([place])   . Encephalopathy, metabolic 44/31/5400  . Uncontrolled type 2 diabetes mellitus with diabetic polyneuropathy ([place]) 12/28/2014  . Essential hypertension 12/28/2014  . New onset headache 11/02/2014    Orientation RESPIRATION BLADDER Height & Weight     Self, Time, Place  Normal Incontinent Weight: 158 lb 11.7 oz (72 kg) Height:  5\' 5"  (165.1 cm)  BEHAVIORAL SYMPTOMS/MOOD NEUROLOGICAL BOWEL NUTRITION STATUS      Continent Diet  AMBULATORY STATUS COMMUNICATION OF NEEDS Skin   Extensive Assist Verbally Normal                       Personal Care Assistance Level of Assistance  Bathing, Feeding, Dressing Bathing Assistance: Limited assistance Feeding assistance: Independent Dressing Assistance: Maximum assistance     Functional Limitations Info  Sight, Hearing, Speech Sight Info: Adequate Hearing Info: Adequate Speech Info: Adequate    SPECIAL CARE FACTORS FREQUENCY  PT (By licensed PT), OT (By licensed OT)     PT Frequency: 5 times a week OT Frequency: 5 times a week            Contractures Contractures Info: Not present    Additional Factors Info  Code Status, Allergies Code Status Info: FULL Allergies Info: Erythromycin, Latex           Current Medications (08/31/2018):  This is the current hospital active medication list Current Facility-Administered Medications  Medication Dose Route Frequency  Provider Last Rate Last Dose  . butalbital-acetaminophen-caffeine (FIORICET) 50-325-40 MG per tablet 1 tablet  1 tablet Oral Once Phineas SemenGoodman, Graydon, MD      . clopidogrel (PLAVIX) tablet 75 mg  75 mg Oral Daily Irean HongSung, Jade J, MD      . divalproex (DEPAKOTE) DR tablet 250 mg  250 mg Oral BID Irean HongSung, Jade J, MD      . escitalopram (LEXAPRO) tablet 20 mg  20 mg Oral Daily Irean HongSung, Jade J, MD      . insulin detemir  (LEVEMIR) injection 20 Units  20 Units Subcutaneous Daily Irean HongSung, Jade J, MD      . lisinopril (ZESTRIL) tablet 5 mg  5 mg Oral Daily Irean HongSung, Jade J, MD      . metoprolol tartrate (LOPRESSOR) tablet 12.5 mg  12.5 mg Oral BID Irean HongSung, Jade J, MD      . rosuvastatin (CRESTOR) tablet 40 mg  40 mg Oral QHS Irean HongSung, Jade J, MD       Current Outpatient Medications  Medication Sig Dispense Refill  . clopidogrel (PLAVIX) 75 MG tablet Take 1 tablet (75 mg total) by mouth daily. 30 tablet 0  . divalproex (DEPAKOTE) 250 MG DR tablet Take 1 tablet (250 mg total) by mouth 2 (two) times daily. 60 tablet 0  . escitalopram (LEXAPRO) 20 MG tablet Take 1 tablet (20 mg total) by mouth daily. 30 tablet 5  . insulin detemir (LEVEMIR) 100 UNIT/ML injection Inject 0.2 mLs (20 Units total) into the skin daily before breakfast. (Patient taking differently: Inject 20 Units into the skin daily. ) 10 mL 11  . lisinopril (ZESTRIL) 5 MG tablet Take 5 mg by mouth daily.     . metoprolol tartrate (LOPRESSOR) 25 MG tablet Take 0.5 tablets (12.5 mg total) by mouth 2 (two) times daily. 30 tablet 0  . polyethylene glycol (MIRALAX / GLYCOLAX) packet May take 17 g with 8 oz water per peg tube daily prn 30 each 0  . rosuvastatin (CRESTOR) 40 MG tablet Take 1 tablet (40 mg total) by mouth at bedtime. 30 tablet 0  . cephALEXin (KEFLEX) 500 MG capsule Take 1 capsule (500 mg total) by mouth 3 (three) times daily. 30 capsule 0     Discharge Medications: Please see discharge summary for a list of discharge medications.  Relevant Imaging Results:  Relevant Lab Results:   Additional Information SSN: [id number]  Kelly Broly Hatfield, LCSW

## 2018-08-31 NOTE — ED Notes (Signed)
OT at bedside with patient. 

## 2018-08-31 NOTE — Evaluation (Signed)
Occupational Therapy Evaluation Patient Details Name: Kelly Romero MRN: [id number] DOB: 01/29/1966 Today's Date: 08/31/2018    History of Present Illness Pt comes to ED secondary to increased weakness and family inability to care for patient in home environment. Pt is requesting SNF placement at this time. History includes DM, HA, HTN, and multiple CVA with R side deficits. Previously was recieving HHPT.   Clinical Impression   Pt seen for OT eval this date. Prior to admission, pt required assist for ADL and all transfers from family. Pt reports not ambulating and that she has been using a lift for transfers because she "couldn't do it." Pt presents with decreased strength, activity tolerance, headache, balance, and increased need for assistance. Pt requires mod-max assist for LB ADL from a seated position, R lat lean, and is unable to problem solve safely for medication mgt, meal prep, and other independent living skills necessary to live by herself, which pt states is her ultimate goal. Pt will benefit from skilled OT services to maximize return to PLOF with improved independence and safety. Recommend STR.    Follow Up Recommendations  SNF    Equipment Recommendations  Other (comment)(reacher)    Recommendations for Other Services       Precautions / Restrictions Precautions Precautions: Fall Restrictions Weight Bearing Restrictions: No      Mobility Bed Mobility Overal bed mobility: Needs Assistance Bed Mobility: Supine to Sit;Sit to Supine     Supine to sit: Min assist Sit to supine: Min assist;Mod assist   General bed mobility comments: able to initiate movement and use of hospital bed/railing. Pt needs help for final transfer from sidelying to sitting. Once seated, tends R lateral leaning, able to self correct with min assist.  Transfers Overall transfer level: Needs assistance Equipment used: Rolling walker (2 wheeled) Transfers: Sit to/from Stand Sit to Stand:  Mod assist         General transfer comment: Able to place R hand on RW prior to transfer. Needs heavy assist for transfer and cues for technique. Post leaning noted with weight shifted outside of BOS. Unable to correct. 2 attempts, only able to maintain standing <10 seconds. No buckling    Balance Overall balance assessment: Needs assistance Sitting-balance support: Feet supported;Bilateral upper extremity supported Sitting balance-Leahy Scale: Fair Sitting balance - Comments: R lateral leaning   Standing balance support: Bilateral upper extremity supported Standing balance-Leahy Scale: Poor Standing balance comment: post leaning and trunk forward flexed                           ADL either performed or assessed with clinical judgement   ADL Overall ADL's : Needs assistance/impaired Eating/Feeding: Set up;Minimal assistance Eating/Feeding Details (indicate cue type and reason): pt may benefit from built up handle Grooming: Set up   Upper Body Bathing: Moderate assistance;Minimal assistance   Lower Body Bathing: Maximal assistance;Moderate assistance   Upper Body Dressing : Minimal assistance   Lower Body Dressing: Moderate assistance;Maximal assistance   Toilet Transfer: Moderate assistance;Maximal assistance;BSC;Stand-pivot;RW                   Vision Baseline Vision/History: Wears glasses Wears Glasses: Reading only Patient Visual Report: No change from baseline       Perception     Praxis      Pertinent Vitals/Pain Pain Assessment: 0-10 Pain Score: 7  Pain Location: headache Pain Descriptors / Indicators: Aching;Headache Pain Intervention(s): Limited activity within patient's  tolerance;Monitored during session     Hand Dominance Right   Extremity/Trunk Assessment Upper Extremity Assessment Upper Extremity Assessment: Generalized weakness(RUE slightly weaker than R, coordination intact bilaterally, slight tremors in hands bilaterally)    Lower Extremity Assessment Lower Extremity Assessment: Generalized weakness;Defer to PT evaluation(RLE weaker than LLE)       Communication Communication Communication: Expressive difficulties   Cognition Arousal/Alertness: Awake/alert Behavior During Therapy: (a bit worried, emotional) Overall Cognitive Status: Within Functional Limits for tasks assessed                                     General Comments       Exercises Exercises: Other exercises Other Exercises Other Exercises: pt educated in built up handle for self feeding, pt interested in trialing during future therapy session   Shoulder Instructions      Home Living Family/patient expects to be discharged to:: Private residence Living Arrangements: Children(daughter, son in Sports coach and young grandchildren) Available Help at Discharge: Available PRN/intermittently(family works alternate hours to be available as much as poss) Type of Home: House Home Access: Ramped entrance     [place]: One level     Bathroom Shower/Tub: Teacher, early years/pre: [place]: Environmental consultant - 4 wheels;Bedside commode;Wheelchair - Ecologist)          Prior Functioning/Environment Level of Independence: Needs assistance        Comments: pt is poor historian regarding timing, reporting her days run together. She reports it's been weeks since she got OOB and longer than that since she has walked. Currently she uses lift to transfer to bathroom or WC and is propelled by family. Needs assist with all ADLs.        OT Problem List: Decreased strength;Pain;Impaired balance (sitting and/or standing);Decreased knowledge of use of DME or AE;Impaired UE functional use      OT Treatment/Interventions: Self-care/ADL training;Therapeutic activities;Therapeutic exercise;DME and/or AE instruction;Patient/family education;Balance training    OT Goals(Current goals can be found  in the care plan section) Acute Rehab OT Goals Patient Stated Goal: to learn how to walk again OT Goal Formulation: With patient Time For Goal Achievement: 09/14/18 Potential to Achieve Goals: Good ADL Goals Pt Will Perform Eating: with set-up;with adaptive utensils;sitting Pt Will Perform Lower Body Dressing: with mod assist;sitting/lateral leans;with adaptive equipment Pt Will Transfer to Toilet: with min assist;with mod assist;bedside commode;stand pivot transfer  OT Frequency: Min 1X/week   Barriers to D/C: Decreased caregiver support          Co-evaluation              AM-PAC OT "6 Clicks" Daily Activity     Outcome Measure Help from another person eating meals?: A Little Help from another person taking care of personal grooming?: A Little Help from another person toileting, which includes using toliet, bedpan, or urinal?: A Lot Help from another person bathing (including washing, rinsing, drying)?: A Lot Help from another person to put on and taking off regular upper body clothing?: A Little Help from another person to put on and taking off regular lower body clothing?: A Lot 6 Click Score: 15   End of Session    Activity Tolerance: Patient tolerated treatment well Patient left: in bed;with bed alarm set;Other (comment);with call bell/phone within reach(with SLP in room to assess)  OT Visit Diagnosis: Other abnormalities of gait and  mobility (R26.89);Feeding difficulties (R63.3);Muscle weakness (generalized) (M62.81)                Time: 1610-96041355-1412 OT Time Calculation (min): 17 min Charges:  OT General Charges $OT Visit: 1 Visit OT Evaluation $OT Eval Moderate Complexity: 1 Mod  Richrd PrimeJamie Stiller, MPH, MS, OTR/L ascom [phone] 08/31/18, 3:01 PM

## 2018-08-31 NOTE — ED Notes (Signed)
Dietary called about status of tray. States tray should be up shortly.

## 2018-08-31 NOTE — ED Notes (Signed)
Sarah, NT at bedside to assist patient with eating her meal.

## 2018-08-31 NOTE — ED Notes (Signed)
Assisted pt with setting up dinner tray. After setting up the tray, patient ate and drank her water independently. Pt ate 50 percent of food, and drank 1/2 cup of water.

## 2018-08-31 NOTE — ED Notes (Signed)
Assisted pt with setting up her tray, after tray was set up, pt ate independently. Pt ate 25 percent of her meal and drank 1/2 of her milk.

## 2018-08-31 NOTE — Evaluation (Signed)
Physical Therapy Evaluation Patient Details Name: Kelly Romero MRN: [id number] DOB: 08/25/1965 Today's Date: 08/31/2018   History of PreHaskel Khansent Illness  Pt comes to ED secondary to increased weakness and family inability to care for patient in home environment. Pt is requesting SNF placement at this time. History includes DM, HA, HTN, and multiple CVA with R side deficits. Previously was recieving HHPT.  Clinical Impression  Pt is a pleasant 53 year old female who was admitted for weakness and home safety issues. Pt performs bed mobility with min assist, transfers with mod assist using RW and unable to ambulate. Once back in bed, [facility] assist. Pt demonstrates deficits with strength ( R > L), balance, and mobility. Pt appears motivated to participate in therapy and is eager to regain some independence back with eventual goal to live on her own. Discussed care with CSW. Would benefit from skilled PT to address above deficits and promote optimal return to PLOF;recommend transition to STR upon discharge from acute hospitalization.       Follow Up Recommendations SNF    Equipment Recommendations  (TBD)    Recommendations for Other Services       Precautions / Restrictions Precautions Precautions: Fall Restrictions Weight Bearing Restrictions: No      Mobility  Bed Mobility Overal bed mobility: Needs Assistance Bed Mobility: Supine to Sit     Supine to sit: Min assist     General bed mobility comments: able to initiate movement and use of hospital bed/railing. Pt needs help for final transfer from sidelying to sitting. Once seated, tends R lateral leaning, able to self correct with min assist.  Transfers Overall transfer level: Needs assistance Equipment used: Rolling walker (2 wheeled) Transfers: Sit to/from Stand Sit to Stand: Mod assist         General transfer comment: Able to place R hand on RW prior to transfer. Needs heavy assist for  transfer and cues for technique. Post leaning noted with weight shifted outside of BOS. Unable to correct. 2 attempts, only able to maintain standing <10 seconds. No buckling  Ambulation/Gait             General Gait Details: unable  Stairs            Wheelchair Mobility    Modified Rankin (Stroke Patients Only)       Balance Overall balance assessment: Needs assistance Sitting-balance support: Feet supported;Bilateral upper extremity supported Sitting balance-Leahy Scale: Fair Sitting balance - Comments: R lateral leaning   Standing balance support: Bilateral upper extremity supported Standing balance-Leahy Scale: Poor Standing balance comment: post leaning and trunk forward flexed                             Pertinent Vitals/Pain Pain Assessment: No/denies pain    Home Living Family/patient expects to be discharged to:: Private residence Living Arrangements: Children(daughter, son in Social workerlaw and young grandchildren) Available Help at Discharge: Available PRN/intermittently(family works alternate hours to be available as much as poss) Type of Home: House Home Access: Ramped entrance     Home Layout: One level Home Equipment: Environmental consultantWalker - 4 wheels;Bedside commode;Wheelchair - Art gallery managermanual;Hospital bed(lift equipment)      Prior Function Level of Independence: Needs assistance         Comments: pt is poor historian regarding timing, reporting her days run together. She reports it's been weeks since she got OOB and longer than that  since she has walked. Currently she uses lift to transfer to bathroom or WC and is propelled by family. Needs assist with all ADLs.     Hand Dominance        Extremity/Trunk Assessment   Upper Extremity Assessment Upper Extremity Assessment: Generalized weakness(R fisting present, grossly 3+/5)    Lower Extremity Assessment Lower Extremity Assessment: Generalized weakness(R LE grossly 3+/5; L LE grossly 4/5)        Communication   Communication: Expressive difficulties  Cognition Arousal/Alertness: Awake/alert Behavior During Therapy: (emotional) Overall Cognitive Status: Within Functional Limits for tasks assessed                                        General Comments      Exercises Other Exercises Other Exercises: Sat EOB and worked on upright posture in AP plane as well in standing with weight shifts. Use of RW. Reports dizziness with upright posture, historically limited by orthostatis, BP not obtained   Assessment/Plan    PT Assessment Patient needs continued PT services  PT Problem List Decreased strength;Decreased activity tolerance;Decreased balance;Decreased mobility;Decreased safety awareness       PT Treatment Interventions Gait training;Therapeutic activities;Therapeutic exercise;Balance training;Neuromuscular re-education;Wheelchair mobility training;DME instruction    PT Goals (Current goals can be found in the Care Plan section)  Acute Rehab PT Goals Patient Stated Goal: to learn how to walk again PT Goal Formulation: With patient Time For Goal Achievement: 09/14/18 Potential to Achieve Goals: Good    Frequency Min 2X/week   Barriers to discharge Decreased caregiver support      Co-evaluation               AM-PAC PT "6 Clicks" Mobility  Outcome Measure Help needed turning from your back to your side while in a flat bed without using bedrails?: A Little Help needed moving from lying on your back to sitting on the side of a flat bed without using bedrails?: A Little Help needed moving to and from a bed to a chair (including a wheelchair)?: Total Help needed standing up from a chair using your arms (e.g., wheelchair or bedside chair)?: Total Help needed to walk in hospital room?: Total Help needed climbing 3-5 steps with a railing? : Total 6 Click Score: 10    End of Session Equipment Utilized During Treatment: Gait belt Activity Tolerance:  Patient tolerated treatment well Patient left: in bed(bed alarm not working) Nurse Communication: Mobility status PT Visit Diagnosis: Unsteadiness on feet (R26.81);Muscle weakness (generalized) (M62.81);Difficulty in walking, not elsewhere classified (R26.2)    Time: 2094-7096 PT Time Calculation (min) (ACUTE ONLY): 27 min   Charges:   PT Evaluation $PT Eval Low Complexity: 1 Low PT Treatments $Therapeutic Activity: 8-22 mins        Greggory Stallion, PT, DPT [phone]   Alexandria Shiflett 08/31/2018, 12:11 PM

## 2018-08-31 NOTE — ED Notes (Signed)
Assisted pt to sit up

## 2018-08-31 NOTE — ED Notes (Signed)
Pt repositioned in bed.

## 2018-08-31 NOTE — Evaluation (Signed)
Clinical/Bedside Swallow Evaluation Patient Details  Name: Kelly Romero MRN: [id number] Date of Birth: 12/26/1965  Today's Date: 08/31/2018 Time: SLP Start Time (ACUTE ONLY): 1425 SLP Stop Time (ACUTE ONLY): 1515 SLP Time Calculation (min) (ACUTE ONLY): 50 min  Past Medical History:  Past Medical History:  Diagnosis Date  . Diabetes mellitus without complication (HCC)   . Headache   . Hyperlipidemia   . Hypertension   . Stroke (HCC)   . TIA (transient ischemic attack)    Past Surgical History:  Past Surgical History:  Procedure Laterality Date  . APPENDECTOMY    . CHOLECYSTECTOMY    . FOOT GANGLION EXCISION    . HERNIA REPAIR    . IR GASTROSTOMY TUBE MOD SED  07/30/2017  . IR GASTROSTOMY TUBE REMOVAL  07/15/2018  . IR GENERIC HISTORICAL  12/30/2015   IR ANGIO INTRA EXTRACRAN SEL COM CAROTID INNOMINATE BILAT MOD SED 12/30/2015 Julieanne CottonSanjeev Deveshwar, MD [facility]  . IR GENERIC HISTORICAL  12/30/2015   IR ANGIO VERTEBRAL SEL VERTEBRAL BILAT MOD SED 12/30/2015 Julieanne CottonSanjeev Deveshwar, MD [facility]  . LOOP RECORDER INSERTION N/A 07/13/2017   Procedure: LOOP RECORDER INSERTION;  Surgeon: Hillis RangeAllred, James, MD;  Location: [facility];  Service: Cardiovascular;  Laterality: N/A;  . TEE WITHOUT CARDIOVERSION N/A 04/19/2015   Procedure: TRANSESOPHAGEAL ECHOCARDIOGRAM (TEE);  Surgeon: Jake BatheMark C Skains, MD;  [facility];  Service: Cardiovascular;  Laterality: N/A;  . VAGINAL HYSTERECTOMY     HPI:  Pt comes to ED secondary to increased weakness and family inability to care for patient in home environment. Pt is requesting SNF placement at this time. History includes DM, HA, HTN, and multiple CVA with R side deficits. Previously was recieving HHPT.  Per chart notes and pt report, pt had a PEG placement until last month when it was removed. Pt's last MBSS was in 09/2017 when pt was advanced to a Dysphagia level 3/2 diet w/ Thin liquids; "a much improved swallow function with overall mild  oropharyngeal dysphagia.  There was continued difficulty with oral cohesion of bolus material, particularly liquids, and prolonged but effective mastication of soft solids". It appeared pt continued to exhibit a delayed pharyngeal swallow initiation and pharyngeal residue per study report. Pills recommended whole in Puree; BOT and pharyngeal strengthening exs recommended then. Pt denied any consistent swallowing problems since that time, or since having PEG tube removed. She endorsed she did NOT have the assistance from her family w/ eating/drinking and that "sometimes they would not sit me up to eat or drink". Pt required Mod assistance w/ sitting upright for this exam. Pt also continues to exhibit Motor Speech deficits (Apraxia?) which were noted in 07/2017 during Inpatient tx. Pt exhibits min UE R sided weakness.   Assessment / Plan / Recommendation Clinical Impression  Pt appears to present w/ grossly adequate oropharyngeal phase function of swallowing w/ trial consistencies assessed; min oral phase deficits noted c/b min increased time/effort needed for full mastication of soft solids. Suspect impacted by pt's decreased lingual/labial coordination, motor coordination deficits s/p previous CVAs. Motor Speech deficits noted in her conversation/responses as well - this had been targeted during Inpatient tx in 2019 per chart notes. Pt required Min extra time for follow through w/ all tasks. Pt positioned upright in bed and given trials of ice chips, thin liquids and purees/soft solids w/ no immediate, overt s/s of aspiration noted; clear vocal quality followed and no decline in O2 sats or respiratory effort during/post trials. Pt helped  to held cup when drinking - No straws utilized. Oral phase was c/b min increased mastication time/effort w/ trials of soft solids and for bolus organization and A-P transfer for swallowing; she cleared orally given time. Pt appeared to intermittently hold the po trials to  organize and ready herself for swallowing. No oral residue or bolus loss noted. Pt was instructed to take her time and use SMALL bites/sips. Pt helped to feed self w/ setup. OM exam during bolus management appeared grossly WFL; no unilateral weakness noted during oral exam of individual movements - slower coordinated movements noted. Pt required min cueing for full follow through w/ tasks. Recommend a Dysphagia level 3(minced meats as needed) diet w/ thin liquids - NO STRAWS; aspiration precautions; Pills in Puree - Whole for safer swallowing. Full tray setup and assistance at meals including positioning upright d/t decreased strength. ST services can continue to monitor pt's status and toleration of diet and provide education as needed at next venue/SNF. Dietician f/u recommended. NSG present and updated.  SLP Visit Diagnosis: Dysphagia, oral phase (R13.11)(this can impact the pharyngeal phase of swallowing)    Aspiration Risk  Mild aspiration risk(but reduced following general precautions)    Diet Recommendation  Dysphagia level 3 (w/ minced meats/foods as needed), Thin liquids. General aspiration precautions; tray setup and positioning assistance at all meals.  Medication Administration: Whole meds with puree(for safer swallowing from this time forward)    Other  Recommendations Recommended Consults: (Dietician f/u) Oral Care Recommendations: Oral care BID;Oral care before and after PO;Staff/trained caregiver to provide oral care(assist) Other Recommendations: (n/a)   Follow up Recommendations Skilled Nursing facility(consider monitoring for toleration of diet x1-2 sessions )      Frequency and Duration (TBD)  (TBD)       Prognosis Prognosis for Safe Diet Advancement: Fair Barriers to Reach Goals: Time post onset;Severity of deficits Barriers/Prognosis Comment: deficits from previous CVA      Swallow Study   General Date of Onset: 08/30/18 HPI: Pt comes to ED secondary to increased  weakness and family inability to care for patient in home environment. Pt is requesting SNF placement at this time. History includes DM, HA, HTN, and multiple CVA with R side deficits. Previously was recieving HHPT.  Per chart notes and pt report, pt had a PEG placement until last month when it was removed. Pt's last MBSS was in 09/2017 when pt was advanced to a Dysphagia level 3/2 diet w/ Thin liquids; "a much improved swallow function with overall mild oropharyngeal dysphagia.  There was continued difficulty with oral cohesion of bolus material, particularly liquids, and prolonged but effective mastication of soft solids". It appeared pt continued to exhibit a delayed pharyngeal swallow initiation and pharyngeal residue per study report. Pills recommended whole in Puree; BOT and pharyngeal strengthening exs recommended then. Pt denied any consistent swallowing problems since that time, or since having PEG tube removed. She endorsed she did NOT have the assistance from her family w/ eating/drinking and that "sometimes they would not sit me up to eat or drink". Pt required Mod assistance w/ sitting upright for this exam. Pt also continues to exhibit Motor Speech deficits (Apraxia?) which were noted in 07/2017 during Inpatient tx. Pt exhibits min UE R sided weakness. Type of Study: Bedside Swallow Evaluation Previous Swallow Assessment: 2019 post CVA Diet Prior to this Study: NPO(but on a regular diet "at home" per pt report) Temperature Spikes Noted: No(wbc 5.5) Respiratory Status: Room air History of Recent Intubation: No Behavior/Cognition:  Alert;Cooperative;Pleasant mood;Requires cueing(min) Oral Cavity Assessment: Dry(min) Oral Care Completed by SLP: Recent completion by staff Oral Cavity - Dentition: Adequate natural dentition Vision: Functional for self-feeding Self-Feeding Abilities: Able to feed self;Needs assist;Needs set up(support) Patient Positioning: Upright in bed(needed  positioning) Baseline Vocal Quality: Normal;Low vocal intensity(slight) Volitional Cough: Strong Volitional Swallow: Able to elicit    Oral/Motor/Sensory Function Overall Oral Motor/Sensory Function: Mild impairment(min decreased coordinated, quick movements) Facial ROM: Within Functional Limits Facial Symmetry: Within Functional Limits Lingual ROM: Within Functional Limits Lingual Symmetry: Within Functional Limits Lingual Strength: Within Functional Limits Velum: Within Functional Limits Mandible: Within Functional Limits   Ice Chips Ice chips: Impaired Presentation: Spoon(fed; 3 trials) Oral Phase Impairments: Reduced lingual movement/coordination(min) Oral Phase Functional Implications: Prolonged oral transit(min) Pharyngeal Phase Impairments: (none)   Thin Liquid Thin Liquid: Impaired Presentation: Cup;Self Fed(15+ trials) Oral Phase Impairments: Reduced lingual movement/coordination Oral Phase Functional Implications: Prolonged oral transit(hesitation in A-P transfer/swallow) Pharyngeal  Phase Impairments: (none) Other Comments: pt stated this is "usual" for her - suspect since previous CVA    Nectar Thick Nectar Thick Liquid: Not tested   Honey Thick Honey Thick Liquid: Not tested   Puree Puree: Within functional limits(grossly) Presentation: Spoon;Self Fed(assisted; 8 trials) Oral Phase Functional Implications: (min extra time intermittently noted) Pharyngeal Phase Impairments: (none)   Solid     Solid: Impaired Presentation: Spoon;Self Fed(5 trials) Oral Phase Impairments: Reduced lingual movement/coordination;Impaired mastication(min) Oral Phase Functional Implications: Impaired mastication;Prolonged oral transit(min) Pharyngeal Phase Impairments: (none) Other Comments: stated this was "usual" for her       Kelly Kenner, MS, CCC-SLP Kelly Romero,Kelly Romero 08/31/2018,5:20 PM

## 2018-09-01 DIAGNOSIS — E46 Unspecified protein-calorie malnutrition: Secondary | ICD-10-CM | POA: Diagnosis not present

## 2018-09-01 DIAGNOSIS — R1312 Dysphagia, oropharyngeal phase: Secondary | ICD-10-CM | POA: Diagnosis not present

## 2018-09-01 DIAGNOSIS — M6281 Muscle weakness (generalized): Secondary | ICD-10-CM | POA: Diagnosis not present

## 2018-09-01 DIAGNOSIS — R279 Unspecified lack of coordination: Secondary | ICD-10-CM | POA: Diagnosis not present

## 2018-09-01 DIAGNOSIS — I69320 Aphasia following cerebral infarction: Secondary | ICD-10-CM | POA: Diagnosis not present

## 2018-09-01 DIAGNOSIS — Z591 Inadequate housing: Secondary | ICD-10-CM | POA: Diagnosis not present

## 2018-09-01 DIAGNOSIS — D500 Iron deficiency anemia secondary to blood loss (chronic): Secondary | ICD-10-CM | POA: Diagnosis not present

## 2018-09-01 DIAGNOSIS — I69851 Hemiplegia and hemiparesis following other cerebrovascular disease affecting right dominant side: Secondary | ICD-10-CM | POA: Diagnosis not present

## 2018-09-01 DIAGNOSIS — I82409 Acute embolism and thrombosis of unspecified deep veins of unspecified lower extremity: Secondary | ICD-10-CM | POA: Diagnosis not present

## 2018-09-01 DIAGNOSIS — E1165 Type 2 diabetes mellitus with hyperglycemia: Secondary | ICD-10-CM | POA: Diagnosis not present

## 2018-09-01 DIAGNOSIS — G4700 Insomnia, unspecified: Secondary | ICD-10-CM | POA: Diagnosis not present

## 2018-09-01 DIAGNOSIS — E785 Hyperlipidemia, unspecified: Secondary | ICD-10-CM | POA: Diagnosis not present

## 2018-09-01 DIAGNOSIS — I69391 Dysphagia following cerebral infarction: Secondary | ICD-10-CM | POA: Diagnosis not present

## 2018-09-01 DIAGNOSIS — R410 Disorientation, unspecified: Secondary | ICD-10-CM | POA: Diagnosis not present

## 2018-09-01 DIAGNOSIS — F339 Major depressive disorder, recurrent, unspecified: Secondary | ICD-10-CM | POA: Diagnosis not present

## 2018-09-01 DIAGNOSIS — I69390 Apraxia following cerebral infarction: Secondary | ICD-10-CM | POA: Diagnosis not present

## 2018-09-01 DIAGNOSIS — G43909 Migraine, unspecified, not intractable, without status migrainosus: Secondary | ICD-10-CM | POA: Diagnosis not present

## 2018-09-01 DIAGNOSIS — Z741 Need for assistance with personal care: Secondary | ICD-10-CM | POA: Diagnosis not present

## 2018-09-01 DIAGNOSIS — I6990 Unspecified sequelae of unspecified cerebrovascular disease: Secondary | ICD-10-CM | POA: Diagnosis not present

## 2018-09-01 DIAGNOSIS — F0390 Unspecified dementia without behavioral disturbance: Secondary | ICD-10-CM | POA: Diagnosis not present

## 2018-09-01 DIAGNOSIS — R51 Headache: Secondary | ICD-10-CM | POA: Diagnosis not present

## 2018-09-01 DIAGNOSIS — E1140 Type 2 diabetes mellitus with diabetic neuropathy, unspecified: Secondary | ICD-10-CM | POA: Diagnosis not present

## 2018-09-01 DIAGNOSIS — Z743 Need for continuous supervision: Secondary | ICD-10-CM | POA: Diagnosis not present

## 2018-09-01 DIAGNOSIS — E119 Type 2 diabetes mellitus without complications: Secondary | ICD-10-CM | POA: Diagnosis not present

## 2018-09-01 DIAGNOSIS — M2042 Other hammer toe(s) (acquired), left foot: Secondary | ICD-10-CM | POA: Diagnosis not present

## 2018-09-01 DIAGNOSIS — I160 Hypertensive urgency: Secondary | ICD-10-CM | POA: Diagnosis not present

## 2018-09-01 DIAGNOSIS — R531 Weakness: Secondary | ICD-10-CM | POA: Diagnosis not present

## 2018-09-01 DIAGNOSIS — I10 Essential (primary) hypertension: Secondary | ICD-10-CM | POA: Diagnosis not present

## 2018-09-01 DIAGNOSIS — Z8673 Personal history of transient ischemic attack (TIA), and cerebral infarction without residual deficits: Secondary | ICD-10-CM | POA: Diagnosis not present

## 2018-09-01 DIAGNOSIS — F319 Bipolar disorder, unspecified: Secondary | ICD-10-CM | POA: Diagnosis not present

## 2018-09-01 DIAGNOSIS — B351 Tinea unguium: Secondary | ICD-10-CM | POA: Diagnosis not present

## 2018-09-01 DIAGNOSIS — R262 Difficulty in walking, not elsewhere classified: Secondary | ICD-10-CM | POA: Diagnosis not present

## 2018-09-01 DIAGNOSIS — Z881 Allergy status to other antibiotic agents status: Secondary | ICD-10-CM | POA: Diagnosis not present

## 2018-09-01 DIAGNOSIS — N390 Urinary tract infection, site not specified: Secondary | ICD-10-CM | POA: Diagnosis not present

## 2018-09-01 DIAGNOSIS — R2681 Unsteadiness on feet: Secondary | ICD-10-CM | POA: Diagnosis not present

## 2018-09-01 DIAGNOSIS — K5900 Constipation, unspecified: Secondary | ICD-10-CM | POA: Diagnosis not present

## 2018-09-01 DIAGNOSIS — E8809 Other disorders of plasma-protein metabolism, not elsewhere classified: Secondary | ICD-10-CM | POA: Diagnosis not present

## 2018-09-01 DIAGNOSIS — F329 Major depressive disorder, single episode, unspecified: Secondary | ICD-10-CM | POA: Diagnosis not present

## 2018-09-01 DIAGNOSIS — N179 Acute kidney failure, unspecified: Secondary | ICD-10-CM | POA: Diagnosis not present

## 2018-09-01 DIAGNOSIS — I639 Cerebral infarction, unspecified: Secondary | ICD-10-CM | POA: Diagnosis not present

## 2018-09-01 DIAGNOSIS — F419 Anxiety disorder, unspecified: Secondary | ICD-10-CM | POA: Diagnosis not present

## 2018-09-01 DIAGNOSIS — H538 Other visual disturbances: Secondary | ICD-10-CM | POA: Diagnosis not present

## 2018-09-01 DIAGNOSIS — Z888 Allergy status to other drugs, medicaments and biological substances status: Secondary | ICD-10-CM | POA: Diagnosis not present

## 2018-09-01 DIAGNOSIS — I6930 Unspecified sequelae of cerebral infarction: Secondary | ICD-10-CM | POA: Diagnosis not present

## 2018-09-01 DIAGNOSIS — Z20828 Contact with and (suspected) exposure to other viral communicable diseases: Secondary | ICD-10-CM | POA: Diagnosis not present

## 2018-09-01 DIAGNOSIS — Z91040 Latex allergy status: Secondary | ICD-10-CM | POA: Diagnosis not present

## 2018-09-01 DIAGNOSIS — R482 Apraxia: Secondary | ICD-10-CM | POA: Diagnosis not present

## 2018-09-01 DIAGNOSIS — R1310 Dysphagia, unspecified: Secondary | ICD-10-CM | POA: Diagnosis not present

## 2018-09-01 DIAGNOSIS — I959 Hypotension, unspecified: Secondary | ICD-10-CM | POA: Diagnosis not present

## 2018-09-01 DIAGNOSIS — M79675 Pain in left toe(s): Secondary | ICD-10-CM | POA: Diagnosis not present

## 2018-09-01 DIAGNOSIS — F0150 Vascular dementia without behavioral disturbance: Secondary | ICD-10-CM | POA: Diagnosis not present

## 2018-09-01 LAB — NOVEL CORONAVIRUS, NAA (HOSP ORDER, SEND-OUT TO REF LAB; TAT 18-24 HRS): SARS-CoV-2, NAA: NOT DETECTED

## 2018-09-01 LAB — GLUCOSE, CAPILLARY: Glucose-Capillary: 86 mg/dL (ref 70–99)

## 2018-09-01 MED ORDER — CEPHALEXIN 500 MG PO CAPS: 500.0000 mg | ORAL_CAPSULE | Freq: Three times a day (TID) | ORAL | 0 refills | Status: DC

## 2018-09-01 NOTE — ED Notes (Signed)
Attempted to call report to [facility] [place] at [phone] at this time without answer.  Will attempt again.

## 2018-09-01 NOTE — ED Notes (Signed)
Report given to Jane RN

## 2018-09-01 NOTE — TOC Progression Note (Addendum)
Transition of Care [facility]) - Progression Note    Patient Details  Name: Kelly Romero MRN: [id number] Date of Birth: November 30, 1965  Transition of Care [facility]) CM/SW Contact  Tania Yordy Matton, LCSW Phone Number: 09/01/2018, 8:23 AM  Clinical Narrative:     8:22am - CSW faxed referral and clinicals to [facility] [place]   8:48am - CSW attempted to contact [facility] in [place]. Voicemail left.   10:45am - CSW attempted to follow-up with Marden Noble at [facility]. Voicemail left.   11:16am - [place] contacted CSW and asked additional questions regarding patient, and shared that she will call CSW back to make final decision on whether she will be able to accept patient.   1:47pm Ebony Hail from [facility] shared that they may be able to take pt under Medicaid. Ebony Hail will send paperwork to another department, and will let CSW know if they can take pt.  Pt's daughter, [name] was notified.   2:30pm - [place] shared that they will be able to accept patient. CSW faxed COVID results; waiting to fax after visit summary.  Patient's daughter,and patient have been notified.   Expected Discharge Plan: [place] Barriers to Discharge: Continued Medical Work up  Expected Discharge Plan and Services Expected Discharge Plan: [place]   Discharge Planning Services: CM Consult   Living arrangements for the past 2 months: Single Family Home                                       Social Determinants of Health (SDOH) Interventions    Readmission Risk Interventions No flowsheet data found.

## 2018-09-01 NOTE — ED Provider Notes (Signed)
-----------------------------------------   3:34 PM on 09/01/2018 -----------------------------------------  I was notified by the case management the patient has been accepted to an outside facility.  She will be placed in a skilled nursing facility.  Briefly, she has a history of stroke and is here with inability to care for self in her current living situation.  I reviewed her lab work and imaging, which does show a possible UTI for which she will be treated.  No apparent emergent pathology.  She has been given IV antibiotics and will be continued on an outpatient course.  Medication reconciliation performed.   Duffy Bruce, MD 09/01/18 1535

## 2018-09-01 NOTE — ED Notes (Signed)
This EDT set up PT's lunch tray. After setting up the lunch tray, pt ate independently. This EDT checked CBG.

## 2018-09-01 NOTE — ED Notes (Signed)
Pt resting quietly.

## 2018-09-01 NOTE — TOC Progression Note (Addendum)
Transition of Care [facility] - Progression Note    Patient Details  Name: Kelly Romero MRN: [id number] Date of Birth: August 22, 1965  Transition of [facility]) CM/SW Contact  Marshell Garfinkel, RN Phone Number: 09/01/2018, 9:55 AM  Clinical Narrative:    [place] cannot meet patient's needs at this time. Message left for [place] at [facility]. Spoke with Otila Kluver at [facility] [employer] and her directors are reviewing case. Message sent to Seth Bake at [facility]- no beds. Message left for [place] at [facility]. Message also left for [place] [phone] to assess for Family care home.   Expected Discharge Plan: [place] Barriers to Discharge: Continued Medical Work up  Expected Discharge Plan and Services Expected Discharge Plan: [place]   Discharge Planning Services: CM Consult   Living arrangements for the past 2 months: Single Family Home                                       Social Determinants of Health (SDOH) Interventions    Readmission Risk Interventions No flowsheet data found.

## 2018-09-01 NOTE — TOC Transition Note (Signed)
Transition of Care [facility]) - CM/SW Discharge Note   Patient Details  Name: Kelly Romero MRN: [id number] Date of Birth: 06/06/1965  Transition of Care [facility]) CM/SW Contact:  Fredric Mare, LCSW Phone Number: 09/01/2018, 4:10 PM   Clinical Narrative:    Patient will be going to the [facility] [place].  Bed: 204B  # for report: [phone]  Patient and patient's daughter were notified.    Final next level of care: Skilled Nursing Facility Barriers to Discharge: Barriers Resolved   Patient Goals and CMS Choice   CMS Medicare.gov Compare Post Acute Care list provided to:: Patient Choice offered to / list presented to : Patient  Discharge Placement                Patient to be transferred to facility by: EMS Name of family member notified: [name] Patient and family notified of of transfer: 09/01/18  Discharge Plan and Services   Discharge Planning Services: CM Consult                                 Social Determinants of Health (SDOH) Interventions     Readmission Risk Interventions No flowsheet data found.

## 2018-09-01 NOTE — Discharge Instructions (Addendum)
Kelly Romero was seen in the emergency department due to poor social situation and inability to care for herself at home.  She has a history of stroke.  Lab work in the emergency department did show possible UTI and dehydration, for which she was started on antibiotics and given fluids.  She was seen by physical therapy and Occupational Therapy, who recommended skilled nursing facility placement for rehab and reconditioning.  Patient has been otherwise hemodynamically stable in the emergency department.  She will be started on cephalexin 500 mg 3 times a day for a total of 10 days.  Otherwise, her home medications will be continued at discharge.  See medication reconciliation.  No controlled medications were prescribed.

## 2018-09-01 NOTE — ED Notes (Signed)
Pt resting quietly. No signs of distress.  

## 2018-09-01 NOTE — Care Management (Signed)
ED RN Annie Main was able to speak with [place] at [facility] notifying that EMS in en route to their facility with patient. Report given. [place] requesting discharge notes and FL2 to be faxed again to [phone]. THis has again been faxed to [place]'s attention.

## 2018-09-02 DIAGNOSIS — N390 Urinary tract infection, site not specified: Secondary | ICD-10-CM | POA: Diagnosis not present

## 2018-09-02 DIAGNOSIS — I10 Essential (primary) hypertension: Secondary | ICD-10-CM | POA: Diagnosis not present

## 2018-09-02 DIAGNOSIS — I639 Cerebral infarction, unspecified: Secondary | ICD-10-CM | POA: Diagnosis not present

## 2018-09-02 DIAGNOSIS — E1165 Type 2 diabetes mellitus with hyperglycemia: Secondary | ICD-10-CM | POA: Diagnosis not present

## 2018-09-06 DIAGNOSIS — Z91040 Latex allergy status: Secondary | ICD-10-CM | POA: Diagnosis not present

## 2018-09-06 DIAGNOSIS — Z881 Allergy status to other antibiotic agents status: Secondary | ICD-10-CM | POA: Diagnosis not present

## 2018-09-06 DIAGNOSIS — Z8673 Personal history of transient ischemic attack (TIA), and cerebral infarction without residual deficits: Secondary | ICD-10-CM | POA: Diagnosis not present

## 2018-09-06 DIAGNOSIS — G43909 Migraine, unspecified, not intractable, without status migrainosus: Secondary | ICD-10-CM | POA: Diagnosis not present

## 2018-09-06 DIAGNOSIS — I10 Essential (primary) hypertension: Secondary | ICD-10-CM | POA: Diagnosis not present

## 2018-09-06 DIAGNOSIS — Z888 Allergy status to other drugs, medicaments and biological substances status: Secondary | ICD-10-CM | POA: Diagnosis not present

## 2018-09-08 DIAGNOSIS — I639 Cerebral infarction, unspecified: Secondary | ICD-10-CM | POA: Diagnosis not present

## 2018-09-08 DIAGNOSIS — I82409 Acute embolism and thrombosis of unspecified deep veins of unspecified lower extremity: Secondary | ICD-10-CM | POA: Diagnosis not present

## 2018-09-08 DIAGNOSIS — I10 Essential (primary) hypertension: Secondary | ICD-10-CM | POA: Diagnosis not present

## 2018-09-08 DIAGNOSIS — E1165 Type 2 diabetes mellitus with hyperglycemia: Secondary | ICD-10-CM | POA: Diagnosis not present

## 2018-09-09 DIAGNOSIS — R51 Headache: Secondary | ICD-10-CM | POA: Diagnosis not present

## 2018-09-09 DIAGNOSIS — I639 Cerebral infarction, unspecified: Secondary | ICD-10-CM | POA: Diagnosis not present

## 2018-09-09 DIAGNOSIS — E1165 Type 2 diabetes mellitus with hyperglycemia: Secondary | ICD-10-CM | POA: Diagnosis not present

## 2018-09-09 DIAGNOSIS — I10 Essential (primary) hypertension: Secondary | ICD-10-CM | POA: Diagnosis not present

## 2018-09-12 ENCOUNTER — Ambulatory Visit (INDEPENDENT_AMBULATORY_CARE_PROVIDER_SITE_OTHER): Payer: Medicare Other | Admitting: *Deleted

## 2018-09-12 DIAGNOSIS — I639 Cerebral infarction, unspecified: Secondary | ICD-10-CM | POA: Diagnosis not present

## 2018-09-13 LAB — CUP PACEART REMOTE DEVICE CHECK
Date Time Interrogation Session: 20200630003900
Implantable Pulse Generator Implant Date: 20190430

## 2018-09-22 NOTE — Progress Notes (Signed)
Carelink Summary Report / Loop Recorder 

## 2018-09-26 DIAGNOSIS — G4700 Insomnia, unspecified: Secondary | ICD-10-CM | POA: Diagnosis not present

## 2018-09-26 DIAGNOSIS — F329 Major depressive disorder, single episode, unspecified: Secondary | ICD-10-CM | POA: Diagnosis not present

## 2018-09-26 DIAGNOSIS — F419 Anxiety disorder, unspecified: Secondary | ICD-10-CM | POA: Diagnosis not present

## 2018-09-29 DIAGNOSIS — I10 Essential (primary) hypertension: Secondary | ICD-10-CM | POA: Diagnosis not present

## 2018-09-29 DIAGNOSIS — E1165 Type 2 diabetes mellitus with hyperglycemia: Secondary | ICD-10-CM | POA: Diagnosis not present

## 2018-09-29 DIAGNOSIS — I639 Cerebral infarction, unspecified: Secondary | ICD-10-CM | POA: Diagnosis not present

## 2018-09-29 DIAGNOSIS — K5900 Constipation, unspecified: Secondary | ICD-10-CM | POA: Diagnosis not present

## 2018-10-05 DIAGNOSIS — E1165 Type 2 diabetes mellitus with hyperglycemia: Secondary | ICD-10-CM | POA: Diagnosis not present

## 2018-10-05 DIAGNOSIS — M79675 Pain in left toe(s): Secondary | ICD-10-CM | POA: Diagnosis not present

## 2018-10-05 DIAGNOSIS — B351 Tinea unguium: Secondary | ICD-10-CM | POA: Diagnosis not present

## 2018-10-05 DIAGNOSIS — M2042 Other hammer toe(s) (acquired), left foot: Secondary | ICD-10-CM | POA: Diagnosis not present

## 2018-10-06 DIAGNOSIS — I10 Essential (primary) hypertension: Secondary | ICD-10-CM | POA: Diagnosis not present

## 2018-10-06 DIAGNOSIS — N179 Acute kidney failure, unspecified: Secondary | ICD-10-CM | POA: Diagnosis not present

## 2018-10-06 DIAGNOSIS — E1165 Type 2 diabetes mellitus with hyperglycemia: Secondary | ICD-10-CM | POA: Diagnosis not present

## 2018-10-06 DIAGNOSIS — I639 Cerebral infarction, unspecified: Secondary | ICD-10-CM | POA: Diagnosis not present

## 2018-10-16 LAB — CUP PACEART REMOTE DEVICE CHECK
Date Time Interrogation Session: 20200802014032
Implantable Pulse Generator Implant Date: 20190430

## 2018-10-17 ENCOUNTER — Ambulatory Visit (INDEPENDENT_AMBULATORY_CARE_PROVIDER_SITE_OTHER): Payer: Medicare Other | Admitting: *Deleted

## 2018-10-17 DIAGNOSIS — I639 Cerebral infarction, unspecified: Secondary | ICD-10-CM

## 2018-10-26 NOTE — Progress Notes (Signed)
Carelink Summary Report / Loop Recorder 

## 2018-11-02 DIAGNOSIS — I10 Essential (primary) hypertension: Secondary | ICD-10-CM | POA: Diagnosis not present

## 2018-11-02 DIAGNOSIS — I639 Cerebral infarction, unspecified: Secondary | ICD-10-CM | POA: Diagnosis not present

## 2018-11-02 DIAGNOSIS — R1310 Dysphagia, unspecified: Secondary | ICD-10-CM | POA: Diagnosis not present

## 2018-11-02 DIAGNOSIS — E1165 Type 2 diabetes mellitus with hyperglycemia: Secondary | ICD-10-CM | POA: Diagnosis not present

## 2018-11-03 DIAGNOSIS — I82409 Acute embolism and thrombosis of unspecified deep veins of unspecified lower extremity: Secondary | ICD-10-CM | POA: Diagnosis not present

## 2018-11-03 DIAGNOSIS — E1165 Type 2 diabetes mellitus with hyperglycemia: Secondary | ICD-10-CM | POA: Diagnosis not present

## 2018-11-03 DIAGNOSIS — I639 Cerebral infarction, unspecified: Secondary | ICD-10-CM | POA: Diagnosis not present

## 2018-11-03 DIAGNOSIS — I10 Essential (primary) hypertension: Secondary | ICD-10-CM | POA: Diagnosis not present

## 2018-11-07 DIAGNOSIS — F329 Major depressive disorder, single episode, unspecified: Secondary | ICD-10-CM | POA: Diagnosis not present

## 2018-11-07 DIAGNOSIS — F419 Anxiety disorder, unspecified: Secondary | ICD-10-CM | POA: Diagnosis not present

## 2018-11-07 DIAGNOSIS — F0150 Vascular dementia without behavioral disturbance: Secondary | ICD-10-CM | POA: Diagnosis not present

## 2018-11-17 ENCOUNTER — Ambulatory Visit (INDEPENDENT_AMBULATORY_CARE_PROVIDER_SITE_OTHER): Payer: Medicare Other | Admitting: *Deleted

## 2018-11-17 DIAGNOSIS — I639 Cerebral infarction, unspecified: Secondary | ICD-10-CM

## 2018-11-18 LAB — CUP PACEART REMOTE DEVICE CHECK
Date Time Interrogation Session: 20200904014048
Implantable Pulse Generator Implant Date: 20190430

## 2018-11-22 DIAGNOSIS — Z20828 Contact with and (suspected) exposure to other viral communicable diseases: Secondary | ICD-10-CM | POA: Diagnosis not present

## 2018-11-24 DIAGNOSIS — F419 Anxiety disorder, unspecified: Secondary | ICD-10-CM | POA: Diagnosis not present

## 2018-11-24 DIAGNOSIS — F329 Major depressive disorder, single episode, unspecified: Secondary | ICD-10-CM | POA: Diagnosis not present

## 2018-11-24 DIAGNOSIS — M25561 Pain in right knee: Secondary | ICD-10-CM | POA: Diagnosis not present

## 2018-11-24 DIAGNOSIS — M25562 Pain in left knee: Secondary | ICD-10-CM | POA: Diagnosis not present

## 2018-11-26 DIAGNOSIS — Z91040 Latex allergy status: Secondary | ICD-10-CM | POA: Diagnosis not present

## 2018-11-26 DIAGNOSIS — R072 Precordial pain: Secondary | ICD-10-CM | POA: Diagnosis not present

## 2018-11-26 DIAGNOSIS — Z794 Long term (current) use of insulin: Secondary | ICD-10-CM | POA: Diagnosis not present

## 2018-11-26 DIAGNOSIS — Z888 Allergy status to other drugs, medicaments and biological substances status: Secondary | ICD-10-CM | POA: Diagnosis not present

## 2018-11-26 DIAGNOSIS — R079 Chest pain, unspecified: Secondary | ICD-10-CM | POA: Diagnosis not present

## 2018-11-26 DIAGNOSIS — R296 Repeated falls: Secondary | ICD-10-CM | POA: Diagnosis not present

## 2018-11-26 DIAGNOSIS — R0789 Other chest pain: Secondary | ICD-10-CM | POA: Diagnosis not present

## 2018-11-26 DIAGNOSIS — I10 Essential (primary) hypertension: Secondary | ICD-10-CM | POA: Diagnosis not present

## 2018-11-26 DIAGNOSIS — E119 Type 2 diabetes mellitus without complications: Secondary | ICD-10-CM | POA: Diagnosis not present

## 2018-11-26 DIAGNOSIS — E1165 Type 2 diabetes mellitus with hyperglycemia: Secondary | ICD-10-CM | POA: Diagnosis not present

## 2018-11-26 DIAGNOSIS — Z881 Allergy status to other antibiotic agents status: Secondary | ICD-10-CM | POA: Diagnosis not present

## 2018-11-28 DIAGNOSIS — E1165 Type 2 diabetes mellitus with hyperglycemia: Secondary | ICD-10-CM | POA: Diagnosis not present

## 2018-11-28 DIAGNOSIS — M25561 Pain in right knee: Secondary | ICD-10-CM | POA: Diagnosis not present

## 2018-11-28 DIAGNOSIS — D649 Anemia, unspecified: Secondary | ICD-10-CM | POA: Diagnosis not present

## 2018-11-28 DIAGNOSIS — R079 Chest pain, unspecified: Secondary | ICD-10-CM | POA: Diagnosis not present

## 2018-11-29 DIAGNOSIS — Z20828 Contact with and (suspected) exposure to other viral communicable diseases: Secondary | ICD-10-CM | POA: Diagnosis not present

## 2018-12-01 DIAGNOSIS — I10 Essential (primary) hypertension: Secondary | ICD-10-CM | POA: Diagnosis not present

## 2018-12-01 DIAGNOSIS — E1165 Type 2 diabetes mellitus with hyperglycemia: Secondary | ICD-10-CM | POA: Diagnosis not present

## 2018-12-01 DIAGNOSIS — I82409 Acute embolism and thrombosis of unspecified deep veins of unspecified lower extremity: Secondary | ICD-10-CM | POA: Diagnosis not present

## 2018-12-01 DIAGNOSIS — G9341 Metabolic encephalopathy: Secondary | ICD-10-CM | POA: Diagnosis not present

## 2018-12-01 NOTE — Progress Notes (Signed)
Carelink Summary Report / Loop Recorder 

## 2018-12-02 DIAGNOSIS — F329 Major depressive disorder, single episode, unspecified: Secondary | ICD-10-CM | POA: Diagnosis not present

## 2018-12-02 DIAGNOSIS — F319 Bipolar disorder, unspecified: Secondary | ICD-10-CM | POA: Diagnosis not present

## 2018-12-02 DIAGNOSIS — F419 Anxiety disorder, unspecified: Secondary | ICD-10-CM | POA: Diagnosis not present

## 2018-12-05 ENCOUNTER — Telehealth: Payer: Medicare Other | Admitting: Cardiology

## 2018-12-08 DIAGNOSIS — F329 Major depressive disorder, single episode, unspecified: Secondary | ICD-10-CM | POA: Diagnosis not present

## 2018-12-08 DIAGNOSIS — F319 Bipolar disorder, unspecified: Secondary | ICD-10-CM | POA: Diagnosis not present

## 2018-12-08 DIAGNOSIS — F419 Anxiety disorder, unspecified: Secondary | ICD-10-CM | POA: Diagnosis not present

## 2018-12-09 DIAGNOSIS — E785 Hyperlipidemia, unspecified: Secondary | ICD-10-CM | POA: Diagnosis not present

## 2018-12-09 DIAGNOSIS — R569 Unspecified convulsions: Secondary | ICD-10-CM | POA: Diagnosis not present

## 2018-12-09 DIAGNOSIS — E119 Type 2 diabetes mellitus without complications: Secondary | ICD-10-CM | POA: Diagnosis not present

## 2018-12-09 DIAGNOSIS — E7800 Pure hypercholesterolemia, unspecified: Secondary | ICD-10-CM | POA: Diagnosis not present

## 2018-12-09 DIAGNOSIS — E559 Vitamin D deficiency, unspecified: Secondary | ICD-10-CM | POA: Diagnosis not present

## 2018-12-09 DIAGNOSIS — E039 Hypothyroidism, unspecified: Secondary | ICD-10-CM | POA: Diagnosis not present

## 2018-12-09 DIAGNOSIS — D649 Anemia, unspecified: Secondary | ICD-10-CM | POA: Diagnosis not present

## 2018-12-12 DIAGNOSIS — M1711 Unilateral primary osteoarthritis, right knee: Secondary | ICD-10-CM | POA: Diagnosis not present

## 2018-12-12 DIAGNOSIS — M1712 Unilateral primary osteoarthritis, left knee: Secondary | ICD-10-CM | POA: Diagnosis not present

## 2018-12-14 DIAGNOSIS — M25561 Pain in right knee: Secondary | ICD-10-CM | POA: Diagnosis not present

## 2018-12-14 DIAGNOSIS — M25562 Pain in left knee: Secondary | ICD-10-CM | POA: Diagnosis not present

## 2018-12-15 DIAGNOSIS — M6281 Muscle weakness (generalized): Secondary | ICD-10-CM | POA: Diagnosis not present

## 2018-12-15 DIAGNOSIS — E1140 Type 2 diabetes mellitus with diabetic neuropathy, unspecified: Secondary | ICD-10-CM | POA: Diagnosis not present

## 2018-12-15 DIAGNOSIS — I10 Essential (primary) hypertension: Secondary | ICD-10-CM | POA: Diagnosis not present

## 2018-12-15 DIAGNOSIS — F319 Bipolar disorder, unspecified: Secondary | ICD-10-CM | POA: Diagnosis not present

## 2018-12-15 DIAGNOSIS — R279 Unspecified lack of coordination: Secondary | ICD-10-CM | POA: Diagnosis not present

## 2018-12-15 DIAGNOSIS — I69320 Aphasia following cerebral infarction: Secondary | ICD-10-CM | POA: Diagnosis not present

## 2018-12-15 DIAGNOSIS — F419 Anxiety disorder, unspecified: Secondary | ICD-10-CM | POA: Diagnosis not present

## 2018-12-15 DIAGNOSIS — I69390 Apraxia following cerebral infarction: Secondary | ICD-10-CM | POA: Diagnosis not present

## 2018-12-15 DIAGNOSIS — R482 Apraxia: Secondary | ICD-10-CM | POA: Diagnosis not present

## 2018-12-15 DIAGNOSIS — R2681 Unsteadiness on feet: Secondary | ICD-10-CM | POA: Diagnosis not present

## 2018-12-15 DIAGNOSIS — R262 Difficulty in walking, not elsewhere classified: Secondary | ICD-10-CM | POA: Diagnosis not present

## 2018-12-15 DIAGNOSIS — E785 Hyperlipidemia, unspecified: Secondary | ICD-10-CM | POA: Diagnosis not present

## 2018-12-15 DIAGNOSIS — I69391 Dysphagia following cerebral infarction: Secondary | ICD-10-CM | POA: Diagnosis not present

## 2018-12-15 DIAGNOSIS — I639 Cerebral infarction, unspecified: Secondary | ICD-10-CM | POA: Diagnosis not present

## 2018-12-15 DIAGNOSIS — I82409 Acute embolism and thrombosis of unspecified deep veins of unspecified lower extremity: Secondary | ICD-10-CM | POA: Diagnosis not present

## 2018-12-15 DIAGNOSIS — E8809 Other disorders of plasma-protein metabolism, not elsewhere classified: Secondary | ICD-10-CM | POA: Diagnosis not present

## 2018-12-15 DIAGNOSIS — Z741 Need for assistance with personal care: Secondary | ICD-10-CM | POA: Diagnosis not present

## 2018-12-15 DIAGNOSIS — E1165 Type 2 diabetes mellitus with hyperglycemia: Secondary | ICD-10-CM | POA: Diagnosis not present

## 2018-12-15 DIAGNOSIS — F0390 Unspecified dementia without behavioral disturbance: Secondary | ICD-10-CM | POA: Diagnosis not present

## 2018-12-15 DIAGNOSIS — D500 Iron deficiency anemia secondary to blood loss (chronic): Secondary | ICD-10-CM | POA: Diagnosis not present

## 2018-12-15 DIAGNOSIS — R1312 Dysphagia, oropharyngeal phase: Secondary | ICD-10-CM | POA: Diagnosis not present

## 2018-12-15 DIAGNOSIS — F329 Major depressive disorder, single episode, unspecified: Secondary | ICD-10-CM | POA: Diagnosis not present

## 2018-12-15 DIAGNOSIS — F339 Major depressive disorder, recurrent, unspecified: Secondary | ICD-10-CM | POA: Diagnosis not present

## 2018-12-15 DIAGNOSIS — I69851 Hemiplegia and hemiparesis following other cerebrovascular disease affecting right dominant side: Secondary | ICD-10-CM | POA: Diagnosis not present

## 2018-12-15 DIAGNOSIS — N390 Urinary tract infection, site not specified: Secondary | ICD-10-CM | POA: Diagnosis not present

## 2018-12-15 DIAGNOSIS — R519 Headache, unspecified: Secondary | ICD-10-CM | POA: Diagnosis not present

## 2018-12-15 DIAGNOSIS — Z23 Encounter for immunization: Secondary | ICD-10-CM | POA: Diagnosis not present

## 2018-12-15 DIAGNOSIS — I6930 Unspecified sequelae of cerebral infarction: Secondary | ICD-10-CM | POA: Diagnosis not present

## 2018-12-15 DIAGNOSIS — E46 Unspecified protein-calorie malnutrition: Secondary | ICD-10-CM | POA: Diagnosis not present

## 2018-12-20 ENCOUNTER — Ambulatory Visit (INDEPENDENT_AMBULATORY_CARE_PROVIDER_SITE_OTHER): Payer: Medicare Other | Admitting: *Deleted

## 2018-12-20 DIAGNOSIS — I639 Cerebral infarction, unspecified: Secondary | ICD-10-CM | POA: Diagnosis not present

## 2018-12-21 LAB — CUP PACEART REMOTE DEVICE CHECK
Date Time Interrogation Session: 20201007013913
Implantable Pulse Generator Implant Date: 20190430

## 2018-12-28 DIAGNOSIS — E1165 Type 2 diabetes mellitus with hyperglycemia: Secondary | ICD-10-CM | POA: Diagnosis not present

## 2018-12-28 DIAGNOSIS — I10 Essential (primary) hypertension: Secondary | ICD-10-CM | POA: Diagnosis not present

## 2018-12-29 NOTE — Progress Notes (Signed)
Carelink Summary Report / Loop Recorder 

## 2019-01-03 DIAGNOSIS — N390 Urinary tract infection, site not specified: Secondary | ICD-10-CM | POA: Diagnosis not present

## 2019-01-05 DIAGNOSIS — E1165 Type 2 diabetes mellitus with hyperglycemia: Secondary | ICD-10-CM | POA: Diagnosis not present

## 2019-01-05 DIAGNOSIS — I10 Essential (primary) hypertension: Secondary | ICD-10-CM | POA: Diagnosis not present

## 2019-01-05 DIAGNOSIS — I639 Cerebral infarction, unspecified: Secondary | ICD-10-CM | POA: Diagnosis not present

## 2019-01-05 DIAGNOSIS — I82409 Acute embolism and thrombosis of unspecified deep veins of unspecified lower extremity: Secondary | ICD-10-CM | POA: Diagnosis not present

## 2019-01-16 DIAGNOSIS — F419 Anxiety disorder, unspecified: Secondary | ICD-10-CM | POA: Diagnosis not present

## 2019-01-16 DIAGNOSIS — G4700 Insomnia, unspecified: Secondary | ICD-10-CM | POA: Diagnosis not present

## 2019-01-16 DIAGNOSIS — F330 Major depressive disorder, recurrent, mild: Secondary | ICD-10-CM | POA: Diagnosis not present

## 2019-01-19 NOTE — Progress Notes (Deleted)
NEUROLOGY FOLLOW UP OFFICE NOTE  KHAMIYAH GREFE [id number]  HISTORY OF PRESENT ILLNESS: Kelly Romero is a 53 year old right-handed Caucasian woman with diabetes, hypertension, orthostatic hypotension and depression who follows up for recurrent cryptogenic strokes.  History supplemented by her husband and cardiology notes.  I STROKE: Update: Current medications: Plavix 75 mg daily, Crestor40mg  She is currently with implantable loop recorder.No significant arrhythmia has yet been identified.   Blood sugars were running high last night. They are better now. Appetite is not as good as it used to be but she eats what she wants.  She continues to deal with orthostatic hypotension.  She wears compression stockings and is trying to increase fluid intake.    Swallowing has improved and no longer needs PEG.  It was removed on Friday.  She has had increase in depression even since last visit.  History: She was in [facility] on 12/07/14 for stroke symptoms.She developed slurred speech and right arm numbness.She was agitated, yelling and screaming in the ED.She was given Ativan and Haldol.CT of head was unremarkable.Repeat CT of head two days later was negative.CXR negative.Urine tox screen was positive for opioids.She was found to have severally elevated blood pressure with systolic at 240 and higher.Hgb A1c was 12 with serum glucose level of 400.She did not have fever or leukocytosis, and UA was borderline with trace leukocytes but negative nitrite.She was treated with 5 days of IV Rocephin.Troponins were negative.Etiology was unknown but TIA was suspected as a possibility.  She had a similar stroke-like episode on 02/22/15. She woke up with headache and later developed slurred speech. She was admitted to [facility], where MRI of brain revealed 2 mm left occipital subacute infarct. 2D echo was performed, which appeared unremarkable, but  didn't make mention of a PFO. Carotid doppler again showed no hemodynamically significant ICA stenosis. Hypercoagulable panel, ANA, lupus panel, antiextractable nuclear antigen were negative. LDL was 130. Plavix was added to her ASA. Crestor was increased to 40mg  daily. Labs from August 2016 include Sed Rate 30, ANA negative, ANCA screen negative, antiextractable nuclear antigen (RNP abs, Smith abs) negative. She had a TEE on 04/19/15, which revealed EF 60-65% with no PFO or atrial septal defect or other cardiac source of emboli. After 3 months on dual antiplatelet therapy, she was continued on Plavix alone. She was on amitriptyline 25mg  at bedtime to help with depression and headache.  She was admitted to [facility] between 08/15/15 and 08/19/15 for right-sided (however some notes state left-sided) weakness and numbness associated with forgetfulness and agitation. Blood pressure was elevated. NIHSS was 4. Due to minimal symptoms, she did not receive tPA. Urine tox screen was negative. CT of head showed no acute findings. MRI of brain with and without contrast revealed possible small acute infarct in the posterior right MCA territory. However, it appears on previous imaging, which suggests it is likely an old finding. Neurology believed it to be an old infarct, as it apparently was seen on a prior MRI in February 2017. TTE again was unremarkable with EF of 65-70%. LDL was 231. Hgb A1c was 10.5. Exact cause of encephalopathy was unclear. She underwent an EEG on 08/18/15, which revealed mild to moderate generalized background slowing but no epileptiform activity. Neurology felt symptoms may be psychogenic. She was started on Depakote 500mg  twice daily for possible seizures. She was maintained on ASA 81mg  and Plavix, as well as Rosuvastatin 20mg . She reports word-finding difficulties, which has been ongoing for several  months.  She was readmitted to [facility] from 12/26/15 to 12/22/2015 for  altered mental status, fever of 103 and elevated blood pressure with systolic in 220s. MRI of brain revealed small acute and subacute infarcts in the bilateral cerebral hemisphere. Due to fever, she was started on IV antibiotics and acyclovir. UA and urine tox screen was negative. Blood cultures showed 1 of 2 positive for gram +, likely contaminated. Endocarditis was not suspected. She underwent LP. CSF cell count was 1, gram stain negative, protein 49, glucose 98, oligoclonal bands negative, fungal cultures, CMV, VZV, VDRL and HSV were negative. In the serum, HIV and RPR were nonreactive. Arbovirus panel was negative. ACE was 26. Hypercoagulable panel (including homocysteine, lupus anticoagulant, beta-2-glycoprotein, cardiolipin antibodies) was negative. TTE showed normal LVEF 55-60% and TCD showed clinically insignificant right to left shunt with valsalva only. Cerebral angiogram revealed mild athersclerosis with 65-70% stenosis of left MCA superior division, severe stenosis of proximal PCA P1 segment and tapered stenosis of distal right pericallosal artery. Hgb A1c was 13. LDL was 153. Lower extremity doppler revealed distal DVT in the right lower extremity. Plavix was switched to ASA  daily. DVT is being monitored and managed by her PCP. Recommendation was to recheck venous ultrasound every 2 weeks and if there is any proximal extension, then the switch to anticoagulation. 30 day Holter monitor from late 2017 revealed no arrhythmia.  She was admitted to [facility] from on 10/02/16 for headache, dizziness and hypertension. CT of head from 09/30/16 showed a subacute to chronic infarct in the left parietooccipital junction. CTA of head from 10/01/16 demonstrated high-grade stenosis or occlusion at the left P2 segment bifurcation with nonvisualization of the superior left P3 segment. MRI of brain without contrast from 10/02/16 demonstrated a small acute infarct in the right body of  the corpus callosum. Carotid doppler showed moderate heterogeneous and calcified plaque without hemodynamically significant stenosis. 2D echo showed EF 60-65% with no PFO or other cardiac source of emboli. Telemetry revealed no atrial fibrillation. LDL was reportedly 239. Rosuvastatin  was changed to atorvastatin . She was maintained on ASA  and Plavix . She was also treated for UTI.  She had a nuclear stress test on 11/26/16 which was normal with EF 55-65%. For uncontrolled hypertension, she had a renal ultrasound on 11/25/16, which was normal.  She was admitted to [facility] in [place] from 04/27/17 to 04/30/17 for stroke. She presented with one week history of intermittent slurred speech. CT of head showed no acute abnormalities. CTA of head showed multiple stable segments of mild to moderate stenosis involving the ACA, MCA and PCA distributions as well as stable severe stenosis of left M2. Compared to prior CTA from July 2018, she demonstrated interval occlusion of right PCA distal calcarine branch. CTA of neck showed bilateral 60% proximal ICA stenosis. MRI of brain with and without contrast showed multiple subacute infarcts involving the the right lateral lenticulostriate territory, right parietal region and cortical cerebral hemispheres. TTE showed EF 65-70% with negative bubble study. LDL was 347. Hgb A1c was 13.6. She reportedly had been noncompliant with her medications. She had stopped taking her medication for a month due to depression. She is back on all of her medications. It was believed that her strokes were secondary to intracranial large vessel disease rather than cardiac source.   She was admitted to [facility] from 07/08/17 to 07/10/17 for slurred speech and right facial droop. MRI of brain showed small acute infarcts in the left  hemisphere. Carotid doppler showed no hemodynamically significant stenosis. Echocardiogram showed normal EF  with no evidence of PFO. LDL was 72. A1c was 7.3. She was advised to continue ASA and Plavix and Crestor 40mg . She was [facility] from 07/11/17 to 07/13/17 for another stroke. She presented with worsening dysarthria and new right facial and right upper extremity numbness. Dr. Pearlean BrownieSethi happened to be the hospital physician. CT head showed stable extensive chronic small vessel disease but no acute findings. MRI of brain showed increased size of acute infarct within left corona radiate and caudate body, interval infarction of the penumbra as well as stable scattered infarcts from recent stroke.Marland Kitchen. She was continued on dual antiplatelet therapy for 3 months, however she has only been on the Plavix alone.Due to dysphagia, she required a PEG insertion. While in rehab, she had syncopal episodes due to orthostatic hypotension. Her blood pressure fluctuates from 90s/60s to 150s systolic. She had an implantable loop recorder inserted.  Repeat modified barium swallow from 09/28/2017 revealed improved swallowing function.  II VASCULAR DEMENTIA/APHASIA: She underwent neuropsychological testing on 03/10/16. She exhibited adjustment disorder with depression and anxiety. However, a cognitive diagnosis could not be made due to poor effort on testing. It was recommended to continue treating anxiety and depression.  III HEADACHES: Update: Intensity:  Severe Duration:  A hour or two Frequency:  Once a week Current NSAIDS: no Current analgesics: Ibuprofen, Tylenol Current triptans: no Current anti-emetic: no Current muscle relaxants: no Current anti-anxiolytic: no Current sleep aide: no Current Antihypertensive medications: lisinopril Current Antidepressant medications: escitalopram 20mg  Current Anticonvulsant medications: divalproex DR250mg  twice daily Current Vitamins/Herbal/Supplements: Magnesium citrate 400mg  Current Antihistamines/Decongestants: no Other therapy:  No  History: Onset:  July 2016.Sudden onset. Location:Right sided, from front to back of head.Initially had pain behind right ear. Quality:squeezing Initial intensity:7-8/10 Aura:no Prodrome:no Associated symptoms:Some photophobia and phonophobia.Sometimes nausea.She denies visual disturbance. Initial Duration:All day Initial Frequency:Daily.Sometimes wakes her up at night Triggers:  Loud noise Relieving factors:Laying in a cool dark quiet room  Past abortive medication:Tylenol, Excedrin, Percocet, ibuprofen 800mg  Past preventative medication:amitriptyline 25mg  Other past therapy:none  Sed Rate was 30. MRI of the brain from 11/09/14 showed chronic small vessel ischemic changes with an incidental tiny subacute infarct in the right frontal lobe.  PAST MEDICAL HISTORY: Past Medical History:  Diagnosis Date   Diabetes mellitus without complication (HCC)    Headache    Hyperlipidemia    Hypertension    Stroke (HCC)    TIA (transient ischemic attack)     MEDICATIONS: Current Outpatient Medications on File Prior to Visit  Medication Sig Dispense Refill   cephALEXin (KEFLEX) 500 MG capsule Take 1 capsule (500 mg total) by mouth 3 (three) times daily. 30 capsule 0   clopidogrel (PLAVIX) 75 MG tablet Take 1 tablet (75 mg total) by mouth daily. 30 tablet 0   divalproex (DEPAKOTE) 250 MG DR tablet Take 1 tablet (250 mg total) by mouth 2 (two) times daily. 60 tablet 0   escitalopram (LEXAPRO) 20 MG tablet Take 1 tablet (20 mg total) by mouth daily. 30 tablet 5   insulin detemir (LEVEMIR) 100 UNIT/ML injection Inject 0.2 mLs (20 Units total) into the skin daily before breakfast. (Patient taking differently: Inject 20 Units into the skin daily. ) 10 mL 11   lisinopril (ZESTRIL) 5 MG tablet Take 5 mg by mouth daily.      metoprolol tartrate (LOPRESSOR) 25 MG tablet Take 0.5 tablets (12.5 mg total) by mouth 2 (two) times daily.  30 tablet 0     polyethylene glycol (MIRALAX / GLYCOLAX) packet May take 17 g with 8 oz water per peg tube daily prn 30 each 0   rosuvastatin (CRESTOR) 40 MG tablet Take 1 tablet (40 mg total) by mouth at bedtime. 30 tablet 0   No current facility-administered medications on file prior to visit.     ALLERGIES: Allergies  Allergen Reactions   Erythromycin Itching   Latex Itching    FAMILY HISTORY: Family History  Problem Relation Age of Onset   Stroke Father        51s   Heart attack Father 71   COPD Mother    Heart failure Brother    Cancer Maternal Grandmother        unknown    COPD Other    Heart failure Maternal Grandfather    Hypertension Maternal Grandfather    Cancer Paternal Grandfather        lung   Diabetes Paternal Grandmother     SOCIAL HISTORY: Social History   Socioeconomic History   Marital status: Married    Spouse name: Not on file   Number of children: Not on file   Years of education: Not on file   Highest education level: Not on file  Occupational History   Occupation: Data processing manager  Social Needs   Financial resource strain: Not on file   Food insecurity    Worry: Not on file    Inability: Not on file   Transportation needs    Medical: Not on file    Non-medical: Not on file  Tobacco Use   Smoking status: Never Smoker   Smokeless tobacco: Never Used  Substance and Sexual Activity   Alcohol use: No    Alcohol/week: 0.0 standard drinks    Comment: rare    Drug use: No   Sexual activity: Not Currently  Lifestyle   Physical activity    Days per week: Not on file    Minutes per session: Not on file   Stress: Not on file  Relationships   Social connections    Talks on phone: Not on file    Gets together: Not on file    Attends religious service: Not on file    Active member of club or organization: Not on file    Attends meetings of clubs or organizations: Not on file    Relationship status: Not on file    Intimate partner violence    Fear of current or ex partner: Not on file    Emotionally abused: Not on file    Physically abused: Not on file    Forced sexual activity: Not on file  Other Topics Concern   Not on file  Social History Narrative   Not on file    REVIEW OF SYSTEMS: Constitutional: No fevers, chills, or sweats, no generalized fatigue, change in appetite Eyes: No visual changes, double vision, eye pain Ear, nose and throat: No hearing loss, ear pain, nasal congestion, sore throat Cardiovascular: No chest pain, palpitations Respiratory:  No shortness of breath at rest or with exertion, wheezes GastrointestinaI: No nausea, vomiting, diarrhea, abdominal pain, fecal incontinence Genitourinary:  No dysuria, urinary retention or frequency Musculoskeletal:  No neck pain, back pain Integumentary: No rash, pruritus, skin lesions Neurological: as above Psychiatric: No depression, insomnia, anxiety Endocrine: No palpitations, fatigue, diaphoresis, mood swings, change in appetite, change in weight, increased thirst Hematologic/Lymphatic:  No purpura, petechiae. Allergic/Immunologic: no itchy/runny eyes, nasal congestion, recent allergic reactions,  rashes  PHYSICAL EXAM: *** General: No acute distress.  Patient appears ***-groomed.   Head:  Normocephalic/atraumatic Eyes:  Fundi examined but not visualized Neck: supple, no paraspinal tenderness, full range of motion Heart:  Regular rate and rhythm Lungs:  Clear to auscultation bilaterally Back: No paraspinal tenderness Neurological Exam: Alert and oriented to person, place, and time (except said year 2018).  Attention span and concentration mildly impaired.  Recent memory mildly impaired.  Remote memory intact.  Fund of knowledge intact.  Speech slowed and dysarthric.  Difficulty following complex commands across midline.  Otherwise, language intact.  Right homonymous hemianopsia, flattening of right nasolabial fold.  Otherwise, CN  II-XII intact.  Bulk and tone normal, muscle strength 5 -/5 right upper and lower extremities, 5/5 left upper and lower extremities.  Decreased finger tapping bilaterally but worse on right.  Sensation to light touch intact.  Deep tendon reflexes 2+ throughout.  Finger-to-nose testing intact but slowed on the right.  Nonambulatory.  IMPRESSION: 1.  Recurrent strokes, cryptogenic.  Residual cognitive impairment and right homonymous hemianopsia. She has uncontrolled diabetes, hyperlipidemia, hypertension and significant intracranial atherosclerotic disease which all could have contributed to her strokes. However, due to the multiple infarcts, I am wondering if further cardiac workup with loop recorder is needed.Other stroke workup has been unremarkable (including TTE/TEE, 30 day Holter monitor, carotid doppler and hypercoagulable panel). She had a DVT but no PFO. 2.  Vascular dementia 3.  Hypertension 4.  Orthostatic hypotension,  5.  Hyperlipidemia 6.  Migraine without aura, without status migrainosus, not intractable 7.  Tension-type headaches, not intractable 8.  Depression, anxiety, pseudobulbar affect  PLAN: 1.  Continue Plavix 75mg  daily for secondary stroke prevention 2. Continue Crestor.  3. Optimize glycemic control 4. Optimize labile blood pressure control 5. Continue divalproex DR 250mg  twice daily. Check CMP. 6. Continue escitalopram to 20mg  daily 7. Follow up in 6 months.  Metta Clines, DO  CC: Lovette Cliche II, MD

## 2019-01-20 ENCOUNTER — Ambulatory Visit: Payer: Medicare Other | Admitting: Neurology

## 2019-01-23 ENCOUNTER — Ambulatory Visit (INDEPENDENT_AMBULATORY_CARE_PROVIDER_SITE_OTHER): Payer: Medicare Other | Admitting: *Deleted

## 2019-01-23 DIAGNOSIS — Z1159 Encounter for screening for other viral diseases: Secondary | ICD-10-CM | POA: Diagnosis not present

## 2019-01-23 DIAGNOSIS — I639 Cerebral infarction, unspecified: Secondary | ICD-10-CM | POA: Diagnosis not present

## 2019-01-24 ENCOUNTER — Encounter: Payer: Self-pay | Admitting: Neurology

## 2019-01-24 LAB — CUP PACEART REMOTE DEVICE CHECK
Date Time Interrogation Session: 20201109014050
Implantable Pulse Generator Implant Date: 20190430

## 2019-01-26 DIAGNOSIS — F419 Anxiety disorder, unspecified: Secondary | ICD-10-CM | POA: Diagnosis not present

## 2019-01-26 DIAGNOSIS — F329 Major depressive disorder, single episode, unspecified: Secondary | ICD-10-CM | POA: Diagnosis not present

## 2019-01-26 DIAGNOSIS — F319 Bipolar disorder, unspecified: Secondary | ICD-10-CM | POA: Diagnosis not present

## 2019-01-31 DIAGNOSIS — I10 Essential (primary) hypertension: Secondary | ICD-10-CM | POA: Diagnosis not present

## 2019-01-31 DIAGNOSIS — E1165 Type 2 diabetes mellitus with hyperglycemia: Secondary | ICD-10-CM | POA: Diagnosis not present

## 2019-02-02 DIAGNOSIS — I639 Cerebral infarction, unspecified: Secondary | ICD-10-CM | POA: Diagnosis not present

## 2019-02-02 DIAGNOSIS — I82409 Acute embolism and thrombosis of unspecified deep veins of unspecified lower extremity: Secondary | ICD-10-CM | POA: Diagnosis not present

## 2019-02-02 DIAGNOSIS — E1165 Type 2 diabetes mellitus with hyperglycemia: Secondary | ICD-10-CM | POA: Diagnosis not present

## 2019-02-02 DIAGNOSIS — I10 Essential (primary) hypertension: Secondary | ICD-10-CM | POA: Diagnosis not present

## 2019-02-09 DIAGNOSIS — F419 Anxiety disorder, unspecified: Secondary | ICD-10-CM | POA: Diagnosis not present

## 2019-02-13 DIAGNOSIS — Z1159 Encounter for screening for other viral diseases: Secondary | ICD-10-CM | POA: Diagnosis not present

## 2019-02-21 NOTE — Progress Notes (Signed)
Carelink Summary Report / Loop Recorder 

## 2019-02-23 DIAGNOSIS — F330 Major depressive disorder, recurrent, mild: Secondary | ICD-10-CM | POA: Diagnosis not present

## 2019-02-23 DIAGNOSIS — F419 Anxiety disorder, unspecified: Secondary | ICD-10-CM | POA: Diagnosis not present

## 2019-02-24 ENCOUNTER — Ambulatory Visit (INDEPENDENT_AMBULATORY_CARE_PROVIDER_SITE_OTHER): Payer: Medicare Other | Admitting: *Deleted

## 2019-02-24 DIAGNOSIS — I6330 Cerebral infarction due to thrombosis of unspecified cerebral artery: Secondary | ICD-10-CM

## 2019-02-24 DIAGNOSIS — F330 Major depressive disorder, recurrent, mild: Secondary | ICD-10-CM | POA: Diagnosis not present

## 2019-02-24 DIAGNOSIS — G4700 Insomnia, unspecified: Secondary | ICD-10-CM | POA: Diagnosis not present

## 2019-02-26 LAB — CUP PACEART REMOTE DEVICE CHECK
Date Time Interrogation Session: 20201211204424
Implantable Pulse Generator Implant Date: 20190430

## 2019-03-12 DIAGNOSIS — R4182 Altered mental status, unspecified: Secondary | ICD-10-CM | POA: Diagnosis not present

## 2019-03-12 DIAGNOSIS — I10 Essential (primary) hypertension: Secondary | ICD-10-CM | POA: Diagnosis not present

## 2019-03-12 DIAGNOSIS — I82409 Acute embolism and thrombosis of unspecified deep veins of unspecified lower extremity: Secondary | ICD-10-CM | POA: Diagnosis not present

## 2019-03-12 DIAGNOSIS — E1165 Type 2 diabetes mellitus with hyperglycemia: Secondary | ICD-10-CM | POA: Diagnosis not present

## 2019-03-28 DIAGNOSIS — R2681 Unsteadiness on feet: Secondary | ICD-10-CM | POA: Diagnosis not present

## 2019-03-29 ENCOUNTER — Ambulatory Visit (INDEPENDENT_AMBULATORY_CARE_PROVIDER_SITE_OTHER): Payer: Medicare Other | Admitting: *Deleted

## 2019-03-29 DIAGNOSIS — I639 Cerebral infarction, unspecified: Secondary | ICD-10-CM | POA: Diagnosis not present

## 2019-03-29 DIAGNOSIS — I82409 Acute embolism and thrombosis of unspecified deep veins of unspecified lower extremity: Secondary | ICD-10-CM | POA: Diagnosis not present

## 2019-03-29 DIAGNOSIS — I10 Essential (primary) hypertension: Secondary | ICD-10-CM | POA: Diagnosis not present

## 2019-03-29 DIAGNOSIS — I6330 Cerebral infarction due to thrombosis of unspecified cerebral artery: Secondary | ICD-10-CM

## 2019-03-29 DIAGNOSIS — E1165 Type 2 diabetes mellitus with hyperglycemia: Secondary | ICD-10-CM | POA: Diagnosis not present

## 2019-03-30 DIAGNOSIS — E1165 Type 2 diabetes mellitus with hyperglycemia: Secondary | ICD-10-CM | POA: Diagnosis not present

## 2019-03-30 DIAGNOSIS — I10 Essential (primary) hypertension: Secondary | ICD-10-CM | POA: Diagnosis not present

## 2019-03-30 LAB — CUP PACEART REMOTE DEVICE CHECK
Date Time Interrogation Session: 20210113223109
Implantable Pulse Generator Implant Date: 20190430

## 2019-03-31 DIAGNOSIS — M47816 Spondylosis without myelopathy or radiculopathy, lumbar region: Secondary | ICD-10-CM | POA: Diagnosis not present

## 2019-03-31 DIAGNOSIS — M542 Cervicalgia: Secondary | ICD-10-CM | POA: Diagnosis not present

## 2019-04-03 DIAGNOSIS — I6930 Unspecified sequelae of cerebral infarction: Secondary | ICD-10-CM | POA: Diagnosis not present

## 2019-04-03 DIAGNOSIS — Z741 Need for assistance with personal care: Secondary | ICD-10-CM | POA: Diagnosis not present

## 2019-04-04 DIAGNOSIS — I6930 Unspecified sequelae of cerebral infarction: Secondary | ICD-10-CM | POA: Diagnosis not present

## 2019-04-04 DIAGNOSIS — Z741 Need for assistance with personal care: Secondary | ICD-10-CM | POA: Diagnosis not present

## 2019-04-04 DIAGNOSIS — Z23 Encounter for immunization: Secondary | ICD-10-CM | POA: Diagnosis not present

## 2019-04-05 DIAGNOSIS — I6930 Unspecified sequelae of cerebral infarction: Secondary | ICD-10-CM | POA: Diagnosis not present

## 2019-04-05 DIAGNOSIS — Z741 Need for assistance with personal care: Secondary | ICD-10-CM | POA: Diagnosis not present

## 2019-04-06 DIAGNOSIS — I6930 Unspecified sequelae of cerebral infarction: Secondary | ICD-10-CM | POA: Diagnosis not present

## 2019-04-06 DIAGNOSIS — Z741 Need for assistance with personal care: Secondary | ICD-10-CM | POA: Diagnosis not present

## 2019-04-07 DIAGNOSIS — Z741 Need for assistance with personal care: Secondary | ICD-10-CM | POA: Diagnosis not present

## 2019-04-07 DIAGNOSIS — I6930 Unspecified sequelae of cerebral infarction: Secondary | ICD-10-CM | POA: Diagnosis not present

## 2019-04-09 DIAGNOSIS — Z741 Need for assistance with personal care: Secondary | ICD-10-CM | POA: Diagnosis not present

## 2019-04-09 DIAGNOSIS — I6930 Unspecified sequelae of cerebral infarction: Secondary | ICD-10-CM | POA: Diagnosis not present

## 2019-04-10 DIAGNOSIS — I6930 Unspecified sequelae of cerebral infarction: Secondary | ICD-10-CM | POA: Diagnosis not present

## 2019-04-10 DIAGNOSIS — Z741 Need for assistance with personal care: Secondary | ICD-10-CM | POA: Diagnosis not present

## 2019-04-11 DIAGNOSIS — I6930 Unspecified sequelae of cerebral infarction: Secondary | ICD-10-CM | POA: Diagnosis not present

## 2019-04-11 DIAGNOSIS — Z741 Need for assistance with personal care: Secondary | ICD-10-CM | POA: Diagnosis not present

## 2019-04-12 DIAGNOSIS — D649 Anemia, unspecified: Secondary | ICD-10-CM | POA: Diagnosis not present

## 2019-04-12 DIAGNOSIS — I6930 Unspecified sequelae of cerebral infarction: Secondary | ICD-10-CM | POA: Diagnosis not present

## 2019-04-12 DIAGNOSIS — R1310 Dysphagia, unspecified: Secondary | ICD-10-CM | POA: Diagnosis not present

## 2019-04-12 DIAGNOSIS — Z741 Need for assistance with personal care: Secondary | ICD-10-CM | POA: Diagnosis not present

## 2019-04-12 DIAGNOSIS — E1140 Type 2 diabetes mellitus with diabetic neuropathy, unspecified: Secondary | ICD-10-CM | POA: Diagnosis not present

## 2019-04-12 DIAGNOSIS — I10 Essential (primary) hypertension: Secondary | ICD-10-CM | POA: Diagnosis not present

## 2019-04-13 DIAGNOSIS — I6930 Unspecified sequelae of cerebral infarction: Secondary | ICD-10-CM | POA: Diagnosis not present

## 2019-04-13 DIAGNOSIS — Z741 Need for assistance with personal care: Secondary | ICD-10-CM | POA: Diagnosis not present

## 2019-04-16 DIAGNOSIS — Z741 Need for assistance with personal care: Secondary | ICD-10-CM | POA: Diagnosis not present

## 2019-04-16 DIAGNOSIS — I6930 Unspecified sequelae of cerebral infarction: Secondary | ICD-10-CM | POA: Diagnosis not present

## 2019-04-17 DIAGNOSIS — I6930 Unspecified sequelae of cerebral infarction: Secondary | ICD-10-CM | POA: Diagnosis not present

## 2019-04-17 DIAGNOSIS — Z741 Need for assistance with personal care: Secondary | ICD-10-CM | POA: Diagnosis not present

## 2019-04-18 DIAGNOSIS — I6930 Unspecified sequelae of cerebral infarction: Secondary | ICD-10-CM | POA: Diagnosis not present

## 2019-04-18 DIAGNOSIS — Z741 Need for assistance with personal care: Secondary | ICD-10-CM | POA: Diagnosis not present

## 2019-04-19 DIAGNOSIS — I6930 Unspecified sequelae of cerebral infarction: Secondary | ICD-10-CM | POA: Diagnosis not present

## 2019-04-19 DIAGNOSIS — Z741 Need for assistance with personal care: Secondary | ICD-10-CM | POA: Diagnosis not present

## 2019-04-20 DIAGNOSIS — F419 Anxiety disorder, unspecified: Secondary | ICD-10-CM | POA: Diagnosis not present

## 2019-04-20 DIAGNOSIS — I6930 Unspecified sequelae of cerebral infarction: Secondary | ICD-10-CM | POA: Diagnosis not present

## 2019-04-20 DIAGNOSIS — Z741 Need for assistance with personal care: Secondary | ICD-10-CM | POA: Diagnosis not present

## 2019-04-20 DIAGNOSIS — F330 Major depressive disorder, recurrent, mild: Secondary | ICD-10-CM | POA: Diagnosis not present

## 2019-04-23 DIAGNOSIS — Z741 Need for assistance with personal care: Secondary | ICD-10-CM | POA: Diagnosis not present

## 2019-04-23 DIAGNOSIS — I6930 Unspecified sequelae of cerebral infarction: Secondary | ICD-10-CM | POA: Diagnosis not present

## 2019-04-24 DIAGNOSIS — Z23 Encounter for immunization: Secondary | ICD-10-CM | POA: Diagnosis not present

## 2019-04-24 DIAGNOSIS — Z741 Need for assistance with personal care: Secondary | ICD-10-CM | POA: Diagnosis not present

## 2019-04-24 DIAGNOSIS — I6930 Unspecified sequelae of cerebral infarction: Secondary | ICD-10-CM | POA: Diagnosis not present

## 2019-04-25 DIAGNOSIS — Z741 Need for assistance with personal care: Secondary | ICD-10-CM | POA: Diagnosis not present

## 2019-04-25 DIAGNOSIS — I6930 Unspecified sequelae of cerebral infarction: Secondary | ICD-10-CM | POA: Diagnosis not present

## 2019-04-26 DIAGNOSIS — F419 Anxiety disorder, unspecified: Secondary | ICD-10-CM | POA: Diagnosis not present

## 2019-04-26 DIAGNOSIS — Z741 Need for assistance with personal care: Secondary | ICD-10-CM | POA: Diagnosis not present

## 2019-04-26 DIAGNOSIS — F330 Major depressive disorder, recurrent, mild: Secondary | ICD-10-CM | POA: Diagnosis not present

## 2019-04-26 DIAGNOSIS — I6930 Unspecified sequelae of cerebral infarction: Secondary | ICD-10-CM | POA: Diagnosis not present

## 2019-04-27 DIAGNOSIS — I6930 Unspecified sequelae of cerebral infarction: Secondary | ICD-10-CM | POA: Diagnosis not present

## 2019-04-27 DIAGNOSIS — Z741 Need for assistance with personal care: Secondary | ICD-10-CM | POA: Diagnosis not present

## 2019-04-30 DIAGNOSIS — I6930 Unspecified sequelae of cerebral infarction: Secondary | ICD-10-CM | POA: Diagnosis not present

## 2019-04-30 DIAGNOSIS — Z741 Need for assistance with personal care: Secondary | ICD-10-CM | POA: Diagnosis not present

## 2019-05-01 ENCOUNTER — Ambulatory Visit (INDEPENDENT_AMBULATORY_CARE_PROVIDER_SITE_OTHER): Payer: Medicare Other | Admitting: *Deleted

## 2019-05-01 DIAGNOSIS — I6330 Cerebral infarction due to thrombosis of unspecified cerebral artery: Secondary | ICD-10-CM | POA: Diagnosis not present

## 2019-05-01 DIAGNOSIS — I6930 Unspecified sequelae of cerebral infarction: Secondary | ICD-10-CM | POA: Diagnosis not present

## 2019-05-01 DIAGNOSIS — Z741 Need for assistance with personal care: Secondary | ICD-10-CM | POA: Diagnosis not present

## 2019-05-01 LAB — CUP PACEART REMOTE DEVICE CHECK
Date Time Interrogation Session: 20210214232539
Implantable Pulse Generator Implant Date: 20190430

## 2019-05-02 DIAGNOSIS — Z741 Need for assistance with personal care: Secondary | ICD-10-CM | POA: Diagnosis not present

## 2019-05-02 DIAGNOSIS — G8191 Hemiplegia, unspecified affecting right dominant side: Secondary | ICD-10-CM | POA: Diagnosis not present

## 2019-05-02 DIAGNOSIS — I10 Essential (primary) hypertension: Secondary | ICD-10-CM | POA: Diagnosis not present

## 2019-05-02 DIAGNOSIS — I6930 Unspecified sequelae of cerebral infarction: Secondary | ICD-10-CM | POA: Diagnosis not present

## 2019-05-02 DIAGNOSIS — E1165 Type 2 diabetes mellitus with hyperglycemia: Secondary | ICD-10-CM | POA: Diagnosis not present

## 2019-05-02 DIAGNOSIS — G4700 Insomnia, unspecified: Secondary | ICD-10-CM | POA: Diagnosis not present

## 2019-05-02 DIAGNOSIS — I82409 Acute embolism and thrombosis of unspecified deep veins of unspecified lower extremity: Secondary | ICD-10-CM | POA: Diagnosis not present

## 2019-05-02 DIAGNOSIS — D649 Anemia, unspecified: Secondary | ICD-10-CM | POA: Diagnosis not present

## 2019-05-02 NOTE — Progress Notes (Signed)
ILR Remote 

## 2019-05-03 DIAGNOSIS — I6930 Unspecified sequelae of cerebral infarction: Secondary | ICD-10-CM | POA: Diagnosis not present

## 2019-05-03 DIAGNOSIS — Z741 Need for assistance with personal care: Secondary | ICD-10-CM | POA: Diagnosis not present

## 2019-05-04 DIAGNOSIS — Z741 Need for assistance with personal care: Secondary | ICD-10-CM | POA: Diagnosis not present

## 2019-05-04 DIAGNOSIS — I6930 Unspecified sequelae of cerebral infarction: Secondary | ICD-10-CM | POA: Diagnosis not present

## 2019-05-05 DIAGNOSIS — F419 Anxiety disorder, unspecified: Secondary | ICD-10-CM | POA: Diagnosis not present

## 2019-05-05 DIAGNOSIS — F330 Major depressive disorder, recurrent, mild: Secondary | ICD-10-CM | POA: Diagnosis not present

## 2019-05-07 DIAGNOSIS — Z741 Need for assistance with personal care: Secondary | ICD-10-CM | POA: Diagnosis not present

## 2019-05-07 DIAGNOSIS — I6930 Unspecified sequelae of cerebral infarction: Secondary | ICD-10-CM | POA: Diagnosis not present

## 2019-05-08 DIAGNOSIS — I6930 Unspecified sequelae of cerebral infarction: Secondary | ICD-10-CM | POA: Diagnosis not present

## 2019-05-08 DIAGNOSIS — Z741 Need for assistance with personal care: Secondary | ICD-10-CM | POA: Diagnosis not present

## 2019-05-09 DIAGNOSIS — Z741 Need for assistance with personal care: Secondary | ICD-10-CM | POA: Diagnosis not present

## 2019-05-09 DIAGNOSIS — I6930 Unspecified sequelae of cerebral infarction: Secondary | ICD-10-CM | POA: Diagnosis not present

## 2019-05-09 DIAGNOSIS — R2681 Unsteadiness on feet: Secondary | ICD-10-CM | POA: Diagnosis not present

## 2019-05-09 DIAGNOSIS — D649 Anemia, unspecified: Secondary | ICD-10-CM | POA: Diagnosis not present

## 2019-05-09 DIAGNOSIS — E1165 Type 2 diabetes mellitus with hyperglycemia: Secondary | ICD-10-CM | POA: Diagnosis not present

## 2019-05-09 DIAGNOSIS — I10 Essential (primary) hypertension: Secondary | ICD-10-CM | POA: Diagnosis not present

## 2019-05-10 DIAGNOSIS — I6930 Unspecified sequelae of cerebral infarction: Secondary | ICD-10-CM | POA: Diagnosis not present

## 2019-05-10 DIAGNOSIS — A539 Syphilis, unspecified: Secondary | ICD-10-CM | POA: Diagnosis not present

## 2019-05-10 DIAGNOSIS — Z741 Need for assistance with personal care: Secondary | ICD-10-CM | POA: Diagnosis not present

## 2019-05-11 DIAGNOSIS — I6930 Unspecified sequelae of cerebral infarction: Secondary | ICD-10-CM | POA: Diagnosis not present

## 2019-05-11 DIAGNOSIS — Z741 Need for assistance with personal care: Secondary | ICD-10-CM | POA: Diagnosis not present

## 2019-05-12 DIAGNOSIS — L089 Local infection of the skin and subcutaneous tissue, unspecified: Secondary | ICD-10-CM | POA: Diagnosis not present

## 2019-05-12 DIAGNOSIS — R296 Repeated falls: Secondary | ICD-10-CM | POA: Diagnosis not present

## 2019-05-14 DIAGNOSIS — Z741 Need for assistance with personal care: Secondary | ICD-10-CM | POA: Diagnosis not present

## 2019-05-14 DIAGNOSIS — I6930 Unspecified sequelae of cerebral infarction: Secondary | ICD-10-CM | POA: Diagnosis not present

## 2019-05-15 DIAGNOSIS — I6930 Unspecified sequelae of cerebral infarction: Secondary | ICD-10-CM | POA: Diagnosis not present

## 2019-05-15 DIAGNOSIS — F330 Major depressive disorder, recurrent, mild: Secondary | ICD-10-CM | POA: Diagnosis not present

## 2019-05-15 DIAGNOSIS — G4700 Insomnia, unspecified: Secondary | ICD-10-CM | POA: Diagnosis not present

## 2019-05-15 DIAGNOSIS — Z741 Need for assistance with personal care: Secondary | ICD-10-CM | POA: Diagnosis not present

## 2019-05-15 DIAGNOSIS — F419 Anxiety disorder, unspecified: Secondary | ICD-10-CM | POA: Diagnosis not present

## 2019-05-16 DIAGNOSIS — Z741 Need for assistance with personal care: Secondary | ICD-10-CM | POA: Diagnosis not present

## 2019-05-16 DIAGNOSIS — I6930 Unspecified sequelae of cerebral infarction: Secondary | ICD-10-CM | POA: Diagnosis not present

## 2019-05-17 DIAGNOSIS — I6930 Unspecified sequelae of cerebral infarction: Secondary | ICD-10-CM | POA: Diagnosis not present

## 2019-05-17 DIAGNOSIS — Z741 Need for assistance with personal care: Secondary | ICD-10-CM | POA: Diagnosis not present

## 2019-05-18 DIAGNOSIS — F330 Major depressive disorder, recurrent, mild: Secondary | ICD-10-CM | POA: Diagnosis not present

## 2019-05-18 DIAGNOSIS — F419 Anxiety disorder, unspecified: Secondary | ICD-10-CM | POA: Diagnosis not present

## 2019-05-21 DIAGNOSIS — I639 Cerebral infarction, unspecified: Secondary | ICD-10-CM | POA: Diagnosis not present

## 2019-05-21 DIAGNOSIS — I6930 Unspecified sequelae of cerebral infarction: Secondary | ICD-10-CM | POA: Diagnosis not present

## 2019-05-21 DIAGNOSIS — I82409 Acute embolism and thrombosis of unspecified deep veins of unspecified lower extremity: Secondary | ICD-10-CM | POA: Diagnosis not present

## 2019-05-21 DIAGNOSIS — E1165 Type 2 diabetes mellitus with hyperglycemia: Secondary | ICD-10-CM | POA: Diagnosis not present

## 2019-05-21 DIAGNOSIS — Z741 Need for assistance with personal care: Secondary | ICD-10-CM | POA: Diagnosis not present

## 2019-05-21 DIAGNOSIS — I10 Essential (primary) hypertension: Secondary | ICD-10-CM | POA: Diagnosis not present

## 2019-05-22 DIAGNOSIS — Z741 Need for assistance with personal care: Secondary | ICD-10-CM | POA: Diagnosis not present

## 2019-05-22 DIAGNOSIS — I6930 Unspecified sequelae of cerebral infarction: Secondary | ICD-10-CM | POA: Diagnosis not present

## 2019-05-23 DIAGNOSIS — Z741 Need for assistance with personal care: Secondary | ICD-10-CM | POA: Diagnosis not present

## 2019-05-23 DIAGNOSIS — I6930 Unspecified sequelae of cerebral infarction: Secondary | ICD-10-CM | POA: Diagnosis not present

## 2019-05-24 DIAGNOSIS — Z741 Need for assistance with personal care: Secondary | ICD-10-CM | POA: Diagnosis not present

## 2019-05-24 DIAGNOSIS — I6930 Unspecified sequelae of cerebral infarction: Secondary | ICD-10-CM | POA: Diagnosis not present

## 2019-05-25 DIAGNOSIS — Z741 Need for assistance with personal care: Secondary | ICD-10-CM | POA: Diagnosis not present

## 2019-05-25 DIAGNOSIS — I6930 Unspecified sequelae of cerebral infarction: Secondary | ICD-10-CM | POA: Diagnosis not present

## 2019-05-25 DIAGNOSIS — F330 Major depressive disorder, recurrent, mild: Secondary | ICD-10-CM | POA: Diagnosis not present

## 2019-05-25 DIAGNOSIS — F419 Anxiety disorder, unspecified: Secondary | ICD-10-CM | POA: Diagnosis not present

## 2019-05-25 DIAGNOSIS — G4700 Insomnia, unspecified: Secondary | ICD-10-CM | POA: Diagnosis not present

## 2019-05-28 DIAGNOSIS — I6930 Unspecified sequelae of cerebral infarction: Secondary | ICD-10-CM | POA: Diagnosis not present

## 2019-05-28 DIAGNOSIS — Z741 Need for assistance with personal care: Secondary | ICD-10-CM | POA: Diagnosis not present

## 2019-05-29 DIAGNOSIS — I6930 Unspecified sequelae of cerebral infarction: Secondary | ICD-10-CM | POA: Diagnosis not present

## 2019-05-29 DIAGNOSIS — Z741 Need for assistance with personal care: Secondary | ICD-10-CM | POA: Diagnosis not present

## 2019-05-30 DIAGNOSIS — Z794 Long term (current) use of insulin: Secondary | ICD-10-CM | POA: Diagnosis not present

## 2019-05-30 DIAGNOSIS — Z741 Need for assistance with personal care: Secondary | ICD-10-CM | POA: Diagnosis not present

## 2019-05-30 DIAGNOSIS — E113293 Type 2 diabetes mellitus with mild nonproliferative diabetic retinopathy without macular edema, bilateral: Secondary | ICD-10-CM | POA: Diagnosis not present

## 2019-05-30 DIAGNOSIS — E1165 Type 2 diabetes mellitus with hyperglycemia: Secondary | ICD-10-CM | POA: Diagnosis not present

## 2019-05-30 DIAGNOSIS — E1140 Type 2 diabetes mellitus with diabetic neuropathy, unspecified: Secondary | ICD-10-CM | POA: Diagnosis not present

## 2019-05-30 DIAGNOSIS — I10 Essential (primary) hypertension: Secondary | ICD-10-CM | POA: Diagnosis not present

## 2019-05-30 DIAGNOSIS — H524 Presbyopia: Secondary | ICD-10-CM | POA: Diagnosis not present

## 2019-05-30 DIAGNOSIS — I6930 Unspecified sequelae of cerebral infarction: Secondary | ICD-10-CM | POA: Diagnosis not present

## 2019-05-30 DIAGNOSIS — E785 Hyperlipidemia, unspecified: Secondary | ICD-10-CM | POA: Diagnosis not present

## 2019-05-30 DIAGNOSIS — Z961 Presence of intraocular lens: Secondary | ICD-10-CM | POA: Diagnosis not present

## 2019-05-31 DIAGNOSIS — I6930 Unspecified sequelae of cerebral infarction: Secondary | ICD-10-CM | POA: Diagnosis not present

## 2019-05-31 DIAGNOSIS — Z741 Need for assistance with personal care: Secondary | ICD-10-CM | POA: Diagnosis not present

## 2019-06-01 ENCOUNTER — Ambulatory Visit (INDEPENDENT_AMBULATORY_CARE_PROVIDER_SITE_OTHER): Payer: Medicare Other | Admitting: *Deleted

## 2019-06-01 DIAGNOSIS — I6330 Cerebral infarction due to thrombosis of unspecified cerebral artery: Secondary | ICD-10-CM | POA: Diagnosis not present

## 2019-06-01 DIAGNOSIS — I6930 Unspecified sequelae of cerebral infarction: Secondary | ICD-10-CM | POA: Diagnosis not present

## 2019-06-01 DIAGNOSIS — Z741 Need for assistance with personal care: Secondary | ICD-10-CM | POA: Diagnosis not present

## 2019-06-01 LAB — CUP PACEART REMOTE DEVICE CHECK
Date Time Interrogation Session: 20210318012221
Implantable Pulse Generator Implant Date: 20190430

## 2019-06-02 DIAGNOSIS — F330 Major depressive disorder, recurrent, mild: Secondary | ICD-10-CM | POA: Diagnosis not present

## 2019-06-02 NOTE — Progress Notes (Signed)
ILR Remote 

## 2019-06-04 DIAGNOSIS — Z741 Need for assistance with personal care: Secondary | ICD-10-CM | POA: Diagnosis not present

## 2019-06-04 DIAGNOSIS — I6930 Unspecified sequelae of cerebral infarction: Secondary | ICD-10-CM | POA: Diagnosis not present

## 2019-06-05 DIAGNOSIS — Z741 Need for assistance with personal care: Secondary | ICD-10-CM | POA: Diagnosis not present

## 2019-06-05 DIAGNOSIS — I6930 Unspecified sequelae of cerebral infarction: Secondary | ICD-10-CM | POA: Diagnosis not present

## 2019-06-06 DIAGNOSIS — Z741 Need for assistance with personal care: Secondary | ICD-10-CM | POA: Diagnosis not present

## 2019-06-06 DIAGNOSIS — I6930 Unspecified sequelae of cerebral infarction: Secondary | ICD-10-CM | POA: Diagnosis not present

## 2019-06-07 DIAGNOSIS — Z741 Need for assistance with personal care: Secondary | ICD-10-CM | POA: Diagnosis not present

## 2019-06-07 DIAGNOSIS — R5381 Other malaise: Secondary | ICD-10-CM | POA: Diagnosis not present

## 2019-06-07 DIAGNOSIS — F319 Bipolar disorder, unspecified: Secondary | ICD-10-CM | POA: Diagnosis not present

## 2019-06-07 DIAGNOSIS — I6930 Unspecified sequelae of cerebral infarction: Secondary | ICD-10-CM | POA: Diagnosis not present

## 2019-06-07 DIAGNOSIS — I10 Essential (primary) hypertension: Secondary | ICD-10-CM | POA: Diagnosis not present

## 2019-06-07 DIAGNOSIS — E1140 Type 2 diabetes mellitus with diabetic neuropathy, unspecified: Secondary | ICD-10-CM | POA: Diagnosis not present

## 2019-06-08 DIAGNOSIS — Z741 Need for assistance with personal care: Secondary | ICD-10-CM | POA: Diagnosis not present

## 2019-06-08 DIAGNOSIS — I6930 Unspecified sequelae of cerebral infarction: Secondary | ICD-10-CM | POA: Diagnosis not present

## 2019-06-09 DIAGNOSIS — F329 Major depressive disorder, single episode, unspecified: Secondary | ICD-10-CM | POA: Diagnosis not present

## 2019-06-09 DIAGNOSIS — F319 Bipolar disorder, unspecified: Secondary | ICD-10-CM | POA: Diagnosis not present

## 2019-06-12 DIAGNOSIS — Z741 Need for assistance with personal care: Secondary | ICD-10-CM | POA: Diagnosis not present

## 2019-06-12 DIAGNOSIS — I6930 Unspecified sequelae of cerebral infarction: Secondary | ICD-10-CM | POA: Diagnosis not present

## 2019-06-13 DIAGNOSIS — Z741 Need for assistance with personal care: Secondary | ICD-10-CM | POA: Diagnosis not present

## 2019-06-13 DIAGNOSIS — R220 Localized swelling, mass and lump, head: Secondary | ICD-10-CM | POA: Diagnosis not present

## 2019-06-13 DIAGNOSIS — I6930 Unspecified sequelae of cerebral infarction: Secondary | ICD-10-CM | POA: Diagnosis not present

## 2019-06-14 DIAGNOSIS — I6930 Unspecified sequelae of cerebral infarction: Secondary | ICD-10-CM | POA: Diagnosis not present

## 2019-06-14 DIAGNOSIS — Z741 Need for assistance with personal care: Secondary | ICD-10-CM | POA: Diagnosis not present

## 2019-06-15 DIAGNOSIS — Z741 Need for assistance with personal care: Secondary | ICD-10-CM | POA: Diagnosis not present

## 2019-06-15 DIAGNOSIS — F330 Major depressive disorder, recurrent, mild: Secondary | ICD-10-CM | POA: Diagnosis not present

## 2019-06-15 DIAGNOSIS — I6930 Unspecified sequelae of cerebral infarction: Secondary | ICD-10-CM | POA: Diagnosis not present

## 2019-06-16 DIAGNOSIS — I6930 Unspecified sequelae of cerebral infarction: Secondary | ICD-10-CM | POA: Diagnosis not present

## 2019-06-16 DIAGNOSIS — Z741 Need for assistance with personal care: Secondary | ICD-10-CM | POA: Diagnosis not present

## 2019-06-19 DIAGNOSIS — Z741 Need for assistance with personal care: Secondary | ICD-10-CM | POA: Diagnosis not present

## 2019-06-19 DIAGNOSIS — I10 Essential (primary) hypertension: Secondary | ICD-10-CM | POA: Diagnosis not present

## 2019-06-19 DIAGNOSIS — I6930 Unspecified sequelae of cerebral infarction: Secondary | ICD-10-CM | POA: Diagnosis not present

## 2019-06-20 DIAGNOSIS — I6930 Unspecified sequelae of cerebral infarction: Secondary | ICD-10-CM | POA: Diagnosis not present

## 2019-06-20 DIAGNOSIS — Z741 Need for assistance with personal care: Secondary | ICD-10-CM | POA: Diagnosis not present

## 2019-06-21 DIAGNOSIS — Z741 Need for assistance with personal care: Secondary | ICD-10-CM | POA: Diagnosis not present

## 2019-06-21 DIAGNOSIS — I6930 Unspecified sequelae of cerebral infarction: Secondary | ICD-10-CM | POA: Diagnosis not present

## 2019-06-22 DIAGNOSIS — Z741 Need for assistance with personal care: Secondary | ICD-10-CM | POA: Diagnosis not present

## 2019-06-22 DIAGNOSIS — I6930 Unspecified sequelae of cerebral infarction: Secondary | ICD-10-CM | POA: Diagnosis not present

## 2019-06-23 DIAGNOSIS — Z741 Need for assistance with personal care: Secondary | ICD-10-CM | POA: Diagnosis not present

## 2019-06-23 DIAGNOSIS — I6930 Unspecified sequelae of cerebral infarction: Secondary | ICD-10-CM | POA: Diagnosis not present

## 2019-06-25 DIAGNOSIS — G9341 Metabolic encephalopathy: Secondary | ICD-10-CM | POA: Diagnosis not present

## 2019-06-25 DIAGNOSIS — I6930 Unspecified sequelae of cerebral infarction: Secondary | ICD-10-CM | POA: Diagnosis not present

## 2019-06-25 DIAGNOSIS — I82409 Acute embolism and thrombosis of unspecified deep veins of unspecified lower extremity: Secondary | ICD-10-CM | POA: Diagnosis not present

## 2019-06-25 DIAGNOSIS — I10 Essential (primary) hypertension: Secondary | ICD-10-CM | POA: Diagnosis not present

## 2019-06-25 DIAGNOSIS — Z741 Need for assistance with personal care: Secondary | ICD-10-CM | POA: Diagnosis not present

## 2019-06-25 DIAGNOSIS — E1165 Type 2 diabetes mellitus with hyperglycemia: Secondary | ICD-10-CM | POA: Diagnosis not present

## 2019-06-26 DIAGNOSIS — I6930 Unspecified sequelae of cerebral infarction: Secondary | ICD-10-CM | POA: Diagnosis not present

## 2019-06-26 DIAGNOSIS — Z741 Need for assistance with personal care: Secondary | ICD-10-CM | POA: Diagnosis not present

## 2019-06-27 DIAGNOSIS — E1140 Type 2 diabetes mellitus with diabetic neuropathy, unspecified: Secondary | ICD-10-CM | POA: Diagnosis not present

## 2019-06-27 DIAGNOSIS — K5900 Constipation, unspecified: Secondary | ICD-10-CM | POA: Diagnosis not present

## 2019-06-27 DIAGNOSIS — I10 Essential (primary) hypertension: Secondary | ICD-10-CM | POA: Diagnosis not present

## 2019-06-27 DIAGNOSIS — Z741 Need for assistance with personal care: Secondary | ICD-10-CM | POA: Diagnosis not present

## 2019-06-27 DIAGNOSIS — E785 Hyperlipidemia, unspecified: Secondary | ICD-10-CM | POA: Diagnosis not present

## 2019-06-27 DIAGNOSIS — I6930 Unspecified sequelae of cerebral infarction: Secondary | ICD-10-CM | POA: Diagnosis not present

## 2019-06-28 DIAGNOSIS — Z741 Need for assistance with personal care: Secondary | ICD-10-CM | POA: Diagnosis not present

## 2019-06-28 DIAGNOSIS — I6930 Unspecified sequelae of cerebral infarction: Secondary | ICD-10-CM | POA: Diagnosis not present

## 2019-06-29 DIAGNOSIS — E1165 Type 2 diabetes mellitus with hyperglycemia: Secondary | ICD-10-CM | POA: Diagnosis not present

## 2019-06-29 DIAGNOSIS — I10 Essential (primary) hypertension: Secondary | ICD-10-CM | POA: Diagnosis not present

## 2019-06-29 DIAGNOSIS — E1140 Type 2 diabetes mellitus with diabetic neuropathy, unspecified: Secondary | ICD-10-CM | POA: Diagnosis not present

## 2019-06-29 DIAGNOSIS — Z741 Need for assistance with personal care: Secondary | ICD-10-CM | POA: Diagnosis not present

## 2019-06-29 DIAGNOSIS — E785 Hyperlipidemia, unspecified: Secondary | ICD-10-CM | POA: Diagnosis not present

## 2019-06-29 DIAGNOSIS — I6930 Unspecified sequelae of cerebral infarction: Secondary | ICD-10-CM | POA: Diagnosis not present

## 2019-06-30 DIAGNOSIS — F419 Anxiety disorder, unspecified: Secondary | ICD-10-CM | POA: Diagnosis not present

## 2019-06-30 DIAGNOSIS — F330 Major depressive disorder, recurrent, mild: Secondary | ICD-10-CM | POA: Diagnosis not present

## 2019-07-02 DIAGNOSIS — I6930 Unspecified sequelae of cerebral infarction: Secondary | ICD-10-CM | POA: Diagnosis not present

## 2019-07-02 DIAGNOSIS — Z741 Need for assistance with personal care: Secondary | ICD-10-CM | POA: Diagnosis not present

## 2019-07-02 LAB — CUP PACEART REMOTE DEVICE CHECK
Date Time Interrogation Session: 20210418013212
Implantable Pulse Generator Implant Date: 20190430

## 2019-07-03 ENCOUNTER — Ambulatory Visit (INDEPENDENT_AMBULATORY_CARE_PROVIDER_SITE_OTHER): Payer: Medicare Other | Admitting: *Deleted

## 2019-07-03 DIAGNOSIS — I6330 Cerebral infarction due to thrombosis of unspecified cerebral artery: Secondary | ICD-10-CM

## 2019-07-03 DIAGNOSIS — I6930 Unspecified sequelae of cerebral infarction: Secondary | ICD-10-CM | POA: Diagnosis not present

## 2019-07-03 DIAGNOSIS — Z741 Need for assistance with personal care: Secondary | ICD-10-CM | POA: Diagnosis not present

## 2019-07-04 DIAGNOSIS — Z741 Need for assistance with personal care: Secondary | ICD-10-CM | POA: Diagnosis not present

## 2019-07-04 DIAGNOSIS — I6930 Unspecified sequelae of cerebral infarction: Secondary | ICD-10-CM | POA: Diagnosis not present

## 2019-07-04 NOTE — Progress Notes (Signed)
ILR Remote 

## 2019-07-05 DIAGNOSIS — Z741 Need for assistance with personal care: Secondary | ICD-10-CM | POA: Diagnosis not present

## 2019-07-05 DIAGNOSIS — I6930 Unspecified sequelae of cerebral infarction: Secondary | ICD-10-CM | POA: Diagnosis not present

## 2019-07-06 DIAGNOSIS — I6930 Unspecified sequelae of cerebral infarction: Secondary | ICD-10-CM | POA: Diagnosis not present

## 2019-07-06 DIAGNOSIS — Z741 Need for assistance with personal care: Secondary | ICD-10-CM | POA: Diagnosis not present

## 2019-07-18 DIAGNOSIS — E1165 Type 2 diabetes mellitus with hyperglycemia: Secondary | ICD-10-CM | POA: Diagnosis not present

## 2019-07-18 DIAGNOSIS — I639 Cerebral infarction, unspecified: Secondary | ICD-10-CM | POA: Diagnosis not present

## 2019-07-18 DIAGNOSIS — I10 Essential (primary) hypertension: Secondary | ICD-10-CM | POA: Diagnosis not present

## 2019-07-18 DIAGNOSIS — F0150 Vascular dementia without behavioral disturbance: Secondary | ICD-10-CM | POA: Diagnosis not present

## 2019-07-19 DIAGNOSIS — R4182 Altered mental status, unspecified: Secondary | ICD-10-CM | POA: Diagnosis not present

## 2019-07-19 DIAGNOSIS — R29810 Facial weakness: Secondary | ICD-10-CM | POA: Diagnosis not present

## 2019-07-19 DIAGNOSIS — I10 Essential (primary) hypertension: Secondary | ICD-10-CM | POA: Diagnosis not present

## 2019-07-19 DIAGNOSIS — M6281 Muscle weakness (generalized): Secondary | ICD-10-CM | POA: Diagnosis not present

## 2019-07-19 DIAGNOSIS — R1310 Dysphagia, unspecified: Secondary | ICD-10-CM | POA: Diagnosis not present

## 2019-07-19 DIAGNOSIS — I69320 Aphasia following cerebral infarction: Secondary | ICD-10-CM | POA: Diagnosis not present

## 2019-07-19 DIAGNOSIS — E669 Obesity, unspecified: Secondary | ICD-10-CM | POA: Diagnosis present

## 2019-07-19 DIAGNOSIS — Z87891 Personal history of nicotine dependence: Secondary | ICD-10-CM | POA: Diagnosis not present

## 2019-07-19 DIAGNOSIS — E46 Unspecified protein-calorie malnutrition: Secondary | ICD-10-CM | POA: Diagnosis not present

## 2019-07-19 DIAGNOSIS — R2681 Unsteadiness on feet: Secondary | ICD-10-CM | POA: Diagnosis not present

## 2019-07-19 DIAGNOSIS — I6930 Unspecified sequelae of cerebral infarction: Secondary | ICD-10-CM | POA: Diagnosis not present

## 2019-07-19 DIAGNOSIS — Z881 Allergy status to other antibiotic agents status: Secondary | ICD-10-CM | POA: Diagnosis not present

## 2019-07-19 DIAGNOSIS — D649 Anemia, unspecified: Secondary | ICD-10-CM | POA: Diagnosis not present

## 2019-07-19 DIAGNOSIS — Z9049 Acquired absence of other specified parts of digestive tract: Secondary | ICD-10-CM | POA: Diagnosis not present

## 2019-07-19 DIAGNOSIS — R404 Transient alteration of awareness: Secondary | ICD-10-CM | POA: Diagnosis not present

## 2019-07-19 DIAGNOSIS — I69390 Apraxia following cerebral infarction: Secondary | ICD-10-CM | POA: Diagnosis not present

## 2019-07-19 DIAGNOSIS — R279 Unspecified lack of coordination: Secondary | ICD-10-CM | POA: Diagnosis not present

## 2019-07-19 DIAGNOSIS — R29898 Other symptoms and signs involving the musculoskeletal system: Secondary | ICD-10-CM | POA: Diagnosis not present

## 2019-07-19 DIAGNOSIS — R531 Weakness: Secondary | ICD-10-CM | POA: Diagnosis not present

## 2019-07-19 DIAGNOSIS — F419 Anxiety disorder, unspecified: Secondary | ICD-10-CM | POA: Diagnosis not present

## 2019-07-19 DIAGNOSIS — I69991 Dysphagia following unspecified cerebrovascular disease: Secondary | ICD-10-CM | POA: Diagnosis not present

## 2019-07-19 DIAGNOSIS — Z6832 Body mass index (BMI) 32.0-32.9, adult: Secondary | ICD-10-CM | POA: Diagnosis not present

## 2019-07-19 DIAGNOSIS — R4781 Slurred speech: Secondary | ICD-10-CM | POA: Diagnosis not present

## 2019-07-19 DIAGNOSIS — G459 Transient cerebral ischemic attack, unspecified: Secondary | ICD-10-CM | POA: Diagnosis not present

## 2019-07-19 DIAGNOSIS — I69391 Dysphagia following cerebral infarction: Secondary | ICD-10-CM | POA: Diagnosis not present

## 2019-07-19 DIAGNOSIS — R262 Difficulty in walking, not elsewhere classified: Secondary | ICD-10-CM | POA: Diagnosis not present

## 2019-07-19 DIAGNOSIS — E8809 Other disorders of plasma-protein metabolism, not elsewhere classified: Secondary | ICD-10-CM | POA: Diagnosis not present

## 2019-07-19 DIAGNOSIS — I69951 Hemiplegia and hemiparesis following unspecified cerebrovascular disease affecting right dominant side: Secondary | ICD-10-CM | POA: Diagnosis not present

## 2019-07-19 DIAGNOSIS — E1165 Type 2 diabetes mellitus with hyperglycemia: Secondary | ICD-10-CM | POA: Diagnosis not present

## 2019-07-19 DIAGNOSIS — R519 Headache, unspecified: Secondary | ICD-10-CM | POA: Diagnosis not present

## 2019-07-19 DIAGNOSIS — Z7401 Bed confinement status: Secondary | ICD-10-CM | POA: Diagnosis not present

## 2019-07-19 DIAGNOSIS — N390 Urinary tract infection, site not specified: Secondary | ICD-10-CM | POA: Diagnosis not present

## 2019-07-19 DIAGNOSIS — E785 Hyperlipidemia, unspecified: Secondary | ICD-10-CM | POA: Diagnosis not present

## 2019-07-19 DIAGNOSIS — R1312 Dysphagia, oropharyngeal phase: Secondary | ICD-10-CM | POA: Diagnosis not present

## 2019-07-19 DIAGNOSIS — I674 Hypertensive encephalopathy: Secondary | ICD-10-CM | POA: Diagnosis not present

## 2019-07-19 DIAGNOSIS — I69851 Hemiplegia and hemiparesis following other cerebrovascular disease affecting right dominant side: Secondary | ICD-10-CM | POA: Diagnosis not present

## 2019-07-19 DIAGNOSIS — D500 Iron deficiency anemia secondary to blood loss (chronic): Secondary | ICD-10-CM | POA: Diagnosis not present

## 2019-07-19 DIAGNOSIS — F0150 Vascular dementia without behavioral disturbance: Secondary | ICD-10-CM | POA: Diagnosis not present

## 2019-07-19 DIAGNOSIS — Z794 Long term (current) use of insulin: Secondary | ICD-10-CM | POA: Diagnosis not present

## 2019-07-19 DIAGNOSIS — R471 Dysarthria and anarthria: Secondary | ICD-10-CM | POA: Diagnosis not present

## 2019-07-19 DIAGNOSIS — R2689 Other abnormalities of gait and mobility: Secondary | ICD-10-CM | POA: Diagnosis present

## 2019-07-19 DIAGNOSIS — Z741 Need for assistance with personal care: Secondary | ICD-10-CM | POA: Diagnosis not present

## 2019-07-19 DIAGNOSIS — F319 Bipolar disorder, unspecified: Secondary | ICD-10-CM | POA: Diagnosis not present

## 2019-07-19 DIAGNOSIS — I639 Cerebral infarction, unspecified: Secondary | ICD-10-CM | POA: Diagnosis not present

## 2019-07-19 DIAGNOSIS — F0390 Unspecified dementia without behavioral disturbance: Secondary | ICD-10-CM | POA: Diagnosis not present

## 2019-07-19 DIAGNOSIS — F339 Major depressive disorder, recurrent, unspecified: Secondary | ICD-10-CM | POA: Diagnosis not present

## 2019-07-19 DIAGNOSIS — R482 Apraxia: Secondary | ICD-10-CM | POA: Diagnosis not present

## 2019-07-19 DIAGNOSIS — Z91040 Latex allergy status: Secondary | ICD-10-CM | POA: Diagnosis not present

## 2019-07-19 DIAGNOSIS — E1140 Type 2 diabetes mellitus with diabetic neuropathy, unspecified: Secondary | ICD-10-CM | POA: Diagnosis not present

## 2019-07-22 DIAGNOSIS — R41841 Cognitive communication deficit: Secondary | ICD-10-CM | POA: Diagnosis not present

## 2019-07-22 DIAGNOSIS — I6930 Unspecified sequelae of cerebral infarction: Secondary | ICD-10-CM | POA: Diagnosis not present

## 2019-07-22 DIAGNOSIS — Z741 Need for assistance with personal care: Secondary | ICD-10-CM | POA: Diagnosis not present

## 2019-07-24 DIAGNOSIS — I6930 Unspecified sequelae of cerebral infarction: Secondary | ICD-10-CM | POA: Diagnosis not present

## 2019-07-24 DIAGNOSIS — Z741 Need for assistance with personal care: Secondary | ICD-10-CM | POA: Diagnosis not present

## 2019-07-24 DIAGNOSIS — R41841 Cognitive communication deficit: Secondary | ICD-10-CM | POA: Diagnosis not present

## 2019-07-25 DIAGNOSIS — R41841 Cognitive communication deficit: Secondary | ICD-10-CM | POA: Diagnosis not present

## 2019-07-25 DIAGNOSIS — Z741 Need for assistance with personal care: Secondary | ICD-10-CM | POA: Diagnosis not present

## 2019-07-25 DIAGNOSIS — I639 Cerebral infarction, unspecified: Secondary | ICD-10-CM | POA: Diagnosis not present

## 2019-07-25 DIAGNOSIS — R1310 Dysphagia, unspecified: Secondary | ICD-10-CM | POA: Diagnosis not present

## 2019-07-25 DIAGNOSIS — I6930 Unspecified sequelae of cerebral infarction: Secondary | ICD-10-CM | POA: Diagnosis not present

## 2019-07-25 DIAGNOSIS — F329 Major depressive disorder, single episode, unspecified: Secondary | ICD-10-CM | POA: Diagnosis not present

## 2019-07-25 DIAGNOSIS — I10 Essential (primary) hypertension: Secondary | ICD-10-CM | POA: Diagnosis not present

## 2019-07-26 DIAGNOSIS — I6930 Unspecified sequelae of cerebral infarction: Secondary | ICD-10-CM | POA: Diagnosis not present

## 2019-07-26 DIAGNOSIS — Z741 Need for assistance with personal care: Secondary | ICD-10-CM | POA: Diagnosis not present

## 2019-07-26 DIAGNOSIS — R41841 Cognitive communication deficit: Secondary | ICD-10-CM | POA: Diagnosis not present

## 2019-07-27 DIAGNOSIS — Z741 Need for assistance with personal care: Secondary | ICD-10-CM | POA: Diagnosis not present

## 2019-07-27 DIAGNOSIS — I6930 Unspecified sequelae of cerebral infarction: Secondary | ICD-10-CM | POA: Diagnosis not present

## 2019-07-27 DIAGNOSIS — R41841 Cognitive communication deficit: Secondary | ICD-10-CM | POA: Diagnosis not present

## 2019-07-28 DIAGNOSIS — I6930 Unspecified sequelae of cerebral infarction: Secondary | ICD-10-CM | POA: Diagnosis not present

## 2019-07-28 DIAGNOSIS — Z741 Need for assistance with personal care: Secondary | ICD-10-CM | POA: Diagnosis not present

## 2019-07-28 DIAGNOSIS — R41841 Cognitive communication deficit: Secondary | ICD-10-CM | POA: Diagnosis not present

## 2019-07-30 DIAGNOSIS — I6930 Unspecified sequelae of cerebral infarction: Secondary | ICD-10-CM | POA: Diagnosis not present

## 2019-07-30 DIAGNOSIS — Z741 Need for assistance with personal care: Secondary | ICD-10-CM | POA: Diagnosis not present

## 2019-07-30 DIAGNOSIS — R41841 Cognitive communication deficit: Secondary | ICD-10-CM | POA: Diagnosis not present

## 2019-07-31 DIAGNOSIS — Z741 Need for assistance with personal care: Secondary | ICD-10-CM | POA: Diagnosis not present

## 2019-07-31 DIAGNOSIS — R41841 Cognitive communication deficit: Secondary | ICD-10-CM | POA: Diagnosis not present

## 2019-07-31 DIAGNOSIS — I6930 Unspecified sequelae of cerebral infarction: Secondary | ICD-10-CM | POA: Diagnosis not present

## 2019-08-01 DIAGNOSIS — R41841 Cognitive communication deficit: Secondary | ICD-10-CM | POA: Diagnosis not present

## 2019-08-01 DIAGNOSIS — I6930 Unspecified sequelae of cerebral infarction: Secondary | ICD-10-CM | POA: Diagnosis not present

## 2019-08-01 DIAGNOSIS — Z741 Need for assistance with personal care: Secondary | ICD-10-CM | POA: Diagnosis not present

## 2019-08-02 DIAGNOSIS — R41841 Cognitive communication deficit: Secondary | ICD-10-CM | POA: Diagnosis not present

## 2019-08-02 DIAGNOSIS — Z741 Need for assistance with personal care: Secondary | ICD-10-CM | POA: Diagnosis not present

## 2019-08-02 DIAGNOSIS — I6930 Unspecified sequelae of cerebral infarction: Secondary | ICD-10-CM | POA: Diagnosis not present

## 2019-08-03 DIAGNOSIS — I6930 Unspecified sequelae of cerebral infarction: Secondary | ICD-10-CM | POA: Diagnosis not present

## 2019-08-03 DIAGNOSIS — Z741 Need for assistance with personal care: Secondary | ICD-10-CM | POA: Diagnosis not present

## 2019-08-03 DIAGNOSIS — R41841 Cognitive communication deficit: Secondary | ICD-10-CM | POA: Diagnosis not present

## 2019-08-04 DIAGNOSIS — Z741 Need for assistance with personal care: Secondary | ICD-10-CM | POA: Diagnosis not present

## 2019-08-04 DIAGNOSIS — R41841 Cognitive communication deficit: Secondary | ICD-10-CM | POA: Diagnosis not present

## 2019-08-04 DIAGNOSIS — I6930 Unspecified sequelae of cerebral infarction: Secondary | ICD-10-CM | POA: Diagnosis not present

## 2019-08-06 DIAGNOSIS — Z741 Need for assistance with personal care: Secondary | ICD-10-CM | POA: Diagnosis not present

## 2019-08-06 DIAGNOSIS — I6930 Unspecified sequelae of cerebral infarction: Secondary | ICD-10-CM | POA: Diagnosis not present

## 2019-08-06 DIAGNOSIS — R41841 Cognitive communication deficit: Secondary | ICD-10-CM | POA: Diagnosis not present

## 2019-08-07 ENCOUNTER — Ambulatory Visit (INDEPENDENT_AMBULATORY_CARE_PROVIDER_SITE_OTHER): Payer: Medicare Other | Admitting: *Deleted

## 2019-08-07 DIAGNOSIS — E119 Type 2 diabetes mellitus without complications: Secondary | ICD-10-CM | POA: Diagnosis not present

## 2019-08-07 DIAGNOSIS — R41841 Cognitive communication deficit: Secondary | ICD-10-CM | POA: Diagnosis not present

## 2019-08-07 DIAGNOSIS — Z79899 Other long term (current) drug therapy: Secondary | ICD-10-CM | POA: Diagnosis not present

## 2019-08-07 DIAGNOSIS — Z741 Need for assistance with personal care: Secondary | ICD-10-CM | POA: Diagnosis not present

## 2019-08-07 DIAGNOSIS — E039 Hypothyroidism, unspecified: Secondary | ICD-10-CM | POA: Diagnosis not present

## 2019-08-07 DIAGNOSIS — R319 Hematuria, unspecified: Secondary | ICD-10-CM | POA: Diagnosis not present

## 2019-08-07 DIAGNOSIS — I639 Cerebral infarction, unspecified: Secondary | ICD-10-CM

## 2019-08-07 DIAGNOSIS — I6930 Unspecified sequelae of cerebral infarction: Secondary | ICD-10-CM | POA: Diagnosis not present

## 2019-08-07 DIAGNOSIS — A539 Syphilis, unspecified: Secondary | ICD-10-CM | POA: Diagnosis not present

## 2019-08-07 DIAGNOSIS — N390 Urinary tract infection, site not specified: Secondary | ICD-10-CM | POA: Diagnosis not present

## 2019-08-07 LAB — CUP PACEART REMOTE DEVICE CHECK
Date Time Interrogation Session: 20210519013405
Implantable Pulse Generator Implant Date: 20190430

## 2019-08-08 DIAGNOSIS — Z741 Need for assistance with personal care: Secondary | ICD-10-CM | POA: Diagnosis not present

## 2019-08-08 DIAGNOSIS — R41841 Cognitive communication deficit: Secondary | ICD-10-CM | POA: Diagnosis not present

## 2019-08-08 DIAGNOSIS — R569 Unspecified convulsions: Secondary | ICD-10-CM | POA: Diagnosis not present

## 2019-08-08 DIAGNOSIS — Z5181 Encounter for therapeutic drug level monitoring: Secondary | ICD-10-CM | POA: Diagnosis not present

## 2019-08-08 DIAGNOSIS — I6930 Unspecified sequelae of cerebral infarction: Secondary | ICD-10-CM | POA: Diagnosis not present

## 2019-08-08 NOTE — Progress Notes (Signed)
Carelink Summary Report / Loop Recorder 

## 2019-08-09 DIAGNOSIS — I6930 Unspecified sequelae of cerebral infarction: Secondary | ICD-10-CM | POA: Diagnosis not present

## 2019-08-09 DIAGNOSIS — Z741 Need for assistance with personal care: Secondary | ICD-10-CM | POA: Diagnosis not present

## 2019-08-09 DIAGNOSIS — R41841 Cognitive communication deficit: Secondary | ICD-10-CM | POA: Diagnosis not present

## 2019-08-10 DIAGNOSIS — R41841 Cognitive communication deficit: Secondary | ICD-10-CM | POA: Diagnosis not present

## 2019-08-10 DIAGNOSIS — Z741 Need for assistance with personal care: Secondary | ICD-10-CM | POA: Diagnosis not present

## 2019-08-10 DIAGNOSIS — I6930 Unspecified sequelae of cerebral infarction: Secondary | ICD-10-CM | POA: Diagnosis not present

## 2019-08-11 DIAGNOSIS — I6930 Unspecified sequelae of cerebral infarction: Secondary | ICD-10-CM | POA: Diagnosis not present

## 2019-08-11 DIAGNOSIS — Z741 Need for assistance with personal care: Secondary | ICD-10-CM | POA: Diagnosis not present

## 2019-08-11 DIAGNOSIS — R41841 Cognitive communication deficit: Secondary | ICD-10-CM | POA: Diagnosis not present

## 2019-08-11 IMAGING — US US CAROTID DUPLEX BILAT
1 series · 13 of 24 positions shown · non-contrast
Comparison: Prior duplex carotid ultrasound 10/02/2016

CLINICAL DATA: 51-year-old female with a history of right-sided
cerebrovascular accident

EXAM:
BILATERAL CAROTID DUPLEX ULTRASOUND
TECHNIQUE: Gray scale imaging, color Doppler and duplex ultrasound were
performed of bilateral carotid and vertebral arteries in the neck.

[Series 1: us carotid duplex bilat · 13 of 69 slices shown]
[im 1/69]
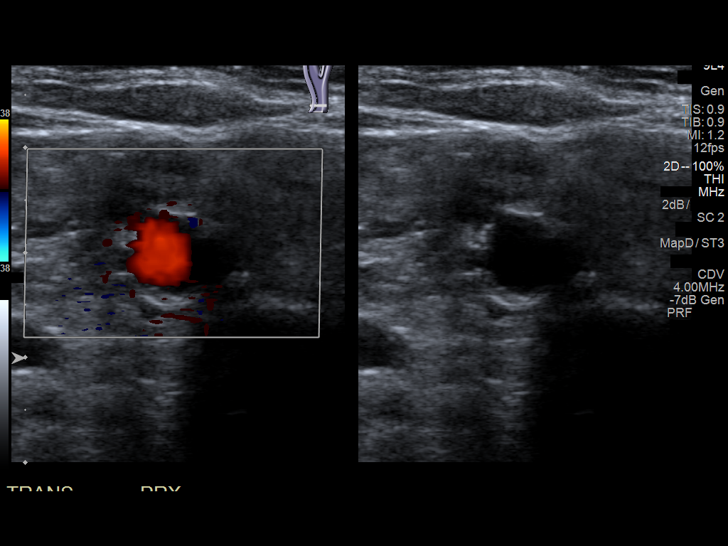
[im 6/69]
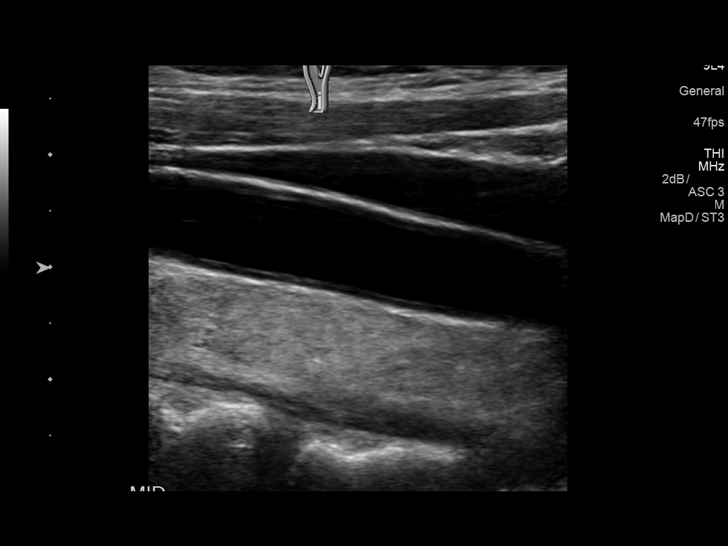
[im 12/69]
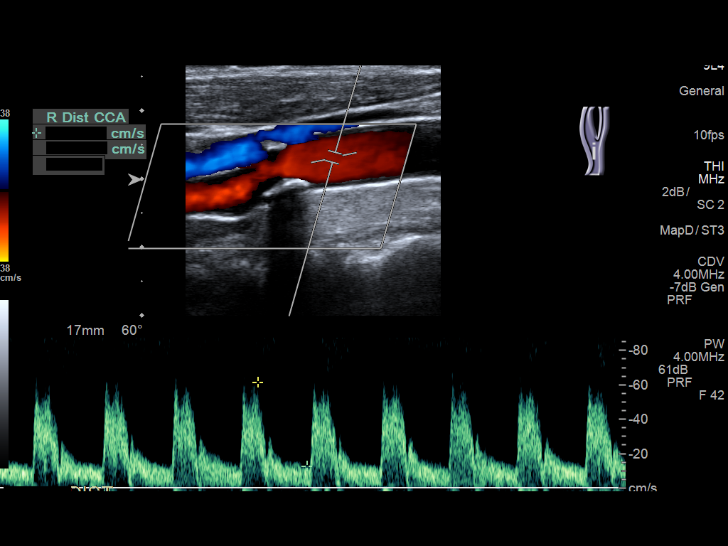
[im 18/69]
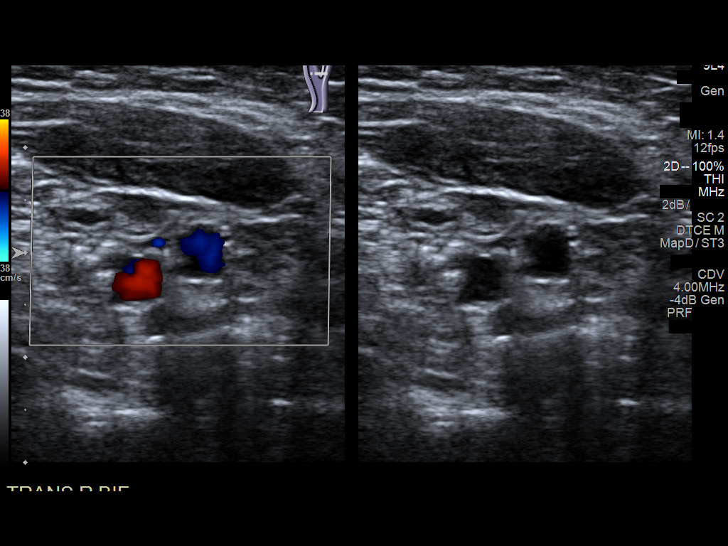
[im 24/69]
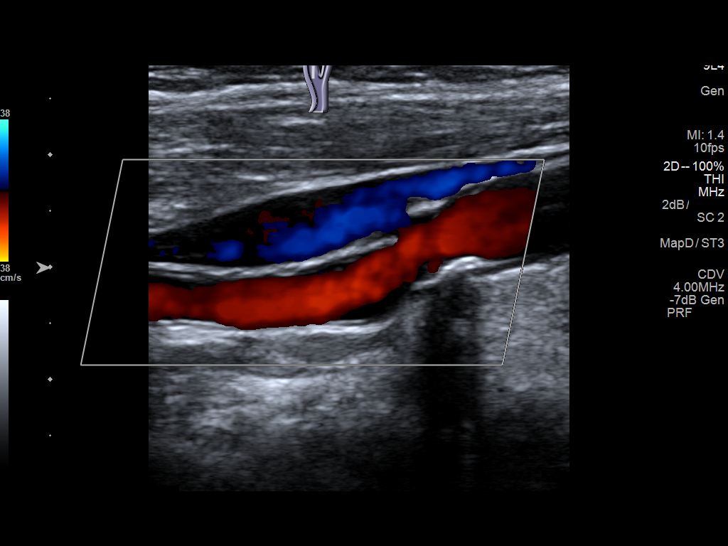
[im 30/69]
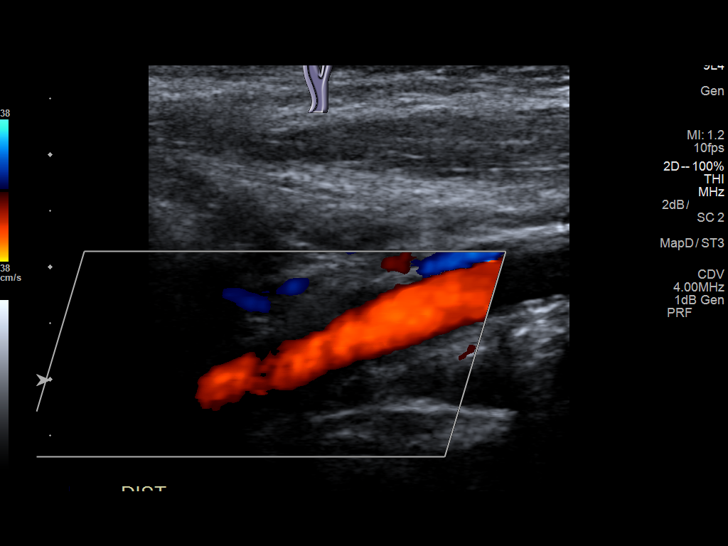
[im 36/69]
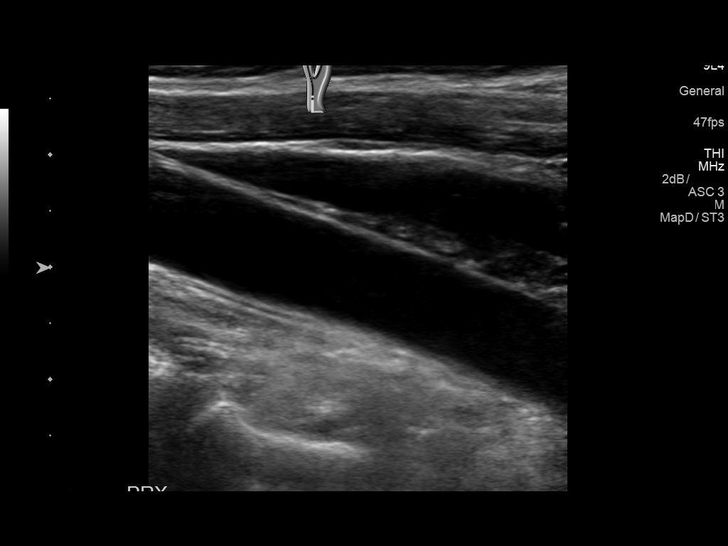
[im 39/69]
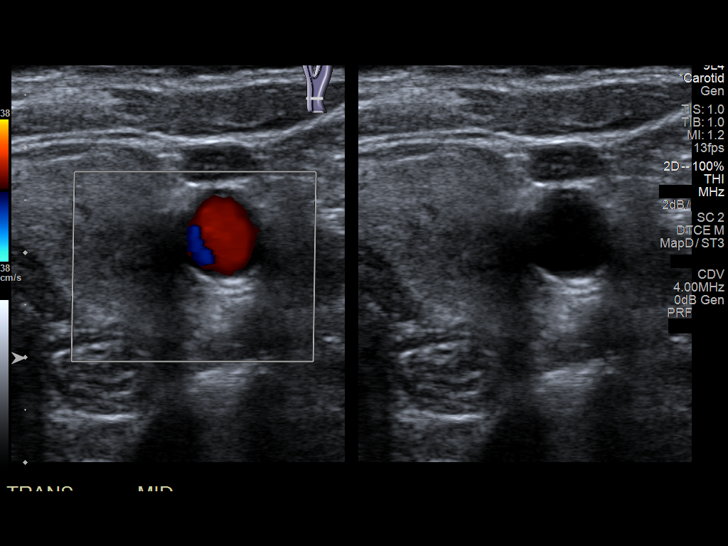
[im 45/69]
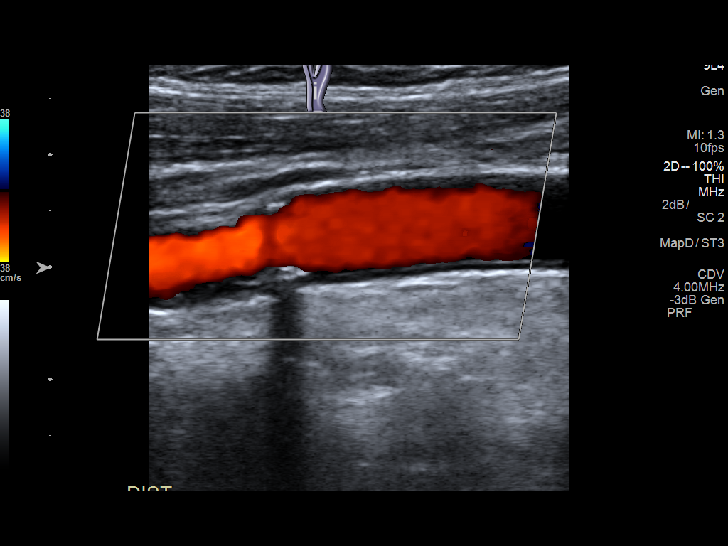
[im 51/69]
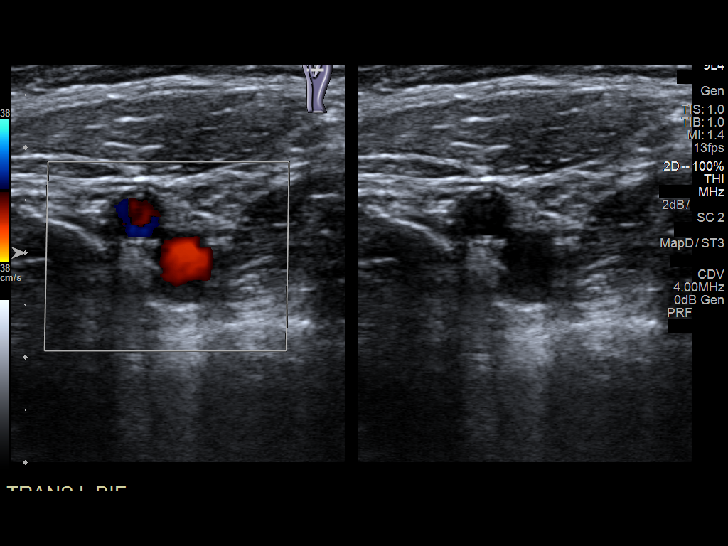
[im 57/69]
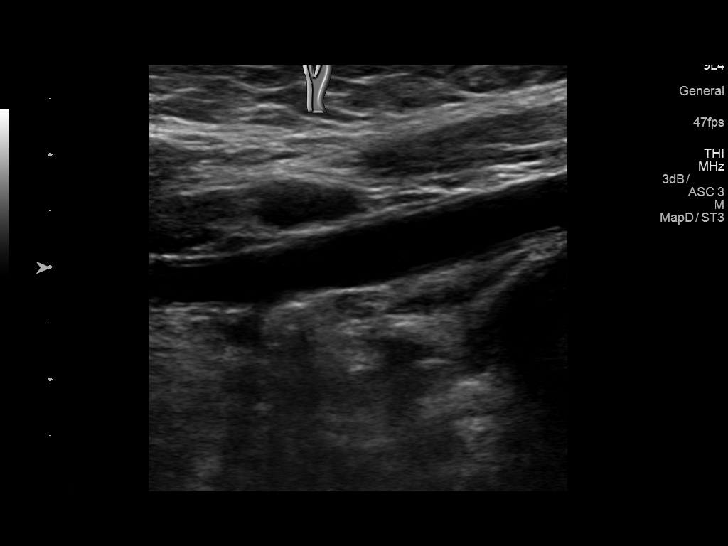
[im 63/69]
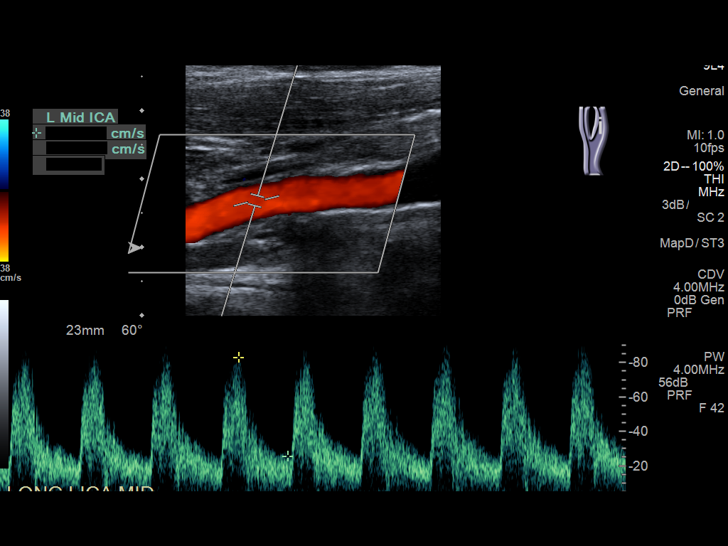
[im 69/69]
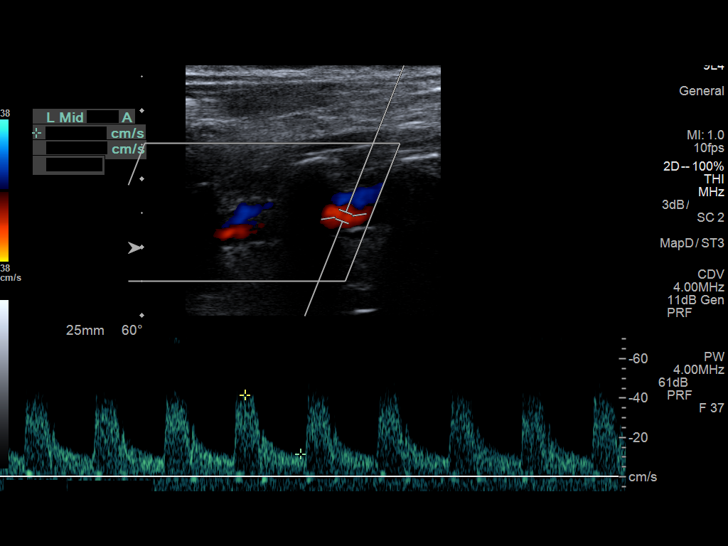

[13 of 24 positions shown; findings below may reference images not displayed]

FINDINGS: Criteria: Quantification of carotid stenosis is based on velocity
parameters that correlate the residual internal carotid diameter
with NASCET-based stenosis levels, using the diameter of the distal
internal carotid lumen as the denominator for stenosis measurement.

The following velocity measurements were obtained:

RIGHT
ICA: 115/30 cm/sec
CCA: 86/15 cm/sec

SYSTOLIC ICA/CCA RATIO:

ECA:  93 cm/sec

LEFT

ICA: 98/30 cm/sec

CCA: 86/16 cm/sec

SYSTOLIC ICA/CCA RATIO:

ECA:  122 cm/sec

RIGHT CAROTID ARTERY: Moderate heterogeneous and slightly irregular
atherosclerotic plaque in the proximal internal carotid artery. By
peak systolic velocity criteria, the estimated stenosis remains less
than 50%.

RIGHT VERTEBRAL ARTERY:  Patent with normal antegrade flow.

LEFT CAROTID ARTERY: Trace heterogeneous and relatively smooth
atherosclerotic plaque in the proximal internal carotid artery. By
peak systolic velocity criteria, the estimated stenosis remains less
than 50%.

LEFT VERTEBRAL ARTERY:  Patent with normal antegrade flow.
IMPRESSION: 1. Mild (1-49%) stenosis proximal right internal carotid artery
secondary to moderate heterogeneous and slightly irregular
atherosclerotic plaque.
2. Mild (1-49%) stenosis proximal left internal carotid artery
secondary to minimal heterogeneous but smooth atherosclerotic
plaque.
3. Vertebral arteries are patent with normal antegrade flow.

## 2019-08-13 DIAGNOSIS — Z741 Need for assistance with personal care: Secondary | ICD-10-CM | POA: Diagnosis not present

## 2019-08-13 DIAGNOSIS — I6930 Unspecified sequelae of cerebral infarction: Secondary | ICD-10-CM | POA: Diagnosis not present

## 2019-08-13 DIAGNOSIS — R41841 Cognitive communication deficit: Secondary | ICD-10-CM | POA: Diagnosis not present

## 2019-08-14 DIAGNOSIS — I6930 Unspecified sequelae of cerebral infarction: Secondary | ICD-10-CM | POA: Diagnosis not present

## 2019-08-14 DIAGNOSIS — Z741 Need for assistance with personal care: Secondary | ICD-10-CM | POA: Diagnosis not present

## 2019-08-14 DIAGNOSIS — R41841 Cognitive communication deficit: Secondary | ICD-10-CM | POA: Diagnosis not present

## 2019-08-15 DIAGNOSIS — F329 Major depressive disorder, single episode, unspecified: Secondary | ICD-10-CM | POA: Diagnosis not present

## 2019-08-15 DIAGNOSIS — D500 Iron deficiency anemia secondary to blood loss (chronic): Secondary | ICD-10-CM | POA: Diagnosis not present

## 2019-08-15 DIAGNOSIS — I6330 Cerebral infarction due to thrombosis of unspecified cerebral artery: Secondary | ICD-10-CM | POA: Diagnosis not present

## 2019-08-15 DIAGNOSIS — K5900 Constipation, unspecified: Secondary | ICD-10-CM | POA: Diagnosis not present

## 2019-08-15 DIAGNOSIS — N390 Urinary tract infection, site not specified: Secondary | ICD-10-CM | POA: Diagnosis not present

## 2019-08-15 DIAGNOSIS — E785 Hyperlipidemia, unspecified: Secondary | ICD-10-CM | POA: Diagnosis not present

## 2019-08-15 DIAGNOSIS — I6930 Unspecified sequelae of cerebral infarction: Secondary | ICD-10-CM | POA: Diagnosis not present

## 2019-08-15 DIAGNOSIS — I69391 Dysphagia following cerebral infarction: Secondary | ICD-10-CM | POA: Diagnosis not present

## 2019-08-15 DIAGNOSIS — R2681 Unsteadiness on feet: Secondary | ICD-10-CM | POA: Diagnosis not present

## 2019-08-15 DIAGNOSIS — F0390 Unspecified dementia without behavioral disturbance: Secondary | ICD-10-CM | POA: Diagnosis not present

## 2019-08-15 DIAGNOSIS — F319 Bipolar disorder, unspecified: Secondary | ICD-10-CM | POA: Diagnosis not present

## 2019-08-15 DIAGNOSIS — F0150 Vascular dementia without behavioral disturbance: Secondary | ICD-10-CM | POA: Diagnosis not present

## 2019-08-15 DIAGNOSIS — R1312 Dysphagia, oropharyngeal phase: Secondary | ICD-10-CM | POA: Diagnosis not present

## 2019-08-15 DIAGNOSIS — R519 Headache, unspecified: Secondary | ICD-10-CM | POA: Diagnosis not present

## 2019-08-15 DIAGNOSIS — I69390 Apraxia following cerebral infarction: Secondary | ICD-10-CM | POA: Diagnosis not present

## 2019-08-15 DIAGNOSIS — M6281 Muscle weakness (generalized): Secondary | ICD-10-CM | POA: Diagnosis not present

## 2019-08-15 DIAGNOSIS — F330 Major depressive disorder, recurrent, mild: Secondary | ICD-10-CM | POA: Diagnosis not present

## 2019-08-15 DIAGNOSIS — R262 Difficulty in walking, not elsewhere classified: Secondary | ICD-10-CM | POA: Diagnosis not present

## 2019-08-15 DIAGNOSIS — F339 Major depressive disorder, recurrent, unspecified: Secondary | ICD-10-CM | POA: Diagnosis not present

## 2019-08-15 DIAGNOSIS — E1140 Type 2 diabetes mellitus with diabetic neuropathy, unspecified: Secondary | ICD-10-CM | POA: Diagnosis not present

## 2019-08-15 DIAGNOSIS — E46 Unspecified protein-calorie malnutrition: Secondary | ICD-10-CM | POA: Diagnosis not present

## 2019-08-15 DIAGNOSIS — I10 Essential (primary) hypertension: Secondary | ICD-10-CM | POA: Diagnosis not present

## 2019-08-15 DIAGNOSIS — I639 Cerebral infarction, unspecified: Secondary | ICD-10-CM | POA: Diagnosis not present

## 2019-08-15 DIAGNOSIS — E1165 Type 2 diabetes mellitus with hyperglycemia: Secondary | ICD-10-CM | POA: Diagnosis not present

## 2019-08-15 DIAGNOSIS — R279 Unspecified lack of coordination: Secondary | ICD-10-CM | POA: Diagnosis not present

## 2019-08-15 DIAGNOSIS — F419 Anxiety disorder, unspecified: Secondary | ICD-10-CM | POA: Diagnosis not present

## 2019-08-15 DIAGNOSIS — R482 Apraxia: Secondary | ICD-10-CM | POA: Diagnosis not present

## 2019-08-15 DIAGNOSIS — Z741 Need for assistance with personal care: Secondary | ICD-10-CM | POA: Diagnosis not present

## 2019-08-15 DIAGNOSIS — E8809 Other disorders of plasma-protein metabolism, not elsewhere classified: Secondary | ICD-10-CM | POA: Diagnosis not present

## 2019-08-15 DIAGNOSIS — I69320 Aphasia following cerebral infarction: Secondary | ICD-10-CM | POA: Diagnosis not present

## 2019-08-15 DIAGNOSIS — G4700 Insomnia, unspecified: Secondary | ICD-10-CM | POA: Diagnosis not present

## 2019-08-15 DIAGNOSIS — I69851 Hemiplegia and hemiparesis following other cerebrovascular disease affecting right dominant side: Secondary | ICD-10-CM | POA: Diagnosis not present

## 2019-08-17 DIAGNOSIS — K5900 Constipation, unspecified: Secondary | ICD-10-CM | POA: Diagnosis not present

## 2019-08-17 DIAGNOSIS — G4700 Insomnia, unspecified: Secondary | ICD-10-CM | POA: Diagnosis not present

## 2019-08-17 DIAGNOSIS — E1140 Type 2 diabetes mellitus with diabetic neuropathy, unspecified: Secondary | ICD-10-CM | POA: Diagnosis not present

## 2019-08-17 DIAGNOSIS — F330 Major depressive disorder, recurrent, mild: Secondary | ICD-10-CM | POA: Diagnosis not present

## 2019-08-26 DIAGNOSIS — I10 Essential (primary) hypertension: Secondary | ICD-10-CM | POA: Diagnosis not present

## 2019-08-26 DIAGNOSIS — E1165 Type 2 diabetes mellitus with hyperglycemia: Secondary | ICD-10-CM | POA: Diagnosis not present

## 2019-08-26 DIAGNOSIS — E785 Hyperlipidemia, unspecified: Secondary | ICD-10-CM | POA: Diagnosis not present

## 2019-08-26 DIAGNOSIS — I639 Cerebral infarction, unspecified: Secondary | ICD-10-CM | POA: Diagnosis not present

## 2019-08-26 DIAGNOSIS — F329 Major depressive disorder, single episode, unspecified: Secondary | ICD-10-CM | POA: Diagnosis not present

## 2019-08-26 DIAGNOSIS — F0150 Vascular dementia without behavioral disturbance: Secondary | ICD-10-CM | POA: Diagnosis not present

## 2019-08-26 DIAGNOSIS — F330 Major depressive disorder, recurrent, mild: Secondary | ICD-10-CM | POA: Diagnosis not present

## 2019-08-26 DIAGNOSIS — E1140 Type 2 diabetes mellitus with diabetic neuropathy, unspecified: Secondary | ICD-10-CM | POA: Diagnosis not present

## 2019-09-11 ENCOUNTER — Ambulatory Visit (INDEPENDENT_AMBULATORY_CARE_PROVIDER_SITE_OTHER): Payer: Medicare Other | Admitting: *Deleted

## 2019-09-11 DIAGNOSIS — I6330 Cerebral infarction due to thrombosis of unspecified cerebral artery: Secondary | ICD-10-CM

## 2019-09-11 LAB — CUP PACEART REMOTE DEVICE CHECK
Date Time Interrogation Session: 20210627231736
Implantable Pulse Generator Implant Date: 20190430

## 2019-09-13 NOTE — Progress Notes (Signed)
Carelink Summary Report / Loop Recorder 

## 2019-09-14 DIAGNOSIS — I6930 Unspecified sequelae of cerebral infarction: Secondary | ICD-10-CM | POA: Diagnosis not present

## 2019-09-14 DIAGNOSIS — Z741 Need for assistance with personal care: Secondary | ICD-10-CM | POA: Diagnosis not present

## 2019-09-17 DIAGNOSIS — Z741 Need for assistance with personal care: Secondary | ICD-10-CM | POA: Diagnosis not present

## 2019-09-17 DIAGNOSIS — I6930 Unspecified sequelae of cerebral infarction: Secondary | ICD-10-CM | POA: Diagnosis not present

## 2019-09-18 DIAGNOSIS — I6930 Unspecified sequelae of cerebral infarction: Secondary | ICD-10-CM | POA: Diagnosis not present

## 2019-09-18 DIAGNOSIS — Z741 Need for assistance with personal care: Secondary | ICD-10-CM | POA: Diagnosis not present

## 2019-09-19 DIAGNOSIS — I6930 Unspecified sequelae of cerebral infarction: Secondary | ICD-10-CM | POA: Diagnosis not present

## 2019-09-19 DIAGNOSIS — Z741 Need for assistance with personal care: Secondary | ICD-10-CM | POA: Diagnosis not present

## 2019-09-20 DIAGNOSIS — R0902 Hypoxemia: Secondary | ICD-10-CM | POA: Diagnosis not present

## 2019-09-20 DIAGNOSIS — E785 Hyperlipidemia, unspecified: Secondary | ICD-10-CM | POA: Diagnosis not present

## 2019-09-20 DIAGNOSIS — G9341 Metabolic encephalopathy: Secondary | ICD-10-CM | POA: Diagnosis not present

## 2019-09-20 DIAGNOSIS — R52 Pain, unspecified: Secondary | ICD-10-CM | POA: Diagnosis not present

## 2019-09-20 DIAGNOSIS — E440 Moderate protein-calorie malnutrition: Secondary | ICD-10-CM | POA: Diagnosis not present

## 2019-09-20 DIAGNOSIS — G459 Transient cerebral ischemic attack, unspecified: Secondary | ICD-10-CM | POA: Diagnosis not present

## 2019-09-20 DIAGNOSIS — I69398 Other sequelae of cerebral infarction: Secondary | ICD-10-CM | POA: Diagnosis not present

## 2019-09-20 DIAGNOSIS — I69322 Dysarthria following cerebral infarction: Secondary | ICD-10-CM | POA: Diagnosis not present

## 2019-09-20 DIAGNOSIS — N179 Acute kidney failure, unspecified: Secondary | ICD-10-CM | POA: Diagnosis not present

## 2019-09-20 DIAGNOSIS — G4489 Other headache syndrome: Secondary | ICD-10-CM | POA: Diagnosis not present

## 2019-09-20 DIAGNOSIS — B9620 Unspecified Escherichia coli [E. coli] as the cause of diseases classified elsewhere: Secondary | ICD-10-CM | POA: Diagnosis not present

## 2019-09-20 DIAGNOSIS — F319 Bipolar disorder, unspecified: Secondary | ICD-10-CM | POA: Diagnosis not present

## 2019-09-20 DIAGNOSIS — N3000 Acute cystitis without hematuria: Secondary | ICD-10-CM | POA: Diagnosis not present

## 2019-09-20 DIAGNOSIS — I951 Orthostatic hypotension: Secondary | ICD-10-CM | POA: Diagnosis not present

## 2019-09-20 DIAGNOSIS — Z6836 Body mass index (BMI) 36.0-36.9, adult: Secondary | ICD-10-CM | POA: Diagnosis not present

## 2019-09-20 DIAGNOSIS — D649 Anemia, unspecified: Secondary | ICD-10-CM | POA: Diagnosis not present

## 2019-09-20 DIAGNOSIS — D759 Disease of blood and blood-forming organs, unspecified: Secondary | ICD-10-CM | POA: Diagnosis not present

## 2019-09-20 DIAGNOSIS — R1310 Dysphagia, unspecified: Secondary | ICD-10-CM | POA: Diagnosis not present

## 2019-09-20 DIAGNOSIS — K5909 Other constipation: Secondary | ICD-10-CM | POA: Diagnosis not present

## 2019-09-20 DIAGNOSIS — I959 Hypotension, unspecified: Secondary | ICD-10-CM | POA: Diagnosis not present

## 2019-09-20 DIAGNOSIS — E119 Type 2 diabetes mellitus without complications: Secondary | ICD-10-CM | POA: Diagnosis not present

## 2019-09-20 DIAGNOSIS — I69331 Monoplegia of upper limb following cerebral infarction affecting right dominant side: Secondary | ICD-10-CM | POA: Diagnosis not present

## 2019-09-20 DIAGNOSIS — G9340 Encephalopathy, unspecified: Secondary | ICD-10-CM | POA: Diagnosis not present

## 2019-09-20 DIAGNOSIS — I10 Essential (primary) hypertension: Secondary | ICD-10-CM | POA: Diagnosis not present

## 2019-09-20 DIAGNOSIS — G43909 Migraine, unspecified, not intractable, without status migrainosus: Secondary | ICD-10-CM | POA: Diagnosis not present

## 2019-09-20 DIAGNOSIS — I69391 Dysphagia following cerebral infarction: Secondary | ICD-10-CM | POA: Diagnosis not present

## 2019-09-20 DIAGNOSIS — R4182 Altered mental status, unspecified: Secondary | ICD-10-CM | POA: Diagnosis not present

## 2019-09-20 DIAGNOSIS — R9082 White matter disease, unspecified: Secondary | ICD-10-CM | POA: Diagnosis not present

## 2019-09-20 DIAGNOSIS — Z1612 Extended spectrum beta lactamase (ESBL) resistance: Secondary | ICD-10-CM | POA: Diagnosis not present

## 2019-09-20 DIAGNOSIS — R404 Transient alteration of awareness: Secondary | ICD-10-CM | POA: Diagnosis not present

## 2019-09-20 DIAGNOSIS — Z20822 Contact with and (suspected) exposure to covid-19: Secondary | ICD-10-CM | POA: Diagnosis not present

## 2019-09-20 DIAGNOSIS — G9389 Other specified disorders of brain: Secondary | ICD-10-CM | POA: Diagnosis not present

## 2019-09-20 DIAGNOSIS — Z79899 Other long term (current) drug therapy: Secondary | ICD-10-CM | POA: Diagnosis not present

## 2019-09-21 DIAGNOSIS — R2689 Other abnormalities of gait and mobility: Secondary | ICD-10-CM | POA: Diagnosis not present

## 2019-09-21 DIAGNOSIS — E119 Type 2 diabetes mellitus without complications: Secondary | ICD-10-CM | POA: Diagnosis not present

## 2019-09-21 DIAGNOSIS — R4182 Altered mental status, unspecified: Secondary | ICD-10-CM | POA: Diagnosis not present

## 2019-09-21 DIAGNOSIS — I69331 Monoplegia of upper limb following cerebral infarction affecting right dominant side: Secondary | ICD-10-CM | POA: Diagnosis not present

## 2019-09-21 DIAGNOSIS — G9340 Encephalopathy, unspecified: Secondary | ICD-10-CM | POA: Diagnosis not present

## 2019-09-21 DIAGNOSIS — G9341 Metabolic encephalopathy: Secondary | ICD-10-CM | POA: Diagnosis not present

## 2019-09-21 DIAGNOSIS — B9620 Unspecified Escherichia coli [E. coli] as the cause of diseases classified elsewhere: Secondary | ICD-10-CM | POA: Diagnosis not present

## 2019-09-21 DIAGNOSIS — I10 Essential (primary) hypertension: Secondary | ICD-10-CM | POA: Diagnosis not present

## 2019-09-21 DIAGNOSIS — F05 Delirium due to known physiological condition: Secondary | ICD-10-CM | POA: Diagnosis not present

## 2019-09-21 DIAGNOSIS — Z1612 Extended spectrum beta lactamase (ESBL) resistance: Secondary | ICD-10-CM | POA: Diagnosis not present

## 2019-09-21 DIAGNOSIS — N3000 Acute cystitis without hematuria: Secondary | ICD-10-CM | POA: Diagnosis not present

## 2019-09-21 DIAGNOSIS — N179 Acute kidney failure, unspecified: Secondary | ICD-10-CM | POA: Diagnosis not present

## 2019-09-21 DIAGNOSIS — I951 Orthostatic hypotension: Secondary | ICD-10-CM | POA: Diagnosis not present

## 2019-09-21 DIAGNOSIS — I679 Cerebrovascular disease, unspecified: Secondary | ICD-10-CM | POA: Diagnosis not present

## 2019-09-22 DIAGNOSIS — K5900 Constipation, unspecified: Secondary | ICD-10-CM | POA: Diagnosis not present

## 2019-09-22 DIAGNOSIS — Z79899 Other long term (current) drug therapy: Secondary | ICD-10-CM | POA: Diagnosis not present

## 2019-09-22 DIAGNOSIS — G4700 Insomnia, unspecified: Secondary | ICD-10-CM | POA: Diagnosis not present

## 2019-09-22 DIAGNOSIS — Z7401 Bed confinement status: Secondary | ICD-10-CM | POA: Diagnosis not present

## 2019-09-22 DIAGNOSIS — E785 Hyperlipidemia, unspecified: Secondary | ICD-10-CM | POA: Diagnosis not present

## 2019-09-22 DIAGNOSIS — M47816 Spondylosis without myelopathy or radiculopathy, lumbar region: Secondary | ICD-10-CM | POA: Diagnosis not present

## 2019-09-22 DIAGNOSIS — F419 Anxiety disorder, unspecified: Secondary | ICD-10-CM | POA: Diagnosis not present

## 2019-09-22 DIAGNOSIS — M79601 Pain in right arm: Secondary | ICD-10-CM | POA: Diagnosis not present

## 2019-09-22 DIAGNOSIS — M8588 Other specified disorders of bone density and structure, other site: Secondary | ICD-10-CM | POA: Diagnosis not present

## 2019-09-22 DIAGNOSIS — A539 Syphilis, unspecified: Secondary | ICD-10-CM | POA: Diagnosis not present

## 2019-09-22 DIAGNOSIS — F0390 Unspecified dementia without behavioral disturbance: Secondary | ICD-10-CM | POA: Diagnosis not present

## 2019-09-22 DIAGNOSIS — M79631 Pain in right forearm: Secondary | ICD-10-CM | POA: Diagnosis not present

## 2019-09-22 DIAGNOSIS — R482 Apraxia: Secondary | ICD-10-CM | POA: Diagnosis not present

## 2019-09-22 DIAGNOSIS — D649 Anemia, unspecified: Secondary | ICD-10-CM | POA: Diagnosis not present

## 2019-09-22 DIAGNOSIS — F05 Delirium due to known physiological condition: Secondary | ICD-10-CM | POA: Diagnosis not present

## 2019-09-22 DIAGNOSIS — E8809 Other disorders of plasma-protein metabolism, not elsewhere classified: Secondary | ICD-10-CM | POA: Diagnosis not present

## 2019-09-22 DIAGNOSIS — N179 Acute kidney failure, unspecified: Secondary | ICD-10-CM | POA: Diagnosis not present

## 2019-09-22 DIAGNOSIS — I69391 Dysphagia following cerebral infarction: Secondary | ICD-10-CM | POA: Diagnosis not present

## 2019-09-22 DIAGNOSIS — I6930 Unspecified sequelae of cerebral infarction: Secondary | ICD-10-CM | POA: Diagnosis not present

## 2019-09-22 DIAGNOSIS — R109 Unspecified abdominal pain: Secondary | ICD-10-CM | POA: Diagnosis not present

## 2019-09-22 DIAGNOSIS — M6281 Muscle weakness (generalized): Secondary | ICD-10-CM | POA: Diagnosis not present

## 2019-09-22 DIAGNOSIS — R2681 Unsteadiness on feet: Secondary | ICD-10-CM | POA: Diagnosis not present

## 2019-09-22 DIAGNOSIS — M85831 Other specified disorders of bone density and structure, right forearm: Secondary | ICD-10-CM | POA: Diagnosis not present

## 2019-09-22 DIAGNOSIS — K5909 Other constipation: Secondary | ICD-10-CM | POA: Diagnosis not present

## 2019-09-22 DIAGNOSIS — G4709 Other insomnia: Secondary | ICD-10-CM | POA: Diagnosis not present

## 2019-09-22 DIAGNOSIS — E039 Hypothyroidism, unspecified: Secondary | ICD-10-CM | POA: Diagnosis not present

## 2019-09-22 DIAGNOSIS — F0150 Vascular dementia without behavioral disturbance: Secondary | ICD-10-CM | POA: Diagnosis not present

## 2019-09-22 DIAGNOSIS — E46 Unspecified protein-calorie malnutrition: Secondary | ICD-10-CM | POA: Diagnosis not present

## 2019-09-22 DIAGNOSIS — E550 Rickets, active: Secondary | ICD-10-CM | POA: Diagnosis not present

## 2019-09-22 DIAGNOSIS — F339 Major depressive disorder, recurrent, unspecified: Secondary | ICD-10-CM | POA: Diagnosis not present

## 2019-09-22 DIAGNOSIS — E1165 Type 2 diabetes mellitus with hyperglycemia: Secondary | ICD-10-CM | POA: Diagnosis not present

## 2019-09-22 DIAGNOSIS — D500 Iron deficiency anemia secondary to blood loss (chronic): Secondary | ICD-10-CM | POA: Diagnosis not present

## 2019-09-22 DIAGNOSIS — I69320 Aphasia following cerebral infarction: Secondary | ICD-10-CM | POA: Diagnosis not present

## 2019-09-22 DIAGNOSIS — R519 Headache, unspecified: Secondary | ICD-10-CM | POA: Diagnosis not present

## 2019-09-22 DIAGNOSIS — F330 Major depressive disorder, recurrent, mild: Secondary | ICD-10-CM | POA: Diagnosis not present

## 2019-09-22 DIAGNOSIS — E1159 Type 2 diabetes mellitus with other circulatory complications: Secondary | ICD-10-CM | POA: Diagnosis not present

## 2019-09-22 DIAGNOSIS — F329 Major depressive disorder, single episode, unspecified: Secondary | ICD-10-CM | POA: Diagnosis not present

## 2019-09-22 DIAGNOSIS — I69390 Apraxia following cerebral infarction: Secondary | ICD-10-CM | POA: Diagnosis not present

## 2019-09-22 DIAGNOSIS — M47814 Spondylosis without myelopathy or radiculopathy, thoracic region: Secondary | ICD-10-CM | POA: Diagnosis not present

## 2019-09-22 DIAGNOSIS — N390 Urinary tract infection, site not specified: Secondary | ICD-10-CM | POA: Diagnosis not present

## 2019-09-22 DIAGNOSIS — G9340 Encephalopathy, unspecified: Secondary | ICD-10-CM | POA: Diagnosis not present

## 2019-09-22 DIAGNOSIS — R262 Difficulty in walking, not elsewhere classified: Secondary | ICD-10-CM | POA: Diagnosis not present

## 2019-09-22 DIAGNOSIS — R413 Other amnesia: Secondary | ICD-10-CM | POA: Diagnosis not present

## 2019-09-22 DIAGNOSIS — G9341 Metabolic encephalopathy: Secondary | ICD-10-CM | POA: Diagnosis not present

## 2019-09-22 DIAGNOSIS — R410 Disorientation, unspecified: Secondary | ICD-10-CM | POA: Diagnosis not present

## 2019-09-22 DIAGNOSIS — I69851 Hemiplegia and hemiparesis following other cerebrovascular disease affecting right dominant side: Secondary | ICD-10-CM | POA: Diagnosis not present

## 2019-09-22 DIAGNOSIS — E1140 Type 2 diabetes mellitus with diabetic neuropathy, unspecified: Secondary | ICD-10-CM | POA: Diagnosis not present

## 2019-09-22 DIAGNOSIS — R2689 Other abnormalities of gait and mobility: Secondary | ICD-10-CM | POA: Diagnosis not present

## 2019-09-22 DIAGNOSIS — E119 Type 2 diabetes mellitus without complications: Secondary | ICD-10-CM | POA: Diagnosis not present

## 2019-09-22 DIAGNOSIS — R1312 Dysphagia, oropharyngeal phase: Secondary | ICD-10-CM | POA: Diagnosis not present

## 2019-09-22 DIAGNOSIS — I951 Orthostatic hypotension: Secondary | ICD-10-CM | POA: Diagnosis not present

## 2019-09-22 DIAGNOSIS — R296 Repeated falls: Secondary | ICD-10-CM | POA: Diagnosis not present

## 2019-09-22 DIAGNOSIS — I679 Cerebrovascular disease, unspecified: Secondary | ICD-10-CM | POA: Diagnosis not present

## 2019-09-22 DIAGNOSIS — B9629 Other Escherichia coli [E. coli] as the cause of diseases classified elsewhere: Secondary | ICD-10-CM | POA: Diagnosis not present

## 2019-09-22 DIAGNOSIS — Z23 Encounter for immunization: Secondary | ICD-10-CM | POA: Diagnosis not present

## 2019-09-22 DIAGNOSIS — B351 Tinea unguium: Secondary | ICD-10-CM | POA: Diagnosis not present

## 2019-09-22 DIAGNOSIS — R279 Unspecified lack of coordination: Secondary | ICD-10-CM | POA: Diagnosis not present

## 2019-09-22 DIAGNOSIS — Z1612 Extended spectrum beta lactamase (ESBL) resistance: Secondary | ICD-10-CM | POA: Diagnosis not present

## 2019-09-22 DIAGNOSIS — M545 Low back pain: Secondary | ICD-10-CM | POA: Diagnosis not present

## 2019-09-22 DIAGNOSIS — I10 Essential (primary) hypertension: Secondary | ICD-10-CM | POA: Diagnosis not present

## 2019-09-22 DIAGNOSIS — Z741 Need for assistance with personal care: Secondary | ICD-10-CM | POA: Diagnosis not present

## 2019-09-22 DIAGNOSIS — I639 Cerebral infarction, unspecified: Secondary | ICD-10-CM | POA: Diagnosis not present

## 2019-09-22 DIAGNOSIS — B9620 Unspecified Escherichia coli [E. coli] as the cause of diseases classified elsewhere: Secondary | ICD-10-CM | POA: Diagnosis not present

## 2019-09-22 DIAGNOSIS — B373 Candidiasis of vulva and vagina: Secondary | ICD-10-CM | POA: Diagnosis not present

## 2019-09-22 DIAGNOSIS — R4182 Altered mental status, unspecified: Secondary | ICD-10-CM | POA: Diagnosis not present

## 2019-09-22 DIAGNOSIS — M25551 Pain in right hip: Secondary | ICD-10-CM | POA: Diagnosis not present

## 2019-09-22 DIAGNOSIS — F319 Bipolar disorder, unspecified: Secondary | ICD-10-CM | POA: Diagnosis not present

## 2019-09-22 DIAGNOSIS — N3000 Acute cystitis without hematuria: Secondary | ICD-10-CM | POA: Diagnosis not present

## 2019-09-26 DIAGNOSIS — B9629 Other Escherichia coli [E. coli] as the cause of diseases classified elsewhere: Secondary | ICD-10-CM | POA: Diagnosis not present

## 2019-09-26 DIAGNOSIS — N390 Urinary tract infection, site not specified: Secondary | ICD-10-CM | POA: Diagnosis not present

## 2019-09-26 DIAGNOSIS — K5909 Other constipation: Secondary | ICD-10-CM | POA: Diagnosis not present

## 2019-09-28 DIAGNOSIS — F0150 Vascular dementia without behavioral disturbance: Secondary | ICD-10-CM | POA: Diagnosis not present

## 2019-09-28 DIAGNOSIS — E1140 Type 2 diabetes mellitus with diabetic neuropathy, unspecified: Secondary | ICD-10-CM | POA: Diagnosis not present

## 2019-09-28 DIAGNOSIS — E785 Hyperlipidemia, unspecified: Secondary | ICD-10-CM | POA: Diagnosis not present

## 2019-09-28 DIAGNOSIS — E1165 Type 2 diabetes mellitus with hyperglycemia: Secondary | ICD-10-CM | POA: Diagnosis not present

## 2019-09-28 DIAGNOSIS — I10 Essential (primary) hypertension: Secondary | ICD-10-CM | POA: Diagnosis not present

## 2019-09-28 DIAGNOSIS — F329 Major depressive disorder, single episode, unspecified: Secondary | ICD-10-CM | POA: Diagnosis not present

## 2019-09-28 DIAGNOSIS — F330 Major depressive disorder, recurrent, mild: Secondary | ICD-10-CM | POA: Diagnosis not present

## 2019-09-28 DIAGNOSIS — I639 Cerebral infarction, unspecified: Secondary | ICD-10-CM | POA: Diagnosis not present

## 2019-10-02 DIAGNOSIS — F319 Bipolar disorder, unspecified: Secondary | ICD-10-CM | POA: Diagnosis not present

## 2019-10-02 DIAGNOSIS — F329 Major depressive disorder, single episode, unspecified: Secondary | ICD-10-CM | POA: Diagnosis not present

## 2019-10-02 DIAGNOSIS — F419 Anxiety disorder, unspecified: Secondary | ICD-10-CM | POA: Diagnosis not present

## 2019-10-11 DIAGNOSIS — F319 Bipolar disorder, unspecified: Secondary | ICD-10-CM | POA: Diagnosis not present

## 2019-10-11 DIAGNOSIS — I639 Cerebral infarction, unspecified: Secondary | ICD-10-CM | POA: Diagnosis not present

## 2019-10-11 DIAGNOSIS — R2689 Other abnormalities of gait and mobility: Secondary | ICD-10-CM | POA: Diagnosis not present

## 2019-10-11 DIAGNOSIS — R413 Other amnesia: Secondary | ICD-10-CM | POA: Diagnosis not present

## 2019-10-13 DIAGNOSIS — E785 Hyperlipidemia, unspecified: Secondary | ICD-10-CM | POA: Diagnosis not present

## 2019-10-13 DIAGNOSIS — E1140 Type 2 diabetes mellitus with diabetic neuropathy, unspecified: Secondary | ICD-10-CM | POA: Diagnosis not present

## 2019-10-13 DIAGNOSIS — I10 Essential (primary) hypertension: Secondary | ICD-10-CM | POA: Diagnosis not present

## 2019-10-13 DIAGNOSIS — E1165 Type 2 diabetes mellitus with hyperglycemia: Secondary | ICD-10-CM | POA: Diagnosis not present

## 2019-10-17 DIAGNOSIS — R296 Repeated falls: Secondary | ICD-10-CM | POA: Diagnosis not present

## 2019-10-17 DIAGNOSIS — M79601 Pain in right arm: Secondary | ICD-10-CM | POA: Diagnosis not present

## 2019-10-17 DIAGNOSIS — M79631 Pain in right forearm: Secondary | ICD-10-CM | POA: Diagnosis not present

## 2019-10-17 DIAGNOSIS — M8588 Other specified disorders of bone density and structure, other site: Secondary | ICD-10-CM | POA: Diagnosis not present

## 2019-10-17 DIAGNOSIS — M47816 Spondylosis without myelopathy or radiculopathy, lumbar region: Secondary | ICD-10-CM | POA: Diagnosis not present

## 2019-10-17 DIAGNOSIS — M47814 Spondylosis without myelopathy or radiculopathy, thoracic region: Secondary | ICD-10-CM | POA: Diagnosis not present

## 2019-10-17 DIAGNOSIS — M85831 Other specified disorders of bone density and structure, right forearm: Secondary | ICD-10-CM | POA: Diagnosis not present

## 2019-10-24 DIAGNOSIS — I639 Cerebral infarction, unspecified: Secondary | ICD-10-CM | POA: Diagnosis not present

## 2019-10-24 DIAGNOSIS — I10 Essential (primary) hypertension: Secondary | ICD-10-CM | POA: Diagnosis not present

## 2019-10-24 DIAGNOSIS — K5900 Constipation, unspecified: Secondary | ICD-10-CM | POA: Diagnosis not present

## 2019-10-24 DIAGNOSIS — E785 Hyperlipidemia, unspecified: Secondary | ICD-10-CM | POA: Diagnosis not present

## 2019-10-24 DIAGNOSIS — D649 Anemia, unspecified: Secondary | ICD-10-CM | POA: Diagnosis not present

## 2019-10-24 DIAGNOSIS — E1165 Type 2 diabetes mellitus with hyperglycemia: Secondary | ICD-10-CM | POA: Diagnosis not present

## 2019-10-26 DIAGNOSIS — F329 Major depressive disorder, single episode, unspecified: Secondary | ICD-10-CM | POA: Diagnosis not present

## 2019-10-26 DIAGNOSIS — E1165 Type 2 diabetes mellitus with hyperglycemia: Secondary | ICD-10-CM | POA: Diagnosis not present

## 2019-10-26 DIAGNOSIS — E1140 Type 2 diabetes mellitus with diabetic neuropathy, unspecified: Secondary | ICD-10-CM | POA: Diagnosis not present

## 2019-10-26 DIAGNOSIS — I639 Cerebral infarction, unspecified: Secondary | ICD-10-CM | POA: Diagnosis not present

## 2019-10-26 DIAGNOSIS — E785 Hyperlipidemia, unspecified: Secondary | ICD-10-CM | POA: Diagnosis not present

## 2019-10-26 DIAGNOSIS — F330 Major depressive disorder, recurrent, mild: Secondary | ICD-10-CM | POA: Diagnosis not present

## 2019-10-26 DIAGNOSIS — I10 Essential (primary) hypertension: Secondary | ICD-10-CM | POA: Diagnosis not present

## 2019-10-26 DIAGNOSIS — F0150 Vascular dementia without behavioral disturbance: Secondary | ICD-10-CM | POA: Diagnosis not present

## 2019-10-30 DIAGNOSIS — E119 Type 2 diabetes mellitus without complications: Secondary | ICD-10-CM | POA: Diagnosis not present

## 2019-10-30 DIAGNOSIS — E039 Hypothyroidism, unspecified: Secondary | ICD-10-CM | POA: Diagnosis not present

## 2019-10-30 DIAGNOSIS — Z79899 Other long term (current) drug therapy: Secondary | ICD-10-CM | POA: Diagnosis not present

## 2019-10-30 DIAGNOSIS — E550 Rickets, active: Secondary | ICD-10-CM | POA: Diagnosis not present

## 2019-10-30 DIAGNOSIS — M545 Low back pain: Secondary | ICD-10-CM | POA: Diagnosis not present

## 2019-10-30 DIAGNOSIS — A539 Syphilis, unspecified: Secondary | ICD-10-CM | POA: Diagnosis not present

## 2019-10-30 DIAGNOSIS — N390 Urinary tract infection, site not specified: Secondary | ICD-10-CM | POA: Diagnosis not present

## 2019-11-02 DIAGNOSIS — N390 Urinary tract infection, site not specified: Secondary | ICD-10-CM | POA: Diagnosis not present

## 2019-11-07 DIAGNOSIS — B373 Candidiasis of vulva and vagina: Secondary | ICD-10-CM | POA: Diagnosis not present

## 2019-11-09 DIAGNOSIS — G4709 Other insomnia: Secondary | ICD-10-CM | POA: Diagnosis not present

## 2019-11-09 DIAGNOSIS — F419 Anxiety disorder, unspecified: Secondary | ICD-10-CM | POA: Diagnosis not present

## 2019-11-09 DIAGNOSIS — F319 Bipolar disorder, unspecified: Secondary | ICD-10-CM | POA: Diagnosis not present

## 2019-11-09 DIAGNOSIS — F0150 Vascular dementia without behavioral disturbance: Secondary | ICD-10-CM | POA: Diagnosis not present

## 2019-11-21 DIAGNOSIS — K5900 Constipation, unspecified: Secondary | ICD-10-CM | POA: Diagnosis not present

## 2019-11-21 DIAGNOSIS — E785 Hyperlipidemia, unspecified: Secondary | ICD-10-CM | POA: Diagnosis not present

## 2019-11-21 DIAGNOSIS — I10 Essential (primary) hypertension: Secondary | ICD-10-CM | POA: Diagnosis not present

## 2019-11-21 DIAGNOSIS — G4700 Insomnia, unspecified: Secondary | ICD-10-CM | POA: Diagnosis not present

## 2019-11-21 DIAGNOSIS — E1165 Type 2 diabetes mellitus with hyperglycemia: Secondary | ICD-10-CM | POA: Diagnosis not present

## 2019-11-22 DIAGNOSIS — I6930 Unspecified sequelae of cerebral infarction: Secondary | ICD-10-CM | POA: Diagnosis not present

## 2019-11-22 DIAGNOSIS — Z741 Need for assistance with personal care: Secondary | ICD-10-CM | POA: Diagnosis not present

## 2019-11-23 DIAGNOSIS — Z741 Need for assistance with personal care: Secondary | ICD-10-CM | POA: Diagnosis not present

## 2019-11-23 DIAGNOSIS — I10 Essential (primary) hypertension: Secondary | ICD-10-CM | POA: Diagnosis not present

## 2019-11-23 DIAGNOSIS — R2681 Unsteadiness on feet: Secondary | ICD-10-CM | POA: Diagnosis not present

## 2019-11-23 DIAGNOSIS — E1165 Type 2 diabetes mellitus with hyperglycemia: Secondary | ICD-10-CM | POA: Diagnosis not present

## 2019-11-23 DIAGNOSIS — I6930 Unspecified sequelae of cerebral infarction: Secondary | ICD-10-CM | POA: Diagnosis not present

## 2019-11-23 DIAGNOSIS — G9341 Metabolic encephalopathy: Secondary | ICD-10-CM | POA: Diagnosis not present

## 2019-11-24 DIAGNOSIS — I6930 Unspecified sequelae of cerebral infarction: Secondary | ICD-10-CM | POA: Diagnosis not present

## 2019-11-24 DIAGNOSIS — Z741 Need for assistance with personal care: Secondary | ICD-10-CM | POA: Diagnosis not present

## 2019-11-26 DIAGNOSIS — Z741 Need for assistance with personal care: Secondary | ICD-10-CM | POA: Diagnosis not present

## 2019-11-26 DIAGNOSIS — I6930 Unspecified sequelae of cerebral infarction: Secondary | ICD-10-CM | POA: Diagnosis not present

## 2019-11-27 DIAGNOSIS — I10 Essential (primary) hypertension: Secondary | ICD-10-CM | POA: Diagnosis not present

## 2019-11-27 DIAGNOSIS — F329 Major depressive disorder, single episode, unspecified: Secondary | ICD-10-CM | POA: Diagnosis not present

## 2019-11-27 DIAGNOSIS — F330 Major depressive disorder, recurrent, mild: Secondary | ICD-10-CM | POA: Diagnosis not present

## 2019-11-27 DIAGNOSIS — I6930 Unspecified sequelae of cerebral infarction: Secondary | ICD-10-CM | POA: Diagnosis not present

## 2019-11-27 DIAGNOSIS — E1165 Type 2 diabetes mellitus with hyperglycemia: Secondary | ICD-10-CM | POA: Diagnosis not present

## 2019-11-27 DIAGNOSIS — I639 Cerebral infarction, unspecified: Secondary | ICD-10-CM | POA: Diagnosis not present

## 2019-11-27 DIAGNOSIS — F0150 Vascular dementia without behavioral disturbance: Secondary | ICD-10-CM | POA: Diagnosis not present

## 2019-11-27 DIAGNOSIS — E785 Hyperlipidemia, unspecified: Secondary | ICD-10-CM | POA: Diagnosis not present

## 2019-11-27 DIAGNOSIS — E1140 Type 2 diabetes mellitus with diabetic neuropathy, unspecified: Secondary | ICD-10-CM | POA: Diagnosis not present

## 2019-11-27 DIAGNOSIS — Z741 Need for assistance with personal care: Secondary | ICD-10-CM | POA: Diagnosis not present

## 2019-11-28 DIAGNOSIS — M25551 Pain in right hip: Secondary | ICD-10-CM | POA: Diagnosis not present

## 2019-11-29 DIAGNOSIS — Z741 Need for assistance with personal care: Secondary | ICD-10-CM | POA: Diagnosis not present

## 2019-11-29 DIAGNOSIS — I6930 Unspecified sequelae of cerebral infarction: Secondary | ICD-10-CM | POA: Diagnosis not present

## 2019-11-30 DIAGNOSIS — I6930 Unspecified sequelae of cerebral infarction: Secondary | ICD-10-CM | POA: Diagnosis not present

## 2019-11-30 DIAGNOSIS — Z741 Need for assistance with personal care: Secondary | ICD-10-CM | POA: Diagnosis not present

## 2019-12-01 DIAGNOSIS — I6930 Unspecified sequelae of cerebral infarction: Secondary | ICD-10-CM | POA: Diagnosis not present

## 2019-12-01 DIAGNOSIS — Z741 Need for assistance with personal care: Secondary | ICD-10-CM | POA: Diagnosis not present

## 2019-12-04 DIAGNOSIS — I6930 Unspecified sequelae of cerebral infarction: Secondary | ICD-10-CM | POA: Diagnosis not present

## 2019-12-04 DIAGNOSIS — Z741 Need for assistance with personal care: Secondary | ICD-10-CM | POA: Diagnosis not present

## 2019-12-05 DIAGNOSIS — Z741 Need for assistance with personal care: Secondary | ICD-10-CM | POA: Diagnosis not present

## 2019-12-05 DIAGNOSIS — I6930 Unspecified sequelae of cerebral infarction: Secondary | ICD-10-CM | POA: Diagnosis not present

## 2019-12-06 DIAGNOSIS — Z741 Need for assistance with personal care: Secondary | ICD-10-CM | POA: Diagnosis not present

## 2019-12-06 DIAGNOSIS — I6930 Unspecified sequelae of cerebral infarction: Secondary | ICD-10-CM | POA: Diagnosis not present

## 2019-12-07 DIAGNOSIS — F319 Bipolar disorder, unspecified: Secondary | ICD-10-CM | POA: Diagnosis not present

## 2019-12-07 DIAGNOSIS — Z741 Need for assistance with personal care: Secondary | ICD-10-CM | POA: Diagnosis not present

## 2019-12-07 DIAGNOSIS — F0150 Vascular dementia without behavioral disturbance: Secondary | ICD-10-CM | POA: Diagnosis not present

## 2019-12-07 DIAGNOSIS — F419 Anxiety disorder, unspecified: Secondary | ICD-10-CM | POA: Diagnosis not present

## 2019-12-07 DIAGNOSIS — F329 Major depressive disorder, single episode, unspecified: Secondary | ICD-10-CM | POA: Diagnosis not present

## 2019-12-07 DIAGNOSIS — I6930 Unspecified sequelae of cerebral infarction: Secondary | ICD-10-CM | POA: Diagnosis not present

## 2019-12-08 DIAGNOSIS — Z741 Need for assistance with personal care: Secondary | ICD-10-CM | POA: Diagnosis not present

## 2019-12-08 DIAGNOSIS — I6930 Unspecified sequelae of cerebral infarction: Secondary | ICD-10-CM | POA: Diagnosis not present

## 2019-12-10 DIAGNOSIS — Z741 Need for assistance with personal care: Secondary | ICD-10-CM | POA: Diagnosis not present

## 2019-12-10 DIAGNOSIS — I6930 Unspecified sequelae of cerebral infarction: Secondary | ICD-10-CM | POA: Diagnosis not present

## 2019-12-11 DIAGNOSIS — I6930 Unspecified sequelae of cerebral infarction: Secondary | ICD-10-CM | POA: Diagnosis not present

## 2019-12-11 DIAGNOSIS — M6281 Muscle weakness (generalized): Secondary | ICD-10-CM | POA: Diagnosis not present

## 2019-12-11 DIAGNOSIS — N390 Urinary tract infection, site not specified: Secondary | ICD-10-CM | POA: Diagnosis not present

## 2019-12-11 DIAGNOSIS — Z741 Need for assistance with personal care: Secondary | ICD-10-CM | POA: Diagnosis not present

## 2019-12-12 DIAGNOSIS — Z741 Need for assistance with personal care: Secondary | ICD-10-CM | POA: Diagnosis not present

## 2019-12-12 DIAGNOSIS — I6930 Unspecified sequelae of cerebral infarction: Secondary | ICD-10-CM | POA: Diagnosis not present

## 2019-12-13 DIAGNOSIS — B351 Tinea unguium: Secondary | ICD-10-CM | POA: Diagnosis not present

## 2019-12-13 DIAGNOSIS — E1159 Type 2 diabetes mellitus with other circulatory complications: Secondary | ICD-10-CM | POA: Diagnosis not present

## 2019-12-13 DIAGNOSIS — Z741 Need for assistance with personal care: Secondary | ICD-10-CM | POA: Diagnosis not present

## 2019-12-13 DIAGNOSIS — I6930 Unspecified sequelae of cerebral infarction: Secondary | ICD-10-CM | POA: Diagnosis not present

## 2019-12-14 DIAGNOSIS — I6930 Unspecified sequelae of cerebral infarction: Secondary | ICD-10-CM | POA: Diagnosis not present

## 2019-12-14 DIAGNOSIS — Z741 Need for assistance with personal care: Secondary | ICD-10-CM | POA: Diagnosis not present

## 2019-12-16 IMAGING — MR MR HEAD W/O CM
9 of 10 series · 34 of 48 positions shown · non-contrast
Comparison: Prior MRI from 07/09/2017.

CLINICAL DATA: Initial evaluation for worsening right-sided
weakness with slurred speech, recent stroke.

EXAM:
MRI HEAD WITHOUT CONTRAST
TECHNIQUE: Multiplanar, multiecho pulse sequences of the brain and surrounding
structures were obtained without intravenous contrast.

[Series 3: DWI · axial · 3.0mm · 0.94mm/px · z∈[-87,+59]mm · 9 of 100 slices shown (1 of 2)]
[im 1/100]
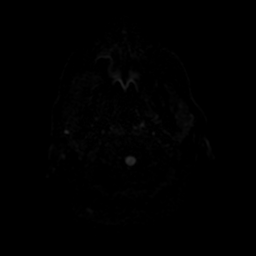
[im 13/100]
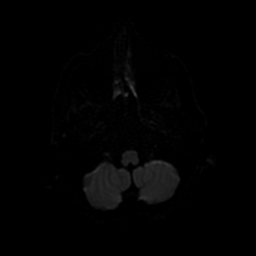
[im 25/100]
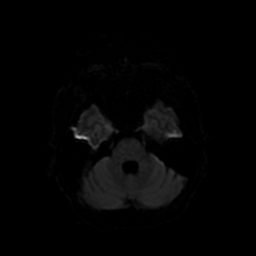
[im 38/100]
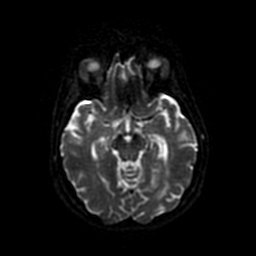
[im 50/100]
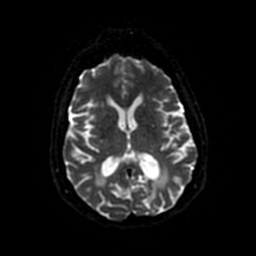
[im 62/100]
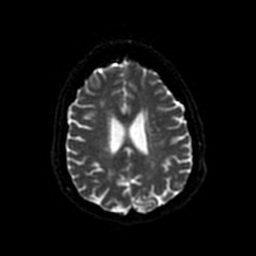
[im 75/100]
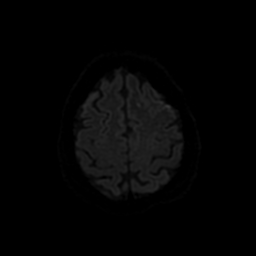
[im 87/100]
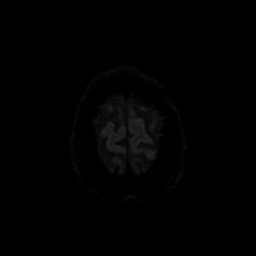
[im 100/100]
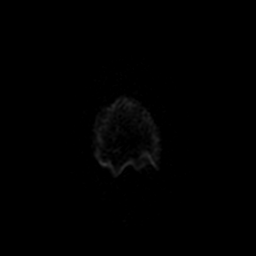

[Series 4: DWI · coronal · 4.0mm · 0.94mm/px · 7 of 72 slices shown (2 of 2)]
[im 1/72]
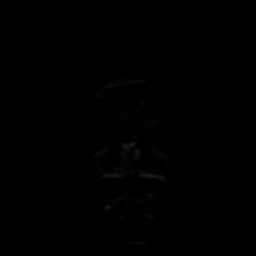
[im 12/72]
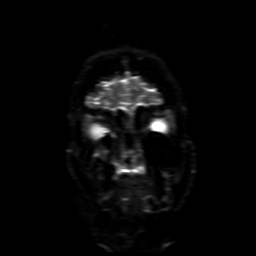
[im 24/72]
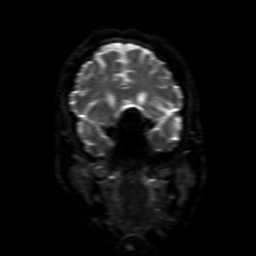
[im 36/72]
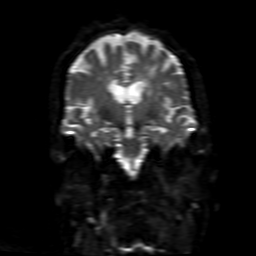
[im 48/72]
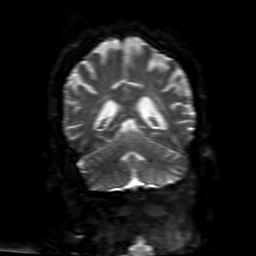
[im 60/72]
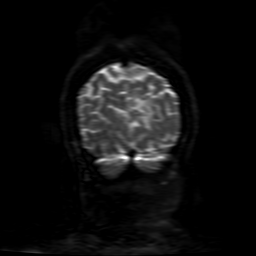
[im 72/72]
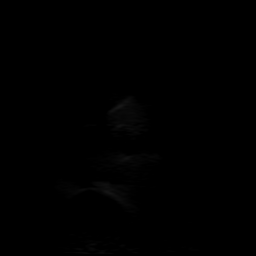

[Series 5: FLAIR · sagittal · 5.0mm · 0.47mm/px · 2 of 23 slices shown (1 of 2)]
[im 1/23]
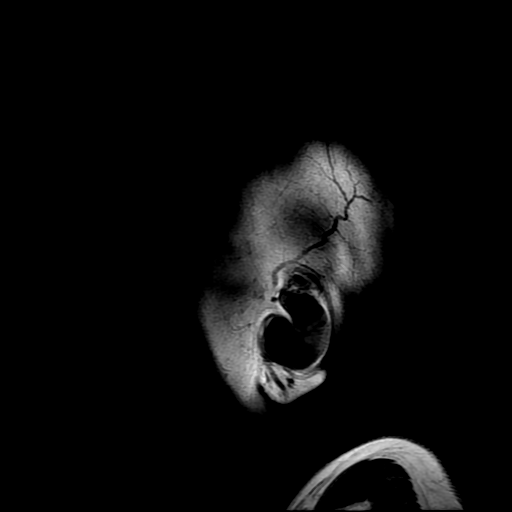
[im 23/23]
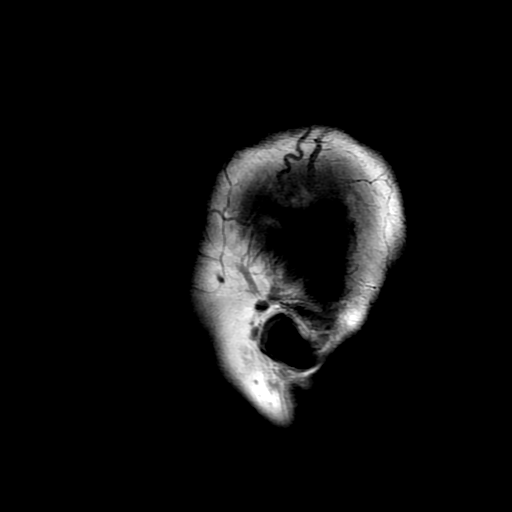

[Series 6: T2 · axial · 5.0mm · 0.43mm/px · z∈[-79,+69]mm · 2 of 26 slices shown (1 of 2)]
[im 1/26]
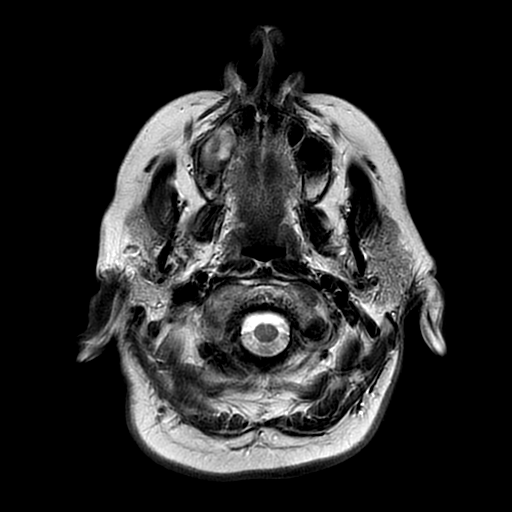
[im 26/26]
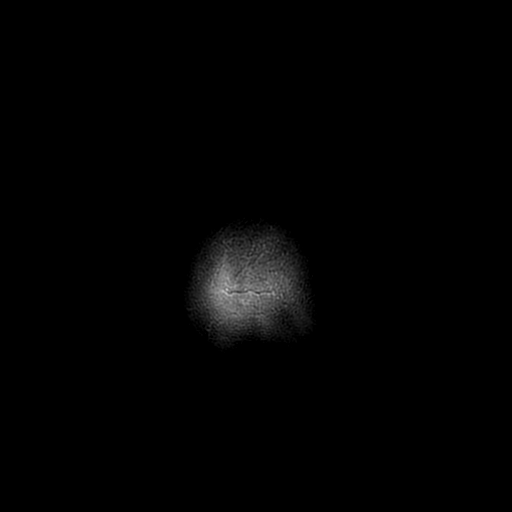

[Series 7: FLAIR · axial · 3.0mm · 0.43mm/px · z∈[-79,+69]mm · 2 of 26 slices shown (2 of 2)]
[im 1/26]
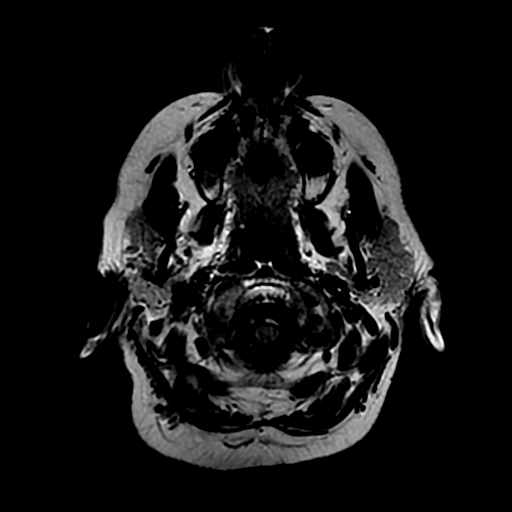
[im 26/26]
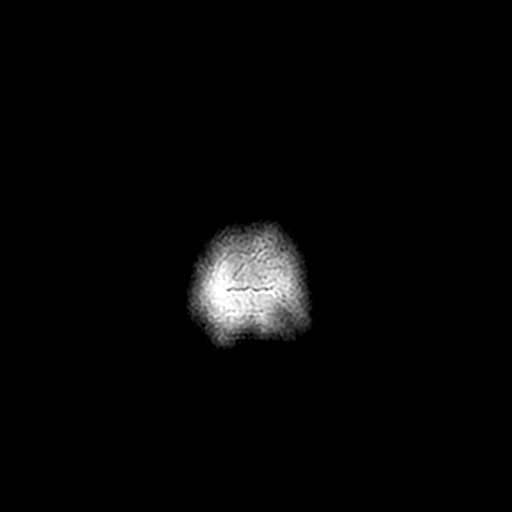

[Series 8: (person_name) · axial · 3.0mm · 0.47mm/px · 1 of 104 slices shown]
[im 1/104]
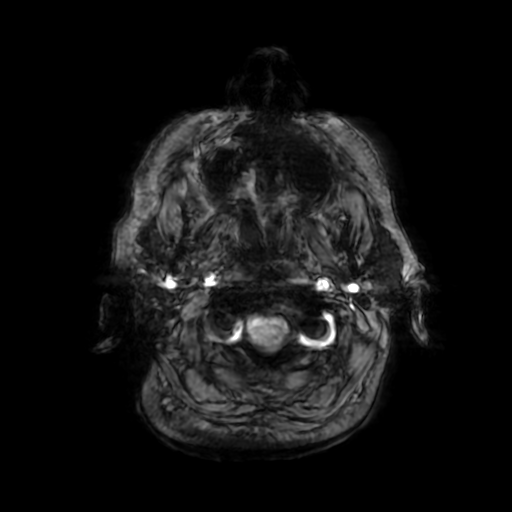

[Series 10: T2 · coronal · 5.0mm · 0.39mm/px · 3 of 28 slices shown (2 of 2)]
[im 1/28]
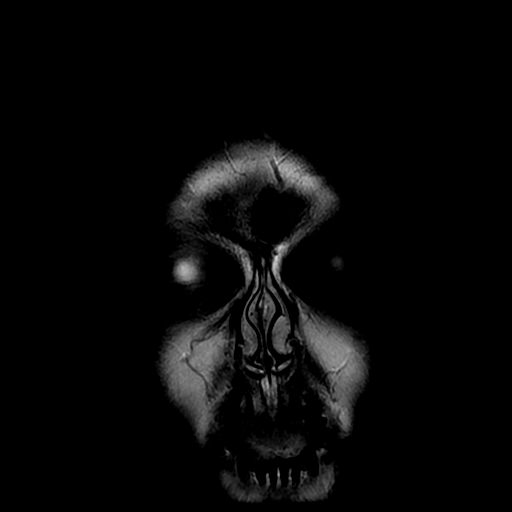
[im 14/28]
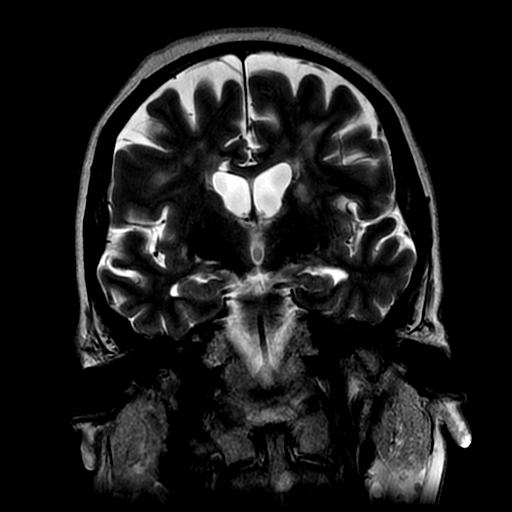
[im 28/28]
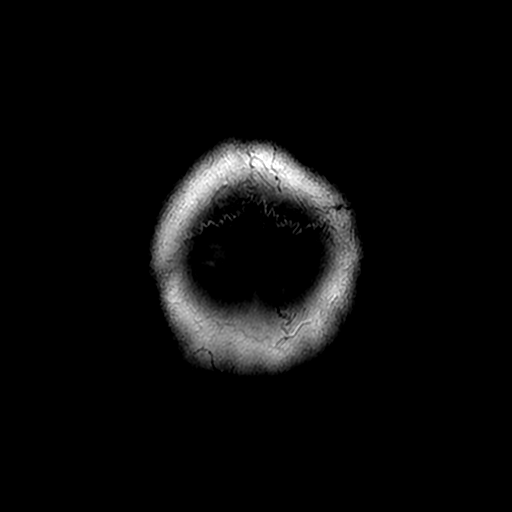

[Series 350: ADC · axial · 3.0mm · 0.94mm/px · z∈[-87,+59]mm · 5 of 50 slices shown (1 of 2)]
[im 1/50]
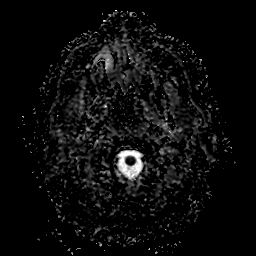
[im 13/50]
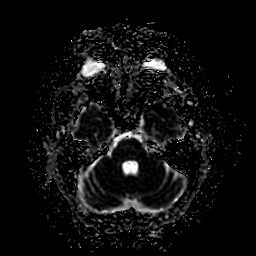
[im 25/50]
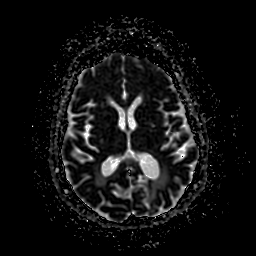
[im 37/50]
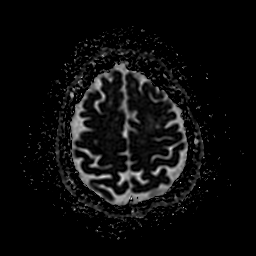
[im 50/50]
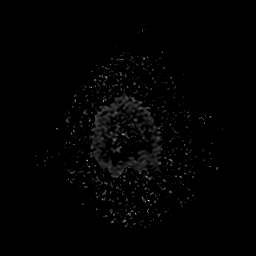

[Series 450: ADC · coronal · 4.0mm · 0.94mm/px · 3 of 36 slices shown (2 of 2)]
[im 1/36]
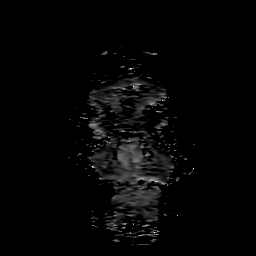
[im 18/36]
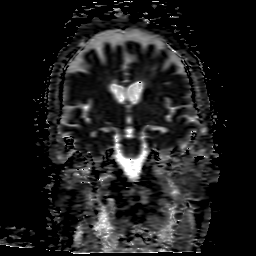
[im 36/36]
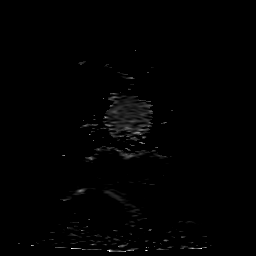

[34 of 48 positions shown; findings below may reference images not displayed]

FINDINGS: Brain: Age-related cerebral atrophy with chronic small vessel
ischemic disease again noted. Encephalomalacia with gliosis within
the parasagittal left parietal lobe consistent with remote ischemic
infarct. Small remote lacunar infarcts present within the bilateral
basal ganglia/corona radiata as well as the left thalamus. Chronic
hemorrhagic blood products present about several of these infarcts.

Continued interval evolution of recently identified small ischemic
infarcts involving the left periventricular white matter, relatively
stable in size and distribution as compared to previous. No evidence
for hemorrhagic transformation or significant mass effect.

There are several small areas of new acute infarction involving the
cortical gray matter and underlying periventricular white matter of
the parasagittal right frontal lobe (series 3, image 34, 32).
Largest area of infarct measures 12 mm. No associated hemorrhage or
mass effect. No other areas of new or interval infarction.
Gray-white matter differentiation otherwise maintained.

No mass lesion, midline shift or mass effect. No hydrocephalus. No
extra-axial fluid collection. Major dural sinuses are grossly
patent.

Pituitary gland suprasellar region normal. Midline structures
intact.

Vascular: Major intracranial vascular flow voids are maintained at
the skull base.

Skull and upper cervical spine: Craniocervical junction normal.
Upper cervical spine normal. Bone marrow signal intensity within
normal limits. Hyperostosis frontalis interna noted. No scalp soft
tissue abnormality.

Sinuses/Orbits: Globes and orbital soft tissues within normal
limits. Patient status post lens extraction bilaterally. Mild
scattered mucosal thickening within the ethmoidal air cells and
maxillary sinuses. No air-fluid level to suggest acute sinusitis. No
mastoid effusion. Inner ear structures normal.

Other: None.
IMPRESSION: 1. Few small volume new acute ischemic infarcts involving the
parasagittal cortical and subcortical/periventricular left frontal
lobe, new relative to recent MRI from 07/09/2017. No associated
hemorrhage or mass effect.
2. Normal expected interval evolution of recently identified small
volume left-sided infarcts, relatively stable in size and
distribution as compared to previous.
3. Otherwise stable appearance of the brain with chronic atrophy and
small vessel ischemic disease, with multiple scattered remote
infarcts as above.

## 2019-12-18 DIAGNOSIS — Z741 Need for assistance with personal care: Secondary | ICD-10-CM | POA: Diagnosis not present

## 2019-12-18 DIAGNOSIS — I6930 Unspecified sequelae of cerebral infarction: Secondary | ICD-10-CM | POA: Diagnosis not present

## 2019-12-19 DIAGNOSIS — Z741 Need for assistance with personal care: Secondary | ICD-10-CM | POA: Diagnosis not present

## 2019-12-19 DIAGNOSIS — E1165 Type 2 diabetes mellitus with hyperglycemia: Secondary | ICD-10-CM | POA: Diagnosis not present

## 2019-12-19 DIAGNOSIS — I6930 Unspecified sequelae of cerebral infarction: Secondary | ICD-10-CM | POA: Diagnosis not present

## 2019-12-19 DIAGNOSIS — E550 Rickets, active: Secondary | ICD-10-CM | POA: Diagnosis not present

## 2019-12-21 DIAGNOSIS — F319 Bipolar disorder, unspecified: Secondary | ICD-10-CM | POA: Diagnosis not present

## 2019-12-21 DIAGNOSIS — F419 Anxiety disorder, unspecified: Secondary | ICD-10-CM | POA: Diagnosis not present

## 2019-12-21 DIAGNOSIS — F0150 Vascular dementia without behavioral disturbance: Secondary | ICD-10-CM | POA: Diagnosis not present

## 2019-12-23 DIAGNOSIS — F0150 Vascular dementia without behavioral disturbance: Secondary | ICD-10-CM | POA: Diagnosis not present

## 2019-12-23 DIAGNOSIS — E1140 Type 2 diabetes mellitus with diabetic neuropathy, unspecified: Secondary | ICD-10-CM | POA: Diagnosis not present

## 2019-12-23 DIAGNOSIS — I639 Cerebral infarction, unspecified: Secondary | ICD-10-CM | POA: Diagnosis not present

## 2019-12-23 DIAGNOSIS — F330 Major depressive disorder, recurrent, mild: Secondary | ICD-10-CM | POA: Diagnosis not present

## 2019-12-23 DIAGNOSIS — E785 Hyperlipidemia, unspecified: Secondary | ICD-10-CM | POA: Diagnosis not present

## 2019-12-23 DIAGNOSIS — I10 Essential (primary) hypertension: Secondary | ICD-10-CM | POA: Diagnosis not present

## 2019-12-23 DIAGNOSIS — E1165 Type 2 diabetes mellitus with hyperglycemia: Secondary | ICD-10-CM | POA: Diagnosis not present

## 2019-12-23 DIAGNOSIS — F329 Major depressive disorder, single episode, unspecified: Secondary | ICD-10-CM | POA: Diagnosis not present

## 2019-12-28 DIAGNOSIS — N390 Urinary tract infection, site not specified: Secondary | ICD-10-CM | POA: Diagnosis not present

## 2019-12-28 DIAGNOSIS — R109 Unspecified abdominal pain: Secondary | ICD-10-CM | POA: Diagnosis not present

## 2019-12-30 DIAGNOSIS — N390 Urinary tract infection, site not specified: Secondary | ICD-10-CM | POA: Diagnosis not present

## 2020-01-04 DIAGNOSIS — R5381 Other malaise: Secondary | ICD-10-CM | POA: Diagnosis not present

## 2020-01-04 DIAGNOSIS — F329 Major depressive disorder, single episode, unspecified: Secondary | ICD-10-CM | POA: Diagnosis not present

## 2020-01-04 DIAGNOSIS — E1165 Type 2 diabetes mellitus with hyperglycemia: Secondary | ICD-10-CM | POA: Diagnosis not present

## 2020-01-04 DIAGNOSIS — I10 Essential (primary) hypertension: Secondary | ICD-10-CM | POA: Diagnosis not present

## 2020-01-16 DIAGNOSIS — E1165 Type 2 diabetes mellitus with hyperglycemia: Secondary | ICD-10-CM | POA: Diagnosis not present

## 2020-01-16 DIAGNOSIS — K5900 Constipation, unspecified: Secondary | ICD-10-CM | POA: Diagnosis not present

## 2020-01-16 DIAGNOSIS — I10 Essential (primary) hypertension: Secondary | ICD-10-CM | POA: Diagnosis not present

## 2020-01-16 DIAGNOSIS — G4700 Insomnia, unspecified: Secondary | ICD-10-CM | POA: Diagnosis not present

## 2020-01-18 DIAGNOSIS — M6281 Muscle weakness (generalized): Secondary | ICD-10-CM | POA: Diagnosis not present

## 2020-01-18 DIAGNOSIS — Z5181 Encounter for therapeutic drug level monitoring: Secondary | ICD-10-CM | POA: Diagnosis not present

## 2020-01-18 DIAGNOSIS — E119 Type 2 diabetes mellitus without complications: Secondary | ICD-10-CM | POA: Diagnosis not present

## 2020-01-18 DIAGNOSIS — E550 Rickets, active: Secondary | ICD-10-CM | POA: Diagnosis not present

## 2020-01-19 DIAGNOSIS — E1165 Type 2 diabetes mellitus with hyperglycemia: Secondary | ICD-10-CM | POA: Diagnosis not present

## 2020-01-19 DIAGNOSIS — I10 Essential (primary) hypertension: Secondary | ICD-10-CM | POA: Diagnosis not present

## 2020-01-19 DIAGNOSIS — F0150 Vascular dementia without behavioral disturbance: Secondary | ICD-10-CM | POA: Diagnosis not present

## 2020-01-19 DIAGNOSIS — E1140 Type 2 diabetes mellitus with diabetic neuropathy, unspecified: Secondary | ICD-10-CM | POA: Diagnosis not present

## 2020-01-19 DIAGNOSIS — E785 Hyperlipidemia, unspecified: Secondary | ICD-10-CM | POA: Diagnosis not present

## 2020-01-19 DIAGNOSIS — F329 Major depressive disorder, single episode, unspecified: Secondary | ICD-10-CM | POA: Diagnosis not present

## 2020-01-19 DIAGNOSIS — F330 Major depressive disorder, recurrent, mild: Secondary | ICD-10-CM | POA: Diagnosis not present

## 2020-01-19 DIAGNOSIS — I639 Cerebral infarction, unspecified: Secondary | ICD-10-CM | POA: Diagnosis not present

## 2020-01-22 DIAGNOSIS — G4700 Insomnia, unspecified: Secondary | ICD-10-CM | POA: Diagnosis not present

## 2020-01-22 DIAGNOSIS — F319 Bipolar disorder, unspecified: Secondary | ICD-10-CM | POA: Diagnosis not present

## 2020-01-22 DIAGNOSIS — F419 Anxiety disorder, unspecified: Secondary | ICD-10-CM | POA: Diagnosis not present

## 2020-01-22 DIAGNOSIS — F0150 Vascular dementia without behavioral disturbance: Secondary | ICD-10-CM | POA: Diagnosis not present

## 2020-01-31 DIAGNOSIS — Z741 Need for assistance with personal care: Secondary | ICD-10-CM | POA: Diagnosis not present

## 2020-01-31 DIAGNOSIS — I6930 Unspecified sequelae of cerebral infarction: Secondary | ICD-10-CM | POA: Diagnosis not present

## 2020-02-01 DIAGNOSIS — Z741 Need for assistance with personal care: Secondary | ICD-10-CM | POA: Diagnosis not present

## 2020-02-01 DIAGNOSIS — I6930 Unspecified sequelae of cerebral infarction: Secondary | ICD-10-CM | POA: Diagnosis not present

## 2020-02-04 DIAGNOSIS — Z741 Need for assistance with personal care: Secondary | ICD-10-CM | POA: Diagnosis not present

## 2020-02-04 DIAGNOSIS — I6930 Unspecified sequelae of cerebral infarction: Secondary | ICD-10-CM | POA: Diagnosis not present

## 2020-02-05 DIAGNOSIS — Z741 Need for assistance with personal care: Secondary | ICD-10-CM | POA: Diagnosis not present

## 2020-02-05 DIAGNOSIS — I6930 Unspecified sequelae of cerebral infarction: Secondary | ICD-10-CM | POA: Diagnosis not present

## 2020-02-06 DIAGNOSIS — Z741 Need for assistance with personal care: Secondary | ICD-10-CM | POA: Diagnosis not present

## 2020-02-06 DIAGNOSIS — I6930 Unspecified sequelae of cerebral infarction: Secondary | ICD-10-CM | POA: Diagnosis not present

## 2020-02-07 DIAGNOSIS — I6930 Unspecified sequelae of cerebral infarction: Secondary | ICD-10-CM | POA: Diagnosis not present

## 2020-02-07 DIAGNOSIS — Z741 Need for assistance with personal care: Secondary | ICD-10-CM | POA: Diagnosis not present

## 2020-02-08 DIAGNOSIS — Z741 Need for assistance with personal care: Secondary | ICD-10-CM | POA: Diagnosis not present

## 2020-02-08 DIAGNOSIS — I6930 Unspecified sequelae of cerebral infarction: Secondary | ICD-10-CM | POA: Diagnosis not present

## 2020-02-12 DIAGNOSIS — Z741 Need for assistance with personal care: Secondary | ICD-10-CM | POA: Diagnosis not present

## 2020-02-12 DIAGNOSIS — I6930 Unspecified sequelae of cerebral infarction: Secondary | ICD-10-CM | POA: Diagnosis not present

## 2020-02-13 ENCOUNTER — Telehealth: Payer: Self-pay | Admitting: General Practice

## 2020-02-13 DIAGNOSIS — I10 Essential (primary) hypertension: Secondary | ICD-10-CM | POA: Diagnosis not present

## 2020-02-13 DIAGNOSIS — E1165 Type 2 diabetes mellitus with hyperglycemia: Secondary | ICD-10-CM | POA: Diagnosis not present

## 2020-02-13 DIAGNOSIS — I6930 Unspecified sequelae of cerebral infarction: Secondary | ICD-10-CM | POA: Diagnosis not present

## 2020-02-13 DIAGNOSIS — D649 Anemia, unspecified: Secondary | ICD-10-CM | POA: Diagnosis not present

## 2020-02-13 DIAGNOSIS — K5900 Constipation, unspecified: Secondary | ICD-10-CM | POA: Diagnosis not present

## 2020-02-13 DIAGNOSIS — Z741 Need for assistance with personal care: Secondary | ICD-10-CM | POA: Diagnosis not present

## 2020-02-13 NOTE — Telephone Encounter (Signed)
Encounter not needed

## 2020-02-14 ENCOUNTER — Telehealth: Payer: Self-pay

## 2020-02-14 DIAGNOSIS — Z741 Need for assistance with personal care: Secondary | ICD-10-CM | POA: Diagnosis not present

## 2020-02-14 DIAGNOSIS — I6930 Unspecified sequelae of cerebral infarction: Secondary | ICD-10-CM | POA: Diagnosis not present

## 2020-02-14 DIAGNOSIS — R1312 Dysphagia, oropharyngeal phase: Secondary | ICD-10-CM | POA: Diagnosis not present

## 2020-02-14 DIAGNOSIS — R41841 Cognitive communication deficit: Secondary | ICD-10-CM | POA: Diagnosis not present

## 2020-02-14 NOTE — Telephone Encounter (Signed)
The pt daughter states the pt is in a nursing home. She was unaware that we was not getting transmissions  From the patient monitor. She is going to call the nursing home and have them send a transmission.

## 2020-02-15 DIAGNOSIS — Z741 Need for assistance with personal care: Secondary | ICD-10-CM | POA: Diagnosis not present

## 2020-02-15 DIAGNOSIS — R1312 Dysphagia, oropharyngeal phase: Secondary | ICD-10-CM | POA: Diagnosis not present

## 2020-02-15 DIAGNOSIS — R41841 Cognitive communication deficit: Secondary | ICD-10-CM | POA: Diagnosis not present

## 2020-02-15 DIAGNOSIS — I6930 Unspecified sequelae of cerebral infarction: Secondary | ICD-10-CM | POA: Diagnosis not present

## 2020-02-16 DIAGNOSIS — N3001 Acute cystitis with hematuria: Secondary | ICD-10-CM | POA: Diagnosis not present

## 2020-02-16 DIAGNOSIS — I6930 Unspecified sequelae of cerebral infarction: Secondary | ICD-10-CM | POA: Diagnosis not present

## 2020-02-16 DIAGNOSIS — Z741 Need for assistance with personal care: Secondary | ICD-10-CM | POA: Diagnosis not present

## 2020-02-16 DIAGNOSIS — R1312 Dysphagia, oropharyngeal phase: Secondary | ICD-10-CM | POA: Diagnosis not present

## 2020-02-16 DIAGNOSIS — R41841 Cognitive communication deficit: Secondary | ICD-10-CM | POA: Diagnosis not present

## 2020-02-16 DIAGNOSIS — N390 Urinary tract infection, site not specified: Secondary | ICD-10-CM | POA: Diagnosis not present

## 2020-02-19 DIAGNOSIS — F329 Major depressive disorder, single episode, unspecified: Secondary | ICD-10-CM | POA: Diagnosis not present

## 2020-02-19 DIAGNOSIS — R1312 Dysphagia, oropharyngeal phase: Secondary | ICD-10-CM | POA: Diagnosis not present

## 2020-02-19 DIAGNOSIS — I10 Essential (primary) hypertension: Secondary | ICD-10-CM | POA: Diagnosis not present

## 2020-02-19 DIAGNOSIS — I639 Cerebral infarction, unspecified: Secondary | ICD-10-CM | POA: Diagnosis not present

## 2020-02-19 DIAGNOSIS — E1165 Type 2 diabetes mellitus with hyperglycemia: Secondary | ICD-10-CM | POA: Diagnosis not present

## 2020-02-19 DIAGNOSIS — I6930 Unspecified sequelae of cerebral infarction: Secondary | ICD-10-CM | POA: Diagnosis not present

## 2020-02-19 DIAGNOSIS — E1140 Type 2 diabetes mellitus with diabetic neuropathy, unspecified: Secondary | ICD-10-CM | POA: Diagnosis not present

## 2020-02-19 DIAGNOSIS — R41841 Cognitive communication deficit: Secondary | ICD-10-CM | POA: Diagnosis not present

## 2020-02-19 DIAGNOSIS — F0150 Vascular dementia without behavioral disturbance: Secondary | ICD-10-CM | POA: Diagnosis not present

## 2020-02-19 DIAGNOSIS — E785 Hyperlipidemia, unspecified: Secondary | ICD-10-CM | POA: Diagnosis not present

## 2020-02-19 DIAGNOSIS — Z741 Need for assistance with personal care: Secondary | ICD-10-CM | POA: Diagnosis not present

## 2020-02-19 DIAGNOSIS — F330 Major depressive disorder, recurrent, mild: Secondary | ICD-10-CM | POA: Diagnosis not present

## 2020-02-20 DIAGNOSIS — R1312 Dysphagia, oropharyngeal phase: Secondary | ICD-10-CM | POA: Diagnosis not present

## 2020-02-20 DIAGNOSIS — Z741 Need for assistance with personal care: Secondary | ICD-10-CM | POA: Diagnosis not present

## 2020-02-20 DIAGNOSIS — R41841 Cognitive communication deficit: Secondary | ICD-10-CM | POA: Diagnosis not present

## 2020-02-20 DIAGNOSIS — I6930 Unspecified sequelae of cerebral infarction: Secondary | ICD-10-CM | POA: Diagnosis not present

## 2020-02-21 DIAGNOSIS — R41841 Cognitive communication deficit: Secondary | ICD-10-CM | POA: Diagnosis not present

## 2020-02-21 DIAGNOSIS — R1312 Dysphagia, oropharyngeal phase: Secondary | ICD-10-CM | POA: Diagnosis not present

## 2020-02-21 DIAGNOSIS — I6930 Unspecified sequelae of cerebral infarction: Secondary | ICD-10-CM | POA: Diagnosis not present

## 2020-02-21 DIAGNOSIS — Z741 Need for assistance with personal care: Secondary | ICD-10-CM | POA: Diagnosis not present

## 2020-02-22 DIAGNOSIS — I6930 Unspecified sequelae of cerebral infarction: Secondary | ICD-10-CM | POA: Diagnosis not present

## 2020-02-22 DIAGNOSIS — R1312 Dysphagia, oropharyngeal phase: Secondary | ICD-10-CM | POA: Diagnosis not present

## 2020-02-22 DIAGNOSIS — R41841 Cognitive communication deficit: Secondary | ICD-10-CM | POA: Diagnosis not present

## 2020-02-22 DIAGNOSIS — Z741 Need for assistance with personal care: Secondary | ICD-10-CM | POA: Diagnosis not present

## 2020-02-23 DIAGNOSIS — Z741 Need for assistance with personal care: Secondary | ICD-10-CM | POA: Diagnosis not present

## 2020-02-23 DIAGNOSIS — R41841 Cognitive communication deficit: Secondary | ICD-10-CM | POA: Diagnosis not present

## 2020-02-23 DIAGNOSIS — R1312 Dysphagia, oropharyngeal phase: Secondary | ICD-10-CM | POA: Diagnosis not present

## 2020-02-23 DIAGNOSIS — I6930 Unspecified sequelae of cerebral infarction: Secondary | ICD-10-CM | POA: Diagnosis not present

## 2020-02-25 DIAGNOSIS — R41841 Cognitive communication deficit: Secondary | ICD-10-CM | POA: Diagnosis not present

## 2020-02-25 DIAGNOSIS — Z741 Need for assistance with personal care: Secondary | ICD-10-CM | POA: Diagnosis not present

## 2020-02-25 DIAGNOSIS — R1312 Dysphagia, oropharyngeal phase: Secondary | ICD-10-CM | POA: Diagnosis not present

## 2020-02-25 DIAGNOSIS — I6930 Unspecified sequelae of cerebral infarction: Secondary | ICD-10-CM | POA: Diagnosis not present

## 2020-02-26 DIAGNOSIS — I6930 Unspecified sequelae of cerebral infarction: Secondary | ICD-10-CM | POA: Diagnosis not present

## 2020-02-26 DIAGNOSIS — R41841 Cognitive communication deficit: Secondary | ICD-10-CM | POA: Diagnosis not present

## 2020-02-26 DIAGNOSIS — Z741 Need for assistance with personal care: Secondary | ICD-10-CM | POA: Diagnosis not present

## 2020-02-26 DIAGNOSIS — R1312 Dysphagia, oropharyngeal phase: Secondary | ICD-10-CM | POA: Diagnosis not present

## 2020-02-26 DIAGNOSIS — F3132 Bipolar disorder, current episode depressed, moderate: Secondary | ICD-10-CM | POA: Diagnosis not present

## 2020-02-26 DIAGNOSIS — F411 Generalized anxiety disorder: Secondary | ICD-10-CM | POA: Diagnosis not present

## 2020-02-27 DIAGNOSIS — R41841 Cognitive communication deficit: Secondary | ICD-10-CM | POA: Diagnosis not present

## 2020-02-27 DIAGNOSIS — I6930 Unspecified sequelae of cerebral infarction: Secondary | ICD-10-CM | POA: Diagnosis not present

## 2020-02-27 DIAGNOSIS — Z741 Need for assistance with personal care: Secondary | ICD-10-CM | POA: Diagnosis not present

## 2020-02-27 DIAGNOSIS — R1312 Dysphagia, oropharyngeal phase: Secondary | ICD-10-CM | POA: Diagnosis not present

## 2020-02-28 DIAGNOSIS — R1312 Dysphagia, oropharyngeal phase: Secondary | ICD-10-CM | POA: Diagnosis not present

## 2020-02-28 DIAGNOSIS — Z741 Need for assistance with personal care: Secondary | ICD-10-CM | POA: Diagnosis not present

## 2020-02-28 DIAGNOSIS — I6930 Unspecified sequelae of cerebral infarction: Secondary | ICD-10-CM | POA: Diagnosis not present

## 2020-02-28 DIAGNOSIS — R41841 Cognitive communication deficit: Secondary | ICD-10-CM | POA: Diagnosis not present

## 2020-03-01 DIAGNOSIS — Z741 Need for assistance with personal care: Secondary | ICD-10-CM | POA: Diagnosis not present

## 2020-03-01 DIAGNOSIS — R1312 Dysphagia, oropharyngeal phase: Secondary | ICD-10-CM | POA: Diagnosis not present

## 2020-03-01 DIAGNOSIS — I6930 Unspecified sequelae of cerebral infarction: Secondary | ICD-10-CM | POA: Diagnosis not present

## 2020-03-01 DIAGNOSIS — R41841 Cognitive communication deficit: Secondary | ICD-10-CM | POA: Diagnosis not present

## 2020-03-04 ENCOUNTER — Emergency Department (HOSPITAL_COMMUNITY): Payer: Medicare Other

## 2020-03-04 ENCOUNTER — Emergency Department (HOSPITAL_COMMUNITY)
Admission: EM | Admit: 2020-03-04 | Discharge: 2020-03-04 | Disposition: A | Discharge status: Home / Self Care | Payer: Medicare Other | Attending: Emergency Medicine | Admitting: Emergency Medicine

## 2020-03-04 ENCOUNTER — Encounter (HOSPITAL_COMMUNITY): Payer: Self-pay | Admitting: *Deleted

## 2020-03-04 ENCOUNTER — Other Ambulatory Visit: Payer: Self-pay

## 2020-03-04 DIAGNOSIS — R609 Edema, unspecified: Secondary | ICD-10-CM | POA: Diagnosis not present

## 2020-03-04 DIAGNOSIS — S022XXA Fracture of nasal bones, initial encounter for closed fracture: Secondary | ICD-10-CM | POA: Diagnosis not present

## 2020-03-04 DIAGNOSIS — Z741 Need for assistance with personal care: Secondary | ICD-10-CM | POA: Diagnosis not present

## 2020-03-04 DIAGNOSIS — M545 Low back pain, unspecified: Secondary | ICD-10-CM | POA: Diagnosis not present

## 2020-03-04 DIAGNOSIS — S0003XA Contusion of scalp, initial encounter: Secondary | ICD-10-CM | POA: Diagnosis not present

## 2020-03-04 DIAGNOSIS — Z794 Long term (current) use of insulin: Secondary | ICD-10-CM | POA: Insufficient documentation

## 2020-03-04 DIAGNOSIS — Y92129 Unspecified place in nursing home as the place of occurrence of the external cause: Secondary | ICD-10-CM | POA: Diagnosis not present

## 2020-03-04 DIAGNOSIS — E1142 Type 2 diabetes mellitus with diabetic polyneuropathy: Secondary | ICD-10-CM | POA: Insufficient documentation

## 2020-03-04 DIAGNOSIS — Z043 Encounter for examination and observation following other accident: Secondary | ICD-10-CM | POA: Diagnosis not present

## 2020-03-04 DIAGNOSIS — S0291XA Unspecified fracture of skull, initial encounter for closed fracture: Secondary | ICD-10-CM | POA: Diagnosis not present

## 2020-03-04 DIAGNOSIS — R58 Hemorrhage, not elsewhere classified: Secondary | ICD-10-CM | POA: Diagnosis not present

## 2020-03-04 DIAGNOSIS — W06XXXA Fall from bed, initial encounter: Secondary | ICD-10-CM | POA: Diagnosis not present

## 2020-03-04 DIAGNOSIS — E1140 Type 2 diabetes mellitus with diabetic neuropathy, unspecified: Secondary | ICD-10-CM | POA: Diagnosis not present

## 2020-03-04 DIAGNOSIS — E1165 Type 2 diabetes mellitus with hyperglycemia: Secondary | ICD-10-CM | POA: Diagnosis not present

## 2020-03-04 DIAGNOSIS — I10 Essential (primary) hypertension: Secondary | ICD-10-CM | POA: Insufficient documentation

## 2020-03-04 DIAGNOSIS — M25551 Pain in right hip: Secondary | ICD-10-CM | POA: Diagnosis not present

## 2020-03-04 DIAGNOSIS — Z7902 Long term (current) use of antithrombotics/antiplatelets: Secondary | ICD-10-CM | POA: Diagnosis not present

## 2020-03-04 DIAGNOSIS — W19XXXA Unspecified fall, initial encounter: Secondary | ICD-10-CM | POA: Diagnosis not present

## 2020-03-04 DIAGNOSIS — S0990XA Unspecified injury of head, initial encounter: Secondary | ICD-10-CM | POA: Diagnosis not present

## 2020-03-04 DIAGNOSIS — M542 Cervicalgia: Secondary | ICD-10-CM | POA: Insufficient documentation

## 2020-03-04 DIAGNOSIS — Z79899 Other long term (current) drug therapy: Secondary | ICD-10-CM | POA: Insufficient documentation

## 2020-03-04 DIAGNOSIS — S0031XA Abrasion of nose, initial encounter: Secondary | ICD-10-CM | POA: Diagnosis not present

## 2020-03-04 DIAGNOSIS — M79604 Pain in right leg: Secondary | ICD-10-CM | POA: Insufficient documentation

## 2020-03-04 DIAGNOSIS — Z91040 Latex allergy status: Secondary | ICD-10-CM | POA: Diagnosis not present

## 2020-03-04 DIAGNOSIS — I6930 Unspecified sequelae of cerebral infarction: Secondary | ICD-10-CM | POA: Diagnosis not present

## 2020-03-04 DIAGNOSIS — R1312 Dysphagia, oropharyngeal phase: Secondary | ICD-10-CM | POA: Diagnosis not present

## 2020-03-04 DIAGNOSIS — S8001XA Contusion of right knee, initial encounter: Secondary | ICD-10-CM | POA: Diagnosis not present

## 2020-03-04 DIAGNOSIS — R52 Pain, unspecified: Secondary | ICD-10-CM | POA: Diagnosis not present

## 2020-03-04 DIAGNOSIS — R41841 Cognitive communication deficit: Secondary | ICD-10-CM | POA: Diagnosis not present

## 2020-03-04 DIAGNOSIS — S0590XA Unspecified injury of unspecified eye and orbit, initial encounter: Secondary | ICD-10-CM | POA: Diagnosis not present

## 2020-03-04 NOTE — ED Provider Notes (Signed)
[facility] EMERGENCY DEPARTMENT Provider Note   CSN: [id number] Arrival date & time: 03/04/20  1353     History Chief Complaint  Patient presents with  . Fall    Kelly Khanamela R Romero is a 54 y.o. female with a history of diabetes mellitus, hypertension, hyperlipidemia, prior CVA, & right hemiparesis who is non-ambulatory at baseline who presents to the emergency department from [facility] care facility for evaluation status post fall.  Patient states that she fell out of bed and trying to get up.  She states she hit her head, she is unsure if she lost consciousness.  She is having pain to her head, face, lower back, as well as to the right lower extremity.  She also had some bleeding from her nose.  No alleviating or aggravating factors.  She denies chest pain, shortness of breath, abdominal pain, vomiting, or seizure activity.   I called and spoke with [facility] nurse for this patient, she was not there during fall, descriptively patient does not ambulate at baseline.  HPI     Past Medical History:  Diagnosis Date  . Diabetes mellitus without complication (HCC)   . Headache   . Hyperlipidemia   . Hypertension   . Stroke (HCC)   . TIA (transient ischemic attack)     Patient Active Problem List   Diagnosis Date Noted  . Hemiparesis affecting right side as late effect of cerebrovascular accident (HCC) 09/14/2017  . Extension of stroke (HCC) 08/06/2017  . Inadequate dietary intake 08/06/2017  . Hypoalbuminemia due to protein-calorie malnutrition (HCC)   . Orthostatic hypotension   . Poorly controlled type 2 diabetes mellitus with peripheral neuropathy (HCC)   . Emotional lability   . Thrombotic stroke (HCC) 07/13/2017  . Diabetes mellitus type 2 in obese (HCC)   . History of CVA (cerebrovascular accident)   . Benign essential HTN   . Late effect of cerebrovascular accident (CVA)   . Acute blood loss anemia   . Morbid obesity (HCC)   . Right hemiparesis (HCC)   .  Cryptogenic stroke (HCC) 07/08/2017  . Chest pain 11/06/2016  . Mid (multi infarct dementia), without behavioral disturbance (HCC) 04/10/2016  . Aphasia as late effect of stroke 04/10/2016  . Encephalopathy   . Cerebral thrombosis with cerebral infarction 12/29/2015  . Hyperglycemia   . Hyperlipidemia   . Hypertensive emergency   . Deep venous thrombosis (HCC)   . AKI (acute kidney injury) (HCC) 12/26/2015  . Altered mental status   . Stroke-like symptoms   . Acute encephalopathy 08/17/2015  . Anxiety and depression   . CVA (cerebrovascular accident due to intracerebral hemorrhage) (HCC) 08/15/2015  . Stroke (HCC)   . Encephalopathy, metabolic 12/28/2014  . 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 Essential hypertension 12/28/2014  . New onset headache 11/02/2014    Past Surgical History:  Procedure Laterality Date  . APPENDECTOMY    . CHOLECYSTECTOMY    . FOOT GANGLION EXCISION    . HERNIA REPAIR    . IR GASTROSTOMY TUBE MOD SED  07/30/2017  . IR GASTROSTOMY TUBE REMOVAL  07/15/2018  . IR GENERIC HISTORICAL  12/30/2015   IR ANGIO INTRA EXTRACRAN SEL COM CAROTID INNOMINATE BILAT MOD SED 12/30/2015 Julieanne CottonSanjeev Deveshwar, MD [facility]-INTER[facility] RAD  . IR GENERIC HISTORICAL  12/30/2015   IR ANGIO VERTEBRAL SEL VERTEBRAL BILAT MOD SED 12/30/2015 Julieanne CottonSanjeev Deveshwar, MD [facility]  . LOOP RECORDER INSERTION N/A 07/13/2017   Procedure: LOOP RECORDER  INSERTION;  Surgeon: Hillis Range, MD;  Location: [facility] INVASIVE CV LAB;  Service: Cardiovascular;  Laterality: N/A;  . TEE WITHOUT CARDIOVERSION N/A 04/19/2015   Procedure: TRANSESOPHAGEAL ECHOCARDIOGRAM (TEE);  Surgeon: Jake Bathe, MD;  Location: [facility] ENDOSCOPY;  Service: Cardiovascular;  Laterality: N/A;  . VAGINAL HYSTERECTOMY       OB History   No obstetric history on file.     Family History  Problem Relation Age of Onset  . Stroke Father        69s  . Heart attack Father 67  . COPD Mother   .  Heart failure Brother   . Cancer Maternal Grandmother        unknown   . COPD Other   . Heart failure Maternal Grandfather   . Hypertension Maternal Grandfather   . Cancer Paternal Grandfather        lung  . Diabetes Paternal Grandmother     Social History   Tobacco Use  . Smoking status: Never Smoker  . Smokeless tobacco: Never Used  Vaping Use  . Vaping Use: Never used  Substance Use Topics  . Alcohol use: No    Alcohol/week: 0.0 standard drinks    Comment: rare   . Drug use: No    Home Medications Prior to Admission medications   Medication Sig Start Date End Date Taking? Authorizing Provider  cephALEXin (KEFLEX) 500 MG capsule Take 1 capsule (500 mg total) by mouth 3 (three) times daily. 09/01/18   Shaune Pollack, MD  clopidogrel (PLAVIX) 75 MG tablet Take 1 tablet (75 mg total) by mouth daily. 08/06/17   Love, Evlyn Kanner, PA-C  divalproex (DEPAKOTE) 250 MG DR tablet Take 1 tablet (250 mg total) by mouth 2 (two) times daily. 08/06/17 08/30/19  Love, Evlyn Kanner, PA-C  escitalopram (LEXAPRO) 20 MG tablet Take 1 tablet (20 mg total) by mouth daily. 07/18/18   Everlena Cooper, Adam R, DO  insulin detemir (LEVEMIR) 100 UNIT/ML injection Inject 0.2 mLs (20 Units total) into the skin daily before breakfast. Patient taking differently: Inject 20 Units into the skin daily.  08/06/17   Love, Evlyn Kanner, PA-C  lisinopril (ZESTRIL) 5 MG tablet Take 5 mg by mouth daily.     [provider]  metoprolol tartrate (LOPRESSOR) 25 MG tablet Take 0.5 tablets (12.5 mg total) by mouth 2 (two) times daily. 08/06/17   Love, Evlyn Kanner, PA-C  polyethylene glycol [facility] Ethelene Hal) packet May take 17 g with 8 oz water per peg tube daily prn 08/10/17   Kirsteins, Victorino Sparrow, MD  rosuvastatin (CRESTOR) 40 MG tablet Take 1 tablet (40 mg total) by mouth at bedtime. 08/06/17   Love, Evlyn Kanner, PA-C    Allergies    Erythromycin and Latex  Review of Systems   Review of Systems  Constitutional: Negative for chills and  fever.  HENT: Positive for nosebleeds.        Positive for facial pain.  Respiratory: Negative for shortness of breath.   Cardiovascular: Negative for chest pain.  Gastrointestinal: Negative for abdominal pain.  Musculoskeletal: Positive for arthralgias, back pain and myalgias.  Neurological: Positive for headaches. Negative for seizures.  All other systems reviewed and are negative.   Physical Exam Updated Vital Signs BP 133/63   Pulse 73   Temp 98.3 F (36.8 C) (Oral)   Resp 15   Ht 5\' 5"  (1.651 m)   Wt 77.1 kg   SpO2 95%   BMI 28.29 kg/m   Physical Exam  Vitals and nursing note reviewed.  Constitutional:      General: She is not in acute distress. HENT:     Head:     Comments: Patient has a large left forehead/superior orbital area hematoma that is tender to palpation.    Ears:     Comments: No hemotympanum.    Nose:     Comments: Abrasion to the bridge of the nose, no significant deep lacerations.  Patient had some dried clotted blood within the left nare which was removed and some dried blood to the distal R nare.  No significant active bleeding.  No visible septal hematoma or septal deviation.  Her nasal bridge is tender to palpation    Mouth/Throat:     Comments: Some dried blood present on her teeth.  No intraoral injury noted.  No palpable dental instability.  Uvula is midline. Eyes:     Extraocular Movements: Extraocular movements intact.     Pupils: Pupils are equal, round, and reactive to light.     Comments: No conjunctival hemorrhage noted. Vision grossly intact.   Cardiovascular:     Rate and Rhythm: Normal rate and regular rhythm.     Comments: 2+ symmetric radial and DP pulses bilaterally. Pulmonary:     Effort: Pulmonary effort is normal.     Breath sounds: Normal breath sounds.  Chest:     Chest wall: No tenderness.  Abdominal:     General: There is no distension.     Palpations: Abdomen is soft.     Tenderness: There is no abdominal tenderness.  There is no guarding or rebound.  Musculoskeletal:     Cervical back: Neck supple. Tenderness (Mild diffuse.  No point/focal vertebral tenderness or palpable step-off.) present.     Comments: Upper extremities: Able to actively move at all joints, no significant pain with active or passive range of motion, no tenderness to palpation noted. Back: Patient diffusely tender throughout the midline and bilateral lumbar paraspinal muscles.  No point/focal vertebral tenderness or palpable step-off.  No thoracic, sacral, or coccygeal spine tenderness. Lower extremities: Patient able to actively lift bilateral lower extremities off of the bed and able to flex the bilateral knees somewhat, right knee flexion is mildly limited.  She is tender over the right anterior hip and knee mildly.  Otherwise nontender. Compartments are soft.   Neurological:     Mental Status: She is alert.     Comments: Alert.  Able to move all extremities some.    ED Results / Procedures / Treatments   Labs (all labs ordered are listed, but only abnormal results are displayed) Labs Reviewed - No data to display  EKG None  Radiology DG Lumbar Spine Complete  Result Date: 03/04/2020 CLINICAL DATA:  Larey Seat EXAM: LUMBAR SPINE - COMPLETE 4+ VIEW COMPARISON:  12/02/2004 FINDINGS: Frontal, bilateral oblique, lateral views of the lumbar spine are obtained. There are 5 non-rib-bearing lumbar type vertebral bodies with mild right convex scoliosis. Otherwise alignment is anatomic. No acute fractures. Mild facet hypertrophic changes at the lumbosacral junction. Disc spaces are well preserved. Extensive postsurgical changes from previous ventral hernia repair. IMPRESSION: 1. No acute lumbar spine fracture. Electronically Signed   By: Sharlet Salina M.D.   On: 03/04/2020 15:43   DG Pelvis 1-2 Views  Result Date: 03/04/2020 CLINICAL DATA:  Larey Seat while getting out of bed, right hip and lower back pain EXAM: PELVIS - 1-2 VIEW COMPARISON:  None.  FINDINGS: Single frontal view of the pelvis was performed.  There are no acute displaced fractures. The hips are well aligned. Mild symmetrical bilateral hip osteoarthritis. Visualized portions the lumbar spine are normal. Evidence of previous ventral hernia repair. IMPRESSION: 1. No acute displaced fracture. Electronically Signed   By: Sharlet Salina M.D.   On: 03/04/2020 15:38   CT Head Wo Contrast  Result Date: 03/04/2020 CLINICAL DATA:  Fall EXAM: CT HEAD WITHOUT CONTRAST TECHNIQUE: Contiguous axial images were obtained from the base of the skull through the vertex without intravenous contrast. COMPARISON:  August 30, 2018 FINDINGS: Brain: There is no acute intracranial hemorrhage, mass effect, or edema. No new loss of gray-white differentiation. Chronic infarcts of the left parieto-occipital lobes, left central white matter, left basal ganglia, and left thalamus. There is associated ex vacuo dilatation of the left lateral ventricle. Additional patchy and confluent areas of hypoattenuation are nonspecific but probably reflects stable chronic microvascular ischemic changes. Prominence of ventricles and sulci reflects generalized parenchymal volume loss. There is no extra-axial collection. Vascular: There is atherosclerotic calcification at the skull base. Skull: Calvarium is intact. Sinuses/Orbits: Dictated separately. Other: Left frontal scalp hematoma. IMPRESSION: No evidence of acute intracranial injury. Stable chronic findings detailed above. Electronically Signed   By: Guadlupe Spanish M.D.   On: 03/04/2020 16:15   CT Cervical Spine Wo Contrast  Result Date: 03/04/2020 CLINICAL DATA:  Fall, facial trauma EXAM: CT CERVICAL SPINE WITHOUT CONTRAST TECHNIQUE: Multidetector CT imaging of the cervical spine was performed without intravenous contrast. Multiplanar CT image reconstructions were also generated. COMPARISON:  None. FINDINGS: Alignment: Preserved. Skull base and vertebrae: Cervical vertebral body  heights are maintained. There is no acute fracture. Soft tissues and spinal canal: No prevertebral fluid or swelling. No visible canal hematoma. Disc levels:  No significant degenerative changes. Upper chest: Included lung apices are clear. Other: Calcified plaque at the common carotid bifurcations. IMPRESSION: No acute cervical spine fracture. Electronically Signed   By: Guadlupe Spanish M.D.   On: 03/04/2020 16:19   DG Knee Complete 4 Views Right  Result Date: 03/04/2020 CLINICAL DATA:  Larey Seat out of bed, right knee bruising EXAM: RIGHT KNEE - COMPLETE 4+ VIEW COMPARISON:  None. FINDINGS: Frontal, bilateral oblique, and lateral views of the right knee are obtained. The bones are osteopenic. No fracture, subluxation, or dislocation. Mild 3 compartmental osteoarthritis. No joint effusion. IMPRESSION: 1. Mild 3 compartmental osteoarthritis. 2. No acute fracture. 3. Osteopenia. Electronically Signed   By: Sharlet Salina M.D.   On: 03/04/2020 15:39   DG Femur Min 2 Views Right  Result Date: 03/04/2020 CLINICAL DATA:  Larey Seat, right hip pain EXAM: RIGHT FEMUR 2 VIEWS COMPARISON:  None. FINDINGS: Frontal and lateral views of the right femur are obtained. No acute fractures. Mild osteoarthritis of the right hip and knee. Prominent enthesopathic changes of the greater trochanter. Diffuse atherosclerosis. Soft tissues are unremarkable. IMPRESSION: 1. Osteoarthritis of the right hip and knee. 2. No acute fracture. Electronically Signed   By: Sharlet Salina M.D.   On: 03/04/2020 15:41   CT Maxillofacial WO CM  Result Date: 03/04/2020 CLINICAL DATA:  Fall EXAM: CT MAXILLOFACIAL WITHOUT CONTRAST TECHNIQUE: Multidetector CT imaging of the maxillofacial structures was performed. Multiplanar CT image reconstructions were also generated. COMPARISON:  None. FINDINGS: Motion artifact is present. Osseous: Irregularity of the nasal bones likely reflecting nondisplaced fractures. Degenerative changes at the temporomandibular  joints. Orbits: Left periorbital soft tissue swelling. No intraorbital hematoma. Sinuses: Mild mucosal thickening and layering secretions. Soft tissues: Left frontal scalp hematoma extending into the periorbital  and paranasal soft tissues. Limited intracranial: Dictated separately. IMPRESSION: Acute nondisplaced bilateral nasal bone fractures. Electronically Signed   By: Guadlupe Spanish M.D.   On: 03/04/2020 16:11    Procedures Procedures (including critical care time)  Medications Ordered in ED Medications - No data to display  ED Course  I have reviewed the triage vital signs and the nursing notes.  Pertinent labs & imaging results that were available during my care of the patient were reviewed by me and considered in my medical decision making (see chart for details).    MDM Rules/Calculators/A&P                         Patient presents to the ED S/p fall with head injury.  Nontoxic, vitals without significant abnormality.  CT head, Maxillofacial, & C spine as well as X-rays of the L spine, pelvis, R femur & R knee ordered, reviewed, & interpreted by me- agree with radiologist read, nasal fx noted.   - CT head- no bleed - CT C spine & L spine x-ray- without acute injury, no point/focal or additional vertebral tenderness to palpation- doubt acute fx of the spine.  - CT maxillofacial- frontal scalp hematoma consistent w/ exam, nondisplaced fx of bilateral nares present, on initial exam clotted blood was removed from the L nare, no septal hematoma or deviation noted, on re-assessment remains with no active bleed or hematoma present. Discussed no nose blowing. Application of ice PRN.  - No chest/abodminal tenderness.  - X-rays of the pelvis & R femur/knee- no fxs, given patient is able to actively lift her RLE off of the stretcher and is able to actively flex her knee some, and I am able to passively range without significant pain I have low suspicion for acute fracture/dislocation.  She is  nonambulatory at baseline.  Do not feel that CT imaging further assessment is necessary at this time based on reassuring x-rays & H&P and low suspicion for occult fx, discussed with supervising physician Dr. [name] who is in agreement.  Patient overall appears appropriate for discharge back to facility at this time.I discussed results, treatment plan, need for follow-up, and return precautions with the patient. Provided opportunity for questions, patient confirmed understanding and is in agreement with plan.   Final Clinical Impression(s) / ED Diagnoses Final diagnoses:  Fall, initial encounter  Hematoma of frontal scalp, initial encounter  Closed fracture of nasal bone, initial encounter    Rx / DC Orders ED Discharge Orders    None       Cherly Anderson, PA-C 03/04/20 1657    LongArlyss Repress, MD 03/05/20 [id number]

## 2020-03-04 NOTE — ED Notes (Signed)
Attempted to call report x3 to [facility]

## 2020-03-04 NOTE — Discharge Instructions (Addendum)
You were seen in the emergency department today after a fall.  The CT of your head did not show any intracranial bleeding.  Your CT of your neck and right lower extremity did not show any fractures.  Your CT of your face did show a scalp hematoma to the left forehead area which is why you have so much swelling there.  Your CT of your face also showed that you have a nasal fracture.  Please follow attached nasal fracture guidelines, do not blow your nose, apply ice wrapped in a towel 20 minutes on 40 minutes off to the nose as well as to the forehead for the next 24 to 48 hours.  Please follow-up with your primary care provider and/or ear nose and throat within 3 days.  Return to the ER for new or worsening symptoms including but not limited to uncontrollable recurrence of nosebleed, change in your vision, increased pain, inability to keep fluids down, fever, chest pain, trouble breathing, abdominal pain, blood in urine/stool, or any other concerns.

## 2020-03-04 NOTE — ED Triage Notes (Signed)
Pt fell at [facility] trying to get out of the bed at facility.  Pt with hematoma to left forehead, nose bleed and swelling.

## 2020-03-04 NOTE — ED Notes (Signed)
Removed cervical collar. Refuses to wear- says it feels like its choking her. Attempted to educate on importance of wearing collar but continued to refuse.

## 2020-03-04 NOTE — ED Notes (Signed)
Reported no LOC  cbg checked by [facility] and was 110

## 2020-03-05 DIAGNOSIS — S0083XA Contusion of other part of head, initial encounter: Secondary | ICD-10-CM | POA: Diagnosis not present

## 2020-03-05 DIAGNOSIS — S022XXA Fracture of nasal bones, initial encounter for closed fracture: Secondary | ICD-10-CM | POA: Diagnosis not present

## 2020-03-06 DIAGNOSIS — R1312 Dysphagia, oropharyngeal phase: Secondary | ICD-10-CM | POA: Diagnosis not present

## 2020-03-06 DIAGNOSIS — I6930 Unspecified sequelae of cerebral infarction: Secondary | ICD-10-CM | POA: Diagnosis not present

## 2020-03-06 DIAGNOSIS — Z741 Need for assistance with personal care: Secondary | ICD-10-CM | POA: Diagnosis not present

## 2020-03-06 DIAGNOSIS — R41841 Cognitive communication deficit: Secondary | ICD-10-CM | POA: Diagnosis not present

## 2020-03-07 DIAGNOSIS — R41841 Cognitive communication deficit: Secondary | ICD-10-CM | POA: Diagnosis not present

## 2020-03-07 DIAGNOSIS — R1312 Dysphagia, oropharyngeal phase: Secondary | ICD-10-CM | POA: Diagnosis not present

## 2020-03-07 DIAGNOSIS — Z741 Need for assistance with personal care: Secondary | ICD-10-CM | POA: Diagnosis not present

## 2020-03-07 DIAGNOSIS — I6930 Unspecified sequelae of cerebral infarction: Secondary | ICD-10-CM | POA: Diagnosis not present

## 2020-03-08 DIAGNOSIS — R41841 Cognitive communication deficit: Secondary | ICD-10-CM | POA: Diagnosis not present

## 2020-03-08 DIAGNOSIS — Z741 Need for assistance with personal care: Secondary | ICD-10-CM | POA: Diagnosis not present

## 2020-03-08 DIAGNOSIS — I6930 Unspecified sequelae of cerebral infarction: Secondary | ICD-10-CM | POA: Diagnosis not present

## 2020-03-08 DIAGNOSIS — R1312 Dysphagia, oropharyngeal phase: Secondary | ICD-10-CM | POA: Diagnosis not present

## 2020-03-10 DIAGNOSIS — E1165 Type 2 diabetes mellitus with hyperglycemia: Secondary | ICD-10-CM | POA: Diagnosis not present

## 2020-03-11 DIAGNOSIS — F3132 Bipolar disorder, current episode depressed, moderate: Secondary | ICD-10-CM | POA: Diagnosis not present

## 2020-03-11 DIAGNOSIS — Z741 Need for assistance with personal care: Secondary | ICD-10-CM | POA: Diagnosis not present

## 2020-03-11 DIAGNOSIS — R4589 Other symptoms and signs involving emotional state: Secondary | ICD-10-CM | POA: Diagnosis not present

## 2020-03-11 DIAGNOSIS — F319 Bipolar disorder, unspecified: Secondary | ICD-10-CM | POA: Diagnosis not present

## 2020-03-11 DIAGNOSIS — R1312 Dysphagia, oropharyngeal phase: Secondary | ICD-10-CM | POA: Diagnosis not present

## 2020-03-11 DIAGNOSIS — R41841 Cognitive communication deficit: Secondary | ICD-10-CM | POA: Diagnosis not present

## 2020-03-11 DIAGNOSIS — F329 Major depressive disorder, single episode, unspecified: Secondary | ICD-10-CM | POA: Diagnosis not present

## 2020-03-11 DIAGNOSIS — I6930 Unspecified sequelae of cerebral infarction: Secondary | ICD-10-CM | POA: Diagnosis not present

## 2020-03-11 DIAGNOSIS — F411 Generalized anxiety disorder: Secondary | ICD-10-CM | POA: Diagnosis not present

## 2020-03-12 DIAGNOSIS — S022XXA Fracture of nasal bones, initial encounter for closed fracture: Secondary | ICD-10-CM | POA: Diagnosis not present

## 2020-03-12 DIAGNOSIS — Z741 Need for assistance with personal care: Secondary | ICD-10-CM | POA: Diagnosis not present

## 2020-03-12 DIAGNOSIS — R1312 Dysphagia, oropharyngeal phase: Secondary | ICD-10-CM | POA: Diagnosis not present

## 2020-03-12 DIAGNOSIS — R41841 Cognitive communication deficit: Secondary | ICD-10-CM | POA: Diagnosis not present

## 2020-03-12 DIAGNOSIS — I6930 Unspecified sequelae of cerebral infarction: Secondary | ICD-10-CM | POA: Diagnosis not present

## 2020-03-13 DIAGNOSIS — I6930 Unspecified sequelae of cerebral infarction: Secondary | ICD-10-CM | POA: Diagnosis not present

## 2020-03-13 DIAGNOSIS — Z1612 Extended spectrum beta lactamase (ESBL) resistance: Secondary | ICD-10-CM | POA: Diagnosis not present

## 2020-03-13 DIAGNOSIS — R41841 Cognitive communication deficit: Secondary | ICD-10-CM | POA: Diagnosis not present

## 2020-03-13 DIAGNOSIS — N390 Urinary tract infection, site not specified: Secondary | ICD-10-CM | POA: Diagnosis not present

## 2020-03-13 DIAGNOSIS — Z741 Need for assistance with personal care: Secondary | ICD-10-CM | POA: Diagnosis not present

## 2020-03-13 DIAGNOSIS — R1312 Dysphagia, oropharyngeal phase: Secondary | ICD-10-CM | POA: Diagnosis not present

## 2020-03-14 DIAGNOSIS — R41841 Cognitive communication deficit: Secondary | ICD-10-CM | POA: Diagnosis not present

## 2020-03-14 DIAGNOSIS — R1312 Dysphagia, oropharyngeal phase: Secondary | ICD-10-CM | POA: Diagnosis not present

## 2020-03-14 DIAGNOSIS — I6930 Unspecified sequelae of cerebral infarction: Secondary | ICD-10-CM | POA: Diagnosis not present

## 2020-03-14 DIAGNOSIS — Z741 Need for assistance with personal care: Secondary | ICD-10-CM | POA: Diagnosis not present

## 2020-03-15 ENCOUNTER — Encounter (HOSPITAL_COMMUNITY): Payer: Self-pay | Admitting: Emergency Medicine

## 2020-03-15 ENCOUNTER — Emergency Department (HOSPITAL_COMMUNITY)
Admission: EM | Admit: 2020-03-15 | Discharge: 2020-03-16 | Disposition: A | Discharge status: Home / Self Care | Payer: Medicare Other | Attending: Emergency Medicine | Admitting: Emergency Medicine

## 2020-03-15 ENCOUNTER — Emergency Department (HOSPITAL_COMMUNITY): Payer: Medicare Other

## 2020-03-15 ENCOUNTER — Other Ambulatory Visit: Payer: Self-pay

## 2020-03-15 DIAGNOSIS — F0390 Unspecified dementia without behavioral disturbance: Secondary | ICD-10-CM | POA: Diagnosis not present

## 2020-03-15 DIAGNOSIS — S022XXA Fracture of nasal bones, initial encounter for closed fracture: Secondary | ICD-10-CM | POA: Diagnosis not present

## 2020-03-15 DIAGNOSIS — N179 Acute kidney failure, unspecified: Secondary | ICD-10-CM

## 2020-03-15 DIAGNOSIS — S8011XA Contusion of right lower leg, initial encounter: Secondary | ICD-10-CM | POA: Insufficient documentation

## 2020-03-15 DIAGNOSIS — Z043 Encounter for examination and observation following other accident: Secondary | ICD-10-CM | POA: Diagnosis not present

## 2020-03-15 DIAGNOSIS — Z7902 Long term (current) use of antithrombotics/antiplatelets: Secondary | ICD-10-CM | POA: Diagnosis not present

## 2020-03-15 DIAGNOSIS — S0083XA Contusion of other part of head, initial encounter: Secondary | ICD-10-CM | POA: Diagnosis not present

## 2020-03-15 DIAGNOSIS — S40022A Contusion of left upper arm, initial encounter: Secondary | ICD-10-CM | POA: Insufficient documentation

## 2020-03-15 DIAGNOSIS — R41841 Cognitive communication deficit: Secondary | ICD-10-CM | POA: Diagnosis not present

## 2020-03-15 DIAGNOSIS — Z79899 Other long term (current) drug therapy: Secondary | ICD-10-CM | POA: Diagnosis not present

## 2020-03-15 DIAGNOSIS — Z741 Need for assistance with personal care: Secondary | ICD-10-CM | POA: Diagnosis not present

## 2020-03-15 DIAGNOSIS — I10 Essential (primary) hypertension: Secondary | ICD-10-CM | POA: Diagnosis not present

## 2020-03-15 DIAGNOSIS — S0093XA Contusion of unspecified part of head, initial encounter: Secondary | ICD-10-CM | POA: Diagnosis not present

## 2020-03-15 DIAGNOSIS — S40021A Contusion of right upper arm, initial encounter: Secondary | ICD-10-CM | POA: Diagnosis not present

## 2020-03-15 DIAGNOSIS — R1312 Dysphagia, oropharyngeal phase: Secondary | ICD-10-CM | POA: Diagnosis not present

## 2020-03-15 DIAGNOSIS — W19XXXA Unspecified fall, initial encounter: Secondary | ICD-10-CM | POA: Diagnosis not present

## 2020-03-15 DIAGNOSIS — Z794 Long term (current) use of insulin: Secondary | ICD-10-CM | POA: Diagnosis not present

## 2020-03-15 DIAGNOSIS — S1093XA Contusion of unspecified part of neck, initial encounter: Secondary | ICD-10-CM | POA: Diagnosis not present

## 2020-03-15 DIAGNOSIS — Y92129 Unspecified place in nursing home as the place of occurrence of the external cause: Secondary | ICD-10-CM | POA: Insufficient documentation

## 2020-03-15 DIAGNOSIS — S0003XA Contusion of scalp, initial encounter: Secondary | ICD-10-CM | POA: Diagnosis not present

## 2020-03-15 DIAGNOSIS — S301XXA Contusion of abdominal wall, initial encounter: Secondary | ICD-10-CM | POA: Insufficient documentation

## 2020-03-15 DIAGNOSIS — S8012XA Contusion of left lower leg, initial encounter: Secondary | ICD-10-CM | POA: Diagnosis not present

## 2020-03-15 DIAGNOSIS — R404 Transient alteration of awareness: Secondary | ICD-10-CM | POA: Diagnosis not present

## 2020-03-15 DIAGNOSIS — Z91040 Latex allergy status: Secondary | ICD-10-CM | POA: Insufficient documentation

## 2020-03-15 DIAGNOSIS — E1142 Type 2 diabetes mellitus with diabetic polyneuropathy: Secondary | ICD-10-CM | POA: Diagnosis not present

## 2020-03-15 DIAGNOSIS — I6930 Unspecified sequelae of cerebral infarction: Secondary | ICD-10-CM | POA: Diagnosis not present

## 2020-03-15 DIAGNOSIS — R531 Weakness: Secondary | ICD-10-CM | POA: Diagnosis not present

## 2020-03-15 HISTORY — DX: Anxiety disorder, unspecified: F41.9

## 2020-03-15 HISTORY — DX: Dysphagia, unspecified: R13.10

## 2020-03-15 HISTORY — DX: Unspecified dementia, unspecified severity, without behavioral disturbance, psychotic disturbance, mood disturbance, and anxiety: F03.90

## 2020-03-15 HISTORY — DX: Apraxia following cerebral infarction: I69.390

## 2020-03-15 HISTORY — DX: Anemia, unspecified: D64.9

## 2020-03-15 HISTORY — DX: Aphasia following cerebral infarction: I69.320

## 2020-03-15 HISTORY — DX: Acute embolism and thrombosis of unspecified deep veins of unspecified lower extremity: I82.409

## 2020-03-15 HISTORY — DX: Muscle weakness (generalized): M62.81

## 2020-03-15 HISTORY — DX: Major depressive disorder, single episode, unspecified: F32.9

## 2020-03-15 HISTORY — DX: Bipolar disorder, unspecified: F31.9

## 2020-03-15 HISTORY — DX: Hemiplegia and hemiparesis following other cerebrovascular disease affecting right non-dominant side: I69.853

## 2020-03-15 HISTORY — DX: Vascular dementia, unspecified severity, without behavioral disturbance, psychotic disturbance, mood disturbance, and anxiety: F01.50

## 2020-03-15 HISTORY — DX: Extended spectrum beta lactamase (ESBL) resistance: Z16.12

## 2020-03-15 LAB — CBC WITH DIFFERENTIAL/PLATELET
Abs Immature Granulocytes: 0.01 10*3/uL (ref 0.00–0.07)
Basophils Absolute: 0 10*3/uL (ref 0.0–0.1)
Basophils Relative: 0 %
Eosinophils Absolute: 0 10*3/uL (ref 0.0–0.5)
Eosinophils Relative: 1 %
HCT: 30.9 % — ABNORMAL LOW (ref 36.0–46.0)
Hemoglobin: 9.5 g/dL — ABNORMAL LOW (ref 12.0–15.0)
Immature Granulocytes: 0 %
Lymphocytes Relative: 34 %
Lymphs Abs: 1.2 10*3/uL (ref 0.7–4.0)
MCH: 30.4 pg (ref 26.0–34.0)
MCHC: 30.7 g/dL (ref 30.0–36.0)
MCV: 98.7 fL (ref 80.0–100.0)
Monocytes Absolute: 0.3 10*3/uL (ref 0.1–1.0)
Monocytes Relative: 8 %
Neutro Abs: 2.1 10*3/uL (ref 1.7–7.7)
Neutrophils Relative %: 57 %
Platelets: 161 10*3/uL (ref 150–400)
RBC: 3.13 MIL/uL — ABNORMAL LOW (ref 3.87–5.11)
RDW: 14.6 % (ref 11.5–15.5)
WBC: 3.7 10*3/uL — ABNORMAL LOW (ref 4.0–10.5)
nRBC: 0 % (ref 0.0–0.2)

## 2020-03-15 LAB — BASIC METABOLIC PANEL
Anion gap: 9 (ref 5–15)
BUN: 36 mg/dL — ABNORMAL HIGH (ref 6–20)
CO2: 28 mmol/L (ref 22–32)
Calcium: 9.5 mg/dL (ref 8.9–10.3)
Chloride: 103 mmol/L (ref 98–111)
Creatinine, Ser: 1.89 mg/dL — ABNORMAL HIGH (ref 0.44–1.00)
GFR, Estimated: 31 mL/min — ABNORMAL LOW (ref >60)
Glucose, Bld: 141 mg/dL — ABNORMAL HIGH (ref 70–99)
Potassium: 3.6 mmol/L (ref 3.5–5.1)
Sodium: 140 mmol/L (ref 135–145)

## 2020-03-15 LAB — CK: Total CK: 91 U/L (ref 38–234)

## 2020-03-15 MED ORDER — SODIUM CHLORIDE 0.9 % IV BOLUS
1000.0000 mL | Freq: Once | INTRAVENOUS | Status: AC
  Administered 2020-03-15: 1000 mL via INTRAVENOUS

## 2020-03-15 NOTE — Discharge Instructions (Addendum)
Please have your kidney function rechecked in 1 week as it was elevated today. Push fluids until then.   Follow up with your PCP regarding ED visit today  Return to the ED for any worsening symtpoms

## 2020-03-15 NOTE — ED Notes (Signed)
Call from pt daughter   Randa Spike [phone]

## 2020-03-15 NOTE — ED Triage Notes (Addendum)
[facility] - pt fell from bed sometime after 1830. Pt seen here 12/20 for a fall. Staff at SNF states pt fell out of bed and broke her nose x 2 days ago, but they did not send her to ED. Pt A&O x1 and nonverbal which is normal for pt. Pt has bruising around bilateral eyes.  Pt has multiple bruises in different stages of healing on body, including left side of neck and bilateral legs.

## 2020-03-15 NOTE — ED Provider Notes (Signed)
[facility] EMERGENCY DEPARTMENT Provider Note   CSN: [id number] Arrival date & time: 03/15/20  1947     History Chief Complaint  Patient presents with  . Fall   LEVEL 5 CAVEAT - HISTORY OF APHASIA AND VASCULAR DEMENTIA  Kelly Romero is a 54 y.o. female with PMHx HTN, HLD, CVA on Plavix, Vascular dementia, Aphasia who presents to the ED via EMS from [facility] [place] [facility] for fall. Per EMS pt was last seen in bed at 5:30 PM. 5 minutes later she was found faced down on the ground moaning which is when the facility called EMS. Pt is at  Baseline per EMS. She is found to have bruising on her face, neck, and various parts of her body that appear old. Pt was seen in the ED on 12/20 for fall and was diagnosed with a nasal fracture.   Additional information obtained by staff at facility - reports that the bruising is old however given her fall they called EMS to evaluate.   The history is provided by the patient, the EMS personnel, the nursing home and medical records. The history is limited by the condition of the patient.       Past Medical History:  Diagnosis Date  . Anemia   . Anxiety   . Aphasia following cerebral infarction   . Apraxia following cerebral infarction   . Bipolar 1 disorder (HCC)   . Bipolar 1 disorder (HCC)   . Dementia (HCC)   . Diabetes mellitus without complication (HCC)   . DVT (deep venous thrombosis) (HCC)   . Dysphagia   . Extended spectrum beta lactamase (ESBL) resistance   . Headache   . 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 Hyperlipidemia   . Hypertension   . MDD (major depressive disorder)   . Muscle weakness   . Stroke (HCC)   . TIA (transient ischemic attack)   . Vascular dementia (HCC)    per pt chart in epic    Patient Active Problem List   Diagnosis Date Noted  . Hemiparesis affecting right side as late effect of cerebrovascular accident (HCC) 09/14/2017   . Extension of stroke (HCC) 08/06/2017  . Inadequate dietary intake 08/06/2017  . Hypoalbuminemia due to protein-calorie malnutrition (HCC)   . Orthostatic hypotension   . Poorly controlled type 2 diabetes mellitus with peripheral neuropathy (HCC)   . Emotional lability   . Thrombotic stroke (HCC) 07/13/2017  . Diabetes mellitus type 2 in obese (HCC)   . History of CVA (cerebrovascular accident)   . Benign essential HTN   . Late effect of cerebrovascular accident (CVA)   . Acute blood loss anemia   . Morbid obesity (HCC)   . Right hemiparesis (HCC)   . Cryptogenic stroke (HCC) 07/08/2017  . Chest pain 11/06/2016  . Mid (multi infarct dementia), without behavioral disturbance (HCC) 04/10/2016  . Aphasia as late effect of stroke 04/10/2016  . Encephalopathy   . Cerebral thrombosis with cerebral infarction 12/29/2015  . Hyperglycemia   . Hyperlipidemia   . Hypertensive emergency   . Deep venous thrombosis (HCC)   . AKI (acute kidney injury) (HCC) 12/26/2015  . Altered mental status   . Stroke-like symptoms   . Acute encephalopathy 08/17/2015  . Anxiety and depression   . CVA (cerebrovascular accident due to intracerebral hemorrhage) (HCC) 08/15/2015  . Stroke (HCC)   . Encephalopathy, metabolic 12/28/2014  . Uncontrolled type 2 diabetes mellitus with  diabetic polyneuropathy (HCC) 12/28/2014  . Essential hypertension 12/28/2014  . New onset headache 11/02/2014    Past Surgical History:  Procedure Laterality Date  . APPENDECTOMY    . CHOLECYSTECTOMY    . FOOT GANGLION EXCISION    . HERNIA REPAIR    . IR GASTROSTOMY TUBE MOD SED  07/30/2017  . IR GASTROSTOMY TUBE REMOVAL  07/15/2018  . IR GENERIC HISTORICAL  12/30/2015   IR ANGIO INTRA EXTRACRAN SEL COM CAROTID INNOMINATE BILAT MOD SED 12/30/2015 Julieanne Cotton, MD [facility]  . IR GENERIC HISTORICAL  12/30/2015   IR ANGIO VERTEBRAL SEL VERTEBRAL BILAT MOD SED 12/30/2015 Julieanne Cotton, MD [facility]  . LOOP  RECORDER INSERTION N/A 07/13/2017   Procedure: LOOP RECORDER INSERTION;  Surgeon: Hillis Range, MD;  Location: [facility] INVASIVE CV LAB;  Service: Cardiovascular;  Laterality: N/A;  . TEE WITHOUT CARDIOVERSION N/A 04/19/2015   Procedure: TRANSESOPHAGEAL ECHOCARDIOGRAM (TEE);  Surgeon: Jake Bathe, MD;  Location: [facility] END[facility]COPY;  Service: Cardiovascular;  Laterality: N/A;  . VAGINAL HYSTERECTOMY       OB History   No obstetric history on file.     Family History  Problem Relation Age of Onset  . Stroke Father        1s  . Heart attack Father 66  . COPD Mother   . Heart failure Brother   . Cancer Maternal Grandmother        unknown   . COPD Other   . Heart failure Maternal Grandfather   . Hypertension Maternal Grandfather   . Cancer Paternal Grandfather        lung  . Diabetes Paternal Grandmother     Social History   Tobacco Use  . Smoking status: Never Smoker  . Smokeless tobacco: Never Used  Vaping Use  . Vaping Use: Never used  Substance Use Topics  . Alcohol use: No    Alcohol/week: 0.0 standard drinks    Comment: rare   . Drug use: No    Home Medications Prior to Admission medications   Medication Sig Start Date End Date Taking? Authorizing Provider  cephALEXin (KEFLEX) 500 MG capsule Take 1 capsule (500 mg total) by mouth 3 (three) times daily. 09/01/18   Shaune Pollack, MD  clopidogrel (PLAVIX) 75 MG tablet Take 1 tablet (75 mg total) by mouth daily. 08/06/17   Love, Evlyn Kanner, PA-C  divalproex (DEPAKOTE) 250 MG DR tablet Take 1 tablet (250 mg total) by mouth 2 (two) times daily. 08/06/17 08/30/19  Love, Evlyn Kanner, PA-C  escitalopram (LEXAPRO) 20 MG tablet Take 1 tablet (20 mg total) by mouth daily. 07/18/18   Everlena Cooper, Adam R, DO  insulin detemir (LEVEMIR) 100 UNIT/ML injection Inject 0.2 mLs (20 Units total) into the skin daily before breakfast. Patient taking differently: Inject 20 Units into the skin daily.  08/06/17   Love, Evlyn Kanner, PA-C  lisinopril (ZESTRIL) 5 MG tablet  Take 5 mg by mouth daily.     [provider]  metoprolol tartrate (LOPRESSOR) 25 MG tablet Take 0.5 tablets (12.5 mg total) by mouth 2 (two) times daily. 08/06/17   Love, Evlyn Kanner, PA-C  polyethylene glycol [facility] Ethelene Hal) packet May take 17 g with 8 oz water per peg tube daily prn 08/10/17   Kirsteins, Victorino Sparrow, MD  rosuvastatin (CRESTOR) 40 MG tablet Take 1 tablet (40 mg total) by mouth at bedtime. 08/06/17   Love, Evlyn Kanner, PA-C    Allergies    Erythromycin and Latex  Review of Systems   Review of Systems  Unable to perform ROS: Dementia    Physical Exam Updated Vital Signs BP 118/70 (BP Location: Left Arm)   Pulse 62   Temp 98 F (36.7 C) (Oral)   Resp 16   Ht 5\' 5"  (1.651 m)   Wt 77.1 kg   SpO2 99%   BMI 28.29 kg/m   Physical Exam Vitals and nursing note reviewed.  Constitutional:      Appearance: She is not ill-appearing.  HENT:     Head:     Comments: + Raccoon sign bilaterally however does appear to be old bruising. Negative hemotympanum bilaterally Eyes:     Extraocular Movements: Extraocular movements intact.     Conjunctiva/sclera: Conjunctivae normal.     Pupils: Pupils are equal, round, and reactive to light.  Neck:     Comments: Old bruising noted to lateral aspect of left neck. No midline TTP Cardiovascular:     Rate and Rhythm: Normal rate and regular rhythm.  Pulmonary:     Effort: Pulmonary effort is normal.     Breath sounds: Normal breath sounds. No wheezing, rhonchi or rales.  Abdominal:     Palpations: Abdomen is soft.     Comments: Ecchymosis noted to lower abdomen with mild TTP; no rebound or guarding  Musculoskeletal:     Cervical back: Neck supple. No tenderness.     Comments: No T or L midline TTP/step offs or deformities. No ecchymosis appreciated to back.   Old bruising noted to various aspects of BUE and BLEs. Pt able to move all extremities without difficulty. No TTP to BUE and BLEs. 2+ distal pulses.   Skin:     General: Skin is warm and dry.  Neurological:     Mental Status: She is alert. Mental status is at baseline.     ED Results / Procedures / Treatments   Labs (all labs ordered are listed, but only abnormal results are displayed) Labs Reviewed  BASIC METABOLIC PANEL - Abnormal; Notable for the following components:      Result Value   Glucose, Bld 141 (*)    BUN 36 (*)    Creatinine, Ser 1.89 (*)    GFR, Estimated 31 (*)    All other components within normal limits  CBC WITH DIFFERENTIAL/PLATELET - Abnormal; Notable for the following components:   WBC 3.7 (*)    RBC 3.13 (*)    Hemoglobin 9.5 (*)    HCT 30.9 (*)    All other components within normal limits  CK    EKG None  Radiology CT Head Wo Contrast  Result Date: 03/15/2020 CLINICAL DATA:  Head trauma, coagulopathy (Age 36-64y) Fall from bed. Nursing facility staff state patient fell out of bed and broken is 2 days ago. EXAM: CT HEAD WITHOUT CONTRAST TECHNIQUE: Contiguous axial images were obtained from the base of the skull through the vertex without intravenous contrast. COMPARISON:  Head CT 03/04/2020 FINDINGS: Brain: No acute hemorrhage. No subdural or extra-axial collection. Stable degree of atrophy and chronic small vessel ischemia. Again seen remote infarcts in the left basal ganglia, thalamus, central white matter and left parietooccipital lobe. No evidence of new or acute ischemia. No midline shift, hydrocephalus or mass effect. Mild ex vacuo dilatation of the left lateral ventricle. Stable generalized chronic small vessel ischemia. Vascular: Atherosclerosis of skullbase vasculature without hyperdense vessel or abnormal calcification. Skull: No fracture or focal lesion. Frontal hyperostosis again seen. Sinuses/Orbits: Assessed on concurrent face CT,  reported separately. Other: Left frontal scalp hematoma, mildly improved from prior. IMPRESSION: 1. Left frontal scalp hematoma, mildly improved from prior. No acute  intracranial abnormality. No skull fracture. 2. Stable atrophy, chronic small vessel ischemia, and remote infarcts. Electronically Signed   By: Narda Rutherford M.D.   On: 03/15/2020 22:13   CT Cervical Spine Wo Contrast  Result Date: 03/15/2020 CLINICAL DATA:  head trauma Fall from bed. EXAM: CT CERVICAL SPINE WITHOUT CONTRAST TECHNIQUE: Multidetector CT imaging of the cervical spine was performed without intravenous contrast. Multiplanar CT image reconstructions were also generated. COMPARISON:  CT 03/04/2020 FINDINGS: Alignment: Straightening and slight reversal of normal lordosis. No traumatic subluxation Skull base and vertebrae: No acute fracture. Vertebral body heights are maintained. The dens and skull base are intact. Soft tissues and spinal canal: No prevertebral fluid or swelling. No visible canal hematoma. Disc levels: Minor endplate spurring at C5-C6 without significant disc space narrowing. Upper chest: No acute or unexpected findings. Other: Carotid calcifications. IMPRESSION: 1. No acute fracture or subluxation of the cervical spine. 2. Straightening and slight reversal of normal lordosis may be due to positioning or muscle spasm. Electronically Signed   By: Narda Rutherford M.D.   On: 03/15/2020 22:17   DG Pelvis Portable  Result Date: 03/15/2020 CLINICAL DATA:  Larey Seat tonight. EXAM: PORTABLE PELVIS 1-2 VIEWS COMPARISON:  None. FINDINGS: Degenerative changes in the lower lumbar spine and hips. Pelvis appears intact. No acute displaced fractures identified. SI joints and symphysis pubis are not displaced. Surgical clips consistent with mesh hernia repair. Vascular calcifications. IMPRESSION: No acute displaced fractures identified. Electronically Signed   By: Burman Nieves M.D.   On: 03/15/2020 21:52   DG Chest Portable 1 View  Result Date: 03/15/2020 CLINICAL DATA:  rule out pneumopertineum Fall from bed. EXAM: PORTABLE CHEST 1 VIEW COMPARISON:  Chest radiograph 06/28/2018 FINDINGS:  Loop recorder projects over the left chest wall.The cardiomediastinal contours are normal. The lungs are clear. Pulmonary vasculature is normal. No consolidation, pleural effusion, or pneumothorax. No acute osseous abnormalities are seen. No evidence of pneumoperitoneum. No subcutaneous gas. IMPRESSION: 1. No acute chest finding. 2. No evidence of pneumoperitoneum 3. Electronically Signed   By: Narda Rutherford M.D.   On: 03/15/2020 21:17   CT Maxillofacial Wo Contrast  Result Date: 03/15/2020 CLINICAL DATA:  Fall from bed. Nursing home staff state patient fell out of bed and broke her nose 2 days ago. EXAM: CT MAXILLOFACIAL WITHOUT CONTRAST TECHNIQUE: Multidetector CT imaging of the maxillofacial structures was performed. Multiplanar CT image reconstructions were also generated. COMPARISON:  Face CT 03/04/2020 FINDINGS: Osseous: Stable cortical irregularity about the right greater than left nasal bone from prior exam consistent with subacute fracture. No change in alignment from prior exam. No new nasal bone fracture. There is slight right nasal septal deviation. The zygomatic arches and mandibles are intact. Multiple absent teeth, scattered dental caries. Temporomandibular joints are congruent. Orbits: No acute orbital fracture. Globes are intact. Bilateral cataract resection. Sinuses: No sinus fracture or fluid level. No significant mucosal thickening. Mastoid air cells are clear. Soft tissues: Improving left frontal scalp hematoma extending into the periorbital soft tissues. No new soft tissue abnormality. Limited intracranial: Assessed on concurrent head CT, reported separately. IMPRESSION: 1. Subacute bilateral nasal bone fractures. No change in alignment from 03/04/2020 CT. 2. No new facial bone fracture. 3. Improving left frontal scalp hematoma extending into the periorbital soft tissues. Electronically Signed   By: Narda Rutherford M.D.   On: 03/15/2020 22:52  Procedures Procedures (including  critical care time)  Medications Ordered in ED Medications  sodium chloride 0.9 % bolus 1,000 mL (1,000 mLs Intravenous New Bag/Given 03/15/20 2247)    ED Course  I have reviewed the triage vital signs and the nursing notes.  Pertinent labs & imaging results that were available during my care of the patient were reviewed by me and considered in my medical decision making (see chart for details).    MDM Rules/Calculators/A&P                          54 year old female who presents to the ED today secondary to fall.  Nursing facility states that patient fell and was on the floor for approximately 5 minutes before being found.  She is on Plavix.  Questionable head injury.  Patient is found to have multiple sites of bruising to her body which do appear old in nature.  She was seen in the ED on 12/20 for a fall and diagnosed with a nasal fracture.  On exam today she has bilateral raccoon sign however it does appear old.  She has negative hemotympanum bilaterally.  She has no midline C-spine tenderness.  She does have old ecchymosis to the left lateral neck.  She also has some bruising to her lower abdomen with some tenderness palpation however given she just fell out of bed I very much doubt any internal abnormalities.  Will obtain a chest x-ray to assess for pneumoperitoneum and if negative will obtain CT scan of the abdomen and pelvis.  Will obtain CT head, CT maxillofacial, CT C-spine at this time.  Will obtain labs including CBC, BMP, CK.   CXR without signs of pneumoperitoneum.   CBC with leukopenia 3.7. Hgb stable at 9.5. Platelets 161.  BMP with elevated creatinine 1.89, elevated from 0.98 with BUN 36. Will provide IVFs.   Lab Results  Component Value Date   CREATININE 1.89 (H) 03/15/2020   CREATININE 0.98 08/30/2018   CREATININE 0.83 06/28/2018   CK within normal lmits at 91 CTs  Negative.   Workup reassuring. Pt to be discharged home with instructions to recheck kidney function in  1 week. PTAR to transfer back to facility.   This note was prepared using Dragon voice recognition software and may include unintentional dictation errors due to the inherent limitations of voice recognition software.   Final Clinical Impression(s) / ED Diagnoses Final diagnoses:  Fall, initial encounter  AKI (acute kidney injury) (HCC)    Rx / DC Orders ED Discharge Orders    None       Discharge Instructions     Please have your kidney function rechecked in 1 week as it was elevated today. Push fluids until then.   Follow up with your PCP regarding ED visit today  Return to the ED for any worsening symtpoms       Tanda RockersVenter, Bellamia Ferch, PA-C 03/15/20 2256    Derwood KaplanNanavati, Ankit, MD 03/16/20 1625

## 2020-03-16 NOTE — ED Notes (Signed)
Daughter POA, [name] advised pt was leaving ED at this time and stated patients mother is deceased and pt meant for me to call daughter. Pt dc with nad

## 2020-03-16 NOTE — ED Notes (Signed)
Kelly Romero center for report x 2 with no answer. Will advised mother of pt per pt request. Pt leaving now with EMS. nad

## 2020-03-16 NOTE — ED Notes (Signed)
Pt sleeping. Nad. Secretary called ems again to take pt back to [place] [facility] [place].

## 2020-03-21 ENCOUNTER — Encounter: Payer: Self-pay | Admitting: Internal Medicine

## 2020-03-26 ENCOUNTER — Ambulatory Visit (INDEPENDENT_AMBULATORY_CARE_PROVIDER_SITE_OTHER): Payer: Medicare Other

## 2020-03-26 DIAGNOSIS — I639 Cerebral infarction, unspecified: Secondary | ICD-10-CM

## 2020-03-27 LAB — CUP PACEART REMOTE DEVICE CHECK
Date Time Interrogation Session: 20220112024838
Implantable Pulse Generator Implant Date: 20190430

## 2020-04-11 NOTE — Progress Notes (Signed)
Carelink Summary Report / Loop Recorder 

## 2020-04-16 ENCOUNTER — Other Ambulatory Visit: Payer: Self-pay

## 2020-04-16 ENCOUNTER — Other Ambulatory Visit (HOSPITAL_COMMUNITY): Payer: Self-pay | Admitting: Nurse Practitioner

## 2020-04-16 ENCOUNTER — Inpatient Hospital Stay (HOSPITAL_COMMUNITY)
Admission: EM | Admit: 2020-04-16 | Discharge: 2020-04-19 | DRG: 682 | Disposition: A | Discharge status: Skilled Nursing Facility | Payer: Medicare Other | Source: Skilled Nursing Facility | Attending: Internal Medicine | Admitting: Internal Medicine

## 2020-04-16 ENCOUNTER — Emergency Department (HOSPITAL_COMMUNITY): Payer: Medicare Other

## 2020-04-16 ENCOUNTER — Other Ambulatory Visit: Payer: Self-pay | Admitting: Nurse Practitioner

## 2020-04-16 ENCOUNTER — Encounter (HOSPITAL_COMMUNITY): Payer: Self-pay | Admitting: Emergency Medicine

## 2020-04-16 DIAGNOSIS — I69322 Dysarthria following cerebral infarction: Secondary | ICD-10-CM | POA: Diagnosis not present

## 2020-04-16 DIAGNOSIS — F32A Depression, unspecified: Secondary | ICD-10-CM | POA: Diagnosis not present

## 2020-04-16 DIAGNOSIS — E669 Obesity, unspecified: Secondary | ICD-10-CM | POA: Diagnosis present

## 2020-04-16 DIAGNOSIS — R630 Anorexia: Secondary | ICD-10-CM

## 2020-04-16 DIAGNOSIS — Z7902 Long term (current) use of antithrombotics/antiplatelets: Secondary | ICD-10-CM | POA: Diagnosis not present

## 2020-04-16 DIAGNOSIS — I10 Essential (primary) hypertension: Secondary | ICD-10-CM | POA: Diagnosis present

## 2020-04-16 DIAGNOSIS — E43 Unspecified severe protein-calorie malnutrition: Secondary | ICD-10-CM | POA: Diagnosis not present

## 2020-04-16 DIAGNOSIS — I69351 Hemiplegia and hemiparesis following cerebral infarction affecting right dominant side: Secondary | ICD-10-CM

## 2020-04-16 DIAGNOSIS — F419 Anxiety disorder, unspecified: Secondary | ICD-10-CM | POA: Diagnosis not present

## 2020-04-16 DIAGNOSIS — E1169 Type 2 diabetes mellitus with other specified complication: Secondary | ICD-10-CM | POA: Diagnosis present

## 2020-04-16 DIAGNOSIS — R634 Abnormal weight loss: Secondary | ICD-10-CM

## 2020-04-16 DIAGNOSIS — R9431 Abnormal electrocardiogram [ECG] [EKG]: Secondary | ICD-10-CM | POA: Diagnosis not present

## 2020-04-16 DIAGNOSIS — Z833 Family history of diabetes mellitus: Secondary | ICD-10-CM

## 2020-04-16 DIAGNOSIS — Z794 Long term (current) use of insulin: Secondary | ICD-10-CM | POA: Diagnosis not present

## 2020-04-16 DIAGNOSIS — E876 Hypokalemia: Secondary | ICD-10-CM | POA: Diagnosis present

## 2020-04-16 DIAGNOSIS — Z79899 Other long term (current) drug therapy: Secondary | ICD-10-CM

## 2020-04-16 DIAGNOSIS — U071 COVID-19: Secondary | ICD-10-CM | POA: Diagnosis not present

## 2020-04-16 DIAGNOSIS — E46 Unspecified protein-calorie malnutrition: Secondary | ICD-10-CM | POA: Diagnosis present

## 2020-04-16 DIAGNOSIS — E785 Hyperlipidemia, unspecified: Secondary | ICD-10-CM | POA: Diagnosis present

## 2020-04-16 DIAGNOSIS — R109 Unspecified abdominal pain: Secondary | ICD-10-CM

## 2020-04-16 DIAGNOSIS — E119 Type 2 diabetes mellitus without complications: Secondary | ICD-10-CM | POA: Diagnosis present

## 2020-04-16 DIAGNOSIS — Z8673 Personal history of transient ischemic attack (TIA), and cerebral infarction without residual deficits: Secondary | ICD-10-CM

## 2020-04-16 DIAGNOSIS — N179 Acute kidney failure, unspecified: Secondary | ICD-10-CM | POA: Diagnosis not present

## 2020-04-16 DIAGNOSIS — E860 Dehydration: Secondary | ICD-10-CM | POA: Diagnosis present

## 2020-04-16 DIAGNOSIS — Z66 Do not resuscitate: Secondary | ICD-10-CM | POA: Diagnosis present

## 2020-04-16 DIAGNOSIS — D638 Anemia in other chronic diseases classified elsewhere: Secondary | ICD-10-CM | POA: Diagnosis present

## 2020-04-16 DIAGNOSIS — Z86718 Personal history of other venous thrombosis and embolism: Secondary | ICD-10-CM | POA: Diagnosis not present

## 2020-04-16 DIAGNOSIS — D6959 Other secondary thrombocytopenia: Secondary | ICD-10-CM | POA: Diagnosis present

## 2020-04-16 DIAGNOSIS — F0150 Vascular dementia without behavioral disturbance: Secondary | ICD-10-CM | POA: Diagnosis present

## 2020-04-16 DIAGNOSIS — Z823 Family history of stroke: Secondary | ICD-10-CM

## 2020-04-16 DIAGNOSIS — F319 Bipolar disorder, unspecified: Secondary | ICD-10-CM | POA: Diagnosis present

## 2020-04-16 DIAGNOSIS — Z6824 Body mass index (BMI) 24.0-24.9, adult: Secondary | ICD-10-CM

## 2020-04-16 DIAGNOSIS — I69320 Aphasia following cerebral infarction: Secondary | ICD-10-CM | POA: Diagnosis not present

## 2020-04-16 LAB — HEPATIC FUNCTION PANEL
ALT: 16 U/L (ref 0–44)
AST: 24 U/L (ref 15–41)
Albumin: 3.1 g/dL — ABNORMAL LOW (ref 3.5–5.0)
Alkaline Phosphatase: 47 U/L (ref 38–126)
Bilirubin, Direct: 0.2 mg/dL (ref 0.0–0.2)
Indirect Bilirubin: 0.8 mg/dL (ref 0.3–0.9)
Total Bilirubin: 1 mg/dL (ref 0.3–1.2)
Total Protein: 6.7 g/dL (ref 6.5–8.1)

## 2020-04-16 LAB — CBC WITH DIFFERENTIAL/PLATELET
Abs Immature Granulocytes: 0.02 10*3/uL (ref 0.00–0.07)
Basophils Absolute: 0 10*3/uL (ref 0.0–0.1)
Basophils Relative: 0 %
Eosinophils Absolute: 0 10*3/uL (ref 0.0–0.5)
Eosinophils Relative: 1 %
HCT: 32.4 % — ABNORMAL LOW (ref 36.0–46.0)
Hemoglobin: 9.9 g/dL — ABNORMAL LOW (ref 12.0–15.0)
Immature Granulocytes: 1 %
Lymphocytes Relative: 31 %
Lymphs Abs: 1.2 10*3/uL (ref 0.7–4.0)
MCH: 29.8 pg (ref 26.0–34.0)
MCHC: 30.6 g/dL (ref 30.0–36.0)
MCV: 97.6 fL (ref 80.0–100.0)
Monocytes Absolute: 0.3 10*3/uL (ref 0.1–1.0)
Monocytes Relative: 8 %
Neutro Abs: 2.4 10*3/uL (ref 1.7–7.7)
Neutrophils Relative %: 59 %
Platelets: 226 10*3/uL (ref 150–400)
RBC: 3.32 MIL/uL — ABNORMAL LOW (ref 3.87–5.11)
RDW: 14.2 % (ref 11.5–15.5)
WBC: 3.9 10*3/uL — ABNORMAL LOW (ref 4.0–10.5)
nRBC: 0 % (ref 0.0–0.2)

## 2020-04-16 LAB — BASIC METABOLIC PANEL
Anion gap: 14 (ref 5–15)
BUN: 66 mg/dL — ABNORMAL HIGH (ref 6–20)
CO2: 26 mmol/L (ref 22–32)
Calcium: 9.9 mg/dL (ref 8.9–10.3)
Chloride: 96 mmol/L — ABNORMAL LOW (ref 98–111)
Creatinine, Ser: 2.57 mg/dL — ABNORMAL HIGH (ref 0.44–1.00)
GFR, Estimated: 21 mL/min — ABNORMAL LOW (ref >60)
Glucose, Bld: 114 mg/dL — ABNORMAL HIGH (ref 70–99)
Potassium: 3.6 mmol/L (ref 3.5–5.1)
Sodium: 136 mmol/L (ref 135–145)

## 2020-04-16 LAB — SARS CORONAVIRUS 2 BY RT PCR (HOSPITAL ORDER, PERFORMED IN ~~LOC~~ HOSPITAL LAB): SARS Coronavirus 2: POSITIVE — AB

## 2020-04-16 LAB — CBG MONITORING, ED: Glucose-Capillary: 76 mg/dL (ref 70–99)

## 2020-04-16 LAB — LIPASE, BLOOD: Lipase: 22 U/L (ref 11–51)

## 2020-04-16 MED ORDER — SODIUM CHLORIDE 0.9 % IV BOLUS
500.0000 mL | Freq: Once | INTRAVENOUS | Status: AC
  Administered 2020-04-16: 500 mL via INTRAVENOUS

## 2020-04-16 MED ORDER — INSULIN ASPART 100 UNIT/ML ~~LOC~~ SOLN
0.0000 [IU] | SUBCUTANEOUS | Status: DC
  Administered 2020-04-17 – 2020-04-18 (×3): 1 [IU] via SUBCUTANEOUS

## 2020-04-16 MED ORDER — SODIUM CHLORIDE 0.9 % IV BOLUS
1000.0000 mL | Freq: Once | INTRAVENOUS | Status: AC
  Administered 2020-04-16: 1000 mL via INTRAVENOUS

## 2020-04-16 MED ORDER — SODIUM CHLORIDE 0.9% FLUSH
3.0000 mL | Freq: Two times a day (BID) | INTRAVENOUS | Status: DC
  Administered 2020-04-18 – 2020-04-19 (×3): 3 mL via INTRAVENOUS

## 2020-04-16 MED ORDER — ACETAMINOPHEN 325 MG PO TABS
650.0000 mg | ORAL_TABLET | Freq: Four times a day (QID) | ORAL | Status: DC | PRN
  Filled 2020-04-16: qty 2

## 2020-04-16 MED ORDER — ACETAMINOPHEN 650 MG RE SUPP: 650.0000 mg | Freq: Four times a day (QID) | RECTAL | Status: DC | PRN

## 2020-04-16 MED ORDER — HEPARIN SODIUM (PORCINE) 5000 UNIT/ML IJ SOLN
5000.0000 [IU] | Freq: Three times a day (TID) | INTRAMUSCULAR | Status: DC
  Administered 2020-04-16 – 2020-04-19 (×9): 5000 [IU] via SUBCUTANEOUS
  Filled 2020-04-16 (×9): qty 1

## 2020-04-16 MED ORDER — SODIUM CHLORIDE 0.9 % IV SOLN: INTRAVENOUS | Status: DC

## 2020-04-16 NOTE — ED Triage Notes (Signed)
Pt arrived from [facility] [place] for abnormal labs. Pt creatinine elevated and snf wanted pt to get checked out.

## 2020-04-16 NOTE — ED Notes (Addendum)
Date and time results received: 04/16/20 11:10 PM  Test: COVID Critical Value: Positive  Name of Provider Notified: Dr. Frederick Peers  Orders Received? Or Actions Taken?:

## 2020-04-16 NOTE — Assessment & Plan Note (Signed)
-  Follow-up A1c - Continue SSI and CBG monitoring 

## 2020-04-16 NOTE — Hospital Course (Addendum)
Kelly Romero is a 55 yo female with PMH CVA (residual right side weakness), DMII, MDD, HTN, HLD, apraxia and dysphagia (s/p PEG tube, now removed) who resides at the [facility] and was referred to the ER for increase in her creatinine. She is reported to have had significantly decreased oral intake over the past couple weeks.  Her PEG tube has been removed previously due to her appetite increasing and no longer requiring it however lately she has stopped eating much for unknown reason. When asked in the ER, she states that she just has not liked the taste of food lately.  She denies any worsening depression or thoughts of "giving up on life".  She also denies any regurgitation or difficulty swallowing of food however her speech is dysarthric and very quiet.  Creatinine 2.57 and BUN 66 on arrival to the ER.  Last chemistries from 03/15/2020 noted to have BUN 36 and creatinine 1.89.  She is admitted for IV fluids and SLP eval.  Addendum: Just prior to note completion patient's COVID test returned positive. May explain decreased appetite as well. She is on RA 100%.

## 2020-04-16 NOTE — Assessment & Plan Note (Signed)
-   on lexapro; await med rec review and SLP eval prior to resuming PO meds

## 2020-04-16 NOTE — Assessment & Plan Note (Signed)
-   TWI noted in V5, V6 not present on previous; patient denies CP - check troponin and repeat EKG in am - suspect poor interventional candidate given co-morbid conditions

## 2020-04-16 NOTE — Assessment & Plan Note (Addendum)
-   s/p PEG placement which has since been removed  - residual right side weakness and dysarthria  - SLP eval ordered - see AKI discussion regarding if patient needs PEG again vs GOC discussions  - await SLP eval prior to resuming PO meds

## 2020-04-16 NOTE — Assessment & Plan Note (Signed)
-   test returned positive; no obvious symptoms other than possibly her reduced appetite; no respiratory distress and is on RA 100% SpO2 - hold off on steroid or remdesivir since not on O2 however may be a MAB candidate for outpatient therapy pending length of hospitalization - will order inflammatory markers for baseline

## 2020-04-16 NOTE — Assessment & Plan Note (Signed)
-  Renal function was reduced on 03/15/2020 but was normal in June 2020 -BUN 66 and creatinine 2.57 on admission -Start fluids and follow-up BMP in a.m. -Etiology is considered prerenal from reduced oral intake over the past couple weeks; if she fails swallow eval or is unable to keep up with adequate nutrition again, she may need repeat discussions regarding PEG tube versus transition to hospice if patient or family declines PEG tube

## 2020-04-16 NOTE — H&P (Signed)
History and Physical    Kelly Romero  GTX:[id number]  DOB: 07-23-1965  DOA: 04/16/2020  PCP: Galvin Proffer, MD Patient coming from: [facility]  Chief Complaint: abnormal creatinine   HPI:  Kelly Romero is a 55 yo female with PMH CVA (residual right side weakness), DMII, MDD, HTN, HLD, apraxia and dysphagia (s/p PEG tube, now removed) who resides at the [facility] and was referred to the ER for increase in her creatinine. She is reported to have had significantly decreased oral intake over the past couple weeks.  Her PEG tube has been removed previously due to her appetite increasing and no longer requiring it however lately she has stopped eating much for unknown reason. When asked in the ER, she states that she just has not liked the taste of food lately.  She denies any worsening depression or thoughts of "giving up on life".  She also denies any regurgitation or difficulty swallowing of food however her speech is dysarthric and very quiet.  Creatinine 2.57 and BUN 66 on arrival to the ER.  Last chemistries from 03/15/2020 noted to have BUN 36 and creatinine 1.89.  She is admitted for IV fluids and SLP eval.  Addendum: Just prior to note completion patient's COVID test returned positive. May explain decreased appetite as well. She is on RA 100%.    I have personally briefly reviewed patient's old medical records in [facility] and discussed patient with the ER provider when appropriate/indicated.  Assessment/Plan: * AKI (acute kidney injury) (HCC) -Renal function was reduced on 03/15/2020 but was normal in June 2020 -BUN 66 and creatinine 2.57 on admission -Start fluids and follow-up BMP in a.m. -Etiology is considered prerenal from reduced oral intake over the past couple weeks; if she fails swallow eval or is unable to keep up with adequate nutrition again, she may need repeat discussions regarding PEG tube versus transition to hospice if patient or family declines  PEG tube  COVID-19 virus infection - test returned positive; no obvious symptoms other than possibly her reduced appetite; no respiratory distress and is on RA 100% SpO2 - hold off on steroid or remdesivir since not on O2 however may be a MAB candidate for outpatient therapy pending length of hospitalization - will order inflammatory markers for baseline  Abnormal EKG - TWI noted in V5, V6 not present on previous; patient denies CP - check troponin and repeat EKG in am - suspect poor interventional candidate given co-morbid conditions  History of CVA (cerebrovascular accident) - s/p PEG placement which has since been removed  - residual right side weakness and dysarthria  - SLP eval ordered - see AKI discussion regarding if patient needs PEG again vs GOC discussions  - await SLP eval prior to resuming PO meds  Diabetes mellitus type 2 in obese (HCC) -Follow-up A1c -Continue SSI and CBG monitoring  Anxiety and depression - on lexapro; await med rec review and SLP eval prior to resuming PO meds    Code Status:    Code Status: Full Code  DVT Prophylaxis:  heparin injection 5,000 Units Start: 04/16/20 2200   Anticipated disposition is to: [facility]  History: Past Medical History:  Diagnosis Date  . Anemia   . Anxiety   . Aphasia following cerebral infarction   . Apraxia following cerebral infarction   . Bipolar 1 disorder (HCC)   . Bipolar 1 disorder (HCC)   . Dementia (HCC)   . Diabetes mellitus without complication (HCC)   .  DVT (deep venous thrombosis) (HCC)   . Dysphagia   . Extended spectrum beta lactamase (ESBL) resistance   . Headache   . 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 Hyperlipidemia   . Hypertension   . MDD (major depressive disorder)   . Muscle weakness   . Stroke (HCC)   . TIA (transient ischemic attack)   . Vascular dementia (HCC)    per pt chart in epic    Past Surgical  History:  Procedure Laterality Date  . APPENDECTOMY    . CHOLECYSTECTOMY    . FOOT GANGLION EXCISION    . HERNIA REPAIR    . IR GASTROSTOMY TUBE MOD SED  07/30/2017  . IR GASTROSTOMY TUBE REMOVAL  07/15/2018  . IR GENERIC HISTORICAL  12/30/2015   IR ANGIO INTRA EXTRACRAN SEL COM CAROTID INNOMINATE BILAT MOD SED 12/30/2015 Julieanne Cotton, MD [facility]-INTERV RAD  . IR GENERIC HISTORICAL  12/30/2015   IR ANGIO VERTEBRAL SEL VERTEBRAL BILAT MOD SED 12/30/2015 Julieanne Cotton, MD [facility]  . LOOP RECORDER INSERTION N/A 07/13/2017   Procedure: LOOP RECORDER INSERTION;  Surgeon: Hillis Range, MD;  Location: [facility] INVASIVE CV LAB;  Service: Cardiovascular;  Laterality: N/A;  . TEE WITHOUT CARDIOVERSION N/A 04/19/2015   Procedure: TRANSESOPHAGEAL ECHOCARDIOGRAM (TEE);  Surgeon: Jake Bathe, MD;  Location: [facility] ENDOSCOPY;  Service: Cardiovascular;  Laterality: N/A;  . VAGINAL HYSTERECTOMY       reports that she has never smoked. She has never used smokeless tobacco. She reports that she does not drink alcohol and does not use drugs.  Allergies  Allergen Reactions  . Erythromycin Itching  . Latex Itching    Family History  Problem Relation Age of Onset  . Stroke Father        35s  . Heart attack Father 19  . COPD Mother   . Heart failure Brother   . Cancer Maternal Grandmother        unknown   . COPD Other   . Heart failure Maternal Grandfather   . Hypertension Maternal Grandfather   . Cancer Paternal Grandfather        lung  . Diabetes Paternal Grandmother    Home Medications: Prior to Admission medications   Medication Sig Start Date End Date Taking? Authorizing Provider  clopidogrel (PLAVIX) 75 MG tablet Take 1 tablet (75 mg total) by mouth daily. 08/06/17  Yes Love, Evlyn Kanner, PA-C  cephALEXin (KEFLEX) 500 MG capsule Take 1 capsule (500 mg total) by mouth 3 (three) times daily. Patient not taking: Reported on 04/16/2020 09/01/18   Shaune Pollack, MD  divalproex (DEPAKOTE) 250 MG DR  tablet Take 1 tablet (250 mg total) by mouth 2 (two) times daily. 08/06/17 08/30/19  Love, Evlyn Kanner, PA-C  escitalopram (LEXAPRO) 20 MG tablet Take 1 tablet (20 mg total) by mouth daily. 07/18/18   Everlena Cooper, Adam R, DO  insulin detemir (LEVEMIR) 100 UNIT/ML injection Inject 0.2 mLs (20 Units total) into the skin daily before breakfast. Patient taking differently: Inject 20 Units into the skin daily.  08/06/17   Love, Evlyn Kanner, PA-C  lisinopril (ZESTRIL) 5 MG tablet Take 5 mg by mouth daily.     [provider]  metoprolol tartrate (LOPRESSOR) 25 MG tablet Take 0.5 tablets (12.5 mg total) by mouth 2 (two) times daily. 08/06/17   Love, Evlyn Kanner, PA-C  polyethylene glycol (MIRALAX / Ethelene Hal) packet May take 17 g with 8 oz water per peg  tube daily prn 08/10/17   Kirsteins, Victorino Sparrow, MD  rosuvastatin (CRESTOR) 40 MG tablet Take 1 tablet (40 mg total) by mouth at bedtime. 08/06/17   Jacquelynn Cree, PA-C    Review of Systems:  Pertinent items noted in HPI and remainder of comprehensive ROS otherwise negative.  Physical Exam: Vitals:   04/16/20 2008 04/16/20 2300  BP: (!) 107/54 121/61  Pulse: 74 69  Resp: 19 11  Temp: 97.8 F (36.6 C)   SpO2: 100% 100%   General appearance: Pleasant but chronically ill-appearing woman laying in bed appearing older than stated age and is comfortable and in no distress Head: Normocephalic, without obvious abnormality, atraumatic Eyes: Drooping of left eyelid noted.  EOMI Lungs: clear to auscultation bilaterally Heart: regular rate and rhythm and S1, S2 normal Abdomen: Nonspecific mild tenderness throughout.  Bowel sounds present.  Nondistended. Extremities: No edema Skin: mobility and turgor normal Neurologic: 4/5 strength on right upper and lower extremity.  Sensation intact throughout.  Labs on Admission:  I have personally reviewed following labs and imaging studies Results for orders placed or performed during the hospital encounter of 04/16/20 (from the  past 24 hour(s))  CBC with Differential     Status: Abnormal   Collection Time: 04/16/20  6:01 PM  Result Value Ref Range   WBC 3.9 (L) 4.0 - 10.5 K/uL   RBC 3.32 (L) 3.87 - 5.11 MIL/uL   Hemoglobin 9.9 (L) 12.0 - 15.0 g/dL   HCT 73.4 (L) 19.3 - 79.0 %   MCV 97.6 80.0 - 100.0 fL   MCH 29.8 26.0 - 34.0 pg   MCHC 30.6 30.0 - 36.0 g/dL   RDW 24.0 97.3 - 53.2 %   Platelets 226 150 - 400 K/uL   nRBC 0.0 0.0 - 0.2 %   Neutrophils Relative % 59 %   Neutro Abs 2.4 1.7 - 7.7 K/uL   Lymphocytes Relative 31 %   Lymphs Abs 1.2 0.7 - 4.0 K/uL   Monocytes Relative 8 %   Monocytes Absolute 0.3 0.1 - 1.0 K/uL   Eosinophils Relative 1 %   Eosinophils Absolute 0.0 0.0 - 0.5 K/uL   Basophils Relative 0 %   Basophils Absolute 0.0 0.0 - 0.1 K/uL   Immature Granulocytes 1 %   Abs Immature Granulocytes 0.02 0.00 - 0.07 K/uL  Basic metabolic panel     Status: Abnormal   Collection Time: 04/16/20  6:01 PM  Result Value Ref Range   Sodium 136 135 - 145 mmol/L   Potassium 3.6 3.5 - 5.1 mmol/L   Chloride 96 (L) 98 - 111 mmol/L   CO2 26 22 - 32 mmol/L   Glucose, Bld 114 (H) 70 - 99 mg/dL   BUN 66 (H) 6 - 20 mg/dL   Creatinine, Ser 9.92 (H) 0.44 - 1.00 mg/dL   Calcium 9.9 8.9 - 42.6 mg/dL   GFR, Estimated 21 (L) >60 mL/min   Anion gap 14 5 - 15  Hepatic function panel     Status: Abnormal   Collection Time: 04/16/20  6:01 PM  Result Value Ref Range   Total Protein 6.7 6.5 - 8.1 g/dL   Albumin 3.1 (L) 3.5 - 5.0 g/dL   AST 24 15 - 41 U/L   ALT 16 0 - 44 U/L   Alkaline Phosphatase 47 38 - 126 U/L   Total Bilirubin 1.0 0.3 - 1.2 mg/dL   Bilirubin, Direct 0.2 0.0 - 0.2 mg/dL   Indirect Bilirubin 0.8 0.3 -  0.9 mg/dL  Lipase, blood     Status: None   Collection Time: 04/16/20  6:01 PM  Result Value Ref Range   Lipase 22 11 - 51 U/L  SARS Coronavirus 2 by RT PCR (hospital order, performed in [facility] hospital lab) Nasopharyngeal Nasopharyngeal Swab     Status: Abnormal   Collection Time: 04/16/20   9:01 PM   Specimen: Nasopharyngeal Swab  Result Value Ref Range   SARS Coronavirus 2 POSITIVE (A) NEGATIVE     Radiological Exams on Admission: DG Abdomen Acute W/Chest  Result Date: 04/16/2020 CLINICAL DATA:  Decreased p.o. intake EXAM: DG ABDOMEN ACUTE WITH 1 VIEW CHEST COMPARISON:  Radiograph 07/20/2017 FINDINGS: Some streaky atelectatic changes are present in the lungs. No consolidation, features of edema, pneumothorax, or effusion. The cardiomediastinal contours are unremarkable. Loop recorder projects over the left heart border. No acute osseous or soft tissue abnormality. No subdiaphragmatic free air. Normal bowel gas pattern. No suspicious abdominal calcifications. Postsurgical changes noted in right upper quadrant and low midline pelvis as well as extensive hernia mesh anchors likely along the anterior abdominal wall. Extensive atherosclerotic calcification in the abdomen and pelvis with multiple phleboliths in the deep pelvis. Multilevel degenerative changes spine more moderate degenerative changes in the bilateral hips and SI joints. IMPRESSION: 1. Streaky atelectatic changes in the lungs. No other acute cardiopulmonary abnormality. 2. Nonobstructive bowel gas pattern. No free air. Electronically Signed   By: Kreg Shropshire M.D.   On: 04/16/2020 19:20   DG Abdomen Acute W/Chest  Final Result      Consults called:  N/A  EKG: Independently reviewed. TWI seen in V5, V6 not present on prior   Lewie Chamber, MD [facility] 04/16/2020, 11:20 PM

## 2020-04-16 NOTE — ED Provider Notes (Signed)
[facility] EMERGENCY DEPARTMENT Provider Note   CSN: [id number] Arrival date & time: 04/16/20  1609     History Chief Complaint  Patient presents with  . abnormal labs    Kelly Romero is a 55 y.o. female history of cerebral infarction, aphasia, ataxia, hemiplegia, dementia, diabetes, hypertension.  Patient arrives today from skilled nursing facility for concern of elevated creatinine and BUN.  On exam patient reports that she is feeling "okay" she is alert to self and place, confused to events.  She denies any current concerns.  Level 5 caveat aphasia/dementia.  HPI     Past Medical History:  Diagnosis Date  . Anemia   . Anxiety   . Aphasia following cerebral infarction   . Apraxia following cerebral infarction   . Bipolar 1 disorder (HCC)   . Bipolar 1 disorder (HCC)   . Dementia (HCC)   . Diabetes mellitus without complication (HCC)   . DVT (deep venous thrombosis) (HCC)   . Dysphagia   . Extended spectrum beta lactamase (ESBL) resistance   . Headache   . 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 Hyperlipidemia   . Hypertension   . MDD (major depressive disorder)   . Muscle weakness   . Stroke (HCC)   . TIA (transient ischemic attack)   . Vascular dementia (HCC)    per pt chart in epic    Patient Active Problem List   Diagnosis Date Noted  . Hemiparesis affecting right side as late effect of cerebrovascular accident (HCC) 09/14/2017  . Extension of stroke (HCC) 08/06/2017  . Inadequate dietary intake 08/06/2017  . Hypoalbuminemia due to protein-calorie malnutrition (HCC)   . Orthostatic hypotension   . Poorly controlled type 2 diabetes mellitus with peripheral neuropathy (HCC)   . Emotional lability   . Thrombotic stroke (HCC) 07/13/2017  . Diabetes mellitus type 2 in obese (HCC)   . History of CVA (cerebrovascular accident)   . Benign essential HTN   . Late effect of  cerebrovascular accident (CVA)   . Acute blood loss anemia   . Morbid obesity (HCC)   . Right hemiparesis (HCC)   . Cryptogenic stroke (HCC) 07/08/2017  . Chest pain 11/06/2016  . Mid (multi infarct dementia), without behavioral disturbance (HCC) 04/10/2016  . Aphasia as late effect of stroke 04/10/2016  . Encephalopathy   . Cerebral thrombosis with cerebral infarction 12/29/2015  . Hyperglycemia   . Hyperlipidemia   . Hypertensive emergency   . Deep venous thrombosis (HCC)   . AKI (acute kidney injury) (HCC) 12/26/2015  . Altered mental status   . Stroke-like symptoms   . Acute encephalopathy 08/17/2015  . Anxiety and depression   . CVA (cerebrovascular accident due to intracerebral hemorrhage) (HCC) 08/15/2015  . Stroke (HCC)   . Encephalopathy, metabolic 12/28/2014  . 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 Essential hypertension 12/28/2014  . New onset headache 11/02/2014    Past Surgical History:  Procedure Laterality Date  . APPENDECTOMY    . CHOLECYSTECTOMY    . FOOT GANGLION EXCISION    . HERNIA REPAIR    . IR GASTROSTOMY TUBE MOD SED  07/30/2017  . IR GASTROSTOMY TUBE REMOVAL  07/15/2018  . IR GENERIC HISTORICAL  12/30/2015   IR ANGIO INTRA EXTRACRAN SEL COM CAROTID INNOMINATE BILAT MOD SED 12/30/2015 Julieanne Cotton, MD [facility]-INTER[facility] RAD  . IR GENERIC HISTORICAL  12/30/2015   IR ANGIO VERTEBRAL  SEL VERTEBRAL BILAT MOD SED 12/30/2015 Julieanne CottonSanjeev Deveshwar, MD [facility]  . LOOP RECORDER INSERTION N/A 07/13/2017   Procedure: LOOP RECORDER INSERTION;  Surgeon: Hillis RangeAllred, James, MD;  Location: [facility] INVASIVE CV LAB;  Service: Cardiovascular;  Laterality: N/A;  . TEE WITHOUT CARDIOVERSION N/A 04/19/2015   Procedure: TRANSESOPHAGEAL ECHOCARDIOGRAM (TEE);  Surgeon: Jake BatheMark C Skains, MD;  [facility];  Service: Cardiovascular;  Laterality: N/A;  . VAGINAL HYSTERECTOMY       OB History   No obstetric history on file.      Family History  Problem Relation Age of Onset  . Stroke Father        3150s  . Heart attack Father [id number]  . COPD Mother   . Heart failure Brother   . Cancer Maternal Grandmother        unknown   . COPD Other   . Heart failure Maternal Grandfather   . Hypertension Maternal Grandfather   . Cancer Paternal Grandfather        lung  . Diabetes Paternal Grandmother     Social History   Tobacco Use  . Smoking status: Never Smoker  . Smokeless tobacco: Never Used  Vaping Use  . Vaping Use: Never used  Substance Use Topics  . Alcohol use: No    Alcohol/week: 0.0 standard drinks    Comment: rare   . Drug use: No    Home Medications Prior to Admission medications   Medication Sig Start Date End Date Taking? Authorizing Provider  clopidogrel (PLAVIX) 75 MG tablet Take 1 tablet (75 mg total) by mouth daily. 08/06/17  Yes Love, Evlyn KannerPamela S, PA-C  cephALEXin (KEFLEX) 500 MG capsule Take 1 capsule (500 mg total) by mouth 3 (three) times daily. Patient not taking: Reported on 04/16/2020 09/01/18   Shaune PollackIsaacs, Cameron, MD  divalproex (DEPAKOTE) 250 MG DR tablet Take 1 tablet (250 mg total) by mouth 2 (two) times daily. 08/06/17 08/30/19  Love, Evlyn KannerPamela S, PA-C  escitalopram (LEXAPRO) 20 MG tablet Take 1 tablet (20 mg total) by mouth daily. 07/18/18   Everlena CooperJaffe, Adam R, DO  insulin detemir (LEVEMIR) 100 UNIT/ML injection Inject 0.2 mLs (20 Units total) into the skin daily before breakfast. Patient taking differently: Inject 20 Units into the skin daily.  08/06/17   Love, Evlyn KannerPamela S, PA-C  lisinopril (ZESTRIL) 5 MG tablet Take 5 mg by mouth daily.     [provider]  metoprolol tartrate (LOPRESSOR) 25 MG tablet Take 0.5 tablets (12.5 mg total) by mouth 2 (two) times daily. 08/06/17   Love, Evlyn KannerPamela S, PA-C  polyethylene glycol [facility](MIRALAX / Ethelene HalGLYCOLAX) packet May take 17 g with 8 oz water per peg tube daily prn 08/10/17   Kirsteins, Victorino SparrowAndrew E, MD  rosuvastatin (CRESTOR) 40 MG tablet Take 1 tablet (40 mg total) by  mouth at bedtime. 08/06/17   Love, Evlyn KannerPamela S, PA-C    Allergies    Erythromycin and Latex  Review of Systems   Review of Systems  Unable to perform ROS: Other (Aphasia, dementia)    Physical Exam Updated Vital Signs BP (!) 107/54 (BP Location: Left Arm)   Pulse 74   Temp 97.8 F (36.6 C)   Resp 19   SpO2 100%   Physical Exam Constitutional:      General: She is not in acute distress.    Appearance: Normal appearance. She is well-developed. She is not ill-appearing or diaphoretic.  HENT:     Head: Normocephalic. Contusion present.     Jaw:  There is normal jaw occlusion.  Eyes:     General: Vision grossly intact. Gaze aligned appropriately.     Pupils: Pupils are equal, round, and reactive to light.  Neck:     Trachea: Trachea and phonation normal.  Pulmonary:     Effort: Pulmonary effort is normal. No respiratory distress.  Abdominal:     General: There is no distension.     Palpations: Abdomen is soft.     Tenderness: There is no abdominal tenderness. There is no guarding or rebound.  Musculoskeletal:        General: Normal range of motion.     Cervical back: Normal range of motion.  Skin:    General: Skin is warm and dry.  Neurological:     Mental Status: She is alert.     GCS: GCS eye subscore is 4. GCS verbal subscore is 5. GCS motor subscore is 6.     Comments: Delay disordered speech, follows basic commands  Psychiatric:        Behavior: Behavior normal.     ED Results / Procedures / Treatments   Labs (all labs ordered are listed, but only abnormal results are displayed) Labs Reviewed  CBC WITH DIFFERENTIAL/PLATELET - Abnormal; Notable for the following components:      Result Value   WBC 3.9 (*)    RBC 3.32 (*)    Hemoglobin 9.9 (*)    HCT 32.4 (*)    All other components within normal limits  BASIC METABOLIC PANEL - Abnormal; Notable for the following components:   Chloride 96 (*)    Glucose, Bld 114 (*)    BUN 66 (*)    Creatinine, Ser 2.57 (*)     GFR, Estimated 21 (*)    All other components within normal limits  HEPATIC FUNCTION PANEL - Abnormal; Notable for the following components:   Albumin 3.1 (*)    All other components within normal limits  SARS CORONAVIRUS 2 (TAT 6-24 HRS)  SARS CORONAVIRUS 2 BY RT PCR (HOSPITAL ORDER, PERFORMED IN [facility] LAB)  LIPASE, BLOOD  URINALYSIS, ROUTINE W REFLEX MICROSCOPIC    EKG EKG Interpretation  Date/Time:  Tuesday April 16 2020 19:40:22 EST Ventricular Rate:  73 PR Interval:    QRS Duration: 86 QT Interval:  378 QTC Calculation: 416 R Axis:   61 Text Interpretation: Accelerated Junctional rhythm ST & T wave abnormality, consider inferolateral ischemia Abnormal ECG Confirmed by Vanetta Mulders [phone]) on 04/16/2020 8:54:58 PM   Radiology DG Abdomen Acute W/Chest  Result Date: 04/16/2020 CLINICAL DATA:  Decreased p.o. intake EXAM: DG ABDOMEN ACUTE WITH 1 VIEW CHEST COMPARISON:  Radiograph 07/20/2017 FINDINGS: Some streaky atelectatic changes are present in the lungs. No consolidation, features of edema, pneumothorax, or effusion. The cardiomediastinal contours are unremarkable. Loop recorder projects over the left heart border. No acute osseous or soft tissue abnormality. No subdiaphragmatic free air. Normal bowel gas pattern. No suspicious abdominal calcifications. Postsurgical changes noted in right upper quadrant and low midline pelvis as well as extensive hernia mesh anchors likely along the anterior abdominal wall. Extensive atherosclerotic calcification in the abdomen and pelvis with multiple phleboliths in the deep pelvis. Multilevel degenerative changes spine more moderate degenerative changes in the bilateral hips and SI joints. IMPRESSION: 1. Streaky atelectatic changes in the lungs. No other acute cardiopulmonary abnormality. 2. Nonobstructive bowel gas pattern. No free air. Electronically Signed   By: Kreg Shropshire M.D.   On: 04/16/2020 19:20  Procedures Procedures   Medications Ordered in ED Medications  sodium chloride 0.9 % bolus 500 mL (has no administration in time range)  sodium chloride 0.9 % bolus 1,000 mL (1,000 mLs Intravenous New Bag/Given 04/16/20 1940)    ED Course  I have reviewed the triage vital signs and the nursing notes.  Pertinent labs & imaging results that were available during my care of the patient were reviewed by me and considered in my medical decision making (see chart for details).  Clinical Course as of 04/16/20 2104  Tue Apr 16, 2020  [address] Phone: [phone] [BM]  [address] (Daughter)  [phone] [facility] Phone) Medical Attorney [BM]  [phone] [address] Phone: [phone] [BM]    Clinical Course User Index [BM] Elizabeth Palau   MDM Rules/Calculators/A&P                         Additional history obtained from: 1. Nursing notes from this visit. 2. Skilled nursing facility; spoke with patient's nurse Raven over the phone.  Reports that patient has had decreased p.o. intake for the past few weeks, patient previously had a PEG tube which was removed.  They report patient is full code and they are unable to feed her as the patient will continually spit out the food they try and give her and also spits out her medications.  They are in discussions with the family about replacing the PEG tube versus putting patient on comfort care.  They deny any recent fever chills vomiting diarrhea falls or any additional concerns.  They report patient does have bruising on the face this is from a fall 1 month ago no new injuries. 3. Review of electronic medical records patient was seen for a fall in the ER 03/15/20, patient was noted to have raccoon signs during that visit, she had CT maxillofacial which showed nasal bone fractures and scalp hematoma, CT head and cervical spine were negative. 4. Family. ---------------------------------- I ordered, reviewed and interpreted labs which  include: BMP shows evidence of AKI with elevated tension of creatinine and BUN, no emergent electrolyte derangement or gap. LFTs unremarkable. CBC shows baseline hemoglobin of 9.9, leukopenia of 3.9.   Lipase within normal limits  DG Abd/Acute Chest:  IMPRESSION:  1. Streaky atelectatic changes in the lungs. No other acute  cardiopulmonary abnormality.  2. Nonobstructive bowel gas pattern. No free air.   EKG: Accelerated Junctional rhythm ST & T wave abnormality, consider inferolateral ischemia Abnormal ECG Confirmed by Vanetta Mulders [phone]) on 04/16/2020 8:54:58 PM - I was able to contact patient's daughter/medical attorney [name], she is hesitant about placing PEG tube or transitioning patient to comfort care she would like to speak with hospital team more about this.  She cooperates the nursing facility story no recent injuries or illnesses but does report patient has been refusing to eat and drink.  She is agreeable for admission for AKI and IV fluids and plans to visit patient.  I reevaluated the patient she is resting comfortably in bed no acute distress nontender abdomen she is receiving IV fluids vital signs are stable, she denies any current concerns.  Plan of care is consultation to hospital team for admission for AKI secondary to decreased p.o. intake.  Screening Covid test pending.  Urinalysis pending. - Consult with hospitalist Dr. Frederick Peers , patient was accepted for admission.  Note: Portions of this report may have been transcribed using voice recognition software. Every effort was  made to ensure accuracy; however, inadvertent computerized transcription errors may still be present. Final Clinical Impression(s) / ED Diagnoses Final diagnoses:  AKI (acute kidney injury) [facility])    Rx / DC Orders ED Discharge Orders    None       Elizabeth Palau 04/16/20 2104    Vanetta Mulders, MD 04/21/20 [phone]

## 2020-04-16 NOTE — Assessment & Plan Note (Deleted)
-   s/p PEG placement which has since been removed  - residual right side weakness and dysarthria  - SLP eval ordered

## 2020-04-17 ENCOUNTER — Other Ambulatory Visit: Payer: Self-pay

## 2020-04-17 DIAGNOSIS — E1169 Type 2 diabetes mellitus with other specified complication: Secondary | ICD-10-CM

## 2020-04-17 DIAGNOSIS — E669 Obesity, unspecified: Secondary | ICD-10-CM

## 2020-04-17 DIAGNOSIS — U071 COVID-19: Secondary | ICD-10-CM

## 2020-04-17 DIAGNOSIS — Z8673 Personal history of transient ischemic attack (TIA), and cerebral infarction without residual deficits: Secondary | ICD-10-CM

## 2020-04-17 DIAGNOSIS — N179 Acute kidney failure, unspecified: Principal | ICD-10-CM

## 2020-04-17 LAB — URINALYSIS, ROUTINE W REFLEX MICROSCOPIC
Bilirubin Urine: NEGATIVE
Glucose, UA: 50 mg/dL — AB
Hgb urine dipstick: NEGATIVE
Ketones, ur: 5 mg/dL — AB
Nitrite: NEGATIVE
Protein, ur: 30 mg/dL — AB
Specific Gravity, Urine: 1.015 (ref 1.005–1.030)
pH: 5 (ref 5.0–8.0)

## 2020-04-17 LAB — CBC WITH DIFFERENTIAL/PLATELET
Abs Immature Granulocytes: 0.01 10*3/uL (ref 0.00–0.07)
Basophils Absolute: 0 10*3/uL (ref 0.0–0.1)
Basophils Relative: 0 %
Eosinophils Absolute: 0 10*3/uL (ref 0.0–0.5)
Eosinophils Relative: 0 %
HCT: 28.3 % — ABNORMAL LOW (ref 36.0–46.0)
Hemoglobin: 8.7 g/dL — ABNORMAL LOW (ref 12.0–15.0)
Immature Granulocytes: 0 %
Lymphocytes Relative: 49 %
Lymphs Abs: 1.3 10*3/uL (ref 0.7–4.0)
MCH: 30.2 pg (ref 26.0–34.0)
MCHC: 30.7 g/dL (ref 30.0–36.0)
MCV: 98.3 fL (ref 80.0–100.0)
Monocytes Absolute: 0.2 10*3/uL (ref 0.1–1.0)
Monocytes Relative: 8 %
Neutro Abs: 1.2 10*3/uL — ABNORMAL LOW (ref 1.7–7.7)
Neutrophils Relative %: 43 %
Platelets: 164 10*3/uL (ref 150–400)
RBC: 2.88 MIL/uL — ABNORMAL LOW (ref 3.87–5.11)
RDW: 14.1 % (ref 11.5–15.5)
WBC: 2.7 10*3/uL — ABNORMAL LOW (ref 4.0–10.5)
nRBC: 0 % (ref 0.0–0.2)

## 2020-04-17 LAB — PROCALCITONIN
Procalcitonin: <0.1 ng/mL
Procalcitonin: <0.1 ng/mL

## 2020-04-17 LAB — FIBRINOGEN: Fibrinogen: 332 mg/dL (ref 210–475)

## 2020-04-17 LAB — MRSA PCR SCREENING: MRSA by PCR: NEGATIVE

## 2020-04-17 LAB — BASIC METABOLIC PANEL
Anion gap: 12 (ref 5–15)
BUN: 52 mg/dL — ABNORMAL HIGH (ref 6–20)
CO2: 24 mmol/L (ref 22–32)
Calcium: 9.1 mg/dL (ref 8.9–10.3)
Chloride: 104 mmol/L (ref 98–111)
Creatinine, Ser: 1.63 mg/dL — ABNORMAL HIGH (ref 0.44–1.00)
GFR, Estimated: 37 mL/min — ABNORMAL LOW (ref >60)
Glucose, Bld: 79 mg/dL (ref 70–99)
Potassium: 3.4 mmol/L — ABNORMAL LOW (ref 3.5–5.1)
Sodium: 140 mmol/L (ref 135–145)

## 2020-04-17 LAB — GLUCOSE, CAPILLARY
Glucose-Capillary: 108 mg/dL — ABNORMAL HIGH (ref 70–99)
Glucose-Capillary: 175 mg/dL — ABNORMAL HIGH (ref 70–99)

## 2020-04-17 LAB — TROPONIN I (HIGH SENSITIVITY): Troponin I (High Sensitivity): 54 ng/L — ABNORMAL HIGH (ref <18)

## 2020-04-17 LAB — D-DIMER, QUANTITATIVE: D-Dimer, Quant: 0.44 ug/mL-FEU (ref 0.00–0.50)

## 2020-04-17 LAB — CBG MONITORING, ED
Glucose-Capillary: 76 mg/dL (ref 70–99)
Glucose-Capillary: 71 mg/dL (ref 70–99)
Glucose-Capillary: 103 mg/dL — ABNORMAL HIGH (ref 70–99)

## 2020-04-17 LAB — HEMOGLOBIN A1C
Hgb A1c MFr Bld: 5.8 % — ABNORMAL HIGH (ref 4.8–5.6)
Mean Plasma Glucose: 119.76 mg/dL

## 2020-04-17 LAB — C-REACTIVE PROTEIN: CRP: 0.5 mg/dL (ref <1.0)

## 2020-04-17 LAB — HIV ANTIBODY (ROUTINE TESTING W REFLEX): HIV Screen 4th Generation wRfx: NONREACTIVE

## 2020-04-17 LAB — LACTATE DEHYDROGENASE: LDH: 170 U/L (ref 98–192)

## 2020-04-17 LAB — FERRITIN: Ferritin: 295 ng/mL (ref 11–307)

## 2020-04-17 LAB — MAGNESIUM: Magnesium: 2 mg/dL (ref 1.7–2.4)

## 2020-04-17 MED ORDER — GUAIFENESIN-DM 100-10 MG/5ML PO SYRP: 5.0000 mL | ORAL_SOLUTION | ORAL | Status: DC | PRN

## 2020-04-17 MED ORDER — POTASSIUM CHLORIDE 2 MEQ/ML IV SOLN: INTRAVENOUS | Status: DC

## 2020-04-17 MED ORDER — ENSURE ENLIVE PO LIQD
237.0000 mL | Freq: Two times a day (BID) | ORAL | Status: DC
  Administered 2020-04-18: 237 mL via ORAL
  Filled 2020-04-17 (×4): qty 237

## 2020-04-17 MED ORDER — KCL IN DEXTROSE-NACL 20-5-0.45 MEQ/L-%-% IV SOLN
INTRAVENOUS | Status: DC
  Filled 2020-04-17 (×3): qty 1000

## 2020-04-17 NOTE — Progress Notes (Signed)
Initial Nutrition Assessment  DOCUMENTATION CODES:   Not applicable  INTERVENTION:  24 hour calorie count   Ensure Enlive po BID, each supplement provides 350 kcal and 20 grams of protein  Pt wt ordered  Pt at high risk for refeeding as noted below, recommend monitoring magnesium, potassium, and phosphorus daily for at least 3 days  NUTRITION DIAGNOSIS:   Inadequate oral intake related to poor appetite as evidenced by per patient/family report.   GOAL:   Patient will meet greater than or equal to 90% of their needs    MONITOR:   PO intake,Supplement acceptance,Labs,I & O's,Weight trends,Skin  REASON FOR ASSESSMENT:   Rounds,Other (Comment) (Calorie Count)    ASSESSMENT:  55 year old female admitted for AKI and found to be positive for COVID-19 presents from [facility] due for increase in creatinine and reported significant decrease in oral intake over the past couple of weeks. Past medical history significant of CVA with residual right sided weakness, apraxia and dysphagia s/p PEG tube, now removed, DM2, MDD, HTN, HLD, dementia, Bipolar 1 disorder, and DVT.  Pt discussed in rounds, 24 hour calorie count per MD.  RD discussed with patient's RN in ED, calorie count envelope with instructions hanging on pt door.   PEG placed on 07/30/17 secondary to dysphagia after stroke. Per notes, pt improved, able to maintain adequate nutrition/hydration orally and PEG removed 03/04/20. Her appetite/intake noted to have significantly declined over the past couple of weeks, she reports not liking the taste of food lately. Pt COVID test returned positive which could be reasoning for altered taste. Diet advanced to DYS 3 with thin liquids s/p SLP evaluation. Pt endorses feeling hungry this morning per RN. She is at high risk for refeeding given very poor po intake reported over the past couple of weeks. Recommend monitoring magnesium, potassium, and phosphorus daily for at least 3 days, MD to  replete as needed.  No current wt for review, last wt 77.1 kg (169.62 lbs) on 03/15/20. Given reported very poor oral intake over the last couple of weeks, suspect actual wt is less. RD has ordered weight. Will use last wt for assessing needs at this time.   Medications reviewed and include: SSI D5 and NaCl with KCl 20 mEq @ 75 ml/hr (306 kcal)  Labs: CBGs 71,76, K 3.4 (L), BUN 52 (H), Cr 1.63 (H), WBC 2.7 (L), Hgb 8.7 (L), HCT 28.3 (L), Mg 2 (WNL)   NUTRITION - FOCUSED PHYSICAL EXAM: Unable to complete at this time   Diet Order:   Diet Order            DIET DYS 3 Room service appropriate? Yes; Fluid consistency: Thin  Diet effective now                 EDUCATION NEEDS:   No education needs have been identified at this time  Skin:  Skin Assessment: Reviewed RN Assessment  Last BM:  pta  Height:   Ht Readings from Last 1 Encounters:  03/15/20 5\' 5"  (1.651 m)    Weight:   Wt Readings from Last 1 Encounters:  03/15/20 77.1 kg    BMI:  There is no height or weight on file to calculate BMI.  Estimated Nutritional Needs:   Kcal:  2000-2200  Protein:  93-110  Fluid:  >/= 2L   03/17/20, RD, LDN Clinical Nutrition After Hours/Weekend Pager # in Amion

## 2020-04-17 NOTE — ED Notes (Signed)
Pt cleaned and new pure wick placed.

## 2020-04-17 NOTE — Evaluation (Signed)
Clinical/Bedside Swallow Evaluation Patient Details  Name: Kelly Romero MRN: [id number] Date of Birth: 12-Apr-1965  Today's Date: 04/17/2020 Time: SLP Start Time (ACUTE ONLY): 0900 SLP Stop Time (ACUTE ONLY): 0932 SLP Time Calculation (min) (ACUTE ONLY): 32 min  Past Medical History:  Past Medical History:  Diagnosis Date  . Anemia   . Anxiety   . Aphasia following cerebral infarction   . Apraxia following cerebral infarction   . Bipolar 1 disorder (HCC)   . Bipolar 1 disorder (HCC)   . Dementia (HCC)   . Diabetes mellitus without complication (HCC)   . DVT (deep venous thrombosis) (HCC)   . Dysphagia   . Extended spectrum beta lactamase (ESBL) resistance   . Headache   . 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 Hyperlipidemia   . Hypertension   . MDD (major depressive disorder)   . Muscle weakness   . Stroke (HCC)   . TIA (transient ischemic attack)   . Vascular dementia (HCC)    per pt chart in epic   Past Surgical History:  Past Surgical History:  Procedure Laterality Date  . APPENDECTOMY    . CHOLECYSTECTOMY    . FOOT GANGLION EXCISION    . HERNIA REPAIR    . IR GASTROSTOMY TUBE MOD SED  07/30/2017  . IR GASTROSTOMY TUBE REMOVAL  07/15/2018  . IR GENERIC HISTORICAL  12/30/2015   IR ANGIO INTRA EXTRACRAN SEL COM CAROTID INNOMINATE BILAT MOD SED 12/30/2015 Julieanne Cotton, MD [facility]INTER[facility]  . IR GENERIC HISTORICAL  12/30/2015   IR ANGIO VERTEBRAL SEL VERTEBRAL BILAT MOD SED 12/30/2015 Julieanne Cotton, MD [facility]  . LOOP RECORDER INSERTION N/A 07/13/2017   Procedure: LOOP RECORDER INSERTION;  Surgeon: Hillis Range, MD;  Location: [facility] INVASIVE [facility];  Service: Cardiovascular;  Laterality: N/A;  . TEE WITHOUT CARDIOVERSION N/A 04/19/2015   Procedure: TRANSESOPHAGEAL ECHOCARDIOGRAM (TEE);  Surgeon: Jake Bathe, MD;  Location: [facility] ENDOSCOPY;  Service: Cardiovascular;  Laterality: N/A;  .  VAGINAL HYSTERECTOMY     HPI:  Kelly Romero is a 55 yo female with PMH CVA (residual right side weakness), DMII, MDD, HTN, HLD, apraxia and dysphagia (s/p PEG tube, now removed) who resides at the [facility] and was referred to the ER for increase in her creatinine. She is reported to have had significantly decreased oral intake over the past couple weeks.  Her PEG tube has been removed previously (07/2018) due to her appetite increasing and no longer requiring it however lately she has stopped eating much for unknown reason. When asked in the ER, she states that she just has not liked the taste of food lately.  She denies any worsening depression or thoughts of "giving up on life".  She also denies any regurgitation or difficulty swallowing of food however her speech is dysarthric and very quiet. COVID test returned positive. May explain decreased appetite as well. She is on RA 100%. Last MBSS was 09/2017 at [facility] with recommendation for D3/2 and thin liquids via small sips, pills whole in puree. BSE requested.   Assessment / Plan / Recommendation Clinical Impression  Clinical swallow evaluation completed at bedside. EPIC chart notes reviewed (see above for previous dysphagia information). Pt with mild right facial asymmetry from previous stroke, mild xerostomia, and reduced vocal intensity. Oral care completed and Pt able to initiate swallows following the same. Pt presented with only one immediate cough after presentation of thin water on spoon (no  issues with ice chips, cup sips, straw sips self presented), suspect premature spillage. No further signs of aspiration or reduced airway protection throughout po trials. Pt states that she feels her swallowing is at baseline, denies changes in taste/smell, but simply reports decreased appetite. Pt was apparently positive for Covid 04/01/20 so perhaps this had lead to her reduced intake. OK to resume baseline diet of D3/mech soft and thin liquids via small cup/straw  sips when alert and upright, po medications whole in puree or with water per Pt preference. No futher SLP services indicated at this time. Above to RN. SLP Visit Diagnosis: Dysphagia, unspecified (R13.10)    Aspiration Risk  Mild aspiration risk    Diet Recommendation Dysphagia 3 (Mech soft);Thin liquid   Liquid Administration via: Cup;Straw Medication Administration: Whole meds with liquid Supervision: Patient able to self feed;Intermittent supervision to cue for compensatory strategies Compensations: Small sips/bites Postural Changes: Seated upright at 90 degrees;Remain upright for at least 30 minutes after po intake    Other  Recommendations Oral Care Recommendations: Oral care BID;Staff/trained caregiver to provide oral care Other Recommendations: Clarify dietary restrictions   Follow up Recommendations 24 hour supervision/assistance      Frequency and Duration            Prognosis Prognosis for Safe Diet Advancement: Good      Swallow Study   General Date of Onset: 04/16/20 HPI: Kelly Romero is a 55 yo female with PMH CVA (residual right side weakness), DMII, MDD, HTN, HLD, apraxia and dysphagia (s/p PEG tube, now removed) who resides at the [facility] and was referred to the ER for increase in her creatinine. She is reported to have had significantly decreased oral intake over the past couple weeks.  Her PEG tube has been removed previously (07/2018) due to her appetite increasing and no longer requiring it however lately she has stopped eating much for unknown reason. When asked in the ER, she states that she just has not liked the taste of food lately.  She denies any worsening depression or thoughts of "giving up on life".  She also denies any regurgitation or difficulty swallowing of food however her speech is dysarthric and very quiet. COVID test returned positive. May explain decreased appetite as well. She is on RA 100%. Last MBSS was 09/2017 at [facility] with recommendation for  D3/2 and thin liquids via small sips, pills whole in puree. BSE requested. Type of Study: Bedside Swallow Evaluation Previous Swallow Assessment: BSE 08/2018 D3/thin; MBSS 09/2017 D3/2 thin small sips, pills whole in puree Diet Prior to this Study: NPO Temperature Spikes Noted: No Respiratory Status: Room air History of Recent Intubation: No Behavior/Cognition: Alert;Cooperative;Pleasant mood Oral Cavity Assessment: Dry Oral Care Completed by SLP: Yes Oral Cavity - Dentition: Adequate natural dentition Vision: Functional for self-feeding Self-Feeding Abilities: Able to feed self;Needs set up Patient Positioning: Upright in bed Baseline Vocal Quality: Normal;Low vocal intensity Volitional Cough: Weak Volitional Swallow: Able to elicit    Oral/Motor/Sensory Function Overall Oral Motor/Sensory Function: Mild impairment (mild right facial weakness) Facial ROM: Reduced right Facial Symmetry: Abnormal symmetry right Facial Strength: Within Functional Limits Facial Sensation: Within Functional Limits Lingual ROM: Within Functional Limits Lingual Symmetry: Within Functional Limits Lingual Strength: Reduced Lingual Sensation: Within Functional Limits Velum: Within Functional Limits Mandible: Within Functional Limits   Ice Chips Ice chips: Within functional limits Presentation: Spoon   Thin Liquid Thin Liquid: Within functional limits Presentation: Cup;Spoon;Straw (one cough with tsp thin, otherwise [facility])  Nectar Thick Nectar Thick Liquid: Not tested   Honey Thick Honey Thick Liquid: Not tested   Puree Puree: Within functional limits Presentation: Spoon   Solid     Solid: Impaired Presentation: Self Fed Oral Phase Functional Implications: Prolonged oral transit     Thank you,  Havery Moros, CCC-SLP [phone]  PORTER,DABNEY 04/17/2020,12:00 PM

## 2020-04-17 NOTE — Progress Notes (Signed)
Drank well from straw.  Offered stuffing with gravy and spit back out, saying she didn't like it.  Offered ice cream and ate about half of carton and some peaches.   Drank about half of ensure and then refused more.  Did ask for water and drank most of cup.

## 2020-04-17 NOTE — TOC Initial Note (Addendum)
Transition of Care [facility]) - Initial/Assessment Note    Patient Details  Name: Kelly Romero MRN: [id number] Date of Birth: 1965/09/30  Transition of Care [facility]) CM/SW Contact:    Karn Cassis, LCSW Phone Number: 04/17/2020, 8:41 AM  Clinical Narrative:  Pt admitted due to acute kidney injury and was found to be COVID +. Pt has been a resident at [facility] since June 2020. She has history of CVA with right side weakness. Pt reports her daughter is involved and supportive. She plans to return to [facility] when medically stable. LCSW noted diagnosis of dementia. LCSW spoke with pt's daughter who is also agreeable to return to [facility]. LCSW spoke with Revonda Standard at facility who confirms pt is long term resident. Pt is not ambulatory and requires max assist with all ADLs. Revonda Standard is aware pt is COVID +. Okay to return at d/c. TOC will continue to follow.                Update: Per Revonda Standard, pt was COVID + on 1/17. MD updated.   Expected Discharge Plan: Skilled Nursing Facility Barriers to Discharge: Continued Medical Work up   Patient Goals and CMS Choice Patient states their goals for this hospitalization and ongoing recovery are:: return to SNF   Choice offered to / list presented to : Patient  Expected Discharge Plan and Services Expected Discharge Plan: Skilled Nursing Facility In-house Referral: Clinical Social Work   Post Acute Care Choice: Resumption of Svcs/PTA Provider Living arrangements for the past 2 months: Skilled Nursing Facility                 DME Arranged: N/A                    Prior Living Arrangements/Services Living arrangements for the past 2 months: Skilled Nursing Facility Lives with:: Facility Resident Patient language and need for interpreter reviewed:: Yes Do you feel safe going back to the place where you live?: Yes            Criminal Activity/Legal Involvement Pertinent to Current Situation/Hospitalization: No  - Comment as needed  Activities of Daily Living      Permission Sought/Granted   Permission granted to share information with : Yes, Verbal Permission Granted     Permission granted to share info w AGENCY: [facility]  Permission granted to share info w Relationship: SNF     Emotional Assessment       Orientation: : Oriented to Self,Oriented to Situation,Oriented to Place Alcohol / Substance Use: Not Applicable Psych Involvement: No (comment)  Admission diagnosis:  AKI (acute kidney injury) (HCC) [N17.9] Patient Active Problem List   Diagnosis Date Noted  . Abnormal EKG 04/16/2020  . COVID-19 virus infection 04/16/2020  . Hemiparesis affecting right side as late effect of cerebrovascular accident (HCC) 09/14/2017  . Extension of stroke (HCC) 08/06/2017  . Inadequate dietary intake 08/06/2017  . Hypoalbuminemia due to protein-calorie malnutrition (HCC)   . Orthostatic hypotension   . Poorly controlled type 2 diabetes mellitus with peripheral neuropathy (HCC)   . Emotional lability   . Thrombotic stroke (HCC) 07/13/2017  . Diabetes mellitus type 2 in obese (HCC)   . History of CVA (cerebrovascular accident)   . Benign essential HTN   . Late effect of cerebrovascular accident (CVA)   . Acute blood loss anemia   . Morbid obesity (HCC)   . Right hemiparesis (HCC)   . Cryptogenic stroke (HCC)  07/08/2017  . Chest pain 11/06/2016  . Mid (multi infarct dementia), without behavioral disturbance (HCC) 04/10/2016  . Aphasia as late effect of stroke 04/10/2016  . Encephalopathy   . Cerebral thrombosis with cerebral infarction 12/29/2015  . Hyperglycemia   . Hyperlipidemia   . Hypertensive emergency   . Deep venous thrombosis (HCC)   . AKI (acute kidney injury) (HCC) 12/26/2015  . Altered mental status   . Stroke-like symptoms   . Acute encephalopathy 08/17/2015  . Anxiety and depression   . CVA (cerebrovascular accident due to intracerebral hemorrhage) (HCC)  08/15/2015  . Stroke (HCC)   . Encephalopathy, metabolic 12/28/2014  . 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 Essential hypertension 12/28/2014  . New onset headache 11/02/2014   PCP:  Galvin Proffer, MD Pharmacy:   [facility] [place], [place] - [address] [place] [address] Phone: [phone] Fax: [phone]     Social Determinants of Health (SDOH) Interventions    Readmission Risk Interventions No flowsheet data found.

## 2020-04-17 NOTE — Progress Notes (Addendum)
PROGRESS NOTE    [name]  MVH:[id number] DOB: [birth year] DOA: 04/16/2020 PCP: Kelly Proffer, MD    Brief Narrative:  Ms. Kelly Romero is a 55 yo female with PMH CVA (residual right side weakness), DMII, MDD, HTN, HLD, apraxia and dysphagia (s/p PEG tube, now removed) who resides at the [facility] and was referred to the ER for increase in her creatinine in the setting of worsening PO intake for several weeks. Also known to be COVID+ since 1/17.    Assessment & Plan:   Principal Problem:   AKI (acute kidney injury) (HCC) Active Problems:   Anxiety and depression   Diabetes mellitus type 2 in obese (HCC)   History of CVA (cerebrovascular accident)   Abnormal EKG   COVID-19 virus infection   * AKI (acute kidney injury) (HCC) Likely 2/2 poor PO intake in the setting of COVID infection. If unable to maintain PO intake, will need to consider replacing PEG tube vs transition to hospice. Improving s/p NS bolus x2,  Cr 2.57>1.63. Currently receiving IVF at 64mL/hr. UA pending. Passed swallow screen.  - Encourage PO intake - Calorie count - Caloric supplementation per RD recs - Continue IVF at 28mL/hr - Follow-up UA  COVID-19 virus infection Asymptomatic aside from reduced appetite as above. Maintining O2 sats on RA. CRP d-dimer, ferritin all WNL.  - hold off on steroid or remdesivir since not on O2 however may be a MAB candidate for outpatient therapy pending length of hospitalization  Abnormal EKG TWI noted in V5, V6 not present on previous; patient denies CP. Troponin mildly elevated at 54, likely mildly elevated in the setting of her AKI.  - suspect poor interventional candidate given co-morbid conditions  History of CVA (cerebrovascular accident) - s/p PEG placement which has since been removed  - residual right side weakness and dysarthria  - see AKI discussion regarding if patient needs PEG again vs GOC discussions  - Given that she passed swallow screen, okay to  resume PO meds  Diabetes mellitus type 2 in obese (HCC) Glucoses in 70s, A1c 5.8. Had been receiving Levemir 20u daily at home. Likely contributing to low glucose readings in the setting of poor PO intake.   - No LAI at this time  - SSI, CBG monitoring  Anxiety and depression - Continue home Lexapro   ? Seizure History Per chart review, it seems patient is on depakote 500mg  BID for a questionable history of seizures  - Continue home depakote  DVT prophylaxis: heparin injection 5,000 Units Start: 04/16/20 2200  Code Status: Full Family Communication: Patient lives at [facility] in [place]. Patient's daughter Kelly Romero is preferred contact.  Disposition Plan: Status is: Inpatient  Remains inpatient appropriate because:Inpatient level of care appropriate due to severity of illness   Dispo: The patient is from: SNF              Anticipated d/c is to: SNF              Anticipated d/c date is: 1 day              Patient currently is not medically stable to d/c.   Difficult to place patient No  Consultants:   Nutrition;   Procedures:   None  Antimicrobials:   None   Subjective: Ms. [name] reports feeling somewhat weak today. She says that she would like to try eating and drinking some.   Objective: Vitals:   04/17/20 0500 04/17/20 0700 04/17/20 0730 04/17/20  0800  BP: 117/63 126/60 126/66 (!) 114/57  Pulse: 65 76 73   Resp: 17  10   Temp:      SpO2: 97% 98% 100%     Intake/Output Summary (Last 24 hours) at 04/17/2020 3664 Last data filed at 04/16/2020 2150 Gross per 24 hour  Intake 2000 ml  Output --  Net 2000 ml   There were no vitals filed for this visit.  Examination:  General exam: Sitting up in bed comfortably Respiratory system: Clear to auscultation. Respiratory effort normal. Cardiovascular system: S1 & S2 heard, RRR. No murmurs, rubs, gallops or clicks. No pedal edema. Gastrointestinal system: Abdomen is nondistended, soft and nontender. No  organomegaly or masses felt. Normal bowel sounds heard. Central nervous system: Speech dysarthric; Notable R-sided weakness of upper and lower extremities Skin: No rashes, lesions or ulcers Psychiatry: Judgement and insight appear normal. Mood & affect appropriate.     Data Reviewed: I have personally reviewed following labs and imaging studies  CBC: Recent Labs  Lab 04/16/20 1801 04/17/20 0750  WBC 3.9* 2.7*  NEUTROABS 2.4 1.2*  HGB 9.9* 8.7*  HCT 32.4* 28.3*  MCV 97.6 98.3  PLT 226 164   Basic Metabolic Panel: Recent Labs  Lab 04/16/20 1801 04/17/20 0750  NA 136 140  K 3.6 3.4*  CL 96* 104  CO2 26 24  GLUCOSE 114* 79  BUN 66* 52*  CREATININE 2.57* 1.63*  CALCIUM 9.9 9.1  MG  --  2.0   GFR: CrCl cannot be calculated (Unknown ideal weight.). Liver Function Tests: Recent Labs  Lab 04/16/20 1801  AST 24  ALT 16  ALKPHOS 47  BILITOT 1.0  PROT 6.7  ALBUMIN 3.1*   Recent Labs  Lab 04/16/20 1801  LIPASE 22   No results for input(s): AMMONIA in the last 168 hours. Coagulation Profile: No results for input(s): INR, PROTIME in the last 168 hours. Cardiac Enzymes: No results for input(s): CKTOTAL, CKMB, CKMBINDEX, TROPONINI in the last 168 hours. BNP (last 3 results) No results for input(s): PROBNP in the last 8760 hours. HbA1C: No results for input(s): HGBA1C in the last 72 hours. CBG: Recent Labs  Lab 04/16/20 2354 04/17/20 0411 04/17/20 0803  GLUCAP 76 76 71   Lipid Profile: No results for input(s): CHOL, HDL, LDLCALC, TRIG, CHOLHDL, LDLDIRECT in the last 72 hours. Thyroid Function Tests: No results for input(s): TSH, T4TOTAL, FREET4, T3FREE, THYROIDAB in the last 72 hours. Anemia Panel: Recent Labs    04/16/20 2328  FERRITIN 295   Sepsis Labs: Recent Labs  Lab 04/16/20 2328  PROCALCITON <0.10    Recent Results (from the past 240 hour(s))  SARS Coronavirus 2 by RT PCR (hospital order, performed in [facility] hospital lab)  Nasopharyngeal Nasopharyngeal Swab     Status: Abnormal   Collection Time: 04/16/20  9:01 PM   Specimen: Nasopharyngeal Swab  Result Value Ref Range Status   SARS Coronavirus 2 POSITIVE (A) NEGATIVE Final    Comment: RESULT CALLED TO, READ BACK BY AND VERIFIED WITH: M BRAME,RN@2310  04/16/20 MKELLY (NOTE) SARS-CoV-2 target nucleic acids are DETECTED  SARS-CoV-2 RNA is generally detectable in upper respiratory specimens  during the acute phase of infection.  Positive results are indicative  of the presence of the identified virus, but do not rule out bacterial infection or co-infection with other pathogens not detected by the test.  Clinical correlation with patient history and  other diagnostic information is necessary to determine patient infection status.  The expected  result is negative.  Fact Sheet for Patients:   BoilerBrush.com.cy   Fact Sheet for Healthcare Providers:   https://pope.com/    This test is not yet approved or cleared by the [place] FDA and  has been authorized for detection and/or diagnosis of SARS-CoV-2 by FDA under an Emergency Use Authorization (EUA).  This EUA will remain in effect (meaning this test  can be used) for the duration of  the COVID-19 declaration under Section 564(b)(1) of the Act, 21 U.S.C. section 360-bbb-3(b)(1), unless the authorization is terminated or revoked sooner.  Performed at [facility], [address], [place], [place] [address]          Radiology Studies: DG Abdomen Acute W/Chest  Result Date: 04/16/2020 CLINICAL DATA:  Decreased p.o. intake EXAM: DG ABDOMEN ACUTE WITH 1 VIEW CHEST COMPARISON:  Radiograph 07/20/2017 FINDINGS: Some streaky atelectatic changes are present in the lungs. No consolidation, features of edema, pneumothorax, or effusion. The cardiomediastinal contours are unremarkable. Loop recorder projects over the left heart border. No acute osseous or soft  tissue abnormality. No subdiaphragmatic free air. Normal bowel gas pattern. No suspicious abdominal calcifications. Postsurgical changes noted in right upper quadrant and low midline pelvis as well as extensive hernia mesh anchors likely along the anterior abdominal wall. Extensive atherosclerotic calcification in the abdomen and pelvis with multiple phleboliths in the deep pelvis. Multilevel degenerative changes spine more moderate degenerative changes in the bilateral hips and SI joints. IMPRESSION: 1. Streaky atelectatic changes in the lungs. No other acute cardiopulmonary abnormality. 2. Nonobstructive bowel gas pattern. No free air. Electronically Signed   By: Kreg Shropshire M.D.   On: 04/16/2020 19:20        Scheduled Meds: . heparin  5,000 Units Subcutaneous Q8H  . insulin aspart  0-6 Units Subcutaneous Q4H  . sodium chloride flush  3 mL Intravenous Q12H   Continuous Infusions: . dextrose 5 % and 0.45% NaCl 1,000 mL with potassium chloride 20 mEq infusion       LOS: 1 day    Time spent: 35 min    Dorothyann Gibbs, MD [facility]   If 7PM-7AM, please contact night-coverage [email or web address]  04/17/2020, 8:28 AM    Attending note:   Patient seen and examined with Dorothyann Gibbs, Medical student. In addition to supervising the encounter, I played a key role in the decision making process as well as reviewed key findings.  I agree with the assessment and plan as noted above.  [employer]

## 2020-04-18 DIAGNOSIS — F32A Depression, unspecified: Secondary | ICD-10-CM

## 2020-04-18 DIAGNOSIS — F419 Anxiety disorder, unspecified: Secondary | ICD-10-CM

## 2020-04-18 LAB — CBC WITH DIFFERENTIAL/PLATELET
Abs Immature Granulocytes: 0.01 10*3/uL (ref 0.00–0.07)
Basophils Absolute: 0 10*3/uL (ref 0.0–0.1)
Basophils Relative: 0 %
Eosinophils Absolute: 0 10*3/uL (ref 0.0–0.5)
Eosinophils Relative: 1 %
HCT: 25.1 % — ABNORMAL LOW (ref 36.0–46.0)
Hemoglobin: 7.9 g/dL — ABNORMAL LOW (ref 12.0–15.0)
Immature Granulocytes: 0 %
Lymphocytes Relative: 60 %
Lymphs Abs: 1.8 10*3/uL (ref 0.7–4.0)
MCH: 30.4 pg (ref 26.0–34.0)
MCHC: 31.5 g/dL (ref 30.0–36.0)
MCV: 96.5 fL (ref 80.0–100.0)
Monocytes Absolute: 0.2 10*3/uL (ref 0.1–1.0)
Monocytes Relative: 7 %
Neutro Abs: 1 10*3/uL — ABNORMAL LOW (ref 1.7–7.7)
Neutrophils Relative %: 32 %
Platelets: 144 10*3/uL — ABNORMAL LOW (ref 150–400)
RBC: 2.6 MIL/uL — ABNORMAL LOW (ref 3.87–5.11)
RDW: 14.2 % (ref 11.5–15.5)
WBC: 3 10*3/uL — ABNORMAL LOW (ref 4.0–10.5)
nRBC: 0 % (ref 0.0–0.2)

## 2020-04-18 LAB — GLUCOSE, CAPILLARY
Glucose-Capillary: 135 mg/dL — ABNORMAL HIGH (ref 70–99)
Glucose-Capillary: 149 mg/dL — ABNORMAL HIGH (ref 70–99)
Glucose-Capillary: 164 mg/dL — ABNORMAL HIGH (ref 70–99)
Glucose-Capillary: 185 mg/dL — ABNORMAL HIGH (ref 70–99)
Glucose-Capillary: 72 mg/dL (ref 70–99)
Glucose-Capillary: 80 mg/dL (ref 70–99)
Glucose-Capillary: 87 mg/dL (ref 70–99)

## 2020-04-18 LAB — BASIC METABOLIC PANEL
Anion gap: 7 (ref 5–15)
BUN: 35 mg/dL — ABNORMAL HIGH (ref 6–20)
CO2: 25 mmol/L (ref 22–32)
Calcium: 8.8 mg/dL — ABNORMAL LOW (ref 8.9–10.3)
Chloride: 104 mmol/L (ref 98–111)
Creatinine, Ser: 1.01 mg/dL — ABNORMAL HIGH (ref 0.44–1.00)
GFR, Estimated: >60 mL/min (ref >60)
Glucose, Bld: 94 mg/dL (ref 70–99)
Potassium: 3 mmol/L — ABNORMAL LOW (ref 3.5–5.1)
Sodium: 136 mmol/L (ref 135–145)

## 2020-04-18 LAB — IRON AND TIBC
Iron: 42 ug/dL (ref 28–170)
Saturation Ratios: 19 % (ref 10.4–31.8)
TIBC: 217 ug/dL — ABNORMAL LOW (ref 250–450)
UIBC: 175 ug/dL

## 2020-04-18 LAB — PHOSPHORUS: Phosphorus: 1.8 mg/dL — ABNORMAL LOW (ref 2.5–4.6)

## 2020-04-18 LAB — PROCALCITONIN: Procalcitonin: <0.1 ng/mL

## 2020-04-18 LAB — FOLATE: Folate: 7 ng/mL (ref >5.9)

## 2020-04-18 LAB — MAGNESIUM: Magnesium: 1.7 mg/dL (ref 1.7–2.4)

## 2020-04-18 LAB — VITAMIN B12: Vitamin B-12: 1487 pg/mL — ABNORMAL HIGH (ref 180–914)

## 2020-04-18 MED ORDER — DIVALPROEX SODIUM 250 MG PO DR TAB
250.0000 mg | DELAYED_RELEASE_TABLET | Freq: Two times a day (BID) | ORAL | Status: DC
  Administered 2020-04-18 – 2020-04-19 (×3): 250 mg via ORAL
  Filled 2020-04-18 (×3): qty 1

## 2020-04-18 MED ORDER — SENNA 8.6 MG PO TABS
1.0000 | ORAL_TABLET | Freq: Every day | ORAL | Status: DC
  Administered 2020-04-18 – 2020-04-19 (×2): 8.6 mg via ORAL
  Filled 2020-04-18 (×2): qty 1

## 2020-04-18 MED ORDER — POTASSIUM PHOSPHATES 15 MMOLE/5ML IV SOLN
30.0000 mmol | Freq: Once | INTRAVENOUS | Status: AC
  Administered 2020-04-18: 30 mmol via INTRAVENOUS
  Filled 2020-04-18: qty 10

## 2020-04-18 MED ORDER — CITALOPRAM HYDROBROMIDE 20 MG PO TABS
20.0000 mg | ORAL_TABLET | Freq: Every day | ORAL | Status: DC
  Administered 2020-04-18 – 2020-04-19 (×2): 20 mg via ORAL
  Filled 2020-04-18 (×2): qty 1

## 2020-04-18 MED ORDER — POLYETHYLENE GLYCOL 3350 17 G PO PACK
1.0000 | PACK | Freq: Every day | ORAL | Status: DC
  Administered 2020-04-18 – 2020-04-19 (×2): 17 g via ORAL
  Filled 2020-04-18 (×2): qty 1

## 2020-04-18 NOTE — Progress Notes (Addendum)
PROGRESS NOTE    Kelly Romero  ZOX:[id number] DOB: Oct 05, 1965 DOA: 04/16/2020 PCP: Galvin Proffer, MD    Brief Narrative:  Kelly Romero is a 55 yo female with PMH CVA (residual right side weakness), DMII, MDD, HTN, HLD, apraxia and dysphagia (s/p PEG tube, now removed) who resides at the [facility] and was referred to the ER for increase in her creatinine in the setting of worsening PO intake for several weeks. Also known to be COVID+ since 1/17.   Assessment & Plan:   Principal Problem:   AKI (acute kidney injury) (HCC) Active Problems:   Anxiety and depression   Diabetes mellitus type 2 in obese (HCC)   History of CVA (cerebrovascular accident)   Abnormal EKG   COVID-19 virus infection   * AKI (acute kidney injury) (HCC) Much improved today s/p IVF. 2.57>1.63>1.01. Likely 2/2 poor PO intake in the setting of COVID infection. Improving s/p NS bolus x2,  UA with ketones and proteinuria. Passed swallow screen yesterday and placed on dysphagia 3 diet. However, nursing reports that she seems to have a hard-time with even semi-solid foods.  - Encourage PO intake - Dysphagia 1 diet - Calorie count - Caloric supplementation per RD recs, suggest shakes/supplements - D/c IVF as she is able to adequately hydrate PO -  If unable to maintain PO intake, will need to consider replacing PEG tube vs transition to hospice.  Hypophosphatemia  Hypokalemia: K 3.6>3.4>3.0. Phos 1.8. Liekly 2/2 poor PO intake/nutritional status. As noted in RD note, patient is at high risk for refeeding syndrome.  - Will replete with 30 mEq KPhos  Anemia Thrombocytopenia: Hgb 9.9>7.9. Platelets 226>144. It seems that these decreases are likely due to hemodilution as she was dehydrated on admission. MCV is 96.5 and Ferritin is 295. Anemia likely multifactorial, given combination of poor PO intake/nutritional status and an element of anemia of chronic disease as well.  Thrombocytopenia likely 2/2 COVID  infection.       - Check Iron, TIBC, Folate, B12      - Monitor closely   COVID-19 virus infection- Resolved Asymptomatic aside from reduced appetite as above. Maintining O2 sats on RA. CRP d-dimer, ferritin all WNL.  - No longer on precautions.  Abnormal EKG TWI noted in V5, V6 not present on previous; patient denies CP. Troponin mildly elevated at 54, likely mildly elevated in the setting of her AKI.  - suspect poor interventional candidate given co-morbid conditions  History of CVA (cerebrovascular accident) - s/p PEG placement which has since been removed  - residual right side weakness and dysarthria  - see AKI discussion regarding if patient needs PEG again vs GOC discussions - Given that she passed swallow screen, okay to resume PO meds  Diabetes mellitus type 2 in obese (HCC) Glucoses in 70s-170s, A1c 5.8. Had been receiving Levemir 20u daily at home. Likely contributing to low glucose readings in the setting of poor PO intake.   - No LAI at this time  - SSI, CBG monitoring  Anxiety and depression On Lexapro 20mg  daily at home     - As Lexapro is non-formulary here, will use citalopram 20mg  daily    ? Seizure History Per chart review, it seems patient is on depakote 250mg  BID for a questionable history of seizures  - Continue home depakote  DVT prophylaxis: heparin injection 5,000 Units Start: 04/16/20 2200 Code Status: Full Family Communication: Patient lives at [facility] in [place]. Patient's daughter Kelly Romero is preferred contact.  Disposition Plan: Status is: Inpatient   Remains inpatient appropriate because:Inpatient level of care appropriate due to severity of illness   Dispo: The patient is from: SNF              Anticipated d/c is to: SNF              Anticipated d/c date is: 1 day              Patient currently is not medically stable to d/c.   Difficult to place patient No   Consultants:   Nutrition  Procedures:    None  Antimicrobials:   None    Subjective: [name] reports feeling "okay" today. On further questioning about why she is not eating well, she reports that she is having a difficult time with the "flavor" of food. She also reports not having a bowel movement in several days.   Objective: Vitals:   04/17/20 1530 04/17/20 1630 04/17/20 1749 04/17/20 2126  BP: (!) 102/50 (!) 107/55 (!) 107/55 (!) 108/54  Pulse: 76 79 79 77  Resp: 13 16 16    Temp:  98.5 F (36.9 C) 98.5 F (36.9 C) 98.6 F (37 C)  TempSrc:   Oral Oral  SpO2: 100% 100%  100%  Weight:   68 kg   Height:   5\' 6"  (1.676 m)     Intake/Output Summary (Last 24 hours) at 04/18/2020 0745 Last data filed at 04/18/2020 0300 Gross per 24 hour  Intake 2034.97 ml  Output 200 ml  Net 1834.97 ml   Filed Weights   04/17/20 1749  Weight: 68 kg    Examination:  General exam: Sitting up in bed comfortably Respiratory system: Clear to auscultation. Respiratory effort normal. Cardiovascular system: S1 & S2 heard, RRR. No murmurs, rubs, gallops or clicks. No pedal edema. Gastrointestinal system: Abdomen is nondistended, soft and nontender. No organomegaly or masses felt. Normal bowel sounds heard. Central nervous system: Speech dysarthric; Notable R-sided weakness of upper and lower extremities Skin: No rashes, lesions or ulcers Psychiatry: Judgement and insight appear normal. Mood & affect appropriate.     Data Reviewed: I have personally reviewed following labs and imaging studies  CBC: Recent Labs  Lab 04/16/20 1801 04/17/20 0750 04/18/20 0540  WBC 3.9* 2.7* 3.0*  NEUTROABS 2.4 1.2* 1.0*  HGB 9.9* 8.7* 7.9*  HCT 32.4* 28.3* 25.1*  MCV 97.6 98.3 96.5  PLT 226 164 144*   Basic Metabolic Panel: Recent Labs  Lab 04/16/20 1801 04/17/20 0750 04/18/20 0540  NA 136 140 136  K 3.6 3.4* 3.0*  CL 96* 104 104  CO2 26 24 25   GLUCOSE 114* 79 94  BUN 66* 52* 35*  CREATININE 2.57* 1.63* 1.01*  CALCIUM 9.9  9.1 8.8*  MG  --  2.0 1.7  PHOS  --   --  1.8*   GFR: Estimated Creatinine Clearance: 58.2 mL/min (A) (by C-G formula based on SCr of 1.01 mg/dL (H)). Liver Function Tests: Recent Labs  Lab 04/16/20 1801  AST 24  ALT 16  ALKPHOS 47  BILITOT 1.0  PROT 6.7  ALBUMIN 3.1*   Recent Labs  Lab 04/16/20 1801  LIPASE 22   No results for input(s): AMMONIA in the last 168 hours. Coagulation Profile: No results for input(s): INR, PROTIME in the last 168 hours. Cardiac Enzymes: No results for input(s): CKTOTAL, CKMB, CKMBINDEX, TROPONINI in the last 168 hours. BNP (last 3 results) No results for input(s): PROBNP in the last 8760  hours. HbA1C: Recent Labs    04/16/20 2328  HGBA1C 5.8*   CBG: Recent Labs  Lab 04/17/20 1230 04/17/20 1639 04/17/20 2124 04/18/20 0100 04/18/20 0511  GLUCAP 103* 108* 175* 185* 87   Lipid Profile: No results for input(s): CHOL, HDL, LDLCALC, TRIG, CHOLHDL, LDLDIRECT in the last 72 hours. Thyroid Function Tests: No results for input(s): TSH, T4TOTAL, FREET4, T3FREE, THYROIDAB in the last 72 hours. Anemia Panel: Recent Labs    04/16/20 2328  FERRITIN 295   Sepsis Labs: Recent Labs  Lab 04/16/20 2328 04/17/20 0750  PROCALCITON <0.10 <0.10    Recent Results (from the past 240 hour(s))  SARS Coronavirus 2 by RT PCR (hospital order, performed in [facility] hospital lab) Nasopharyngeal Nasopharyngeal Swab     Status: Abnormal   Collection Time: 04/16/20  9:01 PM   Specimen: Nasopharyngeal Swab  Result Value Ref Range Status   SARS Coronavirus 2 POSITIVE (A) NEGATIVE Final    Comment: RESULT CALLED TO, READ BACK BY AND VERIFIED WITH: M BRAME,RN@2310  04/16/20 MKELLY (NOTE) SARS-CoV-2 target nucleic acids are DETECTED  SARS-CoV-2 RNA is generally detectable in upper respiratory specimens  during the acute phase of infection.  Positive results are indicative  of the presence of the identified virus, but do not rule out bacterial  infection or co-infection with other pathogens not detected by the test.  Clinical correlation with patient history and  other diagnostic information is necessary to determine patient infection status.  The expected result is negative.  Fact Sheet for Patients:   BoilerBrush.com.cy   Fact Sheet for Healthcare Providers:   https://pope.com/    This test is not yet approved or cleared by the [place] FDA and  has been authorized for detection and/or diagnosis of SARS-CoV-2 by FDA under an Emergency Use Authorization (EUA).  This EUA will remain in effect (meaning this test  can be used) for the duration of  the COVID-19 declaration under Section 564(b)(1) of the Act, 21 U.S.C. section 360-bbb-3(b)(1), unless the authorization is terminated or revoked sooner.  Performed at [facility], [address], [place], [place] [address]   MRSA PCR Screening     Status: None   Collection Time: 04/17/20  5:50 PM   Specimen: Nasopharyngeal  Result Value Ref Range Status   MRSA by PCR NEGATIVE NEGATIVE Final    Comment:        The GeneXpert MRSA Assay (FDA approved for NASAL specimens only), is one component of a comprehensive MRSA colonization surveillance program. It is not intended to diagnose MRSA infection nor to guide or monitor treatment for MRSA infections. Performed at [facility], [address] [place], [place] [address]          Radiology Studies: DG Abdomen Acute W/Chest  Result Date: 04/16/2020 CLINICAL DATA:  Decreased p.o. intake EXAM: DG ABDOMEN ACUTE WITH 1 VIEW CHEST COMPARISON:  Radiograph 07/20/2017 FINDINGS: Some streaky atelectatic changes are present in the lungs. No consolidation, features of edema, pneumothorax, or effusion. The cardiomediastinal contours are unremarkable. Loop recorder projects over the left heart border. No acute osseous or soft tissue abnormality. No subdiaphragmatic free air. Normal bowel  gas pattern. No suspicious abdominal calcifications. Postsurgical changes noted in right upper quadrant and low midline pelvis as well as extensive hernia mesh anchors likely along the anterior abdominal wall. Extensive atherosclerotic calcification in the abdomen and pelvis with multiple phleboliths in the deep pelvis. Multilevel degenerative changes spine more moderate degenerative changes in the bilateral hips and SI  joints. IMPRESSION: 1. Streaky atelectatic changes in the lungs. No other acute cardiopulmonary abnormality. 2. Nonobstructive bowel gas pattern. No free air. Electronically Signed   By: Kreg Shropshire M.D.   On: 04/16/2020 19:20        Scheduled Meds: . feeding supplement  237 mL Oral BID BM  . heparin  5,000 Units Subcutaneous Q8H  . insulin aspart  0-6 Units Subcutaneous Q4H  . sodium chloride flush  3 mL Intravenous Q12H   Continuous Infusions: . dextrose 5 % and 0.45 % NaCl with KCl 20 mEq/L 75 mL/hr at 04/17/20 0900     LOS: 2 days    Time spent: 30 minutes    Dorothyann Gibbs, Medical Student [facility]   If 7PM-7AM, please contact night-coverage [email or web address]  04/18/2020, 7:45 AM   Attending note:   Patient seen and examined with Dorothyann Gibbs, Medical student. In addition to supervising the encounter, I played a key role in the decision making process as well as reviewed key findings.  Overall, patient's acute kidney injury and dehydration have improved with IV fluids.  Her p.o. intake remains poor.  Calorie count is currently underway.  I had a long discussion with the patient's daughter regarding further prognosis.  If calorie count remains poor, will need to discuss the possibility of PEG tube replacement.  Daughter reports that in the past, patient has not wanted this.  Will likely need to further address goals of care with patient and daughter.  If the patient does not want a PEG tube and p.o. intake remains very poor, may need to consider  options such as hospice services.  [employer]

## 2020-04-18 NOTE — Progress Notes (Incomplete)
Breakfast - patient was unable to chew and swallow the Dys 3 diet that was given to her for breakfast.  Patient drank ~ 1/4 c. Of coffee through a straw and 3 spoonfuls of apple sauce.  She then refused to eat or drink anything else.  Ordered Dys 2 diet and will assess at lunch.  Lunch -  Futures trader -

## 2020-04-18 NOTE — Progress Notes (Signed)
Nutrition Follow-up  DOCUMENTATION CODES:   Not applicable  INTERVENTION:  D/c Ensure  24 hour cal count in progress, will follow-up with results 2/4  Magic cup BID with meals, each supplement provides 290 kcal and 9 grams of protein  CIB po daily with breakfast, each supplement with 237 ml whole milk provides 280 kcal and 13 grams of protein  [place] yogurt po daily with breakfast, each 4 oz container provides 90 kcal and 9 grams protein (likes strawberry)  [employer] pudding cup BID, each supplement provides 140 kcal and 4 grams protein  Feeding assistance with meals  Continue to monitor refeeding labs daily as po intake improves and labs stable  NUTRITION DIAGNOSIS:   Inadequate oral intake related to poor appetite as evidenced by per patient/family report. -ongoing  GOAL:   Patient will meet greater than or equal to 90% of their needs -unmet; addressing  MONITOR:   PO intake,Supplement acceptance,Labs,I & O's,Weight trends,Skin  REASON FOR ASSESSMENT:   Rounds,Other (Comment) (Calorie Count)    ASSESSMENT:   55 year old female admitted for AKI and found to be positive for COVID-19 presents from [facility] due for increase in creatinine and reported significant decrease in oral intake over the past couple of weeks. Past medical history significant of CVA with residual right sided weakness, apraxia and dysphagia s/p PEG tube, now removed, DM2, MDD, HTN, HLD, dementia, Bipolar 1 disorder, and DVT.  24 hour calorie count initiated in ED on 2/02, unfortunately envelope was not transferred with pt to floor, unable to provide results. Will re-start calorie count. RD has discussed with RN, envelope with instructions hanging on door. RN reports no po intake of breakfast tray, pocketing food prior to spitting it out. Diet downgraded to DYS 1 by MD this morning.   RD met with pt at bedside, observed open ice cream and Ensure on table. Pt reports not drinking the Ensure, says  she does not like it. When asked about food preferences, pt states "I like to eat junk" says pizza is her favorite. RD explained DYS 1 diet order, discussed menu options as well as alternate supplements. Food preferences have been updated in [facility] and she is agreeable to supplements as listed above. Pt reports sometimes having feeding difficulties, will order feeding assistance to encourage po intake.   Weight 68 kg (149.6 lbs) on 04/17/20 decreased 9.1 kg (20 lbs) from 77.1 kg on 03/04/20; significant 11.8%  Medications reviewed and include: Depakote, Celexa, SSI, Miralax, Senokot  Potassium Phosphate 30 mmol in D5 IV once  Labs: CBGs 80,72,87,185, K 3 (L), P 1.8 (L), Mg 1.7 (WNL) BUN 35 (H), Cr 1.01 (H)  NUTRITION - FOCUSED PHYSICAL EXAM:  Flowsheet Row Most Recent Value  Orbital Region Moderate depletion  [ecchymosis]  Upper Arm Region No depletion  Thoracic and Lumbar Region No depletion  Buccal Region Mild depletion  Temple Region Mild depletion  Clavicle Bone Region Mild depletion  Clavicle and Acromion Bone Region No depletion  Scapular Bone Region Unable to assess  Dorsal Hand Unable to assess  Patellar Region Moderate depletion  Anterior Thigh Region Unable to assess  Edema (RD Assessment) None  Hair Reviewed  Eyes Reviewed  Mouth Reviewed  [missing multiple bottom teeth]  Skin Reviewed  Nails Reviewed       Diet Order:   Diet Order            DIET - DYS 1 Room service appropriate? Yes; Fluid consistency: Thin  Diet effective  now                 EDUCATION NEEDS:   No education needs have been identified at this time  Skin:  Skin Assessment: Reviewed RN Assessment  Last BM:  pta  Height:   Ht Readings from Last 1 Encounters:  04/17/20 $RemoveB'5\' 6"'lHylXeDS$  (1.676 m)    Weight:   Wt Readings from Last 1 Encounters:  04/17/20 68 kg    BMI:  Body mass index is 24.2 kg/m.  Estimated Nutritional Needs: New d/t updated wt  Kcal:  2000-2200  Protein:   93-110  Fluid:  >/= 2L   Lajuan Lines, RD, LDN Clinical Nutrition After Hours/Weekend Pager # in [place]

## 2020-04-19 DIAGNOSIS — E43 Unspecified severe protein-calorie malnutrition: Secondary | ICD-10-CM

## 2020-04-19 DIAGNOSIS — E46 Unspecified protein-calorie malnutrition: Secondary | ICD-10-CM

## 2020-04-19 LAB — CBC WITH DIFFERENTIAL/PLATELET
Abs Immature Granulocytes: 0 10*3/uL (ref 0.00–0.07)
Basophils Absolute: 0 10*3/uL (ref 0.0–0.1)
Basophils Relative: 0 %
Eosinophils Absolute: 0 10*3/uL (ref 0.0–0.5)
Eosinophils Relative: 0 %
HCT: 27 % — ABNORMAL LOW (ref 36.0–46.0)
Hemoglobin: 8.4 g/dL — ABNORMAL LOW (ref 12.0–15.0)
Immature Granulocytes: 0 %
Lymphocytes Relative: 64 %
Lymphs Abs: 2 10*3/uL (ref 0.7–4.0)
MCH: 30.5 pg (ref 26.0–34.0)
MCHC: 31.1 g/dL (ref 30.0–36.0)
MCV: 98.2 fL (ref 80.0–100.0)
Monocytes Absolute: 0.2 10*3/uL (ref 0.1–1.0)
Monocytes Relative: 7 %
Neutro Abs: 0.9 10*3/uL — ABNORMAL LOW (ref 1.7–7.7)
Neutrophils Relative %: 29 %
Platelets: 118 10*3/uL — ABNORMAL LOW (ref 150–400)
RBC: 2.75 MIL/uL — ABNORMAL LOW (ref 3.87–5.11)
RDW: 14.3 % (ref 11.5–15.5)
WBC: 3.1 10*3/uL — ABNORMAL LOW (ref 4.0–10.5)
nRBC: 0 % (ref 0.0–0.2)

## 2020-04-19 LAB — GLUCOSE, CAPILLARY
Glucose-Capillary: 102 mg/dL — ABNORMAL HIGH (ref 70–99)
Glucose-Capillary: 106 mg/dL — ABNORMAL HIGH (ref 70–99)
Glucose-Capillary: 118 mg/dL — ABNORMAL HIGH (ref 70–99)

## 2020-04-19 LAB — BASIC METABOLIC PANEL
Anion gap: 7 (ref 5–15)
BUN: 22 mg/dL — ABNORMAL HIGH (ref 6–20)
CO2: 25 mmol/L (ref 22–32)
Calcium: 9 mg/dL (ref 8.9–10.3)
Chloride: 104 mmol/L (ref 98–111)
Creatinine, Ser: 0.87 mg/dL (ref 0.44–1.00)
GFR, Estimated: >60 mL/min (ref >60)
Glucose, Bld: 99 mg/dL (ref 70–99)
Potassium: 3.8 mmol/L (ref 3.5–5.1)
Sodium: 136 mmol/L (ref 135–145)

## 2020-04-19 LAB — MAGNESIUM: Magnesium: 1.6 mg/dL — ABNORMAL LOW (ref 1.7–2.4)

## 2020-04-19 MED ORDER — ONDANSETRON HCL 4 MG/2ML IJ SOLN
4.0000 mg | Freq: Four times a day (QID) | INTRAMUSCULAR | Status: DC | PRN
  Administered 2020-04-19: 4 mg via INTRAVENOUS
  Filled 2020-04-19: qty 2

## 2020-04-19 MED ORDER — POLYETHYLENE GLYCOL 3350 17 G PO PACK: PACK | ORAL | 0 refills | Status: AC

## 2020-04-19 MED ORDER — MAGNESIUM SULFATE 2 GM/50ML IV SOLN
2.0000 g | Freq: Once | INTRAVENOUS | Status: AC
  Administered 2020-04-19: 2 g via INTRAVENOUS
  Filled 2020-04-19: qty 50

## 2020-04-19 NOTE — Progress Notes (Signed)
  Speech Language Pathology Treatment: Dysphagia  Patient Details Name: Kelly Romero MRN: [id number] DOB: [birth year] Today's Date: 04/19/2020 Time: 9242-6834 SLP Time Calculation (min) (ACUTE ONLY): 25 min  Assessment / Plan / Recommendation Clinical Impression  Pt seen again for dysphagia intervention due to RN yesterday reporting that Pt was pocketing solid foods and expectorating. She was then downgraded to puree. The SLP at SNF indicated that Pt is on a mech soft diet, but has had FTT for some time. Pt seen again at bedside today and Pt denies difficulty swallowing, but states that she just isn't very hungry. She self fed regular textures with mildly prolonged oral prep and oral transit, but does clear and benefits from liquid wash. Pt did not eat her puree breakfast tray, but did eat a little bit of grits. She stated her food preferences as: spaghetti, lasagna, pepperoni pizza, soup, ham and cheese sandwiches, and chocolate/vanilla ice cream. Would recommend advancing back to D3/mech soft and thin liquids and offer Pt preferences as able. MD/RN in agreement with plan of care. Pt will need set up assist, sit fully upright, and encouragement for po intake.    HPI HPI: Kelly Romero is a 55 yo female with PMH CVA (residual right side weakness), DMII, MDD, HTN, HLD, apraxia and dysphagia (s/p PEG tube, now removed) who resides at the [facility] and was referred to the ER for increase in her creatinine. She is reported to have had significantly decreased oral intake over the past couple weeks.  Her PEG tube has been removed previously (07/2018) due to her appetite increasing and no longer requiring it however lately she has stopped eating much for unknown reason. When asked in the ER, she states that she just has not liked the taste of food lately.  She denies any worsening depression or thoughts of "giving up on life".  She also denies any regurgitation or difficulty swallowing of food however her  speech is dysarthric and very quiet. COVID test returned positive. May explain decreased appetite as well. She is on RA 100%. Last MBSS was 09/2017 at [facility] with recommendation for D3/2 and thin liquids via small sips, pills whole in puree. BSE requested.      SLP Plan  Continue with current plan of care;Discharge SLP treatment due to (comment)       Recommendations  Diet recommendations: Dysphagia 3 (mechanical soft);Thin liquid Liquids provided via: Cup;Straw Medication Administration: Whole meds with liquid Supervision: Patient able to self feed;Intermittent supervision to cue for compensatory strategies (Pt will need set up assist, sit upright, encouragement for intake) Compensations: Small sips/bites Postural Changes and/or Swallow Maneuvers: Seated upright 90 degrees;Upright 30-60 min after meal                Oral Care Recommendations: Oral care BID;Staff/trained caregiver to provide oral care Follow up Recommendations: 24 hour supervision/assistance SLP Visit Diagnosis: Dysphagia, unspecified (R13.10) Plan: Continue with current plan of care;Discharge SLP treatment due to (comment)       Thank you,  Havery Moros, CCC-SLP [phone]                Lamont Tant 04/19/2020, 11:35 AM

## 2020-04-19 NOTE — Progress Notes (Signed)
Calorie Count Note  24 hour calorie count ordered.  Diet: DYS 1: Thin liquids Supplements: Magic Cup BID (290 kcal, 9 grams protein) CIB daily with 237 ml whole milk (280 kcal and 13 grams protein) Pudding cup BID (140 kcal and 4 grams protein) Greek yogurt daily (90 kcal and 9 grams protein)  RD working remotely. Spoke with nursing staff via phone. Reports pt with poor po of breakfast, does not like puree diet. Pt diet has been advanced to DYS 3 s/p BSE.  2/4 Breakfast: 50% of grits, 100% juice (60 kcal, 1 gram protein) 2/3 Lunch: 25% mashed potatoes, 50% Magic Cup, 50% banana (218 kcal, 5 grams protein) 2/3 Dinner: 25% puree chicken with gravy, 100% Magic Cup (333 kcal, 13 grams protein)  Total intake:  611 kcal (34% of minimum estimated kcal needs)  19 grams protein (21% of minimum estimated protein needs)  Estimated Nutritional Needs:  Kcal:  1800-2040 Protein:  90-100 Fluid:  >1.7 L  Nutrition Dx: Inadequate oral intake related to poor appetite as evidenced by per patient/family report -ongoing  Goal: Pt will meet >/= 90% of estimated needs -unmet  Intervention: Will monitor po intake of lunch and dinner meals given diet advancement to DYS 3. If po intake has not improved, recommend considering replacing PEG tube for nutrition support  -Continue with current ONS interventions as above  Lars Masson, RD, LDN Clinical Nutrition After Hours/Weekend Pager # in [place]

## 2020-04-19 NOTE — Progress Notes (Signed)
Report called to RN at [facility] .

## 2020-04-19 NOTE — [place] FL2 (Signed)
[place] MEDICAID FL2 LEVEL OF CARE SCREENING TOOL     IDENTIFICATION  Patient Name: Kelly Romero Birthdate: [birth year] Sex: female Admission Date (Current Location): 04/16/2020  [place] and [place] Number:  Roda Shutters [id number] Facility and Address:  [facility],  [address] [place] [address]      Provider Number: [id number]  Attending Physician Name and Address:  Erick Blinks, MD  Relative Name and Phone Number:  Evelina Bucy (daughter) Ph: [phone]    Current Level of Care: Hospital Recommended Level of Care: Nursing Facility Prior Approval Number:    Date Approved/Denied:   PASRR Number:    Discharge Plan: SNF    Current Diagnoses: Patient Active Problem List   Diagnosis Date Noted  . Protein-calorie malnutrition (HCC) 04/19/2020  . Abnormal EKG 04/16/2020  . COVID-19 virus infection 04/16/2020  . Hemiparesis affecting right side as late effect of cerebrovascular accident (HCC) 09/14/2017  . Extension of stroke (HCC) 08/06/2017  . Inadequate dietary intake 08/06/2017  . Hypoalbuminemia due to protein-calorie malnutrition (HCC)   . Orthostatic hypotension   . Poorly controlled type 2 diabetes mellitus with peripheral neuropathy (HCC)   . Emotional lability   . Thrombotic stroke (HCC) 07/13/2017  . Diabetes mellitus type 2 in obese (HCC)   . History of CVA (cerebrovascular accident)   . Benign essential HTN   . Late effect of cerebrovascular accident (CVA)   . Acute blood loss anemia   . Morbid obesity (HCC)   . Right hemiparesis (HCC)   . Cryptogenic stroke (HCC) 07/08/2017  . Chest pain 11/06/2016  . Mid (multi infarct dementia), without behavioral disturbance (HCC) 04/10/2016  . Aphasia as late effect of stroke 04/10/2016  . Encephalopathy   . Cerebral thrombosis with cerebral infarction 12/29/2015  . Hyperglycemia   . Hyperlipidemia   . Hypertensive emergency   . Deep venous thrombosis (HCC)   . AKI (acute kidney injury)  (HCC) 12/26/2015  . Altered mental status   . Stroke-like symptoms   . Acute encephalopathy 08/17/2015  . Anxiety and depression   . CVA (cerebrovascular accident due to intracerebral hemorrhage) (HCC) 08/15/2015  . Stroke (HCC)   . Encephalopathy, metabolic 12/28/2014  . 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 Essential hypertension 12/28/2014  . New onset headache 11/02/2014    Orientation RESPIRATION BLADDER Height & Weight     Self,Situation,Place  Normal Incontinent (purewick) Weight: 149 lb 14.6 oz (68 kg) Height:  5\' 6"  (167.6 cm)  BEHAVIORAL SYMPTOMS/MOOD NEUROLOGICAL BOWEL NUTRITION STATUS      Incontinent Diet (Dysphagia)  AMBULATORY STATUS COMMUNICATION OF NEEDS Skin   Total Care Verbally Other (Comment) (Ecchymosis)                       Personal Care Assistance Level of Assistance  Bathing,Feeding,Dressing Bathing Assistance: Maximum assistance Feeding assistance: Maximum assistance Dressing Assistance: Maximum assistance     Functional Limitations Info  Sight,Hearing,Speech Sight Info: Adequate Hearing Info: Adequate Speech Info: Impaired    SPECIAL CARE FACTORS FREQUENCY                       Contractures Contractures Info: Not present    Additional Factors Info  Code Status,Allergies,Isolation Precautions,Psychotropic Code Status Info: Full code Allergies Info: Erythromycin, Latex Psychotropic Info: Depakote, Lexapro   Isolation Precautions Info: Airborne/Contact precautions     Current Medications (04/19/2020):  This is the current hospital active medication list  Current Facility-Administered Medications  Medication Dose Route Frequency Provider Last Rate Last Admin  . acetaminophen (TYLENOL) tablet 650 mg  650 mg Oral Q6H PRN Lewie Chamber, MD       Or  . acetaminophen (TYLENOL) suppository 650 mg  650 mg Rectal Q6H PRN Lewie Chamber, MD      . citalopram (CELEXA) tablet 20 mg  20 mg  Oral Daily Erick Blinks, MD   20 mg at 04/19/20 0855  . divalproex (DEPAKOTE) DR tablet 250 mg  250 mg Oral Q12H Erick Blinks, MD   250 mg at 04/19/20 0855  . guaiFENesin-dextromethorphan (ROBITUSSIN DM) 100-10 MG/5ML syrup 5 mL  5 mL Oral Q4H PRN Erick Blinks, MD      . heparin injection 5,000 Units  5,000 Units Subcutaneous Eliezer Lofts, MD   5,000 Units at 04/19/20 [phone]  . insulin aspart (novoLOG) injection 0-6 Units  0-6 Units Subcutaneous Q4H Lewie Chamber, MD   1 Units at 04/18/20 1700  . magnesium sulfate IVPB 2 g 50 mL  2 g Intravenous Once Erick Blinks, MD      . ondansetron (ZOFRAN) injection 4 mg  4 mg Intravenous Q6H PRN Zierle-Ghosh, Asia B, DO   4 mg at 04/19/20 0106  . polyethylene glycol (MIRALAX / GLYCOLAX) packet 17 g  1 packet Oral Daily Erick Blinks, MD   17 g at 04/19/20 0859  . senna (SENOKOT) tablet 8.6 mg  1 tablet Oral Daily Erick Blinks, MD   8.6 mg at 04/19/20 0855  . sodium chloride flush (NS) 0.9 % injection 3 mL  3 mL Intravenous Q12H Lewie Chamber, MD   3 mL at 04/19/20 7591     Discharge Medications: Please see discharge summary for a list of discharge medications.  Relevant Imaging Results:  Relevant Lab Results:   Additional Information COVID + 04/16/20.  Ewing Schlein, LCSW

## 2020-04-19 NOTE — Care Management Important Message (Addendum)
Important Message  Patient Details  Name: Kelly Romero MRN: [id number] Date of Birth: 1965/07/25   Medicare Important Message Given:  Yes  Spoke with Evelina Bucy (daughter) at [phone] to review letter    Corey Harold 04/19/2020, 1:59 PM

## 2020-04-19 NOTE — Progress Notes (Signed)
Patient discharged to [facility], IV removed from Left [facility]. Discharge packet given to EMS with signed DNR. Transferred to stretcher without incident

## 2020-04-19 NOTE — TOC Transition Note (Signed)
Transition of Care [facility]) - CM/SW Discharge Note  Patient Details  Name: Kelly Romero MRN: [id number] Date of Birth: [birth year]  Transition of Care [facility]) CM/SW Contact:  Ewing Schlein, LCSW Phone Number: 04/19/2020, 1:50 PM  Clinical Narrative: Per Revonda Standard with Franz Dell, patient can return today with hospice/palliative care services being recommended. BCY to set up at facility. FL2 completed. Discharge orders, discharge summary, SNF transfer report, and FL2 faxed in HUB to BCY. EMS scheduled; medical necessity form completed and provided to RN station. RN to call report. TOC signing off.  Final next level of care: Skilled Nursing Facility Barriers to Discharge: Barriers Resolved  Patient Goals and CMS Choice Patient states their goals for this hospitalization and ongoing recovery are:: Discharge back to [facility] with palliative/hospice care CMS Medicare.gov Compare Post Acute Care list provided to:: Patient Choice offered to / list presented to : Patient  Discharge Placement         Patient chooses bed at: [facility] Patient to be transferred to facility by: [facility]  Discharge Plan and Services In-house Referral: Clinical Social Work Post Acute Care Choice: Resumption of Svcs/PTA Provider          DME Arranged: N/A DME Agency: NA HH Arranged: NA HH Agency: NA  Readmission Risk Interventions No flowsheet data found.

## 2020-04-19 NOTE — Progress Notes (Signed)
This Clinical research associate called patients daughter Aurther Loft , Left VM for her to call this writer back at [phone]

## 2020-04-19 NOTE — Discharge Summary (Addendum)
Physician Discharge Summary  Kelly Romero TDD:[id number] DOB: [birth year] DOA: 04/16/2020  PCP: Galvin Proffer, MD  Admit date: 04/16/2020 Discharge date: 04/19/2020  Admitted From: [facility] Disposition:  [facility]  Recommendations for Outpatient Follow-up:  1. Please have palliative care/hospice follow patient on discharge to SNF  Home Health: Patient receives therapy services at her SNF Equipment/Devices: N/a  Discharge Condition: Patient is at her pre-hospital baseline functional status CODE STATUS: DNR/DNI Diet recommendation: Mechanical soft with thin liquids  Brief/Interim Summary: Kelly [facility]ero [facility] on [facility]1 after she was found to have an increased creatinine in the setting of several weeks of poor PO intake at her SNF. She was also confirmed to be COVID+.  Her AKI rapidly improved with IV fluids. (Cr 2.57>1.01) However, her PO intake remained poor, despite trying several supplements available in the hospital. Per the family report, this has been a longstanding issue, but seems worse since she was diagnosed with COVID on 1/17. We discussed the possibility of replacing her PEG tube, but the patient refused this plan. The patient voiced understanding that her decreased PO intake could cause a decline in her health status but would like to avoid further intervention.  Further goals of care were discussed with patients daughter. It was explained that with patient's diminished po intake and refusal of PEG, she would likely continue to decline. Hospice/palliative care services were recommended at [facility] facility. Code status was also discussed and daughter agreed to DNR status. If patient begins to decline, would transition to comfort measures.  Discharge Diagnoses:  Principal Problem:   Protein-calorie malnutrition (HCC) Active Problems:   Anxiety and depression   AKI (acute kidney injury) (HCC)   Diabetes mellitus type 2 in obese (HCC)    History of CVA (cerebrovascular accident)   Abnormal EKG   COVID-19 virus infection    Discharge Instructions  Discharge Instructions    Diet - low sodium heart healthy   Complete by: As directed    Increase activity slowly   Complete by: As directed      Allergies as of 04/19/2020      Reactions   Erythromycin Itching   Latex Itching      Medication List    STOP taking these medications   cephALEXin 500 MG capsule Commonly known as: KEFLEX   insulin detemir 100 UNIT/ML injection Commonly known as: LEVEMIR   lisinopril 5 MG tablet Commonly known as: ZESTRIL   metoprolol tartrate 25 MG tablet Commonly known as: LOPRESSOR     TAKE these medications   clopidogrel 75 MG tablet Commonly known as: PLAVIX Take 1 tablet (75 mg total) by mouth daily.   divalproex 250 MG DR tablet Commonly known as: Depakote Take 1 tablet (250 mg total) by mouth 2 (two) times daily.   escitalopram 20 MG tablet Commonly known as: Lexapro Take 1 tablet (20 mg total) by mouth daily.   polyethylene glycol 17 g packet Commonly known as: MIRALAX / GLYCOLAX May take 17 g with 8 oz water per peg tube daily prn   rosuvastatin 40 MG tablet Commonly known as: CRESTOR Take 1 tablet (40 mg total) by mouth at bedtime.       Allergies  Allergen Reactions  . Erythromycin Itching  . Latex Itching    Consultations: Nutrition    Procedures/Studies: DG Abdomen Acute W/Chest  Result Date: 04/16/2020 CLINICAL DATA:  Decreased p.o. intake EXAM: DG ABDOMEN ACUTE WITH 1 VIEW CHEST COMPARISON:  Radiograph  07/20/2017 FINDINGS: Some streaky atelectatic changes are present in the lungs. No consolidation, features of edema, pneumothorax, or effusion. The cardiomediastinal contours are unremarkable. Loop recorder projects over the left heart border. No acute osseous or soft tissue abnormality. No subdiaphragmatic free air. Normal bowel gas pattern. No suspicious abdominal calcifications. Postsurgical  changes noted in right upper quadrant and low midline pelvis as well as extensive hernia mesh anchors likely along the anterior abdominal wall. Extensive atherosclerotic calcification in the abdomen and pelvis with multiple phleboliths in the deep pelvis. Multilevel degenerative changes spine more moderate degenerative changes in the bilateral hips and SI joints. IMPRESSION: 1. Streaky atelectatic changes in the lungs. No other acute cardiopulmonary abnormality. 2. Nonobstructive bowel gas pattern. No free air. Electronically Signed   By: Kreg Shropshire M.D.   On: 04/16/2020 19:20   CUP PACEART REMOTE DEVICE CHECK  Result Date: 03/27/2020 ILR summary report received. Battery status OK. Normal device function. No new symptom, tachy, brady, or pause episodes. No new AF episodes. Monthly summary reports and ROV/PRN. HB     Subjective: Kelly Romero reports feeling "better" today. She continues to adamantly refuse PEG tube replacement and feels that she will be able to increase her intake. She acknowledges that poor PO intake could lead to a decline in her health status, but would still like to return home today.   Discharge Exam: Vitals:   04/18/20 2146 04/18/20 2340 04/19/20 0000 04/19/20 0503  BP: (!) 83/50 (!) 96/50 95/69 (!) 106/50  Pulse: 74 71  70  Resp: 16 17  16   Temp: 98.3 F (36.8 C) 97.8 F (36.6 C)  (!) 97.2 F (36.2 C)  TempSrc:      SpO2: 100% 100%  99%  Weight:      Height:        General: Pt is alert, awake, not in acute distress Cardiovascular: RRR, S1/S2 +, no rubs, no gallops Respiratory: CTA bilaterally, no wheezing, no rhonchi Abdominal: Soft, NT, ND, bowel sounds + Extremities: no edema, no cyanosis    The results of significant diagnostics from this hospitalization (including imaging, microbiology, ancillary and laboratory) are listed below for reference.     Microbiology: Recent Results (from the past 240 hour(s))  SARS Coronavirus 2 by RT PCR (hospital  order, performed in [facility] hospital lab) Nasopharyngeal Nasopharyngeal Swab     Status: Abnormal   Collection Time: 04/16/20  9:01 PM   Specimen: Nasopharyngeal Swab  Result Value Ref Range Status   SARS Coronavirus 2 POSITIVE (A) NEGATIVE Final    Comment: RESULT CALLED TO, READ BACK BY AND VERIFIED WITH: M BRAME,RN@2310  04/16/20 MKELLY (NOTE) SARS-CoV-2 target nucleic acids are DETECTED  SARS-CoV-2 RNA is generally detectable in upper respiratory specimens  during the acute phase of infection.  Positive results are indicative  of the presence of the identified virus, but do not rule out bacterial infection or co-infection with other pathogens not detected by the test.  Clinical correlation with patient history and  other diagnostic information is necessary to determine patient infection status.  The expected result is negative.  Fact Sheet for Patients:   BoilerBrush.com.cy   Fact Sheet for Healthcare Providers:   https://pope.com/    This test is not yet approved or cleared by the [place] FDA and  has been authorized for detection and/or diagnosis of SARS-CoV-2 by FDA under an Emergency Use Authorization (EUA).  This EUA will remain in effect (meaning this test  can be used) for the duration  of  the COVID-19 declaration under Section 564(b)(1) of the Act, 21 U.S.C. section 360-bbb-3(b)(1), unless the authorization is terminated or revoked sooner.  Performed at [facility], [address], [place], [place] [address]   MRSA PCR Screening     Status: None   Collection Time: 04/17/20  5:50 PM   Specimen: Nasopharyngeal  Result Value Ref Range Status   MRSA by PCR NEGATIVE NEGATIVE Final    Comment:        The GeneXpert MRSA Assay (FDA approved for NASAL specimens only), is one component of a comprehensive MRSA colonization surveillance program. It is not intended to diagnose MRSA infection nor to guide or monitor  treatment for MRSA infections. Performed at [facility], [address], [place], [place] [address]      Labs: BNP (last 3 results) No results for input(s): BNP in the last 8760 hours. Basic Metabolic Panel: Recent Labs  Lab 04/16/20 1801 04/17/20 0750 04/18/20 0540 04/19/20 0856  NA 136 140 136 136  K 3.6 3.4* 3.0* 3.8  CL 96* 104 104 104  CO2 26 24 25 25   GLUCOSE 114* 79 94 99  BUN 66* 52* 35* 22*  CREATININE 2.57* 1.63* 1.01* 0.87  CALCIUM 9.9 9.1 8.8* 9.0  MG  --  2.0 1.7 1.6*  PHOS  --   --  1.8*  --    Liver Function Tests: Recent Labs  Lab 04/16/20 1801  AST 24  ALT 16  ALKPHOS 47  BILITOT 1.0  PROT 6.7  ALBUMIN 3.1*   Recent Labs  Lab 04/16/20 1801  LIPASE 22   No results for input(s): AMMONIA in the last 168 hours. CBC: Recent Labs  Lab 04/16/20 1801 04/17/20 0750 04/18/20 0540 04/19/20 0704  WBC 3.9* 2.7* 3.0* 3.1*  NEUTROABS 2.4 1.2* 1.0* 0.9*  HGB 9.9* 8.7* 7.9* 8.4*  HCT 32.4* 28.3* 25.1* 27.0*  MCV 97.6 98.3 96.5 98.2  PLT 226 164 144* 118*   Cardiac Enzymes: No results for input(s): CKTOTAL, CKMB, CKMBINDEX, TROPONINI in the last 168 hours. BNP: Invalid input(s): POCBNP CBG: Recent Labs  Lab 04/18/20 2140 04/18/20 2342 04/19/20 0504 04/19/20 0739 04/19/20 1106  GLUCAP 149* 135* 102* 106* 118*   D-Dimer Recent Labs    04/16/20 2328  DDIMER 0.44   Hgb A1c Recent Labs    04/16/20 2328  HGBA1C 5.8*   Lipid Profile No results for input(s): CHOL, HDL, LDLCALC, TRIG, CHOLHDL, LDLDIRECT in the last 72 hours. Thyroid function studies No results for input(s): TSH, T4TOTAL, T3FREE, THYROIDAB in the last 72 hours.  Invalid input(s): FREET3 Anemia work up Recent Labs    04/16/20 2328 04/17/20 2328  VITAMINB12  --  1,487*  FOLATE  --  7.0  FERRITIN 295  --   TIBC  --  217*  IRON  --  42   Urinalysis    Component Value Date/Time   COLORURINE YELLOW 04/16/2020 1700   APPEARANCEUR CLOUDY (A) 04/16/2020 1700    APPEARANCEUR Clear 10/29/2013 1801   LABSPEC 1.015 04/16/2020 1700   LABSPEC 1.020 10/29/2013 1801   PHURINE 5.0 04/16/2020 1700   GLUCOSEU 50 (A) 04/16/2020 1700   GLUCOSEU >=500 10/29/2013 1801   HGBUR NEGATIVE 04/16/2020 1700   BILIRUBINUR NEGATIVE 04/16/2020 1700   BILIRUBINUR Negative 10/29/2013 1801   KETONESUR 5 (A) 04/16/2020 1700   PROTEINUR 30 (A) 04/16/2020 1700   NITRITE NEGATIVE 04/16/2020 1700   LEUKOCYTESUR SMALL (A) 04/16/2020 1700   LEUKOCYTESUR Negative 10/29/2013 1801  Sepsis Labs Invalid input(s): PROCALCITONIN,  WBC,  LACTICIDVEN Microbiology Recent Results (from the past 240 hour(s))  SARS Coronavirus 2 by RT PCR (hospital order, performed in [facility] hospital lab) Nasopharyngeal Nasopharyngeal Swab     Status: Abnormal   Collection Time: 04/16/20  9:01 PM   Specimen: Nasopharyngeal Swab  Result Value Ref Range Status   SARS Coronavirus 2 POSITIVE (A) NEGATIVE Final    Comment: RESULT CALLED TO, READ BACK BY AND VERIFIED WITH: M BRAME,RN@2310  04/16/20 MKELLY (NOTE) SARS-CoV-2 target nucleic acids are DETECTED  SARS-CoV-2 RNA is generally detectable in upper respiratory specimens  during the acute phase of infection.  Positive results are indicative  of the presence of the identified virus, but do not rule out bacterial infection or co-infection with other pathogens not detected by the test.  Clinical correlation with patient history and  other diagnostic information is necessary to determine patient infection status.  The expected result is negative.  Fact Sheet for Patients:   BoilerBrush.com.cy   Fact Sheet for Healthcare Providers:   https://pope.com/    This test is not yet approved or cleared by the [place] FDA and  has been authorized for detection and/or diagnosis of SARS-CoV-2 by FDA under an Emergency Use Authorization (EUA).  This EUA will remain in effect (meaning this test  can be  used) for the duration of  the COVID-19 declaration under Section 564(b)(1) of the Act, 21 U.S.C. section 360-bbb-3(b)(1), unless the authorization is terminated or revoked sooner.  Performed at [facility], [address] [place], [place] [address]   MRSA PCR Screening     Status: None   Collection Time: 04/17/20  5:50 PM   Specimen: Nasopharyngeal  Result Value Ref Range Status   MRSA by PCR NEGATIVE NEGATIVE Final    Comment:        The GeneXpert MRSA Assay (FDA approved for NASAL specimens only), is one component of a comprehensive MRSA colonization surveillance program. It is not intended to diagnose MRSA infection nor to guide or monitor treatment for MRSA infections. Performed at [facility], [address] [place], [place] [address]      Time coordinating discharge:  SIGNED:   Dorothyann Gibbs, Medical Student  [facility] 04/19/2020, 1:23 PM   If 7PM-7AM, please contact night-coverage [email or web address]

## 2020-04-19 NOTE — Progress Notes (Signed)
DNR bracelet  placed on patients right wrist.  

## 2020-04-19 NOTE — Plan of Care (Signed)

## 2020-04-26 ENCOUNTER — Ambulatory Visit (HOSPITAL_COMMUNITY): Admission: RE | Admit: 2020-04-26 | Payer: Medicare Other | Source: Ambulatory Visit

## 2020-04-26 ENCOUNTER — Encounter (HOSPITAL_COMMUNITY): Payer: Self-pay

## 2020-05-01 ENCOUNTER — Ambulatory Visit: Payer: Medicare Other | Admitting: Gastroenterology

## 2020-09-13 DEATH — deceased
